# Patient Record
Sex: Male | Born: 1975 | Race: Black or African American | Hispanic: No | Marital: Married | Smoking: Never smoker
Health system: Southern US, Community
[De-identification: ages and names within clinical notes are randomized; demographics above are authoritative.]

## PROBLEM LIST (undated history)

## (undated) DIAGNOSIS — R569 Unspecified convulsions: Secondary | ICD-10-CM

## (undated) DIAGNOSIS — K219 Gastro-esophageal reflux disease without esophagitis: Secondary | ICD-10-CM

## (undated) DIAGNOSIS — C801 Malignant (primary) neoplasm, unspecified: Secondary | ICD-10-CM

## (undated) DIAGNOSIS — F1010 Alcohol abuse, uncomplicated: Secondary | ICD-10-CM

## (undated) HISTORY — PX: COLON SURGERY: SHX602

## (undated) HISTORY — PX: NO PAST SURGERIES: SHX2092

---

## 2002-02-23 ENCOUNTER — Emergency Department (HOSPITAL_COMMUNITY): Admission: EM | Admit: 2002-02-23 | Discharge: 2002-02-23 | Payer: Self-pay | Admitting: Emergency Medicine

## 2012-05-24 ENCOUNTER — Emergency Department (HOSPITAL_BASED_OUTPATIENT_CLINIC_OR_DEPARTMENT_OTHER)
Admission: EM | Admit: 2012-05-24 | Discharge: 2012-05-24 | Disposition: A | Discharge status: Home / Self Care | Payer: Self-pay | Attending: Emergency Medicine | Admitting: Emergency Medicine

## 2012-05-24 ENCOUNTER — Encounter (HOSPITAL_BASED_OUTPATIENT_CLINIC_OR_DEPARTMENT_OTHER): Payer: Self-pay | Admitting: *Deleted

## 2012-05-24 DIAGNOSIS — K648 Other hemorrhoids: Secondary | ICD-10-CM | POA: Insufficient documentation

## 2012-05-24 DIAGNOSIS — K644 Residual hemorrhoidal skin tags: Secondary | ICD-10-CM | POA: Insufficient documentation

## 2012-05-24 MED ORDER — HYDROCORTISONE 2.5 % RE CREA: TOPICAL_CREAM | RECTAL | Status: DC

## 2012-05-24 NOTE — ED Provider Notes (Signed)
History   This chart was scribed for Joseph David Smitty Cords, MD by Sofie Rower. The patient was seen in room MH01/MH01 and the patient's care was started at 7:11PM.    CSN: [id number]  Arrival date & time 05/24/12  1847   First MD Initiated Contact with Patient 05/24/12 1911      Chief Complaint  Patient presents with  . Rectal Pain    (Consider location/radiation/quality/duration/timing/severity/associated sxs/prior treatment) The history is provided by the patient. No language interpreter was used.    Joseph Haney is a 36 y.o. male , with a hx of Hemorids ,who presents to the Emergency Department complaining of sudden, progressively worsening, rectal pain, onset six days ago,  with associated symptoms of itching and burning at the rectum. The pt reports his stools have been soft, however, he can still feel them as he experiences a bowel movement. Modifying factors include application of vick's vapor rub which intensifies the rectal pain. The pt denies any other medical problems at present. No abdominal pain.  No CP, SOB n/v/d.  No rashes on the skin.  No bleeding.    The pt does not smoke, however, he does drink alcohol.      History reviewed. No pertinent past medical history.  History reviewed. No pertinent past surgical history.  No family history on file.  History  Substance Use Topics  . Smoking status: Never Smoker   . Smokeless tobacco: Not on file  . Alcohol Use: Yes      Review of Systems  Respiratory: Negative for shortness of breath.   Gastrointestinal: Positive for rectal pain. Negative for abdominal pain and diarrhea.  Skin: Negative for rash and wound.  All other systems reviewed and are negative.    Allergies  Review of patient's allergies indicates no known allergies.  Home Medications  No current outpatient prescriptions on file.  BP 147/106  Pulse 108  Temp 98.5 F (36.9 C) (Oral)  Resp 18  SpO2 100%  Physical Exam  Nursing note and  vitals reviewed. Constitutional: He is oriented to person, place, and time. He appears well-developed and well-nourished.  HENT:  Head: Atraumatic.  Right Ear: External ear normal.  Left Ear: External ear normal.  Nose: Nose normal.  Eyes: Conjunctivae normal are normal. Pupils are equal, round, and reactive to light.  Neck: Normal range of motion.  Cardiovascular: Normal rate, regular rhythm and normal heart sounds.   Pulmonary/Chest: Effort normal and breath sounds normal.  Abdominal: Soft. Bowel sounds are normal.  Genitourinary:       3 Hemihidrosis present. Non thrombosed. 1 Internal, 2 external.   Musculoskeletal: Normal range of motion.  Neurological: He is alert and oriented to person, place, and time. He has normal reflexes.  Skin: Skin is warm and dry.  Psychiatric: He has a normal mood and affect. His behavior is normal.    ED Course  Procedures (including critical care time)  DIAGNOSTIC STUDIES: Oxygen Saturation is 100% on room air, normal by my interpretation.    COORDINATION OF CARE:    7:34PM- Application of Hemorid cream discussed with patient. Pt agrees to treatment.   Labs Reviewed - No data to display No results found.   No diagnosis found.    MDM  Hemorrhoids non thrombosed will start treatment with anusol and colace.  Follow up with your PMD patient verbalizes understanding and agrees to follow up      I personally performed the services described in this documentation, which was scribed in  my presence. The recorded information has been reviewed and considered.    Jasmine Awe, MD 05/24/12 1956

## 2012-05-24 NOTE — ED Notes (Signed)
Noted small Hemorrhoids

## 2012-05-24 NOTE — ED Notes (Addendum)
Rectal burning. States he has hemorrhoids. He was using prep H with some relief and then his friend told him to use Vicks vapor rub on them. The rectal burning started after he used the Vicks.

## 2012-07-25 ENCOUNTER — Emergency Department (HOSPITAL_COMMUNITY): Payer: Self-pay

## 2012-07-25 ENCOUNTER — Observation Stay (HOSPITAL_COMMUNITY)
Admission: EM | Admit: 2012-07-25 | Discharge: 2012-07-27 | Disposition: A | Discharge status: Home / Self Care | Payer: Self-pay | Attending: Internal Medicine | Admitting: Internal Medicine

## 2012-07-25 ENCOUNTER — Encounter (HOSPITAL_COMMUNITY): Payer: Self-pay | Admitting: Physical Medicine and Rehabilitation

## 2012-07-25 DIAGNOSIS — S01501A Unspecified open wound of lip, initial encounter: Secondary | ICD-10-CM | POA: Insufficient documentation

## 2012-07-25 DIAGNOSIS — F1110 Opioid abuse, uncomplicated: Secondary | ICD-10-CM | POA: Insufficient documentation

## 2012-07-25 DIAGNOSIS — F1020 Alcohol dependence, uncomplicated: Secondary | ICD-10-CM | POA: Insufficient documentation

## 2012-07-25 DIAGNOSIS — G40909 Epilepsy, unspecified, not intractable, without status epilepticus: Secondary | ICD-10-CM | POA: Diagnosis present

## 2012-07-25 DIAGNOSIS — M545 Low back pain, unspecified: Secondary | ICD-10-CM | POA: Insufficient documentation

## 2012-07-25 DIAGNOSIS — F17200 Nicotine dependence, unspecified, uncomplicated: Secondary | ICD-10-CM | POA: Insufficient documentation

## 2012-07-25 DIAGNOSIS — F1010 Alcohol abuse, uncomplicated: Secondary | ICD-10-CM

## 2012-07-25 DIAGNOSIS — F10939 Alcohol use, unspecified with withdrawal, unspecified: Principal | ICD-10-CM | POA: Insufficient documentation

## 2012-07-25 DIAGNOSIS — Y92009 Unspecified place in unspecified non-institutional (private) residence as the place of occurrence of the external cause: Secondary | ICD-10-CM | POA: Insufficient documentation

## 2012-07-25 DIAGNOSIS — F10239 Alcohol dependence with withdrawal, unspecified: Secondary | ICD-10-CM | POA: Insufficient documentation

## 2012-07-25 DIAGNOSIS — E876 Hypokalemia: Secondary | ICD-10-CM | POA: Insufficient documentation

## 2012-07-25 DIAGNOSIS — H1131 Conjunctival hemorrhage, right eye: Secondary | ICD-10-CM

## 2012-07-25 DIAGNOSIS — F1210 Cannabis abuse, uncomplicated: Secondary | ICD-10-CM | POA: Insufficient documentation

## 2012-07-25 DIAGNOSIS — W19XXXA Unspecified fall, initial encounter: Secondary | ICD-10-CM | POA: Insufficient documentation

## 2012-07-25 DIAGNOSIS — S0300XA Dislocation of jaw, unspecified side, initial encounter: Secondary | ICD-10-CM | POA: Diagnosis present

## 2012-07-25 DIAGNOSIS — E872 Acidosis, unspecified: Secondary | ICD-10-CM | POA: Insufficient documentation

## 2012-07-25 DIAGNOSIS — H1130 Conjunctival hemorrhage, unspecified eye: Secondary | ICD-10-CM | POA: Insufficient documentation

## 2012-07-25 DIAGNOSIS — R569 Unspecified convulsions: Secondary | ICD-10-CM | POA: Insufficient documentation

## 2012-07-25 LAB — COMPREHENSIVE METABOLIC PANEL
ALT: 84 U/L — ABNORMAL HIGH (ref 0–53)
AST: 140 U/L — ABNORMAL HIGH (ref 0–37)
Alkaline Phosphatase: 73 U/L (ref 39–117)
CO2: 25 mEq/L (ref 19–32)
Calcium: 10 mg/dL (ref 8.4–10.5)
Chloride: 95 mEq/L — ABNORMAL LOW (ref 96–112)
GFR calc Af Amer: >90 mL/min (ref >90)
GFR calc non Af Amer: >90 mL/min (ref >90)
Glucose, Bld: 201 mg/dL — ABNORMAL HIGH (ref 70–99)
Potassium: 3.5 mEq/L (ref 3.5–5.1)
Sodium: 138 mEq/L (ref 135–145)

## 2012-07-25 LAB — POCT I-STAT, CHEM 8
BUN: 6 mg/dL (ref 6–23)
Calcium, Ion: 1.17 mmol/L (ref 1.12–1.23)
Chloride: 101 mEq/L (ref 96–112)
Creatinine, Ser: 0.9 mg/dL (ref 0.50–1.35)
Glucose, Bld: 195 mg/dL — ABNORMAL HIGH (ref 70–99)
TCO2: 25 mmol/L (ref 0–100)

## 2012-07-25 LAB — URINE MICROSCOPIC-ADD ON

## 2012-07-25 LAB — CREATININE, SERUM: Creatinine, Ser: 0.71 mg/dL (ref 0.50–1.35)

## 2012-07-25 LAB — RAPID URINE DRUG SCREEN, HOSP PERFORMED
Amphetamines: NOT DETECTED
Barbiturates: NOT DETECTED
Benzodiazepines: NOT DETECTED
Cocaine: NOT DETECTED
Opiates: POSITIVE — AB
Tetrahydrocannabinol: POSITIVE — AB

## 2012-07-25 LAB — CBC
Hemoglobin: 13.1 g/dL (ref 13.0–17.0)
Hemoglobin: 12.7 g/dL — ABNORMAL LOW (ref 13.0–17.0)
MCH: 32.8 pg (ref 26.0–34.0)
MCH: 32.6 pg (ref 26.0–34.0)
MCHC: 35.3 g/dL (ref 30.0–36.0)
MCHC: 35.8 g/dL (ref 30.0–36.0)
Platelets: 142 10*3/uL — ABNORMAL LOW (ref 150–400)
RDW: 15.7 % — ABNORMAL HIGH (ref 11.5–15.5)
RDW: 15.6 % — ABNORMAL HIGH (ref 11.5–15.5)

## 2012-07-25 LAB — URINALYSIS, ROUTINE W REFLEX MICROSCOPIC
Bilirubin Urine: NEGATIVE
Glucose, UA: 100 mg/dL — AB
Specific Gravity, Urine: 1.024 (ref 1.005–1.030)
Urobilinogen, UA: 1 mg/dL (ref 0.0–1.0)

## 2012-07-25 LAB — GLUCOSE, CAPILLARY

## 2012-07-25 MED ORDER — LORAZEPAM 1 MG PO TABS
0.0000 mg | ORAL_TABLET | Freq: Two times a day (BID) | ORAL | Status: DC
  Filled 2012-07-25: qty 1

## 2012-07-25 MED ORDER — ACETAMINOPHEN 325 MG PO TABS: 650.0000 mg | ORAL_TABLET | Freq: Four times a day (QID) | ORAL | Status: DC | PRN

## 2012-07-25 MED ORDER — SODIUM CHLORIDE 0.9 % IJ SOLN
3.0000 mL | Freq: Two times a day (BID) | INTRAMUSCULAR | Status: DC
  Administered 2012-07-26 – 2012-07-27 (×3): 3 mL via INTRAVENOUS

## 2012-07-25 MED ORDER — IBUPROFEN 400 MG PO TABS
600.0000 mg | ORAL_TABLET | Freq: Once | ORAL | Status: AC
  Administered 2012-07-25: 600 mg via ORAL
  Filled 2012-07-25: qty 2

## 2012-07-25 MED ORDER — LORAZEPAM 2 MG/ML IJ SOLN: 1.0000 mg | Freq: Four times a day (QID) | INTRAMUSCULAR | Status: DC | PRN

## 2012-07-25 MED ORDER — LORAZEPAM 1 MG PO TABS
0.0000 mg | ORAL_TABLET | Freq: Four times a day (QID) | ORAL | Status: AC
  Administered 2012-07-25 – 2012-07-27 (×7): 1 mg via ORAL
  Filled 2012-07-25 (×5): qty 1

## 2012-07-25 MED ORDER — LORAZEPAM 1 MG PO TABS
1.0000 mg | ORAL_TABLET | Freq: Four times a day (QID) | ORAL | Status: DC | PRN
  Filled 2012-07-25: qty 1

## 2012-07-25 MED ORDER — IBUPROFEN 800 MG PO TABS: 800.0000 mg | ORAL_TABLET | Freq: Once | ORAL | Status: DC

## 2012-07-25 MED ORDER — THIAMINE HCL 100 MG/ML IJ SOLN: 100.0000 mg | Freq: Every day | INTRAMUSCULAR | Status: DC

## 2012-07-25 MED ORDER — FOLIC ACID 1 MG PO TABS
1.0000 mg | ORAL_TABLET | Freq: Every day | ORAL | Status: DC
  Administered 2012-07-26 – 2012-07-27 (×2): 1 mg via ORAL
  Filled 2012-07-25 (×2): qty 1

## 2012-07-25 MED ORDER — DIAZEPAM 5 MG PO TABS
5.0000 mg | ORAL_TABLET | Freq: Once | ORAL | Status: AC
  Administered 2012-07-25: 5 mg via ORAL
  Filled 2012-07-25: qty 1

## 2012-07-25 MED ORDER — ADULT MULTIVITAMIN W/MINERALS CH
1.0000 | ORAL_TABLET | Freq: Every day | ORAL | Status: DC
  Administered 2012-07-26 – 2012-07-27 (×2): 1 via ORAL
  Filled 2012-07-25 (×2): qty 1

## 2012-07-25 MED ORDER — DOCUSATE SODIUM 100 MG PO CAPS
100.0000 mg | ORAL_CAPSULE | Freq: Two times a day (BID) | ORAL | Status: DC
  Administered 2012-07-26 (×2): 100 mg via ORAL
  Filled 2012-07-25 (×5): qty 1

## 2012-07-25 MED ORDER — VITAMIN B-1 100 MG PO TABS
100.0000 mg | ORAL_TABLET | Freq: Every day | ORAL | Status: DC
  Administered 2012-07-26 – 2012-07-27 (×2): 100 mg via ORAL
  Filled 2012-07-25 (×2): qty 1

## 2012-07-25 MED ORDER — ENOXAPARIN SODIUM 40 MG/0.4ML ~~LOC~~ SOLN
40.0000 mg | SUBCUTANEOUS | Status: DC
  Administered 2012-07-25 – 2012-07-26 (×2): 40 mg via SUBCUTANEOUS
  Filled 2012-07-25 (×3): qty 0.4

## 2012-07-25 MED ORDER — HYDROCODONE-ACETAMINOPHEN 5-325 MG PO TABS
1.0000 | ORAL_TABLET | ORAL | Status: DC | PRN
  Administered 2012-07-25 – 2012-07-27 (×5): 2 via ORAL
  Filled 2012-07-25 (×5): qty 2

## 2012-07-25 MED ORDER — ACETAMINOPHEN 650 MG RE SUPP: 650.0000 mg | Freq: Four times a day (QID) | RECTAL | Status: DC | PRN

## 2012-07-25 MED ORDER — THIAMINE HCL 100 MG/ML IJ SOLN
Freq: Once | INTRAVENOUS | Status: AC
  Administered 2012-07-25: 19:00:00 via INTRAVENOUS
  Filled 2012-07-25: qty 1000

## 2012-07-25 NOTE — ED Notes (Signed)
Pt in x-ray at the time. To be transported to unit when he returns.

## 2012-07-25 NOTE — ED Notes (Signed)
Pt presents to department via [facility] for evaluation of seizure. New onset seizure. Pt took OTC sleeping medication this morning, family heard patient yelling in bedroom, was found on floor having seizure. Bruising noted to L side of face. Postictal upon arrival to ED. BP 148/110. CBG 88. 20g LAC.

## 2012-07-25 NOTE — ED Notes (Signed)
Pt's CBG was 177 when I checked it.11:01am JG.

## 2012-07-25 NOTE — ED Notes (Signed)
Pt resting quietly at the time. No seizure activity noted. Vital signs stable. Family at bedside. No signs of distress noted at the time.

## 2012-07-25 NOTE — ED Notes (Signed)
Pt transported to CT ?

## 2012-07-25 NOTE — Consult Note (Signed)
NEURO HOSPITALIST CONSULT NOTE    Reason for Consult: Generalized seizure, most likely secondary to alcohol withdrawal.  HPI:                                                                                                                                          Joseph Haney is an 36 y.o. male with no known medical illness who was brought to the emergency room following a witnessed generalized seizure at home. 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 He stopped drinking 2 days prior to admission because he ran out of money. He suffered facial trauma but no fractures. CT scan of his head showed no intracranial abnormality. Isn't a recurrent seizure activity since admission. As no previous history of seizure activity as well. Family history is positive for febrile seizures but no other indication of seizure disorders. AST and ALT were elevated. Urine drug screen was positive for THC and opiates.  History reviewed. No pertinent past medical history.  History reviewed. No pertinent past surgical history.  Family History  Problem Relation Age of Onset  . Coronary artery disease Mother   . Coronary artery disease Father   . Heart attack Father 60     Social History:  reports that he has been smoking.  He does not have any smokeless tobacco history on file. He reports that he drinks alcohol. He reports that he uses illicit drugs (Marijuana) about once per week.  No Known Allergies  MEDICATIONS:                                                                                                                     Scheduled:   . [COMPLETED] diazepam  5 mg Oral Once  . docusate sodium  100 mg Oral BID  . enoxaparin (LOVENOX) injection  40 mg Subcutaneous Q24H  . folic acid  1 mg Oral Daily  . [COMPLETED] ibuprofen  600 mg Oral Once  . LORazepam  0-4 mg Oral Q6H   Followed by  . LORazepam  0-4 mg Oral Q12H  .  multivitamin with minerals  1 tablet Oral Daily  . [COMPLETED] general admission iv infusion   Intravenous Once  . sodium chloride  3 mL Intravenous Q12H  . thiamine  100 mg Oral Daily  . [DISCONTINUED] ibuprofen  800 mg Oral Once  . [DISCONTINUED] thiamine  100 mg Intravenous Daily   ZOX:[id number], acetaminophen, HYDROcodone-acetaminophen, LORazepam, LORazepam     Blood pressure 144/96, pulse 109, temperature 98.5 F (36.9 C), temperature source Oral, resp. rate 20, height 5\' 5"  (1.651 m), weight 62.279 kg (137 lb 4.8 oz), SpO2 100.00%.   Neurologic Examination:                                                                                                      Mental Status: Alert, oriented, thought content appropriate.  Speech fluent without evidence of aphasia. Able to follow commands without difficulty. Cranial Nerves: II-Visual fields were normal. III/IV/VI-Pupils were equal and reacted. Extraocular movements were full and conjugate.    V/VII-no facial numbness and no facial weakness. VIII-normal. X-normal speech and symmetrical palatal movement. XII-midline tongue extension Motor: 5/5 bilaterally with normal tone and bulk Sensory: Normal throughout. Deep Tendon Reflexes: 1+ and symmetric. Plantars: Flexor bilaterally Cerebellar: Normal finger-to-nose testing.  No results found for this basename: cbc, bmp, coags, chol, tri, ldl, hga1c    Results for orders placed during the hospital encounter of 07/25/12 (from the past 48 hour(s))  LACTIC ACID, PLASMA     Status: Abnormal   Collection Time   07/25/12 10:40 AM      Component Value Range Comment   Lactic Acid, Venous 6.5 (*) 0.5 - 2.2 mmol/L   URINALYSIS, ROUTINE W REFLEX MICROSCOPIC     Status: Abnormal   Collection Time   07/25/12 11:00 AM      Component Value Range Comment   Color, Urine AMBER (*) YELLOW BIOCHEMICALS MAY BE AFFECTED BY COLOR   APPearance CLOUDY (*) CLEAR    Specific Gravity, Urine 1.024   1.005 - 1.030    pH 6.5  5.0 - 8.0    Glucose, UA 100 (*) NEGATIVE mg/dL    Hgb urine dipstick SMALL (*) NEGATIVE    Bilirubin Urine NEGATIVE  NEGATIVE    Ketones, ur 15 (*) NEGATIVE mg/dL    Protein, ur >147 (*) NEGATIVE mg/dL    Urobilinogen, UA 1.0  0.0 - 1.0 mg/dL    Nitrite NEGATIVE  NEGATIVE    Leukocytes, UA NEGATIVE  NEGATIVE   URINE RAPID DRUG SCREEN (HOSP PERFORMED)     Status: Abnormal   Collection Time   07/25/12 11:00 AM      Component Value Range Comment   Opiates POSITIVE (*) NONE DETECTED    Cocaine NONE DETECTED  NONE DETECTED    Benzodiazepines NONE DETECTED  NONE DETECTED    Amphetamines NONE DETECTED  NONE DETECTED    Tetrahydrocannabinol POSITIVE (*) NONE DETECTED    Barbiturates NONE DETECTED  NONE DETECTED   URINE MICROSCOPIC-ADD ON     Status: Abnormal   Collection Time   07/25/12 11:00 AM      Component Value Range Comment   Squamous Epithelial / LPF RARE  RARE    RBC / HPF 3-6  <3 RBC/hpf  Casts HYALINE CASTS (*) NEGATIVE GRANULAR CAST   Urine-Other MUCOUS PRESENT     GLUCOSE, CAPILLARY     Status: Abnormal   Collection Time   07/25/12 11:02 AM      Component Value Range Comment   Glucose-Capillary 177 (*) 70 - 99 mg/dL    Comment 1 Notify RN     POCT I-STAT, CHEM 8     Status: Abnormal   Collection Time   07/25/12 11:07 AM      Component Value Range Comment   Sodium 138  135 - 145 mEq/L    Potassium 3.6  3.5 - 5.1 mEq/L    Chloride 101  96 - 112 mEq/L    BUN 6  6 - 23 mg/dL    Creatinine, Ser 1.47  0.50 - 1.35 mg/dL    Glucose, Bld 829 (*) 70 - 99 mg/dL    Calcium, Ion 5.62  1.30 - 1.23 mmol/L    TCO2 25  0 - 100 mmol/L    Hemoglobin 12.9 (*) 13.0 - 17.0 g/dL    HCT 86.5 (*) 78.4 - 52.0 %   COMPREHENSIVE METABOLIC PANEL     Status: Abnormal   Collection Time   07/25/12 12:58 PM      Component Value Range Comment   Sodium 138  135 - 145 mEq/L    Potassium 3.5  3.5 - 5.1 mEq/L    Chloride 95 (*) 96 - 112 mEq/L    CO2 25  19 - 32 mEq/L     Glucose, Bld 201 (*) 70 - 99 mg/dL    BUN 7  6 - 23 mg/dL    Creatinine, Ser 6.96  0.50 - 1.35 mg/dL    Calcium 29.5  8.4 - 10.5 mg/dL    Total Protein 8.4 (*) 6.0 - 8.3 g/dL    Albumin 4.4  3.5 - 5.2 g/dL    AST 284 (*) 0 - 37 U/L    ALT 84 (*) 0 - 53 U/L    Alkaline Phosphatase 73  39 - 117 U/L    Total Bilirubin 0.8  0.3 - 1.2 mg/dL    GFR calc non Af Amer >90  >90 mL/min    GFR calc Af Amer >90  >90 mL/min   CBC     Status: Abnormal   Collection Time   07/25/12 12:58 PM      Component Value Range Comment   WBC 5.6  4.0 - 10.5 K/uL    RBC 4.00 (*) 4.22 - 5.81 MIL/uL    Hemoglobin 13.1  13.0 - 17.0 g/dL    HCT 13.2 (*) 44.0 - 52.0 %    MCV 92.8  78.0 - 100.0 fL    MCH 32.8  26.0 - 34.0 pg    MCHC 35.3  30.0 - 36.0 g/dL    RDW 10.2 (*) 72.5 - 15.5 %    Platelets 151  150 - 400 K/uL   SALICYLATE LEVEL     Status: Abnormal   Collection Time   07/25/12  3:23 PM      Component Value Range Comment   Salicylate Lvl <2.0 (*) 2.8 - 20.0 mg/dL   CBC     Status: Abnormal   Collection Time   07/25/12  5:04 PM      Component Value Range Comment   WBC 7.5  4.0 - 10.5 K/uL    RBC 3.89 (*) 4.22 - 5.81 MIL/uL    Hemoglobin 12.7 (*) 13.0 - 17.0  g/dL    HCT 09.8 (*) 11.9 - 52.0 %    MCV 91.3  78.0 - 100.0 fL    MCH 32.6  26.0 - 34.0 pg    MCHC 35.8  30.0 - 36.0 g/dL    RDW 14.7 (*) 82.9 - 15.5 %    Platelets 142 (*) 150 - 400 K/uL   CREATININE, SERUM     Status: Normal   Collection Time   07/25/12  5:04 PM      Component Value Range Comment   Creatinine, Ser 0.71  0.50 - 1.35 mg/dL    GFR calc non Af Amer >90  >90 mL/min    GFR calc Af Amer >90  >90 mL/min     Dg Thoracic Spine 2 View  07/25/2012  *RADIOLOGY REPORT*  Clinical Data: Seizure with back pain.  THORACIC SPINE - 2 VIEW  Comparison:  None  Findings:  There is no evidence of thoracic spine fracture. Alignment is normal.  No other significant bone abnormalities are identified.  IMPRESSION: Negative.   Original Report  Authenticated By: Irish Lack, M.D.    Dg Lumbar Spine Complete  07/25/2012  *RADIOLOGY REPORT*  Clinical Data: Seizure, fall and low back pain.  LUMBAR SPINE - COMPLETE 4+ VIEW  Comparison: None.  Findings: No acute fracture or subluxation is identified.  No significant degenerative changes.  No bony lesions.  No soft tissue abnormalities.  IMPRESSION: Normal lumbar spine.   Original Report Authenticated By: Irish Lack, M.D.    Dg Pelvis 1-2 Views  07/25/2012  *RADIOLOGY REPORT*  Clinical Data: Fall with pain and low back/pelvis.  PELVIS - 1-2 VIEW  Comparison: None.  Findings: 2 AP views the pelvis. Femoral heads are located. Prominent acetabular osteophytes involve the weightbearing surface of the hips bilaterally. Sacroiliac joints are symmetric.  No acute fracture.  IMPRESSION: No acute osseous abnormality.   Original Report Authenticated By: Jeronimo Greaves, M.D.    Ct Head Wo Contrast  07/25/2012  *RADIOLOGY REPORT*  Clinical Data:  Seizure and left facial bruising.  CT HEAD WITHOUT CONTRAST CT MAXILLOFACIAL WITHOUT CONTRAST  Technique:  Multidetector CT imaging of the head and maxillofacial structures were performed using the standard protocol without intravenous contrast. Multiplanar CT image reconstructions of the maxillofacial structures were also generated.  Comparison:   None.  CT HEAD  Findings: The brain demonstrates no evidence of hemorrhage, infarction, edema, mass effect, extra-axial fluid collection, hydrocephalus or mass lesion.  The skull is unremarkable.  IMPRESSION: Normal head CT.  CT MAXILLOFACIAL  Findings:   No facial fractures are identified.  There is relative anterior positioning of the mandibular condyles at the level of the TMJ joints, especially on the left.  Correlation suggested with any pain or limitation of movement of the mandible.  No soft tissue abnormalities identified.  Paranasal sinuses and visualized mastoid air cells are normally aerated.  No evidence of  soft tissue hematoma or foreign body.  The visualized airway is normally patent.  IMPRESSION: No acute maxillofacial fractures are identified.  There is some suggestion of anterior subluxation of both mandibular condyles at the level of the temporomandibular joints.  This is more prominent on the left.  Recommend correlation with physical exam and range of motion.   Original Report Authenticated By: Irish Lack, M.D.    Ct Maxillofacial Wo Cm  07/25/2012  *RADIOLOGY REPORT*  Clinical Data:  Seizure and left facial bruising.  CT HEAD WITHOUT CONTRAST CT MAXILLOFACIAL WITHOUT CONTRAST  Technique:  Multidetector  CT imaging of the head and maxillofacial structures were performed using the standard protocol without intravenous contrast. Multiplanar CT image reconstructions of the maxillofacial structures were also generated.  Comparison:   None.  CT HEAD  Findings: The brain demonstrates no evidence of hemorrhage, infarction, edema, mass effect, extra-axial fluid collection, hydrocephalus or mass lesion.  The skull is unremarkable.  IMPRESSION: Normal head CT.  CT MAXILLOFACIAL  Findings:   No facial fractures are identified.  There is relative anterior positioning of the mandibular condyles at the level of the TMJ joints, especially on the left.  Correlation suggested with any pain or limitation of movement of the mandible.  No soft tissue abnormalities identified.  Paranasal sinuses and visualized mastoid air cells are normally aerated.  No evidence of soft tissue hematoma or foreign body.  The visualized airway is normally patent.  IMPRESSION: No acute maxillofacial fractures are identified.  There is some suggestion of anterior subluxation of both mandibular condyles at the level of the temporomandibular joints.  This is more prominent on the left.  Recommend correlation with physical exam and range of motion.   Original Report Authenticated By: Irish Lack, M.D.      Assessment/Plan: Generalized  seizure times one, most likely secondary to alcohol withdrawal. Patient's neurological evaluation at this point was unremarkable with no focal abnormalities and no mental status abnormalities. CT scan showed no signs of acute intracranial abnormality.  Recommendations: 1. Alcohol withdrawal precautions with use of benzodiazepines as necessary. 2. Routine EEG 3. No anticonvulsant medication unless patient has a recurrent witnessed seizure, or EEG shows indications of primary seizure disorder. 4. Alcohol rehabilitation program. 5. No further neurological intervention is indicated if patient does not have a recurrent seizure an EEG is unremarkable.  Venetia Maxon M.D. [facility] [phone]  07/25/2012, 8:52 PM

## 2012-07-25 NOTE — H&P (Signed)
Hospital Admission Note Date: 07/25/2012  Patient name: Joseph Haney Medical record number: [id number] Date of birth: [birth year] Age: 36 y.o. Gender: male PCP: No primary provider on file.  Medical Service: Internal Medicine Teaching Service  Attending physician:  Dr. Dalphine Handing    1st Contact:  Dr. Sherrine Maples   Pager: [phone] 2nd Contact:  Dr. Bosie Clos   Pager: [phone] After 5 pm or weekends: 1st Contact:      Pager: [phone] 2nd Contact:      Pager: [phone]  Chief Complaint: Seizures  History of Present Illness: Pt is a 36yo M w/ PMH EtOH abuse, drinking 1 pint of gin and a 40oz beer daily, who quit drinking 2 days ago because he ran out of money, presents to the ED with new onset seizures. He was postictal on arrival to the ED, which has improved since his arrival, and he was alert on exam. The pt reports that he took some OTC sleeping medication around 9am, and about 1 hour later he made a loud grunt, and his fiance found him face down on the floor seizing. EMS was called and transported the pt to the ED. The the last things he remembers before waking up with EMS over him at home was getting out of bed to walk into the living room. On arrival to the ED bruises were noted to the left side of his face as well as a right lateral subconjunctival hemorrhage, and small laceration above his mid-right lip.   Meds: Current Outpatient Rx  Name  Route  Sig  Dispense  Refill  . DOCUSATE SODIUM 100 MG PO CAPS   Oral   Take 100 mg by mouth daily.           Allergies: Allergies as of 07/25/2012  . (No Known Allergies)   No past medical history on file. No past surgical history on file. History reviewed. No pertinent family history. History   Social History  . Marital Status: Single    Spouse Name: N/A    Number of Children: N/A  . Years of Education: N/A   Occupational History  . Not on file.   Social History Main Topics  . Smoking status: Current Every Day Smoker    Types:  Cigarettes  . Smokeless tobacco: Not on file  . Alcohol Use: Yes  . Drug Use: No  . Sexually Active:    Other Topics Concern  . Not on file   Social History Narrative  . No narrative on file    Review of Systems: Constitutional: + diaphoresis. Denies fever, chills, appetite change or fatigue.  HEENT: Denies photophobia, hearing loss, ear pain, congestion, sore throat, rhinorrhea, sneezing, mouth sores.  Respiratory: Denies SOB, DOE, cough, chest tightness, or wheezing.  Cardiovascular: Denies chest pain, palpitations or leg swelling.  Gastrointestinal: Denies nausea, vomiting, abdominal pain, diarrhea, constipation. Genitourinary: Denies dysuria, urgency, frequency, flank pain or difficulty urinating.  Musculoskeletal: Denies myalgias, back pain, joint swelling, arthralgias or gait problem.  Skin: + bruise to left forehead.  Neurological: + seizure x1. Denies dizziness, light-headedness, numbness or headaches.  Psychiatric/Behavioral: Denies suicidal ideation, mood changes, confusion, nervousness, sleep disturbance or agitation   Physical Exam: Blood pressure 126/90, pulse 91, temperature 97.9 F (36.6 C), temperature source Oral, resp. rate 18, SpO2 97.00%. General: Well-developed, and cooperative on examination.  Head: Ecchymosis to left forehead, mild TTP at left temporomandibular joint. Normocephalic.  Eyes: Right lateral subconjunctival hemorrhage. EOMI. Mouth: Small laceration above upper lip at the mid-right side. Pharynx  pink and moist, no erythema or exudates.  Neck: Supple, full ROM Lungs: CTAB, normal respiratory effort, no accessory muscle use, no crackles, and no wheezes. Heart: Regular rate, regular rhythm, no murmur, no gallop, and no rub.  Abdomen: Soft, non-tender, non-distended Msk: No joint swelling, warmth, or erythema.  Extremities: 2+ radial and DP pulses bilaterally. No cyanosis, clubbing, edema Neurologic: Alert & oriented X3, cranial nerves II-XII  intact, strength normal in all extremities, sensation intact to light touch, and gait normal.  Skin: Turgor normal and no rashes.  Psych: Normally interactive, good eye contact, not anxious appearing, and not depressed appearing.   Lab results: Basic Metabolic Panel:  Basename 07/25/12 1107  NA 138  K 3.6  CL 101  CO2 --  GLUCOSE 195*  BUN 6  CREATININE 0.90  CALCIUM --  MG --  PHOS --   Liver Function Tests: No results found for this basename: AST:2,ALT:2,ALKPHOS:2,BILITOT:2,PROT:2,ALBUMIN:2 in the last 72 hours No results found for this basename: LIPASE:2,AMYLASE:2 in the last 72 hours No results found for this basename: AMMONIA:2 in the last 72 hours CBC:  Basename 07/25/12 1107  WBC --  NEUTROABS --  HGB 12.9*  HCT 38.0*  MCV --  PLT --   Cardiac Enzymes: No results found for this basename: CKTOTAL:3,CKMB:3,CKMBINDEX:3,TROPONINI:3 in the last 72 hours BNP: No results found for this basename: PROBNP:3 in the last 72 hours D-Dimer: No results found for this basename: DDIMER:2 in the last 72 hours CBG:  Basename 07/25/12 1102  GLUCAP 177*   Hemoglobin A1C: No results found for this basename: HGBA1C in the last 72 hours Fasting Lipid Panel: No results found for this basename: CHOL,HDL,LDLCALC,TRIG,CHOLHDL,LDLDIRECT in the last 72 hours Thyroid Function Tests: No results found for this basename: TSH,T4TOTAL,FREET4,T3FREE,THYROIDAB in the last 72 hours Anemia Panel: No results found for this basename: VITAMINB12,FOLATE,FERRITIN,TIBC,IRON,RETICCTPCT in the last 72 hours Coagulation: No results found for this basename: LABPROT:2,INR:2 in the last 72 hours Urine Drug Screen: Drugs of Abuse     Component Value Date/Time   LABOPIA POSITIVE* 07/25/2012 1100   COCAINSCRNUR NONE DETECTED 07/25/2012 1100   LABBENZ NONE DETECTED 07/25/2012 1100   AMPHETMU NONE DETECTED 07/25/2012 1100   THCU POSITIVE* 07/25/2012 1100   LABBARB NONE DETECTED 07/25/2012 1100      Alcohol Level: No results found for this basename: ETH:2 in the last 72 hours Urinalysis:  Basename 07/25/12 1100  COLORURINE AMBER*  LABSPEC 1.024  PHURINE 6.5  GLUCOSEU 100*  HGBUR SMALL*  BILIRUBINUR NEGATIVE  KETONESUR 15*  PROTEINUR >300*  UROBILINOGEN 1.0  NITRITE NEGATIVE  LEUKOCYTESUR NEGATIVE   Imaging results:  Dg Pelvis 1-2 Views  07/25/2012  *RADIOLOGY REPORT*  Clinical Data: Fall with pain and low back/pelvis.  PELVIS - 1-2 VIEW  Comparison: None.  Findings: 2 AP views the pelvis. Femoral heads are located. Prominent acetabular osteophytes involve the weightbearing surface of the hips bilaterally. Sacroiliac joints are symmetric.  No acute fracture.  IMPRESSION: No acute osseous abnormality.   Original Report Authenticated By: Jeronimo Greaves, M.D.    Ct Head Wo Contrast  07/25/2012  *RADIOLOGY REPORT*  Clinical Data:  Seizure and left facial bruising.  CT HEAD WITHOUT CONTRAST CT MAXILLOFACIAL WITHOUT CONTRAST  Technique:  Multidetector CT imaging of the head and maxillofacial structures were performed using the standard protocol without intravenous contrast. Multiplanar CT image reconstructions of the maxillofacial structures were also generated.  Comparison:   None.  CT HEAD  Findings: The brain demonstrates no evidence of hemorrhage, infarction,  edema, mass effect, extra-axial fluid collection, hydrocephalus or mass lesion.  The skull is unremarkable.  IMPRESSION: Normal head CT.  CT MAXILLOFACIAL  Findings:   No facial fractures are identified.  There is relative anterior positioning of the mandibular condyles at the level of the TMJ joints, especially on the left.  Correlation suggested with any pain or limitation of movement of the mandible.  No soft tissue abnormalities identified.  Paranasal sinuses and visualized mastoid air cells are normally aerated.  No evidence of soft tissue hematoma or foreign body.  The visualized airway is normally patent.  IMPRESSION: No acute  maxillofacial fractures are identified.  There is some suggestion of anterior subluxation of both mandibular condyles at the level of the temporomandibular joints.  This is more prominent on the left.  Recommend correlation with physical exam and range of motion.   Original Report Authenticated By: Irish Lack, M.D.    Ct Maxillofacial Wo Cm  07/25/2012  *RADIOLOGY REPORT*  Clinical Data:  Seizure and left facial bruising.  CT HEAD WITHOUT CONTRAST CT MAXILLOFACIAL WITHOUT CONTRAST  Technique:  Multidetector CT imaging of the head and maxillofacial structures were performed using the standard protocol without intravenous contrast. Multiplanar CT image reconstructions of the maxillofacial structures were also generated.  Comparison:   None.  CT HEAD  Findings: The brain demonstrates no evidence of hemorrhage, infarction, edema, mass effect, extra-axial fluid collection, hydrocephalus or mass lesion.  The skull is unremarkable.  IMPRESSION: Normal head CT.  CT MAXILLOFACIAL  Findings:   No facial fractures are identified.  There is relative anterior positioning of the mandibular condyles at the level of the TMJ joints, especially on the left.  Correlation suggested with any pain or limitation of movement of the mandible.  No soft tissue abnormalities identified.  Paranasal sinuses and visualized mastoid air cells are normally aerated.  No evidence of soft tissue hematoma or foreign body.  The visualized airway is normally patent.  IMPRESSION: No acute maxillofacial fractures are identified.  There is some suggestion of anterior subluxation of both mandibular condyles at the level of the temporomandibular joints.  This is more prominent on the left.  Recommend correlation with physical exam and range of motion.   Original Report Authenticated By: Irish Lack, M.D.     Other results: EKG: normal sinus rhythm   Assessment & Plan by Problem: 36 yo male with history significant for alcohol abuse and  without known seizure disorder admitted with new-onset seizure .  1. New-onset seizure: likely secondary to alcohol withdrawal given history of alcohol abuse and recent abrupt cessation, UDS positive for marijuana and opiates. No electrolyte abnormalities on iSTAT chem8; epilepsy (less likely given new onset), hypoglycemia  less likely given glucose level 177 on admission, CT of head without evidence of space-occupying lesion or stroke. -will check CBC for leukocytosis -check CMET for evidence of uremia -monitor on telemetry for possible cardiac arrhythmia -consult Neurology for further evaluation -consider EEG  Lab 07/25/12 1258  WBC 5.6    2. Metabolic acidosis with increased anion gap:  AG of 18 with Delta ration of 0.3 suggesting hyperchloremic normal anion gap acidosis, likely secondary to lactic acidemia in setting of acute seizure, alcoholic ketoacidosis likely concurrent component -will continue to monitor  CMP     Component Value Date/Time   NA 138 07/25/2012 1258   K 3.5 07/25/2012 1258   CL 95* 07/25/2012 1258   CO2 25 07/25/2012 1258   GLUCOSE 201* 07/25/2012 1258   BUN  7 07/25/2012 1258   CREATININE 0.80 07/25/2012 1258   CALCIUM 10.0 07/25/2012 1258   PROT 8.4* 07/25/2012 1258   ALBUMIN 4.4 07/25/2012 1258   AST 140* 07/25/2012 1258   ALT 84* 07/25/2012 1258   ALKPHOS 73 07/25/2012 1258   BILITOT 0.8 07/25/2012 1258   GFRNONAA >90 07/25/2012 1258   GFRAA >90 07/25/2012 1258      3. Right subconjunctiva hematoma: Secondary to trauma from seizure -no treatment needed  4. Facial contusion: CT of maxillary without acute facial fractures, possible anterior subluxation of both mandibular condyles at the level of the tempor-mandibular joints.  -prn Vicodin  5. Alcohol Abuse: received Valium 5 mg oral x1, pt requesting assistance with alcohol abuse, family supportive and at bedside -CIWA protocol with prn Ativan -consult Social Work  6. DVT ppx:  Lovenox    Dispo: Disposition is deferred at this time, awaiting improvement of current medical problems. Anticipated discharge in approximately 1-2 day(s).   The patient does not have a current PCP (No primary provider on file.), therefore will be followed by [facility].  The patient does not have transportation limitations that hinder transportation to clinic appointments.  Signed: Genelle Gather 07/25/2012, 12:56 PM

## 2012-07-26 ENCOUNTER — Observation Stay (HOSPITAL_COMMUNITY): Payer: Self-pay

## 2012-07-26 LAB — BASIC METABOLIC PANEL
BUN: 7 mg/dL (ref 6–23)
CO2: 24 mEq/L (ref 19–32)
Calcium: 9.2 mg/dL (ref 8.4–10.5)
Chloride: 99 mEq/L (ref 96–112)
Creatinine, Ser: 0.76 mg/dL (ref 0.50–1.35)
Creatinine, Ser: 0.71 mg/dL (ref 0.50–1.35)
GFR calc non Af Amer: >90 mL/min (ref >90)
Glucose, Bld: 94 mg/dL (ref 70–99)
Glucose, Bld: 91 mg/dL (ref 70–99)
Potassium: 3.1 mEq/L — ABNORMAL LOW (ref 3.5–5.1)

## 2012-07-26 LAB — HIV ANTIBODY (ROUTINE TESTING W REFLEX): HIV: NONREACTIVE

## 2012-07-26 LAB — CBC
Hemoglobin: 11.2 g/dL — ABNORMAL LOW (ref 13.0–17.0)
MCH: 32 pg (ref 26.0–34.0)
MCHC: 34.7 g/dL (ref 30.0–36.0)
RDW: 15.6 % — ABNORMAL HIGH (ref 11.5–15.5)

## 2012-07-26 MED ORDER — POTASSIUM CHLORIDE CRYS ER 20 MEQ PO TBCR
40.0000 meq | EXTENDED_RELEASE_TABLET | Freq: Once | ORAL | Status: AC
  Administered 2012-07-26: 40 meq via ORAL
  Filled 2012-07-26: qty 2

## 2012-07-26 MED ORDER — THIAMINE HCL 100 MG PO TABS: 100.0000 mg | ORAL_TABLET | Freq: Every day | ORAL | Status: DC

## 2012-07-26 MED ORDER — FOLIC ACID 1 MG PO TABS: 1.0000 mg | ORAL_TABLET | Freq: Every day | ORAL | Status: DC

## 2012-07-26 MED ORDER — ADULT MULTIVITAMIN W/MINERALS CH: 1.0000 | ORAL_TABLET | Freq: Every day | ORAL | Status: DC

## 2012-07-26 MED ORDER — MENTHOL 3 MG MT LOZG
1.0000 | LOZENGE | OROMUCOSAL | Status: DC | PRN
  Administered 2012-07-26 (×2): 3 mg via ORAL
  Filled 2012-07-26: qty 9

## 2012-07-26 NOTE — Progress Notes (Signed)
INITIAL ADULT NUTRITION ASSESSMENT Date: 07/26/2012   Time: 1:12 PM Reason for Assessment: MST (Malnutrition Screening Tool)  INTERVENTION: 1. Agree with regular diet. Expect improved intake with decreased Etoh  2. RD will continue to follow     DOCUMENTATION CODES Per approved criteria  -Not Applicable    ASSESSMENT: Male 36 y.o.  Dx: Alcohol withdrawal seizure  Hx: History reviewed. No pertinent past medical history.  History reviewed. No pertinent past surgical history.   Related Meds:     . docusate sodium  100 mg Oral BID  . enoxaparin (LOVENOX) injection  40 mg Subcutaneous Q24H  . folic acid  1 mg Oral Daily  . [COMPLETED] ibuprofen  600 mg Oral Once  . LORazepam  0-4 mg Oral Q6H   Followed by  . LORazepam  0-4 mg Oral Q12H  . multivitamin with minerals  1 tablet Oral Daily  . [COMPLETED] potassium chloride  40 mEq Oral Once  . [COMPLETED] general admission iv infusion   Intravenous Once  . sodium chloride  3 mL Intravenous Q12H  . thiamine  100 mg Oral Daily  . [DISCONTINUED] ibuprofen  800 mg Oral Once  . [DISCONTINUED] thiamine  100 mg Intravenous Daily     Ht: 5\' 5"  (165.1 cm)  Wt: 130 lb 8 oz (59.194 kg)  Ideal Wt: 62 kg  % Ideal Wt: 95%  Usual Wt: 160 lbs per pt report, in the past year % Usual Wt: 81%  Body mass index is 21.72 kg/(m^2). WNL   Food/Nutrition Related Hx: Pt reports decreased po intake and increased Etoh intake. Lost 30 lb in the past year.   Labs:  CMP     Component Value Date/Time   NA 137 07/26/2012 0650   K 3.1* 07/26/2012 0650   CL 99 07/26/2012 0650   CO2 26 07/26/2012 0650   GLUCOSE 94 07/26/2012 0650   BUN 7 07/26/2012 0650   CREATININE 0.76 07/26/2012 0650   CALCIUM 9.0 07/26/2012 0650   PROT 8.4* 07/25/2012 1258   ALBUMIN 4.4 07/25/2012 1258   AST 140* 07/25/2012 1258   ALT 84* 07/25/2012 1258   ALKPHOS 73 07/25/2012 1258   BILITOT 0.8 07/25/2012 1258   GFRNONAA >90 07/26/2012 0650   GFRAA >90  07/26/2012 0650   Sodium  Date/Time Value Range Status  07/26/2012  6:50 AM 137  135 - 145 mEq/L Final  07/25/2012 12:58 PM 138  135 - 145 mEq/L Final  07/25/2012 11:07 AM 138  135 - 145 mEq/L Final    Potassium  Date/Time Value Range Status  07/26/2012  6:50 AM 3.1* 3.5 - 5.1 mEq/L Final  07/25/2012 12:58 PM 3.5  3.5 - 5.1 mEq/L Final  07/25/2012 11:07 AM 3.6  3.5 - 5.1 mEq/L Final    No results found for this basename: phos    No results found for this basename: mg        Intake/Output Summary (Last 24 hours) at 07/26/12 1315 Last data filed at 07/26/12 0800  Gross per 24 hour  Intake 1168.75 ml  Output      0 ml  Net 1168.75 ml      Diet Order: General  Supplements/Tube Feeding: none   IVF:    Estimated Nutritional Needs:   Kcal: 1900-2100 Protein: 60-70 gm  Fluid: 1.9-2.1 L    Pt reports that he has been only eating a few bites of foods and drinking more then one pint of Etoh daily.  Pt states he has lost  30 lbs (19% body weight) in the past year. Not significant.  States that he has not been able to eat yet today r/t a sore throat.   Expect improved po intake with decrease Etoh intake. Current weight is appropriate for pt height. Recommend weight maintenance.   Pt with weight loss and increased Etoh, pt is at increased risk for refeeding syndrome. Recommend monitoring Mag and Phos x 3 days.   NUTRITION DIAGNOSIS: Inadequate oral intake related to alcohol abuse as evidenced by weight loss.    MONITORING/EVALUATION(Goals): Goal: PO intake to meet >/=90% estimated nutrition needs  Monitor: PO intake, weight, labs, I/O's  EDUCATION NEEDS: -No education needs identified at this time   Clarene Duke RD, LDN Pager [phone] After Hours pager [phone]  07/26/2012, 1:12 PM

## 2012-07-26 NOTE — Progress Notes (Signed)
Subjective: No further seizures noted.  Awake and alert.  Continues to report no prior history of seizures.  Objective: Current vital signs: BP 131/87  Pulse 99  Temp 97.7 F (36.5 C) (Oral)  Resp 18  Ht 5\' 5"  (1.651 m)  Wt 59.194 kg (130 lb 8 oz)  BMI 21.72 kg/m2  SpO2 100% Vital signs in last 24 hours: Temp:  [97.7 F (36.5 C)-98.8 F (37.1 C)] 97.7 F (36.5 C) (11/25 0500) Pulse Rate:  [63-111] 99  (11/25 0800) Resp:  [18-20] 18  (11/25 0500) BP: (126-144)/(85-97) 131/87 mmHg (11/25 0500) SpO2:  [96 %-100 %] 100 % (11/25 0500) Weight:  [59.194 kg (130 lb 8 oz)-62.279 kg (137 lb 4.8 oz)] 59.194 kg (130 lb 8 oz) (11/25 0500)  Intake/Output from previous day: 11/24 0701 - 11/25 0700 In: 1168.8 [I.V.:1168.8] Out: -  Intake/Output this shift:   Nutritional status: NPO  Neurologic Exam: Mental Status: Alert, oriented, thought content appropriate.  Speech fluent without evidence of aphasia.  Able to follow 3 step commands without difficulty. Cranial Nerves: II: Discs flat bilaterally; Visual fields grossly normal, pupils equal, round, reactive to light and accommodation III,IV, VI: ptosis not present, extra-ocular motions intact bilaterally V,VII: smile symmetric, facial light touch sensation normal bilaterally VIII: hearing normal bilaterally IX,X: gag reflex present XI: bilateral shoulder shrug XII: midline tongue extension.  Tongue bite on left side of tongue Motor: Right : Upper extremity   5/5    Left:     Upper extremity   5/5  Lower extremity   5/5     Lower extremity   5/5 Tone and bulk:normal tone throughout; no atrophy noted.  Tremulous. Sensory: Pinprick and light touch intact throughout, bilaterally Deep Tendon Reflexes: 1+ and symmetric  Plantars: Right: downgoing   Left: downgoing Cerebellar: normal finger-to-nose and normal heel-to-shin test Gait: normal gait and station CV: pulses palpable throughout     Lab Results: Basic Metabolic  Panel:  Lab 07/26/12 0650 07/25/12 1704 07/25/12 1258 07/25/12 1107  NA 137 -- 138 138  K 3.1* -- 3.5 3.6  CL 99 -- 95* 101  CO2 26 -- 25 --  GLUCOSE 94 -- 201* 195*  BUN 7 -- 7 6  CREATININE 0.76 0.71 0.80 0.90  CALCIUM 9.0 -- 10.0 --  MG -- -- -- --  PHOS -- -- -- --    Liver Function Tests:  Lab 07/25/12 1258  AST 140*  ALT 84*  ALKPHOS 73  BILITOT 0.8  PROT 8.4*  ALBUMIN 4.4   No results found for this basename: LIPASE:5,AMYLASE:5 in the last 168 hours No results found for this basename: AMMONIA:3 in the last 168 hours  CBC:  Lab 07/26/12 0650 07/25/12 1704 07/25/12 1258 07/25/12 1107  WBC 4.4 7.5 5.6 --  NEUTROABS -- -- -- --  HGB 11.2* 12.7* 13.1 12.9*  HCT 32.3* 35.5* 37.1* 38.0*  MCV 92.3 91.3 92.8 --  PLT 129* 142* 151 --    Cardiac Enzymes: No results found for this basename: CKTOTAL:5,CKMB:5,CKMBINDEX:5,TROPONINI:5 in the last 168 hours  Lipid Panel: No results found for this basename: CHOL:5,TRIG:5,HDL:5,CHOLHDL:5,VLDL:5,LDLCALC:5 in the last 168 hours  CBG:  Lab 07/25/12 1102  GLUCAP 177*    Microbiology: No results found for this or any previous visit.  Coagulation Studies: No results found for this basename: LABPROT:5,INR:5 in the last 72 hours  Imaging: Dg Thoracic Spine 2 View  07/25/2012  *RADIOLOGY REPORT*  Clinical Data: Seizure with back pain.  THORACIC SPINE - 2  VIEW  Comparison:  None  Findings:  There is no evidence of thoracic spine fracture. Alignment is normal.  No other significant bone abnormalities are identified.  IMPRESSION: Negative.   Original Report Authenticated By: Irish Lack, M.D.    Dg Lumbar Spine Complete  07/25/2012  *RADIOLOGY REPORT*  Clinical Data: Seizure, fall and low back pain.  LUMBAR SPINE - COMPLETE 4+ VIEW  Comparison: None.  Findings: No acute fracture or subluxation is identified.  No significant degenerative changes.  No bony lesions.  No soft tissue abnormalities.  IMPRESSION: Normal lumbar  spine.   Original Report Authenticated By: Irish Lack, M.D.    Dg Pelvis 1-2 Views  07/25/2012  *RADIOLOGY REPORT*  Clinical Data: Fall with pain and low back/pelvis.  PELVIS - 1-2 VIEW  Comparison: None.  Findings: 2 AP views the pelvis. Femoral heads are located. Prominent acetabular osteophytes involve the weightbearing surface of the hips bilaterally. Sacroiliac joints are symmetric.  No acute fracture.  IMPRESSION: No acute osseous abnormality.   Original Report Authenticated By: Jeronimo Greaves, M.D.    Ct Head Wo Contrast  07/25/2012  *RADIOLOGY REPORT*  Clinical Data:  Seizure and left facial bruising.  CT HEAD WITHOUT CONTRAST CT MAXILLOFACIAL WITHOUT CONTRAST  Technique:  Multidetector CT imaging of the head and maxillofacial structures were performed using the standard protocol without intravenous contrast. Multiplanar CT image reconstructions of the maxillofacial structures were also generated.  Comparison:   None.  CT HEAD  Findings: The brain demonstrates no evidence of hemorrhage, infarction, edema, mass effect, extra-axial fluid collection, hydrocephalus or mass lesion.  The skull is unremarkable.  IMPRESSION: Normal head CT.  CT MAXILLOFACIAL  Findings:   No facial fractures are identified.  There is relative anterior positioning of the mandibular condyles at the level of the TMJ joints, especially on the left.  Correlation suggested with any pain or limitation of movement of the mandible.  No soft tissue abnormalities identified.  Paranasal sinuses and visualized mastoid air cells are normally aerated.  No evidence of soft tissue hematoma or foreign body.  The visualized airway is normally patent.  IMPRESSION: No acute maxillofacial fractures are identified.  There is some suggestion of anterior subluxation of both mandibular condyles at the level of the temporomandibular joints.  This is more prominent on the left.  Recommend correlation with physical exam and range of motion.   Original  Report Authenticated By: Irish Lack, M.D.    Ct Maxillofacial Wo Cm  07/25/2012  *RADIOLOGY REPORT*  Clinical Data:  Seizure and left facial bruising.  CT HEAD WITHOUT CONTRAST CT MAXILLOFACIAL WITHOUT CONTRAST  Technique:  Multidetector CT imaging of the head and maxillofacial structures were performed using the standard protocol without intravenous contrast. Multiplanar CT image reconstructions of the maxillofacial structures were also generated.  Comparison:   None.  CT HEAD  Findings: The brain demonstrates no evidence of hemorrhage, infarction, edema, mass effect, extra-axial fluid collection, hydrocephalus or mass lesion.  The skull is unremarkable.  IMPRESSION: Normal head CT.  CT MAXILLOFACIAL  Findings:   No facial fractures are identified.  There is relative anterior positioning of the mandibular condyles at the level of the TMJ joints, especially on the left.  Correlation suggested with any pain or limitation of movement of the mandible.  No soft tissue abnormalities identified.  Paranasal sinuses and visualized mastoid air cells are normally aerated.  No evidence of soft tissue hematoma or foreign body.  The visualized airway is normally patent.  IMPRESSION: No  acute maxillofacial fractures are identified.  There is some suggestion of anterior subluxation of both mandibular condyles at the level of the temporomandibular joints.  This is more prominent on the left.  Recommend correlation with physical exam and range of motion.   Original Report Authenticated By: Irish Lack, M.D.     Medications:  I have reviewed the patient's current medications. Scheduled:   . [COMPLETED] diazepam  5 mg Oral Once  . docusate sodium  100 mg Oral BID  . enoxaparin (LOVENOX) injection  40 mg Subcutaneous Q24H  . folic acid  1 mg Oral Daily  . [COMPLETED] ibuprofen  600 mg Oral Once  . LORazepam  0-4 mg Oral Q6H   Followed by  . LORazepam  0-4 mg Oral Q12H  . multivitamin with minerals  1 tablet  Oral Daily  . [COMPLETED] general admission iv infusion   Intravenous Once  . sodium chloride  3 mL Intravenous Q12H  . thiamine  100 mg Oral Daily  . [DISCONTINUED] ibuprofen  800 mg Oral Once  . [DISCONTINUED] thiamine  100 mg Intravenous Daily    Assessment/Plan:  Patient Active Hospital Problem List: Alcohol withdrawal seizure (07/25/2012)   Assessment: No further seizures noted.  CT unremarkable.     Plan:  1.  EEG pending.  If unremarkable would not start AED's.      LOS: 1 day   Thana Farr, MD [facility] [phone] 07/26/2012  9:01 AM

## 2012-07-26 NOTE — Progress Notes (Addendum)
Subjective: Pt states that he is feeling better but does feel a bit "jittery"  Objective: Vital signs in last 24 hours: Filed Vitals:   07/25/12 2217 07/26/12 0436 07/26/12 0500 07/26/12 0800  BP: 130/86  131/87   Pulse: 101 87 106 99  Temp:   97.7 F (36.5 C)   TempSrc:   Oral   Resp:   18   Height:      Weight:   130 lb 8 oz (59.194 kg)   SpO2:   100%    Weight change:   Intake/Output Summary (Last 24 hours) at 07/26/12 1003 Last data filed at 07/26/12 0400  Gross per 24 hour  Intake 1168.75 ml  Output      0 ml  Net 1168.75 ml   Vitals reviewed. General: resting in bed, NAD HEENT: Ecchymosis to left forehead, mild TTP at left temporomandibular joint, Right lateral subconjunctival hemorrhage, EOMI,  Cardiac: RRR, no rubs, murmurs or gallops Pulm: clear to auscultation bilaterally, no wheezes, rales, or rhonchi Abd: soft, TTP in RLQ and lower mid abdomen, nondistended, no organomegaly or masses Ext: Warm and well perfused, no pedal edema Neuro: Alert and oriented X3, non focal  Lab Results: Basic Metabolic Panel:  Lab 07/26/12 1610 07/25/12 1704 07/25/12 1258  NA 137 -- 138  K 3.1* -- 3.5  CL 99 -- 95*  CO2 26 -- 25  GLUCOSE 94 -- 201*  BUN 7 -- 7  CREATININE 0.76 0.71 --  CALCIUM 9.0 -- 10.0  MG -- -- --  PHOS -- -- --   Liver Function Tests:  Lab 07/25/12 1258  AST 140*  ALT 84*  ALKPHOS 73  BILITOT 0.8  PROT 8.4*  ALBUMIN 4.4   CBC:  Lab 07/26/12 0650 07/25/12 1704  WBC 4.4 7.5  NEUTROABS -- --  HGB 11.2* 12.7*  HCT 32.3* 35.5*  MCV 92.3 91.3  PLT 129* 142*   CBG:  Lab 07/25/12 1102  GLUCAP 177*   Urine Drug Screen: Drugs of Abuse     Component Value Date/Time   LABOPIA POSITIVE* 07/25/2012 1100   COCAINSCRNUR NONE DETECTED 07/25/2012 1100   LABBENZ NONE DETECTED 07/25/2012 1100   AMPHETMU NONE DETECTED 07/25/2012 1100   THCU POSITIVE* 07/25/2012 1100   LABBARB NONE DETECTED 07/25/2012 1100    Urinalysis:  Lab 07/25/12 1100    COLORURINE AMBER*  LABSPEC 1.024  PHURINE 6.5  GLUCOSEU 100*  HGBUR SMALL*  BILIRUBINUR NEGATIVE  KETONESUR 15*  PROTEINUR >300*  UROBILINOGEN 1.0  NITRITE NEGATIVE  LEUKOCYTESUR NEGATIVE    Studies/Results: Dg Thoracic Spine 2 View  07/25/2012  *RADIOLOGY REPORT*  Clinical Data: Seizure with back pain.  THORACIC SPINE - 2 VIEW  Comparison:  None  Findings:  There is no evidence of thoracic spine fracture. Alignment is normal.  No other significant bone abnormalities are identified.  IMPRESSION: Negative.   Original Report Authenticated By: Irish Lack, M.D.    Dg Lumbar Spine Complete  07/25/2012  *RADIOLOGY REPORT*  Clinical Data: Seizure, fall and low back pain.  LUMBAR SPINE - COMPLETE 4+ VIEW  Comparison: None.  Findings: No acute fracture or subluxation is identified.  No significant degenerative changes.  No bony lesions.  No soft tissue abnormalities.  IMPRESSION: Normal lumbar spine.   Original Report Authenticated By: Irish Lack, M.D.    Dg Pelvis 1-2 Views  07/25/2012  *RADIOLOGY REPORT*  Clinical Data: Fall with pain and low back/pelvis.  PELVIS - 1-2 VIEW  Comparison: None.  Findings:  2 AP views the pelvis. Femoral heads are located. Prominent acetabular osteophytes involve the weightbearing surface of the hips bilaterally. Sacroiliac joints are symmetric.  No acute fracture.  IMPRESSION: No acute osseous abnormality.   Original Report Authenticated By: Jeronimo Greaves, M.D.    Ct Head Wo Contrast  07/25/2012  *RADIOLOGY REPORT*  Clinical Data:  Seizure and left facial bruising.  CT HEAD WITHOUT CONTRAST CT MAXILLOFACIAL WITHOUT CONTRAST  Technique:  Multidetector CT imaging of the head and maxillofacial structures were performed using the standard protocol without intravenous contrast. Multiplanar CT image reconstructions of the maxillofacial structures were also generated.  Comparison:   None.  CT HEAD  Findings: The brain demonstrates no evidence of hemorrhage,  infarction, edema, mass effect, extra-axial fluid collection, hydrocephalus or mass lesion.  The skull is unremarkable.  IMPRESSION: Normal head CT.  CT MAXILLOFACIAL  Findings:   No facial fractures are identified.  There is relative anterior positioning of the mandibular condyles at the level of the TMJ joints, especially on the left.  Correlation suggested with any pain or limitation of movement of the mandible.  No soft tissue abnormalities identified.  Paranasal sinuses and visualized mastoid air cells are normally aerated.  No evidence of soft tissue hematoma or foreign body.  The visualized airway is normally patent.  IMPRESSION: No acute maxillofacial fractures are identified.  There is some suggestion of anterior subluxation of both mandibular condyles at the level of the temporomandibular joints.  This is more prominent on the left.  Recommend correlation with physical exam and range of motion.   Original Report Authenticated By: Irish Lack, M.D.    Ct Maxillofacial Wo Cm  07/25/2012  *RADIOLOGY REPORT*  Clinical Data:  Seizure and left facial bruising.  CT HEAD WITHOUT CONTRAST CT MAXILLOFACIAL WITHOUT CONTRAST  Technique:  Multidetector CT imaging of the head and maxillofacial structures were performed using the standard protocol without intravenous contrast. Multiplanar CT image reconstructions of the maxillofacial structures were also generated.  Comparison:   None.  CT HEAD  Findings: The brain demonstrates no evidence of hemorrhage, infarction, edema, mass effect, extra-axial fluid collection, hydrocephalus or mass lesion.  The skull is unremarkable.  IMPRESSION: Normal head CT.  CT MAXILLOFACIAL  Findings:   No facial fractures are identified.  There is relative anterior positioning of the mandibular condyles at the level of the TMJ joints, especially on the left.  Correlation suggested with any pain or limitation of movement of the mandible.  No soft tissue abnormalities identified.   Paranasal sinuses and visualized mastoid air cells are normally aerated.  No evidence of soft tissue hematoma or foreign body.  The visualized airway is normally patent.  IMPRESSION: No acute maxillofacial fractures are identified.  There is some suggestion of anterior subluxation of both mandibular condyles at the level of the temporomandibular joints.  This is more prominent on the left.  Recommend correlation with physical exam and range of motion.   Original Report Authenticated By: Irish Lack, M.D.    Medications: I have reviewed the patient's current medications. Scheduled Meds:    . [COMPLETED] diazepam  5 mg Oral Once  . docusate sodium  100 mg Oral BID  . enoxaparin (LOVENOX) injection  40 mg Subcutaneous Q24H  . folic acid  1 mg Oral Daily  . [COMPLETED] ibuprofen  600 mg Oral Once  . LORazepam  0-4 mg Oral Q6H   Followed by  . LORazepam  0-4 mg Oral Q12H  . multivitamin with minerals  1 tablet Oral Daily  . potassium chloride  40 mEq Oral Once  . [COMPLETED] general admission iv infusion   Intravenous Once  . sodium chloride  3 mL Intravenous Q12H  . thiamine  100 mg Oral Daily  . [DISCONTINUED] ibuprofen  800 mg Oral Once  . [DISCONTINUED] thiamine  100 mg Intravenous Daily   Continuous Infusions:  PRN Meds:.acetaminophen, acetaminophen, HYDROcodone-acetaminophen, LORazepam, LORazepam  Assessment/Plan: 36 yo male with history significant for alcohol abuse and without known seizure disorder admitted with new-onset seizure.   1. New-onset seizure: Likely secondary to alcohol withdrawal given history of alcohol abuse and recent abrupt cessation, UDS positive for marijuana and opiates. Placed on tele at admission, and has remained in normal sinus rhythm. No leukocytosis, remains afebrile. Neurology was consulted and is performing an EEG today- if this is normal, will likely be able to d/c home.  - F/u with Neurology for further evaluation   2. Metabolic acidosis with  increased anion gap: On admission AG of 18 with Delta ration of 0.3 suggesting hyperchloremic normal anion gap acidosis, likely secondary to lactic acidemia in setting of acute seizure, alcoholic ketoacidosis also likely concurrent component. AG today improved to 14.  - Continue to monitor BMP  3. Right subconjunctiva hematoma: Secondary to trauma from seizure. No intervention needed   4. Facial contusion: Maxillofacial CT scan without acute facial fractures, possible anterior subluxation of both mandibular condyles at the level of the tempor-mandibular joints.  - Continue PRN Vicodin   5. Alcohol Abuse: Received Valium 5 mg oral x1, pt requesting assistance with alcohol abuse, family supportive and at bedside. Social Work consulted.  - CIWA protocol with prn Ativan  - F/u Social Work   6. Hypokalemia: Potassium levels normal on admission. On the day of discharge, his potassium was down to 3.1. Likely due to his initial NPO status and hydration with IVF. He was given oral potassium replacement.   7. DVT ppx: Lovenox   8. Dispo: Possible d/c today, pending EEG reading  Anticipated discharge in approximately 1 day(s).   The patient does not have a current PCP (No primary provider on file.), therefore will require follow-up at [facility] after discharge.   The patient does have transportation limitations that hinder transportation to clinic appointments.  .Services Needed at time of discharge: Y = Yes, Blank = No PT:   OT:   RN:   Equipment:   Other:     LOS: 1 day   Genelle Gather 07/26/2012, 10:03 AM

## 2012-07-26 NOTE — Clinical Social Work Psychosocial (Signed)
     Clinical Social Work Department BRIEF PSYCHOSOCIAL ASSESSMENT 07/26/2012  Patient:  Joseph Haney, Joseph Haney     Account Number:  [id number]     Admit date:  07/25/2012  Clinical Social Worker:  Margaree Mackintosh  Date/Time:  07/26/2012 12:00 M  Referred by:  Physician  Date Referred:  07/26/2012 Referred for  Substance Abuse   Other Referral:   Interview type:  Patient Other interview type:    PSYCHOSOCIAL DATA Living Status:  FAMILY Admitted from facility:   Level of care:   Primary support name:  Shirlean Mylar: [phone] Primary support relationship to patient:  FRIEND Degree of support available:   Unknown.    CURRENT CONCERNS Current Concerns  Substance Abuse   Other Concerns:    SOCIAL WORK ASSESSMENT / PLAN Clnical Social Worker recieved referral indicating pt requesting substance abuse referral information.  CSW met with pt.  CSW introduced self, explained role, and provided support.  CSW provided requested information and opportuntiy for pt to address questions/concerns.  No other questions/concerns addressed at this time.  CSW to sign off, please re consult if needed.   Assessment/plan status:  Information/Referral to [facility] Other assessment/ plan:   Information/referral to community resources:   Substance Abuse.    PATIENTS/FAMILYS RESPONSE TO PLAN OF CARE: Pt was polite and engaged.  Pt thanked CSW for intervention.

## 2012-07-26 NOTE — H&P (Signed)
Internal Medicine Teaching Service Attending Note Date: 07/26/2012  Patient name: Joseph Haney  Medical record number: [id number]  Date of birth: Jan 20, 1976   Chief Complaint: Seizure . History of Present Illness The patient, Joseph Haney, is a 36 y.o. year old male who comes in with the chief complaint of seizures, new onset. He drinks about a pint of gin and 2 40oz cans of beer every day for the past 13-14 years. He could not drink for 2 days because he did not have money and had a seizure, witnessed by his fiance. He fell down and lost consciousness. He denies chest pain, nausea, vomiting.  Last drink: (Now) 3 days back. He has never had such an episode before, has never de-toxed in the past.      Past Medical History   has no past medical history on file.  Medications  No meds.  Family History family history includes Coronary artery disease in his father and mother and Heart attack (age of onset:65) in his father.  Social History  reports that he has been smoking.  He does not have any smokeless tobacco history on file. He reports that he drinks alcohol. He reports that he uses illicit drugs (Marijuana) about once per week.  Review of Systems Positive for - seizures, tremors, headache, tongue pain, palpitations, dizziness. Negative for - chest pain,   Vital Signs: Filed Vitals:   07/26/12 1031  BP: 129/83  Pulse: 105  Temp:   Resp:    CIWA scale: 3--> 5-->6-->7 (increasing)  Physical Exam:  I met with patient around 10 am  Today. Fiance and Brother at bedside.   Vitals reviewed.   General: Resting in bed. HEENT: PERRL, EOMI, no scleral icterus, subconjunctival hemorrhage in right eye laterally, ecchymoses left forehead, tenderness at left TM joint, small wound tongue left side. Heart: RRR, no rubs, murmurs or gallops. Lungs: Clear to auscultation bilaterally, no wheezes, rales, or rhonchi. Abdomen: Soft, nontender, nondistended, BS present. Extremities:  Tremors of outstretched hands + Warm, no pedal edema. Neuro: Alert and oriented X3, cranial nerves II-XII grossly intact,  strength and sensation to light touch equal in bilateral upper and lower extremities  Lab results: CMP     Component Value Date/Time   NA 137 07/26/2012 0650   K 3.1* 07/26/2012 0650   CL 99 07/26/2012 0650   CO2 26 07/26/2012 0650   GLUCOSE 94 07/26/2012 0650   BUN 7 07/26/2012 0650   CREATININE 0.76 07/26/2012 0650   CALCIUM 9.0 07/26/2012 0650   PROT 8.4* 07/25/2012 1258   ALBUMIN 4.4 07/25/2012 1258   AST 140* 07/25/2012 1258   ALT 84* 07/25/2012 1258   ALKPHOS 73 07/25/2012 1258   BILITOT 0.8 07/25/2012 1258   GFRNONAA >90 07/26/2012 0650   GFRAA >90 07/26/2012 0650   CBC    Component Value Date/Time   WBC 4.4 07/26/2012 0650   RBC 3.50* 07/26/2012 0650   HGB 11.2* 07/26/2012 0650   HCT 32.3* 07/26/2012 0650   PLT 129* 07/26/2012 0650   MCV 92.3 07/26/2012 0650   MCH 32.0 07/26/2012 0650   MCHC 34.7 07/26/2012 0650   RDW 15.6* 07/26/2012 0650   Urinalysis    Component Value Date/Time   COLORURINE AMBER* 07/25/2012 1100   APPEARANCEUR CLOUDY* 07/25/2012 1100   LABSPEC 1.024 07/25/2012 1100   PHURINE 6.5 07/25/2012 1100   GLUCOSEU 100* 07/25/2012 1100   HGBUR SMALL* 07/25/2012 1100   BILIRUBINUR NEGATIVE 07/25/2012 1100   KETONESUR 15* 07/25/2012 1100  PROTEINUR >300* 07/25/2012 1100   UROBILINOGEN 1.0 07/25/2012 1100   NITRITE NEGATIVE 07/25/2012 1100   LEUKOCYTESUR NEGATIVE 07/25/2012 1100   Drugs of Abuse     Component Value Date/Time   LABOPIA POSITIVE* 07/25/2012 1100   COCAINSCRNUR NONE DETECTED 07/25/2012 1100   LABBENZ NONE DETECTED 07/25/2012 1100   AMPHETMU NONE DETECTED 07/25/2012 1100   THCU POSITIVE* 07/25/2012 1100   LABBARB NONE DETECTED 07/25/2012 1100       Imaging results:  Dg Thoracic Spine 2 View  07/25/2012  *RADIOLOGY REPORT*  Clinical Data: Seizure with back pain.  THORACIC SPINE - 2 VIEW   Comparison:  None  Findings:  There is no evidence of thoracic spine fracture. Alignment is normal.  No other significant bone abnormalities are identified.  IMPRESSION: Negative.   Original Report Authenticated By: Irish Lack, M.D.    Dg Lumbar Spine Complete  07/25/2012  *RADIOLOGY REPORT*  Clinical Data: Seizure, fall and low back pain.  LUMBAR SPINE - COMPLETE 4+ VIEW  Comparison: None.  Findings: No acute fracture or subluxation is identified.  No significant degenerative changes.  No bony lesions.  No soft tissue abnormalities.  IMPRESSION: Normal lumbar spine.   Original Report Authenticated By: Irish Lack, M.D.    Dg Pelvis 1-2 Views  07/25/2012  *RADIOLOGY REPORT*  Clinical Data: Fall with pain and low back/pelvis.  PELVIS - 1-2 VIEW  Comparison: None.  Findings: 2 AP views the pelvis. Femoral heads are located. Prominent acetabular osteophytes involve the weightbearing surface of the hips bilaterally. Sacroiliac joints are symmetric.  No acute fracture.  IMPRESSION: No acute osseous abnormality.   Original Report Authenticated By: Jeronimo Greaves, M.D.    Ct Head Wo Contrast  07/25/2012  *RADIOLOGY REPORT*  Clinical Data:  Seizure and left facial bruising.  CT HEAD WITHOUT CONTRAST CT MAXILLOFACIAL WITHOUT CONTRAST  Technique:  Multidetector CT imaging of the head and maxillofacial structures were performed using the standard protocol without intravenous contrast. Multiplanar CT image reconstructions of the maxillofacial structures were also generated.  Comparison:   None.  CT HEAD  Findings: The brain demonstrates no evidence of hemorrhage, infarction, edema, mass effect, extra-axial fluid collection, hydrocephalus or mass lesion.  The skull is unremarkable.  IMPRESSION: Normal head CT.  CT MAXILLOFACIAL  Findings:   No facial fractures are identified.  There is relative anterior positioning of the mandibular condyles at the level of the TMJ joints, especially on the left.  Correlation  suggested with any pain or limitation of movement of the mandible.  No soft tissue abnormalities identified.  Paranasal sinuses and visualized mastoid air cells are normally aerated.  No evidence of soft tissue hematoma or foreign body.  The visualized airway is normally patent.  IMPRESSION: No acute maxillofacial fractures are identified.  There is some suggestion of anterior subluxation of both mandibular condyles at the level of the temporomandibular joints.  This is more prominent on the left.  Recommend correlation with physical exam and range of motion.   Original Report Authenticated By: Irish Lack, M.D.    Ct Maxillofacial Wo Cm  07/25/2012  *RADIOLOGY REPORT*  Clinical Data:  Seizure and left facial bruising.  CT HEAD WITHOUT CONTRAST CT MAXILLOFACIAL WITHOUT CONTRAST  Technique:  Multidetector CT imaging of the head and maxillofacial structures were performed using the standard protocol without intravenous contrast. Multiplanar CT image reconstructions of the maxillofacial structures were also generated.  Comparison:   None.  CT HEAD  Findings: The brain demonstrates no evidence of  hemorrhage, infarction, edema, mass effect, extra-axial fluid collection, hydrocephalus or mass lesion.  The skull is unremarkable.  IMPRESSION: Normal head CT.  CT MAXILLOFACIAL  Findings:   No facial fractures are identified.  There is relative anterior positioning of the mandibular condyles at the level of the TMJ joints, especially on the left.  Correlation suggested with any pain or limitation of movement of the mandible.  No soft tissue abnormalities identified.  Paranasal sinuses and visualized mastoid air cells are normally aerated.  No evidence of soft tissue hematoma or foreign body.  The visualized airway is normally patent.  IMPRESSION: No acute maxillofacial fractures are identified.  There is some suggestion of anterior subluxation of both mandibular condyles at the level of the temporomandibular joints.   This is more prominent on the left.  Recommend correlation with physical exam and range of motion.   Original Report Authenticated By: Irish Lack, M.D.     Assessment and Plan:  I have discussed this case with my team and agree with their management.   1. Alcohol withdrawal with seizures: Unless proven otherwise, I would presume that this man suffered from withdrawal seizures. This is the fist time he has been without alcohol in 13-14 years, has never detoxed before. So far, his CT head does not show any significant finding related to seizures, and EEG is negative. He does not have a past medical history that I can attribute his seizures to.  We have kept him on CIWA scale and it is increasing. We will continue to monitor and give Ativan PRN. Monitor electrolytes. Check Magnesium in the setting of hypokalemia and alcoholism. Supplement with MagOx if needed. On Folic Acid and Multivitamin. On Seizure precautions. Wants to meet with social worker and motivated to work on leaving alcohol.   2. Other chronic issues per resident note.  Thanks, Aletta Edouard, MD 11/25/201311:56 AM

## 2012-07-26 NOTE — Discharge Summary (Signed)
Internal Medicine Teaching [facility] Discharge Note  Name: Joseph Haney MRN: [id number] DOB: Dec 14, 1975 36 y.o.  Date of Admission: 07/25/2012 10:30 AM Date of Discharge: 07/27/2012 Attending Physician:  Discharge Diagnosis: Principal Problem:  *Alcohol withdrawal seizure Active Problems:  Alcohol abuse  Temporomandibular subluxation  Conjunctival hemorrhage of right eye  Acute hypokalemia   Discharge Medications:   Medication List     As of 07/27/2012 11:29 AM    TAKE these medications         docusate sodium 100 MG capsule   Commonly known as: COLACE   Take 100 mg by mouth daily.      folic acid 1 MG tablet   Commonly known as: FOLVITE   Take 1 tablet (1 mg total) by mouth daily.      multivitamin with minerals Tabs   Take 1 tablet by mouth daily.      thiamine 100 MG tablet   Take 1 tablet (100 mg total) by mouth daily.        Disposition and follow-up:   Mr.Keyan Hoon was discharged from [facility] in Stable condition.  At the hospital follow up visit please address his alcohol cessation and any possible withdrawal symptoms. Also, please check a BMP to evaluate his potassium level and to make sure that his anion gap has closed. Please make sure that his subconjunctival hemorrhage is improving and that his vision remains unchanged. Also, prior to discharge from the hospital, Mr. Sacra complained of intermittent abdominal pain with vomiting that will occur; no occurrence this hospitalization. This should be addressed as an outpatient.  Follow-up Appointments: Follow-up Information    Follow up with [facility] (Please arrive to the Urgent Care Center on Wednesday, 07/28/12 for a hospital follow up.)    Contact information:   [address] [place] [address]         Discharge Orders    Future Orders Please Complete By Expires   Diet general      Activity as tolerated - No restrictions      Call MD  for:  persistant nausea and vomiting      Call MD for:  persistant dizziness or light-headedness      Call MD for:  temperature >100.4         Consultations:    Procedures Performed:  Dg Thoracic Spine 2 View  07/25/2012  *RADIOLOGY REPORT*  Clinical Data: Seizure with back pain.  THORACIC SPINE - 2 VIEW  Comparison:  None  Findings:  There is no evidence of thoracic spine fracture. Alignment is normal.  No other significant bone abnormalities are identified.  IMPRESSION: Negative.   Original Report Authenticated By: Irish Lack, M.D.    Dg Lumbar Spine Complete  07/25/2012  *RADIOLOGY REPORT*  Clinical Data: Seizure, fall and low back pain.  LUMBAR SPINE - COMPLETE 4+ VIEW  Comparison: None.  Findings: No acute fracture or subluxation is identified.  No significant degenerative changes.  No bony lesions.  No soft tissue abnormalities.  IMPRESSION: Normal lumbar spine.   Original Report Authenticated By: Irish Lack, M.D.    Dg Pelvis 1-2 Views  07/25/2012  *RADIOLOGY REPORT*  Clinical Data: Fall with pain and low back/pelvis.  PELVIS - 1-2 VIEW  Comparison: None.  Findings: 2 AP views the pelvis. Femoral heads are located. Prominent acetabular osteophytes involve the weightbearing surface of the hips bilaterally. Sacroiliac joints are symmetric.  No acute fracture.  IMPRESSION: No acute osseous  abnormality.   Original Report Authenticated By: Jeronimo Greaves, M.D.    Ct Head Wo Contrast  07/25/2012  *RADIOLOGY REPORT*  Clinical Data:  Seizure and left facial bruising.  CT HEAD WITHOUT CONTRAST CT MAXILLOFACIAL WITHOUT CONTRAST  Technique:  Multidetector CT imaging of the head and maxillofacial structures were performed using the standard protocol without intravenous contrast. Multiplanar CT image reconstructions of the maxillofacial structures were also generated.  Comparison:   None.  CT HEAD  Findings: The brain demonstrates no evidence of hemorrhage, infarction, edema, mass effect,  extra-axial fluid collection, hydrocephalus or mass lesion.  The skull is unremarkable.  IMPRESSION: Normal head CT.  CT MAXILLOFACIAL  Findings:   No facial fractures are identified.  There is relative anterior positioning of the mandibular condyles at the level of the TMJ joints, especially on the left.  Correlation suggested with any pain or limitation of movement of the mandible.  No soft tissue abnormalities identified.  Paranasal sinuses and visualized mastoid air cells are normally aerated.  No evidence of soft tissue hematoma or foreign body.  The visualized airway is normally patent.  IMPRESSION: No acute maxillofacial fractures are identified.  There is some suggestion of anterior subluxation of both mandibular condyles at the level of the temporomandibular joints.  This is more prominent on the left.  Recommend correlation with physical exam and range of motion.   Original Report Authenticated By: Irish Lack, M.D.    Ct Maxillofacial Wo Cm  07/25/2012  *RADIOLOGY REPORT*  Clinical Data:  Seizure and left facial bruising.  CT HEAD WITHOUT CONTRAST CT MAXILLOFACIAL WITHOUT CONTRAST  Technique:  Multidetector CT imaging of the head and maxillofacial structures were performed using the standard protocol without intravenous contrast. Multiplanar CT image reconstructions of the maxillofacial structures were also generated.  Comparison:   None.  CT HEAD  Findings: The brain demonstrates no evidence of hemorrhage, infarction, edema, mass effect, extra-axial fluid collection, hydrocephalus or mass lesion.  The skull is unremarkable.  IMPRESSION: Normal head CT.  CT MAXILLOFACIAL  Findings:   No facial fractures are identified.  There is relative anterior positioning of the mandibular condyles at the level of the TMJ joints, especially on the left.  Correlation suggested with any pain or limitation of movement of the mandible.  No soft tissue abnormalities identified.  Paranasal sinuses and visualized  mastoid air cells are normally aerated.  No evidence of soft tissue hematoma or foreign body.  The visualized airway is normally patent.  IMPRESSION: No acute maxillofacial fractures are identified.  There is some suggestion of anterior subluxation of both mandibular condyles at the level of the temporomandibular joints.  This is more prominent on the left.  Recommend correlation with physical exam and range of motion.   Original Report Authenticated By: Irish Lack, M.D.     Admission  History of Present Illness:  Pt is a 36yo M w/ PMH EtOH abuse, drinking 1 pint of gin and a 40oz beer daily, who quit drinking 2 days ago because he ran out of money, presents to the ED with new onset seizures. He was postictal on arrival to the ED, which has improved since his arrival, and he was alert on exam. The pt reports that he took some OTC sleeping medication around 9am, and about 1 hour later he made a loud grunt, and his fiance found him face down on the floor seizing. EMS was called and transported the pt to the ED. The the last things he remembers before  waking up with EMS over him at home was getting out of bed to walk into the living room. On arrival to the ED bruises were noted to the left side of his face as well as a right lateral subconjunctival hemorrhage, and small laceration above his mid-right lip.  Review of Systems:  Constitutional: + diaphoresis. Denies fever, chills, appetite change or fatigue.  HEENT: Denies photophobia, hearing loss, ear pain, congestion, sore throat, rhinorrhea, sneezing, mouth sores.  Respiratory: Denies SOB, DOE, cough, chest tightness, or wheezing.  Cardiovascular: Denies chest pain, palpitations or leg swelling.  Gastrointestinal: Denies nausea, vomiting, abdominal pain, diarrhea, constipation.  Genitourinary: Denies dysuria, urgency, frequency, flank pain or difficulty urinating.  Musculoskeletal: Denies myalgias, back pain, joint swelling, arthralgias or gait  problem.  Skin: + bruise to left forehead.  Neurological: + seizure x1. Denies dizziness, light-headedness, numbness or headaches.  Psychiatric/Behavioral: Denies suicidal ideation, mood changes, confusion, nervousness, sleep disturbance or agitation  Physical Exam:  Blood pressure 126/90, pulse 91, temperature 97.9 F (36.6 C), temperature source Oral, resp. rate 18, SpO2 97.00%.  General: Well-developed, and cooperative on examination.  Head: Ecchymosis to left forehead, mild TTP at left temporomandibular joint. Normocephalic.  Eyes: Right lateral subconjunctival hemorrhage. EOMI.  Mouth: Small laceration above upper lip at the mid-right side. Pharynx pink and moist, no erythema or exudates.  Neck: Supple, full ROM Lungs: CTAB, normal respiratory effort, no accessory muscle use, no crackles, and no wheezes. Heart: Regular rate, regular rhythm, no murmur, no gallop, and no rub.  Abdomen: Soft, non-tender, non-distended  Msk: No joint swelling, warmth, or erythema.  Extremities: 2+ radial and DP pulses bilaterally. No cyanosis, clubbing, edema Neurologic: Alert & oriented X3, cranial nerves II-XII intact, strength normal in all extremities, sensation intact to light touch, and gait normal.  Skin: Turgor normal and no rashes.  Psych: Normally interactive, good eye contact, not anxious appearing, and not depressed appearing  Hospital Course by problem list: 36 yo male with history significant for alcohol abuse and without known seizure disorder admitted with new-onset seizure.   1. Alcohol withdrawal seizure: Likely secondary to alcohol withdrawal given history of alcohol abuse and recent abrupt cessation, UDS was positive for marijuana and opiates. Epilepsy was considered less likely given new onset, hypoglycemia was less likely given elevated glucose to 177 on admission, and CT of head without evidence of space-occupying lesion or stroke. He was placed on telemetry on admission, and has  remained in normal sinus rhythm, except yesterday afternoon with 11 beats of Vtach, likely 2/2 withdrawal. Neurology performed an EEG which was normal, and he has had no more seizures. No leukocytosis, remains afebrile. Per Neurology, the pt is ready for discharge home.   2. Anion gap metabolic acidosis: On admission AG of 18 with Delta ratio of 0.3, likely secondary to lactic acidemia in setting of acute seizure also contributed by alcoholic ketoacidosis. AG at discharge was 14. This will need to be monitored as an outpatient.  3. Right subconjunctiva hematoma: Secondary to trauma from seizure. Lovenox was initiated on admission, but was stopped due to some expansion of the hematoma. No intervention needed at this time.   4. Facial contusion: Maxillofacial CT scan without acute facial fractures, possible anterior subluxation of both mandibular condyles at the level of the tempor-mandibular joints. Vicodin was started for pain control. His pain has improved since admission.  5. Alcohol Abuse: Received Valium 5 mg oral x1 in the ED. CIWA was initiated on admission, but has not required  any PRN doses of Ativan beyond his scheduled doses. On admission, the patient requested assistance with alcohol cessation; his family is supportive and his fiance has been present throughout this hospitalization. Social Work has discussed cessation with the patient and provided him with some community resources.   6. Hypokalemia: Potassium levels were normal on admission. On HD #2, his potassium dropped to 3.1, likely due to his initial NPO status and hydration with IVF. His Mg has been normal. He was given oral potassium replacement, and his potassium was up to 4.2 on the day of discharge. His potassium levels will need to be monitored as an outpatient.   Discharge Vitals:  BP 132/91  Pulse 94  Temp 98.8 F (37.1 C) (Oral)  Resp 16  Ht 5\' 5"  (1.651 m)  Wt 129 lb 11.2 oz (58.832 kg)  BMI 21.58 kg/m2  SpO2  100%  Discharge Labs:  Results for orders placed during the hospital encounter of 07/25/12 (from the past 24 hour(s))  MAGNESIUM     Status: Normal   Collection Time   07/26/12  3:11 PM      Component Value Range   Magnesium 1.8  1.5 - 2.5 mg/dL  BASIC METABOLIC PANEL     Status: Abnormal   Collection Time   07/26/12  3:11 PM      Component Value Range   Sodium 136  135 - 145 mEq/L   Potassium 3.4 (*) 3.5 - 5.1 mEq/L   Chloride 99  96 - 112 mEq/L   CO2 24  19 - 32 mEq/L   Glucose, Bld 91  70 - 99 mg/dL   BUN 5 (*) 6 - 23 mg/dL   Creatinine, Ser 3.24  0.50 - 1.35 mg/dL   Calcium 9.2  8.4 - 40.1 mg/dL   GFR calc non Af Amer >90  >90 mL/min   GFR calc Af Amer >90  >90 mL/min  BASIC METABOLIC PANEL     Status: Abnormal   Collection Time   07/27/12  6:17 AM      Component Value Range   Sodium 136  135 - 145 mEq/L   Potassium 4.2  3.5 - 5.1 mEq/L   Chloride 99  96 - 112 mEq/L   CO2 23  19 - 32 mEq/L   Glucose, Bld 82  70 - 99 mg/dL   BUN 5 (*) 6 - 23 mg/dL   Creatinine, Ser 0.27  0.50 - 1.35 mg/dL   Calcium 9.6  8.4 - 25.3 mg/dL   GFR calc non Af Amer >90  >90 mL/min   GFR calc Af Amer >90  >90 mL/min    Signed: Genelle Gather 07/27/2012, 11:29 AM   Time Spent on Discharge: 35 min  Services Ordered on Discharge: None Equipment Ordered on Discharge: None

## 2012-07-26 NOTE — Progress Notes (Signed)
EEG COMPLETED

## 2012-07-26 NOTE — Progress Notes (Signed)
Pt had 11 beat run of Vtach.  Pt asymptomatic, VSS. Dr. Sherrine Maples paged and made aware. Labs ordered, to follow with results. Will continue to monitor.

## 2012-07-26 NOTE — Procedures (Signed)
EEG NUMBER:  HISTORY:  A 36 year old male with a seizure activity and history of alcohol abuse.  MEDICATIONS:  Valium, Ativan, Advil, Norco, Folvite.  CONDITIONS OF RECORDING:  This is a 16-channel EEG carried out the patient in the awake and drowsy states.  DESCRIPTION:  The waking background activity consists of a low-voltage, symmetrical, fairly well-organized, 9-10 Hz alpha activity seen from the parieto-occipital and posterotemporal regions.  Low-voltage, fast activity, poorly organized was seen and during at times, superimposed on more posterior rhythms.  A mixture of theta and alpha rhythm was seen from the central and temporal regions.  The patient drowses with slowing to irregular, low-voltage theta and beta activity.  Stage II sleep is not obtained.  Hyperventilation was performed and elicited a mild-to- moderate buildup, but failed to elicit any abnormalities.  Intermittent photic stimulation was not performed.  IMPRESSION:  This is a normal EEG.          ______________________________ Thana Farr, MD    [id number] D:  07/26/2012 10:11:26  T:  07/26/2012 11:06:20  Job #:  [id number]

## 2012-07-27 DIAGNOSIS — M2669 Other specified disorders of temporomandibular joint: Secondary | ICD-10-CM

## 2012-07-27 DIAGNOSIS — E876 Hypokalemia: Secondary | ICD-10-CM

## 2012-07-27 LAB — BASIC METABOLIC PANEL
BUN: 5 mg/dL — ABNORMAL LOW (ref 6–23)
CO2: 23 mEq/L (ref 19–32)
Calcium: 9.6 mg/dL (ref 8.4–10.5)
GFR calc non Af Amer: >90 mL/min (ref >90)
Glucose, Bld: 82 mg/dL (ref 70–99)

## 2012-07-27 NOTE — Progress Notes (Signed)
Subjective: Pt states that he is feeling better but does still feel a bit "jittery"  Objective: Vital signs in last 24 hours: Filed Vitals:   07/26/12 1300 07/26/12 1539 07/26/12 2150 07/27/12 0538  BP: 141/98 139/91 134/90 132/91  Pulse: 97 99 100 94  Temp: 99.6 F (37.6 C)  100.1 F (37.8 C) 98.8 F (37.1 C)  TempSrc:    Oral  Resp: 17  20 16   Height:      Weight:    129 lb 11.2 oz (58.832 kg)  SpO2: 100%  96% 100%   Weight change: -7 lb 9.6 oz (-3.447 kg)  Intake/Output Summary (Last 24 hours) at 07/27/12 0850 Last data filed at 07/27/12 0844  Gross per 24 hour  Intake    240 ml  Output      0 ml  Net    240 ml   Vitals reviewed. General: resting in bed, NAD HEENT: Ecchymosis to left forehead, mild TTP at left temporomandibular joint, right lateral subconjunctival hemorrhage, EOMI, vision intact Cardiac: RRR, no rubs, murmurs or gallops Pulm: clear to auscultation bilaterally, no wheezes, rales, or rhonchi Abd: soft, TTP in RLQ and lower mid abdomen, nondistended, no organomegaly or masses Ext: Warm and well perfused, no pedal edema Neuro: Alert and oriented X3, non focal  Lab Results: Basic Metabolic Panel:  Lab 07/27/12 1610 07/26/12 1511  NA 136 136  K 4.2 3.4*  CL 99 99  CO2 23 24  GLUCOSE 82 91  BUN 5* 5*  CREATININE 0.76 0.71  CALCIUM 9.6 9.2  MG -- 1.8  PHOS -- --   Liver Function Tests:  Lab 07/25/12 1258  AST 140*  ALT 84*  ALKPHOS 73  BILITOT 0.8  PROT 8.4*  ALBUMIN 4.4   CBC:  Lab 07/26/12 0650 07/25/12 1704  WBC 4.4 7.5  NEUTROABS -- --  HGB 11.2* 12.7*  HCT 32.3* 35.5*  MCV 92.3 91.3  PLT 129* 142*   CBG:  Lab 07/25/12 1102  GLUCAP 177*   Urine Drug Screen: Drugs of Abuse     Component Value Date/Time   LABOPIA POSITIVE* 07/25/2012 1100   COCAINSCRNUR NONE DETECTED 07/25/2012 1100   LABBENZ NONE DETECTED 07/25/2012 1100   AMPHETMU NONE DETECTED 07/25/2012 1100   THCU POSITIVE* 07/25/2012 1100   LABBARB NONE DETECTED  07/25/2012 1100    Urinalysis:  Lab 07/25/12 1100  COLORURINE AMBER*  LABSPEC 1.024  PHURINE 6.5  GLUCOSEU 100*  HGBUR SMALL*  BILIRUBINUR NEGATIVE  KETONESUR 15*  PROTEINUR >300*  UROBILINOGEN 1.0  NITRITE NEGATIVE  LEUKOCYTESUR NEGATIVE    Studies/Results: Dg Thoracic Spine 2 View  07/25/2012  *RADIOLOGY REPORT*  Clinical Data: Seizure with back pain.  THORACIC SPINE - 2 VIEW  Comparison:  None  Findings:  There is no evidence of thoracic spine fracture. Alignment is normal.  No other significant bone abnormalities are identified.  IMPRESSION: Negative.   Original Report Authenticated By: Irish Lack, M.D.    Dg Lumbar Spine Complete  07/25/2012  *RADIOLOGY REPORT*  Clinical Data: Seizure, fall and low back pain.  LUMBAR SPINE - COMPLETE 4+ VIEW  Comparison: None.  Findings: No acute fracture or subluxation is identified.  No significant degenerative changes.  No bony lesions.  No soft tissue abnormalities.  IMPRESSION: Normal lumbar spine.   Original Report Authenticated By: Irish Lack, M.D.    Dg Pelvis 1-2 Views  07/25/2012  *RADIOLOGY REPORT*  Clinical Data: Fall with pain and low back/pelvis.  PELVIS - 1-2  VIEW  Comparison: None.  Findings: 2 AP views the pelvis. Femoral heads are located. Prominent acetabular osteophytes involve the weightbearing surface of the hips bilaterally. Sacroiliac joints are symmetric.  No acute fracture.  IMPRESSION: No acute osseous abnormality.   Original Report Authenticated By: Jeronimo Greaves, M.D.    Ct Head Wo Contrast  07/25/2012  *RADIOLOGY REPORT*  Clinical Data:  Seizure and left facial bruising.  CT HEAD WITHOUT CONTRAST CT MAXILLOFACIAL WITHOUT CONTRAST  Technique:  Multidetector CT imaging of the head and maxillofacial structures were performed using the standard protocol without intravenous contrast. Multiplanar CT image reconstructions of the maxillofacial structures were also generated.  Comparison:   None.  CT HEAD  Findings:  The brain demonstrates no evidence of hemorrhage, infarction, edema, mass effect, extra-axial fluid collection, hydrocephalus or mass lesion.  The skull is unremarkable.  IMPRESSION: Normal head CT.  CT MAXILLOFACIAL  Findings:   No facial fractures are identified.  There is relative anterior positioning of the mandibular condyles at the level of the TMJ joints, especially on the left.  Correlation suggested with any pain or limitation of movement of the mandible.  No soft tissue abnormalities identified.  Paranasal sinuses and visualized mastoid air cells are normally aerated.  No evidence of soft tissue hematoma or foreign body.  The visualized airway is normally patent.  IMPRESSION: No acute maxillofacial fractures are identified.  There is some suggestion of anterior subluxation of both mandibular condyles at the level of the temporomandibular joints.  This is more prominent on the left.  Recommend correlation with physical exam and range of motion.   Original Report Authenticated By: Irish Lack, M.D.    Ct Maxillofacial Wo Cm  07/25/2012  *RADIOLOGY REPORT*  Clinical Data:  Seizure and left facial bruising.  CT HEAD WITHOUT CONTRAST CT MAXILLOFACIAL WITHOUT CONTRAST  Technique:  Multidetector CT imaging of the head and maxillofacial structures were performed using the standard protocol without intravenous contrast. Multiplanar CT image reconstructions of the maxillofacial structures were also generated.  Comparison:   None.  CT HEAD  Findings: The brain demonstrates no evidence of hemorrhage, infarction, edema, mass effect, extra-axial fluid collection, hydrocephalus or mass lesion.  The skull is unremarkable.  IMPRESSION: Normal head CT.  CT MAXILLOFACIAL  Findings:   No facial fractures are identified.  There is relative anterior positioning of the mandibular condyles at the level of the TMJ joints, especially on the left.  Correlation suggested with any pain or limitation of movement of the mandible.   No soft tissue abnormalities identified.  Paranasal sinuses and visualized mastoid air cells are normally aerated.  No evidence of soft tissue hematoma or foreign body.  The visualized airway is normally patent.  IMPRESSION: No acute maxillofacial fractures are identified.  There is some suggestion of anterior subluxation of both mandibular condyles at the level of the temporomandibular joints.  This is more prominent on the left.  Recommend correlation with physical exam and range of motion.   Original Report Authenticated By: Irish Lack, M.D.    Medications: I have reviewed the patient's current medications. Scheduled Meds:    . docusate sodium  100 mg Oral BID  . folic acid  1 mg Oral Daily  . LORazepam  0-4 mg Oral Q6H   Followed by  . LORazepam  0-4 mg Oral Q12H  . multivitamin with minerals  1 tablet Oral Daily  . [COMPLETED] potassium chloride  40 mEq Oral Once  . [COMPLETED] potassium chloride  40 mEq  Oral Once  . sodium chloride  3 mL Intravenous Q12H  . thiamine  100 mg Oral Daily  . [DISCONTINUED] enoxaparin (LOVENOX) injection  40 mg Subcutaneous Q24H   Continuous Infusions:  PRN Meds:.acetaminophen, acetaminophen, HYDROcodone-acetaminophen, LORazepam, LORazepam, menthol-cetylpyridinium  Assessment/Plan: 36 yo male with history significant for alcohol abuse and without known seizure disorder admitted with new-onset seizure.   1. Alcohol withdrawal seizure: Likely secondary to alcohol withdrawal given history of alcohol abuse and recent abrupt cessation, UDS positive for marijuana and opiates. Placed on telemetry at admission, and has remained in normal sinus rhythm, except yesterday afternoon with 11 beats of Vtach, likely 2/2 withdrawal. Neurology performed an EEG which was normal, and he has had no more seizures. No leukocytosis, remains afebrile. Will likely be able to d/c home.   2. Metabolic acidosis with increased anion gap: On admission AG of 18 with Delta ratio of  0.3 suggesting normal anion gap acidosis, likely secondary to lactic acidemia in setting of acute seizure, alcoholic ketoacidosis also likely concurrent component. AG has improved to 14.  - Continue to monitor BMP  3. Right subconjunctiva hematoma: Secondary to trauma from seizure. Holding Lovenox. Vision remains intact. No intervention needed at this time.  4. Facial contusion: Maxillofacial CT scan without acute facial fractures, possible anterior subluxation of both mandibular condyles at the level of the tempor-mandibular joints.  - Continue PRN Vicodin   5. Alcohol Abuse: Received Valium 5 mg oral x1, pt requesting assistance with alcohol abuse;  His family is supportive and his fiance has been present throughout this hospitalization. He has been on CIWA protocol but has not required additional Ativan. Social Work has discussed cessation with the patient and provided him with some community resources.  - Continue CIWA protocol   6. Hypokalemia: Potassium levels were normal on admission. On HD #2, his potassium dropped to 3.1, likely due to his initial NPO status and hydration with IVF. His Mg has been normal. He was given oral potassium replacement, and his potassium is up to 4.2 today.  7. DVT ppx: SCDs  8. Dispo: D/c home today   Anticipated discharge in today.   The patient does not have a current PCP (No primary provider on file.), therefore will require follow-up at [facility] after discharge.   The patient does have transportation limitations that hinder transportation to clinic appointments.  .Services Needed at time of discharge: Y = Yes, Blank = No PT:   OT:   RN:   Equipment:   Other:     LOS: 2 days   Genelle Gather 07/27/2012, 8:50 AM

## 2012-07-27 NOTE — Care Management Note (Signed)
    Page 1 of 1   07/27/2012     12:13:49 PM   CARE MANAGEMENT NOTE 07/27/2012  Patient:  Joseph Haney, Joseph Haney   Account Number:  [id number]  Date Initiated:  07/27/2012  Documentation initiated by:  GRAVES-BIGELOW,Kysha Muralles  Subjective/Objective Assessment:   Pt admitted with new onset seizures with ETOH abuse. Plan for d/c today.     Action/Plan:   CM spoke to MD Sherrine Maples and she will make f/u appointment at [facility] for Friday. No needs from CM at this time. Pt will not be d/c on any medications at this time.   Anticipated DC Date:  07/27/2012   Anticipated DC Plan:  HOME/SELF CARE      DC Planning Services  CM consult      Choice offered to / List presented to:             Status of service:  Completed, signed off Medicare Important Message given?   (If response is "NO", the following Medicare IM given date fields will be blank) Date Medicare IM given:   Date Additional Medicare IM given:    Discharge Disposition:  HOME/SELF CARE  Per UR Regulation:  Reviewed for med. necessity/level of care/duration of stay  If discussed at Long Length of Stay Meetings, dates discussed:    Comments:

## 2012-07-28 NOTE — Discharge Summary (Signed)
Internal Medicine Teaching Service Attending Note Date: 07/28/2012  Patient name: Joseph Haney  Medical record number: [id number]  Date of birth: 07/05/1976    I evaluated the patient on the day of discharge and discussed the discharge plan with my resident team. I agree with the discharge documentation and disposition.  Thank you for working with me on this patient's care.   Thanks Aletta Edouard 07/28/2012, 2:37 PM

## 2012-08-02 NOTE — ED Provider Notes (Signed)
History     CSN: [id number]  Arrival date & time 07/25/12  1030   First MD Initiated Contact with Patient 07/25/12 1037      Chief Complaint  Patient presents with  . Seizures    HPI Pt presents to department via [facility] for evaluation of seizure. New onset seizure. Pt took OTC sleeping medication this morning, family heard patient yelling in bedroom, was found on floor having seizure. Bruising noted to L side of face. Postictal upon arrival to ED. BP 148/110. CBG 88. 20g LAC. Patient has been drinking heavily recently.   History reviewed. No pertinent past medical history.  History reviewed. No pertinent past surgical history.  Family History  Problem Relation Age of Onset  . Coronary artery disease Mother   . Coronary artery disease Father   . Heart attack Father 36    History  Substance Use Topics  . Smoking status: Current Every Day Smoker  . Smokeless tobacco: Not on file  . Alcohol Use: 0.0 oz/week     Comment: one fifth liquor per week; 80 oz beer/day      Review of Systems  All other systems reviewed and are negative.    Allergies  Review of patient's allergies indicates no known allergies.  Home Medications   Current Outpatient Rx  Name  Route  Sig  Dispense  Refill  . DOCUSATE SODIUM 100 MG PO CAPS   Oral   Take 100 mg by mouth daily.         [name] FOLIC ACID 1 MG PO TABS   Oral   Take 1 tablet (1 mg total) by mouth daily.   30 tablet   11   . ADULT MULTIVITAMIN W/MINERALS CH   Oral   Take 1 tablet by mouth daily.   30 tablet   11   . THIAMINE HCL 100 MG PO TABS   Oral   Take 1 tablet (100 mg total) by mouth daily.   30 tablet   11     BP 140/91  Pulse 69  Temp 98 F (36.7 C) (Oral)  Resp 18  Ht 5\' 5"  (1.651 m)  Wt 129 lb 11.2 oz (58.832 kg)  BMI 21.58 kg/m2  SpO2 100%  Physical Exam  Nursing note and vitals reviewed. Constitutional: He is oriented to person, place, and time. He appears well-developed and well-nourished. No  distress.  HENT:  Head: Normocephalic and atraumatic.  Mouth/Throat:         contusion  Eyes: EOM are normal. Pupils are equal, round, and reactive to light. Right conjunctiva has a hemorrhage. Left conjunctiva has no hemorrhage.  Neck: Normal range of motion.  Cardiovascular: Normal rate and intact distal pulses.   Pulmonary/Chest: No respiratory distress.  Abdominal: Normal appearance. He exhibits no distension.  Musculoskeletal: Normal range of motion.  Neurological: He is alert and oriented to person, place, and time. No cranial nerve deficit.  Skin: Skin is warm and dry. No rash noted.  Psychiatric: He has a normal mood and affect. His behavior is normal.    ED Course  Procedures (including critical care time)  Labs Reviewed  URINALYSIS, ROUTINE W REFLEX MICROSCOPIC - Abnormal; Notable for the following:    Color, Urine AMBER (*)  BIOCHEMICALS MAY BE AFFECTED BY COLOR   APPearance CLOUDY (*)     Glucose, UA 100 (*)     Hgb urine dipstick SMALL (*)     Ketones, ur 15 (*)     Protein, ur >  300 (*)     All other components within normal limits  URINE RAPID DRUG SCREEN (HOSP PERFORMED) - Abnormal; Notable for the following:    Opiates POSITIVE (*)     Tetrahydrocannabinol POSITIVE (*)     All other components within normal limits  LACTIC ACID, PLASMA - Abnormal; Notable for the following:    Lactic Acid, Venous 6.5 (*)     All other components within normal limits  GLUCOSE, CAPILLARY - Abnormal; Notable for the following:    Glucose-Capillary 177 (*)     All other components within normal limits  POCT I-STAT, CHEM 8 - Abnormal; Notable for the following:    Glucose, Bld 195 (*)     Hemoglobin 12.9 (*)     HCT 38.0 (*)     All other components within normal limits  URINE MICROSCOPIC-ADD ON - Abnormal; Notable for the following:    Casts HYALINE CASTS (*)  GRANULAR CAST   All other components within normal limits  COMPREHENSIVE METABOLIC PANEL - Abnormal; Notable for the  following:    Chloride 95 (*)     Glucose, Bld 201 (*)     Total Protein 8.4 (*)     AST 140 (*)     ALT 84 (*)     All other components within normal limits  CBC - Abnormal; Notable for the following:    RBC 4.00 (*)     HCT 37.1 (*)     RDW 15.7 (*)     All other components within normal limits  SALICYLATE LEVEL - Abnormal; Notable for the following:    Salicylate Lvl <2.0 (*)     All other components within normal limits  CBC - Abnormal; Notable for the following:    RBC 3.89 (*)     Hemoglobin 12.7 (*)     HCT 35.5 (*)     RDW 15.6 (*)     Platelets 142 (*)     All other components within normal limits  BASIC METABOLIC PANEL - Abnormal; Notable for the following:    Potassium 3.1 (*)     All other components within normal limits  CBC - Abnormal; Notable for the following:    RBC 3.50 (*)     Hemoglobin 11.2 (*)     HCT 32.3 (*)     RDW 15.6 (*)     Platelets 129 (*)     All other components within normal limits  BASIC METABOLIC PANEL - Abnormal; Notable for the following:    Potassium 3.4 (*)     BUN 5 (*)     All other components within normal limits  BASIC METABOLIC PANEL - Abnormal; Notable for the following:    BUN 5 (*)     All other components within normal limits  HIV ANTIBODY (ROUTINE TESTING)  CREATININE, SERUM  MAGNESIUM  LAB REPORT - SCANNED   No results found.   1. Alcohol withdrawal seizure   2. Alcohol abuse   3. Conjunctival hemorrhage of right eye   4. Temporomandibular subluxation   5. Acute hypokalemia       MDM          Nelia Shi, MD 08/02/12 1924

## 2012-08-05 ENCOUNTER — Encounter: Payer: Self-pay | Admitting: Internal Medicine

## 2012-08-05 ENCOUNTER — Ambulatory Visit (INDEPENDENT_AMBULATORY_CARE_PROVIDER_SITE_OTHER): Payer: Self-pay | Admitting: Internal Medicine

## 2012-08-05 ENCOUNTER — Encounter: Payer: Self-pay | Admitting: Licensed Clinical Social Worker

## 2012-08-05 VITALS — BP 120/78 | HR 95 | Temp 97.5°F | Ht 65.0 in | Wt 135.9 lb

## 2012-08-05 DIAGNOSIS — H1131 Conjunctival hemorrhage, right eye: Secondary | ICD-10-CM

## 2012-08-05 DIAGNOSIS — H1130 Conjunctival hemorrhage, unspecified eye: Secondary | ICD-10-CM

## 2012-08-05 DIAGNOSIS — R569 Unspecified convulsions: Secondary | ICD-10-CM

## 2012-08-05 DIAGNOSIS — E872 Acidosis, unspecified: Secondary | ICD-10-CM

## 2012-08-05 DIAGNOSIS — F10939 Alcohol use, unspecified with withdrawal, unspecified: Secondary | ICD-10-CM

## 2012-08-05 DIAGNOSIS — E8729 Other acidosis: Secondary | ICD-10-CM

## 2012-08-05 DIAGNOSIS — F10239 Alcohol dependence with withdrawal, unspecified: Secondary | ICD-10-CM

## 2012-08-05 LAB — BASIC METABOLIC PANEL WITH GFR
BUN: 8 mg/dL (ref 6–23)
Chloride: 105 mEq/L (ref 96–112)
Creat: 0.84 mg/dL (ref 0.50–1.35)
GFR, Est African American: >89 mL/min
GFR, Est Non African American: >89 mL/min
Glucose, Bld: 88 mg/dL (ref 70–99)
Potassium: 3.7 mEq/L (ref 3.5–5.3)

## 2012-08-05 MED ORDER — IBUPROFEN 800 MG PO TABS: 800.0000 mg | ORAL_TABLET | Freq: Four times a day (QID) | ORAL | Status: DC | PRN

## 2012-08-05 NOTE — Progress Notes (Signed)
  Subjective:    Patient ID: Joseph Haney, male    DOB: [birth year], 36 y.o.   MRN: [id number]  HPI  Presents to clinic for hosp f/u for alcohol withdrawal seizures.  Has been without alcohol since admission ~ 2 weeks.  Reports improvement inn conjunctival hemorrhage, decrease in tremors and anxiety but has achy back and sore jaw secondary to traumatic fall during seizure whereby he had subluxation of temporomadibular joint on left side of his face.  Otherwise without complaints.  Review of Systems As per HPI otherwise negative    Objective:   Physical Exam  Constitutional: He is oriented to person, place, and time. He appears well-developed and well-nourished. No distress.  HENT:  Head: Normocephalic.  Eyes: EOM are normal. Pupils are equal, round, and reactive to light. Right conjunctiva has a hemorrhage.    Cardiovascular: Normal rate and normal heart sounds.        Slightly tachycardic  Pulmonary/Chest: Effort normal and breath sounds normal.  Abdominal: Soft. Bowel sounds are normal.  Musculoskeletal: Normal range of motion. He exhibits tenderness. He exhibits no edema.       Back:  Neurological: He is alert and oriented to person, place, and time.  Skin: Skin is warm and dry. No rash noted. No erythema.  Psychiatric: He has a normal mood and affect.          Assessment & Plan:  1. Alcohol abuse s/p Alcohol seizure: currently maintaining sobriety with adequate family support of his fiance -consulted Social work today -consulted Artist -check BMET today for electrolyte stability  2. Muscle strain of back: secondary to fall -start ibuprofen 800 mg prn -warm compresses  3. Subluxation of TMJ: conservative therapy -ibuprofen -once [employer], refer to dentist/oral surgeon for further evaluation if no improvement  4. F/u in 1 year

## 2012-08-05 NOTE — Patient Instructions (Signed)
Take the ibuprofen every 6-8 hours for the next day or so to help with your back pain and jaw pain. Follow-up with me in 1 year or earlier if needed. Return your paper work for the [employer] as soon as you can.

## 2012-08-05 NOTE — Progress Notes (Signed)
Pt states "I would like to apply for Medicaid to help pay for my medical bill".  CSW met with Mr. Cislo and his fiancee.  Pt has had a recent hospitalization and voiced concern for the medical bill.  Mr. Chervenak is currently unemployed and uninsured.  Pt states he did not meet with a financial counselor during his hospitalization.  CSW encouraged Mr. Halm to completed the [facility] card application with Ms. Hill, as he may also qualify for a [place] discount to assist with bill from hospitalization.  CSW explained [employer] is not an Community education officer, but would assist with medications that would cost over $4 listing and would allow referrals to specialist in the community, as well as, assist with cost to see [facility] physician.  CSW did not want to discourage Medicaid application but explained to Mr. Witherington he may not fall into a category that is eligible in [place] for medicaid.  CSW provided pt with information on persons that are eligible for medicaid.  CSW provided Mr. Margraf with information on MAP, and informed pt MAP can assist with any medication that is not provided by county pharmacy/$4 listing and has a patient assistance program.  CSW also discussed Affordable Care Act and possible ability to obtain reduced insurance, provided referral information. Pt denies add'l needs at this time and is aware CSW is available to assist as needed.

## 2012-08-10 ENCOUNTER — Ambulatory Visit: Payer: Self-pay

## 2012-08-12 ENCOUNTER — Ambulatory Visit: Payer: Self-pay

## 2012-08-19 ENCOUNTER — Ambulatory Visit: Payer: Self-pay

## 2012-10-11 ENCOUNTER — Encounter (HOSPITAL_COMMUNITY): Payer: Self-pay | Admitting: Emergency Medicine

## 2012-10-11 ENCOUNTER — Emergency Department (HOSPITAL_COMMUNITY): Payer: Self-pay

## 2012-10-11 ENCOUNTER — Observation Stay (HOSPITAL_COMMUNITY)
Admission: EM | Admit: 2012-10-11 | Discharge: 2012-10-13 | Disposition: A | Discharge status: Home / Self Care | Payer: Self-pay | Attending: Infectious Disease | Admitting: Infectious Disease

## 2012-10-11 DIAGNOSIS — R569 Unspecified convulsions: Secondary | ICD-10-CM | POA: Insufficient documentation

## 2012-10-11 DIAGNOSIS — F1010 Alcohol abuse, uncomplicated: Secondary | ICD-10-CM

## 2012-10-11 DIAGNOSIS — S0300XA Dislocation of jaw, unspecified side, initial encounter: Secondary | ICD-10-CM

## 2012-10-11 DIAGNOSIS — F10239 Alcohol dependence with withdrawal, unspecified: Secondary | ICD-10-CM | POA: Insufficient documentation

## 2012-10-11 DIAGNOSIS — Z9181 History of falling: Secondary | ICD-10-CM | POA: Insufficient documentation

## 2012-10-11 DIAGNOSIS — F1020 Alcohol dependence, uncomplicated: Secondary | ICD-10-CM | POA: Insufficient documentation

## 2012-10-11 DIAGNOSIS — M2669 Other specified disorders of temporomandibular joint: Secondary | ICD-10-CM | POA: Insufficient documentation

## 2012-10-11 DIAGNOSIS — G40909 Epilepsy, unspecified, not intractable, without status epilepticus: Secondary | ICD-10-CM | POA: Diagnosis present

## 2012-10-11 DIAGNOSIS — F10939 Alcohol use, unspecified with withdrawal, unspecified: Principal | ICD-10-CM | POA: Insufficient documentation

## 2012-10-11 HISTORY — DX: Unspecified convulsions: R56.9

## 2012-10-11 LAB — CBC
HCT: 31.4 % — ABNORMAL LOW (ref 39.0–52.0)
Hemoglobin: 11.4 g/dL — ABNORMAL LOW (ref 13.0–17.0)
MCH: 32.9 pg (ref 26.0–34.0)
MCHC: 36.3 g/dL — ABNORMAL HIGH (ref 30.0–36.0)
RDW: 15.6 % — ABNORMAL HIGH (ref 11.5–15.5)

## 2012-10-11 LAB — BASIC METABOLIC PANEL
BUN: 9 mg/dL (ref 6–23)
Calcium: 9.5 mg/dL (ref 8.4–10.5)
GFR calc Af Amer: >90 mL/min (ref >90)
GFR calc non Af Amer: >90 mL/min (ref >90)
Glucose, Bld: 122 mg/dL — ABNORMAL HIGH (ref 70–99)
Potassium: 3.5 mEq/L (ref 3.5–5.1)

## 2012-10-11 MED ORDER — FOLIC ACID 1 MG PO TABS
1.0000 mg | ORAL_TABLET | Freq: Every day | ORAL | Status: DC
  Filled 2012-10-11: qty 1

## 2012-10-11 MED ORDER — THIAMINE HCL 100 MG/ML IJ SOLN
100.0000 mg | Freq: Every day | INTRAMUSCULAR | Status: DC
  Filled 2012-10-11: qty 1

## 2012-10-11 MED ORDER — VITAMIN B-1 100 MG PO TABS
100.0000 mg | ORAL_TABLET | Freq: Every day | ORAL | Status: DC
  Filled 2012-10-11: qty 1

## 2012-10-11 MED ORDER — ADULT MULTIVITAMIN W/MINERALS CH
1.0000 | ORAL_TABLET | Freq: Every day | ORAL | Status: DC
  Filled 2012-10-11: qty 1

## 2012-10-11 MED ORDER — LORAZEPAM 1 MG PO TABS
0.0000 mg | ORAL_TABLET | Freq: Four times a day (QID) | ORAL | Status: DC
  Filled 2012-10-11: qty 1

## 2012-10-11 MED ORDER — ACETAMINOPHEN 325 MG PO TABS
650.0000 mg | ORAL_TABLET | Freq: Once | ORAL | Status: AC
  Administered 2012-10-11: 650 mg via ORAL
  Filled 2012-10-11: qty 2

## 2012-10-11 MED ORDER — LORAZEPAM 1 MG PO TABS: 0.0000 mg | ORAL_TABLET | Freq: Two times a day (BID) | ORAL | Status: DC

## 2012-10-11 MED ORDER — LORAZEPAM 2 MG/ML IJ SOLN
1.0000 mg | Freq: Once | INTRAMUSCULAR | Status: AC
  Administered 2012-10-11: 1 mg via INTRAVENOUS
  Filled 2012-10-11: qty 1

## 2012-10-11 NOTE — ED Notes (Signed)
PA at bedside.

## 2012-10-11 NOTE — ED Provider Notes (Signed)
History     CSN: [id number]  Arrival date & time 10/11/12  2010   None     Chief Complaint  Patient presents with  . Seizures    (Consider location/radiation/quality/duration/timing/severity/associated sxs/prior treatment) HPI History provided by pt.   Pt is an alcoholic.  Drinks 1 pint of gin a day.  Quit on his own, cold [place], 3 days ago.  Today he had a witnessed, generalized tonic clonic seizure.  Larey Seat and his his head on tile flooring, seizure activity lasted for approximately 10 minutes, he bit his tongue but was not incontinent of urine.  He is currently at his baseline mentation.  No recent illnesses, w/ exception of tremors and nausea (his typical withdrawal sx), and denies recent head trauma.  Per prior chart, pt had a seizure in setting of alcohol withdrawal in 07/2012 and was admitted to hospital.  He reports no other history of seizures, though he may have had an unwitnessed one 3 weeks ago, the last time he quit drinking.  Compliant w/ thiamine and folate.  He would like to be properly detoxed.  Past Medical History  Diagnosis Date  . Seizure     History reviewed. No pertinent past surgical history.  Family History  Problem Relation Age of Onset  . Coronary artery disease Mother   . Coronary artery disease Father   . Heart attack Father 42    History  Substance Use Topics  . Smoking status: Never Smoker   . Smokeless tobacco: Not on file  . Alcohol Use: 0.0 oz/week     Comment: one fifth liquor per week; 80 oz beer/day      Review of Systems  All other systems reviewed and are negative.    Allergies  Review of patient's allergies indicates no known allergies.  Home Medications   Current Outpatient Rx  Name  Route  Sig  Dispense  Refill  . docusate sodium (COLACE) 100 MG capsule   Oral   Take 100 mg by mouth daily.         . folic acid (FOLVITE) 1 MG tablet   Oral   Take 1 tablet (1 mg total) by mouth daily.   30 tablet   11   . Multiple  Vitamin (MULTIVITAMIN WITH MINERALS) TABS   Oral   Take 1 tablet by mouth daily.   30 tablet   11   . naproxen sodium (ANAPROX) 220 MG tablet   Oral   Take 220 mg by mouth 2 (two) times daily with a meal.         . thiamine 100 MG tablet   Oral   Take 1 tablet (100 mg total) by mouth daily.   30 tablet   11     BP 152/100  Pulse 89  Temp(Src) 98.3 F (36.8 C) (Oral)  Resp 21  SpO2 100%  Physical Exam  Nursing note and vitals reviewed. Constitutional: He is oriented to person, place, and time. He appears well-developed and well-nourished. No distress.  HENT:  Head: Normocephalic and atraumatic.  Mouth/Throat: Oropharynx is clear and moist.  No scalp hematoma.  Mild edema and tenderness L maxilla and mandible.  ROM jaw slightly limited, though patient reports this is chronic since fall associated w/ last seizure in 07/2012.  Superficial, hemostatic lac on left lateral tongue.    Eyes:  Normal appearance  Neck: Normal range of motion.  Cardiovascular: Normal rate, regular rhythm and intact distal pulses.   Pulmonary/Chest: Effort normal and  breath sounds normal.  Musculoskeletal: Normal range of motion.  Cervical spine non-tender  Neurological: He is alert and oriented to person, place, and time. No sensory deficit. Coordination normal.  CN 3-12 intact.  No nystagmus. 5/5 and equal upper and lower extremity strength.  No past pointing.     Skin: Skin is warm and dry. No rash noted.  Psychiatric: He has a normal mood and affect. His behavior is normal.    ED Course  Procedures (including critical care time)  Labs Reviewed  CBC - Abnormal; Notable for the following:    RBC 3.46 (*)    Hemoglobin 11.4 (*)    HCT 31.4 (*)    MCHC 36.3 (*)    RDW 15.6 (*)    All other components within normal limits  BASIC METABOLIC PANEL - Abnormal; Notable for the following:    Glucose, Bld 122 (*)    All other components within normal limits  GLUCOSE, CAPILLARY - Abnormal;  Notable for the following:    Glucose-Capillary 131 (*)    All other components within normal limits   Ct Head Wo Contrast  10/11/2012  *RADIOLOGY REPORT*  Clinical Data: Witnessed seizure.  CT HEAD WITHOUT CONTRAST  Technique:  Contiguous axial images were obtained from the base of the skull through the vertex without contrast.  Comparison: 07/25/2012  Findings: The brain stem, cerebellum, cerebral peduncles, thalami, basal ganglia, basilar cisterns, and ventricular system appear unremarkable.  No intracranial hemorrhage, mass lesion, or acute infarction is identified.  IMPRESSION:  No significant abnormality identified.   Original Report Authenticated By: [name], M.D.      1. Seizure   2. Alcoholism       MDM  37yo M presents w/ witnessed seizure, in setting of alcohol withdrawal.  Drinks pint gin/day, most recent drink 3 days ago and has been experiencing nausea and tremors since then.  No recent illnesses or head trauma, but hit his head when fell during seizure today.  Has been admitted for alcohol withdrawal with seizure in past, but otherwise no h/o seizures.  No scalp hematoma, cervical spine non-tender, no focal neuro deficits and his fiance reports baseline mentation currently.  Labs unremarkable and CT head pending.  Pt has received 1mg  IV ativan and placed on CIWA protocol.  Tylenol ordered for headache.  11:21 PM    CT head negative.  Results discussed w/ pt.  [facility] consulted for admission.   12:12 AM       Otilio Miu, [name] 10/12/12 0020

## 2012-10-11 NOTE — ED Notes (Addendum)
Patient reports having seizure tonight; witnessed by fiance (fiance currently parking the car).  Patient was seen by EMS personnel; refused transport.  Patient reports that he has been newly diagnosed with seizures (two months ago).  Patient alert and oriented x4; PERRL present.  Follow commands and answers all questions appropriately.  Patient reports that he has been out of his seizure medications.  Patient has history of alcohol abuse; denies drinking alcohol today.  Patient does not know name of seizure medication he is on; unable to find name based on patient's past records.

## 2012-10-11 NOTE — ED Notes (Signed)
Patient transported to CT 

## 2012-10-11 NOTE — ED Notes (Addendum)
Pt states that he has not had any ETOH in 3 days, which he feels triggered the seizure.  Pt drinks 1 pint daily.  Pt also had a seizure in Nov after he had not had a drink in 3-5 days.  Seizure was witnessed by fiance.  She states that pt hit his head on hard surface near fire place.  Pt is complaining of pain on L side of head and L jaw. Pt complaining of pain R foot.  Pt states that he has been drinking a pint a day for about 6 months.

## 2012-10-12 ENCOUNTER — Emergency Department (HOSPITAL_COMMUNITY): Payer: Self-pay

## 2012-10-12 LAB — HIV ANTIBODY (ROUTINE TESTING W REFLEX): HIV: NONREACTIVE

## 2012-10-12 LAB — MAGNESIUM: Magnesium: 1.6 mg/dL (ref 1.5–2.5)

## 2012-10-12 LAB — HEPATIC FUNCTION PANEL
Bilirubin, Direct: 0.1 mg/dL (ref 0.0–0.3)
Indirect Bilirubin: 0.5 mg/dL (ref 0.3–0.9)
Total Protein: 8.2 g/dL (ref 6.0–8.3)

## 2012-10-12 LAB — URINALYSIS, ROUTINE W REFLEX MICROSCOPIC
Leukocytes, UA: NEGATIVE
Nitrite: NEGATIVE
Specific Gravity, Urine: 1.028 (ref 1.005–1.030)
Urobilinogen, UA: 1 mg/dL (ref 0.0–1.0)
pH: 8.5 — ABNORMAL HIGH (ref 5.0–8.0)

## 2012-10-12 LAB — URINE MICROSCOPIC-ADD ON

## 2012-10-12 LAB — RAPID URINE DRUG SCREEN, HOSP PERFORMED
Benzodiazepines: NOT DETECTED
Cocaine: NOT DETECTED

## 2012-10-12 MED ORDER — LORAZEPAM 1 MG PO TABS: 0.0000 mg | ORAL_TABLET | Freq: Two times a day (BID) | ORAL | Status: DC

## 2012-10-12 MED ORDER — MORPHINE SULFATE 4 MG/ML IJ SOLN
4.0000 mg | Freq: Once | INTRAMUSCULAR | Status: AC
  Administered 2012-10-12: 4 mg via INTRAVENOUS
  Filled 2012-10-12: qty 1

## 2012-10-12 MED ORDER — DIPHENHYDRAMINE HCL 50 MG PO CAPS: 50.0000 mg | ORAL_CAPSULE | Freq: Once | ORAL | Status: DC

## 2012-10-12 MED ORDER — DIPHENHYDRAMINE HCL 25 MG PO CAPS
25.0000 mg | ORAL_CAPSULE | Freq: Once | ORAL | Status: AC
  Administered 2012-10-12: 25 mg via ORAL
  Filled 2012-10-12: qty 1

## 2012-10-12 MED ORDER — ENOXAPARIN SODIUM 40 MG/0.4ML ~~LOC~~ SOLN
40.0000 mg | SUBCUTANEOUS | Status: DC
  Administered 2012-10-12 – 2012-10-13 (×2): 40 mg via SUBCUTANEOUS
  Filled 2012-10-12 (×2): qty 0.4

## 2012-10-12 MED ORDER — DIPHENHYDRAMINE HCL 50 MG/ML IJ SOLN
25.0000 mg | Freq: Once | INTRAMUSCULAR | Status: AC
  Administered 2012-10-12: 25 mg via INTRAVENOUS
  Filled 2012-10-12: qty 1

## 2012-10-12 MED ORDER — LORAZEPAM 2 MG/ML IJ SOLN: 1.0000 mg | Freq: Three times a day (TID) | INTRAMUSCULAR | Status: DC

## 2012-10-12 MED ORDER — LORAZEPAM 1 MG PO TABS
1.0000 mg | ORAL_TABLET | Freq: Three times a day (TID) | ORAL | Status: DC
  Administered 2012-10-12 – 2012-10-13 (×3): 1 mg via ORAL
  Filled 2012-10-12 (×4): qty 1

## 2012-10-12 MED ORDER — LORAZEPAM 2 MG/ML IJ SOLN: 0.0000 mg | Freq: Two times a day (BID) | INTRAMUSCULAR | Status: DC

## 2012-10-12 MED ORDER — HYDROCODONE-ACETAMINOPHEN 5-325 MG PO TABS
1.0000 | ORAL_TABLET | ORAL | Status: DC | PRN
  Administered 2012-10-12 – 2012-10-13 (×4): 2 via ORAL
  Filled 2012-10-12 (×4): qty 2

## 2012-10-12 MED ORDER — THIAMINE HCL 100 MG/ML IJ SOLN
Freq: Once | INTRAVENOUS | Status: AC
  Administered 2012-10-12: 06:00:00 via INTRAVENOUS
  Filled 2012-10-12: qty 1000

## 2012-10-12 MED ORDER — SODIUM CHLORIDE 0.9 % IJ SOLN
3.0000 mL | Freq: Two times a day (BID) | INTRAMUSCULAR | Status: DC
  Administered 2012-10-12 – 2012-10-13 (×2): 3 mL via INTRAVENOUS

## 2012-10-12 MED ORDER — CHLORHEXIDINE GLUCONATE 0.12 % MT SOLN
15.0000 mL | Freq: Two times a day (BID) | OROMUCOSAL | Status: DC
  Administered 2012-10-12 – 2012-10-13 (×2): 15 mL via OROMUCOSAL
  Filled 2012-10-12 (×3): qty 15

## 2012-10-12 MED ORDER — ACETAMINOPHEN 325 MG PO TABS: 650.0000 mg | ORAL_TABLET | Freq: Four times a day (QID) | ORAL | Status: DC | PRN

## 2012-10-12 MED ORDER — LORAZEPAM 2 MG/ML IJ SOLN: 1.0000 mg | Freq: Four times a day (QID) | INTRAMUSCULAR | Status: DC | PRN

## 2012-10-12 MED ORDER — LORAZEPAM 2 MG/ML IJ SOLN
0.0000 mg | Freq: Four times a day (QID) | INTRAMUSCULAR | Status: DC
  Administered 2012-10-12: 1 mg via INTRAVENOUS

## 2012-10-12 MED ORDER — LORAZEPAM 1 MG PO TABS
1.0000 mg | ORAL_TABLET | Freq: Four times a day (QID) | ORAL | Status: DC | PRN
  Administered 2012-10-13: 1 mg via ORAL
  Filled 2012-10-12: qty 1

## 2012-10-12 MED ORDER — FOLIC ACID 1 MG PO TABS
1.0000 mg | ORAL_TABLET | Freq: Every day | ORAL | Status: DC
  Administered 2012-10-12 – 2012-10-13 (×2): 1 mg via ORAL
  Filled 2012-10-12 (×2): qty 1

## 2012-10-12 MED ORDER — LORAZEPAM 1 MG PO TABS: 0.0000 mg | ORAL_TABLET | Freq: Four times a day (QID) | ORAL | Status: DC

## 2012-10-12 MED ORDER — PNEUMOCOCCAL VAC POLYVALENT 25 MCG/0.5ML IJ INJ
0.5000 mL | INJECTION | INTRAMUSCULAR | Status: DC
  Filled 2012-10-12: qty 0.5

## 2012-10-12 MED ORDER — ADULT MULTIVITAMIN W/MINERALS CH
1.0000 | ORAL_TABLET | Freq: Every day | ORAL | Status: DC
  Administered 2012-10-12 – 2012-10-13 (×2): 1 via ORAL
  Filled 2012-10-12 (×2): qty 1

## 2012-10-12 MED ORDER — THIAMINE HCL 100 MG/ML IJ SOLN
100.0000 mg | Freq: Every day | INTRAMUSCULAR | Status: DC
  Filled 2012-10-12: qty 1

## 2012-10-12 MED ORDER — ACETAMINOPHEN 650 MG RE SUPP: 650.0000 mg | Freq: Four times a day (QID) | RECTAL | Status: DC | PRN

## 2012-10-12 MED ORDER — LORAZEPAM 2 MG/ML IJ SOLN
1.0000 mg | Freq: Four times a day (QID) | INTRAMUSCULAR | Status: DC | PRN
  Filled 2012-10-12: qty 1

## 2012-10-12 MED ORDER — VITAMIN B-1 100 MG PO TABS
100.0000 mg | ORAL_TABLET | Freq: Every day | ORAL | Status: DC
  Administered 2012-10-12 – 2012-10-13 (×2): 100 mg via ORAL
  Filled 2012-10-12 (×2): qty 1

## 2012-10-12 MED ORDER — LORAZEPAM 1 MG PO TABS
1.0000 mg | ORAL_TABLET | Freq: Four times a day (QID) | ORAL | Status: DC | PRN
  Administered 2012-10-12: 1 mg via ORAL
  Filled 2012-10-12: qty 1

## 2012-10-12 NOTE — ED Provider Notes (Signed)
Medical screening examination/treatment/procedure(s) were conducted as a shared visit with non-physician practitioner(s) and myself.  I personally evaluated the patient during the encounter.  Pt with h/o partial seizures, with similar sz tonight, but not returning to baseline.  Dilantin slightly low.  As he is not back to his baseline, plan for admission.  Olivia Mackie, MD 10/12/12 [phone]

## 2012-10-12 NOTE — ED Notes (Signed)
Pt states that after receiving morphine he began experiencing itching. No hives noted. MD notified. Benadryl given.

## 2012-10-12 NOTE — H&P (Signed)
Hospital Admission Note Date: 10/12/2012  Patient name: Joseph Haney Medical record number: [id number] Date of birth: July 06, 1976 Age: 37 y.o. Gender: male PCP: Genelle Gather, MD  Medical Service: [facility]  Attending physician: Dr. Daiva Eves   1st Contact: Dr. Virgina Organ   Pager:[phone] 2nd Contact: Dr. Dierdre Searles    Pager:[phone] After 5 pm or weekends: 1st Contact:  Intern on call   Pager: [phone] 2nd Contact:  Resident on call  Pager: [phone]  Chief Complaint: Seizure  History of Present Illness: Patient is a 37 year old male with past medical history of alcohol abuse and withdrawal seizures presenting with complaints of seizure tonight. Patient drinks 1 pint of gin a day he reports that he stop drinking 3 days ago cold [place] because he wanted to quit. Tonight he reports he was folding clothes and then blacked out. The next thing he knew, EMS personnel were standing above him. He denies any prodromal symptoms prior to his loss of consciousness. Girlfriend at bedside reports that she was present during episode, the patient fell to the floor and started shaking. She noticed that he appeared to be choking and so she turned him on his side. No bowel or bladder incontinence. This episode lasted about 10 minutes. When EMS arrived, patient refused transport to hospital and his mother brought him to the emergency room. Currently he feels tired, nauseated, shaky and with headaches. He reports a cough since his episode earlier this evening that is nonproductive. He reports low back pain on his left side from where he struck the floor. He bit his tongue during this episode and continues to experience some pain from this. He also reports left-sided jaw pain, which he says has been chronic since his last seizure. Patient thinks that he may have had a seizure about 3 weeks ago when he tried to stop drinking, but he was home alone at the time and is unsure about what happened. He remembers waking up on the  floor and not remembering what happened to him. No other interval events.. Patient was admitted to [facility] in November for similar presentation. At that time diagnostic workup including CT of his head and EEG were negative for any underlying cause of seizure vulnerability. He was unable to be placed into alcohol detox inpatient due to his insurance status. He was discharged him and reports a couple of weeks of sobriety before he started drinking again. He reports he continues to take his multivitamin which was prescribed for him in hospital discharge. Denies chest pain, shortness of breath, abdominal pain, diarrhea, hematuria, hematemesis, lower extremity edema, numbness or weakness.  Meds: Current Outpatient Rx  Name  Route  Sig  Dispense  Refill  . docusate sodium (COLACE) 100 MG capsule   Oral   Take 100 mg by mouth daily.         . folic acid (FOLVITE) 1 MG tablet   Oral   Take 1 tablet (1 mg total) by mouth daily.   30 tablet   11   . Multiple Vitamin (MULTIVITAMIN WITH MINERALS) TABS   Oral   Take 1 tablet by mouth daily.   30 tablet   11   . naproxen sodium (ANAPROX) 220 MG tablet   Oral   Take 220 mg by mouth 2 (two) times daily with a meal.         . thiamine 100 MG tablet   Oral   Take 1 tablet (100 mg total) by mouth daily.   30  tablet   11     Allergies: Allergies as of 10/11/2012  . (No Known Allergies)   Past Medical History  Diagnosis Date  . Seizure    History reviewed. No pertinent past surgical history. Family History  Problem Relation Age of Onset  . Coronary artery disease Mother   . Coronary artery disease Father   . Heart attack Father 51   History   Social History  . Marital Status: Single    Spouse Name: N/A    Number of Children: N/A  . Years of Education: N/A   Occupational History  . Not on file.   Social History Main Topics  . Smoking status: Never Smoker   . Smokeless tobacco: Not on file  . Alcohol Use: 0.0  oz/week     Comment: one fifth liquor per week; 80 oz beer/day  . Drug Use: 1.00 per week    Special: Marijuana  . Sexually Active: Not on file   Other Topics Concern  . Not on file   Social History Narrative  . No narrative on file    Review of Systems: 10 pt ROS performed, pertinent positives and negatives noted in HPI  Physical Exam: Blood pressure 154/100, pulse 93, temperature 98.3 F (36.8 C), temperature source Oral, resp. rate 21, SpO2 100.00%. Vitals reviewed. General: resting in bed, tremulous, NAD HEENT: Small non-bleeding laceration on L lateral tongue from prior bite. Tenderness to palpation of TMJ joint, no clicks, full ROM of jaw. PERRL, EOMI, MMM of OP Cardiac: RRR w HR 90s, no rubs, murmurs or gallops Pulm: Slight decrease in breath sounds in R lower-mid lung field. No w/r/r Abd: soft, nontender, nondistended, BS present Ext: warm and well perfused, no pedal edema Neuro: Completely alert during her exam. Cranial nerves II through XII intact. 5 out of 5 motor strength of face, arms, and legs. Sensation intact bilateral upper and lower extremities. 1+ patellar and brachial reflexes bilaterally. Soft tremor noted with finger-nose-finger bilaterally.  Lab results: Basic Metabolic Panel:  Recent Labs  16/10/96 2022  NA 136  K 3.5  CL 97  CO2 24  GLUCOSE 122*  BUN 9  CREATININE 0.62  CALCIUM 9.5   CBC:  Recent Labs  10/11/12 2022  WBC 6.1  HGB 11.4*  HCT 31.4*  MCV 90.8  PLT 188  \CBG:  Recent Labs  10/11/12 2029  GLUCAP 131*   Imaging results:  Ct Head Wo Contrast  10/11/2012  *RADIOLOGY REPORT*  Clinical Data: Witnessed seizure.  CT HEAD WITHOUT CONTRAST  Technique:  Contiguous axial images were obtained from the base of the skull through the vertex without contrast.  Comparison: 07/25/2012  Findings: The brain stem, cerebellum, cerebral peduncles, thalami, basal ganglia, basilar cisterns, and ventricular system appear unremarkable.  No  intracranial hemorrhage, mass lesion, or acute infarction is identified.  IMPRESSION:  No significant abnormality identified.   Original Report Authenticated By: [name], M.D.    Assessment & Plan by Problem: 1) Alcohol withdrawal seizure Drinks a pint a day of gin (1 pint = 11 shots), last drink 3 days ago. Recent admission in November of 2013 for alcohol withdrawal seizures. Extensive workup at that time including CT of the head and EEG negative for other seizure focus. No metabolic abnormalities, including normal glucose, to suggest alternate precipitant of seizure. CT of head negative for any acute pathology. On our evaluation, patient is completely oriented, with some slowed speech which may reflect post ictal state. Physical exam notable for  soft tremors no focal neurological deficits.  His girlfriend noticed him shaking a little bit and he does report a cough since seizure. This may represent aspiration event. Hypertension likely related to alcohol withdrawal. He does express a willingness to undergo detox and has been taking multivitamins at home. During her admission, inpatient placement and detox is limited by patient's insurance status. - Scheduled Ativan every 6 hours based on CIWA protocol, thiamine, and folate -Fluid 150 cc/hour x10   - Chest x-ray to look for any aspiration -Tylenol for headache - Put in order for clinical social worker in the morning to assess for rehab. Recommend calling to follow up. If the patient eligible for inpatient detox, would investigate outpatient options  2. DVT prophy: lovenox   Discharge deferred at this time, awaiting improvement of current medical problems. Anticipated discharge in approximately 1-2 day(s).   The patient does have a current PCP Sherrine Maples, Aurelio Brash, MD), therefore will be requiring OPC follow-up after discharge.   The patient does not have transportation limitations that hinder transportation to clinic  appointments.  Signed: Bronson Curb 10/12/2012, 12:59 AM

## 2012-10-12 NOTE — H&P (Addendum)
Internal Medicine Teaching Service Attending Note Date: 10/12/2012  Patient name: Joseph Haney  Medical record number: [id number]  Date of birth: 09-12-1975    This patient has been seen and discussed with the house staff. Please see their note for complete details. I concur with their findings with the following additions/corrections:  Patient lucide this am. He and his fiance are concerned about area where he bit himself along his gum line  I think oral hibiclenz is fine for that  I would also give him a scheduled dose of ativan 1mg  tid alogn with CIWA    He needs detox as an inpatient in Psychiatry, we will ask CM to look intot this  Acey Lav 10/12/2012, 10:42 AM

## 2012-10-12 NOTE — ED Notes (Signed)
Report called to Chris, RN on 3W 

## 2012-10-12 NOTE — Progress Notes (Signed)
Utilization review completed.  

## 2012-10-12 NOTE — ED Notes (Signed)
Preparing pt for transport.

## 2012-10-13 DIAGNOSIS — F10239 Alcohol dependence with withdrawal, unspecified: Secondary | ICD-10-CM

## 2012-10-13 DIAGNOSIS — F1020 Alcohol dependence, uncomplicated: Secondary | ICD-10-CM

## 2012-10-13 DIAGNOSIS — R569 Unspecified convulsions: Secondary | ICD-10-CM

## 2012-10-13 DIAGNOSIS — F1010 Alcohol abuse, uncomplicated: Secondary | ICD-10-CM

## 2012-10-13 LAB — CBC
HCT: 29.1 % — ABNORMAL LOW (ref 39.0–52.0)
MCHC: 35.1 g/dL (ref 30.0–36.0)
RDW: 15.9 % — ABNORMAL HIGH (ref 11.5–15.5)
WBC: 5.6 10*3/uL (ref 4.0–10.5)

## 2012-10-13 LAB — BASIC METABOLIC PANEL
Chloride: 105 mEq/L (ref 96–112)
Creatinine, Ser: 0.73 mg/dL (ref 0.50–1.35)
GFR calc Af Amer: >90 mL/min (ref >90)
GFR calc non Af Amer: >90 mL/min (ref >90)
Potassium: 3.5 mEq/L (ref 3.5–5.1)

## 2012-10-13 MED ORDER — CHLORHEXIDINE GLUCONATE 0.12 % MT SOLN: 15.0000 mL | Freq: Two times a day (BID) | OROMUCOSAL | Status: DC

## 2012-10-13 MED ORDER — LORAZEPAM 0.5 MG PO TABS: 0.5000 mg | ORAL_TABLET | Freq: Two times a day (BID) | ORAL | Status: AC | PRN

## 2012-10-13 NOTE — Progress Notes (Signed)
Subjective: Mr. Lowdermilk was seen and examined at bedside.  He is serious about alcohol cessation and detox and would like information on facilities.  Today is about day 5 of not drinking.  His fiance and mother were also present in the room this afternoon when social work discussed options.  Referral will be made today for [facility] for substance abuse treatment.  He currently denies any N/V/D, fever, chills, headache, chest pain, shortness of breath, abdominal pain, or any urinary complaints at this time.     Objective: Vital signs in last 24 hours: Filed Vitals:   10/12/12 0430 10/12/12 0515 10/12/12 1400 10/13/12 0600  BP:  152/90 119/75 128/83  Pulse:  98 108 88  Temp: 98.6 F (37 C) 97.8 F (36.6 C) 98.1 F (36.7 C) 98.3 F (36.8 C)  TempSrc: Oral Oral    Resp:  20 20 18   Height:  5\' 6"  (1.676 m)    Weight:  138 lb 12.8 oz (62.959 kg)    SpO2:  100% 99% 99%   Weight change:  No intake or output data in the 24 hours ending 10/13/12 1318 Vitals reviewed. General: sitting up in bed, NAD HEENT: PERRLA, EOMI, no scleral icterus Cardiac: RRR, no rubs, murmurs or gallops Pulm: clear to auscultation bilaterally, no wheezes, rales, or rhonchi Abd: soft, nontender, nondistended, BS present Ext: warm and well perfused, no pedal edema, +2dp b/l Neuro: alert and oriented X3, cranial nerves II-XII grossly intact, strength and sensation to light touch equal in bilateral upper and lower extremities  Lab Results: Basic Metabolic Panel:  Recent Labs Lab 10/11/12 2022 10/12/12 0051 10/13/12 0427  NA 136  --  140  K 3.5  --  3.5  CL 97  --  105  CO2 24  --  27  GLUCOSE 122*  --  87  BUN 9  --  4*  CREATININE 0.62  --  0.73  CALCIUM 9.5  --  9.1  MG  --  1.6  --    Liver Function Tests:  Recent Labs Lab 10/12/12 0120  AST 43*  ALT 16  ALKPHOS 60  BILITOT 0.6  PROT 8.2  ALBUMIN 4.4   CBC:  Recent Labs Lab 10/11/12 2022 10/13/12 0427  WBC 6.1 5.6  HGB 11.4* 10.2*  HCT  31.4* 29.1*  MCV 90.8 92.1  PLT 188 169   CBG:  Recent Labs Lab 10/11/12 2029  GLUCAP 131*   Urine Drug Screen: Drugs of Abuse     Component Value Date/Time   LABOPIA NONE DETECTED 10/12/2012 0141   COCAINSCRNUR NONE DETECTED 10/12/2012 0141   LABBENZ NONE DETECTED 10/12/2012 0141   AMPHETMU NONE DETECTED 10/12/2012 0141   THCU NONE DETECTED 10/12/2012 0141   LABBARB NONE DETECTED 10/12/2012 0141    Urinalysis:  Recent Labs Lab 10/12/12 0141  COLORURINE AMBER*  LABSPEC 1.028  PHURINE 8.5*  GLUCOSEU NEGATIVE  HGBUR NEGATIVE  BILIRUBINUR SMALL*  KETONESUR >80*  PROTEINUR 100*  UROBILINOGEN 1.0  NITRITE NEGATIVE  LEUKOCYTESUR NEGATIVE   Studies/Results: Dg Chest 2 View  10/12/2012  *RADIOLOGY REPORT*  Clinical Data: Cough.  Query aspiration.  CHEST - 2 VIEW  Comparison: 07/25/2012  Findings: Airway thickening may reflect bronchitis or reactive airways disease.  No airspace opacity is identified to suggest bacterial pneumonia pattern.  Cardiac and mediastinal contours appear unremarkable.  No pleural effusion identified.  IMPRESSION:  1. Airway thickening may reflect bronchitis or reactive airways disease.  No airspace opacity is identified to suggest  bacterial pneumonia pattern.   Original Report Authenticated By: [name], M.D.    Ct Head Wo Contrast  10/11/2012  *RADIOLOGY REPORT*  Clinical Data: Witnessed seizure.  CT HEAD WITHOUT CONTRAST  Technique:  Contiguous axial images were obtained from the base of the skull through the vertex without contrast.  Comparison: 07/25/2012  Findings: The brain stem, cerebellum, cerebral peduncles, thalami, basal ganglia, basilar cisterns, and ventricular system appear unremarkable.  No intracranial hemorrhage, mass lesion, or acute infarction is identified.  IMPRESSION:  No significant abnormality identified.   Original Report Authenticated By: [name], M.D.    Medications: I have reviewed the patient's current  medications. Scheduled Meds: . chlorhexidine  15 mL Mouth/Throat BID  . enoxaparin (LOVENOX) injection  40 mg Subcutaneous Q24H  . folic acid  1 mg Oral Daily  . LORazepam  0-4 mg Oral Q6H   Followed by  . [START ON 10/14/2012] LORazepam  0-4 mg Oral Q12H  . LORazepam  1 mg Oral Q8H  . multivitamin with minerals  1 tablet Oral Daily  . pneumococcal 23 valent vaccine  0.5 mL Intramuscular Tomorrow-1000  . sodium chloride  3 mL Intravenous Q12H  . thiamine  100 mg Oral Daily   Or  . thiamine  100 mg Intravenous Daily   Continuous Infusions:  PRN Meds:.acetaminophen, acetaminophen, HYDROcodone-acetaminophen, LORazepam, LORazepam Assessment/Plan: 1) Alcohol withdrawal seizure--Drinks a pint a day of gin (1 pint = 11 shots), last drink ~5 days ago. Recent admission in November of 2013 for alcohol withdrawal seizures. Extensive workup at that time including CT of the head and EEG negative for other seizure focus. No metabolic abnormalities, including normal glucose, to suggest alternate precipitant of seizure. CT of head on admission negative for any acute pathology. No more seizures since admission.  CXR negative for any PNA.  He is willing to undergo detox and has been taking multivitamins at home.  - Continue Scheduled Ativan 1mg  PO TID - CIWA - Thiamine, and folate  - Tylenol for headache  - seen by social work--given list of resources and referral made to [facility] for substance abuse  DVT prophy: lovenox  Diet: Regular Dispo: D/C home today  The patient does have a current PCP Sherrine Maples, Aurelio Brash, MD), therefore will be requiring OPC follow-up after discharge.  The patient does not have transportation limitations that hinder transportation to clinic appointments.  Services Needed at time of discharge: Y = Yes, Blank = No PT:   OT:   RN:   Equipment:   Other: [facility] referral made for substance abuse counseling    LOS: 2 days   Darden Palmer 10/13/2012, 1:18 PM

## 2012-10-13 NOTE — Progress Notes (Signed)
D/c orders received, IV removed with gauze on, pt remains in stable condition, pt meds and instructions reviewed and given to pt; reminded pt of resources for alcohol abuse; pt d/c to home

## 2012-10-13 NOTE — Progress Notes (Signed)
Internal Medicine Teaching Service Attending Note Date: 10/13/2012  Patient name: Joseph Haney  Medical record number: [id number]  Date of birth: 04/16/1976    This patient has been seen and discussed with the house staff. Please see their note for complete details. I concur with their findings with the following additions/corrections:  We are trying to get this patient plugged in with a detox Center for his heavy alcohol abuse. If this is not possible I would favor giving him a long-acting benzodiazepine such as lorazepam or diazepam which he can take to help treat his alcohol withdrawal and help prevent remission potentially. We had along talk with him about his alcoholism and need for therapy. We also discussed the potential risks of time commitment benzodiazepines with alcohol. Again also emphasize risk of abrupt benzodiazepine withdrawal or abrupt alcohol withdrawal should he start back drinking alcohol again. Hopefully with the aid of the benzodiazepines counseling and a detox center he can remain sober. He need close followup in our clinic.  Acey Lav 10/13/2012, 11:59 AM

## 2012-10-13 NOTE — Progress Notes (Signed)
Covering Clinical Social Worker (CSW) has seen pt for substance abuse treatment options. A referral has been sent to [facility] whom pt has been directed to follow up with as pt is dc'ing today. CSW has provided pt with a significant amount of resources. Pt presented pleasant, motivated and agreeable to follow up with [facility], [facility], [facility] for possible inpatient treatment. Pt has also been directed to attend AA meetings and outpatient substance abuse counseling until he is admitted in inpatient treatment. Pt and families questions were all answered, no concerns addressed.   A full assessment to follow.  CSW is signing off.  Theresia Bough, MSW, Theresia Majors [phone]

## 2012-10-13 NOTE — Discharge Summary (Signed)
Internal Medicine Teaching [facility] Discharge Note  Name: Joseph Haney MRN: [id number] DOB: 1975-12-14 37 y.o.  Date of Admission: 10/11/2012 10:25 PM Date of Discharge: 10/13/2012 Attending Physician: Randall Hiss, MD  Discharge Diagnosis: Principal Problem:   Alcohol withdrawal seizure Active Problems:   Alcohol abuse   Temporomandibular subluxation  Discharge Medications:   Medication List    TAKE these medications       chlorhexidine 0.12 % solution  Commonly known as:  PERIDEX  Use as directed 15 mLs in the mouth or throat 2 (two) times daily.     docusate sodium 100 MG capsule  Commonly known as:  COLACE  Take 100 mg by mouth daily.     folic acid 1 MG tablet  Commonly known as:  FOLVITE  Take 1 tablet (1 mg total) by mouth daily.     LORazepam 0.5 MG tablet  Commonly known as:  ATIVAN  Take 1 tablet (0.5 mg total) by mouth 2 (two) times daily as needed for anxiety.     multivitamin with minerals Tabs  Take 1 tablet by mouth daily.     thiamine 100 MG tablet  Take 1 tablet (100 mg total) by mouth daily.      ASK your doctor about these medications       naproxen sodium 220 MG tablet  Commonly known as:  ANAPROX  Take 220 mg by mouth 2 (two) times daily with a meal.       Disposition and follow-up:   Mr.Masin Haney was discharged from [facility] in Stable condition.  At the hospital follow up visit please address: Alcohol cessation and withdrawal and seizures: recent admission for alcohol withdrawal seizures.  Given 2 week supply ativan and referred to [facility] for detox facility for uninsured.  Please follow up on referral or ability to be placed and status of alcohol cessation.   Follow-up Appointments:     Follow-up Information   Follow up with Genelle Gather, MD On 10/21/2012. Joseph Kitchen215)    Contact information:   [address] [place] [address] [phone]      Discharge Orders   Future  Appointments Provider Department Dept Phone   10/21/2012 2:15 PM Genelle Gather, MD [facility] [phone]   Future Orders Complete By Expires     Call MD for:  difficulty breathing, headache or visual disturbances  As directed     Call MD for:  extreme fatigue  As directed     Call MD for:  persistant dizziness or light-headedness  As directed     Call MD for:  severe uncontrolled pain  As directed     Call MD for:  As directed     Comments:      seizures    Diet - low sodium heart healthy  As directed     Increase activity slowly  As directed       Consultations: social work    Procedures Performed:  Dg Chest 2 View  10/12/2012  *RADIOLOGY REPORT*  Clinical Data: Cough.  Query aspiration.  CHEST - 2 VIEW  Comparison: 07/25/2012  Findings: Airway thickening may reflect bronchitis or reactive airways disease.  No airspace opacity is identified to suggest bacterial pneumonia pattern.  Cardiac and mediastinal contours appear unremarkable.  No pleural effusion identified.  IMPRESSION:  1. Airway thickening may reflect bronchitis or reactive airways disease.  No airspace opacity is identified to suggest bacterial  pneumonia pattern.   Original Report Authenticated By: Joseph Haney, M.D.    Ct Head Wo Contrast  10/11/2012  *RADIOLOGY REPORT*  Clinical Data: Witnessed seizure.  CT HEAD WITHOUT CONTRAST  Technique:  Contiguous axial images were obtained from the base of the skull through the vertex without contrast.  Comparison: 07/25/2012  Findings: The brain stem, cerebellum, cerebral peduncles, thalami, basal ganglia, basilar cisterns, and ventricular system appear unremarkable.  No intracranial hemorrhage, mass lesion, or acute infarction is identified.  IMPRESSION:  No significant abnormality identified.   Original Report Authenticated By: Joseph Haney, M.D.    Admission HPI: Patient is a 37 year old male with past medical history of alcohol abuse and  withdrawal seizures presenting with complaints of seizure tonight.  Patient drinks 1 pint of gin a day he reports that he stop drinking 3 days ago cold [place] because he wanted to quit. Tonight he reports he was folding clothes and then blacked out. The next thing he knew, EMS personnel were standing above him. He denies any prodromal symptoms prior to his loss of consciousness. Girlfriend at bedside reports that she was present during episode, the patient fell to the floor and started shaking. She noticed that he appeared to be choking and so she turned him on his side. No bowel or bladder incontinence. This episode lasted about 10 minutes. When EMS arrived, patient refused transport to hospital and his mother brought him to the emergency room.  Currently he feels tired, nauseated, shaky and with headaches. He reports a cough since his episode earlier this evening that is nonproductive. He reports low back pain on his left side from where he struck the floor. He bit his tongue during this episode and continues to experience some pain from this. He also reports left-sided jaw pain, which he says has been chronic since his last seizure.  Patient thinks that he may have had a seizure about 3 weeks ago when he tried to stop drinking, but he was home alone at the time and is unsure about what happened. He remembers waking up on the floor and not remembering what happened to him. No other interval events..  Patient was admitted to [facility] in November for similar presentation. At that time diagnostic workup including CT of his head and EEG were negative for any underlying cause of seizure vulnerability. He was unable to be placed into alcohol detox inpatient due to his insurance status. He was discharged him and reports a couple of weeks of sobriety before he started drinking again. He reports he continues to take his multivitamin which was prescribed for him in hospital discharge.  Denies chest pain,  shortness of breath, abdominal pain, diarrhea, hematuria, hematemesis, lower extremity edema, numbness or weakness.  Hospital Course by problem list:   Alcohol withdrawal seizure--Drinks a pint a day of gin (1 pint = 11 shots) for several years, last drink ~5 days ago. Recent admission in November of 2013 for alcohol withdrawal seizures. Extensive workup at that time including CT of the head and EEG negative for other seizure focus. No metabolic abnormalities, including normal glucose, to suggest alternate precipitant of seizure. CT of head on admission negative for any acute pathology. No more seizures since admission.  CXR negative for any PNA.  He is willing to undergo detox and has been taking multivitamins at home. Social work was consulted and gave multiple options for treatment as an outpatient for individuals without insurance and also made referral to [facility].  He is  to follow up with referral and also instructed to call the facility to inquire when they will be accepting.  He will also need to follow up in [facility] with his PCP and efforts will continue to assist with hopeful acceptance into detox program/facility.  He and his family were given extensive counseling on alcoholism and the importance of cessation.  He claims to be willing to stop and trying to stop.  His fiance and mother also plan to play a bigger role to assist him with his addiction.  He was also advised to try to follow up with AA.  No more seizures during hospital admission.  Maintained on CIWA protocol, fluids, vitamins, and standing dose of ativan to prevent seizures.  He was also discharged on limited dose of ativan until his follow up appointment with his PCP to prevent further seizures.  He was counseled again extensively on not drinking alcohol especially on his medications.  He agreed and his family also agreed.      Alcohol abuse--extensive history of chronic alcohol abuse.  Claims to drink at least a pint of gin per day for several  years.  Has a family history of alcoholism as well.  Has tried to stop in the past but continues to relapse, especially with social factors, stress, and headache.  He is willing to stop and is also looking for a detox facility.  Social work was consulted during admission and provided him resources for assistance and also referred him to [facility].  Follow up pcp and social work advised.      Temporomandibular subluxation?--tender to palpation initially on admission.  Associated with left lateral tongue laceration possibly secondary to seizure activity.  Full ROM of jaw, no clicks.  Using chlorhexidine solution during admission.  No difficulty eating and swallowing.  Will need to follow up with PCP.  Of note, fiance called on 2/14 claiming his jaw seems to be swollen now, painful, and difficulty chewing.  She was instructed to take him to the ED immediately for further evaluation.  May need antibiotics if signs of infection and pain control.     Discharge Vitals:  BP 128/83  Pulse 88  Temp(Src) 98.3 F (36.8 C) (Oral)  Resp 18  Ht 5\' 6"  (1.676 m)  Wt 138 lb 12.8 oz (62.959 kg)  BMI 22.41 kg/m2  SpO2 99%  Discharge Labs:  Results for orders placed during the hospital encounter of 10/11/12 (from the past 24 hour(s))  BASIC METABOLIC PANEL     Status: Abnormal   Collection Time    10/13/12  4:27 AM      Result Value Range   Sodium 140  135 - 145 mEq/L   Potassium 3.5  3.5 - 5.1 mEq/L   Chloride 105  96 - 112 mEq/L   CO2 27  19 - 32 mEq/L   Glucose, Bld 87  70 - 99 mg/dL   BUN 4 (*) 6 - 23 mg/dL   Creatinine, Ser 0.98  0.50 - 1.35 mg/dL   Calcium 9.1  8.4 - 11.9 mg/dL   GFR calc non Af Amer >90  >90 mL/min   GFR calc Af Amer >90  >90 mL/min  CBC     Status: Abnormal   Collection Time    10/13/12  4:27 AM      Result Value Range   WBC 5.6  4.0 - 10.5 K/uL   RBC 3.16 (*) 4.22 - 5.81 MIL/uL   Hemoglobin 10.2 (*) 13.0 - 17.0 g/dL  HCT 29.1 (*) 39.0 - 52.0 %   MCV 92.1  78.0 - 100.0 fL   MCH  32.3  26.0 - 34.0 pg   MCHC 35.1  30.0 - 36.0 g/dL   RDW 91.4 (*) 78.2 - 95.6 %   Platelets 169  150 - 400 K/uL   Signed: Darden Palmer 10/15/2012, 2:37 PM   Time Spent on Discharge: 35 minutes Services Ordered on Discharge: substance abuse treatment option referrals--[facility] and AA Equipment Ordered on Discharge: none

## 2012-10-14 NOTE — Progress Notes (Signed)
Clinical Social Work Department BRIEF PSYCHOSOCIAL ASSESSMENT 10/14/2012  Patient:  Joseph Haney, Joseph Haney     Account Number:  [id number]     Admit date:  10/11/2012  Clinical Social Worker:  Lourdes Sledge  Date/Time:  10/13/2012 01:00 PM  Referred by:  Physician  Date Referred:  10/13/2012 Referred for  Substance Abuse   Other Referral:   Interview type:  Patient Other interview type:   CSW also completed assessment with pt mother and fiance.    PSYCHOSOCIAL DATA Living Status:  FAMILY Admitted from facility:   Level of care:   Primary support name:  Neita Garnet [phone] Primary support relationship to patient:  FRIEND Degree of support available:   Pt has significant support from fiance Kamili and pt mother who are very involved in pt care.    CURRENT CONCERNS Current Concerns  Substance Abuse   Other Concerns:    SOCIAL WORK ASSESSMENT / PLAN Assisting CSW informed that pt ready for discharged and needing substance abuse resources.    CSW visited pt room, introduced herself and role. CSW explored pt hx of substance abuse and daily intake. Pt stated he started drinking when he was 37yo and has since been drinking, "1 pint of Gin everyday." CSW provided active listening and asked several open ended questions. Pt was very receptive and stated he drinks first thing in the morning until he falls asleep. Pt states he has tried several times to "go cold [place]" but has had a very difficult time quitting. CSW explored pt motivation and desire to stop drinking. Pt reports being concerned about his health and knows he needs to stop in order to live. Pt states he is very motivated to stop and is very willing to go into inpatient treatment.    CSW informed that pt is ready for discharge today. CSW contacted several treatment facilities and made necessary referrals. Unfortunately no beds were available however pt was provided with the contact numbers of the facilities and encouraged to  contact them daily to see if a bed is available. Pt was given a bed for [facility] on Oct 27, 2012. Pt will plan to follow up with all treatment facilities to see if any have a bed available sooner.    CSW strongly encouraged pt to attend AA meetings during the week as well as attend individual/group substance abuse counseling. CSW provided pt and family with education regarding addiction and the challenge it is doing it alone. Pt family eager to learn about how to best support patient.   Assessment/plan status:  Psychosocial Support/Ongoing Assessment of Needs Other assessment/ plan:   Information/referral to community resources:   CSW provided pt with information pertaining to substance. CSW given a list of AA meetings, counseling agencies, inpatient and intensive outpatient treatment faciltiies and information on Medicaid. Pt encouraged to follow up with CSW in the internal medicine clinic for additional support.    PATIENT'S/FAMILY'S RESPONSE TO PLAN OF CARE: Pt eager to begin inpatient treatment. Pt is agreaable to any and all support available. This CSW has signed off as pt is dc'ing today and aware of resources.        Theresia Bough, MSW, Theresia Majors [phone]

## 2012-10-15 ENCOUNTER — Encounter (HOSPITAL_COMMUNITY): Payer: Self-pay

## 2012-10-15 ENCOUNTER — Emergency Department (HOSPITAL_COMMUNITY)
Admission: EM | Admit: 2012-10-15 | Discharge: 2012-10-16 | Disposition: A | Discharge status: Home / Self Care | Payer: Self-pay | Attending: Emergency Medicine | Admitting: Emergency Medicine

## 2012-10-15 ENCOUNTER — Telehealth: Payer: Self-pay | Admitting: Internal Medicine

## 2012-10-15 ENCOUNTER — Emergency Department (HOSPITAL_COMMUNITY): Payer: Self-pay

## 2012-10-15 DIAGNOSIS — G40909 Epilepsy, unspecified, not intractable, without status epilepticus: Secondary | ICD-10-CM | POA: Insufficient documentation

## 2012-10-15 DIAGNOSIS — Z79899 Other long term (current) drug therapy: Secondary | ICD-10-CM | POA: Insufficient documentation

## 2012-10-15 DIAGNOSIS — L0201 Cutaneous abscess of face: Secondary | ICD-10-CM | POA: Insufficient documentation

## 2012-10-15 DIAGNOSIS — L03211 Cellulitis of face: Secondary | ICD-10-CM

## 2012-10-15 DIAGNOSIS — Z791 Long term (current) use of non-steroidal anti-inflammatories (NSAID): Secondary | ICD-10-CM | POA: Insufficient documentation

## 2012-10-15 DIAGNOSIS — Z792 Long term (current) use of antibiotics: Secondary | ICD-10-CM | POA: Insufficient documentation

## 2012-10-15 LAB — CBC WITH DIFFERENTIAL/PLATELET
Basophils Absolute: 0 10*3/uL (ref 0.0–0.1)
Basophils Relative: 0 % (ref 0–1)
Eosinophils Absolute: 0 10*3/uL (ref 0.0–0.7)
Eosinophils Relative: 0 % (ref 0–5)
HCT: 30.3 % — ABNORMAL LOW (ref 39.0–52.0)
Hemoglobin: 10.7 g/dL — ABNORMAL LOW (ref 13.0–17.0)
Lymphocytes Relative: 16 % (ref 12–46)
Lymphs Abs: 1.6 K/uL (ref 0.7–4.0)
MCH: 32.6 pg (ref 26.0–34.0)
MCHC: 35.3 g/dL (ref 30.0–36.0)
MCV: 92.4 fL (ref 78.0–100.0)
Monocytes Absolute: 0.7 K/uL (ref 0.1–1.0)
Monocytes Relative: 7 % (ref 3–12)
Neutro Abs: 7.4 K/uL (ref 1.7–7.7)
Neutrophils Relative %: 76 % (ref 43–77)
Platelets: 136 10*3/uL — ABNORMAL LOW (ref 150–400)
RBC: 3.28 MIL/uL — ABNORMAL LOW (ref 4.22–5.81)
RDW: 16.4 % — ABNORMAL HIGH (ref 11.5–15.5)
WBC: 9.7 10*3/uL (ref 4.0–10.5)

## 2012-10-15 LAB — POCT I-STAT, CHEM 8
BUN: 6 mg/dL (ref 6–23)
Calcium, Ion: 1.12 mmol/L (ref 1.12–1.23)
Chloride: 101 meq/L (ref 96–112)
Creatinine, Ser: 0.7 mg/dL (ref 0.50–1.35)
Glucose, Bld: 68 mg/dL — ABNORMAL LOW (ref 70–99)
HCT: 32 % — ABNORMAL LOW (ref 39.0–52.0)
Hemoglobin: 10.9 g/dL — ABNORMAL LOW (ref 13.0–17.0)
Potassium: 3 meq/L — ABNORMAL LOW (ref 3.5–5.1)
Sodium: 138 mEq/L (ref 135–145)
TCO2: 23 mmol/L (ref 0–100)

## 2012-10-15 MED ORDER — NAPROXEN SODIUM 220 MG PO TABS: 220.0000 mg | ORAL_TABLET | Freq: Three times a day (TID) | ORAL | Status: DC

## 2012-10-15 MED ORDER — IOHEXOL 300 MG/ML  SOLN
80.0000 mL | Freq: Once | INTRAMUSCULAR | Status: AC | PRN
  Administered 2012-10-15: 80 mL via INTRAVENOUS

## 2012-10-15 MED ORDER — SODIUM CHLORIDE 0.9 % IV SOLN
Freq: Once | INTRAVENOUS | Status: AC
  Administered 2012-10-15: 23:00:00 via INTRAVENOUS

## 2012-10-15 NOTE — Telephone Encounter (Signed)
Telephone call addendum:  Joseph Haney fiance called the inpatient social worker, Joseph Haney, saying he was not given a prescription for refills of his naproxen that he was taking at home recently upon discharge. Per discharge medications, he was resumed on home meds including naproxen, however, she states he does not currently have any at home and he is having pain in his mouth. Tylenol and Aleve have not helped thus far.  She also explains that his jaw is swollen from the site where he likely bit down during his last seizure and that it is hard for him to eat. She was wondering if she should come to the hospital sooner or wait for the follow up appointment in clinic.   I have put in for the naproxen refill but have also instructed the fiance to bring Joseph Haney to the ED immediately for further evaluation of his jaw and possibly need for stronger medication and/or antibiotics. She agrees and says she will try to convince him to go back to the hospital today after she gets done with HD at this time.

## 2012-10-15 NOTE — ED Notes (Signed)
Patient was seen here 2/10 after having a seizure at home.  His SO stated she rolled him over because he was choking and feel like he may have bit his left inner cheek.  Has noticed some swelling to the left lower jaw.  Spoke with his doctor and told them to come to be seen.

## 2012-10-15 NOTE — ED Notes (Signed)
Pt states he was admitted Monday for seizure and bit his left cheek and tongue. Is back for facial swelling and possible infection

## 2012-10-15 NOTE — ED Provider Notes (Signed)
History     CSN: [id number]  Arrival date & time 10/15/12  1709   First MD Initiated Contact with Patient 10/15/12 2047      Chief Complaint  Patient presents with  . Facial Swelling    (Consider location/radiation/quality/duration/timing/severity/associated sxs/prior treatment) HPI Comments: Patient was recently admitted to the hospital for 3 days secondary to seizures. Patient may have had some oral trauma from his seizures. He was released from the hospital 2 days ago. He reports that yesterday he woke up and had some swelling developing in his left face over his cheek area. He reports some discomfort with trying to open his mouth widely. He denies any recent dental trauma or pain. He denies any fever chills, shortness of breath. He reports no difficulty swallowing. He reports no cervical lymphadenopathy, ear pain, sinus pressure or nasal congestion or drainage. Patient denies smoking history.  The history is provided by the patient and a relative.    Past Medical History  Diagnosis Date  . Seizure     History reviewed. No pertinent past surgical history.  Family History  Problem Relation Age of Onset  . Coronary artery disease Mother   . Coronary artery disease Father   . Heart attack Father 49    History  Substance Use Topics  . Smoking status: Never Smoker   . Smokeless tobacco: Not on file  . Alcohol Use: 0.0 oz/week     Comment: one fifth liquor per week; 80 oz beer/day      Review of Systems  HENT: Negative for hearing loss, ear pain, congestion, sore throat, neck pain, neck stiffness, dental problem, postnasal drip and sinus pressure.   Respiratory: Negative for shortness of breath.   Neurological: Positive for seizures. Negative for headaches.  Hematological: Negative for adenopathy.  All other systems reviewed and are negative.    Allergies  Morphine and related  Home Medications   Current Outpatient Rx  Name  Route  Sig  Dispense  Refill  .  chlorhexidine (PERIDEX) 0.12 % solution   Mouth/Throat   Use as directed 15 mLs in the mouth or throat 2 (two) times daily.   120 mL   0   . docusate sodium (COLACE) 100 MG capsule   Oral   Take 100 mg by mouth daily as needed for constipation.          . folic acid (FOLVITE) 1 MG tablet   Oral   Take 1 tablet (1 mg total) by mouth daily.   30 tablet   11   . LORazepam (ATIVAN) 0.5 MG tablet   Oral   Take 1 tablet (0.5 mg total) by mouth 2 (two) times daily as needed for anxiety.   16 tablet   0   . Multiple Vitamin (MULTIVITAMIN WITH MINERALS) TABS   Oral   Take 1 tablet by mouth daily.   30 tablet   11   . naproxen sodium (ANAPROX) 220 MG tablet   Oral   Take 1 tablet (220 mg total) by mouth 3 (three) times daily with meals.   30 tablet   0   . thiamine 100 MG tablet   Oral   Take 1 tablet (100 mg total) by mouth daily.   30 tablet   11   . clindamycin (CLEOCIN) 150 MG capsule   Oral   Take 1 capsule (150 mg total) by mouth every 6 (six) hours.   28 capsule   0   . HYDROcodone-acetaminophen (NORCO/VICODIN) 5-325  MG per tablet      1-2 tablets po q 6 hours prn moderate to severe pain   20 tablet   0     BP 150/99  Pulse 90  Temp(Src) 99.2 F (37.3 C) (Oral)  Resp 16  SpO2 99%  Physical Exam  Nursing note and vitals reviewed. Constitutional: He is oriented to person, place, and time. He appears well-developed and well-nourished. No distress.  HENT:  Head: Normocephalic and atraumatic.    Mouth/Throat: Uvula is midline, oropharynx is clear and moist and mucous membranes are normal. Normal dentition. No dental caries. No oropharyngeal exudate.  Minimal trismus mainly do to discomfort from the left upper jaw area. No obvious dental abscess and no dental tenderness on palpation.  Eyes: EOM are normal. Pupils are equal, round, and reactive to light. No scleral icterus.  Neck: Normal range of motion and phonation normal. Neck supple.    Cardiovascular: Normal rate and regular rhythm.   Pulmonary/Chest: Effort normal and breath sounds normal. No stridor. No respiratory distress.  Abdominal: Soft. He exhibits no distension. There is no tenderness.  Neurological: He is alert and oriented to person, place, and time. No cranial nerve deficit. Coordination normal.  Skin: Skin is warm. He is not diaphoretic.  Psychiatric: He has a normal mood and affect.    ED Course  Procedures (including critical care time)  Labs Reviewed  CBC WITH DIFFERENTIAL - Abnormal; Notable for the following:    RBC 3.28 (*)    Hemoglobin 10.7 (*)    HCT 30.3 (*)    RDW 16.4 (*)    Platelets 136 (*)    All other components within normal limits  POCT I-STAT, CHEM 8 - Abnormal; Notable for the following:    Potassium 3.0 (*)    Glucose, Bld 68 (*)    Hemoglobin 10.9 (*)    HCT 32.0 (*)    All other components within normal limits   Ct Maxillofacial W/cm  10/15/2012  *RADIOLOGY REPORT*  Clinical Data: Left side of face swelling  CT MAXILLOFACIAL WITH CONTRAST  Technique:  Multidetector CT imaging of the maxillofacial structures was performed with intravenous contrast. Multiplanar CT image reconstructions were also generated.  Contrast: 80mL OMNIPAQUE IOHEXOL 300 MG/ML  SOLN  Comparison: 10/11/2012  Findings: The paranasal sinuses appear clear.  The mastoid air cells are clear.  The facial bones appear intact.  No mass or focal fluid collections identified.  There is significant adenopathy identified.  Mild skin thickening and subcutaneous fat stranding involves the left side of face.  IMPRESSION:  1.  Mild skin thickening and subcutaneous fat stranding involving the left side of face which may indicate cellulitis. 2.  No mass or fluid collections identified.   Original Report Authenticated By: Signa Kell, M.D.      1. Facial cellulitis     Room air saturation is 99% I interpret this to be normal.   12:12 AM I reviewed CT scan results. Will  treat for cellulitis of face.  Can follow up with PCP as outpt next week to assess for improvement.  No airway compromise.    MDM   Patient with mass, possibly parotid enlargement, tumor versus infection or abscess. Patient is afebrile. There is no dental pain to suggest an abscess. My initial impression is that this may be parotid enlargement. There is also the possibility of a traumatic injury from seizure and possibly falling although there is no evidence of significant injury. If there was a hematoma  there, I figure the patient would've had symptoms prior to yesterday. Therefore CT scan of his maxillofacial region with contrast will be obtained.        Gavin Pound. Oletta Lamas, MD 10/16/12 1610

## 2012-10-15 NOTE — Telephone Encounter (Signed)
Mr. Cogdell fiance called saying he was not given a prescription for refills of his naproxen that he was taking at home recently.   He does not currently have any at home and is having pain in his mouth.  She also explains that his jaw is swollen from the site where he likely bit down during his last seizure and that it is hard for him to eat.  She was wondering if she should come to the hospital sooner or wait for the follow up appointment in clinic.    I have put in for the naproxen refill but have also instructed the fiance to bring Joseph Haney to the ED immediately to the ED for further evaluation of his jaw and possibly need for stronger medication and/or antibiotics.  She agrees and says she will try to convince him to go back to the hospital today after she gets done with HD at this time.

## 2012-10-15 NOTE — ED Notes (Signed)
Pt walked to BR

## 2012-10-16 MED ORDER — CLINDAMYCIN PHOSPHATE 600 MG/50ML IV SOLN
600.0000 mg | Freq: Once | INTRAVENOUS | Status: AC
  Administered 2012-10-16: 600 mg via INTRAVENOUS
  Filled 2012-10-16: qty 50

## 2012-10-16 MED ORDER — FENTANYL CITRATE 0.05 MG/ML IJ SOLN
50.0000 ug | Freq: Once | INTRAMUSCULAR | Status: AC
  Administered 2012-10-16: 50 ug via INTRAVENOUS
  Filled 2012-10-16: qty 2

## 2012-10-16 MED ORDER — HYDROCODONE-ACETAMINOPHEN 5-325 MG PO TABS: ORAL_TABLET | ORAL | Status: DC

## 2012-10-16 MED ORDER — CLINDAMYCIN HCL 150 MG PO CAPS: 150.0000 mg | ORAL_CAPSULE | Freq: Four times a day (QID) | ORAL | Status: DC

## 2012-10-16 NOTE — Discharge Instructions (Signed)
 Cellulitis Cellulitis is an infection of the skin and the tissue beneath it. The infected area is usually red and tender. Cellulitis occurs most often in the arms and lower legs.  CAUSES  Cellulitis is caused by bacteria that enter the skin through cracks or cuts in the skin. The most common types of bacteria that cause cellulitis are Staphylococcus and Streptococcus. SYMPTOMS   Redness and warmth.  Swelling.  Tenderness or pain.  Fever. DIAGNOSIS  Your caregiver can usually determine what is wrong based on a physical exam. Blood tests may also be done. TREATMENT  Treatment usually involves taking an antibiotic medicine. HOME CARE INSTRUCTIONS   Take your antibiotics as directed. Finish them even if you start to feel better.  Keep the infected arm or leg elevated to reduce swelling.  Apply a warm cloth to the affected area up to 4 times per day to relieve pain.  Only take over-the-counter or prescription medicines for pain, discomfort, or fever as directed by your caregiver.  Keep all follow-up appointments as directed by your caregiver. SEEK MEDICAL CARE IF:   You notice red streaks coming from the infected area.  Your red area gets larger or turns dark in color.  Your bone or joint underneath the infected area becomes painful after the skin has healed.  Your infection returns in the same area or another area.  You notice a swollen bump in the infected area.  You develop new symptoms. SEEK IMMEDIATE MEDICAL CARE IF:   You have a fever.  You feel very sleepy.  You develop vomiting or diarrhea.  You have a general ill feeling (malaise) with muscle aches and pains. MAKE SURE YOU:   Understand these instructions.  Will watch your condition.  Will get help right away if you are not doing well or get worse. Document Released: 05/28/2005 Document Revised: 02/17/2012 Document Reviewed: 11/03/2011 [facility] Patient Information 2013 [place], [place].   Narcotic and  benzodiazepine use may cause drowsiness, slowed breathing or dependence.  Please use with caution and do not drive, operate machinery or watch young children alone while taking them.  Taking combinations of these medications or drinking alcohol will potentiate these effects.

## 2012-10-21 ENCOUNTER — Ambulatory Visit (INDEPENDENT_AMBULATORY_CARE_PROVIDER_SITE_OTHER): Payer: Self-pay | Admitting: Internal Medicine

## 2012-10-21 ENCOUNTER — Encounter: Payer: Self-pay | Admitting: Internal Medicine

## 2012-10-21 VITALS — BP 145/92 | HR 90 | Temp 98.3°F | Ht 66.0 in | Wt 145.2 lb

## 2012-10-21 DIAGNOSIS — F1010 Alcohol abuse, uncomplicated: Secondary | ICD-10-CM

## 2012-10-21 DIAGNOSIS — F10239 Alcohol dependence with withdrawal, unspecified: Secondary | ICD-10-CM

## 2012-10-21 DIAGNOSIS — F10939 Alcohol use, unspecified with withdrawal, unspecified: Secondary | ICD-10-CM

## 2012-10-21 DIAGNOSIS — M542 Cervicalgia: Secondary | ICD-10-CM

## 2012-10-21 DIAGNOSIS — M549 Dorsalgia, unspecified: Secondary | ICD-10-CM

## 2012-10-21 DIAGNOSIS — E876 Hypokalemia: Secondary | ICD-10-CM

## 2012-10-21 NOTE — Progress Notes (Signed)
Patient ID: Joseph Haney, male   DOB: June 06, 1976, 37 y.o.   MRN: [id number]  Subjective:   Patient ID: Joseph Haney male   DOB: Apr 14, 1976 37 y.o.   MRN: [id number]  HPI: [name] is a 37 y.o. male with PMH alcohol abuse and withdrawal seizures who presents today for a hospital follow up after being admitted for EtOH withdrawal seizures.   In the hospital, Social Work provided cessation counseling and information regarding AA to the patient. He was also referred to in-patient alcohol treatment, and recently a bed opened up for him at [facility] beginning Feb. 26th.   Unfortunately he began drinking after his discharge from the hospital. He states that he is drinking less than before, but is drinking 2 mini bottles of gin a day. He denies any further seizures. His girlfriend is with him today, and she states that she has hidden the Ativan given to him at his hospital discharge and is keeping it from him while he is still drinking.  He was also recently seen in the ED for left sided facial pain and swelling. CT showed possible cellulitis, no mass or abscess. Possibly result of biting down during his last seizure? He was given clindamycin in the ED and Vicodin. Per the patient and his girlfriend, after starting the clindamycin he felt fluid draining from the swollen area on the inside of his mouth. Since that time he has been doing well.   Past Medical History  Diagnosis Date  . Seizure    Current Outpatient Prescriptions  Medication Sig Dispense Refill  . chlorhexidine (PERIDEX) 0.12 % solution Use as directed 15 mLs in the mouth or throat 2 (two) times daily.  120 mL  0  . clindamycin (CLEOCIN) 150 MG capsule Take 1 capsule (150 mg total) by mouth every 6 (six) hours.  28 capsule  0  . docusate sodium (COLACE) 100 MG capsule Take 100 mg by mouth daily as needed for constipation.       . folic acid (FOLVITE) 1 MG tablet Take 1 tablet (1 mg total) by mouth daily.  30 tablet  11  .  HYDROcodone-acetaminophen (NORCO/VICODIN) 5-325 MG per tablet 1-2 tablets po q 6 hours prn moderate to severe pain  20 tablet  0  . LORazepam (ATIVAN) 0.5 MG tablet Take 1 tablet (0.5 mg total) by mouth 2 (two) times daily as needed for anxiety.  16 tablet  0  . Multiple Vitamin (MULTIVITAMIN WITH MINERALS) TABS Take 1 tablet by mouth daily.  30 tablet  11  . naproxen sodium (ANAPROX) 220 MG tablet Take 1 tablet (220 mg total) by mouth 3 (three) times daily with meals.  30 tablet  0  . thiamine 100 MG tablet Take 1 tablet (100 mg total) by mouth daily.  30 tablet  11   No current facility-administered medications for this visit.   Family History  Problem Relation Age of Onset  . Coronary artery disease Mother   . Coronary artery disease Father   . Heart attack Father 51   History   Social History  . Marital Status: Single    Spouse Name: N/A    Number of Children: N/A  . Years of Education: N/A   Social History Main Topics  . Smoking status: Never Smoker   . Smokeless tobacco: None  . Alcohol Use: 0.0 oz/week     Comment: one fifth liquor per week; 80 oz beer/day  . Drug Use: 1.00 per week    Special:  Marijuana  . Sexually Active: None   Other Topics Concern  . None   Social History Narrative  . None   Review of Systems: Constitutional: Denies fever, chills, diaphoresis, appetite change or fatigue.  HEENT: + Neck pain.  Respiratory: Denies SOB, DOE, cough, chest tightness,  or wheezing.   Cardiovascular: Denies chest pain, palpitations or leg swelling.  Gastrointestinal: Denies nausea, vomiting, abdominal pain, diarrhea, constipation, blood in stool or abdominal distention.  Genitourinary: Denies dysuria, urgency, frequency, hematuria, flank pain or difficulty urinating.  Musculoskeletal: + Back pain Skin: Denies pallor, rash and wound.  Neurological: Denies dizziness, seizures, syncope, weakness, light-headedness, numbness or headaches.  Psychiatric/Behavioral: Denies  mood changes  Objective:  Physical Exam: Filed Vitals:   10/21/12 1421  BP: 145/92  Pulse: 90  Temp: 98.3 F (36.8 C)  TempSrc: Oral  Height: 5\' 6"  (1.676 m)  Weight: 145 lb 3.2 oz (65.862 kg)  SpO2: 100%   Constitutional: Vital signs reviewed.  Patient is a well-developed and well-nourished male in no acute distress and cooperative with exam.  HEENT: Normocephalic and atraumatic, PERRL, EOMI. No cervical tenderness to palpation.  Cardiovascular: RRR, S1 normal, S2 normal, no MRG, pulses symmetric and intact bilaterally Pulmonary/Chest: CTAB, no wheezes, rales, or rhonchi Abdominal: Soft. Non-tender, non-distended Musculoskeletal: No joint deformities, erythema, or stiffness, ROM full and no nontender Hematology: no cervical adenopathy.  Neurological: A&O x3, non-focal Skin: Warm, dry and intact. No rash, cyanosis, or clubbing.  Psychiatric: Normal mood and affect. speech and behavior is normal.   Assessment & Plan:   Please refer to Problem List based Assessment and Plan

## 2012-10-21 NOTE — Patient Instructions (Addendum)
You will have a bed available at [facility] is an alcohol rehabilitation facility, on February 26th. Between now and then, you can go to AA (Alcoholics Anonymous) meetings.  If you stop drinking before going to [facility], you can take the Ativan as prescribed prior to your discharge from the hospital. Do not take it while you are still drinking.   Alcohol and Headaches Alcohol is a chemical known as ethanol. It is found in beverages such as beer, wine and liquor. Greater amounts are often found in liquor such as whiskey, vodka, scotch, mixed drinks and others. These drinks can also contain chemicals called congeners. Both ethanol and the congeners can effect how the person feels after drinking it. These "after effects" are often referred to as a hangover. Hangovers are rare with moderate alcohol drinking (1 to 3 average drinks). However, hangovers increase when the amount of alcohol consumed is more than moderate. SYMPTOMS  A hangover can be accompanied by:   Headache.  Upset stomach.  Nausea and vomiting.  Dehydration. A hangover is actually a withdrawal state from moderate to heavy alcohol consumption. The recovery process will take longer if you attempt to relieve this withdrawal with more alcohol. Drinking caffeine may relieve some of the fatigue associated with a hangover. However, this can cause more stomach irritation. Caffeine also makes a person urinate more (diuretic) and can worsen dehydration. TREATMENT  There are a few actions that can reduce a hangover's severity and length.  Drink 1 to 2 glasses of water (16 to 24 ounces) after you have quit drinking alcohol.  Use pain medicines carefully and as told by your caregiver. For a headache, avoid acetaminophen. This drug is hard on the liver. Take aspirin instead and drink more water. If aspirin causes more stomach upset, ibuprofen may be a second choice.  Get rest.  Avoid high-fat foods and consider eating bananas (restores  potassium and magnesium that will help both your stomach and headache). Oranges, apples and pears are good choices too.  Take vitamins. Alcohol depletes the body's stores of vitamins A, B (especially B6) and C, which can intensify hangover symptoms.  Drink 16 ounces of water each hour. This will rehydrate your body and make you feel better. Sports drinks containing electrolytes may help your body rehydrate quicker than drinking water alone. The faster you restore proper fluid balance, the sooner you will feel better. Hydration is vital when treating a hangover.  Exercise. As soon as you feel up to it, sweating helps remove toxins from the body faster. SEEK MEDICAL CARE IF:   Hangovers become more frequent.  Hangovers interfere with major life activities such as job, family, health and relationships.  You are unable to control your drinking, professional help may be needed. Contact your physician for a referral to specialized treatment. SEEK IMMEDIATE MEDICAL CARE IF:   You throw up blood.  You pass dark or tarry stools.  Your headache worsens over the next 24 hours instead getting better.  Your abdominal or stomach pain worsens instead of getting better over the next 24 hours. Document Released: 08/21/2003 Document Revised: 11/10/2011 Document Reviewed: 04/05/2008 [facility] Patient Information 2013 [name], [place].   Alcohol Withdrawal Anytime drug use is interfering with normal living activities it has become abuse. This includes problems with family and friends. Psychological dependence has developed when your mind tells you that the drug is needed. This is usually followed by physical dependence when a continuing increase of drugs are required to get the same feeling or "  high." This is known as addiction or chemical dependency. A person's risk is much higher if there is a history of chemical dependency in the family. Mild Withdrawal Following Stopping Alcohol, When Addiction or  Chemical Dependency Has Developed When a person has developed tolerance to alcohol, any sudden stopping of alcohol can cause uncomfortable physical symptoms. Most of the time these are mild and consist of tremors in the hands and increases in heart rate, breathing, and temperature. Sometimes these symptoms are associated with anxiety, panic attacks, and bad dreams. There may also be stomach upset. Normal sleep patterns are often interrupted with periods of inability to sleep (insomnia). This may last for 6 months. Because of this discomfort, many people choose to continue drinking to get rid of this discomfort and to try to feel normal. Severe Withdrawal with Decreased or No Alcohol Intake, When Addiction or Chemical Dependency Has Developed About five percent of alcoholics will develop signs of severe withdrawal when they stop using alcohol. One sign of this is development of generalized seizures (convulsions). Other signs of this are severe agitation and confusion. This may be associated with believing in things which are not real or seeing things which are not really there (delusions and hallucinations). Vitamin deficiencies are usually present if alcohol intake has been long-term. Treatment for this most often requires hospitalization and close observation. Addiction can only be helped by stopping use of all chemicals. This is hard but may save your life. With continual alcohol use, possible outcomes are usually loss of self respect and esteem, violence, and death. Addiction cannot be cured but it can be stopped. This often requires outside help and the care of professionals. Treatment centers are listed in the yellow pages under Cocaine, Narcotics, and Alcoholics Anonymous. Most hospitals and clinics can refer you to a specialized care center. It is not necessary for you to go through the uncomfortable symptoms of withdrawal. Your caregiver can provide you with medicines that will help you through this  difficult period. Try to avoid situations, friends, or drugs that made it possible for you to keep using alcohol in the past. Learn how to say no. It takes a long period of time to overcome addictions to all drugs, including alcohol. There may be many times when you feel as though you want a drink. After getting rid of the physical addiction and withdrawal, you will have a lessening of the craving which tells you that you need alcohol to feel normal. Call your caregiver if more support is needed. Learn who to talk to in your family and among your friends so that during these periods you can receive outside help. Alcoholics Anonymous (AA) has helped many people over the years. To get further help, contact AA or call your caregiver, counselor, or clergyperson. Al-Anon and Alateen are support groups for friends and family members of an alcoholic. The people who love and care for an alcoholic often need help, too. For information about these organizations, check your phone directory or call a local alcoholism treatment center.  SEEK IMMEDIATE MEDICAL CARE IF:   You have a seizure.  You have a fever.  You experience uncontrolled vomiting or you vomit up blood. This may be bright red or look like black coffee grounds.  You have blood in the stool. This may be bright red or appear as a black, tarry, bad-smelling stool.  You become lightheaded or faint. Do not drive if you feel this way. Have someone else drive you or call 161  for help.  You become more agitated or confused.  You develop uncontrolled anxiety.  You begin to see things that are not really there (hallucinate). Your caregiver has determined that you completely understand your medical condition, and that your mental state is back to normal. You understand that you have been treated for alcohol withdrawal, have agreed not to drink any alcohol for a minimum of 1 day, will not operate a car or other machinery for 24 hours, and have had an  opportunity to ask any questions about your condition. Document Released: 05/28/2005 Document Revised: 11/10/2011 Document Reviewed: 04/05/2008 [facility] Patient Information 2013 [place], [place].   Back Exercises Back exercises help treat and prevent back injuries. The goal of back exercises is to increase the strength of your abdominal and back muscles and the flexibility of your back. These exercises should be started when you no longer have back pain. Back exercises include:  Pelvic Tilt. Lie on your back with your knees bent. Tilt your pelvis until the lower part of your back is against the floor. Hold this position 5 to 10 sec and repeat 5 to 10 times.  Knee to Chest. Pull first 1 knee up against your chest and hold for 20 to 30 seconds, repeat this with the other knee, and then both knees. This may be done with the other leg straight or bent, whichever feels better.  Sit-Ups or Curl-Ups. Bend your knees 90 degrees. Start with tilting your pelvis, and do a partial, slow sit-up, lifting your trunk only 30 to 45 degrees off the floor. Take at least 2 to 3 seconds for each sit-up. Do not do sit-ups with your knees out straight. If partial sit-ups are difficult, simply do the above but with only tightening your abdominal muscles and holding it as directed.  Hip-Lift. Lie on your back with your knees flexed 90 degrees. Push down with your feet and shoulders as you raise your hips a couple inches off the floor; hold for 10 seconds, repeat 5 to 10 times.  Back arches. Lie on your stomach, propping yourself up on bent elbows. Slowly press on your hands, causing an arch in your low back. Repeat 3 to 5 times. Any initial stiffness and discomfort should lessen with repetition over time.  Shoulder-Lifts. Lie face down with arms beside your body. Keep hips and torso pressed to floor as you slowly lift your head and shoulders off the floor. Do not overdo your exercises, especially in the beginning.  Exercises may cause you some mild back discomfort which lasts for a few minutes; however, if the pain is more severe, or lasts for more than 15 minutes, do not continue exercises until you see your caregiver. Improvement with exercise therapy for back problems is slow.  See your caregivers for assistance with developing a proper back exercise program. Document Released: 09/25/2004 Document Revised: 11/10/2011 Document Reviewed: 06/19/2011 [facility] Patient Information 2013 [place], [place].

## 2012-10-21 NOTE — Assessment & Plan Note (Addendum)
His potassium was 3.0 on 10/15/2012. Rechecking a BMP today. Will replace potassium if needed

## 2012-10-21 NOTE — Assessment & Plan Note (Signed)
After his hospital discharge he began drinking again. He drinks 2 mini bottles of gin a day which he states is down from his previous alcohol consumption. I was notified today that that has opened up for him at [facility] for February 26. Gave information to him about the mark today including the contact number. He and his girlfriend state they have the inpatient social worker's card, Theresia Bough, who has arranged this for the patient. They will contact her as well regarding [facility]. I also discussed trying a meeting was between now and the 26th. He was also advised that if he decided to quit drinking alcohol between now and the 26th that he could take the Ativan prescribed to him prior to his hospital discharge. Hopefully his time at [facility] will be beneficial and the results lasting.

## 2012-10-21 NOTE — Assessment & Plan Note (Signed)
Patient complains of back pain stating it from his seizures which began occurring in November. All imaging is negative. Provided a handout regarding back pain and stretching techniques at this clinic visit.

## 2012-10-22 LAB — BASIC METABOLIC PANEL WITH GFR
Calcium: 9.6 mg/dL (ref 8.4–10.5)
GFR, Est African American: >89 mL/min
GFR, Est Non African American: >89 mL/min
Potassium: 3.6 mEq/L (ref 3.5–5.3)
Sodium: 141 mEq/L (ref 135–145)

## 2012-12-08 ENCOUNTER — Encounter: Payer: Self-pay | Admitting: Internal Medicine

## 2012-12-08 DIAGNOSIS — R269 Unspecified abnormalities of gait and mobility: Secondary | ICD-10-CM | POA: Insufficient documentation

## 2013-05-19 ENCOUNTER — Inpatient Hospital Stay (HOSPITAL_COMMUNITY)
Admission: EM | Admit: 2013-05-19 | Discharge: 2013-05-23 | DRG: 371 | Disposition: A | Discharge status: Home / Self Care | Payer: Self-pay | Attending: Internal Medicine | Admitting: Internal Medicine

## 2013-05-19 ENCOUNTER — Encounter (HOSPITAL_COMMUNITY): Payer: Self-pay | Admitting: Emergency Medicine

## 2013-05-19 DIAGNOSIS — F1010 Alcohol abuse, uncomplicated: Secondary | ICD-10-CM

## 2013-05-19 DIAGNOSIS — IMO0002 Reserved for concepts with insufficient information to code with codable children: Secondary | ICD-10-CM

## 2013-05-19 DIAGNOSIS — N179 Acute kidney failure, unspecified: Secondary | ICD-10-CM | POA: Diagnosis present

## 2013-05-19 DIAGNOSIS — E43 Unspecified severe protein-calorie malnutrition: Secondary | ICD-10-CM | POA: Diagnosis present

## 2013-05-19 DIAGNOSIS — A0472 Enterocolitis due to Clostridium difficile, not specified as recurrent: Principal | ICD-10-CM | POA: Diagnosis present

## 2013-05-19 DIAGNOSIS — M549 Dorsalgia, unspecified: Secondary | ICD-10-CM

## 2013-05-19 DIAGNOSIS — F1210 Cannabis abuse, uncomplicated: Secondary | ICD-10-CM | POA: Diagnosis present

## 2013-05-19 DIAGNOSIS — E876 Hypokalemia: Secondary | ICD-10-CM | POA: Diagnosis present

## 2013-05-19 DIAGNOSIS — F10939 Alcohol use, unspecified with withdrawal, unspecified: Secondary | ICD-10-CM

## 2013-05-19 DIAGNOSIS — F10239 Alcohol dependence with withdrawal, unspecified: Secondary | ICD-10-CM

## 2013-05-19 DIAGNOSIS — E872 Acidosis, unspecified: Secondary | ICD-10-CM

## 2013-05-19 DIAGNOSIS — R269 Unspecified abnormalities of gait and mobility: Secondary | ICD-10-CM

## 2013-05-19 DIAGNOSIS — R197 Diarrhea, unspecified: Secondary | ICD-10-CM | POA: Diagnosis present

## 2013-05-19 DIAGNOSIS — G40909 Epilepsy, unspecified, not intractable, without status epilepticus: Secondary | ICD-10-CM | POA: Diagnosis present

## 2013-05-19 DIAGNOSIS — M542 Cervicalgia: Secondary | ICD-10-CM

## 2013-05-19 DIAGNOSIS — Z79899 Other long term (current) drug therapy: Secondary | ICD-10-CM

## 2013-05-19 DIAGNOSIS — R569 Unspecified convulsions: Secondary | ICD-10-CM | POA: Diagnosis present

## 2013-05-19 DIAGNOSIS — F1020 Alcohol dependence, uncomplicated: Secondary | ICD-10-CM | POA: Diagnosis present

## 2013-05-19 LAB — CBC
Hemoglobin: 13.6 g/dL (ref 13.0–17.0)
MCHC: 36.2 g/dL — ABNORMAL HIGH (ref 30.0–36.0)
Platelets: 160 10*3/uL (ref 150–400)
RBC: 4.43 MIL/uL (ref 4.22–5.81)

## 2013-05-19 MED ORDER — SODIUM CHLORIDE 0.9 % IV BOLUS (SEPSIS)
1000.0000 mL | Freq: Once | INTRAVENOUS | Status: AC
  Administered 2013-05-19: 1000 mL via INTRAVENOUS

## 2013-05-19 MED ORDER — HYDROMORPHONE HCL PF 1 MG/ML IJ SOLN
1.0000 mg | Freq: Once | INTRAMUSCULAR | Status: AC
  Administered 2013-05-19: 1 mg via INTRAVENOUS
  Filled 2013-05-19: qty 1

## 2013-05-19 MED ORDER — ONDANSETRON HCL 4 MG/2ML IJ SOLN
4.0000 mg | Freq: Once | INTRAMUSCULAR | Status: AC
  Administered 2013-05-19: 4 mg via INTRAVENOUS
  Filled 2013-05-19: qty 2

## 2013-05-19 NOTE — ED Provider Notes (Signed)
CSN: [id number]     Arrival date & time 05/19/13  2206 History   First MD Initiated Contact with Patient 05/19/13 2222     Chief Complaint  Patient presents with  . Seizures  . Nausea  . Emesis   (Consider location/radiation/quality/duration/timing/severity/associated sxs/prior Treatment) HPI Comments: The patient is a 37 year old male with history of seizures and alcohol abuse who presents today with nausea, vomiting, diarrhea, abdominal pain since Tuesday. His symptoms have been gradually worsening since that time. Nothing makes the symptoms better. He has been unable to eat or drink over the past few days. His emesis is nonbloody. No blood in his stool. Diarrhea is watery. He denies any recent travel, camping trips, recent antibiotic use. He believes that he had a small seizure today. He reports he thinks that is because he has history of seizures and is felt the same. He reports he "took a nap and woke up and then felt weak". He used to take Ativan for his seizures, but has not taken the Ativan recently. He drinks approximately 1/5 of liquor daily. Last drink was 3 days ago. He denies fevers, chills, shortness of breath, chest pain.  The history is provided by the patient. No language interpreter was used.    Past Medical History  Diagnosis Date  . Seizure    History reviewed. No pertinent past surgical history. Family History  Problem Relation Age of Onset  . Coronary artery disease Mother   . Coronary artery disease Father   . Heart attack Father 40   History  Substance Use Topics  . Smoking status: Never Smoker   . Smokeless tobacco: Not on file  . Alcohol Use: 0.0 oz/week     Comment: one fifth liquor per week; 80 oz beer/day    Review of Systems  Constitutional: Negative for fever and chills.  Respiratory: Negative for shortness of breath.   Cardiovascular: Negative for chest pain.  Gastrointestinal: Positive for nausea, vomiting, abdominal pain and diarrhea.  All  other systems reviewed and are negative.    Allergies  Morphine and related  Home Medications   Current Outpatient Rx  Name  Route  Sig  Dispense  Refill  . docusate sodium (COLACE) 100 MG capsule   Oral   Take 100 mg by mouth daily as needed for constipation.          . folic acid (FOLVITE) 1 MG tablet   Oral   Take 1 tablet (1 mg total) by mouth daily.   30 tablet   11   . Multiple Vitamin (MULTIVITAMIN WITH MINERALS) TABS   Oral   Take 1 tablet by mouth daily.   30 tablet   11    BP 140/94  Pulse 132  Temp(Src) 98.9 F (37.2 C) (Oral)  Resp 20  SpO2 100% Physical Exam  Nursing note and vitals reviewed. Constitutional: He is oriented to person, place, and time. He appears well-developed and well-nourished. No distress.  HENT:  Head: Normocephalic and atraumatic.  Right Ear: External ear normal.  Left Ear: External ear normal.  Nose: Nose normal.  Mouth/Throat: Uvula is midline. Mucous membranes are dry.  Eyes: Conjunctivae are normal.  Neck: Normal range of motion. No tracheal deviation present.  Cardiovascular: Normal rate, regular rhythm and normal heart sounds.   Pulmonary/Chest: Effort normal and breath sounds normal. No stridor.  Abdominal: Soft. He exhibits no distension. Bowel sounds are increased. There is generalized tenderness. There is no rebound and no guarding.  Musculoskeletal: Normal  range of motion.  Neurological: He is alert and oriented to person, place, and time.  Skin: Skin is warm and dry. He is not diaphoretic.  Psychiatric: He has a normal mood and affect. His behavior is normal.    ED Course  Procedures (including critical care time) Labs Review Labs Reviewed  CBC - Abnormal; Notable for the following:    HCT 37.6 (*)    MCHC 36.2 (*)    All other components within normal limits  COMPREHENSIVE METABOLIC PANEL - Abnormal; Notable for the following:    Chloride 89 (*)    CO2 16 (*)    Glucose, Bld 135 (*)    Creatinine, Ser  1.51 (*)    Calcium 11.3 (*)    Total Protein 10.3 (*)    AST 60 (*)    GFR calc non Af Amer 57 (*)    GFR calc Af Amer 66 (*)    All other components within normal limits  URINALYSIS, ROUTINE W REFLEX MICROSCOPIC - Abnormal; Notable for the following:    Color, Urine ORANGE (*)    APPearance CLOUDY (*)    Specific Gravity, Urine 1.033 (*)    Hgb urine dipstick TRACE (*)    Bilirubin Urine LARGE (*)    Ketones, ur >80 (*)    Protein, ur >300 (*)    Leukocytes, UA TRACE (*)    All other components within normal limits  URINE MICROSCOPIC-ADD ON - Abnormal; Notable for the following:    Squamous Epithelial / LPF FEW (*)    Casts HYALINE CASTS (*)    All other components within normal limits  LIPASE, BLOOD  ETHANOL  LACTIC ACID, PLASMA   Imaging Review No results found.  MDM  No diagnosis found. Patient presents to ed with n/v/d x 3 days. Pt dry on exam. Anion gap of 32. Pt with creatinine of 1.51 without hx of kidney disease. Will call for admission for hypochloremic metabolic acidosis and renal failure. Vital signs stable. Pain is currently controlled.   Discussed case with Dr. Conley Rolls who agrees to admit.     Mora Bellman, [name] 05/20/13 [phone]

## 2013-05-19 NOTE — ED Notes (Signed)
Patient transported to X-ray 

## 2013-05-19 NOTE — ED Notes (Signed)
Per ems  Nausea, vomiting, diarrhea x3 days thinks he had 2 small SZ  21/2hrs ago

## 2013-05-19 NOTE — ED Notes (Signed)
Bed: WA04 Expected date: 05/19/13 Expected time: 9:48 PM Means of arrival: Ambulance Comments: N,v,diarrhea, seizures

## 2013-05-20 ENCOUNTER — Emergency Department (HOSPITAL_COMMUNITY): Payer: Self-pay

## 2013-05-20 ENCOUNTER — Encounter (HOSPITAL_COMMUNITY): Payer: Self-pay

## 2013-05-20 DIAGNOSIS — E43 Unspecified severe protein-calorie malnutrition: Secondary | ICD-10-CM | POA: Diagnosis present

## 2013-05-20 DIAGNOSIS — F10239 Alcohol dependence with withdrawal, unspecified: Secondary | ICD-10-CM

## 2013-05-20 DIAGNOSIS — N179 Acute kidney failure, unspecified: Secondary | ICD-10-CM

## 2013-05-20 DIAGNOSIS — E872 Acidosis: Secondary | ICD-10-CM | POA: Diagnosis present

## 2013-05-20 DIAGNOSIS — F1010 Alcohol abuse, uncomplicated: Secondary | ICD-10-CM

## 2013-05-20 DIAGNOSIS — R569 Unspecified convulsions: Secondary | ICD-10-CM

## 2013-05-20 DIAGNOSIS — R197 Diarrhea, unspecified: Secondary | ICD-10-CM

## 2013-05-20 LAB — URINALYSIS, ROUTINE W REFLEX MICROSCOPIC
Glucose, UA: NEGATIVE mg/dL
Specific Gravity, Urine: 1.033 — ABNORMAL HIGH (ref 1.005–1.030)
Urobilinogen, UA: 1 mg/dL (ref 0.0–1.0)

## 2013-05-20 LAB — COMPREHENSIVE METABOLIC PANEL
ALT: 36 U/L (ref 0–53)
AST: 60 U/L — ABNORMAL HIGH (ref 0–37)
Alkaline Phosphatase: 64 U/L (ref 39–117)
CO2: 16 mEq/L — ABNORMAL LOW (ref 19–32)
Calcium: 11.3 mg/dL — ABNORMAL HIGH (ref 8.4–10.5)
GFR calc Af Amer: 66 mL/min — ABNORMAL LOW (ref >90)
Glucose, Bld: 135 mg/dL — ABNORMAL HIGH (ref 70–99)
Potassium: 3.5 mEq/L (ref 3.5–5.1)
Sodium: 137 mEq/L (ref 135–145)
Total Protein: 10.3 g/dL — ABNORMAL HIGH (ref 6.0–8.3)

## 2013-05-20 LAB — URINE MICROSCOPIC-ADD ON

## 2013-05-20 LAB — BASIC METABOLIC PANEL
BUN: 11 mg/dL (ref 6–23)
Chloride: 99 mEq/L (ref 96–112)
Creatinine, Ser: 0.93 mg/dL (ref 0.50–1.35)
GFR calc Af Amer: >90 mL/min (ref >90)
Glucose, Bld: 111 mg/dL — ABNORMAL HIGH (ref 70–99)
Potassium: 3.6 mEq/L (ref 3.5–5.1)

## 2013-05-20 LAB — ETHANOL: Alcohol, Ethyl (B): <11 mg/dL (ref 0–11)

## 2013-05-20 LAB — VITAMIN B12: Vitamin B-12: 276 pg/mL (ref 211–911)

## 2013-05-20 LAB — LACTIC ACID, PLASMA: Lactic Acid, Venous: 3 mmol/L — ABNORMAL HIGH (ref 0.5–2.2)

## 2013-05-20 MED ORDER — FOLIC ACID 1 MG PO TABS: 1.0000 mg | ORAL_TABLET | Freq: Every day | ORAL | Status: DC

## 2013-05-20 MED ORDER — FOLIC ACID 1 MG PO TABS
1.0000 mg | ORAL_TABLET | Freq: Every day | ORAL | Status: DC
  Administered 2013-05-20 – 2013-05-23 (×4): 1 mg via ORAL
  Filled 2013-05-20 (×4): qty 1

## 2013-05-20 MED ORDER — IOHEXOL 300 MG/ML  SOLN
100.0000 mL | Freq: Once | INTRAMUSCULAR | Status: AC | PRN
  Administered 2013-05-20: 100 mL via INTRAVENOUS

## 2013-05-20 MED ORDER — METRONIDAZOLE 500 MG PO TABS
500.0000 mg | ORAL_TABLET | Freq: Three times a day (TID) | ORAL | Status: DC
  Administered 2013-05-20 – 2013-05-23 (×9): 500 mg via ORAL
  Filled 2013-05-20 (×11): qty 1

## 2013-05-20 MED ORDER — ONDANSETRON HCL 4 MG/2ML IJ SOLN
4.0000 mg | Freq: Four times a day (QID) | INTRAMUSCULAR | Status: DC | PRN
  Administered 2013-05-20 – 2013-05-22 (×7): 4 mg via INTRAVENOUS
  Filled 2013-05-20 (×8): qty 2

## 2013-05-20 MED ORDER — ENSURE COMPLETE PO LIQD
237.0000 mL | Freq: Every day | ORAL | Status: DC
  Administered 2013-05-20 – 2013-05-22 (×3): 237 mL via ORAL

## 2013-05-20 MED ORDER — ADULT MULTIVITAMIN W/MINERALS CH
1.0000 | ORAL_TABLET | Freq: Every day | ORAL | Status: DC
  Administered 2013-05-20 – 2013-05-23 (×4): 1 via ORAL
  Filled 2013-05-20 (×4): qty 1

## 2013-05-20 MED ORDER — SODIUM CHLORIDE 0.9 % IJ SOLN
3.0000 mL | Freq: Two times a day (BID) | INTRAMUSCULAR | Status: DC
  Administered 2013-05-20 – 2013-05-23 (×2): 3 mL via INTRAVENOUS

## 2013-05-20 MED ORDER — LORAZEPAM 1 MG PO TABS
1.0000 mg | ORAL_TABLET | Freq: Four times a day (QID) | ORAL | Status: AC | PRN
  Administered 2013-05-22 (×2): 1 mg via ORAL
  Filled 2013-05-20 (×2): qty 1

## 2013-05-20 MED ORDER — THIAMINE HCL 100 MG/ML IJ SOLN
100.0000 mg | Freq: Every day | INTRAMUSCULAR | Status: DC
  Filled 2013-05-20 (×4): qty 1

## 2013-05-20 MED ORDER — SODIUM BICARBONATE 8.4 % IV SOLN
INTRAVENOUS | Status: DC
  Administered 2013-05-20 – 2013-05-21 (×3): via INTRAVENOUS
  Filled 2013-05-20 (×9): qty 1000

## 2013-05-20 MED ORDER — HYDROMORPHONE HCL 2 MG PO TABS
2.0000 mg | ORAL_TABLET | ORAL | Status: DC | PRN
  Administered 2013-05-20 – 2013-05-23 (×10): 2 mg via ORAL
  Filled 2013-05-20 (×10): qty 1

## 2013-05-20 MED ORDER — METOCLOPRAMIDE HCL 5 MG/ML IJ SOLN
10.0000 mg | Freq: Once | INTRAMUSCULAR | Status: AC
  Administered 2013-05-20: 10 mg via INTRAVENOUS
  Filled 2013-05-20: qty 2

## 2013-05-20 MED ORDER — LORAZEPAM 2 MG/ML IJ SOLN
1.0000 mg | Freq: Four times a day (QID) | INTRAMUSCULAR | Status: AC | PRN
  Administered 2013-05-22: 02:00:00 via INTRAVENOUS
  Filled 2013-05-20: qty 1

## 2013-05-20 MED ORDER — LORAZEPAM 2 MG/ML IJ SOLN
0.0000 mg | Freq: Four times a day (QID) | INTRAMUSCULAR | Status: AC
  Administered 2013-05-20 – 2013-05-21 (×4): 1 mg via INTRAVENOUS
  Filled 2013-05-20 (×5): qty 1

## 2013-05-20 MED ORDER — HEPARIN SODIUM (PORCINE) 5000 UNIT/ML IJ SOLN
5000.0000 [IU] | Freq: Three times a day (TID) | INTRAMUSCULAR | Status: DC
  Administered 2013-05-20 – 2013-05-23 (×10): 5000 [IU] via SUBCUTANEOUS
  Filled 2013-05-20 (×14): qty 1

## 2013-05-20 MED ORDER — SODIUM CHLORIDE 0.9 % IV SOLN
INTRAVENOUS | Status: DC
  Filled 2013-05-20: qty 1000

## 2013-05-20 MED ORDER — KCL IN DEXTROSE-NACL 40-5-0.45 MEQ/L-%-% IV SOLN
INTRAVENOUS | Status: DC
  Filled 2013-05-20: qty 1000

## 2013-05-20 MED ORDER — MORPHINE SULFATE 2 MG/ML IJ SOLN: 2.0000 mg | INTRAMUSCULAR | Status: DC | PRN

## 2013-05-20 MED ORDER — SODIUM CHLORIDE 0.9 % IV BOLUS (SEPSIS)
1000.0000 mL | Freq: Once | INTRAVENOUS | Status: AC
  Administered 2013-05-20: 1000 mL via INTRAVENOUS

## 2013-05-20 MED ORDER — VITAMIN B-1 100 MG PO TABS
100.0000 mg | ORAL_TABLET | Freq: Every day | ORAL | Status: DC
  Administered 2013-05-20 – 2013-05-23 (×4): 100 mg via ORAL
  Filled 2013-05-20 (×4): qty 1

## 2013-05-20 MED ORDER — IOHEXOL 300 MG/ML  SOLN
50.0000 mL | Freq: Once | INTRAMUSCULAR | Status: AC | PRN
  Administered 2013-05-20: 50 mL via ORAL

## 2013-05-20 MED ORDER — ONDANSETRON HCL 4 MG PO TABS
4.0000 mg | ORAL_TABLET | Freq: Four times a day (QID) | ORAL | Status: DC | PRN
  Administered 2013-05-23: 4 mg via ORAL
  Filled 2013-05-20 (×2): qty 1

## 2013-05-20 MED ORDER — LORAZEPAM 2 MG/ML IJ SOLN
0.0000 mg | Freq: Two times a day (BID) | INTRAMUSCULAR | Status: DC
  Administered 2013-05-20: 1 mg via INTRAVENOUS

## 2013-05-20 NOTE — ED Notes (Signed)
Patient transported to CT 

## 2013-05-20 NOTE — Progress Notes (Signed)
INITIAL NUTRITION ASSESSMENT  Pt meets criteria for severe MALNUTRITION in the context of chronic illness as evidenced by <75% estimated energy intake with 12.6% weight loss in the past 2 months per pt report.  DOCUMENTATION CODES Per approved criteria  -Severe malnutrition in the context of chronic illness   INTERVENTION: - Recommend MD monitor pt's potassium, phosphorus, and magnesium as PO intake improves to monitor for refeeding syndrome r/t pt's report of not eating anything for 2 months (only drinking alcohol) - text paged MD with this request - Ensure Complete only once daily as do not want to give excess carbohydrate as pt at risk for refeeding syndrome - Will continue to monitor   NUTRITION DIAGNOSIS: Inadequate oral intake related to nausea as evidenced by <25% meal intake.    Goal: 1. Refeeding labs WNL as PO intake improves 2. Pt to consume >90% of meals/supplements 3. Resolution of nausea  Monitor:  Weights, labs, intake, nausea, refeeding labs  Reason for Assessment: Nutrition risk   37 y.o. male  Admitting Dx: Possible seizure at home  ASSESSMENT: Pt with history of alcohol abuse (drinks approximately 1/5 of liquor daily) and alcohol withdrawal seizures. Pt getting folic acid, thiamine, and multivitamin.   Pt with nausea, vomiting, and diarrhea x 3 days PTA. Denies any vomiting/diarrhea today but still has nausea, preventing him from eating much PO intake today. Met with pt who reports 20 pound unintended weight loss in the past 2 months r/t pt not eating anything, only drinking alcohol.   Height: Ht Readings from Last 1 Encounters:  05/20/13 5\' 5"  (1.651 m)    Weight: Wt Readings from Last 1 Encounters:  05/20/13 139 lb 14.4 oz (63.458 kg)    Ideal Body Weight: 136 lb  % Ideal Body Weight: 102%  Wt Readings from Last 10 Encounters:  05/20/13 139 lb 14.4 oz (63.458 kg)  10/21/12 145 lb 3.2 oz (65.862 kg)  10/12/12 138 lb 12.8 oz (62.959 kg)   08/05/12 135 lb 14.4 oz (61.644 kg)  07/27/12 129 lb 11.2 oz (58.832 kg)    Usual Body Weight: 139 lb per pt  % Usual Body Weight: 100%  BMI:  Body mass index is 23.28 kg/(m^2).  Estimated Nutritional Needs: Kcal: 1600-1800 Protein: 65-75g Fluid: 1.6-1.8L/day  Skin: Intact   Diet Order: General  EDUCATION NEEDS: -No education needs identified at this time   Intake/Output Summary (Last 24 hours) at 05/20/13 1422 Last data filed at 05/20/13 0700  Gross per 24 hour  Intake 3227.08 ml  Output    380 ml  Net 2847.08 ml    Last BM: 9/18, diarrhea  Labs:   Recent Labs Lab 05/19/13 2310 05/20/13 0925  NA 137 139  K 3.5 3.6  CL 89* 99  CO2 16* 24  BUN 16 11  CREATININE 1.51* 0.93  CALCIUM 11.3* 9.5  GLUCOSE 135* 111*    CBG (last 3)  No results found for this basename: GLUCAP,  in the last 72 hours  Scheduled Meds: . folic acid  1 mg Oral Daily  . heparin  5,000 Units Subcutaneous Q8H  . LORazepam  0-4 mg Intravenous Q6H   Followed by  . [START ON 05/22/2013] LORazepam  0-4 mg Intravenous Q12H  . multivitamin with minerals  1 tablet Oral Daily  . sodium chloride  3 mL Intravenous Q12H  . thiamine  100 mg Oral Daily   Or  . thiamine  100 mg Intravenous Daily    Continuous Infusions: . dextrose  5 % 1,000 mL with potassium chloride 40 mEq, sodium bicarbonate 50 mEq infusion 125 mL/hr at 05/20/13 0411    Past Medical History  Diagnosis Date  . Seizure     History reviewed. No pertinent past surgical history.  Levon Hedger MS, RD, LDN [phone] Pager [phone] After Hours Pager

## 2013-05-20 NOTE — Care Management Note (Unsigned)
    Page 1 of 1   05/20/2013     11:01:05 AM   CARE MANAGEMENT NOTE 05/20/2013  Patient:  Joseph Haney, Joseph Haney   Account Number:  [id number]  Date Initiated:  05/20/2013  Documentation initiated by:  Colleen Can  Subjective/Objective Assessment:   dx hypochorelmia, metabolic acidosis, acute renal injury, poss alcohol withdrawal seizure, volume depletion.     Action/Plan:   Pt is from home.   Anticipated DC Date:  05/22/2013   Anticipated DC Plan:  HOME/SELF CARE  In-house referral  Financial Counselor      DC Planning Services  CM consult      Choice offered to / List presented to:             Status of service:  In process, will continue to follow Medicare Important Message given?   (If response is "NO", the following Medicare IM given date fields will be blank) Date Medicare IM given:   Date Additional Medicare IM given:    Discharge Disposition:    Per UR Regulation:  Reviewed for med. necessity/level of care/duration of stay  If discussed at Long Length of Stay Meetings, dates discussed:    Comments:  05/20/2013 Colleen Can BSN RN CCM Pt currently in ciwa protocol.

## 2013-05-20 NOTE — ED Provider Notes (Signed)
Medical screening examination/treatment/procedure(s) were performed by non-physician practitioner and as supervising physician I was immediately available for consultation/collaboration.  Olivia Mackie, MD 05/20/13 [phone]

## 2013-05-20 NOTE — ED Provider Notes (Signed)
Medical screening examination/treatment/procedure(s) were performed by non-physician practitioner and as supervising physician I was immediately available for consultation/collaboration.  Cleburne Savini M Erico Stan, MD 05/20/13 0604 

## 2013-05-20 NOTE — Progress Notes (Signed)
[facility] PROGRESS NOTE  Joseph Haney ZOX:[id number] DOB: 1976-02-12 DOA: 05/19/2013 PCP: Genelle Gather, MD I have seen and examined pt who is a 37 year old with yo male with arm alcohol abuse and history of alcohol withdrawal seizures admitted this am by Dr Conley Rolls  following a seizure at home. he also reported diarrhea with nausea vomiting x3 days. Today he denies any further nausea vomiting or diarrhea, is alert and oriented x3 with a slight right hand tremor. He denies any complaints at this time, will continue current management plan as per Dr. Conley Rolls, follow studies and further treat as appropriate.   [name]  [facility] Pager [phone]. If 7PM-7AM, please contact night-coverage at [email or web address], password [facility] 05/20/2013, 12:58 PM  LOS: 1 day

## 2013-05-20 NOTE — H&P (Signed)
[facility] History and Physical  [name] BJY:[id number] DOB: May 24, 1976    PCP:   Genelle Gather, MD   Chief Complaint: Possible seizure at home.  HPI: Joseph Haney is an 37 y.o. male with hx of significant alcohol use, hx of alcohol withdrawal seizures, presents to the ER as he had tonic clonic activities at home.  His fiance said he was jerking, then subsequently went to sleep, but no urinary incontinence.  He is alert, orient and conversing in the ER.  He also had  3 days of diarrhea, nausea and vomiting.  He denied fever, chills, abdominal pain, or bloody stool.  He has no ill contact or distant travel.  He hadn't been on any antibiotic recently.  He stopped drinking because being ill, he couldn't.  Work up in the ER showed Cr of 1.5, bicarb of 16 with Chloride of 89, but normal K.  His Calcium was 11.3.  He has normal lipase and unremarkable LFTs.  Hospitalist was asked to admit him because of elevated AG metabolic acidosis (AG 32, Bicarb 16), with volume depletion, possible alcohol withdrawal seizure, and diarrhea.  Rewiew of Systems:  Constitutional: Negative for malaise, fever and chills. No significant weight loss or weight gain Eyes: Negative for eye pain, redness and discharge, diplopia, visual changes, or flashes of light. ENMT: Negative for ear pain, hoarseness, nasal congestion, sinus pressure and sore throat. No headaches; tinnitus, drooling, or problem swallowing. Cardiovascular: Negative for chest pain, palpitations, diaphoresis, dyspnea and peripheral edema. ; No orthopnea, PND Respiratory: Negative for cough, hemoptysis, wheezing and stridor. No pleuritic chestpain. Gastrointestinal: Negative for constipation, abdominal pain, melena, blood in stool, hematemesis, jaundice and rectal bleeding.    Genitourinary: Negative for frequency, dysuria, incontinence,flank pain and hematuria; Musculoskeletal: Negative for back pain and neck pain. Negative for swelling  and trauma.;  Skin: . Negative for pruritus, rash, abrasions, bruising and skin lesion.; ulcerations Neuro: Negative for headache, lightheadedness and neck stiffness. Negative for weakness, altered level of consciousness ,  extremity weakness, burning feet, involuntary movement, and syncope.  Psych: negative for anxiety, depression, insomnia, tearfulness, panic attacks, hallucinations, paranoia, suicidal or homicidal ideation    Past Medical History  Diagnosis Date  . Seizure     History reviewed. No pertinent past surgical history.  Medications:  HOME MEDS: Prior to Admission medications   Medication Sig Start Date End Date Taking? Authorizing Provider  docusate sodium (COLACE) 100 MG capsule Take 100 mg by mouth daily as needed for constipation.    Yes Historical Provider, MD  folic acid (FOLVITE) 1 MG tablet Take 1 tablet (1 mg total) by mouth daily. 07/26/12  Yes Genelle Gather, MD  Multiple Vitamin (MULTIVITAMIN WITH MINERALS) TABS Take 1 tablet by mouth daily. 07/26/12  Yes Genelle Gather, MD     Allergies:  Allergies  Allergen Reactions  . Morphine And Related Itching    Social History:   reports that he has never smoked. He does not have any smokeless tobacco history on file. He reports that  drinks alcohol. He reports that he uses illicit drugs (Marijuana) about once per week.  Family History: Family History  Problem Relation Age of Onset  . Coronary artery disease Mother   . Coronary artery disease Father   . Heart attack Father 7     Physical Exam: Filed Vitals:   05/19/13 2210 05/19/13 2213 05/20/13 0052  BP: 140/94  143/86  Pulse: 132  94  Temp: 98.9 F (37.2 C)  97.7 F (36.5 C)  TempSrc: Oral  Oral  Resp: 20  18  SpO2: 99% 100% 100%   Blood pressure 143/86, pulse 94, temperature 97.7 F (36.5 C), temperature source Oral, resp. rate 18, SpO2 100.00%.  GEN:  Pleasant  patient lying in the stretcher in no acute distress; cooperative with  exam. PSYCH:  alert and oriented x4; does not appear anxious or depressed; affect is appropriate. HEENT: Mucous membranes pink and anicteric; PERRLA; EOM intact; no cervical lymphadenopathy nor thyromegaly or carotid bruit; no JVD; There were no stridor. Neck is very supple. Breasts:: Not examined CHEST WALL: No tenderness CHEST: Normal respiration, clear to auscultation bilaterally.  HEART: Regular rate and rhythm.  There are no murmur, rub, or gallops.   BACK: No kyphosis or scoliosis; no CVA tenderness ABDOMEN: soft and non-tender; no masses, no organomegaly, normal abdominal bowel sounds; no pannus; no intertriginous candida. There is no rebound and no distention. Rectal Exam: Not done EXTREMITIES: No bone or joint deformity; age-appropriate arthropathy of the hands and knees; no edema; no ulcerations.  There is no calf tenderness. Genitalia: not examined PULSES: 2+ and symmetric SKIN: Normal hydration no rash or ulceration CNS: Cranial nerves 2-12 grossly intact no focal lateralizing neurologic deficit.  Speech is fluent; uvula elevated with phonation, facial symmetry and tongue midline. DTR are normal bilaterally, cerebella exam is intact, barbinski is negative and strengths are equaled bilaterally.  No sensory loss.   Labs on Admission:  Basic Metabolic Panel:  Recent Labs Lab 05/19/13 2310  NA 137  K 3.5  CL 89*  CO2 16*  GLUCOSE 135*  BUN 16  CREATININE 1.51*  CALCIUM 11.3*   Liver Function Tests:  Recent Labs Lab 05/19/13 2310  AST 60*  ALT 36  ALKPHOS 64  BILITOT 0.5  PROT 10.3*  ALBUMIN 5.2    Recent Labs Lab 05/19/13 2310  LIPASE 30   No results found for this basename: AMMONIA,  in the last 168 hours CBC:  Recent Labs Lab 05/19/13 2310  WBC 4.8  HGB 13.6  HCT 37.6*  MCV 84.9  PLT 160   Cardiac Enzymes: No results found for this basename: CKTOTAL, CKMB, CKMBINDEX, TROPONINI,  in the last 168 hours  CBG: No results found for this basename:  GLUCAP,  in the last 168 hours   Radiological Exams on Admission: Ct Abdomen Pelvis W Contrast  05/20/2013   CLINICAL DATA:  Abdominal pain nausea  EXAM: CT ABDOMEN AND PELVIS WITH CONTRAST  TECHNIQUE: Multidetector CT imaging of the abdomen and pelvis was performed using the standard protocol following bolus administration of intravenous contrast.  CONTRAST:  OMNIPAQUE IOHEXOL 300 MG/ML  SOLN  COMPARISON:  None.  FINDINGS: BODY WALL: Unremarkable.  LOWER CHEST:  Mediastinum: Unremarkable.  Lungs/pleura: No consolidation.  ABDOMEN/PELVIS:  Liver: Severe and diffuse fatty infiltration without focal abnormality.  Biliary: Distended gallbladder. No radiodense stone. No pericholecystic inflammation.  Pancreas: Unremarkable.  Spleen: Unremarkable.  Adrenals: Unremarkable.  Kidneys and ureters: No hydronephrosis or stone.  Bladder: Unremarkable.  Bowel: Diffuse thickening of the colonic wall, particularly proximally, with the submucosal low density suggesting fatty infiltration. No pericolonic stranding. These changes also affect the appendix. This finding is nonspecific, but can be seen with remote inflammation. The appendix is not dilated and there is no periappendiceal fat stranding to suggest acute appendicitis.  Retroperitoneum: No mass or adenopathy.  Peritoneum: No free fluid or gas.  Reproductive: Unremarkable.  Vascular: No acute abnormality.  OSSEOUS: SI joint arthropathy  with osteophytes suggesting osteoarthritis. No suspicious lytic or blastic lesions.  IMPRESSION: 1. Colonic wall thickening which can be accounted for by fatty infiltration and under distention. Cannot exclude a mild colitis. 2. Marked hepatic steatosis.   Electronically Signed   By: Tiburcio Pea   On: 05/20/2013 01:40    Assessment/Plan Present on Admission:  . Alcohol abuse . Alcohol withdrawal seizure . Diarrhea . AKI (acute kidney injury)  PLAN: Will admit him for possible alcohol withdrawal seizure, metabolic  acidosis from alcohol and diarrhea, along with AKI likely prerenal from volume depletion.  Will place him on CIWA protocol.  Will give IVF with Dextrose, KCL, and bicarb.  He is placed on seizure precaution.  BMET was ordered for recheck at [facility] 05/20/13.  I have sent stool for studies.  Note CT showed colonic wall thickening.  I've sent for C diff, but I don't think it will be positive.  He is stable, full code, and will be admitted to [facility] service.  I encourage him to stop alcohol use.  Thank you for allowing me to partake in the care of this nice patient.  Other plans as per orders.  Code Status:  FULL Unk Lightning, MD. [facility] Pager [phone] 7pm to 7am.  05/20/2013, 1:58 AM

## 2013-05-20 NOTE — ED Provider Notes (Signed)
Care assumed from [facility] [place], [place].    Joseph Haney is a 37 y.o. male presents with hx of EtOH withdrawal seizure.  He has had N/V/D x 3 days for which he has not been able to consume his EtOH.  Pt with epigastric abd pain and concern for pancreatitis vs dehydration vs alcohol withdrawal vs alcohol gastritis.  Normal neuro exam, abd diffusely tender without rebound or guarding.  Pt reports he drinks 1 fifth of liquor per day.  Pt reports that he fell asleep today and awoke feeling weak which he   Plan: pending labs and CT abd.    1:05 AM Pt with elevated bili in the urine, decreased bicarb and AG (32).  Will increase fluids, check lactic acid and EtOH level.  Metabolic acidosis likely from vomiting.  Will admit for further treatment.  Discussed with [facility] who agrees to admit.    Joseph Client Elishia Kaczorowski, [name] 05/20/13 0205

## 2013-05-21 DIAGNOSIS — E43 Unspecified severe protein-calorie malnutrition: Secondary | ICD-10-CM

## 2013-05-21 DIAGNOSIS — E876 Hypokalemia: Secondary | ICD-10-CM

## 2013-05-21 DIAGNOSIS — E872 Acidosis: Secondary | ICD-10-CM

## 2013-05-21 DIAGNOSIS — A0472 Enterocolitis due to Clostridium difficile, not specified as recurrent: Principal | ICD-10-CM

## 2013-05-21 LAB — BASIC METABOLIC PANEL
Calcium: 9.3 mg/dL (ref 8.4–10.5)
GFR calc Af Amer: >90 mL/min (ref >90)
GFR calc non Af Amer: >90 mL/min (ref >90)
Potassium: 2.9 mEq/L — ABNORMAL LOW (ref 3.5–5.1)
Sodium: 134 mEq/L — ABNORMAL LOW (ref 135–145)

## 2013-05-21 LAB — MAGNESIUM: Magnesium: 1.2 mg/dL — ABNORMAL LOW (ref 1.5–2.5)

## 2013-05-21 LAB — PHOSPHORUS: Phosphorus: 1.9 mg/dL — ABNORMAL LOW (ref 2.3–4.6)

## 2013-05-21 MED ORDER — POTASSIUM PHOSPHATE DIBASIC 3 MMOLE/ML IV SOLN
10.0000 mmol | Freq: Once | INTRAVENOUS | Status: AC
  Administered 2013-05-21: 18:00:00 10 mmol via INTRAVENOUS
  Filled 2013-05-21: qty 3.33

## 2013-05-21 MED ORDER — FAMOTIDINE 20 MG PO TABS
20.0000 mg | ORAL_TABLET | Freq: Two times a day (BID) | ORAL | Status: DC
  Administered 2013-05-21 – 2013-05-23 (×5): 20 mg via ORAL
  Filled 2013-05-21 (×6): qty 1

## 2013-05-21 MED ORDER — SODIUM CHLORIDE 0.9 % IV SOLN
INTRAVENOUS | Status: DC
  Administered 2013-05-21: 16:00:00 via INTRAVENOUS

## 2013-05-21 MED ORDER — MAGNESIUM SULFATE 40 MG/ML IJ SOLN
4.0000 g | Freq: Once | INTRAMUSCULAR | Status: AC
  Administered 2013-05-21: 18:00:00 4 g via INTRAVENOUS
  Filled 2013-05-21: qty 100

## 2013-05-21 MED ORDER — POTASSIUM CHLORIDE CRYS ER 20 MEQ PO TBCR
40.0000 meq | EXTENDED_RELEASE_TABLET | ORAL | Status: AC
  Administered 2013-05-21 (×3): 40 meq via ORAL
  Filled 2013-05-21 (×3): qty 2

## 2013-05-21 NOTE — Progress Notes (Signed)
[facility] PROGRESS NOTE  Joseph Haney ZOX:[id number] DOB: February 06, 1976 DOA: 05/19/2013 PCP: Genelle Gather, MD  Assessment/Plan: Active Problems:  Alcohol abuse/Alcohol withdrawal seizure -continue ativan detox protocol -counselled to quit alcohol -SW consult for outpt alcohol rehab C. difficile diarrhea -flagyl started on 9/19, continue and follow -clinically improving Hypomagnesemia -replace mg Hypophosphatemia -replace phos   Hypokalemia -replace k   Metabolic acidosis -resolved, will d/c bicarb   AKI (acute kidney injury) -resolved with hydration   Protein-calorie malnutrition, severe -appreciate dietary assistance  -continue ensure  Code Status: full Family Communication: significant other at bedside Disposition Plan: home when medically stable   Consultants:  none  Procedures:  none  Antibiotics:  Flagyl started 9/19  HPI/Subjective: States decreased diarrhea, but still with nausea and upper abd discomfort  Objective: Filed Vitals:   05/21/13 1053  BP: 138/86  Pulse: 99  Temp: 98.6 F (37 C)  Resp: 16    Intake/Output Summary (Last 24 hours) at 05/21/13 1122 Last data filed at 05/21/13 0815  Gross per 24 hour  Intake   3960 ml  Output      0 ml  Net   3960 ml   Filed Weights   05/20/13 0240  Weight: 63.458 kg (139 lb 14.4 oz)    Exam:  General: alert & oriented x 3 In NAD Cardiovascular: RRR, nl S1 s2 Respiratory: CTAB Abdomen: soft +BS upper abd tenderness/ND, no masses palpable Extremities: No cyanosis and no edema, slight tremor r. hand    Data Reviewed: Basic Metabolic Panel:  Recent Labs Lab 05/19/13 2310 05/20/13 0925 05/21/13 0555  NA 137 139 134*  K 3.5 3.6 2.9*  CL 89* 99 93*  CO2 16* 24 31  GLUCOSE 135* 111* 118*  BUN 16 11 4*  CREATININE 1.51* 0.93 0.72  CALCIUM 11.3* 9.5 9.3   Liver Function Tests:  Recent Labs Lab 05/19/13 2310  AST 60*  ALT 36  ALKPHOS 64  BILITOT 0.5  PROT 10.3*   ALBUMIN 5.2    Recent Labs Lab 05/19/13 2310  LIPASE 30   No results found for this basename: AMMONIA,  in the last 168 hours CBC:  Recent Labs Lab 05/19/13 2310  WBC 4.8  HGB 13.6  HCT 37.6*  MCV 84.9  PLT 160   Cardiac Enzymes: No results found for this basename: CKTOTAL, CKMB, CKMBINDEX, TROPONINI,  in the last 168 hours BNP (last 3 results) No results found for this basename: PROBNP,  in the last 8760 hours CBG: No results found for this basename: GLUCAP,  in the last 168 hours  Recent Results (from the past 240 hour(s))  CLOSTRIDIUM DIFFICILE BY PCR     Status: Abnormal   Collection Time    05/20/13  1:55 PM      Result Value Range Status   C difficile by pcr POSITIVE (*) NEGATIVE Final   Comment: CRITICAL RESULT CALLED TO, READ BACK BY AND VERIFIED WITH:     Olena Heckle RN 15:55 05/20/13 ([email or web address])     Performed at [facility]     Studies: Ct Abdomen Pelvis W Contrast  05/20/2013   CLINICAL DATA:  Abdominal pain nausea  EXAM: CT ABDOMEN AND PELVIS WITH CONTRAST  TECHNIQUE: Multidetector CT imaging of the abdomen and pelvis was performed using the standard protocol following bolus administration of intravenous contrast.  CONTRAST:  OMNIPAQUE IOHEXOL 300 MG/ML  SOLN  COMPARISON:  None.  FINDINGS: BODY WALL: Unremarkable.  LOWER CHEST:  Mediastinum: Unremarkable.  Lungs/pleura: No consolidation.  ABDOMEN/PELVIS:  Liver: Severe and diffuse fatty infiltration without focal abnormality.  Biliary: Distended gallbladder. No radiodense stone. No pericholecystic inflammation.  Pancreas: Unremarkable.  Spleen: Unremarkable.  Adrenals: Unremarkable.  Kidneys and ureters: No hydronephrosis or stone.  Bladder: Unremarkable.  Bowel: Diffuse thickening of the colonic wall, particularly proximally, with the submucosal low density suggesting fatty infiltration. No pericolonic stranding. These changes also affect the appendix. This finding is nonspecific, but can be seen with  remote inflammation. The appendix is not dilated and there is no periappendiceal fat stranding to suggest acute appendicitis.  Retroperitoneum: No mass or adenopathy.  Peritoneum: No free fluid or gas.  Reproductive: Unremarkable.  Vascular: No acute abnormality.  OSSEOUS: SI joint arthropathy with osteophytes suggesting osteoarthritis. No suspicious lytic or blastic lesions.  IMPRESSION: 1. Colonic wall thickening which can be accounted for by fatty infiltration and under distention. Cannot exclude a mild colitis. 2. Marked hepatic steatosis.   Electronically Signed   By: Tiburcio Pea   On: 05/20/2013 01:40    Scheduled Meds: . feeding supplement  237 mL Oral Q1500  . folic acid  1 mg Oral Daily  . heparin  5,000 Units Subcutaneous Q8H  . LORazepam  0-4 mg Intravenous Q6H   Followed by  . [START ON 05/22/2013] LORazepam  0-4 mg Intravenous Q12H  . metroNIDAZOLE  500 mg Oral Q8H  . multivitamin with minerals  1 tablet Oral Daily  . potassium chloride  40 mEq Oral Q4H  . sodium chloride  3 mL Intravenous Q12H  . thiamine  100 mg Oral Daily   Or  . thiamine  100 mg Intravenous Daily   Continuous Infusions:   Active Problems:   Alcohol abuse   Alcohol withdrawal seizure   Diarrhea   Metabolic acidosis   AKI (acute kidney injury)   Protein-calorie malnutrition, severe    Time spent:35    [name]  [facility] Pager [phone]. If 7PM-7AM, please contact night-coverage at [email or web address], password [facility] 05/21/2013, 11:22 AM  LOS: 2 days

## 2013-05-21 NOTE — Progress Notes (Signed)
Pharmacy Consult for Phosphorus Replacement  Labs: K 2.9, Na 134, Phos 1.9  A/P: per policy, will give KPhos 0.49mMol/kg (using 62kg IBW) x 1 dose - . Check phos in AM  Hessie Knows, PharmD, BCPS Pager [phone] 05/21/2013 5:07 PM

## 2013-05-22 LAB — PHOSPHORUS: Phosphorus: 1.7 mg/dL — ABNORMAL LOW (ref 2.3–4.6)

## 2013-05-22 LAB — BASIC METABOLIC PANEL
Calcium: 9.5 mg/dL (ref 8.4–10.5)
Creatinine, Ser: 0.73 mg/dL (ref 0.50–1.35)
GFR calc Af Amer: >90 mL/min (ref >90)
GFR calc non Af Amer: >90 mL/min (ref >90)
Sodium: 133 mEq/L — ABNORMAL LOW (ref 135–145)

## 2013-05-22 LAB — MAGNESIUM: Magnesium: 1.9 mg/dL (ref 1.5–2.5)

## 2013-05-22 MED ORDER — DIPHENHYDRAMINE HCL 25 MG PO CAPS
25.0000 mg | ORAL_CAPSULE | Freq: Once | ORAL | Status: AC
  Administered 2013-05-22: 09:00:00 25 mg via ORAL
  Filled 2013-05-22: qty 1

## 2013-05-22 MED ORDER — SACCHAROMYCES BOULARDII 250 MG PO CAPS
250.0000 mg | ORAL_CAPSULE | Freq: Two times a day (BID) | ORAL | Status: DC
  Administered 2013-05-22 – 2013-05-23 (×2): 250 mg via ORAL
  Filled 2013-05-22 (×3): qty 1

## 2013-05-22 MED ORDER — SODIUM PHOSPHATE 3 MMOLE/ML IV SOLN
20.0000 mmol | Freq: Once | INTRAVENOUS | Status: AC
  Administered 2013-05-22: 20 mmol via INTRAVENOUS
  Filled 2013-05-22: qty 6.67

## 2013-05-22 NOTE — Progress Notes (Signed)
Clinical Social Work Department BRIEF PSYCHOSOCIAL ASSESSMENT 05/22/2013  Patient:  Joseph Haney, Joseph Haney     Account Number:  [id number]     Admit date:  05/19/2013  Clinical Social Worker:  Doroteo Glassman  Date/Time:  05/22/2013 01:56 PM  Referred by:  Physician  Date Referred:  05/22/2013 Referred for  Substance Abuse   Other Referral:   Interview type:  Patient Other interview type:   Pt's mom present    PSYCHOSOCIAL DATA Living Status:  FAMILY Admitted from facility:   Level of care:   Primary support name:  Kamila Primary support relationship to patient:  FAMILY Degree of support available:   adquate    CURRENT CONCERNS Current Concerns  Substance Abuse   Other Concerns:    SOCIAL WORK ASSESSMENT / PLAN Met with Pt and mom at bedside to discuss current admission.    Pt reported that he has been drinking approx a fifth "for a long time."  He stated that he decided to quit on his own, thus the reason for admission.    CSW discussed with Pt the various levels of tx.  Pt is interested in IOP, as his fiancee' is on dialysis and he has to be home in the afternoon while she's gone to meet and be with her 39 year old daughter when she gets off the bus.  Pt stated that IOP would work well for his family's schedule.    CSW provided Pt with [facility]'s IOP information, as well as information on area IOP services.  CSW also discussed with Pt [facility] and [facility] and provided him with information on these resources.  Pt was also given info on [facility] and area substance abuse counselors.    CSW thanked Pt for his time.   Assessment/plan status:  No Further Intervention Required Other assessment/ plan:   Information/referral to community resources:   [facility] IOP  Community IOP info  [facility]  [facility]  [employer]  Community substance abuse counselors    PATIENT'S/FAMILY'S RESPONSE TO PLAN OF CARE: Pt is aware that he has a problem and is wanting tx.  Pt stated, "I'm going to do it."    Pt  thanked CSW for time and assistance.   Providence Crosby, LCSWA Clinical Social Work [phone]

## 2013-05-22 NOTE — Progress Notes (Addendum)
[facility] PROGRESS NOTE  Joseph Haney VHQ:[id number] DOB: Jan 24, 1976 DOA: 05/19/2013 PCP: Genelle Gather, MD  Assessment/Plan: Active Problems:  Alcohol abuse/Alcohol withdrawal seizure -continue ativan detox protocol -counselled to quit alcohol -appreciate SW assistance with outpt alcohol rehab>> patient to followup for outpatient rehabilitation C. difficile diarrhea -flagyl started on 9/19, continue and follow -clinically improving Hypomagnesemia -Resolved Hypophosphatemia -Improving, continue to replace phos   Hypokalemia -resolved   Metabolic acidosis -resolved,  bicarb DC'd on 9/20   AKI (acute kidney injury) -resolved with hydration   Protein-calorie malnutrition, severe -appreciate dietary assistance  -continue ensure  Code Status: full Family Communication: mother at bedside Disposition Plan: home when medically stable   Consultants:  none  Procedures:  none  Antibiotics:  Flagyl started 9/19  HPI/Subjective: -Hallucinations per nursing overnight but alert and appropriate this a.m., diarrhea x1 so for today. Tolerating by mouth better today  Objective: Filed Vitals:   05/22/13 1447  BP: 137/94  Pulse: 94  Temp: 97.5 F (36.4 C)  Resp: 20    Intake/Output Summary (Last 24 hours) at 05/22/13 1847 Last data filed at 05/22/13 1300  Gross per 24 hour  Intake   1760 ml  Output   2101 ml  Net   -341 ml   Filed Weights   05/20/13 0240  Weight: 63.458 kg (139 lb 14.4 oz)    Exam:  General: alert & oriented x 3 In NAD Cardiovascular: RRR, nl S1 s2 Respiratory: CTAB Abdomen: soft +BS upper abd tenderness/ND, no masses palpable Extremities: No cyanosis and no edema, slight tremor r. hand    Data Reviewed: Basic Metabolic Panel:  Recent Labs Lab 05/19/13 2310 05/20/13 0925 05/21/13 0555 05/22/13 0558  NA 137 139 134* 133*  K 3.5 3.6 2.9* 3.8  CL 89* 99 93* 97  CO2 16* 24 31 29   GLUCOSE 135* 111* 118* 97  BUN 16 11 4*  <3*  CREATININE 1.51* 0.93 0.72 0.73  CALCIUM 11.3* 9.5 9.3 9.5  MG  --   --  1.2* 1.9  PHOS  --   --  1.9* 1.7*   Liver Function Tests:  Recent Labs Lab 05/19/13 2310  AST 60*  ALT 36  ALKPHOS 64  BILITOT 0.5  PROT 10.3*  ALBUMIN 5.2    Recent Labs Lab 05/19/13 2310  LIPASE 30   No results found for this basename: AMMONIA,  in the last 168 hours CBC:  Recent Labs Lab 05/19/13 2310  WBC 4.8  HGB 13.6  HCT 37.6*  MCV 84.9  PLT 160   Cardiac Enzymes: No results found for this basename: CKTOTAL, CKMB, CKMBINDEX, TROPONINI,  in the last 168 hours BNP (last 3 results) No results found for this basename: PROBNP,  in the last 8760 hours CBG: No results found for this basename: GLUCAP,  in the last 168 hours  Recent Results (from the past 240 hour(s))  STOOL CULTURE     Status: None   Collection Time    05/20/13  1:55 PM      Result Value Range Status   Specimen Description STOOL   Final   Special Requests Immunocompromised   Final   Culture     Final   Value: NO SUSPICIOUS COLONIES, CONTINUING TO HOLD     Performed at [facility]   Report Status PENDING   Incomplete  CLOSTRIDIUM DIFFICILE BY PCR     Status: Abnormal   Collection Time    05/20/13  1:55 PM  Result Value Range Status   C difficile by pcr POSITIVE (*) NEGATIVE Final   Comment: CRITICAL RESULT CALLED TO, READ BACK BY AND VERIFIED WITH:     Olena Heckle RN 15:55 05/20/13 ([email or web address])     Performed at [facility]     Studies: No results found.  Scheduled Meds: . famotidine  20 mg Oral BID  . feeding supplement  237 mL Oral Q1500  . folic acid  1 mg Oral Daily  . heparin  5,000 Units Subcutaneous Q8H  . LORazepam  0-4 mg Intravenous Q12H  . metroNIDAZOLE  500 mg Oral Q8H  . multivitamin with minerals  1 tablet Oral Daily  . sodium chloride  3 mL Intravenous Q12H  . sodium phosphate  Dextrose 5% IVPB  20 mmol Intravenous Once  . thiamine  100 mg Oral Daily   Or  . thiamine   100 mg Intravenous Daily   Continuous Infusions: . sodium chloride 50 mL/hr at 05/21/13 1612    Active Problems:   Alcohol abuse   Alcohol withdrawal seizure   Hypokalemia   Diarrhea   Metabolic acidosis   AKI (acute kidney injury)   Protein-calorie malnutrition, severe   C. difficile diarrhea    Time spent:25    [name]  [facility] Pager [phone]. If 7PM-7AM, please contact night-coverage at [email or web address], password [facility] 05/22/2013, 6:47 PM  LOS: 3 days

## 2013-05-22 NOTE — Progress Notes (Signed)
INITIAL NUTRITION ASSESSMENT  Pt meets criteria for severe MALNUTRITION in the context of chronic illness as evidenced by <75% estimated energy intake with 12.6% weight loss in the past 2 months per pt report.  DOCUMENTATION CODES Per approved criteria  -Severe malnutrition in the context of chronic illness   INTERVENTION: - Recommend MD monitor pt's potassium, phosphorus, and magnesium as PO intake improves to monitor for refeeding syndrome r/t pt's report of not eating anything for 2 months (only drinking alcohol) - text paged MD with this request - Ensure Complete only once daily as do not want to give excess carbohydrate as pt at risk for refeeding syndrome - Will continue to monitor   NUTRITION DIAGNOSIS: Inadequate oral intake related to nausea as evidenced by <25% meal intake.    Goal: 1. Refeeding labs WNL as PO intake improves 2. Pt to consume >90% of meals/supplements 3. Resolution of nausea  Monitor:  Weights, labs, intake, nausea, refeeding labs  Reason for Assessment: Nutrition risk   37 y.o. male  Admitting Dx: Possible seizure at home  ASSESSMENT: Pt with history of alcohol abuse (drinks approximately 1/5 of liquor daily) and alcohol withdrawal seizures. Pt getting folic acid, thiamine, and multivitamin.   Pt with nausea, vomiting, and diarrhea x 3 days PTA. Denies any vomiting/diarrhea today but still has nausea, preventing him from eating much PO intake today. Met with pt who reports 20 pound unintended weight loss in the past 2 months r/t pt not eating anything, only drinking alcohol.   9/21 Patient's appetite and intake improved.  Drinking one Ensure daily and has started to eat with good intake.  Labs noted.  Potassium low and has been repeated.  Magnesium and phosphorus also low.  Needs continued monitor for refeeding.  Height: Ht Readings from Last 1 Encounters:  05/20/13 5\' 5"  (1.651 m)    Weight: Wt Readings from Last 1 Encounters:  05/20/13 139 lb  14.4 oz (63.458 kg)    Ideal Body Weight: 136 lb  % Ideal Body Weight: 102%  Wt Readings from Last 10 Encounters:  05/20/13 139 lb 14.4 oz (63.458 kg)  10/21/12 145 lb 3.2 oz (65.862 kg)  10/12/12 138 lb 12.8 oz (62.959 kg)  08/05/12 135 lb 14.4 oz (61.644 kg)  07/27/12 129 lb 11.2 oz (58.832 kg)    Usual Body Weight: 139 lb per pt  % Usual Body Weight: 100%  BMI:  Body mass index is 23.28 kg/(m^2).  Estimated Nutritional Needs: Kcal: 1600-1800 Protein: 65-75g Fluid: 1.6-1.8L/day  Skin: Intact   Diet Order: General  EDUCATION NEEDS: -No education needs identified at this time   Intake/Output Summary (Last 24 hours) at 05/22/13 1225 Last data filed at 05/22/13 0525  Gross per 24 hour  Intake   1760 ml  Output   2401 ml  Net   -641 ml    Last BM: 9/18, diarrhea  Labs:   Recent Labs Lab 05/20/13 0925 05/21/13 0555 05/22/13 0558  NA 139 134* 133*  K 3.6 2.9* 3.8  CL 99 93* 97  CO2 24 31 29   BUN 11 4* <3*  CREATININE 0.93 0.72 0.73  CALCIUM 9.5 9.3 9.5  MG  --  1.2* 1.9  PHOS  --  1.9* 1.7*  GLUCOSE 111* 118* 97    CBG (last 3)  No results found for this basename: GLUCAP,  in the last 72 hours  Scheduled Meds: . famotidine  20 mg Oral BID  . feeding supplement  237 mL Oral  Q1500  . folic acid  1 mg Oral Daily  . heparin  5,000 Units Subcutaneous Q8H  . LORazepam  0-4 mg Intravenous Q12H  . metroNIDAZOLE  500 mg Oral Q8H  . multivitamin with minerals  1 tablet Oral Daily  . sodium chloride  3 mL Intravenous Q12H  . thiamine  100 mg Oral Daily   Or  . thiamine  100 mg Intravenous Daily    Continuous Infusions: . sodium chloride 50 mL/hr at 05/21/13 1612    Past Medical History  Diagnosis Date  . Seizure     History reviewed. No pertinent past surgical history.  Oran Rein, RD, LDN Clinical Inpatient Dietitian Pager:  [phone] Weekend and after hours pager:  [phone]

## 2013-05-22 NOTE — Progress Notes (Signed)
Brief Pharmacy note: Phosphorus Replacement  K Phos 10 mMol bolus yesterday for Phos 1.9  Phos level today is 1.7, with K 3.8, Na 133  Will give 20 mMol of Na Phos over 6 hr today  Recheck Phos level in am.  Thank you, Otho Bellows PharmD Pager [phone] 05/22/2013, 2:23 PM

## 2013-05-23 LAB — PHOSPHORUS: Phosphorus: 4.1 mg/dL (ref 2.3–4.6)

## 2013-05-23 MED ORDER — ENSURE COMPLETE PO LIQD: 237.0000 mL | Freq: Every day | ORAL | Status: DC

## 2013-05-23 MED ORDER — FAMOTIDINE 20 MG PO TABS: 20.0000 mg | ORAL_TABLET | Freq: Two times a day (BID) | ORAL | Status: DC

## 2013-05-23 MED ORDER — SACCHAROMYCES BOULARDII 250 MG PO CAPS: 250.0000 mg | ORAL_CAPSULE | Freq: Two times a day (BID) | ORAL | Status: DC

## 2013-05-23 MED ORDER — METRONIDAZOLE 500 MG PO TABS: 500.0000 mg | ORAL_TABLET | Freq: Three times a day (TID) | ORAL | Status: DC

## 2013-05-23 MED ORDER — THIAMINE HCL 100 MG PO TABS: 100.0000 mg | ORAL_TABLET | Freq: Every day | ORAL | Status: DC

## 2013-05-23 MED ORDER — OXYCODONE-ACETAMINOPHEN 5-325 MG PO TABS: 1.0000 | ORAL_TABLET | Freq: Four times a day (QID) | ORAL | Status: DC | PRN

## 2013-05-23 NOTE — Progress Notes (Signed)
Assessment unchanged. Discussed discharge instructions and medications with pt. Pt verbalized understanding. Tele and IV removed prescriptions handed to pt. Pt left via wheelchair accompanied by RN.   Chesley Mires R

## 2013-05-23 NOTE — Progress Notes (Signed)
Brief Pharmacy note: Phosphorus Replacement  K Phos 10 mMol bolus 9/20 for Phos 1.9 Na Phos 20 mMol on 9/21 for Phos 1.7  Phos is 4.1 this am, therefore, no replacement needed today.  Loralee Pacas, PharmD, BCPS Pager: [phone]  05/23/2013, 9:31 AM

## 2013-05-23 NOTE — Progress Notes (Signed)
NUTRITION FOLLOW-UP  Pt meets criteria for severe MALNUTRITION in the context of chronic illness as evidenced by <75% estimated energy intake with 12.6% weight loss in the past 2 months per pt report.  DOCUMENTATION CODES Per approved criteria  -Severe malnutrition in the context of chronic illness   INTERVENTION: Continue Ensure Complete daily and MVI. RD to continue to follow nutrition care plan.  NUTRITION DIAGNOSIS: Inadequate oral intake related to nausea as evidenced by <25% meal intake.    Goal: Refeeding labs WNL as PO intake improves Pt to consume >90% of meals/supplements  Monitor:  Weights, labs, intake, nausea, refeeding labs  ASSESSMENT: Pt with history of alcohol abuse (drinks approximately 1/5 of liquor daily) and alcohol withdrawal seizures. Pt getting folic acid, thiamine, and multivitamin.   Current potassium, phosphorus and magnesium are all WNL at this time. Currently ordered for a Regular diet. Ordered for Ensure Complete po daily. Pt states that he is eating 100% of meals. Currently getting dressed to go home.  Sodium is trending down, at 133.  Height: Ht Readings from Last 1 Encounters:  05/20/13 5\' 5"  (1.651 m)    Weight: Wt Readings from Last 1 Encounters:  05/20/13 139 lb 14.4 oz (63.458 kg)  No new weight available  BMI:  Body mass index is 23.28 kg/(m^2). WNL  Estimated Nutritional Needs: Kcal: 1600-1800 Protein: 65-75g Fluid: 1.6-1.8L/day  Skin: Intact   Diet Order: General  EDUCATION NEEDS: -No education needs identified at this time   Intake/Output Summary (Last 24 hours) at 05/23/13 1115 Last data filed at 05/23/13 0800  Gross per 24 hour  Intake   2330 ml  Output   1700 ml  Net    630 ml    Last BM: 9/22  Labs:   Recent Labs Lab 05/20/13 0925 05/21/13 0555 05/22/13 0558 05/23/13 0540  NA 139 134* 133*  --   K 3.6 2.9* 3.8  --   CL 99 93* 97  --   CO2 24 31 29   --   BUN 11 4* <3*  --   CREATININE 0.93 0.72  0.73  --   CALCIUM 9.5 9.3 9.5  --   MG  --  1.2* 1.9  --   PHOS  --  1.9* 1.7* 4.1  GLUCOSE 111* 118* 97  --     CBG (last 3)  No results found for this basename: GLUCAP,  in the last 72 hours  Scheduled Meds: . famotidine  20 mg Oral BID  . feeding supplement  237 mL Oral Q1500  . folic acid  1 mg Oral Daily  . heparin  5,000 Units Subcutaneous Q8H  . LORazepam  0-4 mg Intravenous Q12H  . metroNIDAZOLE  500 mg Oral Q8H  . multivitamin with minerals  1 tablet Oral Daily  . saccharomyces boulardii  250 mg Oral BID  . sodium chloride  3 mL Intravenous Q12H  . thiamine  100 mg Oral Daily   Or  . thiamine  100 mg Intravenous Daily    Continuous Infusions: . sodium chloride 50 mL/hr at 05/21/13 1612    Jarold Motto MS, RD, LDN Pager: [phone] After-hours pager: [phone]

## 2013-05-23 NOTE — Discharge Summary (Signed)
Physician Discharge Summary  Joseph Haney AVW:[id number] DOB: 07-Mar-1976 DOA: 05/19/2013  PCP: Genelle Gather, MD  Admit date: 05/19/2013 Discharge date: 05/23/2013  Time spent: >30 minutes  Recommendations for Outpatient Follow-up:  Follow-up Information   Please follow up. ([place] outpt clinic[facility] 1-2weeks, call for appt upon discharge)       Please follow up. (Out pt Alcohol rehad as directed per Child psychotherapist)       Discharge Diagnoses:  Active Problems:   Alcohol abuse   Alcohol withdrawal seizure   Hypokalemia   Diarrhea   Metabolic acidosis   AKI (acute kidney injury)   Protein-calorie malnutrition, severe   C. difficile diarrhea   Discharge Condition: improved/stable  Diet recommendation: regular  Filed Weights   05/20/13 0240  Weight: 63.458 kg (139 lb 14.4 oz)    History of present illness:  Joseph Haney is an 37 y.o. male with hx of significant alcohol use, hx of alcohol withdrawal seizures, presents to the ER as he had tonic clonic activities at home. His fiance said he was jerking, then subsequently went to sleep, but no urinary incontinence. He is alert, orient and conversing in the ER. He also had 3 days of diarrhea, nausea and vomiting. He denied fever, chills, abdominal pain, or bloody stool. He has no ill contact or distant travel. He hadn't been on any antibiotic recently. He stopped drinking because being ill, he couldn't. Work up in the ER showed Cr of 1.5, bicarb of 16 with Chloride of 89, but normal K. His Calcium was 11.3. He has normal lipase and unremarkable LFTs. Hospitalist was asked to admit him because of elevated AG metabolic acidosis (AG 32, Bicarb 16), with volume depletion, possible alcohol withdrawal seizure, and diarrhea.   Hospital Course:  Alcohol abuse/Alcohol withdrawal seizure  -pt was placed on ativan detox protocol upon admission, he had mild tremor but  Remained alert and did not have any further seizures while in the  hospital. -he was counselled to quit alcohol  -SW assistance was consulted to provide pt with resources to quit alcohol and he was given info for outpt outpt alcohol rehab>> patient to followup for outpatient rehabilitation -He was alert and mentating well upon dc and was to follow up with his PCP  C. difficile diarrhea  -Pt had diaarhea on admission and C.Diff was done and came back + and he was treated withflagyl started on 9/19 per protocol. -clinically improved>> diarrhea resolved and he was to follow up oupt with PCP Hypomagnesemia  -Secondary to #1, was replaced. Resolved  Hypophosphatemia  -Secondary to #, was replaced. Hypokalemia  -resolved  Metabolic acidosis  -resolved, bicarb DC'd on 9/20  AKI (acute kidney injury)  -resolved with hydration  Protein-calorie malnutrition, severe  - dietary was consulted and recommended nutritional supplements -continue ensure      Procedures:  none  Consultations:  none  Discharge Exam: Filed Vitals:   05/23/13 0500  BP: 123/95  Pulse: 84  Temp: 97.4 F (36.3 C)  Resp: 16   Exam:  General: alert & oriented x 3 In NAD  Cardiovascular: RRR, nl S1 s2  Respiratory: CTAB  Abdomen: soft +BS upper abd tenderness/ND, no masses palpable  Extremities: No cyanosis and no edema, slight tremor r. hand    Discharge Instructions  Discharge Orders   Future Orders Complete By Expires   Diet general  As directed    Discharge instructions  As directed    Comments:     Quit Alcohol  Increase activity slowly  As directed        Medication List         docusate sodium 100 MG capsule  Commonly known as:  COLACE  Take 100 mg by mouth daily as needed for constipation.     famotidine 20 MG tablet  Commonly known as:  PEPCID  Take 1 tablet (20 mg total) by mouth 2 (two) times daily.     feeding supplement Liqd  Take 237 mLs by mouth daily at 3 pm.     folic acid 1 MG tablet  Commonly known as:  FOLVITE  Take 1 tablet (1  mg total) by mouth daily.     metroNIDAZOLE 500 MG tablet  Commonly known as:  FLAGYL  Take 1 tablet (500 mg total) by mouth every 8 (eight) hours.     multivitamin with minerals Tabs tablet  Take 1 tablet by mouth daily.     oxyCODONE-acetaminophen 5-325 MG per tablet  Commonly known as:  ROXICET  Take 1 tablet by mouth every 6 (six) hours as needed for pain.     saccharomyces boulardii 250 MG capsule  Commonly known as:  FLORASTOR  Take 1 capsule (250 mg total) by mouth 2 (two) times daily.     thiamine 100 MG tablet  Take 1 tablet (100 mg total) by mouth daily.       Allergies  Allergen Reactions  . Morphine And Related Itching      The results of significant diagnostics from this hospitalization (including imaging, microbiology, ancillary and laboratory) are listed below for reference.    Significant Diagnostic Studies: Ct Abdomen Pelvis W Contrast  05/20/2013   CLINICAL DATA:  Abdominal pain nausea  EXAM: CT ABDOMEN AND PELVIS WITH CONTRAST  TECHNIQUE: Multidetector CT imaging of the abdomen and pelvis was performed using the standard protocol following bolus administration of intravenous contrast.  CONTRAST:  OMNIPAQUE IOHEXOL 300 MG/ML  SOLN  COMPARISON:  None.  FINDINGS: BODY WALL: Unremarkable.  LOWER CHEST:  Mediastinum: Unremarkable.  Lungs/pleura: No consolidation.  ABDOMEN/PELVIS:  Liver: Severe and diffuse fatty infiltration without focal abnormality.  Biliary: Distended gallbladder. No radiodense stone. No pericholecystic inflammation.  Pancreas: Unremarkable.  Spleen: Unremarkable.  Adrenals: Unremarkable.  Kidneys and ureters: No hydronephrosis or stone.  Bladder: Unremarkable.  Bowel: Diffuse thickening of the colonic wall, particularly proximally, with the submucosal low density suggesting fatty infiltration. No pericolonic stranding. These changes also affect the appendix. This finding is nonspecific, but can be seen with remote inflammation. The appendix is  not dilated and there is no periappendiceal fat stranding to suggest acute appendicitis.  Retroperitoneum: No mass or adenopathy.  Peritoneum: No free fluid or gas.  Reproductive: Unremarkable.  Vascular: No acute abnormality.  OSSEOUS: SI joint arthropathy with osteophytes suggesting osteoarthritis. No suspicious lytic or blastic lesions.  IMPRESSION: 1. Colonic wall thickening which can be accounted for by fatty infiltration and under distention. Cannot exclude a mild colitis. 2. Marked hepatic steatosis.   Electronically Signed   By: Tiburcio Pea   On: 05/20/2013 01:40    Microbiology: Recent Results (from the past 240 hour(s))  STOOL CULTURE     Status: None   Collection Time    05/20/13  1:55 PM      Result Value Range Status   Specimen Description STOOL   Final   Special Requests Immunocompromised   Final   Culture     Final   Value: NO SUSPICIOUS COLONIES, CONTINUING TO HOLD  Performed at [facility]   Report Status PENDING   Incomplete  CLOSTRIDIUM DIFFICILE BY PCR     Status: Abnormal   Collection Time    05/20/13  1:55 PM      Result Value Range Status   C difficile by pcr POSITIVE (*) NEGATIVE Final   Comment: CRITICAL RESULT CALLED TO, READ BACK BY AND VERIFIED WITH:     Olena Heckle RN 15:55 05/20/13 ([email or web address])     Performed at [facility]     Labs: Basic Metabolic Panel:  Recent Labs Lab 05/19/13 2310 05/20/13 0925 05/21/13 0555 05/22/13 0558 05/23/13 0540  NA 137 139 134* 133*  --   K 3.5 3.6 2.9* 3.8  --   CL 89* 99 93* 97  --   CO2 16* 24 31 29   --   GLUCOSE 135* 111* 118* 97  --   BUN 16 11 4* <3*  --   CREATININE 1.51* 0.93 0.72 0.73  --   CALCIUM 11.3* 9.5 9.3 9.5  --   MG  --   --  1.2* 1.9  --   PHOS  --   --  1.9* 1.7* 4.1   Liver Function Tests:  Recent Labs Lab 05/19/13 2310  AST 60*  ALT 36  ALKPHOS 64  BILITOT 0.5  PROT 10.3*  ALBUMIN 5.2    Recent Labs Lab 05/19/13 2310  LIPASE 30   No results found for  this basename: AMMONIA,  in the last 168 hours CBC:  Recent Labs Lab 05/19/13 2310  WBC 4.8  HGB 13.6  HCT 37.6*  MCV 84.9  PLT 160   Cardiac Enzymes: No results found for this basename: CKTOTAL, CKMB, CKMBINDEX, TROPONINI,  in the last 168 hours BNP: BNP (last 3 results) No results found for this basename: PROBNP,  in the last 8760 hours CBG: No results found for this basename: GLUCAP,  in the last 168 hours     Signed:  Ashlley Booher C  [facility] 05/23/2013, 11:06 AM

## 2013-05-23 NOTE — Progress Notes (Signed)
Doctor please note continued elevated BP's - pt states he's never been diagnosed with HTN

## 2013-05-24 LAB — STOOL CULTURE

## 2013-06-09 ENCOUNTER — Encounter: Payer: Self-pay | Admitting: Internal Medicine

## 2013-06-16 ENCOUNTER — Encounter: Payer: Self-pay | Admitting: Internal Medicine

## 2013-06-16 ENCOUNTER — Ambulatory Visit (INDEPENDENT_AMBULATORY_CARE_PROVIDER_SITE_OTHER): Payer: Self-pay | Admitting: Internal Medicine

## 2013-06-16 VITALS — BP 124/86 | HR 89 | Temp 97.8°F | Ht 66.75 in | Wt 152.9 lb

## 2013-06-16 DIAGNOSIS — F1010 Alcohol abuse, uncomplicated: Secondary | ICD-10-CM

## 2013-06-16 MED ORDER — FOLIC ACID 1 MG PO TABS: 1.0000 mg | ORAL_TABLET | Freq: Every day | ORAL | Status: DC

## 2013-06-16 MED ORDER — ADULT MULTIVITAMIN W/MINERALS CH: 1.0000 | ORAL_TABLET | Freq: Every day | ORAL | Status: DC

## 2013-06-16 MED ORDER — THIAMINE HCL 100 MG PO TABS: 100.0000 mg | ORAL_TABLET | Freq: Every day | ORAL | Status: DC

## 2013-06-16 NOTE — Assessment & Plan Note (Addendum)
Patient has been off alcohol since the middle September. He continues to abstain from use. He states that he is going to use resources provided to him by the hospital social worker, and is going to contact his local Alcoholics Anonymous sometime soon. I encouraged him and his fiance to go ahead and contact them today, as sooner the better. His fiance ulcer requested the contact number for Lynnae January, R. outpatient clinic social worker, and I gave her Shana's card. Patient also is is to continue taking his multivitamin, thiamine, and folic acid; this prescriptions were refilled today.

## 2013-06-16 NOTE — Progress Notes (Signed)
Patient ID: Joseph Haney, male   DOB: 01/31/1976, 37 y.o.   MRN: [id number]  Subjective:   Patient ID: Joseph Haney male   DOB: Feb 10, 1976 37 y.o.   MRN: [id number]  HPI: [name] is a 37 y.o. M with PMH alcohol abuse and withdrawal seizures who presents today for a hospital follow up after being admitted for EtOH withdrawal seizures.   His last clinic visit in February, 2014 he was to begin inpatient alcohol treatment and a mark beginning February 26th. Unfortunately he decided not to pursue that avenue. He was able to abstain from alcohol for about to 3 months, but then states he "got in with the wrong crowd" and began drinking again. He says he was drinking about a fifth of liquor a day.  In September he traveled to [place] where he states he became ill with nausea vomiting. He returned home and continued with nausea vomiting and developed diarrhea with fevers that occur for about 3 days, and a possible seizure witnessed by his fiance; per pt, he states that he did fall 2/2 to a seizure and fell on his back and hit his head. He presented to the emergency department on 9/18. He was admitted with dehydration, and was found to be C. difficile positive. He was stated on abx, and he was discharged from hospital on 05/23/13. He completed his antibiotic course, and states he's feeling much better. He continues to abstain from alcohol, and has not had any EtOH since prior to his hospitalization in Sept.   He has not followed up with the outpatient substance abuse counseling resources provided by [facility] while in the hospital, but states that he is going to contact AA.    Past Medical History  Diagnosis Date  . Seizure    Current Outpatient Prescriptions  Medication Sig Dispense Refill  . docusate sodium (COLACE) 100 MG capsule Take 100 mg by mouth daily as needed for constipation.       . famotidine (PEPCID) 20 MG tablet Take 1 tablet (20 mg total) by mouth 2 (two) times daily.  60 tablet  0   . feeding supplement (ENSURE COMPLETE) LIQD Take 237 mLs by mouth daily at 3 pm.      . folic acid (FOLVITE) 1 MG tablet Take 1 tablet (1 mg total) by mouth daily.  30 tablet  11  . metroNIDAZOLE (FLAGYL) 500 MG tablet Take 1 tablet (500 mg total) by mouth every 8 (eight) hours.  36 tablet  0  . Multiple Vitamin (MULTIVITAMIN WITH MINERALS) TABS Take 1 tablet by mouth daily.  30 tablet  11  . oxyCODONE-acetaminophen (ROXICET) 5-325 MG per tablet Take 1 tablet by mouth every 6 (six) hours as needed for pain.  20 tablet  0  . saccharomyces boulardii (FLORASTOR) 250 MG capsule Take 1 capsule (250 mg total) by mouth 2 (two) times daily.  30 capsule  0  . thiamine 100 MG tablet Take 1 tablet (100 mg total) by mouth daily.  30 tablet  0   No current facility-administered medications for this visit.   Family History  Problem Relation Age of Onset  . Coronary artery disease Mother   . Coronary artery disease Father   . Heart attack Father 33   History   Social History  . Marital Status: Single    Spouse Name: N/A    Number of Children: N/A  . Years of Education: N/A   Social History Main Topics  . Smoking status: Never Smoker   .  Smokeless tobacco: None  . Alcohol Use: 0.0 oz/week     Comment: one fifth liquor per week; 80 oz beer/day  . Drug Use: 1.00 per week    Special: Marijuana  . Sexual Activity: None   Other Topics Concern  . None   Social History Narrative  . None   Review of Systems: Constitutional: Denies fever, chills, diaphoresis, appetite change and fatigue.  HEENT: Denies photophobia, eye pain, redness, hearing loss, ear pain, congestion, sore throat, rhinorrhea, sneezing, mouth sores, trouble swallowing, neck pain, neck stiffness and tinnitus.   Respiratory: Denies SOB, DOE, cough, chest tightness,  and wheezing.   Cardiovascular: Denies chest pain, palpitations and leg swelling.  Gastrointestinal: Denies nausea, vomiting, abdominal pain, diarrhea, constipation,  blood in stool and abdominal distention.  Genitourinary: Denies dysuria, urgency, frequency, hematuria, flank pain and difficulty urinating.  Endocrine: Denies: hot or cold intolerance, sweats, changes in hair or nails, polyuria, polydipsia. Musculoskeletal: Endorses back pain from recent fall, improving. Denies myalgias, back pain, joint swelling, arthralgias and gait problem.  Skin: Denies pallor, rash and wound.  Neurological: Denies dizziness, seizures, syncope, weakness, light-headedness, numbness and headaches.  Hematological: Denies adenopathy. Easy bruising, personal or family bleeding history  Psychiatric/Behavioral: Denies suicidal ideation, mood changes, confusion, nervousness, sleep disturbance and agitation  Objective:  Physical Exam: Filed Vitals:   06/16/13 0915  BP: 124/86  Pulse: 89  Temp: 97.8 F (36.6 C)  TempSrc: Oral  Height: 5' 6.75" (1.695 m)  Weight: 152 lb 14.4 oz (69.355 kg)  SpO2: 100%   Constitutional: Vital signs reviewed.  Patient is a well-developed and well-nourished male in no acute distress and cooperative with exam. Alert and oriented x3.  Head: Normocephalic and atraumatic. Nontender Eyes: PERRL, EOMI, conjunctivae normal, No scleral icterus.  Neck: Supple, Trachea midline normal ROM, No JVD, mass, thyromegaly, or carotid bruit present.  Cardiovascular: RRR, S1 normal, S2 normal, no MRG, pulses symmetric and intact bilaterally Pulmonary/Chest: normal respiratory effort, CTAB, no wheezes, rales, or rhonchi Abdominal: Soft. Non-tender, non-distended, bowel sounds are normal, no masses, organomegaly, or guarding present.  Musculoskeletal: No joint deformities, erythema, or stiffness, ROM full and no nontender. Spine and soft tissue surrounding are nontender to palpation Hematology: no cervical adenopathy.  Neurological: A&O x3, Strength is normal and symmetric bilaterally, cranial nerve II-XII are grossly intact, no focal motor deficit, sensory intact  to light touch bilaterally.  Skin: Warm, dry and intact. No rash, cyanosis, or clubbing.  Psychiatric: Normal mood and affect. speech and behavior is normal. Judgment and thought content normal. Cognition and memory are normal.   Assessment & Plan:   Please refer to Problem List based Assessment and Plan

## 2013-06-16 NOTE — Patient Instructions (Signed)
Back Exercises Back exercises help treat and prevent back injuries. The goal of back exercises is to increase the strength of your abdominal and back muscles and the flexibility of your back. These exercises should be started when you no longer have back pain. Back exercises include:  Pelvic Tilt. Lie on your back with your knees bent. Tilt your pelvis until the lower part of your back is against the floor. Hold this position 5 to 10 sec and repeat 5 to 10 times.  Knee to Chest. Pull first 1 knee up against your chest and hold for 20 to 30 seconds, repeat this with the other knee, and then both knees. This may be done with the other leg straight or bent, whichever feels better.  Sit-Ups or Curl-Ups. Bend your knees 90 degrees. Start with tilting your pelvis, and do a partial, slow sit-up, lifting your trunk only 30 to 45 degrees off the floor. Take at least 2 to 3 seconds for each sit-up. Do not do sit-ups with your knees out straight. If partial sit-ups are difficult, simply do the above but with only tightening your abdominal muscles and holding it as directed.  Hip-Lift. Lie on your back with your knees flexed 90 degrees. Push down with your feet and shoulders as you raise your hips a couple inches off the floor; hold for 10 seconds, repeat 5 to 10 times.  Back arches. Lie on your stomach, propping yourself up on bent elbows. Slowly press on your hands, causing an arch in your low back. Repeat 3 to 5 times. Any initial stiffness and discomfort should lessen with repetition over time.  Shoulder-Lifts. Lie face down with arms beside your body. Keep hips and torso pressed to floor as you slowly lift your head and shoulders off the floor. Do not overdo your exercises, especially in the beginning. Exercises may cause you some mild back discomfort which lasts for a few minutes; however, if the pain is more severe, or lasts for more than 15 minutes, do not continue exercises until you see your caregiver.  Improvement with exercise therapy for back problems is slow.  See your caregivers for assistance with developing a proper back exercise program. Document Released: 09/25/2004 Document Revised: 11/10/2011 Document Reviewed: 06/19/2011 [facility] Patient Information 2014 [place], [place]. Alcohol Problems Most adults who drink alcohol drink in moderation (not a lot) are at low risk for developing problems related to their drinking. However, all drinkers, including low-risk drinkers, should know about the health risks connected with drinking alcohol. RECOMMENDATIONS FOR LOW-RISK DRINKING  Drink in moderation. Moderate drinking is defined as follows:   Men - no more than 2 drinks per day.  Nonpregnant women - no more than 1 drink per day.  Over age 58 - no more than 1 drink per day. A standard drink is 12 grams of pure alcohol, which is equal to a 12 ounce bottle of beer or wine cooler, a 5 ounce glass of wine, or 1.5 ounces of distilled spirits (such as whiskey, brandy, vodka, or rum).  ABSTAIN FROM (DO NOT DRINK) ALCOHOL:  When pregnant or considering pregnancy.  When taking a medication that interacts with alcohol.  If you are alcohol dependent.  A medical condition that prohibits drinking alcohol (such as ulcer, liver disease, or heart disease). DISCUSS WITH YOUR CAREGIVER:  If you are at risk for coronary heart disease, discuss the potential benefits and risks of alcohol use: Light to moderate drinking is associated with lower rates of coronary heart disease  in certain populations (for example, men over age 67 and postmenopausal women). Infrequent or nondrinkers are advised not to begin light to moderate drinking to reduce the risk of coronary heart disease so as to avoid creating an alcohol-related problem. Similar protective effects can likely be gained through proper diet and exercise.  Women and the elderly have smaller amounts of body water than men. As a result women and the elderly  achieve a higher blood alcohol concentration after drinking the same amount of alcohol.  Exposing a fetus to alcohol can cause a broad range of birth defects referred to as Fetal Alcohol Syndrome (FAS) or Alcohol-Related Birth Defects (ARBD). Although FAS/ARBD is connected with excessive alcohol consumption during pregnancy, studies also have reported neurobehavioral problems in infants born to mothers reporting drinking an average of 1 drink per day during pregnancy.  Heavier drinking (the consumption of more than 4 drinks per occasion by men and more than 3 drinks per occasion by women) impairs learning (cognitive) and psychomotor functions and increases the risk of alcohol-related problems, including accidents and injuries. CAGE QUESTIONS:   Have you ever felt that you should Cut down on your drinking?  Have people Annoyed you by criticizing your drinking?  Have you ever felt bad or Guilty about your drinking?  Have you ever had a drink first thing in the morning to steady your nerves or get rid of a hangover (Eye opener)? If you answered positively to any of these questions: You may be at risk for alcohol-related problems if alcohol consumption is:   Men: Greater than 14 drinks per week or more than 4 drinks per occasion.  Women: Greater than 7 drinks per week or more than 3 drinks per occasion. Do you or your family have a medical history of alcohol-related problems, such as:  Blackouts.  Sexual dysfunction.  Depression.  Trauma.  Liver dysfunction.  Sleep disorders.  Hypertension.  Chronic abdominal pain.  Has your drinking ever caused you problems, such as problems with your family, problems with your work (or school) performance, or accidents/injuries?  Do you have a compulsion to drink or a preoccupation with drinking?  Do you have poor control or are you unable to stop drinking once you have started?  Do you have to drink to avoid withdrawal symptoms?  Do you  have problems with withdrawal such as tremors, nausea, sweats, or mood disturbances?  Does it take more alcohol than in the past to get you high?  Do you feel a strong urge to drink?  Do you change your plans so that you can have a drink?  Do you ever drink in the morning to relieve the shakes or a hangover? If you have answered a number of the previous questions positively, it may be time for you to talk to your caregivers, family, and friends and see if they think you have a problem. Alcoholism is a chemical dependency that keeps getting worse and will eventually destroy your health and relationships. Many alcoholics end up dead, impoverished, or in prison. This is often the end result of all chemical dependency.  Do not be discouraged if you are not ready to take action immediately.  Decisions to change behavior often involve up and down desires to change and feeling like you cannot decide.  Try to think more seriously about your drinking behavior.  Think of the reasons to quit. WHERE TO GO FOR ADDITIONAL INFORMATION   The National Institute on Alcohol Abuse and Alcoholism (NIAAA) BasicStudents.dk  [employer] on Alcoholism and Drug Dependence (NCADD) www.ncadd.org  American Society of Addiction Medicine (ASAM) RoyalDiary.gl  Document Released: 08/18/2005 Document Revised: 11/10/2011 Document Reviewed: 04/05/2008 [facility] Patient Information 2014 [place], [place].

## 2013-06-16 NOTE — Progress Notes (Signed)
Case discussed with Dr. Glenn soon after the resident saw the patient.  We reviewed the resident's history and exam and pertinent patient test results.  I agree with the assessment, diagnosis, and plan of care documented in the resident's note. 

## 2013-06-25 ENCOUNTER — Ambulatory Visit: Payer: Self-pay | Admitting: Emergency Medicine

## 2013-06-25 VITALS — BP 138/96 | HR 99 | Temp 98.6°F | Resp 16 | Ht 65.25 in | Wt 156.2 lb

## 2013-06-25 DIAGNOSIS — Z0289 Encounter for other administrative examinations: Secondary | ICD-10-CM

## 2013-06-25 DIAGNOSIS — Z0000 Encounter for general adult medical examination without abnormal findings: Secondary | ICD-10-CM

## 2013-06-25 NOTE — Progress Notes (Signed)
  Subjective:    Patient ID: Joseph Haney, male    DOB: 1976-02-15, 37 y.o.   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 He has a history of substance abuse and withdrawal seizures. He was admitted in February with a withdrawal seizure at that time he was referred for alcohol rehabilitation and to a substance abuse counselor. He is subsequently not done this    Review of Systems     Objective:   Physical Exam patient is alert and cooperative today he does not appear intoxicated. His HEENT exam is unremarkable his neck is supple. Chest is clear to auscultation and percussion heart regular rate no murmurs abdomen soft nontender liver and spleen not enlarged        Assessment & Plan:  Patient does not qualify for DOT. I told him he should see a substance abuse counselor and start going to meetings. At approximately one year of sobriety he could be evaluated by a substance abuse counselor and cleared by neurologist that he does not have a seizure disorder and that he could be evaluated to see if he qualifies for  A CDL

## 2013-07-05 ENCOUNTER — Ambulatory Visit: Payer: Self-pay

## 2013-12-15 ENCOUNTER — Encounter: Payer: Self-pay | Admitting: Internal Medicine

## 2015-05-19 ENCOUNTER — Encounter (HOSPITAL_COMMUNITY): Payer: Self-pay | Admitting: Emergency Medicine

## 2015-05-19 ENCOUNTER — Emergency Department (HOSPITAL_COMMUNITY)
Admission: EM | Admit: 2015-05-19 | Discharge: 2015-05-19 | Discharge status: Against Medical Advice | Payer: Medicaid Other | Attending: Emergency Medicine | Admitting: Emergency Medicine

## 2015-05-19 DIAGNOSIS — E860 Dehydration: Secondary | ICD-10-CM | POA: Diagnosis not present

## 2015-05-19 DIAGNOSIS — M549 Dorsalgia, unspecified: Secondary | ICD-10-CM | POA: Insufficient documentation

## 2015-05-19 NOTE — ED Notes (Signed)
Pt reports possibly having a seizure tonight. Does not take seizure or HTN medications. Reports possibly falling. C/o head pain and back pain. Denies suicidal/homicidal thoughts. Endorses drinking 1/2 of a beer but denies liquor intake. Pt also complaining of hiccups. Eyes are reddened and patient smells of ETOH. No other c/c.

## 2015-05-19 NOTE — ED Notes (Signed)
Per EMS-pt reports he "may have had two seizures." Unwitnessed. Alert and oriented for EMS. No post-ictal symptoms noted. Ambulatory. VS: 156/98 HR 120 CBG 99 mg/dl. Pt requesting pain medications. No other c/c.

## 2015-05-19 NOTE — ED Notes (Signed)
Called  No response from lobby 

## 2015-05-19 NOTE — ED Notes (Signed)
Called x 2  No response from lobby

## 2015-05-21 ENCOUNTER — Encounter (HOSPITAL_COMMUNITY): Payer: Self-pay | Admitting: Emergency Medicine

## 2015-05-21 ENCOUNTER — Inpatient Hospital Stay (HOSPITAL_COMMUNITY): Payer: Medicaid Other

## 2015-05-21 ENCOUNTER — Emergency Department (HOSPITAL_COMMUNITY): Payer: Medicaid Other

## 2015-05-21 ENCOUNTER — Inpatient Hospital Stay (HOSPITAL_COMMUNITY)
Admission: EM | Admit: 2015-05-21 | Discharge: 2015-05-22 | DRG: 101 | Disposition: A | Discharge status: Home / Self Care | Payer: Medicaid Other | Attending: Internal Medicine | Admitting: Internal Medicine

## 2015-05-21 DIAGNOSIS — S060X9A Concussion with loss of consciousness of unspecified duration, initial encounter: Secondary | ICD-10-CM | POA: Diagnosis present

## 2015-05-21 DIAGNOSIS — G40509 Epileptic seizures related to external causes, not intractable, without status epilepticus: Principal | ICD-10-CM | POA: Diagnosis present

## 2015-05-21 DIAGNOSIS — G40909 Epilepsy, unspecified, not intractable, without status epilepticus: Secondary | ICD-10-CM | POA: Diagnosis not present

## 2015-05-21 DIAGNOSIS — M542 Cervicalgia: Secondary | ICD-10-CM | POA: Diagnosis present

## 2015-05-21 DIAGNOSIS — W19XXXA Unspecified fall, initial encounter: Secondary | ICD-10-CM | POA: Diagnosis present

## 2015-05-21 DIAGNOSIS — F10229 Alcohol dependence with intoxication, unspecified: Secondary | ICD-10-CM | POA: Diagnosis present

## 2015-05-21 DIAGNOSIS — M549 Dorsalgia, unspecified: Secondary | ICD-10-CM | POA: Diagnosis present

## 2015-05-21 DIAGNOSIS — E871 Hypo-osmolality and hyponatremia: Secondary | ICD-10-CM | POA: Diagnosis present

## 2015-05-21 DIAGNOSIS — S060XAA Concussion with loss of consciousness status unknown, initial encounter: Secondary | ICD-10-CM | POA: Diagnosis present

## 2015-05-21 DIAGNOSIS — Z885 Allergy status to narcotic agent status: Secondary | ICD-10-CM | POA: Diagnosis not present

## 2015-05-21 DIAGNOSIS — I10 Essential (primary) hypertension: Secondary | ICD-10-CM | POA: Diagnosis present

## 2015-05-21 DIAGNOSIS — E872 Acidosis: Secondary | ICD-10-CM | POA: Diagnosis present

## 2015-05-21 DIAGNOSIS — Y908 Blood alcohol level of 240 mg/100 ml or more: Secondary | ICD-10-CM | POA: Diagnosis present

## 2015-05-21 DIAGNOSIS — R066 Hiccough: Secondary | ICD-10-CM | POA: Diagnosis present

## 2015-05-21 DIAGNOSIS — R000 Tachycardia, unspecified: Secondary | ICD-10-CM | POA: Diagnosis present

## 2015-05-21 DIAGNOSIS — E860 Dehydration: Secondary | ICD-10-CM | POA: Diagnosis present

## 2015-05-21 DIAGNOSIS — F1010 Alcohol abuse, uncomplicated: Secondary | ICD-10-CM | POA: Diagnosis not present

## 2015-05-21 DIAGNOSIS — E8729 Other acidosis: Secondary | ICD-10-CM | POA: Diagnosis present

## 2015-05-21 DIAGNOSIS — F10239 Alcohol dependence with withdrawal, unspecified: Secondary | ICD-10-CM

## 2015-05-21 DIAGNOSIS — F1290 Cannabis use, unspecified, uncomplicated: Secondary | ICD-10-CM | POA: Diagnosis present

## 2015-05-21 DIAGNOSIS — R109 Unspecified abdominal pain: Secondary | ICD-10-CM | POA: Diagnosis present

## 2015-05-21 DIAGNOSIS — F10230 Alcohol dependence with withdrawal, uncomplicated: Secondary | ICD-10-CM | POA: Diagnosis not present

## 2015-05-21 DIAGNOSIS — R569 Unspecified convulsions: Secondary | ICD-10-CM | POA: Insufficient documentation

## 2015-05-21 HISTORY — DX: Alcohol abuse, uncomplicated: F10.10

## 2015-05-21 HISTORY — DX: Gastro-esophageal reflux disease without esophagitis: K21.9

## 2015-05-21 LAB — RAPID URINE DRUG SCREEN, HOSP PERFORMED
Amphetamines: NOT DETECTED
BENZODIAZEPINES: NOT DETECTED
Barbiturates: NOT DETECTED
COCAINE: NOT DETECTED
Opiates: POSITIVE — AB
Tetrahydrocannabinol: NOT DETECTED

## 2015-05-21 LAB — I-STAT CHEM 8, ED
BUN: <3 mg/dL — ABNORMAL LOW (ref 6–20)
Calcium, Ion: 0.99 mmol/L — ABNORMAL LOW (ref 1.12–1.23)
Chloride: 93 mmol/L — ABNORMAL LOW (ref 101–111)
Creatinine, Ser: 0.8 mg/dL (ref 0.61–1.24)
Glucose, Bld: 72 mg/dL (ref 65–99)
HCT: 40 % (ref 39.0–52.0)
Hemoglobin: 13.6 g/dL (ref 13.0–17.0)
Potassium: 3.5 mmol/L (ref 3.5–5.1)
Sodium: 132 mmol/L — ABNORMAL LOW (ref 135–145)
TCO2: 20 mmol/L (ref 0–100)

## 2015-05-21 LAB — COMPREHENSIVE METABOLIC PANEL
ALT: 50 U/L (ref 17–63)
ANION GAP: 17 — AB (ref 5–15)
AST: 133 U/L — ABNORMAL HIGH (ref 15–41)
Albumin: 4.2 g/dL (ref 3.5–5.0)
Alkaline Phosphatase: 88 U/L (ref 38–126)
BUN: <5 mg/dL — ABNORMAL LOW (ref 6–20)
CHLORIDE: 88 mmol/L — AB (ref 101–111)
CO2: 21 mmol/L — AB (ref 22–32)
Calcium: 9 mg/dL (ref 8.9–10.3)
Creatinine, Ser: 0.76 mg/dL (ref 0.61–1.24)
GFR calc non Af Amer: >60 mL/min (ref >60)
Glucose, Bld: 105 mg/dL — ABNORMAL HIGH (ref 65–99)
Potassium: 3.8 mmol/L (ref 3.5–5.1)
SODIUM: 126 mmol/L — AB (ref 135–145)
Total Bilirubin: 1 mg/dL (ref 0.3–1.2)
Total Protein: 8.3 g/dL — ABNORMAL HIGH (ref 6.5–8.1)

## 2015-05-21 LAB — URINALYSIS, ROUTINE W REFLEX MICROSCOPIC
Bilirubin Urine: NEGATIVE
Glucose, UA: NEGATIVE mg/dL
Hgb urine dipstick: NEGATIVE
Ketones, ur: 15 mg/dL — AB
Leukocytes, UA: NEGATIVE
Nitrite: NEGATIVE
Protein, ur: NEGATIVE mg/dL
Specific Gravity, Urine: 1.004 — ABNORMAL LOW (ref 1.005–1.030)
Urobilinogen, UA: 1 mg/dL (ref 0.0–1.0)
pH: 6 (ref 5.0–8.0)

## 2015-05-21 LAB — CBC WITH DIFFERENTIAL/PLATELET
Basophils Absolute: 0 10*3/uL (ref 0.0–0.1)
Basophils Relative: 0 %
EOS PCT: 0 %
Eosinophils Absolute: 0 10*3/uL (ref 0.0–0.7)
HCT: 38.9 % — ABNORMAL LOW (ref 39.0–52.0)
Hemoglobin: 14.3 g/dL (ref 13.0–17.0)
LYMPHS ABS: 1.2 10*3/uL (ref 0.7–4.0)
Lymphocytes Relative: 19 %
MCH: 33.8 pg (ref 26.0–34.0)
MCHC: 36.8 g/dL — AB (ref 30.0–36.0)
MCV: 92 fL (ref 78.0–100.0)
MONOS PCT: 11 %
Monocytes Absolute: 0.7 10*3/uL (ref 0.1–1.0)
Neutro Abs: 4.4 10*3/uL (ref 1.7–7.7)
Neutrophils Relative %: 70 %
Platelets: 187 10*3/uL (ref 150–400)
RBC: 4.23 MIL/uL (ref 4.22–5.81)
RDW: 12.3 % (ref 11.5–15.5)
WBC: 6.3 10*3/uL (ref 4.0–10.5)

## 2015-05-21 LAB — TROPONIN I

## 2015-05-21 LAB — BASIC METABOLIC PANEL
ANION GAP: 17 — AB (ref 5–15)
CHLORIDE: 96 mmol/L — AB (ref 101–111)
CO2: 19 mmol/L — AB (ref 22–32)
Calcium: 8.4 mg/dL — ABNORMAL LOW (ref 8.9–10.3)
Creatinine, Ser: 0.69 mg/dL (ref 0.61–1.24)
GFR calc Af Amer: >60 mL/min (ref >60)
GLUCOSE: 86 mg/dL (ref 65–99)
POTASSIUM: 3.6 mmol/L (ref 3.5–5.1)
Sodium: 132 mmol/L — ABNORMAL LOW (ref 135–145)

## 2015-05-21 LAB — ETHANOL
Alcohol, Ethyl (B): 292 mg/dL — ABNORMAL HIGH (ref <5)
Alcohol, Ethyl (B): <5 mg/dL (ref <5)

## 2015-05-21 LAB — CBG MONITORING, ED: GLUCOSE-CAPILLARY: 90 mg/dL (ref 65–99)

## 2015-05-21 MED ORDER — ALUM & MAG HYDROXIDE-SIMETH 200-200-20 MG/5ML PO SUSP
30.0000 mL | Freq: Four times a day (QID) | ORAL | Status: DC | PRN
  Administered 2015-05-21: 30 mL via ORAL
  Filled 2015-05-21: qty 30

## 2015-05-21 MED ORDER — DEXTROSE-NACL 5-0.45 % IV SOLN
INTRAVENOUS | Status: DC
  Administered 2015-05-21: 06:00:00 via INTRAVENOUS

## 2015-05-21 MED ORDER — LORAZEPAM 2 MG/ML IJ SOLN
1.0000 mg | Freq: Once | INTRAMUSCULAR | Status: AC
  Administered 2015-05-21: 1 mg via INTRAVENOUS
  Filled 2015-05-21: qty 1

## 2015-05-21 MED ORDER — SODIUM CHLORIDE 0.9 % IV BOLUS (SEPSIS): 1000.0000 mL | Freq: Once | INTRAVENOUS | Status: DC

## 2015-05-21 MED ORDER — ENOXAPARIN SODIUM 40 MG/0.4ML ~~LOC~~ SOLN
40.0000 mg | SUBCUTANEOUS | Status: DC
Start: 2015-05-21 — End: 2015-05-22
  Administered 2015-05-21 – 2015-05-22 (×2): 40 mg via SUBCUTANEOUS
  Filled 2015-05-21 (×2): qty 0.4

## 2015-05-21 MED ORDER — LORAZEPAM 1 MG PO TABS: 0.0000 mg | ORAL_TABLET | Freq: Four times a day (QID) | ORAL | Status: DC

## 2015-05-21 MED ORDER — SODIUM CHLORIDE 0.9 % IV BOLUS (SEPSIS)
2000.0000 mL | Freq: Once | INTRAVENOUS | Status: AC
  Administered 2015-05-21: 2000 mL via INTRAVENOUS

## 2015-05-21 MED ORDER — ONDANSETRON HCL 4 MG/2ML IJ SOLN
4.0000 mg | Freq: Four times a day (QID) | INTRAMUSCULAR | Status: DC | PRN
  Administered 2015-05-21: 4 mg via INTRAVENOUS
  Filled 2015-05-21: qty 2

## 2015-05-21 MED ORDER — SODIUM CHLORIDE 0.9 % IJ SOLN: 3.0000 mL | Freq: Two times a day (BID) | INTRAMUSCULAR | Status: DC

## 2015-05-21 MED ORDER — LORAZEPAM 1 MG PO TABS: 0.0000 mg | ORAL_TABLET | Freq: Two times a day (BID) | ORAL | Status: DC

## 2015-05-21 MED ORDER — VITAMIN B-1 100 MG PO TABS
100.0000 mg | ORAL_TABLET | Freq: Once | ORAL | Status: AC
  Administered 2015-05-21: 100 mg via ORAL
  Filled 2015-05-21: qty 1

## 2015-05-21 MED ORDER — DIVALPROEX SODIUM 125 MG PO CSDR
750.0000 mg | DELAYED_RELEASE_CAPSULE | Freq: Two times a day (BID) | ORAL | Status: DC
  Administered 2015-05-21 – 2015-05-22 (×3): 750 mg via ORAL
  Filled 2015-05-21 (×3): qty 6

## 2015-05-21 MED ORDER — THIAMINE HCL 100 MG/ML IJ SOLN: 100.0000 mg | Freq: Every day | INTRAMUSCULAR | Status: DC

## 2015-05-21 MED ORDER — DEXTROSE-NACL 5-0.9 % IV SOLN
INTRAVENOUS | Status: DC
  Administered 2015-05-21: 06:00:00 via INTRAVENOUS
  Administered 2015-05-21: 1000 mL via INTRAVENOUS
  Administered 2015-05-22: 01:00:00 via INTRAVENOUS
  Administered 2015-05-22: 100 mL via INTRAVENOUS

## 2015-05-21 MED ORDER — LORAZEPAM 2 MG/ML IJ SOLN
0.0000 mg | Freq: Two times a day (BID) | INTRAMUSCULAR | Status: DC
Start: 2015-05-21 — End: 2015-05-22
  Administered 2015-05-21 – 2015-05-22 (×2): 4 mg via INTRAVENOUS
  Filled 2015-05-21: qty 2

## 2015-05-21 MED ORDER — ACETAMINOPHEN 650 MG RE SUPP: 650.0000 mg | Freq: Four times a day (QID) | RECTAL | Status: DC | PRN

## 2015-05-21 MED ORDER — LORAZEPAM 2 MG/ML IJ SOLN
0.0000 mg | Freq: Four times a day (QID) | INTRAMUSCULAR | Status: DC
  Filled 2015-05-21: qty 2

## 2015-05-21 MED ORDER — OXYCODONE HCL 5 MG PO TABS
5.0000 mg | ORAL_TABLET | ORAL | Status: DC | PRN
  Administered 2015-05-21: 5 mg via ORAL
  Filled 2015-05-21: qty 1

## 2015-05-21 MED ORDER — VITAMIN B-1 100 MG PO TABS
100.0000 mg | ORAL_TABLET | Freq: Every day | ORAL | Status: DC
  Administered 2015-05-21 – 2015-05-22 (×2): 100 mg via ORAL
  Filled 2015-05-21 (×2): qty 1

## 2015-05-21 MED ORDER — SODIUM BICARBONATE 650 MG PO TABS
650.0000 mg | ORAL_TABLET | Freq: Two times a day (BID) | ORAL | Status: DC
  Administered 2015-05-21 – 2015-05-22 (×3): 650 mg via ORAL
  Filled 2015-05-21 (×3): qty 1

## 2015-05-21 MED ORDER — CARVEDILOL 12.5 MG PO TABS
12.5000 mg | ORAL_TABLET | Freq: Two times a day (BID) | ORAL | Status: DC
  Administered 2015-05-21 – 2015-05-22 (×2): 12.5 mg via ORAL
  Filled 2015-05-21 (×2): qty 1

## 2015-05-21 MED ORDER — METOPROLOL TARTRATE 1 MG/ML IV SOLN
5.0000 mg | INTRAVENOUS | Status: AC
  Administered 2015-05-21: 5 mg via INTRAVENOUS

## 2015-05-21 MED ORDER — OXYCODONE HCL 5 MG PO TABS
5.0000 mg | ORAL_TABLET | Freq: Once | ORAL | Status: AC
  Administered 2015-05-21: 5 mg via ORAL
  Filled 2015-05-21: qty 1

## 2015-05-21 MED ORDER — ACETAMINOPHEN 325 MG PO TABS
650.0000 mg | ORAL_TABLET | Freq: Four times a day (QID) | ORAL | Status: DC | PRN
  Administered 2015-05-21 – 2015-05-22 (×3): 650 mg via ORAL
  Filled 2015-05-21 (×3): qty 2

## 2015-05-21 MED ORDER — SODIUM CHLORIDE 0.9 % IV SOLN: INTRAVENOUS | Status: DC

## 2015-05-21 MED ORDER — ENOXAPARIN SODIUM 150 MG/ML ~~LOC~~ SOLN: 1.0000 mg/kg | Freq: Two times a day (BID) | SUBCUTANEOUS | Status: DC

## 2015-05-21 MED ORDER — ONDANSETRON HCL 4 MG PO TABS: 4.0000 mg | ORAL_TABLET | Freq: Four times a day (QID) | ORAL | Status: DC | PRN

## 2015-05-21 MED ORDER — METOPROLOL TARTRATE 1 MG/ML IV SOLN
INTRAVENOUS | Status: AC
  Filled 2015-05-21: qty 5

## 2015-05-21 NOTE — ED Notes (Signed)
Patient transported to CT 

## 2015-05-21 NOTE — Procedures (Signed)
ELECTROENCEPHALOGRAM REPORT   Patient: Joseph Haney       Room #: BW38 Age: 39 y.o.        Sex: male Referring Physician: Dr Arnoldo Morale [facility] Report Date:  05/21/2015        Interpreting Physician: Hulen Luster  History: [name] is an 39 y.o. male hx of EtOH abuse admitted with suspected alcohol withdrawal seizures  Medications:  ativan  Conditions of Recording:  This is a 19 channel EEG carried out with the patient in the drowsy state.  Description:  The waking background activity consists of a low voltage, symmetrical, fairly well organized, mix of beta and alpha activity, seen from the parieto-occipital and posterior temporal regions. No focal slowing or epileptiform activity noted. The patient drowses with slowing to irregular, low voltage theta and beta activity.    Hyperventilation produced a mild to moderate buildup but failed to elicit any abnormalities. Intermittent photic stimulation was performed but failed to illicit any change in the tracing.   IMPRESSION: Normal electroencephalogram, awake and with activation procedures. There are no focal lateralizing or epileptiform features. Presence of diffuse beta activity likely related to benzodiazepine usage.    Jim Like, DO [facility] [phone]  If 7pm- 7am, please page neurology on call as listed in [place]. 05/21/2015, 10:02 AM

## 2015-05-21 NOTE — Progress Notes (Signed)
EEG Completed; Results Pending  

## 2015-05-21 NOTE — ED Notes (Signed)
Pt has managed to keep fluids down, passed fluid challenge.

## 2015-05-21 NOTE — ED Provider Notes (Signed)
CSN: [id number]     Arrival date & time 05/21/15  0009 History  This chart was scribed for Merryl Hacker, MD by Ludger Nutting, ED Scribe. This patient was seen in room A07C/A07C and the patient's care was started 12:23 AM.    Chief Complaint  Patient presents with  . Seizures    The history is provided by the patient. No language interpreter was used.    HPI Comments: Joseph Haney is a 39 y.o. male with past medical history of seizures who presents to the Emergency Department complaining of 2 unwitnessed seizures that occurred earlier today. Patient states he recalls shaking and then falling, striking his head on the dresser. He reports LOC and woke up not recalling what happened. He reports not taking his unspecified anti-seizure medication for the past 2 months since he has run out. He reports associated head pain, neck pain, abdominal pain, and back pain that began after the fall. He also complains of hiccups currently. He reports drinking 1 beer in the past 2 days. He denies chest pain, SOB.   Patient with a history of alcohol withdrawal seizures. No noted seizure medications on file.  Past Medical History  Diagnosis Date  . Seizure    History reviewed. No pertinent past surgical history. Family History  Problem Relation Age of Onset  . Coronary artery disease Mother   . Coronary artery disease Father   . Heart attack Father 44   Social History  Substance Use Topics  . Smoking status: Never Smoker   . Smokeless tobacco: None  . Alcohol Use: 0.0 oz/week     Comment: one fifth liquor per week; 80 oz beer/day    Review of Systems  Constitutional: Negative.  Negative for fever.  Respiratory: Negative.  Negative for chest tightness and shortness of breath.   Cardiovascular: Negative.  Negative for chest pain.  Gastrointestinal: Positive for abdominal pain. Negative for nausea, vomiting and diarrhea.  Genitourinary: Negative.  Negative for dysuria.  Musculoskeletal:  Positive for back pain and neck pain.  Neurological: Positive for seizures. Negative for weakness, numbness and headaches.  Psychiatric/Behavioral: Negative for confusion.  All other systems reviewed and are negative.     Allergies  Morphine and related  Home Medications   Prior to Admission medications   Medication Sig Start Date End Date Taking? Authorizing Provider  docusate sodium (COLACE) 100 MG capsule Take 100 mg by mouth daily.     Historical Provider, MD  folic acid (FOLVITE) 1 MG tablet Take 1 mg by mouth daily.    Historical Provider, MD   BP 115/73 mmHg  Pulse 103  Temp(Src) 98 F (36.7 C) (Oral)  Resp 20  SpO2 98% Physical Exam  Constitutional: He is oriented to person, place, and time. No distress.  HENT:  Head: Normocephalic and atraumatic.  Eyes: Pupils are equal, round, and reactive to light.  Injected conjunctiva  Neck: Normal range of motion. Neck supple.  Tenderness palpation over the lower spine C-spine, no step off or deformity  Cardiovascular: Regular rhythm and normal heart sounds.   No murmur heard. Tachycardia  Pulmonary/Chest: Effort normal and breath sounds normal. No respiratory distress. He has no wheezes.  Abdominal: Soft. Bowel sounds are normal. There is no tenderness. There is no rebound.  Musculoskeletal: He exhibits no edema.  Neurological: He is alert and oriented to person, place, and time.  Cranial nerves II through XII intact, 5 out of 5 strength in all 4 extremities, no dysmetria to finger-nose-finger  Skin: Skin is warm and dry.  Psychiatric: He has a normal mood and affect.  Nursing note and vitals reviewed.   ED Course  Procedures (including critical care time)  DIAGNOSTIC STUDIES: Oxygen Saturation is 97% on RA, adequate by my interpretation.    COORDINATION OF CARE: 12:32 AM Discussed treatment plan with pt at bedside and pt agreed to plan.   Labs Review Labs Reviewed  CBC WITH DIFFERENTIAL/PLATELET - Abnormal;  Notable for the following:    HCT 38.9 (*)    MCHC 36.8 (*)    All other components within normal limits  COMPREHENSIVE METABOLIC PANEL - Abnormal; Notable for the following:    Sodium 126 (*)    Chloride 88 (*)    CO2 21 (*)    Glucose, Bld 105 (*)    BUN <5 (*)    Total Protein 8.3 (*)    AST 133 (*)    Anion gap 17 (*)    All other components within normal limits  ETHANOL - Abnormal; Notable for the following:    Alcohol, Ethyl (B) 292 (*)    All other components within normal limits  URINE RAPID DRUG SCREEN, HOSP PERFORMED - Abnormal; Notable for the following:    Opiates POSITIVE (*)    All other components within normal limits  I-STAT CHEM 8, ED - Abnormal; Notable for the following:    Sodium 132 (*)    Chloride 93 (*)    BUN <3 (*)    Calcium, Ion 0.99 (*)    All other components within normal limits  TROPONIN I  URINALYSIS, ROUTINE W REFLEX MICROSCOPIC (NOT AT [facility])  CBG MONITORING, ED    Imaging Review Ct Head Wo Contrast  05/21/2015   CLINICAL DATA:  Two unwitnessed seizures today with complete loss of consciousness. Golden Circle and Investment banker, operational on last seizure. History of seizures, out of medication for 2 months. History of alcohol withdrawal seizures.  EXAM: CT HEAD WITHOUT CONTRAST  TECHNIQUE: Contiguous axial images were obtained from the base of the skull through the vertex without intravenous contrast.  COMPARISON:  CT head October 11, 2012  FINDINGS: The ventricles and sulci are mildly prominent for age. Disproportionate mild cerebellar volume loss for age. No intraparenchymal hemorrhage, mass effect nor midline shift. No acute large vascular territory infarcts.  No abnormal extra-axial fluid collections. Basal cisterns are patent.  No skull fracture. The included ocular globes and orbital contents are non-suspicious. The mastoid aircells and included paranasal sinuses are well-aerated.  IMPRESSION: No acute intracranial process.  Mild brain atrophy for age.    Electronically Signed   By: Elon Alas M.D.   On: 05/21/2015 02:03   I have personally reviewed and evaluated these images and lab results as part of my medical decision-making.   EKG Interpretation   Date/Time:  Monday May 21 2015 00:39:26 EDT Ventricular Rate:  112 PR Interval:  170 QRS Duration: 86 QT Interval:  314 QTC Calculation: 429 R Axis:   53 Text Interpretation:  Sinus tachycardia Probable left atrial enlargement  Baseline wander in lead(s) V3 Confirmed by HORTON  MD, Kempner ([id number]) on  05/21/2015 1:36:46 AM      MDM   Final diagnoses:  Alcoholic ketoacidosis  Seizure  Hyponatremia    Patient presents with reported seizure activity at home. Reports that he was previously on medication. Per chart review, has a history of alcohol withdrawal seizures. Denies any significant alcohol use today; however, suspect this may be understated. Patient initially  noted to be tachycardic. Otherwise nontoxic and nonfocal. Initial lab work notable for a blood alcohol level of 292. Patient is hyponatremic and with evidence of an anion gap on his CMP.  Suspect this is alcohol related. Patient was given 2 L of fluid.  He was also given thiamine and 1 mg of Ativan to prevent seizures.  5:28 AM Repeat CMP with improved sodium; however, patient has persistent gap. He also remains mildly tachycardic. Patient was started on a D5 half-normal saline infusion. Will discuss with the hospitalist for admission for hydration and optimization. Patient placed on CIWA protocol.  I personally performed the services described in this documentation, which was scribed in my presence. The recorded information has been reviewed and is accurate.   Merryl Hacker, MD 05/21/15 [id number]

## 2015-05-21 NOTE — Progress Notes (Signed)
Patient trasfered from ED to 5W21 via bed; alert and oriented x 4; IV in RAC running fluids at 100cc/hr; skin intact. Orient patient to room and unit; watch safety video; gave patient care guide; instructed how to use the call bell and  fall risk precautions. Will continue to monitor the patient.

## 2015-05-21 NOTE — Progress Notes (Addendum)
   Follow Up Note  Pt admitted earlier this morning.  Seen after arrived to floor.  39 year old male with past history of alcohol abuse and seizure disorder, in part from alcohol withdrawal admitted after having several seizures. EEG unrevealing. Patient initially with high alcohol level. Seen after arrival to floor. Minimizes how much she drank the day prior. Admits to not being compliant with his Depakote  Exam: CV: Regular rate and rhythm, S1-S2 Lungs: Clear to auscultation bilaterally Abd: Soft, nontender, nondistended, positive bowel sounds Ext: No clubbing or cyanosis or edema Neuro: Nonfocal  Present on Admission:  . Alcoholic ketoacidosis: Hydrate plus by mouth bicarbonate   seizure: Doubt that this is from acute alcohol withdrawal as patient was drinking yesterday prior. He does have a history of that though. This may be more from alcohol causing decreased seizure threshold. He is also supposed to be on Depakote, but has not been. He says he has not taken his medication in months. Called his pharmacy do not have any record of this medication. I have started Depakote at 750 by mouth by mouth twice a day with plans to increase by 250 mg by mouth twice a day weekly to target dose of 3000 mg twice a day  . Alcohol abuse with acute intoxication: Resolved. On Ativan scheduled plus tapering dose, plus replacement vitamin supplementation  . Hyponatremia . Concussion Given resolved alcohol level, still tachycardic and mild alcoholic ketoacidosis, anticipate discharge tomorrow

## 2015-05-21 NOTE — ED Notes (Signed)
[facility] responded to the pts home after reports of him having 2 seizures.  He reports that he has not had his medication for 2 months and was seen yesterday at [facility] for the same complaint.  Pt reports that he fell during the seizure, hit his head on the dresser and lost consciousness.  This was not a witnessed event.  He now reports pain in his head/neck, back and stomach.  He reports 1/2 beer earlier today.

## 2015-05-21 NOTE — H&P (Signed)
[facility] Admission History and Physical       Joseph Haney PPI:[id number] DOB: 09-13-1975 DOA: 05/21/2015  Referring physician: EDP PCP: No primary care provider on file.  Specialists:   Chief Complaint: Seizures  HPI: Joseph Haney is a 39 y.o. male with a history of Alcohol Related Seizures in the past who was brought to the ED after 2 witnesses episodes of seizure like activity.  During the second seizure he collapsed and hit his head on the dresser.   He had LOC.   He was evaluated in the ED and was found to have an alcohol Level of 292, and had Sinus tachycardia.   A CT scan of the Head was performred and was negative for acute findings.  He was administered 2 liters of IVFs for fluid resuscitation and was referred for admission.      Review of Systems:  Constitutional: No Weight Loss, No Weight Gain, Night Sweats, Fevers, Chills, Dizziness, Light Headedness, Fatigue, or Generalized Weakness HEENT: No Headaches, Difficulty Swallowing,Tooth/Dental Problems,Sore Throat,  No Sneezing, Rhinitis, Ear Ache, Nasal Congestion, or Post Nasal Drip,  Cardio-vascular:  No Chest pain, Orthopnea, PND, Edema in Lower Extremities, Anasarca, Dizziness, Palpitations  Resp: No Dyspnea, No DOE, No Productive Cough, No Non-Productive Cough, No Hemoptysis, No Wheezing.    GI: No Heartburn, Indigestion, Abdominal Pain, Nausea, Vomiting, Diarrhea, Constipation, Hematemesis, Hematochezia, Melena, Change in Bowel Habits,  Loss of Appetite  GU: No Dysuria, No Change in Color of Urine, No Urgency or Urinary Frequency, No Flank pain.  Musculoskeletal: No Joint Pain or Swelling, No Decreased Range of Motion, No Back Pain.  Neurologic: No Syncope, +Seizures, Muscle Weakness, Paresthesia, Vision Disturbance or Loss, No Diplopia, No Vertigo, No Difficulty Walking,  Skin: No Rash or Lesions. Psych: No Change in Mood or Affect, No Depression or Anxiety, No Memory loss, No Confusion, or  Hallucinations  Past Medical History  Diagnosis Date  . Seizure      History reviewed. No pertinent past surgical history.    Prior to Admission medications   Not on File     Allergies  Allergen Reactions  . Morphine And Related Itching    Social History:  reports that he has never smoked. He does not have any smokeless tobacco history on file. He reports that he drinks alcohol. He reports that he uses illicit drugs (Marijuana) about once per week.    Family History  Problem Relation Age of Onset  . Coronary artery disease Mother   . Coronary artery disease Father   . Heart attack Father 73       Physical Exam:  GEN:  Pleasant Well Nourished and Well Developed  39 y.o. African American male examined and in no acute distress; cooperative with exam Filed Vitals:   05/21/15 0530 05/21/15 0545 05/21/15 0549 05/21/15 0610  BP: 118/73 135/84 135/84 114/81  Pulse: 103 108 107 118  Temp:      TempSrc:      Resp: 22 21  17   SpO2: 94% 97%  98%   Blood pressure 114/81, pulse 118, temperature 98 F (36.7 C), temperature source Oral, resp. rate 17, SpO2 98 %. PSYCH: He is alert and oriented x4; does not appear anxious does not appear depressed; affect is normal HEENT: Normocephalic and Atraumatic, Mucous membranes pink; PERRLA; EOM intact; Fundi:  Benign;  No scleral icterus, Nares: Patent, Oropharynx: Clear, Fair Dentition,    Neck:  FROM, No Cervical Lymphadenopathy nor Thyromegaly or Carotid Bruit; No JVD; Breasts:: Not  examined CHEST WALL: No tenderness CHEST: Normal respiration, clear to auscultation bilaterally HEART: Tachycardic Regular rate; no murmurs rubs or gallops BACK: No kyphosis or scoliosis; No CVA tenderness ABDOMEN: Positive Bowel Sounds, Soft Non-Tender, No Rebound or Guarding; No Masses, No Organomegaly.    Rectal Exam: Not done EXTREMITIES: No Cyanosis, Clubbing, or Edema; No Ulcerations. Genitalia: not examined PULSES: 2+ and symmetric SKIN: Normal  hydration no rash or ulceration CNS:  Alert and Oriented x 4, No Focal Deficits Vascular: pulses palpable throughout    Labs on Admission:  Basic Metabolic Panel:  Recent Labs Lab 05/21/15 0025 05/21/15 0509  NA 126* 132*  K 3.8 3.5  CL 88* 93*  CO2 21*  --   GLUCOSE 105* 72  BUN <5* <3*  CREATININE 0.76 0.80  CALCIUM 9.0  --    Liver Function Tests:  Recent Labs Lab 05/21/15 0025  AST 133*  ALT 50  ALKPHOS 88  BILITOT 1.0  PROT 8.3*  ALBUMIN 4.2   No results for input(s): LIPASE, AMYLASE in the last 168 hours. No results for input(s): AMMONIA in the last 168 hours. CBC:  Recent Labs Lab 05/21/15 0025 05/21/15 0509  WBC 6.3  --   NEUTROABS 4.4  --   HGB 14.3 13.6  HCT 38.9* 40.0  MCV 92.0  --   PLT 187  --    Cardiac Enzymes:  Recent Labs Lab 05/21/15 0042  TROPONINI <0.03    BNP (last 3 results) No results for input(s): BNP in the last 8760 hours.  ProBNP (last 3 results) No results for input(s): PROBNP in the last 8760 hours.  CBG:  Recent Labs Lab 05/21/15 0045  GLUCAP 90    Radiological Exams on Admission: Ct Head Wo Contrast  05/21/2015   CLINICAL DATA:  Two unwitnessed seizures today with complete loss of consciousness. Golden Circle and Investment banker, operational on last seizure. History of seizures, out of medication for 2 months. History of alcohol withdrawal seizures.  EXAM: CT HEAD WITHOUT CONTRAST  TECHNIQUE: Contiguous axial images were obtained from the base of the skull through the vertex without intravenous contrast.  COMPARISON:  CT head October 11, 2012  FINDINGS: The ventricles and sulci are mildly prominent for age. Disproportionate mild cerebellar volume loss for age. No intraparenchymal hemorrhage, mass effect nor midline shift. No acute large vascular territory infarcts.  No abnormal extra-axial fluid collections. Basal cisterns are patent.  No skull fracture. The included ocular globes and orbital contents are non-suspicious. The mastoid  aircells and included paranasal sinuses are well-aerated.  IMPRESSION: No acute intracranial process.  Mild brain atrophy for age.   Electronically Signed   By: Elon Alas M.D.   On: 05/21/2015 02:03     EKG: Independently reviewed. Sinus Tachycardia rate =112         Assessment/Plan:   39 y.o. male with  Active Problems:   1.   Alcohol withdrawal seizure   PRN IV Ativan   CIWA Protocol    2.    Alcoholic ketoacidosis   IVFs       3.    Alcohol Abuse   CIWA Protocol   Consult Social work     4.    Hyponatremia   IVFs with NSS   Monitor Na+levels     5.    Concussion   Neuro Checks      6.     DVT Prophylaxis   Lovenox    Code Status:     FULL CODE  Family Communication:    No Family Present    Disposition Plan:    Inpatient  Status        Time spent:  [facility] Pager [phone]   If [facility] Please Contact the Day Rounding Team MD for [facility]  If 7PM-7AM, Please Contact Night-Floor Coverage  [email or web address] Password [facility] 05/21/2015, 6:37 AM     ADDENDUM:   Patient was seen and examined on 05/21/2015

## 2015-05-22 DIAGNOSIS — G40909 Epilepsy, unspecified, not intractable, without status epilepticus: Secondary | ICD-10-CM

## 2015-05-22 LAB — BASIC METABOLIC PANEL
Anion gap: 9 (ref 5–15)
CHLORIDE: 100 mmol/L — AB (ref 101–111)
CO2: 25 mmol/L (ref 22–32)
CREATININE: 0.77 mg/dL (ref 0.61–1.24)
Calcium: 8.6 mg/dL — ABNORMAL LOW (ref 8.9–10.3)
GFR calc Af Amer: >60 mL/min (ref >60)
GFR calc non Af Amer: >60 mL/min (ref >60)
Glucose, Bld: 97 mg/dL (ref 65–99)
Potassium: 3.4 mmol/L — ABNORMAL LOW (ref 3.5–5.1)
Sodium: 134 mmol/L — ABNORMAL LOW (ref 135–145)

## 2015-05-22 LAB — CBC
HEMATOCRIT: 34.2 % — AB (ref 39.0–52.0)
Hemoglobin: 12.4 g/dL — ABNORMAL LOW (ref 13.0–17.0)
MCH: 34 pg (ref 26.0–34.0)
MCHC: 36.3 g/dL — ABNORMAL HIGH (ref 30.0–36.0)
MCV: 93.7 fL (ref 78.0–100.0)
PLATELETS: 160 10*3/uL (ref 150–400)
RBC: 3.65 MIL/uL — AB (ref 4.22–5.81)
RDW: 12.3 % (ref 11.5–15.5)
WBC: 4.6 10*3/uL (ref 4.0–10.5)

## 2015-05-22 MED ORDER — OXYCODONE HCL 5 MG PO TABS
5.0000 mg | ORAL_TABLET | Freq: Once | ORAL | Status: AC
  Administered 2015-05-22: 5 mg via ORAL
  Filled 2015-05-22: qty 1

## 2015-05-22 MED ORDER — PHENYTOIN SODIUM EXTENDED 100 MG PO CAPS: 100.0000 mg | ORAL_CAPSULE | Freq: Three times a day (TID) | ORAL | Status: DC

## 2015-05-22 MED ORDER — CARVEDILOL 6.25 MG PO TABS: 12.5000 mg | ORAL_TABLET | Freq: Two times a day (BID) | ORAL | Status: DC

## 2015-05-22 NOTE — Care Management Note (Addendum)
Case Management Note  Patient Details  Name: Finnley Larusso MRN: [id number] Date of Birth: [birth year]  Subjective/Objective:                  Date:9-20 tuesdaqy Spoke with patient at the bedside. Introduced self as Tourist information centre manager and explained role in discharge planning and how to be reached. Verified patient lives in [place] in a house,  with fiance and two kids, has DME. Expressed potential need for no other DME. Verified patient anticipates to go home with family,  at time of discharge and will havepart-time supervision by family s at this time to best of their knowledge. Patient  denied needing help with their medication. Patient  is driven by fiance to MD appointments. Verified patient has no PCP, agress to follow up [facility]. Patient received pamphlet from [facility]. CM explained to patient that they may use the on site pharmacy to fill prescriptions given to them at discharge. Patient aware that the [facility] pharmacy will not fill narcotics or pain medications prior to the patient being seen by one of their physicians.  Patient aware that they must be seen as a patient prior to the pharmacy filling the prescriptions a second time.   Plan: No further needs.   Carles Collet RN BSN CM [phone]   Action/Plan:   Expected Discharge Date:                  Expected Discharge Plan:  Home/Self Care  In-House Referral:     Discharge planning Services  CM Consult, [facility]  Post Acute Care Choice:    Choice offered to:     DME Arranged:    DME Agency:     HH Arranged:    [place] Agency:     Status of Service:  Completed, signed off  Medicare Important Message Given:    Date Medicare IM Given:    Medicare IM give by:    Date Additional Medicare IM Given:    Additional Medicare Important Message give by:     If discussed at [place] of Stay Meetings, dates discussed:    Additional Comments:  Carles Collet,  RN 05/22/2015, 11:03 AM

## 2015-05-22 NOTE — Discharge Summary (Signed)
Discharge Summary  Savan Ruta CNO:[id number] DOB: 09/23/1975  PCP: No primary care provider on file.  Admit date: 05/21/2015 Discharge date: 05/22/2015  Time spent: 25 minutes  Recommendations for Outpatient Follow-up:  1. New medication: Dilantin 100 mg by mouth 3 times a day 2. New medication: Coreg 6.25 mg by mouth twice a day 3.  Patient being given [facility]  Discharge Diagnoses:  Active Hospital Problems   Diagnosis Date Noted  . Alcoholic ketoacidosis 29/47/6546  . Hyponatremia 05/21/2015  . Concussion 05/21/2015  . Alcohol withdrawal seizure 07/25/2012  . Alcohol abuse 07/25/2012    Resolved Hospital Problems   Diagnosis Date Noted Date Resolved  No resolved problems to display.    Discharge Condition: Improved, being discharged home  Diet recommendation: Low-sodium  Filed Weights   05/21/15 1041  Weight: 64.411 kg (142 lb)    History of present illness:  39 year old male with past history of alcohol abuse and seizure disorder, in part from alcohol withdrawal admitted after having several seizures. EEG unrevealing. Patient initially with high alcohol level.   Hospital Course:  Active Problems:   Alcohol abuse with acute intoxication: Patient treated with IV fluids, as well as CIWA protocol plus tapering dose plus vitamin supplementation. I discussed with him the need to cut back and he is quite evasive.  I think by giving him a prescription for benzodiazepine, this will not dissuade him from drinking. He shows no admittance that he has an alcohol problem   Seizure disorder: Patient is very vague on his past medical history. He says at one point he was on Depakote. He says he has not been on medications for some time. Given that he came in with elevated alcohol level and still had a seizure, do not think that this is an alcohol withdrawal seizure. Have started him on Dilantin and given him a prescription. Close follow-up  will be needed. Patient getting a referral to the [facility] as he does not have a primary care physician.    Alcoholic ketoacidosis: An on gap elevated on admission. With IV fluids and bicarbonate, normalized by next day   Hyponatremia: Secondary dehydration, resolved with IV fluids Hypertension: Patient markedly tachycardic, likely in the setting of alcohol abuse as well as likely has underlying elevated high blood pressure. Have started him on low-dose Coreg   Procedures:  EEG done 9/19: Unrevealing  Consultations:  Neurology for EEG interpretation  Discharge Exam: BP 109/75 mmHg  Pulse 93  Temp(Src) 97.8 F (36.6 C) (Oral)  Resp 16  Ht 5\' 6"  (1.676 m)  Wt 64.411 kg (142 lb)  BMI 22.93 kg/m2  SpO2 100%  General: Alert and oriented 3, no acute distress Cardiovascular: Regular rate and rhythm, S1-S2 Respiratory: Clear to auscultation bilaterally  Discharge Instructions You were cared for by a hospitalist during your hospital stay. If you have any questions about your discharge medications or the care you received while you were in the hospital after you are discharged, you can call the unit and asked to speak with the hospitalist on call if the hospitalist that took care of you is not available. Once you are discharged, your primary care physician will handle any further medical issues. Please note that NO REFILLS for any discharge medications will be authorized once you are discharged, as it is imperative that you return to your primary care physician (or establish a relationship with a primary care physician if you do not have one) for your aftercare needs  so that they can reassess your need for medications and monitor your lab values.  Discharge Instructions    Diet - low sodium heart healthy    Complete by:  As directed      Increase activity slowly    Complete by:  As directed             Medication List    TAKE these medications        carvedilol  6.25 MG tablet  Commonly known as:  COREG  Take 2 tablets (12.5 mg total) by mouth 2 (two) times daily with a meal.     phenytoin 100 MG ER capsule  Commonly known as:  DILANTIN  Take 1 capsule (100 mg total) by mouth 3 (three) times daily.       Allergies  Allergen Reactions  . Morphine And Related Itching      The results of significant diagnostics from this hospitalization (including imaging, microbiology, ancillary and laboratory) are listed below for reference.    Significant Diagnostic Studies: Ct Head Wo Contrast  05/21/2015   CLINICAL DATA:  Two unwitnessed seizures today with complete loss of consciousness. [employer] and [employer], operational on last seizure. History of seizures, out of medication for 2 months. History of alcohol withdrawal seizures.  EXAM: CT HEAD WITHOUT CONTRAST  TECHNIQUE: Contiguous axial images were obtained from the base of the skull through the vertex without intravenous contrast.  COMPARISON:  CT head October 11, 2012  FINDINGS: The ventricles and sulci are mildly prominent for age. Disproportionate mild cerebellar volume loss for age. No intraparenchymal hemorrhage, mass effect nor midline shift. No acute large vascular territory infarcts.  No abnormal extra-axial fluid collections. Basal cisterns are patent.  No skull fracture. The included ocular globes and orbital contents are non-suspicious. The mastoid aircells and included paranasal sinuses are well-aerated.  IMPRESSION: No acute intracranial process.  Mild brain atrophy for age.   Electronically Signed   By: Elon Alas M.D.   On: 05/21/2015 02:03    Microbiology: No results found for this or any previous visit (from the past 240 hour(s)).   Labs: Basic Metabolic Panel:  Recent Labs Lab 05/21/15 0025 05/21/15 0509 05/21/15 1231 05/22/15 0600  NA 126* 132* 132* 134*  K 3.8 3.5 3.6 3.4*  CL 88* 93* 96* 100*  CO2 21*  --  19* 25  GLUCOSE 105* 72 86 97  BUN <5* <3* <5* <5*  CREATININE 0.76 0.80  0.69 0.77  CALCIUM 9.0  --  8.4* 8.6*   Liver Function Tests:  Recent Labs Lab 05/21/15 0025  AST 133*  ALT 50  ALKPHOS 88  BILITOT 1.0  PROT 8.3*  ALBUMIN 4.2   No results for input(s): LIPASE, AMYLASE in the last 168 hours. No results for input(s): AMMONIA in the last 168 hours. CBC:  Recent Labs Lab 05/21/15 0025 05/21/15 0509 05/22/15 0600  WBC 6.3  --  4.6  NEUTROABS 4.4  --   --   HGB 14.3 13.6 12.4*  HCT 38.9* 40.0 34.2*  MCV 92.0  --  93.7  PLT 187  --  160   Cardiac Enzymes:  Recent Labs Lab 05/21/15 0042  TROPONINI <0.03   BNP: BNP (last 3 results) No results for input(s): BNP in the last 8760 hours.  ProBNP (last 3 results) No results for input(s): PROBNP in the last 8760 hours.  CBG:  Recent Labs Lab 05/21/15 0045  GLUCAP 90       Signed:  KRISHNAN,SENDIL K  [facility] 05/22/2015, 10:41 AM

## 2015-05-22 NOTE — Progress Notes (Signed)
Patient was discharged home by MD order; discharged instructions  review and give to patient with care notes; IV DIC; skin intact; patient will be escorted to the car by nurse tech via wheelchair.  

## 2015-05-31 ENCOUNTER — Inpatient Hospital Stay: Payer: Medicaid Other | Admitting: Family Medicine

## 2015-07-03 ENCOUNTER — Encounter (HOSPITAL_COMMUNITY): Payer: Self-pay | Admitting: *Deleted

## 2015-07-03 ENCOUNTER — Emergency Department (HOSPITAL_COMMUNITY)
Admission: EM | Admit: 2015-07-03 | Discharge: 2015-07-03 | Disposition: A | Discharge status: Home / Self Care | Payer: Medicaid Other | Attending: Emergency Medicine | Admitting: Emergency Medicine

## 2015-07-03 DIAGNOSIS — Y9389 Activity, other specified: Secondary | ICD-10-CM | POA: Diagnosis not present

## 2015-07-03 DIAGNOSIS — G40909 Epilepsy, unspecified, not intractable, without status epilepticus: Secondary | ICD-10-CM | POA: Insufficient documentation

## 2015-07-03 DIAGNOSIS — Z79899 Other long term (current) drug therapy: Secondary | ICD-10-CM | POA: Insufficient documentation

## 2015-07-03 DIAGNOSIS — S01512A Laceration without foreign body of oral cavity, initial encounter: Secondary | ICD-10-CM | POA: Diagnosis not present

## 2015-07-03 DIAGNOSIS — X58XXXA Exposure to other specified factors, initial encounter: Secondary | ICD-10-CM | POA: Insufficient documentation

## 2015-07-03 DIAGNOSIS — Y9289 Other specified places as the place of occurrence of the external cause: Secondary | ICD-10-CM | POA: Diagnosis not present

## 2015-07-03 DIAGNOSIS — Y998 Other external cause status: Secondary | ICD-10-CM | POA: Insufficient documentation

## 2015-07-03 DIAGNOSIS — Z8719 Personal history of other diseases of the digestive system: Secondary | ICD-10-CM | POA: Diagnosis not present

## 2015-07-03 DIAGNOSIS — R569 Unspecified convulsions: Secondary | ICD-10-CM

## 2015-07-03 LAB — BASIC METABOLIC PANEL
Anion gap: 33 — ABNORMAL HIGH (ref 5–15)
BUN: <5 mg/dL — ABNORMAL LOW (ref 6–20)
CHLORIDE: 97 mmol/L — AB (ref 101–111)
CO2: 7 mmol/L — AB (ref 22–32)
CREATININE: 0.79 mg/dL (ref 0.61–1.24)
Calcium: 9.5 mg/dL (ref 8.9–10.3)
GFR calc non Af Amer: >60 mL/min (ref >60)
GLUCOSE: 109 mg/dL — AB (ref 65–99)
Potassium: 3.4 mmol/L — ABNORMAL LOW (ref 3.5–5.1)
Sodium: 137 mmol/L (ref 135–145)

## 2015-07-03 LAB — CBC WITH DIFFERENTIAL/PLATELET
BASOS PCT: 1 %
Basophils Absolute: 0.1 10*3/uL (ref 0.0–0.1)
Eosinophils Absolute: 0 10*3/uL (ref 0.0–0.7)
Eosinophils Relative: 0 %
HEMATOCRIT: 37.5 % — AB (ref 39.0–52.0)
HEMOGLOBIN: 13.1 g/dL (ref 13.0–17.0)
LYMPHS ABS: 1.4 10*3/uL (ref 0.7–4.0)
LYMPHS PCT: 20 %
MCH: 34.4 pg — ABNORMAL HIGH (ref 26.0–34.0)
MCHC: 34.9 g/dL (ref 30.0–36.0)
MCV: 98.4 fL (ref 78.0–100.0)
MONOS PCT: 11 %
Monocytes Absolute: 0.7 10*3/uL (ref 0.1–1.0)
NEUTROS ABS: 4.7 10*3/uL (ref 1.7–7.7)
NEUTROS PCT: 68 %
Platelets: 171 10*3/uL (ref 150–400)
RBC: 3.81 MIL/uL — ABNORMAL LOW (ref 4.22–5.81)
RDW: 13 % (ref 11.5–15.5)
WBC: 6.9 10*3/uL (ref 4.0–10.5)

## 2015-07-03 LAB — ETHANOL: Alcohol, Ethyl (B): 71 mg/dL — ABNORMAL HIGH (ref <5)

## 2015-07-03 MED ORDER — SODIUM CHLORIDE 0.9 % IV BOLUS (SEPSIS)
1000.0000 mL | Freq: Once | INTRAVENOUS | Status: AC
  Administered 2015-07-03: 1000 mL via INTRAVENOUS

## 2015-07-03 MED ORDER — PHENYTOIN SODIUM 50 MG/ML IJ SOLN
1000.0000 mg | Freq: Once | INTRAMUSCULAR | Status: AC
  Administered 2015-07-03: 1000 mg via INTRAVENOUS
  Filled 2015-07-03: qty 20

## 2015-07-03 MED ORDER — CHLORDIAZEPOXIDE HCL 25 MG PO CAPS: ORAL_CAPSULE | ORAL | Status: DC

## 2015-07-03 MED ORDER — PHENYTOIN SODIUM EXTENDED 100 MG PO CAPS: 100.0000 mg | ORAL_CAPSULE | Freq: Three times a day (TID) | ORAL | Status: DC

## 2015-07-03 MED ORDER — LORAZEPAM 2 MG/ML IJ SOLN
2.0000 mg | Freq: Once | INTRAMUSCULAR | Status: AC
  Administered 2015-07-03: 2 mg via INTRAMUSCULAR
  Filled 2015-07-03: qty 1

## 2015-07-03 MED ORDER — LORAZEPAM 2 MG/ML IJ SOLN
1.0000 mg | Freq: Once | INTRAMUSCULAR | Status: AC
  Administered 2015-07-03: 1 mg via INTRAVENOUS
  Filled 2015-07-03: qty 1

## 2015-07-03 NOTE — Discharge Instructions (Signed)
Please be sure to take all medication as directed, and minimize the use of illicit substances.  Return here for concerning changes in your condition.

## 2015-07-03 NOTE — ED Provider Notes (Signed)
CSN: [id number]     Arrival date & time 07/03/15  1512 History   First MD Initiated Contact with Patient 07/03/15 1533     Chief Complaint  Patient presents with  . Seizures    HPI  Patient presents with concern of discomfort. Prior to my evaluation the patient had a witnessed seizure in the triage area. Per report the patient stated that he drank alcohol, used Molly, and smoked marijuana overnight. He also reported not sleeping overnight. He requested medication, soon thereafter had seizure. Per report the patient has a history of seizure acknowledged noncompliance with Dilantin dosing. Level V caveat secondary to seizure/postictal phase.  Past Medical History  Diagnosis Date  . Seizure ([place])   . Alcohol abuse   . GERD (gastroesophageal reflux disease)    Past Surgical History  Procedure Laterality Date  . No past surgeries     Family History  Problem Relation Age of Onset  . Coronary artery disease Mother   . Coronary artery disease Father   . Heart attack Father 77   Social History  Substance Use Topics  . Smoking status: Never Smoker   . Smokeless tobacco: Never Used  . Alcohol Use: 0.0 oz/week     Comment: one fifth liquor per week; 80 oz beer/day  05/21/2015 " I drink a beer like once a week"    Review of Systems  Unable to perform ROS: Other   seizing   Allergies  Morphine and related  Home Medications   Prior to Admission medications   Medication Sig Start Date End Date Taking? Authorizing Provider  carvedilol (COREG) 6.25 MG tablet Take 2 tablets (12.5 mg total) by mouth 2 (two) times daily with a meal. 05/22/15   Annita Brod, MD  phenytoin (DILANTIN) 100 MG ER capsule Take 1 capsule (100 mg total) by mouth 3 (three) times daily. 05/22/15   Annita Brod, MD   BP 170/104 mmHg  Pulse 124  Temp(Src) 97.5 F (36.4 C) (Oral)  Resp 32  SpO2 99% Physical Exam  Constitutional: He appears well-developed. No distress.  HENT:  Head:  Normocephalic and atraumatic.    Eyes: Conjunctivae and EOM are normal.  Cardiovascular: Normal rate and regular rhythm.   Pulmonary/Chest: Effort normal. No stridor. No respiratory distress.  Abdominal: He exhibits no distension.  Musculoskeletal: He exhibits no edema.  Neurological: He displays seizure activity.  Moving all extremities spontaneously, minimally, not following commands.  Skin: Skin is warm and dry.  Psychiatric: Cognition and memory are impaired.  Nursing note and vitals reviewed.   ED Course  Procedures (including critical care time) Labs Review Labs Reviewed  ETHANOL - Abnormal; Notable for the following:    Alcohol, Ethyl (B) 71 (*)    All other components within normal limits  CBC WITH DIFFERENTIAL/PLATELET - Abnormal; Notable for the following:    RBC 3.81 (*)    HCT 37.5 (*)    MCH 34.4 (*)    All other components within normal limits  BASIC METABOLIC PANEL - Abnormal; Notable for the following:    Potassium 3.4 (*)    Chloride 97 (*)    CO2 7 (*)    Glucose, Bld 109 (*)    BUN <5 (*)    Anion gap 33 (*)    All other components within normal limits    I have personally reviewed and evaluated these lab results as part of my medical decision-making.   EKG Interpretation   Date/Time:  Tuesday July 03 2015 15:26:34 EDT Ventricular Rate:  121 PR Interval:  152 QRS Duration: 80 QT Interval:  347 QTC Calculation: 492 R Axis:   66 Text Interpretation:  Sinus tachycardia Borderline prolonged QT interval  Baseline wander in lead(s) II aVF Sinus tachycardia Artifact Abnormal ekg  Confirmed by Carmin Muskrat  MD [phone]) on 07/03/2015 3:32:57 PM     Chart review notable for history of seizures, Dilantin use, alcohol abuse.   4:44 PM Patient awakening.  He acknowledges using all of the aforementioned substances within the past 24 hours.  O2- 99%ra, nml  IVF, Dilantin, ativan pending.  7:07 PM Patient not completely awake, oriented  properly. He remains tachycardic, but with no ongoing complaints.  He has received 2 L fluid resuscitation.  We discussed all findings, and the need for avoiding illicit substances, taking all medication as directed.   MDM  Patient presents initially with concern of being uncomfortable, but soon after arrival has witnessed seizure. Here, the patient is eventually awake, and though he is mildly tachycardic, he is in no distress, with recovery from his seizure episode. Tachycardia secondary to use of illicit substances overnight, including alcohol, Molly, marijuana.  Acute withdrawal considered, but with etoh >70, and no Hx of withdrawal seizures, this seems less likely. With his improvement, he was d/c w meds, neuro f/u.    Carmin Muskrat, MD 07/03/15 [phone]

## 2015-07-03 NOTE — ED Notes (Signed)
Hx of seizures and daily ETOH. Pt reported last night around 1900 he drank ETOH, took [place], and smoked marijuana. Pt stayed up all night.  Pt came in with lip shaking. Kept saying he needed meds. rn asked about seizure meds and pt said he had not taken meds, but unsure last time pt took meds.  rn got an EKG due to tachycardia. rn went back into triage room to ask pt when the last time he took his seizure meds. Pt made loud grunting nose, throwing his hands straight forward in front of him, froze in that position for a few seconds, and then started have all over body convulsions, pt started drooling from mouth, rn turned pt to his side, pt started drooling some blood out of his mouth. Pt after a few more seconds became limp and started sliding down triage chair. rn lowered pt to the ground. PA ordered ativan IM, PA stayed with pt while rn went and got medications. PA stated pt had a few more convulsions while rn was out of room.

## 2015-10-09 ENCOUNTER — Emergency Department (HOSPITAL_COMMUNITY): Payer: Medicaid Other

## 2015-10-09 ENCOUNTER — Emergency Department (HOSPITAL_COMMUNITY)
Admission: EM | Admit: 2015-10-09 | Discharge: 2015-10-09 | Disposition: A | Discharge status: Home / Self Care | Payer: Medicaid Other | Attending: Emergency Medicine | Admitting: Emergency Medicine

## 2015-10-09 ENCOUNTER — Encounter (HOSPITAL_COMMUNITY): Payer: Self-pay

## 2015-10-09 DIAGNOSIS — Y998 Other external cause status: Secondary | ICD-10-CM | POA: Insufficient documentation

## 2015-10-09 DIAGNOSIS — R51 Headache: Secondary | ICD-10-CM

## 2015-10-09 DIAGNOSIS — M62830 Muscle spasm of back: Secondary | ICD-10-CM | POA: Insufficient documentation

## 2015-10-09 DIAGNOSIS — Y9289 Other specified places as the place of occurrence of the external cause: Secondary | ICD-10-CM | POA: Diagnosis not present

## 2015-10-09 DIAGNOSIS — S4992XA Unspecified injury of left shoulder and upper arm, initial encounter: Secondary | ICD-10-CM | POA: Insufficient documentation

## 2015-10-09 DIAGNOSIS — Z79899 Other long term (current) drug therapy: Secondary | ICD-10-CM | POA: Diagnosis not present

## 2015-10-09 DIAGNOSIS — S199XXA Unspecified injury of neck, initial encounter: Secondary | ICD-10-CM | POA: Insufficient documentation

## 2015-10-09 DIAGNOSIS — S3992XA Unspecified injury of lower back, initial encounter: Secondary | ICD-10-CM | POA: Insufficient documentation

## 2015-10-09 DIAGNOSIS — S0990XA Unspecified injury of head, initial encounter: Secondary | ICD-10-CM | POA: Diagnosis present

## 2015-10-09 DIAGNOSIS — S3991XA Unspecified injury of abdomen, initial encounter: Secondary | ICD-10-CM | POA: Diagnosis not present

## 2015-10-09 DIAGNOSIS — Y9389 Activity, other specified: Secondary | ICD-10-CM | POA: Insufficient documentation

## 2015-10-09 DIAGNOSIS — Z8719 Personal history of other diseases of the digestive system: Secondary | ICD-10-CM | POA: Insufficient documentation

## 2015-10-09 DIAGNOSIS — S59902A Unspecified injury of left elbow, initial encounter: Secondary | ICD-10-CM | POA: Insufficient documentation

## 2015-10-09 DIAGNOSIS — S0011XA Contusion of right eyelid and periocular area, initial encounter: Secondary | ICD-10-CM | POA: Diagnosis not present

## 2015-10-09 DIAGNOSIS — F329 Major depressive disorder, single episode, unspecified: Secondary | ICD-10-CM | POA: Diagnosis not present

## 2015-10-09 DIAGNOSIS — R55 Syncope and collapse: Secondary | ICD-10-CM | POA: Insufficient documentation

## 2015-10-09 DIAGNOSIS — R519 Headache, unspecified: Secondary | ICD-10-CM

## 2015-10-09 MED ORDER — IBUPROFEN 600 MG PO TABS: 600.0000 mg | ORAL_TABLET | Freq: Four times a day (QID) | ORAL | Status: DC | PRN

## 2015-10-09 MED ORDER — CYCLOBENZAPRINE HCL 10 MG PO TABS: 5.0000 mg | ORAL_TABLET | Freq: Two times a day (BID) | ORAL | Status: DC | PRN

## 2015-10-09 MED ORDER — IBUPROFEN 800 MG PO TABS
800.0000 mg | ORAL_TABLET | Freq: Once | ORAL | Status: AC
  Administered 2015-10-09: 800 mg via ORAL
  Filled 2015-10-09: qty 1

## 2015-10-09 MED ORDER — CYCLOBENZAPRINE HCL 10 MG PO TABS
5.0000 mg | ORAL_TABLET | Freq: Once | ORAL | Status: AC
  Administered 2015-10-09: 5 mg via ORAL
  Filled 2015-10-09: qty 1
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
## 2015-10-09 NOTE — Discharge Instructions (Signed)
RESOURCE GUIDE ° °Chronic Pain Problems: °Contact [facility]  [phone] °Patients need to be referred by their primary care doctor. ° °Insufficient Money for Medicine: °Contact [employer]:  call "211" or [facility] [phone]. ° °No Primary Care Doctor: °Call [facility]  [phone] - can help you locate a primary care doctor that  accepts your insurance, provides certain services, etc. °Physician Referral Service- [phone] ° °Agencies that provide inexpensive medical care: °[facility]  [phone] °[facility]  [phone] °[facility]  [phone] °Women's Clinic  [phone] °Planned Parenthood  [phone] °[facility]  [phone] ° °Medicaid-accepting [place] Providers: °[facility]- [address] ° [phone], Mon-Fri 9am-7pm, Sat 9am-1pm °[facility]- [address], [address] ° [phone] °[facility]- [address], [address] ° [phone] °[facility]- [address] [place] [address] ° [phone] °[facility]- [address], [phone] ° Only accepts [place] Access Medicaid patients after they have their name  applied to their card ° °Self Pay (no insurance) in [place]: °Sickle Cell Patients: Dr Eric Dean, [facility] ° [address], [phone] °[facility]- [address] ° [phone] °      -     [facility]- [address] °      -     [facility]- see information above (Speak to Pam H if you do not have insurance) °      -  [facility]- [address], [phone] °      -  [facility] [place]- [address],  [phone] °      -  [facility]- [address] [place] [address], [phone] °      -  Dr Osei-Bonsu-  [address], [place], [phone] °      -  [facility]- [address], [phone] °      -   [facility]- [address] [place] [address], [phone], also [address], [phone] °      -    [facility]- [address], [phone], 1st & 3rd Saturday   every month, 10am-1pm ° °1) Find a Doctor and Pay Out of Pocket °Although you won't have to find out who is covered by your insurance plan, it is a good idea to ask around and get recommendations. You will then need to call the office and see if the doctor you have chosen will accept you as a new patient and what types of options they offer for patients who are self-pay. Some doctors offer discounts or will set up payment plans for their patients who do not have insurance, but you will need to ask so you aren't surprised when you get to your appointment. ° °2) Contact Your Local Health Department °Not all health departments have doctors that can see patients for sick visits, but many do, so it is worth a call to see if yours does. If you don't know where your local health department is, you can check in your phone book. The CDC also has a tool to help you locate your state's health department, and many state websites also have listings of all of their local health departments. ° °3) Find a Walk-in Clinic °If   your illness is not likely to be very severe or complicated, you may want to try a walk in clinic. These are popping up all over the country in pharmacies, drugstores, and shopping centers. They're usually staffed by nurse practitioners or physician assistants that have been trained to treat common illnesses and complaints. They're usually fairly quick and inexpensive. However, if you have serious medical issues or chronic medical problems, these are probably not your best option  [facility] [place], [facility], [address], [place], phone [phone] or [phone].  Monday - Friday, call for an appointment. [place], [facility], [address], [place], phone [phone] or [phone].  Monday - Friday, call for an appointment.  Abuse/Neglect: [place] [phone] [place] [phone] (After Hours)  Emergency Shelter:  [facility] [phone]  Maternity Homes: [facility] at [facility] [place] [phone] [place] [phone]  MRSA Hotline #:   847-732-0121  [facility]. [address]         [place] Phone:  [phone]                                  Phone:  [phone]                   Phone:  [phone]  [facility], [place]- [phone]       -     [facility] in [place], [address]                                  [place] [phone] or [phone] (After Hours)   [place]  Substance Abuse Resources: [facility] and Drug Services  [phone] [place] [phone] [facility] [place] Chinita Pester [phone] Residential & Outpatient Substance Abuse Program  [phone]  Psychological Services: [place]  [phone] [place]  [place], [place]. [address], [place], [place]: [phone] or [phone], [email or web address]  Dental Assistance  If unable to pay or uninsured, contact:  [facility] or [facility]. to become qualified for the adult dental clinic.  Patients with Medicaid: [facility] [phone] [address], [place] [address], [phone]  If unable to pay, or uninsured, contact [facility] [phone]) or [place] [phone] in [place], [place] in [facility]) to become qualified for the adult dental clinic  Other Low-Cost  [employer] Services: [facility]- [address] [place], [place], [address], [place], [address], 2nd and 4th Thursday of the month at 6:30am.  10 clients each day by appointment, can sometimes see walk-in patients if someone does not show for an appointment. [facility]- [address] [place]ard Danker [place], [place], [address], [place], [place], [place], [address], [place] [facility] [phone]  Please make every effort to establish with a primary care physician for routine medical care  [place]  The [place] provides a wide range of adult health services. Some of these services are designed to address the healthcare needs of all [facility] residents and all services are designed to meet the needs of uninsured/underinsured low income residents. Some services are available to any resident of [place], call [phone] for details. ] [facility], a new medical clinic for adults, is now open. For more information about the Center and its services please call [phone]. For information on our [place] services, click here.  For more information on any of the following Department of Public Health programs, including hours of service, click on the highlighted link.  SERVICES FOR WOMEN (Adults and Teens) [employer] provide a full range of birth control options plus education and counseling. New patient visit and annual return visits include a complete  examination, pap test as indicated, and other laboratory as indicated. Included is our [employer] for men.  Maternity Care is provided through pregnancy, including a six week post partum exam. Women who meet eligibility criteria for the Medicaid for Pregnant Women program, receive care free. Other women are charged on a sliding scale according to income. Note: [facility] provides services to pregnant women who have a Medicaid card. Call [phone] for an appointment in [place] or [phone] for an appointment in [facility].  Primary Care for Medicaid [place] Access Women is available through the [place]. As primary care provider for the [place] program, women may designate the [facility] clinic as their primary care provider.  PLEASE CALL [phone] FOR AN APPOINTMENT FOR THE ABOVE SERVICES IN EITHER [place] OR [place]. Information available in [place] and [place].   Childbirth Education Classes are open to the public and offered to help families prepare for the best possible childbirth experience as well as to promote lifelong health and wellness. Classes are offered throughout the year and meet on the same night once a week for five weeks. Medicaid covers the cost of the classes for the mother-to-be and her partner. For participants without Medicaid, the cost of the class series is $45.00 for the mother-to-be and her partner. Class size is limited and registration is required. For more information or to register call [phone]. Baby items donated by [employer] and the [employer] [place] are given away during each class series.  SERVICES FOR WOMEN AND MEN Sexually Transmitted Infection appointments, including HIV testing, are available daily (weekdays, except holidays). Call early as same-day appointments are limited. For an appointment in either [facility] or [place],  call [phone]. Services are confidential and free of charge.  Skin Testing for Tuberculosis Please call [phone]. Adult Immunizations are available, usually for a fee. Please call [phone] for details.  PLEASE CALL [phone] FOR AN APPOINTMENT FOR THE ABOVE  SERVICES IN EITHER [place] OR [place].   [facility] provides up to the minute recommended vaccines for your travel destination. We also provide essential health and political information to help insure a safe and pleasurable travel experience. This program is self-sustaining, however, fees are very competitive. We are a CERTIFIED YELLOW FEVER IMMUNIZATION approved clinic site. PLEASE CALL [phone] FOR AN APPOINTMENT IN EITHER [place] OR [place].   If you have questions about the services listed above, we want to answer them! Email [place] at: [email or web address] Home Visiting Services for elderly and the disabled are available to residents of [facility] who are in need of care that compares to the care offered by a nursing home, have needs that can be met by the program, and have [place] Medicaid. Other short term services are available to residents 18 years and older who are unable to meet requirements for eligibility to receive services from a certified home health agency, spend the majority of time at home, and need care for six months or less.  PLEASE CALL [id number] OR [phone] FOR MORE INFORMATION. Medication Assistance Program serves as a link between pharmaceutical companies and patients to provide low cost or free prescription medications. This servce is available for residents who meet certain income restrictions and have no insurance coverage.  PLEASE CALL [phone] ([place]) OR [phone] ([place]) FOR MORE INFORMATION.  Updated Feb. 21, 2013  Adjustment Disorder Adjustment disorder is an unusually severe reaction to a stressful life event, such as the loss of a job or physical illness. The event  may be any stressful event other than the loss of a loved one. Adjustment disorder may affect your feelings, your thinking, how you act, or a combination of these. It may interfere with personal relationships or with the way you are at work, school, or home. People with this disorder are at risk for suicide and substance abuse. They may develop a more serious mental disorder, such as major depressive disorder or post-traumatic stress disorder. SIGNS AND SYMPTOMS  Symptoms may include:  Sadness, depressed mood, or crying spells.  Loss of enjoyment.  Change in appetite or weight.  Sense of loss or hopelessness.  Thoughts of suicide.  Anxiety, worry, or nervousness.  Trouble sleeping.  Avoiding family and friends.  Poor school performance.  Fighting or vandalism.  Reckless driving.  Skipping school.  Poor work Systems analyst.  Ignoring bills. Symptoms of adjustment disorder start within 3 months of the stressful life event. They do not last more than 6 months after the event has ended. DIAGNOSIS  To make a diagnosis, your health care provider will ask about what has happened in your life and how it has affected you. He or she may also ask about your medical history and use of medicines, alcohol, and other substances. Your health care provider may do a physical exam and order lab tests or other studies. You may be referred to a mental health specialist for evaluation. TREATMENT  Treatment options include:  Counseling or talk therapy. Talk therapy is usually provided by mental health specialists.  Medicine. Certain medicines may help with depression, anxiety, and sleep.  Support groups. Support groups offer emotional support, advice, and guidance. They are made up of people who have had similar experiences. HOME CARE INSTRUCTIONS  Keep all follow-up visits as directed by your health care provider. This is important.  Take medicines only as directed by your health care  provider. SEEK MEDICAL CARE IF:  Your symptoms get worse.  SEEK IMMEDIATE MEDICAL CARE IF: You have serious thoughts about hurting yourself or someone else. MAKE SURE YOU:  Understand these instructions.  Will watch your condition.  Will get help right away if you are not doing well or get worse.   This information is not intended to replace advice given to you by your health care provider. Make sure you discuss any questions you have with your health care provider.   Document Released: 04/22/2006 Document Revised: 09/08/2014 Document Reviewed: 01/10/2014 Elsevier Interactive Patient Education [employer].
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
## 2015-10-10 ENCOUNTER — Encounter (HOSPITAL_COMMUNITY): Payer: Self-pay

## 2015-10-10 ENCOUNTER — Observation Stay (HOSPITAL_COMMUNITY)
Admission: EM | Admit: 2015-10-10 | Discharge: 2015-10-12 | Disposition: A | Discharge status: Home / Self Care | Payer: Medicaid Other | Attending: Internal Medicine | Admitting: Internal Medicine

## 2015-10-10 DIAGNOSIS — E876 Hypokalemia: Secondary | ICD-10-CM | POA: Diagnosis not present

## 2015-10-10 DIAGNOSIS — R109 Unspecified abdominal pain: Secondary | ICD-10-CM

## 2015-10-10 DIAGNOSIS — R569 Unspecified convulsions: Secondary | ICD-10-CM | POA: Diagnosis present

## 2015-10-10 DIAGNOSIS — D638 Anemia in other chronic diseases classified elsewhere: Secondary | ICD-10-CM | POA: Insufficient documentation

## 2015-10-10 DIAGNOSIS — D649 Anemia, unspecified: Secondary | ICD-10-CM | POA: Diagnosis not present

## 2015-10-10 DIAGNOSIS — F1010 Alcohol abuse, uncomplicated: Secondary | ICD-10-CM | POA: Diagnosis not present

## 2015-10-10 DIAGNOSIS — R000 Tachycardia, unspecified: Secondary | ICD-10-CM

## 2015-10-10 DIAGNOSIS — M25552 Pain in left hip: Secondary | ICD-10-CM | POA: Diagnosis not present

## 2015-10-10 DIAGNOSIS — G40909 Epilepsy, unspecified, not intractable, without status epilepticus: Secondary | ICD-10-CM | POA: Diagnosis not present

## 2015-10-10 DIAGNOSIS — Z79899 Other long term (current) drug therapy: Secondary | ICD-10-CM | POA: Diagnosis not present

## 2015-10-10 DIAGNOSIS — K219 Gastro-esophageal reflux disease without esophagitis: Secondary | ICD-10-CM | POA: Insufficient documentation

## 2015-10-10 DIAGNOSIS — F10939 Alcohol use, unspecified with withdrawal, unspecified: Secondary | ICD-10-CM

## 2015-10-10 DIAGNOSIS — F10239 Alcohol dependence with withdrawal, unspecified: Secondary | ICD-10-CM

## 2015-10-10 DIAGNOSIS — I10 Essential (primary) hypertension: Secondary | ICD-10-CM

## 2015-10-10 DIAGNOSIS — G319 Degenerative disease of nervous system, unspecified: Secondary | ICD-10-CM | POA: Diagnosis not present

## 2015-10-10 DIAGNOSIS — R55 Syncope and collapse: Secondary | ICD-10-CM

## 2015-10-10 LAB — URINE MICROSCOPIC-ADD ON

## 2015-10-10 LAB — BASIC METABOLIC PANEL
Anion gap: 18 — ABNORMAL HIGH (ref 5–15)
BUN: 7 mg/dL (ref 6–20)
CO2: 22 mmol/L (ref 22–32)
Calcium: 8.8 mg/dL — ABNORMAL LOW (ref 8.9–10.3)
Chloride: 101 mmol/L (ref 101–111)
Creatinine, Ser: 0.69 mg/dL (ref 0.61–1.24)
GFR calc Af Amer: >60 mL/min (ref >60)
GFR calc non Af Amer: >60 mL/min (ref >60)
Glucose, Bld: 148 mg/dL — ABNORMAL HIGH (ref 65–99)
Potassium: 3 mmol/L — ABNORMAL LOW (ref 3.5–5.1)
Sodium: 141 mmol/L (ref 135–145)

## 2015-10-10 LAB — URINALYSIS, ROUTINE W REFLEX MICROSCOPIC
Glucose, UA: NEGATIVE mg/dL
Ketones, ur: 40 mg/dL — AB
Leukocytes, UA: NEGATIVE
Nitrite: NEGATIVE
Protein, ur: 30 mg/dL — AB
Specific Gravity, Urine: 1.031 — ABNORMAL HIGH (ref 1.005–1.030)
pH: 5.5 (ref 5.0–8.0)

## 2015-10-10 LAB — CBC
HCT: 37 % — ABNORMAL LOW (ref 39.0–52.0)
Hemoglobin: 12.8 g/dL — ABNORMAL LOW (ref 13.0–17.0)
MCH: 34 pg (ref 26.0–34.0)
MCHC: 34.6 g/dL (ref 30.0–36.0)
MCV: 98.1 fL (ref 78.0–100.0)
Platelets: 213 10*3/uL (ref 150–400)
RBC: 3.77 MIL/uL — ABNORMAL LOW (ref 4.22–5.81)
RDW: 12.5 % (ref 11.5–15.5)
WBC: 3.7 10*3/uL — ABNORMAL LOW (ref 4.0–10.5)

## 2015-10-10 LAB — CBG MONITORING, ED: Glucose-Capillary: 153 mg/dL — ABNORMAL HIGH (ref 65–99)

## 2015-10-10 LAB — PHENYTOIN LEVEL, TOTAL: Phenytoin Lvl: <2.5 ug/mL — ABNORMAL LOW (ref 10.0–20.0)

## 2015-10-10 MED ORDER — LORAZEPAM 2 MG/ML IJ SOLN: 1.0000 mg | INTRAMUSCULAR | Status: DC | PRN

## 2015-10-10 MED ORDER — DIAZEPAM 5 MG PO TABS: 5.0000 mg | ORAL_TABLET | Freq: Once | ORAL | Status: DC

## 2015-10-10 MED ORDER — VITAMIN B-1 100 MG PO TABS
100.0000 mg | ORAL_TABLET | Freq: Every day | ORAL | Status: DC
  Administered 2015-10-10 – 2015-10-12 (×3): 100 mg via ORAL
  Filled 2015-10-10 (×3): qty 1

## 2015-10-10 MED ORDER — SODIUM CHLORIDE 0.9 % IV BOLUS (SEPSIS)
1000.0000 mL | Freq: Once | INTRAVENOUS | Status: AC
  Administered 2015-10-10: 1000 mL via INTRAVENOUS

## 2015-10-10 MED ORDER — PHENYTOIN SODIUM EXTENDED 100 MG PO CAPS
100.0000 mg | ORAL_CAPSULE | Freq: Three times a day (TID) | ORAL | Status: DC
  Administered 2015-10-10: 100 mg via ORAL
  Filled 2015-10-10 (×3): qty 1

## 2015-10-10 MED ORDER — HYDROCODONE-ACETAMINOPHEN 5-325 MG PO TABS
1.0000 | ORAL_TABLET | Freq: Four times a day (QID) | ORAL | Status: DC | PRN
  Administered 2015-10-11 (×2): 2 via ORAL
  Administered 2015-10-11 – 2015-10-12 (×2): 1 via ORAL
  Filled 2015-10-10 (×2): qty 1
  Filled 2015-10-10 (×2): qty 2

## 2015-10-10 MED ORDER — ENSURE ENLIVE PO LIQD
237.0000 mL | Freq: Two times a day (BID) | ORAL | Status: DC
  Administered 2015-10-11 – 2015-10-12 (×3): 237 mL via ORAL

## 2015-10-10 MED ORDER — DIAZEPAM 5 MG/ML IJ SOLN
5.0000 mg | Freq: Once | INTRAMUSCULAR | Status: AC
  Administered 2015-10-10: 5 mg via INTRAVENOUS
  Filled 2015-10-10: qty 2

## 2015-10-10 MED ORDER — ONDANSETRON HCL 4 MG/2ML IJ SOLN: 4.0000 mg | Freq: Four times a day (QID) | INTRAMUSCULAR | Status: DC | PRN

## 2015-10-10 MED ORDER — FOLIC ACID 1 MG PO TABS
1.0000 mg | ORAL_TABLET | Freq: Once | ORAL | Status: AC
  Administered 2015-10-10: 1 mg via ORAL
  Filled 2015-10-10: qty 1

## 2015-10-10 MED ORDER — CARVEDILOL 6.25 MG PO TABS
6.2500 mg | ORAL_TABLET | Freq: Once | ORAL | Status: AC
  Administered 2015-10-10: 6.25 mg via ORAL
  Filled 2015-10-10: qty 1

## 2015-10-10 MED ORDER — PHENYTOIN SODIUM EXTENDED 100 MG PO CAPS
200.0000 mg | ORAL_CAPSULE | Freq: Once | ORAL | Status: AC
  Administered 2015-10-10: 200 mg via ORAL
  Filled 2015-10-10 (×2): qty 2

## 2015-10-10 MED ORDER — THIAMINE HCL 100 MG/ML IJ SOLN
100.0000 mg | Freq: Once | INTRAMUSCULAR | Status: AC
  Administered 2015-10-10: 100 mg via INTRAVENOUS
  Filled 2015-10-10: qty 2

## 2015-10-10 MED ORDER — FOLIC ACID 1 MG PO TABS
1.0000 mg | ORAL_TABLET | Freq: Every day | ORAL | Status: DC
  Administered 2015-10-10 – 2015-10-12 (×3): 1 mg via ORAL
  Filled 2015-10-10 (×3): qty 1

## 2015-10-10 MED ORDER — IBUPROFEN 200 MG PO TABS
400.0000 mg | ORAL_TABLET | ORAL | Status: DC | PRN
  Administered 2015-10-10 – 2015-10-11 (×4): 400 mg via ORAL
  Filled 2015-10-10 (×4): qty 2

## 2015-10-10 MED ORDER — ACETAMINOPHEN 325 MG PO TABS: 650.0000 mg | ORAL_TABLET | Freq: Four times a day (QID) | ORAL | Status: DC | PRN

## 2015-10-10 MED ORDER — HYDROCODONE-ACETAMINOPHEN 5-325 MG PO TABS
2.0000 | ORAL_TABLET | Freq: Once | ORAL | Status: AC
Start: 2015-10-10 — End: 2015-10-10
  Administered 2015-10-10: 2 via ORAL
  Filled 2015-10-10: qty 2

## 2015-10-10 MED ORDER — SODIUM CHLORIDE 0.9 % IV SOLN
INTRAVENOUS | Status: DC
  Administered 2015-10-10 – 2015-10-11 (×2): via INTRAVENOUS

## 2015-10-10 MED ORDER — ONDANSETRON HCL 4 MG PO TABS: 4.0000 mg | ORAL_TABLET | Freq: Four times a day (QID) | ORAL | Status: DC | PRN

## 2015-10-10 MED ORDER — THIAMINE HCL 100 MG/ML IJ SOLN
100.0000 mg | Freq: Every day | INTRAMUSCULAR | Status: DC
  Filled 2015-10-10 (×2): qty 1

## 2015-10-10 MED ORDER — ENOXAPARIN SODIUM 40 MG/0.4ML ~~LOC~~ SOLN
40.0000 mg | SUBCUTANEOUS | Status: DC
  Administered 2015-10-10 – 2015-10-11 (×2): 40 mg via SUBCUTANEOUS
  Filled 2015-10-10 (×2): qty 0.4

## 2015-10-10 MED ORDER — ADULT MULTIVITAMIN W/MINERALS CH
1.0000 | ORAL_TABLET | Freq: Every day | ORAL | Status: DC
  Administered 2015-10-10 – 2015-10-12 (×3): 1 via ORAL
  Filled 2015-10-10 (×3): qty 1

## 2015-10-10 MED ORDER — POTASSIUM CHLORIDE CRYS ER 20 MEQ PO TBCR
60.0000 meq | EXTENDED_RELEASE_TABLET | Freq: Once | ORAL | Status: AC
  Administered 2015-10-10: 60 meq via ORAL
  Filled 2015-10-10: qty 3

## 2015-10-10 MED ORDER — LORAZEPAM 1 MG PO TABS
1.0000 mg | ORAL_TABLET | Freq: Four times a day (QID) | ORAL | Status: DC | PRN
  Administered 2015-10-11 (×2): 1 mg via ORAL
  Filled 2015-10-10 (×2): qty 1

## 2015-10-10 MED ORDER — PHENYTOIN SODIUM EXTENDED 100 MG PO CAPS
400.0000 mg | ORAL_CAPSULE | Freq: Once | ORAL | Status: AC
  Administered 2015-10-10: 400 mg via ORAL
  Filled 2015-10-10: qty 4

## 2015-10-10 MED ORDER — LORAZEPAM 2 MG/ML IJ SOLN
1.0000 mg | Freq: Four times a day (QID) | INTRAMUSCULAR | Status: DC | PRN
  Administered 2015-10-10: 1 mg via INTRAVENOUS
  Filled 2015-10-10: qty 1

## 2015-10-10 MED ORDER — ACETAMINOPHEN 650 MG RE SUPP: 650.0000 mg | Freq: Four times a day (QID) | RECTAL | Status: DC | PRN
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
## 2015-10-10 NOTE — Progress Notes (Signed)
EDCm consulted for medication assistance.  This patient has Medicaid [place] [employer].  Medications with this insurance are three dollars or less.  Patient does not qualify for medication assistance through [facility].  Patient's pcp is located at [place] as listed on his Medicaid card confirmed in EPIC.  No further EDCM needs at this time.

## 2015-10-11 ENCOUNTER — Observation Stay (HOSPITAL_COMMUNITY): Payer: Medicaid Other

## 2015-10-11 DIAGNOSIS — E876 Hypokalemia: Secondary | ICD-10-CM | POA: Diagnosis not present

## 2015-10-11 DIAGNOSIS — G40909 Epilepsy, unspecified, not intractable, without status epilepticus: Secondary | ICD-10-CM | POA: Diagnosis not present

## 2015-10-11 LAB — TSH: TSH: 2.779 u[IU]/mL (ref 0.350–4.500)

## 2015-10-11 LAB — BASIC METABOLIC PANEL
ANION GAP: 10 (ref 5–15)
BUN: 6 mg/dL (ref 6–20)
CALCIUM: 8.3 mg/dL — AB (ref 8.9–10.3)
CHLORIDE: 101 mmol/L (ref 101–111)
CO2: 26 mmol/L (ref 22–32)
CREATININE: 0.64 mg/dL (ref 0.61–1.24)
GLUCOSE: 106 mg/dL — AB (ref 65–99)
POTASSIUM: 3.6 mmol/L (ref 3.5–5.1)
SODIUM: 137 mmol/L (ref 135–145)

## 2015-10-11 LAB — RETICULOCYTES
RBC.: 3.46 MIL/uL — AB (ref 4.22–5.81)
RETIC COUNT ABSOLUTE: 51.9 10*3/uL (ref 19.0–186.0)
RETIC CT PCT: 1.5 % (ref 0.4–3.1)

## 2015-10-11 LAB — IRON AND TIBC
Iron: 185 ug/dL — ABNORMAL HIGH (ref 45–182)
Saturation Ratios: 89 % — ABNORMAL HIGH (ref 17.9–39.5)
TIBC: 207 ug/dL — ABNORMAL LOW (ref 250–450)
UIBC: 22 ug/dL

## 2015-10-11 LAB — VITAMIN B12: VITAMIN B 12: 508 pg/mL (ref 180–914)

## 2015-10-11 LAB — CBC
HCT: 34.4 % — ABNORMAL LOW (ref 39.0–52.0)
HEMOGLOBIN: 11.8 g/dL — AB (ref 13.0–17.0)
MCH: 34.1 pg — AB (ref 26.0–34.0)
MCHC: 34.3 g/dL (ref 30.0–36.0)
MCV: 99.4 fL (ref 78.0–100.0)
PLATELETS: 167 10*3/uL (ref 150–400)
RBC: 3.46 MIL/uL — ABNORMAL LOW (ref 4.22–5.81)
RDW: 12.5 % (ref 11.5–15.5)
WBC: 4.1 10*3/uL (ref 4.0–10.5)

## 2015-10-11 LAB — T4, FREE: FREE T4: 0.9 ng/dL (ref 0.61–1.12)

## 2015-10-11 LAB — FERRITIN: FERRITIN: 213 ng/mL (ref 24–336)

## 2015-10-11 LAB — FOLATE: Folate: 77.7 ng/mL (ref >5.9)

## 2015-10-11 MED ORDER — LEVETIRACETAM 500 MG PO TABS
500.0000 mg | ORAL_TABLET | Freq: Two times a day (BID) | ORAL | Status: DC
  Administered 2015-10-11 – 2015-10-12 (×3): 500 mg via ORAL
  Filled 2015-10-11 (×3): qty 1

## 2015-10-11 MED ORDER — DIPHENHYDRAMINE HCL 25 MG PO CAPS
25.0000 mg | ORAL_CAPSULE | ORAL | Status: DC | PRN
  Administered 2015-10-11 – 2015-10-12 (×3): 25 mg via ORAL
  Filled 2015-10-11 (×3): qty 1

## 2015-10-11 MED ORDER — DIPHENHYDRAMINE HCL 50 MG PO CAPS
50.0000 mg | ORAL_CAPSULE | Freq: Once | ORAL | Status: AC
  Administered 2015-10-11: 50 mg via ORAL
  Filled 2015-10-11: qty 1

## 2015-10-11 NOTE — Evaluation (Signed)
Physical Therapy Evaluation Patient Details Name: Joseph Haney MRN: [id number] DOB: [birth year] Today's Date: 10/11/2015   History of Present Illness  40 yo male admitted with possible seizure vs syncopal episode. hx of seizures, ETOH abuse.  Clinical Impression  On eval, pt required Min guard-Min assist for mobility-walked ~75 feet. Pt c/o dizziness while ambulating. LOB x1 especially with head turn. BP 144/98 after ambulation. Recommend daily ambulation with nursing supervision to encourage OOB/mobility. Due to fall risk, recommend 24 hour supervision/assist initially for safety and supervision for OOB activity.    Follow Up Recommendations Supervision for mobility/OOB; home safety evaluation    Equipment Recommendations  None recommended by PT (pt declines assistive device)    Recommendations for Other Services       Precautions / Restrictions Precautions Precautions: Fall Precaution Comments: pt c/o dizziness Restrictions Weight Bearing Restrictions: No      Mobility  Bed Mobility Overal bed mobility: Modified Independent                Transfers Overall transfer level: Needs assistance   Transfers: Sit to/from Stand Sit to Stand: Supervision         General transfer comment: for safety  Ambulation/Gait Ambulation/Gait assistance: Min guard;Min assist Ambulation Distance (Feet): 75 Feet Assistive device: None Gait Pattern/deviations: Step-through pattern;Decreased stride length;Drifts right/left     General Gait Details: LOB with head turn requiring small amount of assist to stabilize. Pt c/o dizziness during ambulation. Pt declined to ambulate further. Pt declined use of walker  Stairs            Wheelchair Mobility    Modified Rankin (Stroke Patients Only)       Balance Overall balance assessment: Needs assistance;History of Falls         Standing balance support: During functional activity Standing balance-Leahy Scale: Fair                                Pertinent Vitals/Pain Pain Assessment: Faces Faces Pain Scale: Hurts a little bit Pain Location: back Pain Descriptors / Indicators: Aching;Sore Pain Intervention(s): Monitored during session    Home Living Family/patient expects to be discharged to:: Private residence Living Arrangements: Spouse/significant other   Type of Home: House Home Access: Stairs to enter Entrance Stairs-Rails: Right Entrance Stairs-Number of Steps: 3 Home Layout: One level Home Equipment: None      Prior Function Level of Independence: Independent               Hand Dominance        Extremity/Trunk Assessment   Upper Extremity Assessment: Overall WFL for tasks assessed           Lower Extremity Assessment: Overall WFL for tasks assessed      Cervical / Trunk Assessment: Normal  Communication   Communication: No difficulties  Cognition Arousal/Alertness: Awake/alert Behavior During Therapy: WFL for tasks assessed/performed Overall Cognitive Status: Within Functional Limits for tasks assessed                      General Comments      Exercises        Assessment/Plan    PT Assessment Patient needs continued PT services  PT Diagnosis Difficulty walking   PT Problem List Decreased activity tolerance;Decreased balance;Decreased mobility  PT Treatment Interventions Gait training;Functional mobility training;Therapeutic activities;Patient/family education;Balance training;Therapeutic exercise   PT Goals (Current goals can be found in the  Care Plan section) Acute Rehab PT Goals Patient Stated Goal: to go home PT Goal Formulation: With patient Time For Goal Achievement: 10/25/15 Potential to Achieve Goals: Good    Frequency Min 3X/week   Barriers to discharge        Co-evaluation               End of Session Equipment Utilized During Treatment: Gait belt Activity Tolerance: Other (comment) (limited by  dizziness) Patient left: in bed;with call bell/phone within reach;with bed alarm set;Other (comment);with family/visitor present (seizure precaution pads in place on rails)      Functional Assessment Tool Used: clinical judgement Functional Limitation: Mobility: Walking and moving around Mobility: Walking and Moving Around Current Status [phone]): At least 1 percent but less than 20 percent impaired, limited or restricted Mobility: Walking and Moving Around Goal Status [phone]): At least 1 percent but less than 20 percent impaired, limited or restricted    Time: [id number] PT Time Calculation (min) (ACUTE ONLY): 9 min   Charges:   PT Evaluation $PT Eval Moderate Complexity: 1 Procedure     PT G Codes:   PT G-Codes **NOT FOR INPATIENT CLASS** Functional Assessment Tool Used: clinical judgement Functional Limitation: Mobility: Walking and moving around Mobility: Walking and Moving Around Current Status [id number]): At least 1 percent but less than 20 percent impaired, limited or restricted Mobility: Walking and Moving Around Goal Status [phone]): At least 1 percent but less than 20 percent impaired, limited or restricted    Weston Anna, MPT Pager: [phone]
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
## 2015-10-11 NOTE — Care Management Note (Signed)
Case Management Note  Patient Details  Name: Joseph Haney MRN: [id number] Date of Birth: Jan 16, 1976  Subjective/Objective: 40 y/o m admitted w/Seizure. From home. Referral for affording meds. Insurance is medicaid-$3 co pay or less. For verification of Keppra will need MD to fax script to patient's pharmacy so they can verify if covered by medicaid.Nsg notified.                   Action/Plan:d/c plan home.   Expected Discharge Date:   (unknown)               Expected Discharge Plan:  Home/Self Care  In-House Referral:     Discharge planning Services  CM Consult, Medication Assistance  Post Acute Care Choice:    Choice offered to:     DME Arranged:    DME Agency:     HH Arranged:    HH Agency:     Status of Service:  In process, will continue to follow  Medicare Important Message Given:    Date Medicare IM Given:    Medicare IM give by:    Date Additional Medicare IM Given:    Additional Medicare Important Message give by:     If discussed at [place] of Stay Meetings, dates discussed:    Additional Comments:  Dessa Phi, RN 10/11/2015, 12:37 PM
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
## 2015-10-11 NOTE — Progress Notes (Signed)
Echocardiogram 2D Echocardiogram has been performed.  Joseph Haney 10/11/2015, 3:57 PM

## 2015-10-12 DIAGNOSIS — E876 Hypokalemia: Secondary | ICD-10-CM | POA: Diagnosis not present

## 2015-10-12 DIAGNOSIS — G40909 Epilepsy, unspecified, not intractable, without status epilepticus: Secondary | ICD-10-CM | POA: Diagnosis not present

## 2015-10-12 MED ORDER — LEVETIRACETAM 500 MG PO TABS: 500.0000 mg | ORAL_TABLET | Freq: Two times a day (BID) | ORAL | Status: DC

## 2015-10-12 MED ORDER — CARVEDILOL 6.25 MG PO TABS: 12.5000 mg | ORAL_TABLET | Freq: Two times a day (BID) | ORAL | Status: DC
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
## 2015-10-12 NOTE — Care Management Obs Status (Deleted)
[place] NOTIFICATION   Patient Details  Name: Joseph Haney MRN: [id number] Date of Birth: 1976-06-09   Medicare Observation Status Notification Given:  Yes    MahabirJuliann Pulse, RN 10/12/2015, 10:43 AM
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
## 2015-10-12 NOTE — Care Management Note (Signed)
Case Management Note  Patient Details  Name: Joseph Haney MRN: [id number] Date of Birth: 1976/01/31  Subjective/Objective:                    Action/Plan:d/c home no further d/c needs or orders.   Expected Discharge Date:   (unknown)               Expected Discharge Plan:  Home/Self Care  In-House Referral:     Discharge planning Services  CM Consult, Medication Assistance  Post Acute Care Choice:    Choice offered to:     DME Arranged:    DME Agency:     HH Arranged:    [place] Agency:     Status of Service:  Completed, signed off  Medicare Important Message Given:    Date Medicare IM Given:    Medicare IM give by:    Date Additional Medicare IM Given:    Additional Medicare Important Message give by:     If discussed at [place] of Stay Meetings, dates discussed:    Additional Comments:  Dessa Phi, RN 10/12/2015, 11:21 AM

## 2015-10-12 NOTE — Progress Notes (Signed)
Went over all discharge information with patient and family. All questions answered.  Discharge summary given to pt fiance, and made aware prescriptions have been sent.  Pt wheeled out by NT.
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
## 2015-10-13 ENCOUNTER — Emergency Department (HOSPITAL_COMMUNITY)
Admission: EM | Admit: 2015-10-13 | Discharge: 2015-10-14 | Disposition: A | Discharge status: Home / Self Care | Payer: Medicaid Other | Attending: Emergency Medicine | Admitting: Emergency Medicine

## 2015-10-13 ENCOUNTER — Emergency Department (HOSPITAL_COMMUNITY): Payer: Medicaid Other

## 2015-10-13 ENCOUNTER — Encounter (HOSPITAL_COMMUNITY): Payer: Self-pay | Admitting: Emergency Medicine

## 2015-10-13 DIAGNOSIS — Z8719 Personal history of other diseases of the digestive system: Secondary | ICD-10-CM | POA: Insufficient documentation

## 2015-10-13 DIAGNOSIS — S0001XA Abrasion of scalp, initial encounter: Secondary | ICD-10-CM | POA: Diagnosis not present

## 2015-10-13 DIAGNOSIS — G40909 Epilepsy, unspecified, not intractable, without status epilepticus: Secondary | ICD-10-CM | POA: Diagnosis not present

## 2015-10-13 DIAGNOSIS — Y9301 Activity, walking, marching and hiking: Secondary | ICD-10-CM | POA: Insufficient documentation

## 2015-10-13 DIAGNOSIS — S5011XA Contusion of right forearm, initial encounter: Secondary | ICD-10-CM | POA: Insufficient documentation

## 2015-10-13 DIAGNOSIS — S29001A Unspecified injury of muscle and tendon of front wall of thorax, initial encounter: Secondary | ICD-10-CM | POA: Diagnosis not present

## 2015-10-13 DIAGNOSIS — S5012XA Contusion of left forearm, initial encounter: Secondary | ICD-10-CM | POA: Insufficient documentation

## 2015-10-13 DIAGNOSIS — R569 Unspecified convulsions: Secondary | ICD-10-CM | POA: Diagnosis present

## 2015-10-13 DIAGNOSIS — Y998 Other external cause status: Secondary | ICD-10-CM | POA: Insufficient documentation

## 2015-10-13 DIAGNOSIS — Y92524 Gas station as the place of occurrence of the external cause: Secondary | ICD-10-CM | POA: Diagnosis not present

## 2015-10-13 DIAGNOSIS — Z79899 Other long term (current) drug therapy: Secondary | ICD-10-CM | POA: Insufficient documentation

## 2015-10-13 LAB — CBG MONITORING, ED: Glucose-Capillary: 90 mg/dL (ref 65–99)

## 2015-10-13 LAB — I-STAT CHEM 8, ED
CHLORIDE: 100 mmol/L — AB (ref 101–111)
CREATININE: 0.5 mg/dL — AB (ref 0.61–1.24)
Calcium, Ion: 1.08 mmol/L — ABNORMAL LOW (ref 1.12–1.23)
Glucose, Bld: 93 mg/dL (ref 65–99)
HEMATOCRIT: 36 % — AB (ref 39.0–52.0)
Hemoglobin: 12.2 g/dL — ABNORMAL LOW (ref 13.0–17.0)
POTASSIUM: 3.6 mmol/L (ref 3.5–5.1)
SODIUM: 139 mmol/L (ref 135–145)
TCO2: 27 mmol/L (ref 0–100)

## 2015-10-13 LAB — PHENYTOIN LEVEL, TOTAL

## 2015-10-13 LAB — ETHANOL: Alcohol, Ethyl (B): <5 mg/dL (ref <5)

## 2015-10-13 MED ORDER — SODIUM CHLORIDE 0.9 % IV BOLUS (SEPSIS)
1000.0000 mL | Freq: Once | INTRAVENOUS | Status: AC
  Administered 2015-10-13: 1000 mL via INTRAVENOUS

## 2015-10-13 MED ORDER — SODIUM CHLORIDE 0.9 % IV BOLUS (SEPSIS)
500.0000 mL | Freq: Once | INTRAVENOUS | Status: AC
  Administered 2015-10-14: 500 mL via INTRAVENOUS

## 2015-10-13 MED ORDER — TETANUS-DIPHTH-ACELL PERTUSSIS 5-2.5-18.5 LF-MCG/0.5 IM SUSP
0.5000 mL | Freq: Once | INTRAMUSCULAR | Status: DC
  Filled 2015-10-13: qty 0.5

## 2015-10-13 MED ORDER — SODIUM CHLORIDE 0.9 % IV SOLN
1000.0000 mg | Freq: Once | INTRAVENOUS | Status: AC
  Administered 2015-10-13: 1000 mg via INTRAVENOUS
  Filled 2015-10-13: qty 10

## 2015-10-13 MED ORDER — KETOROLAC TROMETHAMINE 30 MG/ML IJ SOLN
30.0000 mg | Freq: Once | INTRAMUSCULAR | Status: AC
  Administered 2015-10-13: 30 mg via INTRAVENOUS
  Filled 2015-10-13: qty 1
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
## 2015-10-13 NOTE — ED Notes (Signed)
Bed: WA09 Expected date:  Expected time:  Means of arrival:  Comments: 39
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
## 2015-10-14 MED ORDER — METHOCARBAMOL 500 MG PO TABS: 500.0000 mg | ORAL_TABLET | Freq: Two times a day (BID) | ORAL | Status: DC

## 2015-10-14 MED ORDER — IBUPROFEN 800 MG PO TABS: 800.0000 mg | ORAL_TABLET | Freq: Three times a day (TID) | ORAL | Status: DC
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
## 2015-11-19 ENCOUNTER — Emergency Department (HOSPITAL_COMMUNITY)
Admission: EM | Admit: 2015-11-19 | Discharge: 2015-11-20 | Disposition: A | Discharge status: Home / Self Care | Payer: Medicaid Other | Attending: Emergency Medicine | Admitting: Emergency Medicine

## 2015-11-19 ENCOUNTER — Encounter (HOSPITAL_COMMUNITY): Payer: Self-pay | Admitting: Emergency Medicine

## 2015-11-19 DIAGNOSIS — Z79899 Other long term (current) drug therapy: Secondary | ICD-10-CM | POA: Diagnosis not present

## 2015-11-19 DIAGNOSIS — K922 Gastrointestinal hemorrhage, unspecified: Secondary | ICD-10-CM | POA: Diagnosis not present

## 2015-11-19 DIAGNOSIS — K625 Hemorrhage of anus and rectum: Secondary | ICD-10-CM | POA: Diagnosis present

## 2015-11-19 LAB — CBC
HCT: 33.9 % — ABNORMAL LOW (ref 39.0–52.0)
Hemoglobin: 12 g/dL — ABNORMAL LOW (ref 13.0–17.0)
MCH: 33.5 pg (ref 26.0–34.0)
MCHC: 35.4 g/dL (ref 30.0–36.0)
MCV: 94.7 fL (ref 78.0–100.0)
PLATELETS: 248 10*3/uL (ref 150–400)
RBC: 3.58 MIL/uL — ABNORMAL LOW (ref 4.22–5.81)
RDW: 13.8 % (ref 11.5–15.5)
WBC: 5 10*3/uL (ref 4.0–10.5)

## 2015-11-19 LAB — COMPREHENSIVE METABOLIC PANEL
ALK PHOS: 118 U/L (ref 38–126)
ALT: 28 U/L (ref 17–63)
AST: 137 U/L — AB (ref 15–41)
Albumin: 4.1 g/dL (ref 3.5–5.0)
Anion gap: 17 — ABNORMAL HIGH (ref 5–15)
BUN: 7 mg/dL (ref 6–20)
CALCIUM: 8.6 mg/dL — AB (ref 8.9–10.3)
CHLORIDE: 105 mmol/L (ref 101–111)
CO2: 23 mmol/L (ref 22–32)
CREATININE: 0.61 mg/dL (ref 0.61–1.24)
Glucose, Bld: 118 mg/dL — ABNORMAL HIGH (ref 65–99)
Potassium: 2.5 mmol/L — CL (ref 3.5–5.1)
Sodium: 145 mmol/L (ref 135–145)
Total Bilirubin: 1 mg/dL (ref 0.3–1.2)
Total Protein: 8.1 g/dL (ref 6.5–8.1)

## 2015-11-19 LAB — TYPE AND SCREEN
ABO/RH(D): O POS
Antibody Screen: NEGATIVE

## 2015-11-19 NOTE — ED Notes (Signed)
Pt states he has had rectal bleeding x 3 days. Unsure of the cause. Bright red. Alert and oriented.

## 2015-11-20 ENCOUNTER — Encounter: Payer: Self-pay | Admitting: Gastroenterology

## 2015-11-20 LAB — CBC
HCT: 33 % — ABNORMAL LOW (ref 39.0–52.0)
Hemoglobin: 11.8 g/dL — ABNORMAL LOW (ref 13.0–17.0)
MCH: 33.7 pg (ref 26.0–34.0)
MCHC: 35.8 g/dL (ref 30.0–36.0)
MCV: 94.3 fL (ref 78.0–100.0)
PLATELETS: 250 10*3/uL (ref 150–400)
RBC: 3.5 MIL/uL — AB (ref 4.22–5.81)
RDW: 14 % (ref 11.5–15.5)
WBC: 5.3 10*3/uL (ref 4.0–10.5)

## 2015-11-20 LAB — ABO/RH: ABO/RH(D): O POS

## 2015-11-20 LAB — PROTIME-INR
INR: 1.14 (ref 0.00–1.49)
PROTHROMBIN TIME: 14.8 s (ref 11.6–15.2)

## 2015-11-20 LAB — POC OCCULT BLOOD, ED: FECAL OCCULT BLD: POSITIVE — AB

## 2015-11-20 MED ORDER — POTASSIUM CHLORIDE CRYS ER 20 MEQ PO TBCR
40.0000 meq | EXTENDED_RELEASE_TABLET | Freq: Once | ORAL | Status: AC
  Administered 2015-11-20: 40 meq via ORAL
  Filled 2015-11-20: qty 2

## 2015-11-20 MED ORDER — OMEPRAZOLE 20 MG PO CPDR: 20.0000 mg | DELAYED_RELEASE_CAPSULE | Freq: Every day | ORAL | Status: DC

## 2015-11-20 MED ORDER — SUCRALFATE 1 G PO TABS
1.0000 g | ORAL_TABLET | Freq: Once | ORAL | Status: AC
  Administered 2015-11-20: 1 g via ORAL
  Filled 2015-11-20: qty 1

## 2015-11-20 MED ORDER — FAMOTIDINE IN NACL 20-0.9 MG/50ML-% IV SOLN
20.0000 mg | Freq: Once | INTRAVENOUS | Status: AC
  Administered 2015-11-20: 20 mg via INTRAVENOUS
  Filled 2015-11-20: qty 50

## 2015-11-20 MED ORDER — OXYCODONE HCL 5 MG PO TABS
5.0000 mg | ORAL_TABLET | Freq: Once | ORAL | Status: AC
  Administered 2015-11-20: 5 mg via ORAL
  Filled 2015-11-20: qty 1

## 2015-11-20 MED ORDER — MAGNESIUM SULFATE 2 GM/50ML IV SOLN
2.0000 g | Freq: Once | INTRAVENOUS | Status: AC
  Administered 2015-11-20: 2 g via INTRAVENOUS
  Filled 2015-11-20: qty 50

## 2015-11-20 NOTE — ED Provider Notes (Signed)
CSN: [id number]     Arrival date & time 11/19/15  2106 History  By signing my name below, I, Eustaquio Maize, attest that this documentation has been prepared under the direction and in the presence of Varney Biles, MD. Electronically Signed: Eustaquio Maize, ED Scribe. 11/20/2015. 12:32 AM.   Chief Complaint  Patient presents with  . Rectal Bleeding   The history is provided by the patient. No language interpreter was used.     HPI Comments: Joseph Haney is a 40 y.o. male who presents to the Emergency Department complaining of constant, rectal bleeding x 3 days. Pt describes the bowel movements as bright red with blood clots. He estimates 10-12 bowel movements per day. Pt have never had hx of rectal bleeding in the past. Pt also complains of hematuria, abdominal pain, and a headache. No hx liver disease. Pt admits to alcohol abuse. He reports hx of hemorrhoids which were excised. Denies nausea, vomiting, melena, or any other associated symptoms. No fevers, no hx of similar symptoms in the past.   Past Medical History  Diagnosis Date  . Seizure ([place])   . Alcohol abuse   . GERD (gastroesophageal reflux disease)    Past Surgical History  Procedure Laterality Date  . No past surgeries     Family History  Problem Relation Age of Onset  . Coronary artery disease Mother   . Coronary artery disease Father   . Heart attack Father 11   Social History  Substance Use Topics  . Smoking status: Never Smoker   . Smokeless tobacco: Never Used  . Alcohol Use: 0.0 oz/week     Comment: one 40 ounce a week    Review of Systems  A complete 10 system review of systems was obtained and all systems are negative except as noted in the HPI and PMH.   Allergies  Morphine and related and Other  Home Medications   Prior to Admission medications   Medication Sig Start Date End Date Taking? Authorizing Provider  carvedilol (COREG) 6.25 MG tablet Take 2 tablets (12.5 mg total) by mouth 2 (two)  times daily with a meal. 10/12/15  Yes Debbe Odea, MD  docusate sodium (COLACE) 100 MG capsule Take 100 mg by mouth daily.   Yes Historical Provider, MD  ibuprofen (ADVIL,MOTRIN) 800 MG tablet Take 1 tablet (800 mg total) by mouth 3 (three) times daily. Patient taking differently: Take 800 mg by mouth every 8 (eight) hours as needed for headache, mild pain or moderate pain.  10/14/15  Yes Domenic Moras, [name]  levETIRAcetam (KEPPRA) 500 MG tablet Take 1 tablet (500 mg total) by mouth 2 (two) times daily. 10/12/15  Yes Debbe Odea, MD  methocarbamol (ROBAXIN) 500 MG tablet Take 1 tablet (500 mg total) by mouth 2 (two) times daily. Patient taking differently: Take 500 mg by mouth 2 (two) times daily as needed for muscle spasms.  10/14/15  Yes Domenic Moras, [name]  omeprazole (PRILOSEC) 20 MG capsule Take 1 capsule (20 mg total) by mouth daily. 11/20/15   Elliona Doddridge, MD   BP 132/97 mmHg  Pulse 97  Temp(Src) 99.5 F (37.5 C) (Oral)  Resp 16  Ht 5\' 9"  (1.753 m)  Wt 138 lb (62.596 kg)  BMI 20.37 kg/m2  SpO2 100%   Physical Exam  Constitutional: He is oriented to person, place, and time. He appears well-developed and well-nourished. No distress.  HENT:  Head: Normocephalic and atraumatic.  Eyes: Conjunctivae and EOM are normal.  Neck: Neck supple.  No tracheal deviation present.  Cardiovascular: Normal rate.   Pulmonary/Chest: Effort normal. No respiratory distress.  Abdominal: Soft. There is tenderness.  Tenderness at level of umbilicus without any rebound or guarding.   Genitourinary: Rectal exam shows no external hemorrhoid and no fissure.  Musculoskeletal: Normal range of motion.  Neurological: He is alert and oriented to person, place, and time.  Skin: Skin is warm and dry.  Psychiatric: He has a normal mood and affect. His behavior is normal.  Nursing note and vitals reviewed.   ED Course  Procedures (including critical care time)  DIAGNOSTIC STUDIES: Oxygen Saturation is 100% on RA,  normal by my interpretation.    COORDINATION OF CARE: 12:27 AM-Discussed treatment plan which includes rectal exam with pt at bedside and pt agreed to plan.   Labs Review Labs Reviewed  COMPREHENSIVE METABOLIC PANEL - Abnormal; Notable for the following:    Potassium 2.5 (*)    Glucose, Bld 118 (*)    Calcium 8.6 (*)    AST 137 (*)    Anion gap 17 (*)    All other components within normal limits  CBC - Abnormal; Notable for the following:    RBC 3.58 (*)    Hemoglobin 12.0 (*)    HCT 33.9 (*)    All other components within normal limits  CBC - Abnormal; Notable for the following:    RBC 3.50 (*)    Hemoglobin 11.8 (*)    HCT 33.0 (*)    All other components within normal limits  POC OCCULT BLOOD, ED - Abnormal; Notable for the following:    Fecal Occult Bld POSITIVE (*)    All other components within normal limits  PROTIME-INR  TYPE AND SCREEN  ABO/RH    Imaging Review No results found. I have personally reviewed and evaluated these lab results as part of my medical decision-making.   EKG Interpretation None      MDM   Final diagnoses:  Lower GI bleed    I personally performed the services described in this documentation, which was scribed in my presence. The recorded information has been reviewed and is accurate.  Pt comes in with BRBPR. Pt has hx of alcoholism. Abd exam is benign. BUN is normal - so unlikely upper gi in source. PT has mild tenderness in the periumbilical spot. No hemorrhoids. Will d/c.     Varney Biles, MD 11/20/15 [phone]

## 2015-11-20 NOTE — ED Notes (Signed)
Patient instructed to call [facility] today to schedule appointment for further evaluation and return to ED if symptoms become severe prior to appointment with GI.

## 2015-11-20 NOTE — Discharge Instructions (Signed)
We saw you in the ER for the bloody stools. All the results in the ER are stable and within normal limits. The stool did show small amount of blood in it. We are not sure what is causing the bleed at this time - you will need to see a GI doctor for further evaluation.  Return to the ER if the symptoms get severe, you have associated dizziness, fainting.   Gastrointestinal Bleeding Gastrointestinal (GI) bleeding means there is bleeding somewhere along the digestive tract, between the mouth and anus. CAUSES  There are many different problems that can cause GI bleeding. Possible causes include:  Esophagitis. This is inflammation, irritation, or swelling of the esophagus.  Hemorrhoids.These are veins that are full of blood (engorged) in the rectum. They cause pain, inflammation, and may bleed.  Anal fissures.These are areas of painful tearing which may bleed. They are often caused by passing hard stool.  Diverticulosis.These are pouches that form on the colon over time, with age, and may bleed significantly.  Diverticulitis.This is inflammation in areas with diverticulosis. It can cause pain, fever, and bloody stools, although bleeding is rare.  Polyps and cancer. Colon cancer often starts out as precancerous polyps.  Gastritis and ulcers.Bleeding from the upper gastrointestinal tract (near the stomach) may travel through the intestines and produce black, sometimes tarry, often bad smelling stools. In certain cases, if the bleeding is fast enough, the stools may not be black, but red. This condition may be life-threatening. SYMPTOMS   Vomiting bright red blood or material that looks like coffee grounds.  Bloody, black, or tarry stools. DIAGNOSIS  Your caregiver may diagnose your condition by taking your history and performing a physical exam. More tests may be needed, including:  X-rays and other imaging tests.  Esophagogastroduodenoscopy (EGD). This test uses a flexible, lighted  tube to look at your esophagus, stomach, and small intestine.  Colonoscopy. This test uses a flexible, lighted tube to look at your colon. TREATMENT  Treatment depends on the cause of your bleeding.   For bleeding from the esophagus, stomach, small intestine, or colon, the caregiver doing your EGD or colonoscopy may be able to stop the bleeding as part of the procedure.  Inflammation or infection of the colon can be treated with medicines.  Many rectal problems can be treated with creams, suppositories, or warm baths.  Surgery is sometimes needed.  Blood transfusions are sometimes needed if you have lost a lot of blood. If bleeding is slow, you may be allowed to go home. If there is a lot of bleeding, you will need to stay in the hospital for observation. HOME CARE INSTRUCTIONS   Take any medicines exactly as prescribed.  Keep your stools soft by eating foods that are high in fiber. These foods include whole grains, legumes, fruits, and vegetables. Prunes (1 to 3 a day) work well for many people.  Drink enough fluids to keep your urine clear or pale yellow. SEEK IMMEDIATE MEDICAL CARE IF:   Your bleeding increases.  You feel lightheaded, weak, or you faint.  You have severe cramps in your back or abdomen.  You pass large blood clots in your stool.  Your problems are getting worse. MAKE SURE YOU:   Understand these instructions.  Will watch your condition.  Will get help right away if you are not doing well or get worse.   This information is not intended to replace advice given to you by your health care provider. Make sure you discuss any  questions you have with your health care provider.   Document Released: 08/15/2000 Document Revised: 08/04/2012 Document Reviewed: 02/05/2015 Elsevier Interactive Patient Education [employer].

## 2015-11-25 ENCOUNTER — Inpatient Hospital Stay (HOSPITAL_COMMUNITY)
Admission: EM | Admit: 2015-11-25 | Discharge: 2015-11-30 | DRG: 391 | Disposition: A | Discharge status: Home / Self Care | Payer: Medicaid Other | Attending: Internal Medicine | Admitting: Internal Medicine

## 2015-11-25 ENCOUNTER — Emergency Department (HOSPITAL_COMMUNITY): Payer: Medicaid Other

## 2015-11-25 ENCOUNTER — Encounter (HOSPITAL_COMMUNITY): Payer: Self-pay | Admitting: Emergency Medicine

## 2015-11-25 DIAGNOSIS — F1010 Alcohol abuse, uncomplicated: Secondary | ICD-10-CM | POA: Diagnosis present

## 2015-11-25 DIAGNOSIS — Z885 Allergy status to narcotic agent status: Secondary | ICD-10-CM

## 2015-11-25 DIAGNOSIS — D649 Anemia, unspecified: Secondary | ICD-10-CM | POA: Diagnosis present

## 2015-11-25 DIAGNOSIS — I10 Essential (primary) hypertension: Secondary | ICD-10-CM | POA: Diagnosis present

## 2015-11-25 DIAGNOSIS — E876 Hypokalemia: Secondary | ICD-10-CM | POA: Diagnosis present

## 2015-11-25 DIAGNOSIS — R319 Hematuria, unspecified: Secondary | ICD-10-CM | POA: Diagnosis present

## 2015-11-25 DIAGNOSIS — Z6820 Body mass index (BMI) 20.0-20.9, adult: Secondary | ICD-10-CM

## 2015-11-25 DIAGNOSIS — Z8249 Family history of ischemic heart disease and other diseases of the circulatory system: Secondary | ICD-10-CM

## 2015-11-25 DIAGNOSIS — K529 Noninfective gastroenteritis and colitis, unspecified: Secondary | ICD-10-CM | POA: Diagnosis present

## 2015-11-25 DIAGNOSIS — G40909 Epilepsy, unspecified, not intractable, without status epilepticus: Secondary | ICD-10-CM | POA: Diagnosis present

## 2015-11-25 DIAGNOSIS — Z9119 Patient's noncompliance with other medical treatment and regimen: Secondary | ICD-10-CM

## 2015-11-25 DIAGNOSIS — E43 Unspecified severe protein-calorie malnutrition: Secondary | ICD-10-CM | POA: Diagnosis present

## 2015-11-25 DIAGNOSIS — Z79899 Other long term (current) drug therapy: Secondary | ICD-10-CM

## 2015-11-25 DIAGNOSIS — N390 Urinary tract infection, site not specified: Secondary | ICD-10-CM | POA: Insufficient documentation

## 2015-11-25 DIAGNOSIS — K625 Hemorrhage of anus and rectum: Secondary | ICD-10-CM | POA: Diagnosis present

## 2015-11-25 DIAGNOSIS — Z91199 Patient's noncompliance with other medical treatment and regimen due to unspecified reason: Secondary | ICD-10-CM

## 2015-11-25 DIAGNOSIS — R300 Dysuria: Secondary | ICD-10-CM | POA: Diagnosis present

## 2015-11-25 DIAGNOSIS — R197 Diarrhea, unspecified: Secondary | ICD-10-CM

## 2015-11-25 DIAGNOSIS — R740 Nonspecific elevation of levels of transaminase and lactic acid dehydrogenase [LDH]: Secondary | ICD-10-CM | POA: Diagnosis present

## 2015-11-25 DIAGNOSIS — K219 Gastro-esophageal reflux disease without esophagitis: Secondary | ICD-10-CM | POA: Diagnosis present

## 2015-11-25 DIAGNOSIS — R569 Unspecified convulsions: Secondary | ICD-10-CM

## 2015-11-25 DIAGNOSIS — A084 Viral intestinal infection, unspecified: Principal | ICD-10-CM | POA: Diagnosis present

## 2015-11-25 LAB — COMPREHENSIVE METABOLIC PANEL
ALBUMIN: 4.6 g/dL (ref 3.5–5.0)
ALT: 28 U/L (ref 17–63)
ANION GAP: 16 — AB (ref 5–15)
AST: 143 U/L — AB (ref 15–41)
Alkaline Phosphatase: 111 U/L (ref 38–126)
CALCIUM: 9 mg/dL (ref 8.9–10.3)
CO2: 24 mmol/L (ref 22–32)
Chloride: 99 mmol/L — ABNORMAL LOW (ref 101–111)
Creatinine, Ser: 0.59 mg/dL — ABNORMAL LOW (ref 0.61–1.24)
GFR calc Af Amer: >60 mL/min (ref >60)
GFR calc non Af Amer: >60 mL/min (ref >60)
GLUCOSE: 118 mg/dL — AB (ref 65–99)
Potassium: 2.5 mmol/L — CL (ref 3.5–5.1)
Sodium: 139 mmol/L (ref 135–145)
TOTAL PROTEIN: 9.2 g/dL — AB (ref 6.5–8.1)
Total Bilirubin: 2 mg/dL — ABNORMAL HIGH (ref 0.3–1.2)

## 2015-11-25 LAB — URINE MICROSCOPIC-ADD ON

## 2015-11-25 LAB — PROTIME-INR
INR: 1.14 (ref 0.00–1.49)
Prothrombin Time: 14.4 seconds (ref 11.6–15.2)

## 2015-11-25 LAB — CBC WITH DIFFERENTIAL/PLATELET
BASOS ABS: 0 10*3/uL (ref 0.0–0.1)
BASOS PCT: 0 %
EOS PCT: 0 %
Eosinophils Absolute: 0 10*3/uL (ref 0.0–0.7)
HEMATOCRIT: 34.7 % — AB (ref 39.0–52.0)
Hemoglobin: 12.6 g/dL — ABNORMAL LOW (ref 13.0–17.0)
Lymphocytes Relative: 16 %
Lymphs Abs: 1 10*3/uL (ref 0.7–4.0)
MCH: 34.2 pg — ABNORMAL HIGH (ref 26.0–34.0)
MCHC: 36.3 g/dL — ABNORMAL HIGH (ref 30.0–36.0)
MCV: 94.3 fL (ref 78.0–100.0)
Monocytes Absolute: 0.5 10*3/uL (ref 0.1–1.0)
Monocytes Relative: 7 %
NEUTROS ABS: 5.1 10*3/uL (ref 1.7–7.7)
Neutrophils Relative %: 77 %
PLATELETS: 232 10*3/uL (ref 150–400)
RBC: 3.68 MIL/uL — ABNORMAL LOW (ref 4.22–5.81)
RDW: 14.2 % (ref 11.5–15.5)
WBC: 6.6 10*3/uL (ref 4.0–10.5)

## 2015-11-25 LAB — POC OCCULT BLOOD, ED: Fecal Occult Bld: POSITIVE — AB

## 2015-11-25 LAB — URINALYSIS, ROUTINE W REFLEX MICROSCOPIC
Glucose, UA: NEGATIVE mg/dL
Hgb urine dipstick: NEGATIVE
Ketones, ur: 40 mg/dL — AB
NITRITE: POSITIVE — AB
PH: 5.5 (ref 5.0–8.0)
Protein, ur: 100 mg/dL — AB
SPECIFIC GRAVITY, URINE: 1.04 — AB (ref 1.005–1.030)

## 2015-11-25 LAB — MAGNESIUM: MAGNESIUM: 1.2 mg/dL — AB (ref 1.7–2.4)

## 2015-11-25 LAB — CBG MONITORING, ED: GLUCOSE-CAPILLARY: 122 mg/dL — AB (ref 65–99)

## 2015-11-25 LAB — ETHANOL

## 2015-11-25 MED ORDER — MAGNESIUM SULFATE 2 GM/50ML IV SOLN
2.0000 g | Freq: Once | INTRAVENOUS | Status: AC
  Administered 2015-11-25: 2 g via INTRAVENOUS
  Filled 2015-11-25: qty 50

## 2015-11-25 MED ORDER — MAGNESIUM OXIDE 400 (241.3 MG) MG PO TABS
400.0000 mg | ORAL_TABLET | Freq: Once | ORAL | Status: AC
  Administered 2015-11-25: 400 mg via ORAL
  Filled 2015-11-25: qty 1

## 2015-11-25 MED ORDER — SODIUM CHLORIDE 0.9 % IV BOLUS (SEPSIS)
1000.0000 mL | Freq: Once | INTRAVENOUS | Status: AC
  Administered 2015-11-25: 1000 mL via INTRAVENOUS

## 2015-11-25 MED ORDER — POTASSIUM CHLORIDE CRYS ER 20 MEQ PO TBCR
40.0000 meq | EXTENDED_RELEASE_TABLET | Freq: Once | ORAL | Status: AC
  Administered 2015-11-25: 40 meq via ORAL
  Filled 2015-11-25: qty 2

## 2015-11-25 MED ORDER — HYDROMORPHONE HCL 1 MG/ML IJ SOLN
1.0000 mg | INTRAMUSCULAR | Status: DC | PRN
  Administered 2015-11-26 – 2015-11-28 (×9): 1 mg via INTRAVENOUS
  Filled 2015-11-25 (×9): qty 1

## 2015-11-25 MED ORDER — FAMOTIDINE IN NACL 20-0.9 MG/50ML-% IV SOLN
20.0000 mg | Freq: Once | INTRAVENOUS | Status: AC
  Administered 2015-11-25: 20 mg via INTRAVENOUS
  Filled 2015-11-25: qty 50

## 2015-11-25 MED ORDER — DIPHENHYDRAMINE HCL 50 MG/ML IJ SOLN
25.0000 mg | Freq: Four times a day (QID) | INTRAMUSCULAR | Status: DC | PRN
  Administered 2015-11-26 – 2015-11-28 (×9): 25 mg via INTRAVENOUS
  Filled 2015-11-25 (×9): qty 1

## 2015-11-25 MED ORDER — OXYCODONE-ACETAMINOPHEN 5-325 MG PO TABS
1.0000 | ORAL_TABLET | Freq: Once | ORAL | Status: AC
  Administered 2015-11-25: 1 via ORAL
  Filled 2015-11-25: qty 1

## 2015-11-25 MED ORDER — MAGNESIUM SULFATE 4 GM/100ML IV SOLN
4.0000 g | Freq: Once | INTRAVENOUS | Status: AC
  Administered 2015-11-26: 4 g via INTRAVENOUS
  Filled 2015-11-25: qty 100

## 2015-11-25 MED ORDER — SODIUM CHLORIDE 0.9 % IV SOLN
1000.0000 mg | Freq: Once | INTRAVENOUS | Status: AC
  Administered 2015-11-25: 1000 mg via INTRAVENOUS
  Filled 2015-11-25: qty 5

## 2015-11-25 NOTE — ED Notes (Signed)
Patient transported to CT 

## 2015-11-25 NOTE — ED Notes (Signed)
Pt ambulated to restroom. Pt had a steady gait and tolerated it well.

## 2015-11-25 NOTE — ED Notes (Signed)
Pt states that he had a seizure today and has been continuing to have blood in his stools. States that he has not taken his seizure meds in several days because he had no money for it. Alert and oriented .

## 2015-11-25 NOTE — ED Notes (Signed)
Pt has urinal and aware urine sample is needed

## 2015-11-25 NOTE — ED Notes (Signed)
Provider notified of pt requesting form more pain medications. No additional orders given at this time.

## 2015-11-25 NOTE — ED Notes (Signed)
Assisted patient to restroom and back to stretcher. Tolerated it well. Provided patient juice.

## 2015-11-25 NOTE — ED Provider Notes (Signed)
CSN: [id number]     Arrival date & time 11/25/15  1658 History   First MD Initiated Contact with Patient 11/25/15 1741     Chief Complaint  Patient presents with  . Seizures     (Consider location/radiation/quality/duration/timing/severity/associated sxs/prior Treatment) HPI   Blood pressure 146/100, pulse 84, temperature 97.8 F (36.6 C), temperature source Oral, resp. rate 20, SpO2 100 %.  Joseph Haney is a 40 y.o. male with past medical history significant for alcohol abuse complaining of seizure onset this afternoon. Patient states he been out of his [place] for 3 days because he doesn't have the money. He continues to have 10 bloody, loose stools per day, states this has been going on for proximately one week. A states that he stood up from going to the bathroom, he turned around to look and see if there was blood in his stools and that was lasting he remembered. Thinks he may have had a seizure, there was no incontinence. Patient woke up on the floor, he states he hit the back of his head on the tile floor. He has a headache with low back pain and left hip pain which she rates as 7 out of 10. He denies chest pain, shortness of breath, dyspnea on exertion. Patient was seen for lower GI bleed and has an appointment with [facility] gastro[facility]ology at the end of May. Patient declines any blood products. Patient states he's had multiple episodes of diarrhea over the course of 2 days.    Past Medical History  Diagnosis Date  . Seizure ([place])   . Alcohol abuse   . GERD (gastroesophageal reflux disease)    Past Surgical History  Procedure Laterality Date  . No past surgeries     Family History  Problem Relation Age of Onset  . Coronary artery disease Mother   . Coronary artery disease Father   . Heart attack Father 12   Social History  Substance Use Topics  . Smoking status: Never Smoker   . Smokeless tobacco: Never Used  . Alcohol Use: 0.0 oz/week     Comment: one 40 ounce a  week    Review of Systems  10 systems reviewed and found to be negative, except as noted in the HPI.   Allergies  Morphine and related and Other  Home Medications   Prior to Admission medications   Medication Sig Start Date End Date Taking? Authorizing Provider  carvedilol (COREG) 6.25 MG tablet Take 2 tablets (12.5 mg total) by mouth 2 (two) times daily with a meal. 10/12/15  Yes Debbe Odea, MD  docusate sodium (COLACE) 100 MG capsule Take 100 mg by mouth daily.   Yes Historical Provider, MD  levETIRAcetam (KEPPRA) 500 MG tablet Take 1 tablet (500 mg total) by mouth 2 (two) times daily. 10/12/15  Yes Debbe Odea, MD  methocarbamol (ROBAXIN) 500 MG tablet Take 1 tablet (500 mg total) by mouth 2 (two) times daily. Patient taking differently: Take 500 mg by mouth 2 (two) times daily as needed for muscle spasms.  10/14/15  Yes Domenic Moras, [name]  omeprazole (PRILOSEC) 20 MG capsule Take 1 capsule (20 mg total) by mouth daily. 11/20/15  Yes Varney Biles, MD  ibuprofen (ADVIL,MOTRIN) 800 MG tablet Take 1 tablet (800 mg total) by mouth 3 (three) times daily. Patient taking differently: Take 800 mg by mouth every 8 (eight) hours as needed for headache, mild pain or moderate pain.  10/14/15   Domenic Moras, [name]   BP 146/100 mmHg  Pulse 84  Temp(Src) 97.8 F (36.6 C) (Oral)  Resp 20  SpO2 100% Physical Exam  Constitutional: He is oriented to person, place, and time. He appears well-developed and well-nourished. No distress.  HENT:  Head: Normocephalic and atraumatic.  Mouth/Throat: Oropharynx is clear and moist.  Eyes: Conjunctivae and EOM are normal. Pupils are equal, round, and reactive to light.  Neck: Normal range of motion.  Cardiovascular: Normal rate, regular rhythm and intact distal pulses.   Pulmonary/Chest: Effort normal and breath sounds normal. No stridor.  Abdominal: Soft. There is no tenderness.  Genitourinary:  Rectal exam a chaperoned by technician: No rashes or lesions,  normal rectal tone, normal stool color.  Musculoskeletal: Normal range of motion.  Neurological: He is alert and oriented to person, place, and time.  Skin: He is not diaphoretic.  Psychiatric: He has a normal mood and affect.  Nursing note and vitals reviewed.   ED Course  Procedures (including critical care time) Labs Review Labs Reviewed  CBC WITH DIFFERENTIAL/PLATELET - Abnormal; Notable for the following:    RBC 3.68 (*)    Hemoglobin 12.6 (*)    HCT 34.7 (*)    MCH 34.2 (*)    MCHC 36.3 (*)    All other components within normal limits  COMPREHENSIVE METABOLIC PANEL - Abnormal; Notable for the following:    Potassium 2.5 (*)    Chloride 99 (*)    Glucose, Bld 118 (*)    BUN <5 (*)    Creatinine, Ser 0.59 (*)    Total Protein 9.2 (*)    AST 143 (*)    Total Bilirubin 2.0 (*)    Anion gap 16 (*)    All other components within normal limits  MAGNESIUM - Abnormal; Notable for the following:    Magnesium 1.2 (*)    All other components within normal limits  CBG MONITORING, ED - Abnormal; Notable for the following:    Glucose-Capillary 122 (*)    All other components within normal limits  POC OCCULT BLOOD, ED - Abnormal; Notable for the following:    Fecal Occult Bld POSITIVE (*)    All other components within normal limits  GASTROINTESTINAL PANEL BY PCR, STOOL (REPLACES STOOL CULTURE)  ETHANOL  PROTIME-INR  URINALYSIS, ROUTINE W REFLEX MICROSCOPIC (NOT AT [facility])  LIPASE, BLOOD    Imaging Review Ct Head Wo Contrast  11/25/2015  CLINICAL DATA:  Pt states that he had a seizure today and has been continuing to have blood in his stools. States that he has not taken his seizure meds in several days because he had no money for it. Alert and oriented . EXAM: CT HEAD WITHOUT CONTRAST CT CERVICAL SPINE WITHOUT CONTRAST TECHNIQUE: Multidetector CT imaging of the head and cervical spine was performed following the standard protocol without intravenous contrast. Multiplanar CT image  reconstructions of the cervical spine were also generated. COMPARISON:  10/13/2015 FINDINGS: CT HEAD FINDINGS The ventricles are normal in configuration. There is mild ventricular and sulcal enlargement reflecting volume loss greater than expected for a patient of this age. There are no parenchymal masses or mass effect. There is no evidence of infarct. No extra-axial masses or abnormal fluid collections. There is no intracranial hemorrhage. The visualized sinuses and mastoid air cells are clear. No skull lesion. CT CERVICAL SPINE FINDINGS No fracture. No spondylolisthesis. Minor disc degenerative changes at C4-C5 and C5-C6. No significant stenosis. Soft tissues are unremarkable. Lung apices are clear. IMPRESSION: HEAD CT: No acute intracranial abnormalities. Mild  atrophy advanced for age. CERVICAL CT:  No fracture or acute finding. Electronically Signed   By: Lajean Manes M.D.   On: 11/25/2015 20:08   Ct Cervical Spine Wo Contrast  11/25/2015  CLINICAL DATA:  Pt states that he had a seizure today and has been continuing to have blood in his stools. States that he has not taken his seizure meds in several days because he had no money for it. Alert and oriented . EXAM: CT HEAD WITHOUT CONTRAST CT CERVICAL SPINE WITHOUT CONTRAST TECHNIQUE: Multidetector CT imaging of the head and cervical spine was performed following the standard protocol without intravenous contrast. Multiplanar CT image reconstructions of the cervical spine were also generated. COMPARISON:  10/13/2015 FINDINGS: CT HEAD FINDINGS The ventricles are normal in configuration. There is mild ventricular and sulcal enlargement reflecting volume loss greater than expected for a patient of this age. There are no parenchymal masses or mass effect. There is no evidence of infarct. No extra-axial masses or abnormal fluid collections. There is no intracranial hemorrhage. The visualized sinuses and mastoid air cells are clear. No skull lesion. CT CERVICAL  SPINE FINDINGS No fracture. No spondylolisthesis. Minor disc degenerative changes at C4-C5 and C5-C6. No significant stenosis. Soft tissues are unremarkable. Lung apices are clear. IMPRESSION: HEAD CT: No acute intracranial abnormalities. Mild atrophy advanced for age. CERVICAL CT:  No fracture or acute finding. Electronically Signed   By: Lajean Manes M.D.   On: 11/25/2015 20:08   Dg Hip Unilat With Pelvis 2-3 Views Left  11/25/2015  CLINICAL DATA:  Fall today with left hip pain, initial encounter EXAM: DG HIP (WITH OR WITHOUT PELVIS) 2-3V LEFT COMPARISON:  10/11/2015 FINDINGS: Bilateral degenerative changes of the hip joints are noted. The pelvic ring is intact. No acute fracture or dislocation is noted. No gross soft tissue abnormality is noted. IMPRESSION: Degenerative change without acute abnormality. Electronically Signed   By: Inez Catalina M.D.   On: 11/25/2015 18:13   I have personally reviewed and evaluated these images and lab results as part of my medical decision-making.   EKG Interpretation   Date/Time:  Sunday November 25 2015 17:39:06 EDT Ventricular Rate:  83 PR Interval:  154 QRS Duration: 86 QT Interval:  398 QTC Calculation: 468 R Axis:   62 Text Interpretation:  Sinus rhythm No significant change since last  tracing Confirmed by BEATON  MD, ROBERT ([id number]) on 11/25/2015 9:10:02 PM      MDM   Final diagnoses:  Hypokalemia  Hypomagnesemia  Diarrhea, unspecified type  Seizure ([place])  Medically noncompliant    Filed Vitals:   11/25/15 1834 11/25/15 1843 11/25/15 1917 11/25/15 1935  BP: 143/97  127/86 146/98  Pulse: 92 93 110 106  Temp:      TempSrc:      Resp: 9 21 18 23   SpO2: 100% 100% 100% 100%    Medications  levETIRAcetam (KEPPRA) 1,000 mg in sodium chloride 0.9 % 100 mL IVPB (0 mg Intravenous Stopped 11/25/15 1922)  oxyCODONE-acetaminophen (PERCOCET/ROXICET) 5-325 MG per tablet 1 tablet (1 tablet Oral Given 11/25/15 1917)  famotidine (PEPCID) IVPB 20 mg  premix (0 mg Intravenous Stopped 11/25/15 2007)  magnesium oxide (MAG-OX) tablet 400 mg (400 mg Oral Given 11/25/15 2007)  potassium chloride SA (K-DUR,KLOR-CON) CR tablet 40 mEq (40 mEq Oral Given 11/25/15 2005)    Corvin Bogus is 40 y.o. male presenting with Lower GI bleed and seizure versus syncope. With history of alcohol abuse, he also has seizure disorder with  Keppra, states he has not had his antiseizure medication for 3 days. Endorses head trauma with headache. This patient is alcohol will CT head and neck.  Potassium 2.5. EKG without acute changes. Patient placed on a monitor, will check magnesium and supplement mag and potassium orally. Patient is loaded with Keppra IV.  Guaiac positive, patient found to be hypokalemic at 2.5, no EKG changes. Hypomagnesemic at 1.2. Patient will be repleted, CT head, cervical spine and x-ray of hip are negative. Case discussed with Dr. Olevia Bowens who accepts admission    [facility], [name] 11/25/15 2320  Leonard Schwartz, MD 11/25/15 2324

## 2015-11-25 NOTE — ED Notes (Signed)
K 2.5, report given to [place], [place] and [place]

## 2015-11-26 ENCOUNTER — Encounter (HOSPITAL_COMMUNITY): Payer: Self-pay

## 2015-11-26 DIAGNOSIS — E876 Hypokalemia: Secondary | ICD-10-CM | POA: Diagnosis not present

## 2015-11-26 DIAGNOSIS — Z885 Allergy status to narcotic agent status: Secondary | ICD-10-CM | POA: Diagnosis not present

## 2015-11-26 DIAGNOSIS — Z6820 Body mass index (BMI) 20.0-20.9, adult: Secondary | ICD-10-CM | POA: Diagnosis not present

## 2015-11-26 DIAGNOSIS — I10 Essential (primary) hypertension: Secondary | ICD-10-CM | POA: Diagnosis present

## 2015-11-26 DIAGNOSIS — G40909 Epilepsy, unspecified, not intractable, without status epilepticus: Secondary | ICD-10-CM | POA: Diagnosis present

## 2015-11-26 DIAGNOSIS — K529 Noninfective gastroenteritis and colitis, unspecified: Secondary | ICD-10-CM | POA: Diagnosis present

## 2015-11-26 DIAGNOSIS — R319 Hematuria, unspecified: Secondary | ICD-10-CM | POA: Diagnosis present

## 2015-11-26 DIAGNOSIS — K219 Gastro-esophageal reflux disease without esophagitis: Secondary | ICD-10-CM | POA: Diagnosis present

## 2015-11-26 DIAGNOSIS — Z79899 Other long term (current) drug therapy: Secondary | ICD-10-CM | POA: Diagnosis not present

## 2015-11-26 DIAGNOSIS — D649 Anemia, unspecified: Secondary | ICD-10-CM | POA: Diagnosis present

## 2015-11-26 DIAGNOSIS — R740 Nonspecific elevation of levels of transaminase and lactic acid dehydrogenase [LDH]: Secondary | ICD-10-CM | POA: Diagnosis present

## 2015-11-26 DIAGNOSIS — E43 Unspecified severe protein-calorie malnutrition: Secondary | ICD-10-CM | POA: Diagnosis present

## 2015-11-26 DIAGNOSIS — Z9119 Patient's noncompliance with other medical treatment and regimen: Secondary | ICD-10-CM | POA: Diagnosis not present

## 2015-11-26 DIAGNOSIS — F1010 Alcohol abuse, uncomplicated: Secondary | ICD-10-CM | POA: Diagnosis not present

## 2015-11-26 DIAGNOSIS — N390 Urinary tract infection, site not specified: Secondary | ICD-10-CM | POA: Insufficient documentation

## 2015-11-26 DIAGNOSIS — Z8249 Family history of ischemic heart disease and other diseases of the circulatory system: Secondary | ICD-10-CM | POA: Diagnosis not present

## 2015-11-26 DIAGNOSIS — R197 Diarrhea, unspecified: Secondary | ICD-10-CM

## 2015-11-26 DIAGNOSIS — A084 Viral intestinal infection, unspecified: Secondary | ICD-10-CM | POA: Diagnosis present

## 2015-11-26 DIAGNOSIS — R300 Dysuria: Secondary | ICD-10-CM | POA: Diagnosis present

## 2015-11-26 DIAGNOSIS — K625 Hemorrhage of anus and rectum: Secondary | ICD-10-CM | POA: Diagnosis present

## 2015-11-26 LAB — GASTROINTESTINAL PANEL BY PCR, STOOL (REPLACES STOOL CULTURE)
ASTROVIRUS: NOT DETECTED
Adenovirus F40/41: NOT DETECTED
Campylobacter species: NOT DETECTED
Cryptosporidium: NOT DETECTED
Cyclospora cayetanensis: NOT DETECTED
E. COLI O157: NOT DETECTED
ENTEROPATHOGENIC E COLI (EPEC): NOT DETECTED
ENTEROTOXIGENIC E COLI (ETEC): NOT DETECTED
Entamoeba histolytica: NOT DETECTED
Enteroaggregative E coli (EAEC): NOT DETECTED
Giardia lamblia: NOT DETECTED
NOROVIRUS GI/GII: NOT DETECTED
Plesimonas shigelloides: NOT DETECTED
ROTAVIRUS A: NOT DETECTED
SAPOVIRUS (I, II, IV, AND V): NOT DETECTED
SHIGA LIKE TOXIN PRODUCING E COLI (STEC): NOT DETECTED
Salmonella species: NOT DETECTED
Shigella/Enteroinvasive E coli (EIEC): NOT DETECTED
VIBRIO CHOLERAE: NOT DETECTED
Vibrio species: NOT DETECTED
Yersinia enterocolitica: NOT DETECTED

## 2015-11-26 LAB — CBC WITH DIFFERENTIAL/PLATELET
Basophils Absolute: 0 10*3/uL (ref 0.0–0.1)
Basophils Relative: 1 %
EOS ABS: 0 10*3/uL (ref 0.0–0.7)
EOS PCT: 1 %
HCT: 30.2 % — ABNORMAL LOW (ref 39.0–52.0)
HEMOGLOBIN: 10.8 g/dL — AB (ref 13.0–17.0)
LYMPHS ABS: 1.4 10*3/uL (ref 0.7–4.0)
LYMPHS PCT: 29 %
MCH: 34.6 pg — AB (ref 26.0–34.0)
MCHC: 35.8 g/dL (ref 30.0–36.0)
MCV: 96.8 fL (ref 78.0–100.0)
MONOS PCT: 9 %
Monocytes Absolute: 0.4 10*3/uL (ref 0.1–1.0)
NEUTROS PCT: 60 %
Neutro Abs: 3 10*3/uL (ref 1.7–7.7)
Platelets: 181 10*3/uL (ref 150–400)
RBC: 3.12 MIL/uL — ABNORMAL LOW (ref 4.22–5.81)
RDW: 14.3 % (ref 11.5–15.5)
WBC: 5 10*3/uL (ref 4.0–10.5)

## 2015-11-26 LAB — COMPREHENSIVE METABOLIC PANEL
ALK PHOS: 89 U/L (ref 38–126)
ALT: 20 U/L (ref 17–63)
AST: 84 U/L — AB (ref 15–41)
Albumin: 3.3 g/dL — ABNORMAL LOW (ref 3.5–5.0)
Anion gap: 11 (ref 5–15)
CALCIUM: 7.7 mg/dL — AB (ref 8.9–10.3)
CHLORIDE: 104 mmol/L (ref 101–111)
CO2: 24 mmol/L (ref 22–32)
CREATININE: 0.55 mg/dL — AB (ref 0.61–1.24)
GFR calc Af Amer: >60 mL/min (ref >60)
GFR calc non Af Amer: >60 mL/min (ref >60)
GLUCOSE: 97 mg/dL (ref 65–99)
Potassium: 2.6 mmol/L — CL (ref 3.5–5.1)
SODIUM: 139 mmol/L (ref 135–145)
Total Bilirubin: 1.5 mg/dL — ABNORMAL HIGH (ref 0.3–1.2)
Total Protein: 7 g/dL (ref 6.5–8.1)

## 2015-11-26 LAB — PHOSPHORUS
PHOSPHORUS: 2.4 mg/dL — AB (ref 2.5–4.6)
Phosphorus: 2.4 mg/dL — ABNORMAL LOW (ref 2.5–4.6)

## 2015-11-26 LAB — LIPASE, BLOOD: LIPASE: 35 U/L (ref 11–51)

## 2015-11-26 MED ORDER — SODIUM CHLORIDE 0.9% FLUSH
3.0000 mL | Freq: Two times a day (BID) | INTRAVENOUS | Status: DC
  Administered 2015-11-26 – 2015-11-29 (×4): 3 mL via INTRAVENOUS

## 2015-11-26 MED ORDER — ADULT MULTIVITAMIN W/MINERALS CH
1.0000 | ORAL_TABLET | Freq: Every day | ORAL | Status: DC
  Administered 2015-11-26 – 2015-11-30 (×5): 1 via ORAL
  Filled 2015-11-26 (×5): qty 1

## 2015-11-26 MED ORDER — LEVETIRACETAM 500 MG PO TABS
500.0000 mg | ORAL_TABLET | Freq: Two times a day (BID) | ORAL | Status: DC
  Administered 2015-11-26 – 2015-11-30 (×10): 500 mg via ORAL
  Filled 2015-11-26 (×11): qty 1

## 2015-11-26 MED ORDER — ENSURE ENLIVE PO LIQD
237.0000 mL | Freq: Two times a day (BID) | ORAL | Status: DC
  Administered 2015-11-26: 237 mL via ORAL

## 2015-11-26 MED ORDER — CARVEDILOL 12.5 MG PO TABS
12.5000 mg | ORAL_TABLET | Freq: Two times a day (BID) | ORAL | Status: DC
  Administered 2015-11-26 – 2015-11-30 (×11): 12.5 mg via ORAL
  Filled 2015-11-26 (×11): qty 1

## 2015-11-26 MED ORDER — VITAMIN B-1 100 MG PO TABS
100.0000 mg | ORAL_TABLET | Freq: Every day | ORAL | Status: DC
  Administered 2015-11-26 – 2015-11-30 (×5): 100 mg via ORAL
  Filled 2015-11-26 (×5): qty 1

## 2015-11-26 MED ORDER — POTASSIUM PHOSPHATES 15 MMOLE/5ML IV SOLN
20.0000 meq | Freq: Once | INTRAVENOUS | Status: AC
  Administered 2015-11-26: 20 meq via INTRAVENOUS
  Filled 2015-11-26: qty 4.55

## 2015-11-26 MED ORDER — METRONIDAZOLE IN NACL 5-0.79 MG/ML-% IV SOLN
500.0000 mg | Freq: Three times a day (TID) | INTRAVENOUS | Status: DC
  Administered 2015-11-26 – 2015-11-27 (×5): 500 mg via INTRAVENOUS
  Filled 2015-11-26 (×6): qty 100

## 2015-11-26 MED ORDER — POTASSIUM CHLORIDE 20 MEQ/15ML (10%) PO SOLN
40.0000 meq | Freq: Once | ORAL | Status: AC
  Administered 2015-11-26: 40 meq via ORAL
  Filled 2015-11-26: qty 30

## 2015-11-26 MED ORDER — LORAZEPAM 2 MG/ML IJ SOLN
1.0000 mg | Freq: Four times a day (QID) | INTRAMUSCULAR | Status: AC | PRN
  Administered 2015-11-26 (×2): 1 mg via INTRAVENOUS
  Filled 2015-11-26 (×2): qty 1

## 2015-11-26 MED ORDER — POTASSIUM CHLORIDE IN NACL 40-0.9 MEQ/L-% IV SOLN
INTRAVENOUS | Status: DC
  Administered 2015-11-26 – 2015-11-28 (×6): 125 mL/h via INTRAVENOUS
  Filled 2015-11-26 (×8): qty 1000

## 2015-11-26 MED ORDER — LORAZEPAM 1 MG PO TABS
1.0000 mg | ORAL_TABLET | Freq: Four times a day (QID) | ORAL | Status: AC | PRN
  Administered 2015-11-28 (×2): 1 mg via ORAL
  Filled 2015-11-26 (×2): qty 1

## 2015-11-26 MED ORDER — HYDROXYZINE HCL 25 MG PO TABS
25.0000 mg | ORAL_TABLET | Freq: Once | ORAL | Status: AC
  Administered 2015-11-26: 25 mg via ORAL
  Filled 2015-11-26: qty 1

## 2015-11-26 MED ORDER — DEXTROSE 5 % IV SOLN
1.0000 g | INTRAVENOUS | Status: DC
  Administered 2015-11-26 – 2015-11-27 (×2): 1 g via INTRAVENOUS
  Filled 2015-11-26 (×3): qty 10

## 2015-11-26 MED ORDER — POTASSIUM CHLORIDE CRYS ER 20 MEQ PO TBCR
40.0000 meq | EXTENDED_RELEASE_TABLET | ORAL | Status: AC
  Administered 2015-11-26 (×3): 40 meq via ORAL
  Filled 2015-11-26 (×3): qty 2

## 2015-11-26 MED ORDER — K PHOS MONO-SOD PHOS DI & MONO 155-852-130 MG PO TABS
250.0000 mg | ORAL_TABLET | Freq: Once | ORAL | Status: AC
  Administered 2015-11-26: 250 mg via ORAL
  Filled 2015-11-26: qty 1

## 2015-11-26 MED ORDER — FOLIC ACID 1 MG PO TABS
1.0000 mg | ORAL_TABLET | Freq: Every day | ORAL | Status: DC
  Administered 2015-11-26 – 2015-11-30 (×5): 1 mg via ORAL
  Filled 2015-11-26 (×5): qty 1

## 2015-11-26 MED ORDER — THIAMINE HCL 100 MG/ML IJ SOLN
100.0000 mg | Freq: Every day | INTRAMUSCULAR | Status: DC
  Filled 2015-11-26 (×5): qty 1

## 2015-11-26 MED ORDER — POTASSIUM CHLORIDE 20 MEQ/15ML (10%) PO SOLN
40.0000 meq | Freq: Once | ORAL | Status: AC
  Administered 2015-11-26: 40 meq via ORAL

## 2015-11-26 MED ORDER — POTASSIUM CHLORIDE CRYS ER 20 MEQ PO TBCR: 40.0000 meq | EXTENDED_RELEASE_TABLET | Freq: Two times a day (BID) | ORAL | Status: DC

## 2015-11-26 NOTE — Plan of Care (Signed)
Problem: Safety: Goal: Ability to remain free from injury will improve Outcome: Completed/Met Date Met:  11/26/15 Education provided regarding safety precautions and risk for falling d/t medications and previous seizure activity

## 2015-11-26 NOTE — H&P (Signed)
[facility] History and Physical  Joseph Haney [id number] DOB: [birth year] DOA: 11/25/2015  Referring physician: Monico Blitz, [name] PCP: Shona Needles [facility]   Chief Complaint: Seizure.  HPI: Joseph Haney is a 40 y.o. male with a past medical history of seizure disorder, alcohol abuse, GERD who is coming to the emergency department due to having a seizure, persistent diarrhea and occasional hematochezia.  Per patient, he was in the bathroom, when he passed out. He doesn't know exactly how long he was out, but he felt like he had a seizure. He has also been having persistent diarrhea for the past 2 weeks. He denies fever, but complains of mild diffuse abdominal pain, nausea, but no emesis, hematochezia but no melena.  When seen in the emergency department, the patient was in no acute distress. Workup shows mild anemia, hypokalemia and hypomagnesemia.   Review of Systems:  Constitutional: Positive fatigue. No weight loss, night sweats, Fevers, chills  HEENT:  No headaches, Difficulty swallowing,Tooth/dental problems,Sore throat,  No sneezing, itching, ear ache, nasal congestion, post nasal drip,  Cardio-vascular:  No chest pain, Orthopnea, PND, swelling in lower extremities, anasarca, dizziness, palpitations  GI:  Positive hematochezia, diarrhea, abdominal pain, nausea, loss of appetite  No heartburn, indigestion, vomiting, change in bowel habits.  Resp:  No shortness of breath with exertion or at rest. No excess mucus, no productive cough, No non-productive cough, No coughing up of blood.No change in color of mucus.No wheezing.No chest wall deformity  Skin:  no rash or lesions.  GU:  no dysuria, change in color of urine, no urgency or frequency. No flank pain.  Musculoskeletal:  No joint pain or swelling. No decreased range of motion. No back pain.  Psych:  No change in mood or affect. No depression or anxiety. No memory loss.   Past Medical History   Diagnosis Date  . Seizure ([place])   . Alcohol abuse   . GERD (gastroesophageal reflux disease)    Past Surgical History  Procedure Laterality Date  . No past surgeries     Social History:  reports that he has never smoked. He has never used smokeless tobacco. He reports that he drinks alcohol. He reports that he does not use illicit drugs.  Allergies  Allergen Reactions  . Morphine And Related Itching  . Other     Pt refuses blood products. 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 Coronary artery disease Mother   . Coronary artery disease Father   . Heart attack Father 41      Prior to Admission medications   Medication Sig Start Date End Date Taking? Authorizing Provider  carvedilol (COREG) 6.25 MG tablet Take 2 tablets (12.5 mg total) by mouth 2 (two) times daily with a meal. 10/12/15  Yes Debbe Odea, MD  docusate sodium (COLACE) 100 MG capsule Take 100 mg by mouth daily.   Yes Historical Provider, MD  levETIRAcetam (KEPPRA) 500 MG tablet Take 1 tablet (500 mg total) by mouth 2 (two) times daily. 10/12/15  Yes Debbe Odea, MD  methocarbamol (ROBAXIN) 500 MG tablet Take 1 tablet (500 mg total) by mouth 2 (two) times daily. Patient taking differently: Take 500 mg by mouth 2 (two) times daily as needed for muscle spasms.  10/14/15  Yes Domenic Moras, [name]  omeprazole (PRILOSEC) 20 MG capsule Take 1 capsule (20 mg total) by mouth daily. 11/20/15  Yes Varney Biles, MD  ibuprofen (ADVIL,MOTRIN) 800 MG  tablet Take 1 tablet (800 mg total) by mouth 3 (three) times daily. Patient taking differently: Take 800 mg by mouth every 8 (eight) hours as needed for headache, mild pain or moderate pain.  10/14/15   Domenic Moras, [name]   Physical Exam: Filed Vitals:   11/25/15 2345 11/26/15 0000 11/26/15 0025 11/26/15 0029  BP: 136/98 133/91  147/89  Pulse: 94 101  102  Temp:    98.2 F (36.8 C)  TempSrc:    Oral  Resp: 13 20  20   Height:   5\' 6"  (1.676 m)    Weight:   58.6 kg (129 lb 3 oz)   SpO2: 100% 100%  100%    Wt Readings from Last 3 Encounters:  11/26/15 58.6 kg (129 lb 3 oz)  11/19/15 62.596 kg (138 lb)  10/13/15 62.596 kg (138 lb)    General:  Appears calm and comfortable Eyes: PERRL, normal lids, irises & conjunctiva ENT: grossly normal hearing, lips & tongue Neck: no LAD, masses or thyromegaly Cardiovascular: RRR, no m/r/g. No LE edema. Telemetry: SR, no arrhythmias  Respiratory: CTA bilaterally, no w/r/r. Normal respiratory effort. Abdomen: Bowel sounds hyperactive, soft, mild diffuse tenderness, no guarding, no rebound.  Skin: no rash or induration seen on limited exam Musculoskeletal: grossly normal tone BUE/BLE Psychiatric: grossly normal mood and affect, speech fluent and appropriate Neurologic: grossly non-focal.          Labs on Admission:  Basic Metabolic Panel:  Recent Labs Lab 11/19/15 2141 11/25/15 1857  NA 145 139  K 2.5* 2.5*  CL 105 99*  CO2 23 24  GLUCOSE 118* 118*  BUN 7 <5*  CREATININE 0.61 0.59*  CALCIUM 8.6* 9.0  MG  --  1.2*  PHOS  --  2.4*   Liver Function Tests:  Recent Labs Lab 11/19/15 2141 11/25/15 1857  AST 137* 143*  ALT 28 28  ALKPHOS 118 111  BILITOT 1.0 2.0*  PROT 8.1 9.2*  ALBUMIN 4.1 4.6    Recent Labs Lab 11/25/15 1857  LIPASE 35   CBC:  Recent Labs Lab 11/19/15 2141 11/20/15 0103 11/25/15 1857  WBC 5.0 5.3 6.6  NEUTROABS  --   --  5.1  HGB 12.0* 11.8* 12.6*  HCT 33.9* 33.0* 34.7*  MCV 94.7 94.3 94.3  PLT 248 250 232    CBG:  Recent Labs Lab 11/25/15 1711  GLUCAP 122*    Radiological Exams on Admission: Ct Head Wo Contrast  11/25/2015  CLINICAL DATA:  Pt states that he had a seizure today and has been continuing to have blood in his stools. States that he has not taken his seizure meds in several days because he had no money for it. Alert and oriented . EXAM: CT HEAD WITHOUT CONTRAST CT CERVICAL SPINE WITHOUT CONTRAST TECHNIQUE:  Multidetector CT imaging of the head and cervical spine was performed following the standard protocol without intravenous contrast. Multiplanar CT image reconstructions of the cervical spine were also generated. COMPARISON:  10/13/2015 FINDINGS: CT HEAD FINDINGS The ventricles are normal in configuration. There is mild ventricular and sulcal enlargement reflecting volume loss greater than expected for a patient of this age. There are no parenchymal masses or mass effect. There is no evidence of infarct. No extra-axial masses or abnormal fluid collections. There is no intracranial hemorrhage. The visualized sinuses and mastoid air cells are clear. No skull lesion. CT CERVICAL SPINE FINDINGS No fracture. No spondylolisthesis. Minor disc degenerative changes at C4-C5 and C5-C6. No significant stenosis. Soft  tissues are unremarkable. Lung apices are clear. IMPRESSION: HEAD CT: No acute intracranial abnormalities. Mild atrophy advanced for age. CERVICAL CT:  No fracture or acute finding. Electronically Signed   By: Lajean Manes M.D.   On: 11/25/2015 20:08   Ct Cervical Spine Wo Contrast  11/25/2015  CLINICAL DATA:  Pt states that he had a seizure today and has been continuing to have blood in his stools. States that he has not taken his seizure meds in several days because he had no money for it. Alert and oriented . EXAM: CT HEAD WITHOUT CONTRAST CT CERVICAL SPINE WITHOUT CONTRAST TECHNIQUE: Multidetector CT imaging of the head and cervical spine was performed following the standard protocol without intravenous contrast. Multiplanar CT image reconstructions of the cervical spine were also generated. COMPARISON:  10/13/2015 FINDINGS: CT HEAD FINDINGS The ventricles are normal in configuration. There is mild ventricular and sulcal enlargement reflecting volume loss greater than expected for a patient of this age. There are no parenchymal masses or mass effect. There is no evidence of infarct. No extra-axial masses or  abnormal fluid collections. There is no intracranial hemorrhage. The visualized sinuses and mastoid air cells are clear. No skull lesion. CT CERVICAL SPINE FINDINGS No fracture. No spondylolisthesis. Minor disc degenerative changes at C4-C5 and C5-C6. No significant stenosis. Soft tissues are unremarkable. Lung apices are clear. IMPRESSION: HEAD CT: No acute intracranial abnormalities. Mild atrophy advanced for age. CERVICAL CT:  No fracture or acute finding. Electronically Signed   By: Lajean Manes M.D.   On: 11/25/2015 20:08   Dg Hip Unilat With Pelvis 2-3 Views Left  11/25/2015  CLINICAL DATA:  Fall today with left hip pain, initial encounter EXAM: DG HIP (WITH OR WITHOUT PELVIS) 2-3V LEFT COMPARISON:  10/11/2015 FINDINGS: Bilateral degenerative changes of the hip joints are noted. The pelvic ring is intact. No acute fracture or dislocation is noted. No gross soft tissue abnormality is noted. IMPRESSION: Degenerative change without acute abnormality. Electronically Signed   By: Inez Catalina M.D.   On: 11/25/2015 18:13    EKG: Independently reviewed. Vent. rate 83 BPM PR interval 154 ms QRS duration 86 ms QT/QTc 398/468 ms P-R-T axes 67 62 67 Sinus rhythm No significant change since last tracing  Assessment/Plan Principal Problem:   Enteritis Admit to telemetry/observation. Gastrointestinal pathogens by PCR is pending. Replace electrolytes. Continue IV hydration.  Active Problems:   Alcohol abuse Per patient, he has not had anything to drink in the last 4 days. He is states that he has been decreasing the amount he drinks. However, his mother stated that she believes that he is drinking more than what he is stating. There are no signs of withdrawals at this time, but we will order CiWA    Seizure disorder ([place]) Loaded with IV Keppra. Continue Keppra 500 mg by mouth twice a day.    Hypokalemia Continue potassium replacement. Follow-up level in the morning.     Hypomagnesemia Continue replacement.    Rectal bleeding This is a chronic problem, the patient has an appointment with GI in May.    Code Status: Full code. DVT Prophylaxis: SCDs. Family Communication: Smoker was present in the room. Disposition Plan: Admit to telemetry for electrolyte replacement and alcohol detox.  Time spent: Over 70 minutes were used in the process of this admission.  Reubin Milan, M.D. [facility] Pager [phone].

## 2015-11-26 NOTE — Care Management Note (Signed)
Case Management Note  Patient Details  Name: Calvert Polson MRN: [id number] Date of Birth: 1975/12/31  Subjective/Objective: 40 y/o m admitted w/enteritis. From home. Has pcp, pharmacy-[facility]-co pay $3-6(informed patient of his responsibility-he had no concerns-there is no med assist available-this is a Hospital doctor), has own transp home.No further d/c needs.                    Action/Plan:d/c home.   Expected Discharge Date:                  Expected Discharge Plan:  Home/Self Care  In-House Referral:     Discharge planning Services  CM Consult  Post Acute Care Choice:    Choice offered to:     DME Arranged:    DME Agency:     HH Arranged:    HH Agency:     Status of Service:  In process, will continue to follow  Medicare Important Message Given:    Date Medicare IM Given:    Medicare IM give by:    Date Additional Medicare IM Given:    Additional Medicare Important Message give by:     If discussed at [place] of Stay Meetings, dates discussed:    Additional Comments:  Dessa Phi, RN 11/26/2015, 10:04 AM

## 2015-11-26 NOTE — Progress Notes (Signed)
Patient seen and examined. Admitted after midnight secondary to presumed seizure after been off of AED for 3 days; ongoing diarrhea for almost 2 weeks intermittently and abd pain/nausea. Work up suggesting enteritis and found to have also UTI and electrolytes abnormalities. During my exam he endorses some dysuria and hematuria at home. No fever, normal WBC's. Patient expressed running out of meds while waiting on his check to buy prescriptions. Per records, concerns of heavy alcohol consumption. Please referred to H&P written by Dr. Olevia Bowens for further info/details on admission.  Plan: -continue IV fluids -resume AED -replete electrolytes -continue empiric abx's with flagyl and rocephin -follow stool cx's reports  -PRN antiemetics and supportive care  Joseph Haney, Joseph Haney [id number]

## 2015-11-26 NOTE — Progress Notes (Signed)
CRITICAL VALUE ALERT  Critical value received:  K 2.6  Date of notification:  11/26/15  Time of notification:  0648  Critical value read back:Yes.    Nurse who received alert:  Virgina Norfolk, RN  MD notified (1st page): [place]  Time of first page:  [phone]  MD notified (2nd page):   Time of second page:  Responding MD:  [place]orr  Time MD responded:  [phone]

## 2015-11-27 LAB — COMPREHENSIVE METABOLIC PANEL
ALBUMIN: 3.2 g/dL — AB (ref 3.5–5.0)
ALK PHOS: 73 U/L (ref 38–126)
ALT: 19 U/L (ref 17–63)
ANION GAP: 6 (ref 5–15)
AST: 81 U/L — ABNORMAL HIGH (ref 15–41)
BUN: <5 mg/dL — ABNORMAL LOW (ref 6–20)
CALCIUM: 8.1 mg/dL — AB (ref 8.9–10.3)
CO2: 22 mmol/L (ref 22–32)
Chloride: 114 mmol/L — ABNORMAL HIGH (ref 101–111)
Creatinine, Ser: 0.57 mg/dL — ABNORMAL LOW (ref 0.61–1.24)
GFR calc non Af Amer: >60 mL/min (ref >60)
GLUCOSE: 90 mg/dL (ref 65–99)
POTASSIUM: 4.4 mmol/L (ref 3.5–5.1)
SODIUM: 142 mmol/L (ref 135–145)
Total Bilirubin: 0.8 mg/dL (ref 0.3–1.2)
Total Protein: 6.5 g/dL (ref 6.5–8.1)

## 2015-11-27 LAB — C DIFFICILE QUICK SCREEN W PCR REFLEX
C DIFFICILE (CDIFF) INTERP: NEGATIVE
C DIFFICILE (CDIFF) TOXIN: NEGATIVE
C Diff antigen: NEGATIVE

## 2015-11-27 LAB — CBC WITH DIFFERENTIAL/PLATELET
Basophils Absolute: 0 10*3/uL (ref 0.0–0.1)
Basophils Relative: 0 %
EOS ABS: 0.1 10*3/uL (ref 0.0–0.7)
Eosinophils Relative: 2 %
HCT: 28.7 % — ABNORMAL LOW (ref 39.0–52.0)
HEMOGLOBIN: 9.9 g/dL — AB (ref 13.0–17.0)
LYMPHS ABS: 1 10*3/uL (ref 0.7–4.0)
LYMPHS PCT: 24 %
MCH: 34.7 pg — AB (ref 26.0–34.0)
MCHC: 34.5 g/dL (ref 30.0–36.0)
MCV: 100.7 fL — AB (ref 78.0–100.0)
MONOS PCT: 10 %
Monocytes Absolute: 0.4 10*3/uL (ref 0.1–1.0)
NEUTROS PCT: 64 %
Neutro Abs: 2.6 10*3/uL (ref 1.7–7.7)
Platelets: 183 10*3/uL (ref 150–400)
RBC: 2.85 MIL/uL — ABNORMAL LOW (ref 4.22–5.81)
RDW: 14.9 % (ref 11.5–15.5)
WBC: 4.1 10*3/uL (ref 4.0–10.5)

## 2015-11-27 MED ORDER — BOOST / RESOURCE BREEZE PO LIQD
1.0000 | Freq: Three times a day (TID) | ORAL | Status: DC
Start: 2015-11-27 — End: 2015-11-30
  Administered 2015-11-27 – 2015-11-30 (×9): 1 via ORAL

## 2015-11-27 MED ORDER — SACCHAROMYCES BOULARDII 250 MG PO CAPS
250.0000 mg | ORAL_CAPSULE | Freq: Two times a day (BID) | ORAL | Status: DC
  Administered 2015-11-27 – 2015-11-30 (×6): 250 mg via ORAL
  Filled 2015-11-27 (×7): qty 1

## 2015-11-27 MED ORDER — LOPERAMIDE HCL 2 MG PO CAPS
2.0000 mg | ORAL_CAPSULE | ORAL | Status: DC | PRN
  Administered 2015-11-27 – 2015-11-29 (×12): 2 mg via ORAL
  Filled 2015-11-27 (×12): qty 1

## 2015-11-27 NOTE — Progress Notes (Addendum)
[facility] PROGRESS NOTE  Joseph Haney [id number] DOB: 02-21-1976 DOA: 11/25/2015 PCP: Shona Needles [facility]  Assessment/Plan: 1-nausea/vomiting and diarrhea: appears to be secondary to viral gastroenteritis -C. Diff neg and neg GI pathogen panel -will start loperamide PRN -discontinue flagyl -advance diet and continue PRN antiemetics -plan is for discharge in 24-48 hours base on symptoms improvement   2-HTN:  -well controlled/stable -continue current antihypertensive regimen  3-UTI: -empirically on rocephin -no cx's seen in epic -patient with dysuria, hematuria and frequency prior to admission  -sx's improving -if tolerated PO's well, transition to ceftin BID and treat for 7 days  4-severe protein calorie malnutrition -will follow feeding supplements as recommended by nutritional service   5-hypokalemia/hypomagnesemia  -due to GI loses -will replete as needed   6-alcohol abuse/transaminitis -LFT's trending with hydration and no alcohol -cessation counseling provided -patient will continue to be on CIWA  7-seizure:  -with non compliance prior to admission due to lack of financial power -continue keppra -no seizure appreciated   Code Status: Full Family Communication: no family at bedside Disposition Plan: remains inpatient, continue current antibiotics for another 24 hours; continue IVF's and electrolytes supplementation. Hopefully home in 1-2 days.   Consultants:  None   Procedures:  See below for x-ray reports   Antibiotics:  Rocephin and flagyl: 3/27  HPI/Subjective: Afebrile, no CP, no SOB. Still with ongoing diarrhea and decrease appetite. deneis dysuria, but endorses increase frequency.  Objective: Filed Vitals:   11/27/15 0459 11/27/15 1502  BP: 112/78 131/83  Pulse: 80 95  Temp: 98 F (36.7 C) 98.3 F (36.8 C)  Resp: 16 18    Intake/Output Summary (Last 24 hours) at 11/27/15 1717 Last data filed at 11/27/15 1500  Gross  per 24 hour  Intake 3222.91 ml  Output    606 ml  Net 2616.91 ml   Filed Weights   11/26/15 0025  Weight: 58.6 kg (129 lb 3 oz)    Exam:   General:  Afebrile, feeling slightly better, no CP and no SOB. Patient reports > 5 episodes of diarrhea and some decrease appetite. No abd pain, no nausea or vomiting today.  Cardiovascular:  S1 and S2, no rubs or gallops  Respiratory: CTA bilaterally, good air movement  Abdomen: soft, NT, ND, positive BS  Musculoskeletal: no edema or cyanosis   Data Reviewed: Basic Metabolic Panel:  Recent Labs Lab 11/25/15 1857 11/26/15 0541 11/27/15 0517  NA 139 139 142  K 2.5* 2.6* 4.4  CL 99* 104 114*  CO2 24 24 22   GLUCOSE 118* 97 90  BUN <5* <5* <5*  CREATININE 0.59* 0.55* 0.57*  CALCIUM 9.0 7.7* 8.1*  MG 1.2*  --   --   PHOS 2.4* 2.4*  --    Liver Function Tests:  Recent Labs Lab 11/25/15 1857 11/26/15 0541 11/27/15 0517  AST 143* 84* 81*  ALT 28 20 19   ALKPHOS 111 89 73  BILITOT 2.0* 1.5* 0.8  PROT 9.2* 7.0 6.5  ALBUMIN 4.6 3.3* 3.2*    Recent Labs Lab 11/25/15 1857  LIPASE 35   CBC:  Recent Labs Lab 11/25/15 1857 11/26/15 0541 11/27/15 0517  WBC 6.6 5.0 4.1  NEUTROABS 5.1 3.0 2.6  HGB 12.6* 10.8* 9.9*  HCT 34.7* 30.2* 28.7*  MCV 94.3 96.8 100.7*  PLT 232 181 183   CBG:  Recent Labs Lab 11/25/15 1711  GLUCAP 122*    Recent Results (from the past 240 hour(s))  Gastrointestinal Panel by PCR , Stool  Status: None   Collection Time: 11/25/15 10:41 PM  Result Value Ref Range Status   Campylobacter species NOT DETECTED NOT DETECTED Final   Plesimonas shigelloides NOT DETECTED NOT DETECTED Final   Salmonella species NOT DETECTED NOT DETECTED Final   Yersinia enterocolitica NOT DETECTED NOT DETECTED Final   Vibrio species NOT DETECTED NOT DETECTED Final   Vibrio cholerae NOT DETECTED NOT DETECTED Final   Enteroaggregative E coli (EAEC) NOT DETECTED NOT DETECTED Final   Enteropathogenic E coli (EPEC)  NOT DETECTED NOT DETECTED Final   Enterotoxigenic E coli (ETEC) NOT DETECTED NOT DETECTED Final   Shiga like toxin producing E coli (STEC) NOT DETECTED NOT DETECTED Final   E. coli O157 NOT DETECTED NOT DETECTED Final   Shigella/Enteroinvasive E coli (EIEC) NOT DETECTED NOT DETECTED Final   Cryptosporidium NOT DETECTED NOT DETECTED Final   Cyclospora cayetanensis NOT DETECTED NOT DETECTED Final   Entamoeba histolytica NOT DETECTED NOT DETECTED Final   Giardia lamblia NOT DETECTED NOT DETECTED Final   Adenovirus F40/41 NOT DETECTED NOT DETECTED Final   Astrovirus NOT DETECTED NOT DETECTED Final   Norovirus GI/GII NOT DETECTED NOT DETECTED Final   Rotavirus A NOT DETECTED NOT DETECTED Final   Sapovirus (I, II, IV, and V) NOT DETECTED NOT DETECTED Final  C difficile quick scan w PCR reflex     Status: None   Collection Time: 11/27/15 12:50 PM  Result Value Ref Range Status   C Diff antigen NEGATIVE NEGATIVE Final   C Diff toxin NEGATIVE NEGATIVE Final   C Diff interpretation Negative for toxigenic C. difficile  Final     Studies: Ct Head Wo Contrast  11/25/2015  CLINICAL DATA:  Pt states that he had a seizure today and has been continuing to have blood in his stools. States that he has not taken his seizure meds in several days because he had no money for it. Alert and oriented . EXAM: CT HEAD WITHOUT CONTRAST CT CERVICAL SPINE WITHOUT CONTRAST TECHNIQUE: Multidetector CT imaging of the head and cervical spine was performed following the standard protocol without intravenous contrast. Multiplanar CT image reconstructions of the cervical spine were also generated. COMPARISON:  10/13/2015 FINDINGS: CT HEAD FINDINGS The ventricles are normal in configuration. There is mild ventricular and sulcal enlargement reflecting volume loss greater than expected for a patient of this age. There are no parenchymal masses or mass effect. There is no evidence of infarct. No extra-axial masses or abnormal fluid  collections. There is no intracranial hemorrhage. The visualized sinuses and mastoid air cells are clear. No skull lesion. CT CERVICAL SPINE FINDINGS No fracture. No spondylolisthesis. Minor disc degenerative changes at C4-C5 and C5-C6. No significant stenosis. Soft tissues are unremarkable. Lung apices are clear. IMPRESSION: HEAD CT: No acute intracranial abnormalities. Mild atrophy advanced for age. CERVICAL CT:  No fracture or acute finding. Electronically Signed   By: Lajean Manes M.D.   On: 11/25/2015 20:08   Ct Cervical Spine Wo Contrast  11/25/2015  CLINICAL DATA:  Pt states that he had a seizure today and has been continuing to have blood in his stools. States that he has not taken his seizure meds in several days because he had no money for it. Alert and oriented . EXAM: CT HEAD WITHOUT CONTRAST CT CERVICAL SPINE WITHOUT CONTRAST TECHNIQUE: Multidetector CT imaging of the head and cervical spine was performed following the standard protocol without intravenous contrast. Multiplanar CT image reconstructions of the cervical spine were also generated. COMPARISON:  10/13/2015 FINDINGS: CT HEAD FINDINGS The ventricles are normal in configuration. There is mild ventricular and sulcal enlargement reflecting volume loss greater than expected for a patient of this age. There are no parenchymal masses or mass effect. There is no evidence of infarct. No extra-axial masses or abnormal fluid collections. There is no intracranial hemorrhage. The visualized sinuses and mastoid air cells are clear. No skull lesion. CT CERVICAL SPINE FINDINGS No fracture. No spondylolisthesis. Minor disc degenerative changes at C4-C5 and C5-C6. No significant stenosis. Soft tissues are unremarkable. Lung apices are clear. IMPRESSION: HEAD CT: No acute intracranial abnormalities. Mild atrophy advanced for age. CERVICAL CT:  No fracture or acute finding. Electronically Signed   By: Lajean Manes M.D.   On: 11/25/2015 20:08   Dg Hip  Unilat With Pelvis 2-3 Views Left  11/25/2015  CLINICAL DATA:  Fall today with left hip pain, initial encounter EXAM: DG HIP (WITH OR WITHOUT PELVIS) 2-3V LEFT COMPARISON:  10/11/2015 FINDINGS: Bilateral degenerative changes of the hip joints are noted. The pelvic ring is intact. No acute fracture or dislocation is noted. No gross soft tissue abnormality is noted. IMPRESSION: Degenerative change without acute abnormality. Electronically Signed   By: Inez Catalina M.D.   On: 11/25/2015 18:13    Scheduled Meds: . carvedilol  12.5 mg Oral BID WC  . cefTRIAXone (ROCEPHIN)  IV  1 g Intravenous Q24H  . feeding supplement  1 Container Oral TID BM  . folic acid  1 mg Oral Daily  . levETIRAcetam  500 mg Oral BID  . metronidazole  500 mg Intravenous Q8H  . multivitamin with minerals  1 tablet Oral Daily  . saccharomyces boulardii  250 mg Oral BID  . sodium chloride flush  3 mL Intravenous Q12H  . thiamine  100 mg Oral Daily   Or  . thiamine  100 mg Intravenous Daily   Continuous Infusions: . 0.9 % NaCl with KCl 40 mEq / L 125 mL/hr (11/27/15 1457)    Principal Problem:   Enteritis Active Problems:   Alcohol abuse   Seizure disorder ([place])   Hypokalemia   Hypomagnesemia   Rectal bleeding   Urinary tract infectious disease    Time spent: 35 minutes    Barton Dubois  [facility] Pager [phone]. If 7PM-7AM, please contact night-coverage at [email or web address], password [email or web address] 11/27/2015, 5:17 PM  LOS: 1 day

## 2015-11-27 NOTE — Progress Notes (Signed)
Initial Nutrition Assessment  DOCUMENTATION CODES:   Severe malnutrition in context of acute illness/injury  INTERVENTION:  - Will d/c Ensure Enlive - Will order Boost Breeze po TID, each supplement provides 250 kcal and 9 grams of protein - RD will continue to monitor for nutrition-related needs   NUTRITION DIAGNOSIS:   Unintentional weight loss related to acute illness as evidenced by per patient/family report, percent weight loss.  GOAL:   Patient will meet greater than or equal to 90% of their needs  MONITOR:   PO intake, Supplement acceptance, Weight trends, Labs, I & O's  REASON FOR ASSESSMENT:   Malnutrition Screening Tool  ASSESSMENT:   40 y.o. male with a past medical history of seizure disorder, alcohol abuse, GERD who is coming to the emergency department due to having a seizure, persistent diarrhea and occasional hematochezia.  Pt seen for MST. BMI intakes normal weight. No intakes documented since admission. Pt reports that he drank cranberry juice and water and ate [place] toast for breakfast this AM. Pt reports diarrhea and nausea following intake of these items so he did not eat anything more from breakfast tray; noted bowl of fruit and container of cereal on bedside stand. Pt states that appetite has been decreased recently with ongoing nausea and diarrhea x2 weeks. Notes indicate bloody stools with as many as 10 episodes/day PTA. Pt denies any abdominal pain or vomiting occuring during the past 2 weeks but states L-sided pain related to fall PTA (head, back, L flank).   Pt is unsure of UBW and feels that he has lost weight recently due to change in the way clothes fit but is unsure of amount of weight lost. He states that when he was weighed on admission he was informed that he lost 30 lbs in the past 2 months. Chart review, indicates 9 lb weight loss (6.5% body weight) in the past 1 week which is significant for time frame. In addition, he lost 13 lbs (10% body  weight) in the past 6 months which is also significant for time frame. Physical assessment done to upper body old and shows mild muscle and moderate fat wasting.  Not meeting needs. Will change supplement order as outlined above given nausea and diarrhea and possible better tolerance. Will continue to monitor and adjust as warranted. IVF: NS-40 mEq KCl @ 125 mL/hr. Medications reviewed; 1 mg folic acid/day, daily multivitamin with minerals, 40 mEq oral KCl every 4 hours, 100 mg thiamine/day. Labs reviewed; Cl: 114 mmol/L, BUN <5 mg/dL, creatinine low, Ca: 8.1 mg/dL, AST elevated but trending down.    Diet Order:  Diet Heart Room service appropriate?: Yes; Fluid consistency:: Thin  Skin:  Reviewed, no issues  Last BM:  3/27  Height:   Ht Readings from Last 1 Encounters:  11/26/15 5\' 6"  (1.676 m)    Weight:   Wt Readings from Last 1 Encounters:  11/26/15 129 lb 3 oz (58.6 kg)    Ideal Body Weight:  64.54 kg (kg)  BMI:  Body mass index is 20.86 kg/(m^2).  Estimated Nutritional Needs:   Kcal:  1750-1950 (30-33 kcal/kg)  Protein:  70-80 grams  Fluid:  >/= 2.2 L/day  EDUCATION NEEDS:   No education needs identified at this time     Jarome Matin, RD, LDN Inpatient Clinical Dietitian Pager # [phone] After hours/weekend pager # [phone]

## 2015-11-28 DIAGNOSIS — F1010 Alcohol abuse, uncomplicated: Secondary | ICD-10-CM

## 2015-11-28 DIAGNOSIS — G40909 Epilepsy, unspecified, not intractable, without status epilepticus: Secondary | ICD-10-CM

## 2015-11-28 DIAGNOSIS — N390 Urinary tract infection, site not specified: Secondary | ICD-10-CM

## 2015-11-28 DIAGNOSIS — E876 Hypokalemia: Secondary | ICD-10-CM

## 2015-11-28 DIAGNOSIS — K529 Noninfective gastroenteritis and colitis, unspecified: Secondary | ICD-10-CM

## 2015-11-28 DIAGNOSIS — R319 Hematuria, unspecified: Secondary | ICD-10-CM

## 2015-11-28 LAB — PHOSPHORUS: PHOSPHORUS: 2.5 mg/dL (ref 2.5–4.6)

## 2015-11-28 LAB — BASIC METABOLIC PANEL
ANION GAP: 4 — AB (ref 5–15)
BUN: <5 mg/dL — ABNORMAL LOW (ref 6–20)
CALCIUM: 8.2 mg/dL — AB (ref 8.9–10.3)
CO2: 24 mmol/L (ref 22–32)
Chloride: 114 mmol/L — ABNORMAL HIGH (ref 101–111)
Creatinine, Ser: 0.49 mg/dL — ABNORMAL LOW (ref 0.61–1.24)
Glucose, Bld: 65 mg/dL (ref 65–99)
Potassium: 3.9 mmol/L (ref 3.5–5.1)
Sodium: 142 mmol/L (ref 135–145)

## 2015-11-28 LAB — CBC
HCT: 29.5 % — ABNORMAL LOW (ref 39.0–52.0)
HEMOGLOBIN: 10.2 g/dL — AB (ref 13.0–17.0)
MCH: 34.1 pg — ABNORMAL HIGH (ref 26.0–34.0)
MCHC: 34.6 g/dL (ref 30.0–36.0)
MCV: 98.7 fL (ref 78.0–100.0)
Platelets: 202 10*3/uL (ref 150–400)
RBC: 2.99 MIL/uL — AB (ref 4.22–5.81)
RDW: 14.6 % (ref 11.5–15.5)
WBC: 4.8 10*3/uL (ref 4.0–10.5)

## 2015-11-28 LAB — MAGNESIUM: MAGNESIUM: 1.5 mg/dL — AB (ref 1.7–2.4)

## 2015-11-28 MED ORDER — IBUPROFEN 800 MG PO TABS
800.0000 mg | ORAL_TABLET | Freq: Three times a day (TID) | ORAL | Status: DC | PRN
  Administered 2015-11-29 (×2): 800 mg via ORAL
  Filled 2015-11-28 (×2): qty 1

## 2015-11-28 MED ORDER — SODIUM CHLORIDE 0.45 % IV SOLN
INTRAVENOUS | Status: DC
  Filled 2015-11-28: qty 1000

## 2015-11-28 MED ORDER — SODIUM CHLORIDE 0.45 % IV SOLN
INTRAVENOUS | Status: AC
  Administered 2015-11-28: 15:00:00 via INTRAVENOUS
  Filled 2015-11-28: qty 1000

## 2015-11-28 MED ORDER — CEFUROXIME AXETIL 250 MG PO TABS
250.0000 mg | ORAL_TABLET | Freq: Two times a day (BID) | ORAL | Status: DC
  Administered 2015-11-28 – 2015-11-29 (×2): 250 mg via ORAL
  Filled 2015-11-28 (×3): qty 1

## 2015-11-28 MED ORDER — METHOCARBAMOL 500 MG PO TABS
500.0000 mg | ORAL_TABLET | Freq: Two times a day (BID) | ORAL | Status: DC
  Administered 2015-11-28 – 2015-11-30 (×5): 500 mg via ORAL
  Filled 2015-11-28 (×7): qty 1

## 2015-11-28 NOTE — Progress Notes (Signed)
[facility] PROGRESS NOTE    Progress Note   Joseph Haney [id number] DOB: [birth year] DOA: 11/25/2015 PCP: Shona Needles [facility]   Brief Narrative:   Joseph Haney is an 40 y.o. male that comes in with nausea vomiting and diarrhea  Assessment/Plan:   Nausea vomiting and diarrhea likely due to Viral gastro-  Enteritis C. difficile was negative GI pathogen panel was negative. He was started empirically on oral loperamide, and Flagyl was discontinued. Tolerating diet. Still having diarrhea continue loperamide and Florastor. Mildly nauseated but able to tolerate diet.  Essential hypertension: Well controlled continue current regimen.  Urinary tract infection: Was started empirically on IV Rocephin. Urine culture was not sent we'll DC Rocephin and start him on Ceftin. Continue Ceftin for 3 additional days.  Severe protein caloric malnutrition: Tolerating diet.  Hypokalemia/hypomagnesemia: Due to GI losses not repeated.  Alcohol abuse/transaminitis: Likely due to alcohol use cessation was provided. No signs of withdrawals.  Seizure disorder: With history of noncompliance due to financial troubles. Continue Keppra. No events in house.  DVT Prophylaxis - Lovenox ordered.  Family Communication: none Disposition Plan: Home in am Code Status:     Code Status Orders        Start     Ordered   11/26/15 0033  Full code   Continuous     11/26/15 0032    Code Status History    Date Active Date Inactive Code Status Order ID Comments User Context   10/10/2015  5:34 PM 10/12/2015  3:05 PM Full Code [id number]  Debbe Odea, MD ED   05/21/2015 10:40 AM 05/22/2015  7:33 PM Full Code [id number]  Theressa Millard, MD Inpatient   05/20/2013  2:42 AM 05/23/2013  4:01 PM Full Code [id number]  Orvan Falconer, MD Inpatient   10/12/2012  5:37 AM 10/13/2012  5:06 PM Full Code [id number]  Jeralene Huff Inpatient   07/25/2012  4:45 PM 07/27/2012  5:26 PM Full Code [id number]   Neysa Bonito, RN Inpatient        IV Access:    Peripheral IV   Procedures and diagnostic studies:   No results found.   Medical Consultants:    None.  Anti-Infectives:   Anti-infectives    Start     Dose/Rate Route Frequency Ordered Stop   11/26/15 1300  cefTRIAXone (ROCEPHIN) 1 g in dextrose 5 % 50 mL IVPB     1 g 100 mL/hr over 30 Minutes Intravenous Every 24 hours 11/26/15 1140     11/26/15 0500  metroNIDAZOLE (FLAGYL) IVPB 500 mg  Status:  Discontinued     500 mg 100 mL/hr over 60 Minutes Intravenous Every 8 hours 11/26/15 0406 11/27/15 1722      Subjective:    Joseph Haney he relates he continues to have diarrhea no blood.  Objective:    Filed Vitals:   11/27/15 0459 11/27/15 1502 11/27/15 2351 11/28/15 0049  BP: 112/78 131/83 142/102 138/93  Pulse: 80 95 91   Temp: 98 F (36.7 C) 98.3 F (36.8 C) 98 F (36.7 C)   TempSrc: Oral Oral Oral   Resp: 16 18 18    Height:      Weight:      SpO2: 100% 100% 100%     Intake/Output Summary (Last 24 hours) at 11/28/15 1254 Last data filed at 11/28/15 1158  Gross per 24 hour  Intake 3187.5 ml  Output   2300 ml  Net  887.5 ml   [employer]  11/26/15 0025  Weight: 58.6 kg (129 lb 3 oz)    Exam: Gen:  NAD Cardiovascular:  RRR. Chest and lungs:   CTAB Abdomen:  Abdomen soft, NT/ND, + BS Extremities:  No edema   Data Reviewed:    Labs: Basic Metabolic Panel:  Recent Labs Lab 11/25/15 1857 11/26/15 0541 11/27/15 0517 11/28/15 0501  NA 139 139 142 142  K 2.5* 2.6* 4.4 3.9  CL 99* 104 114* 114*  CO2 24 24 22 24   GLUCOSE 118* 97 90 65  BUN <5* <5* <5* <5*  CREATININE 0.59* 0.55* 0.57* 0.49*  CALCIUM 9.0 7.7* 8.1* 8.2*  MG 1.2*  --   --  1.5*  PHOS 2.4* 2.4*  --  2.5   GFR Estimated Creatinine Clearance: 100.7 mL/min (by C-G formula based on Cr of 0.49). Liver Function Tests:  Recent Labs Lab 11/25/15 1857 11/26/15 0541 11/27/15 0517  AST 143* 84* 81*  ALT 28  20 19   ALKPHOS 111 89 73  BILITOT 2.0* 1.5* 0.8  PROT 9.2* 7.0 6.5  ALBUMIN 4.6 3.3* 3.2*    Recent Labs Lab 11/25/15 1857  LIPASE 35   No results for input(s): AMMONIA in the last 168 hours. Coagulation profile  Recent Labs Lab 11/25/15 1857  INR 1.14    CBC:  Recent Labs Lab 11/25/15 1857 11/26/15 0541 11/27/15 0517 11/28/15 0501  WBC 6.6 5.0 4.1 4.8  NEUTROABS 5.1 3.0 2.6  --   HGB 12.6* 10.8* 9.9* 10.2*  HCT 34.7* 30.2* 28.7* 29.5*  MCV 94.3 96.8 100.7* 98.7  PLT 232 181 183 202   Cardiac Enzymes: No results for input(s): CKTOTAL, CKMB, CKMBINDEX, TROPONINI in the last 168 hours. BNP (last 3 results) No results for input(s): PROBNP in the last 8760 hours. CBG:  Recent Labs Lab 11/25/15 1711  GLUCAP 122*   D-Dimer: No results for input(s): DDIMER in the last 72 hours. Hgb A1c: No results for input(s): HGBA1C in the last 72 hours. Lipid Profile: No results for input(s): CHOL, HDL, LDLCALC, TRIG, CHOLHDL, LDLDIRECT in the last 72 hours. Thyroid function studies: No results for input(s): TSH, T4TOTAL, T3FREE, THYROIDAB in the last 72 hours.  Invalid input(s): FREET3 Anemia work up: No results for input(s): VITAMINB12, FOLATE, FERRITIN, TIBC, IRON, RETICCTPCT in the last 72 hours. Sepsis Labs:  Recent Labs Lab 11/25/15 1857 11/26/15 0541 11/27/15 0517 11/28/15 0501  WBC 6.6 5.0 4.1 4.8   Microbiology Recent Results (from the past 240 hour(s))  Gastrointestinal Panel by PCR , Stool     Status: None   Collection Time: 11/25/15 10:41 PM  Result Value Ref Range Status   Campylobacter species NOT DETECTED NOT DETECTED Final   Plesimonas shigelloides NOT DETECTED NOT DETECTED Final   Salmonella species NOT DETECTED NOT DETECTED Final   Yersinia enterocolitica NOT DETECTED NOT DETECTED Final   Vibrio species NOT DETECTED NOT DETECTED Final   Vibrio cholerae NOT DETECTED NOT DETECTED Final   Enteroaggregative E coli (EAEC) NOT DETECTED NOT  DETECTED Final   Enteropathogenic E coli (EPEC) NOT DETECTED NOT DETECTED Final   Enterotoxigenic E coli (ETEC) NOT DETECTED NOT DETECTED Final   Shiga like toxin producing E coli (STEC) NOT DETECTED NOT DETECTED Final   E. coli O157 NOT DETECTED NOT DETECTED Final   Shigella/Enteroinvasive E coli (EIEC) NOT DETECTED NOT DETECTED Final   Cryptosporidium NOT DETECTED NOT DETECTED Final   Cyclospora cayetanensis NOT DETECTED NOT DETECTED Final   Entamoeba histolytica NOT DETECTED NOT DETECTED Final  Giardia lamblia NOT DETECTED NOT DETECTED Final   Adenovirus F40/41 NOT DETECTED NOT DETECTED Final   Astrovirus NOT DETECTED NOT DETECTED Final   Norovirus GI/GII NOT DETECTED NOT DETECTED Final   Rotavirus A NOT DETECTED NOT DETECTED Final   Sapovirus (I, II, IV, and V) NOT DETECTED NOT DETECTED Final  C difficile quick scan w PCR reflex     Status: None   Collection Time: 11/27/15 12:50 PM  Result Value Ref Range Status   C Diff antigen NEGATIVE NEGATIVE Final   C Diff toxin NEGATIVE NEGATIVE Final   C Diff interpretation Negative for toxigenic C. difficile  Final     Medications:   . carvedilol  12.5 mg Oral BID WC  . cefTRIAXone (ROCEPHIN)  IV  1 g Intravenous Q24H  . feeding supplement  1 Container Oral TID BM  . folic acid  1 mg Oral Daily  . levETIRAcetam  500 mg Oral BID  . methocarbamol  500 mg Oral BID  . multivitamin with minerals  1 tablet Oral Daily  . saccharomyces boulardii  250 mg Oral BID  . sodium chloride flush  3 mL Intravenous Q12H  . thiamine  100 mg Oral Daily   Or  . thiamine  100 mg Intravenous Daily   Continuous Infusions: . 0.9 % NaCl with KCl 40 mEq / L 125 mL/hr (11/28/15 [id number])    Time spent: 25 min   LOS: 2 days   Charlynne Cousins  [facility] Pager [phone]  *Please refer to [email or web address], password [email or web address] to get updated schedule on who will round on this patient, as hospitalists switch teams weekly. If 7PM-7AM, please contact  night-coverage at [email or web address], password [email or web address] for any overnight needs.  11/28/2015, 12:54 PM

## 2015-11-29 MED ORDER — LORAZEPAM 1 MG PO TABS
1.0000 mg | ORAL_TABLET | Freq: Four times a day (QID) | ORAL | Status: DC | PRN
  Administered 2015-11-29: 1 mg via ORAL
  Filled 2015-11-29: qty 1

## 2015-11-29 NOTE — Progress Notes (Signed)
Hospitalist provider request that pt be given loperamide with each BM(diarrhea) not to exceed 10 doses within a day.

## 2015-11-29 NOTE — Progress Notes (Signed)
[facility] PROGRESS NOTE    Progress Note   Emin Dolloff [id number] DOB: 03/22/1976 DOA: 11/25/2015 PCP: [facility]   Brief Narrative:   Joseph Haney is an 40 y.o. male that comes in with nausea vomiting and diarrhea  Assessment/Plan:   Nausea vomiting and diarrhea likely due to Viral gastro-  Enteritis C. difficile was negative GI pathogen panel was negative. He was started empirically on oral loperamide, and Flagyl was discontinued. Still having diarrhea continue loperamide and Florastor. Nausea now resolved.  Essential hypertension: Well controlled continue current regimen.  Urinary tract infection: Was started empirically on IV Rocephin. Urine culture was not sent we'll DC ceftin.  Severe protein caloric malnutrition: Tolerating diet.  Hypokalemia/hypomagnesemia: Due to GI losses not repeated.  Alcohol abuse/transaminitis: Likely due to alcohol use cessation was provided. No signs of withdrawals.  Seizure disorder: With history of noncompliance due to financial troubles. Continue Keppra. No events in house.  DVT Prophylaxis - Lovenox ordered.  Family Communication: none Disposition Plan: Home on 4.1.2017 Code Status:     Code Status Orders        Start     Ordered   11/26/15 0033  Full code   Continuous     11/26/15 0032    Code Status History    Date Active Date Inactive Code Status Order ID Comments User Context   10/10/2015  5:34 PM 10/12/2015  3:05 PM Full Code [id number]  Debbe Odea, MD ED   05/21/2015 10:40 AM 05/22/2015  7:33 PM Full Code [id number]  Theressa Millard, MD Inpatient   05/20/2013  2:42 AM 05/23/2013  4:01 PM Full Code [id number]  Orvan Falconer, MD Inpatient   10/12/2012  5:37 AM 10/13/2012  5:06 PM Full Code [id number]  Jeralene Huff Inpatient   07/25/2012  4:45 PM 07/27/2012  5:26 PM Full Code [id number]  Neysa Bonito, RN Inpatient        IV Access:    Peripheral IV   Procedures and  diagnostic studies:   No results found.   Medical Consultants:    None.  Anti-Infectives:   Anti-infectives    Start     Dose/Rate Route Frequency Ordered Stop   11/28/15 1700  cefUROXime (CEFTIN) tablet 250 mg     250 mg Oral 2 times daily with meals 11/28/15 1300     11/26/15 1300  cefTRIAXone (ROCEPHIN) 1 g in dextrose 5 % 50 mL IVPB  Status:  Discontinued     1 g 100 mL/hr over 30 Minutes Intravenous Every 24 hours 11/26/15 1140 11/28/15 1300   11/26/15 0500  metroNIDAZOLE (FLAGYL) IVPB 500 mg  Status:  Discontinued     500 mg 100 mL/hr over 60 Minutes Intravenous Every 8 hours 11/26/15 0406 11/27/15 1722      Subjective:    [name] he relates he continues to have diarrhea no blood.  Objective:    Filed Vitals:   11/28/15 1808 11/28/15 2050 11/29/15 0628 11/29/15 0831  BP: 145/96 142/96 148/98 142/95  Pulse: 83 76 81 88  Temp:  98.3 F (36.8 C) 98 F (36.7 C)   TempSrc:  Oral Oral   Resp: 20 20 19    Height:      Weight:      SpO2: 100% 100% 100% 100%    Intake/Output Summary (Last 24 hours) at 11/29/15 0934 Last data filed at 11/29/15 0057  Gross per 24 hour  Intake    545 ml  Output  1500 ml  Net   -955 ml   Filed Weights   11/26/15 0025  Weight: 58.6 kg (129 lb 3 oz)    Exam: Gen:  NAD Cardiovascular:  RRR. Chest and lungs:   CTAB Abdomen:  Abdomen soft, NT/ND, + BS Extremities:  No edema   Data Reviewed:    Labs: Basic Metabolic Panel:  Recent Labs Lab 11/25/15 1857 11/26/15 0541 11/27/15 0517 11/28/15 0501  NA 139 139 142 142  K 2.5* 2.6* 4.4 3.9  CL 99* 104 114* 114*  CO2 24 24 22 24   GLUCOSE 118* 97 90 65  BUN <5* <5* <5* <5*  CREATININE 0.59* 0.55* 0.57* 0.49*  CALCIUM 9.0 7.7* 8.1* 8.2*  MG 1.2*  --   --  1.5*  PHOS 2.4* 2.4*  --  2.5   GFR Estimated Creatinine Clearance: 100.7 mL/min (by C-G formula based on Cr of 0.49). Liver Function Tests:  Recent Labs Lab 11/25/15 1857 11/26/15 0541  11/27/15 0517  AST 143* 84* 81*  ALT 28 20 19   ALKPHOS 111 89 73  BILITOT 2.0* 1.5* 0.8  PROT 9.2* 7.0 6.5  ALBUMIN 4.6 3.3* 3.2*    Recent Labs Lab 11/25/15 1857  LIPASE 35   No results for input(s): AMMONIA in the last 168 hours. Coagulation profile  Recent Labs Lab 11/25/15 1857  INR 1.14    CBC:  Recent Labs Lab 11/25/15 1857 11/26/15 0541 11/27/15 0517 11/28/15 0501  WBC 6.6 5.0 4.1 4.8  NEUTROABS 5.1 3.0 2.6  --   HGB 12.6* 10.8* 9.9* 10.2*  HCT 34.7* 30.2* 28.7* 29.5*  MCV 94.3 96.8 100.7* 98.7  PLT 232 181 183 202   Cardiac Enzymes: No results for input(s): CKTOTAL, CKMB, CKMBINDEX, TROPONINI in the last 168 hours. BNP (last 3 results) No results for input(s): PROBNP in the last 8760 hours. CBG:  Recent Labs Lab 11/25/15 1711  GLUCAP 122*   D-Dimer: No results for input(s): DDIMER in the last 72 hours. Hgb A1c: No results for input(s): HGBA1C in the last 72 hours. Lipid Profile: No results for input(s): CHOL, HDL, LDLCALC, TRIG, CHOLHDL, LDLDIRECT in the last 72 hours. Thyroid function studies: No results for input(s): TSH, T4TOTAL, T3FREE, THYROIDAB in the last 72 hours.  Invalid input(s): FREET3 Anemia work up: No results for input(s): VITAMINB12, FOLATE, FERRITIN, TIBC, IRON, RETICCTPCT in the last 72 hours. Sepsis Labs:  Recent Labs Lab 11/25/15 1857 11/26/15 0541 11/27/15 0517 11/28/15 0501  WBC 6.6 5.0 4.1 4.8   Microbiology Recent Results (from the past 240 hour(s))  Gastrointestinal Panel by PCR , Stool     Status: None   Collection Time: 11/25/15 10:41 PM  Result Value Ref Range Status   Campylobacter species NOT DETECTED NOT DETECTED Final   Plesimonas shigelloides NOT DETECTED NOT DETECTED Final   Salmonella species NOT DETECTED NOT DETECTED Final   Yersinia enterocolitica NOT DETECTED NOT DETECTED Final   Vibrio species NOT DETECTED NOT DETECTED Final   Vibrio cholerae NOT DETECTED NOT DETECTED Final    Enteroaggregative E coli (EAEC) NOT DETECTED NOT DETECTED Final   Enteropathogenic E coli (EPEC) NOT DETECTED NOT DETECTED Final   Enterotoxigenic E coli (ETEC) NOT DETECTED NOT DETECTED Final   Shiga like toxin producing E coli (STEC) NOT DETECTED NOT DETECTED Final   E. coli O157 NOT DETECTED NOT DETECTED Final   Shigella/Enteroinvasive E coli (EIEC) NOT DETECTED NOT DETECTED Final   Cryptosporidium NOT DETECTED NOT DETECTED Final   Cyclospora cayetanensis  NOT DETECTED NOT DETECTED Final   Entamoeba histolytica NOT DETECTED NOT DETECTED Final   Giardia lamblia NOT DETECTED NOT DETECTED Final   Adenovirus F40/41 NOT DETECTED NOT DETECTED Final   Astrovirus NOT DETECTED NOT DETECTED Final   Norovirus GI/GII NOT DETECTED NOT DETECTED Final   Rotavirus A NOT DETECTED NOT DETECTED Final   Sapovirus (I, II, IV, and V) NOT DETECTED NOT DETECTED Final  C difficile quick scan w PCR reflex     Status: None   Collection Time: 11/27/15 12:50 PM  Result Value Ref Range Status   C Diff antigen NEGATIVE NEGATIVE Final   C Diff toxin NEGATIVE NEGATIVE Final   C Diff interpretation Negative for toxigenic C. difficile  Final     Medications:   . carvedilol  12.5 mg Oral BID WC  . cefUROXime  250 mg Oral BID WC  . feeding supplement  1 Container Oral TID BM  . folic acid  1 mg Oral Daily  . levETIRAcetam  500 mg Oral BID  . methocarbamol  500 mg Oral BID  . multivitamin with minerals  1 tablet Oral Daily  . saccharomyces boulardii  250 mg Oral BID  . sodium chloride flush  3 mL Intravenous Q12H  . thiamine  100 mg Oral Daily   Or  . thiamine  100 mg Intravenous Daily   Continuous Infusions:    Time spent: 25 min   LOS: 3 days   Charlynne Cousins  [facility] Pager [phone]  *Please refer to [email or web address], password [email or web address] to get updated schedule on who will round on this patient, as hospitalists switch teams weekly. If 7PM-7AM, please contact night-coverage at [email or web address],  password [email or web address] for any overnight needs.  11/29/2015, 9:34 AM

## 2015-11-29 NOTE — Progress Notes (Signed)
CMT contacted RN to report 11 beats VTACH. Pt assessed, asymptomatic. Pt specifically denied CP,SOB, N/V. VS 142/95, 88, 18, 100%ra

## 2015-11-30 MED ORDER — LOPERAMIDE HCL 2 MG PO CAPS: 2.0000 mg | ORAL_CAPSULE | ORAL | Status: DC | PRN

## 2015-11-30 NOTE — Discharge Summary (Signed)
Physician Discharge Summary  Joseph Haney [id number] DOB: May 28, 1976 DOA: 11/25/2015  PCP: [facility] date: 11/25/2015 Discharge date: 11/30/2015  Time spent: 35 minutes  Recommendations for Outpatient Follow-up:  1. Follow-up with primary care doctor in 2-4 weeks.   Discharge Diagnoses:  Principal Problem:   Enteritis Active Problems:   Alcohol abuse   Seizure disorder (HCC)   Hypokalemia   Hypomagnesemia   Rectal bleeding   Urinary tract infectious disease   Discharge Condition: stable  Diet recommendation: regular  Filed Weights   11/26/15 0025  Weight: 58.6 kg (129 lb 3 oz)    History of present illness:  40 year old male with past medical history of seizure disorder and alcohol abuse comes to the emergency room for seizures and persistent occasional diarrhea.  Hospital Course:  Nausea and vomiting and diarrhea likely due to viral gastroenteritis: GI pathogen was negative, C. difficile PCR was negative. He was initially started on oral Flagyl and this was discontinued once  he does came back negative. He was started on Lomotil and his diarrhea improved. He will be discharged on Lomotil Florastor.  Essential hypertension: No change or pneumatosis medication.  Urinary tract infection: He was started on IV Rocephin on admission remain asymptomatic he received 5 day course of IV antibiotics.  Alcohol abuse/transaminitis: Likely due to alcohol no signs of withdrawal.  Seizure disorder: With history of noncompliance of his medications due to financial troubles. He was continue Keppra. No events in house.   Procedures:  CT C-spine  CT head  Bilateral hip x-ray  Consultations:  none  Discharge Exam: Filed Vitals:   11/29/15 2039 11/30/15 0626  BP: 132/84 143/98  Pulse: 82 77  Temp: 97.9 F (36.6 C) 98.1 F (36.7 C)  Resp: 18 16    General: A&O x3 Cardiovascular: RRR Respiratory: good air movement CTA B/L  Discharge  Instructions   Discharge Instructions    Diet - low sodium heart healthy    Complete by:  As directed      Increase activity slowly    Complete by:  As directed           Current Discharge Medication List    START taking these medications   Details  loperamide (IMODIUM) 2 MG capsule Take 1 capsule (2 mg total) by mouth as needed for diarrhea or loose stools. Qty: 30 capsule, Refills: 0      CONTINUE these medications which have NOT CHANGED   Details  carvedilol (COREG) 6.25 MG tablet Take 2 tablets (12.5 mg total) by mouth 2 (two) times daily with a meal. Qty: 60 tablet, Refills: 3    docusate sodium (COLACE) 100 MG capsule Take 100 mg by mouth daily.    levETIRAcetam (KEPPRA) 500 MG tablet Take 1 tablet (500 mg total) by mouth 2 (two) times daily. Qty: 60 tablet, Refills: 0    methocarbamol (ROBAXIN) 500 MG tablet Take 1 tablet (500 mg total) by mouth 2 (two) times daily. Qty: 20 tablet, Refills: 0    omeprazole (PRILOSEC) 20 MG capsule Take 1 capsule (20 mg total) by mouth daily. Qty: 30 capsule, Refills: 0      STOP taking these medications     ibuprofen (ADVIL,MOTRIN) 800 MG tablet        Allergies  Allergen Reactions  . Morphine And Related Itching  . Other     Pt refuses blood products. He is a [employer] witness   Follow-up Information    Follow up with  [facility] In 2 weeks.   Specialty:  Family Medicine   Why:  Hospital follow-up   Contact information:   [address] [place] [address] [phone]        The results of significant diagnostics from this hospitalization (including imaging, microbiology, ancillary and laboratory) are listed below for reference.    Significant Diagnostic Studies: Ct Head Wo Contrast  11/25/2015  CLINICAL DATA:  Pt states that he had a seizure today and has been continuing to have blood in his stools. States that he has not taken his seizure meds in several days because he had no  money for it. Alert and oriented . EXAM: CT HEAD WITHOUT CONTRAST CT CERVICAL SPINE WITHOUT CONTRAST TECHNIQUE: Multidetector CT imaging of the head and cervical spine was performed following the standard protocol without intravenous contrast. Multiplanar CT image reconstructions of the cervical spine were also generated. COMPARISON:  10/13/2015 FINDINGS: CT HEAD FINDINGS The ventricles are normal in configuration. There is mild ventricular and sulcal enlargement reflecting volume loss greater than expected for a patient of this age. There are no parenchymal masses or mass effect. There is no evidence of infarct. No extra-axial masses or abnormal fluid collections. There is no intracranial hemorrhage. The visualized sinuses and mastoid air cells are clear. No skull lesion. CT CERVICAL SPINE FINDINGS No fracture. No spondylolisthesis. Minor disc degenerative changes at C4-C5 and C5-C6. No significant stenosis. Soft tissues are unremarkable. Lung apices are clear. IMPRESSION: HEAD CT: No acute intracranial abnormalities. Mild atrophy advanced for age. CERVICAL CT:  No fracture or acute finding. Electronically Signed   By: Lajean Manes M.D.   On: 11/25/2015 20:08   Ct Cervical Spine Wo Contrast  11/25/2015  CLINICAL DATA:  Pt states that he had a seizure today and has been continuing to have blood in his stools. States that he has not taken his seizure meds in several days because he had no money for it. Alert and oriented . EXAM: CT HEAD WITHOUT CONTRAST CT CERVICAL SPINE WITHOUT CONTRAST TECHNIQUE: Multidetector CT imaging of the head and cervical spine was performed following the standard protocol without intravenous contrast. Multiplanar CT image reconstructions of the cervical spine were also generated. COMPARISON:  10/13/2015 FINDINGS: CT HEAD FINDINGS The ventricles are normal in configuration. There is mild ventricular and sulcal enlargement reflecting volume loss greater than expected for a patient of this  age. There are no parenchymal masses or mass effect. There is no evidence of infarct. No extra-axial masses or abnormal fluid collections. There is no intracranial hemorrhage. The visualized sinuses and mastoid air cells are clear. No skull lesion. CT CERVICAL SPINE FINDINGS No fracture. No spondylolisthesis. Minor disc degenerative changes at C4-C5 and C5-C6. No significant stenosis. Soft tissues are unremarkable. Lung apices are clear. IMPRESSION: HEAD CT: No acute intracranial abnormalities. Mild atrophy advanced for age. CERVICAL CT:  No fracture or acute finding. Electronically Signed   By: Lajean Manes M.D.   On: 11/25/2015 20:08   Dg Hip Unilat With Pelvis 2-3 Views Left  11/25/2015  CLINICAL DATA:  Fall today with left hip pain, initial encounter EXAM: DG HIP (WITH OR WITHOUT PELVIS) 2-3V LEFT COMPARISON:  10/11/2015 FINDINGS: Bilateral degenerative changes of the hip joints are noted. The pelvic ring is intact. No acute fracture or dislocation is noted. No gross soft tissue abnormality is noted. IMPRESSION: Degenerative change without acute abnormality. Electronically Signed   By: Linus Mako.D.  On: 11/25/2015 18:13    Microbiology: Recent Results (from the past 240 hour(s))  Gastrointestinal Panel by PCR , Stool     Status: None   Collection Time: 11/25/15 10:41 PM  Result Value Ref Range Status   Campylobacter species NOT DETECTED NOT DETECTED Final   Plesimonas shigelloides NOT DETECTED NOT DETECTED Final   Salmonella species NOT DETECTED NOT DETECTED Final   Yersinia enterocolitica NOT DETECTED NOT DETECTED Final   Vibrio species NOT DETECTED NOT DETECTED Final   Vibrio cholerae NOT DETECTED NOT DETECTED Final   Enteroaggregative E coli (EAEC) NOT DETECTED NOT DETECTED Final   Enteropathogenic E coli (EPEC) NOT DETECTED NOT DETECTED Final   Enterotoxigenic E coli (ETEC) NOT DETECTED NOT DETECTED Final   Shiga like toxin producing E coli (STEC) NOT DETECTED NOT DETECTED Final    E. coli O157 NOT DETECTED NOT DETECTED Final   Shigella/Enteroinvasive E coli (EIEC) NOT DETECTED NOT DETECTED Final   Cryptosporidium NOT DETECTED NOT DETECTED Final   Cyclospora cayetanensis NOT DETECTED NOT DETECTED Final   Entamoeba histolytica NOT DETECTED NOT DETECTED Final   Giardia lamblia NOT DETECTED NOT DETECTED Final   Adenovirus F40/41 NOT DETECTED NOT DETECTED Final   Astrovirus NOT DETECTED NOT DETECTED Final   Norovirus GI/GII NOT DETECTED NOT DETECTED Final   Rotavirus A NOT DETECTED NOT DETECTED Final   Sapovirus (I, II, IV, and V) NOT DETECTED NOT DETECTED Final  C difficile quick scan w PCR reflex     Status: None   Collection Time: 11/27/15 12:50 PM  Result Value Ref Range Status   C Diff antigen NEGATIVE NEGATIVE Final   C Diff toxin NEGATIVE NEGATIVE Final   C Diff interpretation Negative for toxigenic C. difficile  Final     Labs: Basic Metabolic Panel:  Recent Labs Lab 11/25/15 1857 11/26/15 0541 11/27/15 0517 11/28/15 0501  NA 139 139 142 142  K 2.5* 2.6* 4.4 3.9  CL 99* 104 114* 114*  CO2 24 24 22 24   GLUCOSE 118* 97 90 65  BUN <5* <5* <5* <5*  CREATININE 0.59* 0.55* 0.57* 0.49*  CALCIUM 9.0 7.7* 8.1* 8.2*  MG 1.2*  --   --  1.5*  PHOS 2.4* 2.4*  --  2.5   Liver Function Tests:  Recent Labs Lab 11/25/15 1857 11/26/15 0541 11/27/15 0517  AST 143* 84* 81*  ALT 28 20 19   ALKPHOS 111 89 73  BILITOT 2.0* 1.5* 0.8  PROT 9.2* 7.0 6.5  ALBUMIN 4.6 3.3* 3.2*    Recent Labs Lab 11/25/15 1857  LIPASE 35   No results for input(s): AMMONIA in the last 168 hours. CBC:  Recent Labs Lab 11/25/15 1857 11/26/15 0541 11/27/15 0517 11/28/15 0501  WBC 6.6 5.0 4.1 4.8  NEUTROABS 5.1 3.0 2.6  --   HGB 12.6* 10.8* 9.9* 10.2*  HCT 34.7* 30.2* 28.7* 29.5*  MCV 94.3 96.8 100.7* 98.7  PLT 232 181 183 202   Cardiac Enzymes: No results for input(s): CKTOTAL, CKMB, CKMBINDEX, TROPONINI in the last 168 hours. BNP: BNP (last 3 results) No  results for input(s): BNP in the last 8760 hours.  ProBNP (last 3 results) No results for input(s): PROBNP in the last 8760 hours.  CBG:  Recent Labs Lab 11/25/15 1711  GLUCAP 122*     Signed:  Charlynne Cousins MD.  [facility] 11/30/2015, 9:55 AM

## 2015-11-30 NOTE — Care Management Note (Signed)
Case Management Note  Patient Details  Name: Joseph Haney MRN: [id number] Date of Birth: 1976-01-06  Subjective/Objective:                    Action/Plan:d/c home.   Expected Discharge Date:                  Expected Discharge Plan:  Home/Self Care  In-House Referral:     Discharge planning Services  CM Consult  Post Acute Care Choice:    Choice offered to:     DME Arranged:    DME Agency:     HH Arranged:    [place] Agency:     Status of Service:  Completed, signed off  Medicare Important Message Given:    Date Medicare IM Given:    Medicare IM give by:    Date Additional Medicare IM Given:    Additional Medicare Important Message give by:     If discussed at [place] of Stay Meetings, dates discussed:    Additional Comments:  Dessa Phi, RN 11/30/2015, 10:06 AM

## 2015-11-30 NOTE — Progress Notes (Signed)
Patient is A&O x4 and ambulatory. Discharge instructions reviewed. Pt denied questions, concerns.

## 2015-11-30 NOTE — Progress Notes (Signed)
Nutrition Follow-up  DOCUMENTATION CODES:   Severe malnutrition in context of acute illness/injury  INTERVENTION:  - Continue Boost Breeze TID - RD will monitor for additional needs if pt unable to d/c today  NUTRITION DIAGNOSIS:   Unintentional weight loss related to acute illness as evidenced by per patient/family report, percent weight loss. -ongoing  GOAL:   Patient will meet greater than or equal to 90% of their needs - minimally met on average  MONITOR:   PO intake, Supplement acceptance, Weight trends, Labs, I & O's  ASSESSMENT:   40 y.o. male with a past medical history of seizure disorder, alcohol abuse, GERD who is coming to the emergency department due to having a seizure, persistent diarrhea and occasional hematochezia.  3/31 Per chart review, pt ate 50% of breakfast and dinner 3/29 and 50% of lunch 3/30. Pt reports that for breakfast this AM he ate >50% of biscuit with egg and sausage, drank 100% of Boost Breeze, and plans to eat a banana. Pt states that appetite has been improving and that diarrhea has been resolving/resolved with no discomfort with PO intakes.  Pt asks about supplements in outpatient setting. Talked with him about options and also provided him with coupons. Pt likely meeting minimal needs at this time. Medications reviewed. Labs reviewed; Cl: 114 mmol/L, BUN <5 mg/dL, creatinine low, Ca: 8.2 mg/dL, Mg: 1.5 mg/dL.   3/28 - Pt reports that he drank cranberry juice and water and ate [place] toast for breakfast this AM.  - Pt reports diarrhea and nausea following intake of these items so he did not eat anything more from breakfast tray; noted bowl of fruit and container of cereal on bedside stand. - Pt states that appetite has been decreased recently with ongoing nausea and diarrhea x2 weeks.  - Notes indicate bloody stools with as many as 10 episodes/day PTA.  - Pt denies any abdominal pain or vomiting occuring during the past 2 weeks but states L-sided  pain related to fall PTA (head, back, L flank).  - Pt is unsure of UBW and feels that he has lost weight recently due to change in the way clothes fit but is unsure of amount of weight lost.  - He states that when he was weighed on admission he was informed that he lost 30 lbs in the past 2 months.  - Chart review, indicates 9 lb weight loss (6.5% body weight) in the past 1 week which is significant for time frame.  - In addition, he lost 13 lbs (10% body weight) in the past 6 months which is also significant for time frame.  - Physical assessment done to upper body old and shows mild muscle and moderate fat wasting.  Diet Order:  Diet Heart Room service appropriate?: Yes; Fluid consistency:: Thin Diet - low sodium heart healthy  Skin:  Reviewed, no issues  Last BM:  3/30  Height:   Ht Readings from Last 1 Encounters:  11/26/15 '5\' 6"'$  (1.676 m)    Weight:   Wt Readings from Last 1 Encounters:  11/26/15 129 lb 3 oz (58.6 kg)    Ideal Body Weight:  64.54 kg (kg)  BMI:  Body mass index is 20.86 kg/(m^2).  Estimated Nutritional Needs:   Kcal:  1750-1950 (30-33 kcal/kg)  Protein:  70-80 grams  Fluid:  >/= 2.2 L/day  EDUCATION NEEDS:   No education needs identified at this time     Jarome Matin, RD, LDN Inpatient Clinical Dietitian Pager # [phone] After hours/weekend  pager # [phone]

## 2015-11-30 NOTE — Progress Notes (Signed)
Patient was in Sinus Bradycardia. There was a pause of 3.07 secs. PCP on call was notifed.

## 2016-01-11 ENCOUNTER — Ambulatory Visit: Payer: Medicaid Other | Admitting: Gastroenterology

## 2016-01-11 ENCOUNTER — Telehealth: Payer: Self-pay | Admitting: Gastroenterology

## 2016-03-18 ENCOUNTER — Other Ambulatory Visit: Payer: Self-pay

## 2016-03-18 ENCOUNTER — Ambulatory Visit: Payer: Medicaid Other | Admitting: Gastroenterology

## 2016-04-04 IMAGING — CT CT HEAD W/O CM
3 of 6 series · 14 of 47 positions shown, 16 images · non-contrast
Comparison: 10/13/2015

CLINICAL DATA: Pt states that he had a seizure today and has been
continuing to have blood in his stools. States that he has not taken
his seizure meds in several days because he had no money for it.
Alert and oriented .

EXAM:
CT HEAD WITHOUT CONTRAST
CT CERVICAL SPINE WITHOUT CONTRAST
TECHNIQUE: Multidetector CT imaging of the head and cervical spine was
performed following the standard protocol without intravenous
contrast. Multiplanar CT image reconstructions of the cervical spine
were also generated.

[Series 8: axial recon · axial · 0.23mm/px · z∈[-347,-193]mm · 8 of 101 slices shown, 10 images]
[im 11/101  brain]
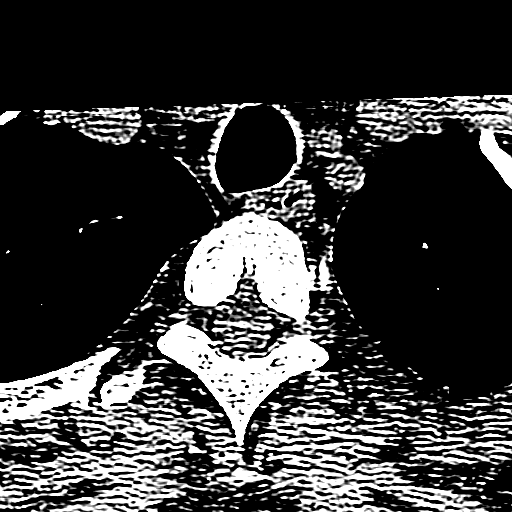
[im 11/101  bone]
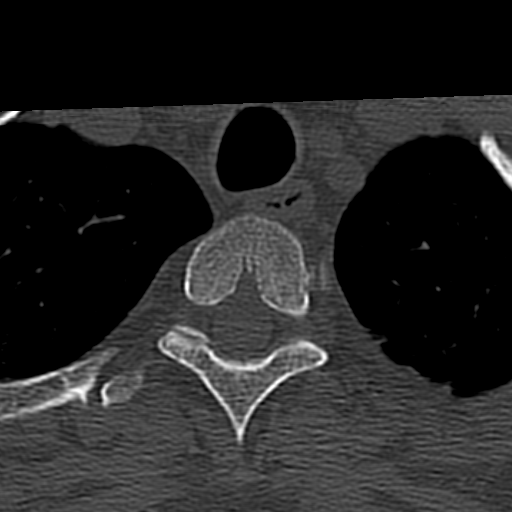
[im 21/101  brain]
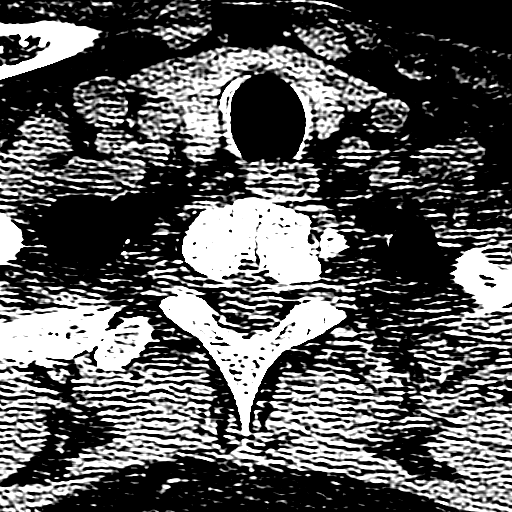
[im 31/101  brain]
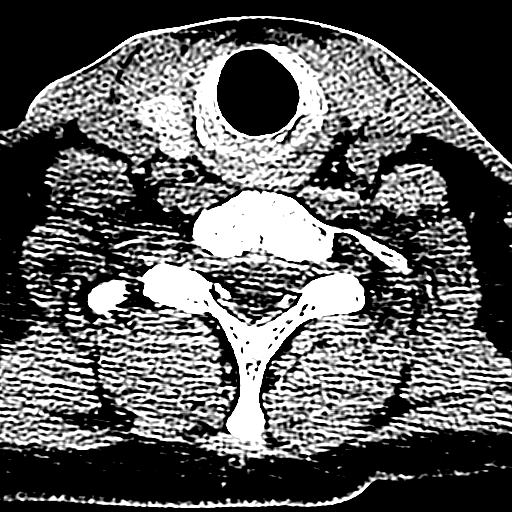
[im 41/101  brain]
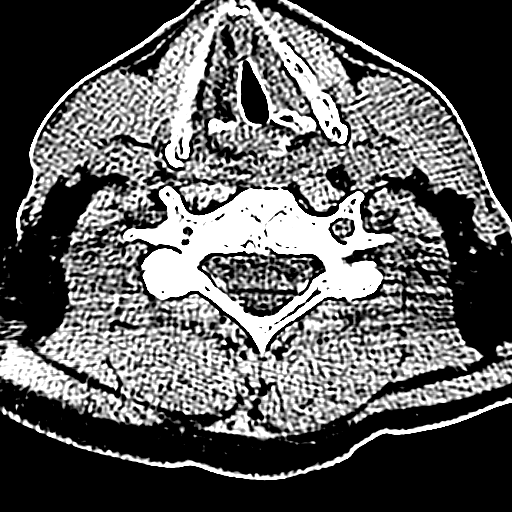
[im 61/101  brain]
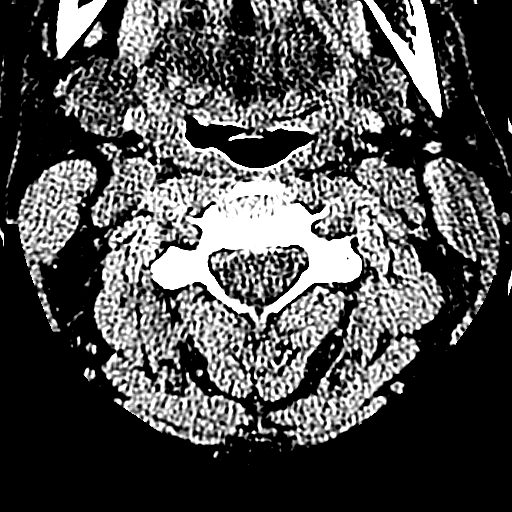
[im 61/101  bone]
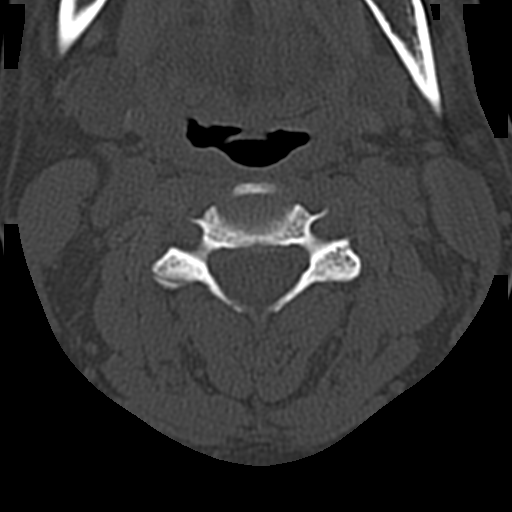
[im 71/101  brain]
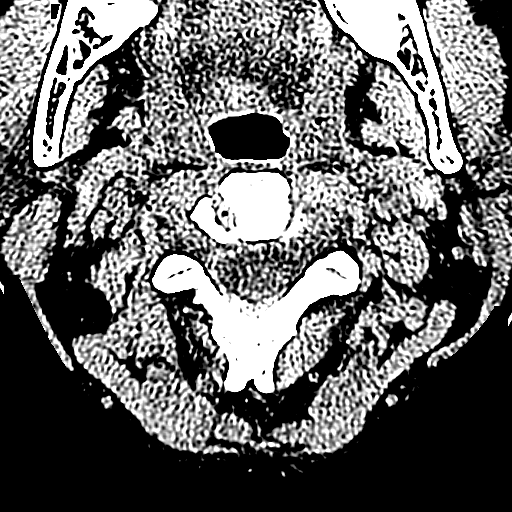
[im 81/101  brain]
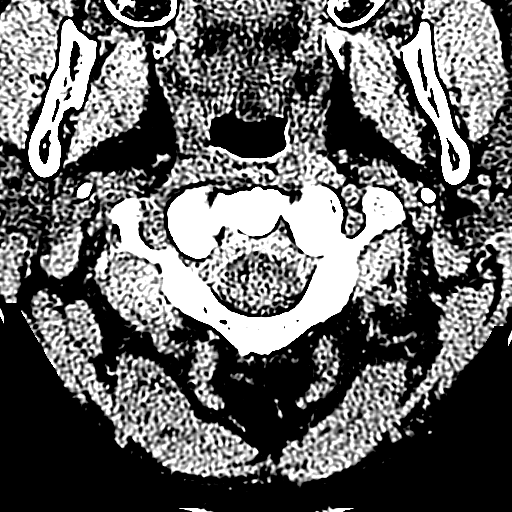
[im 91/101  brain]
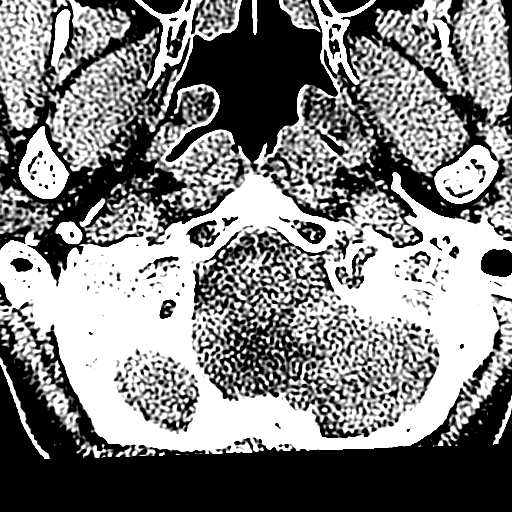

[Series 9: coronal · coronal · 0.26mm/px · 3 of 61 slices shown]
[im 21/61  brain]
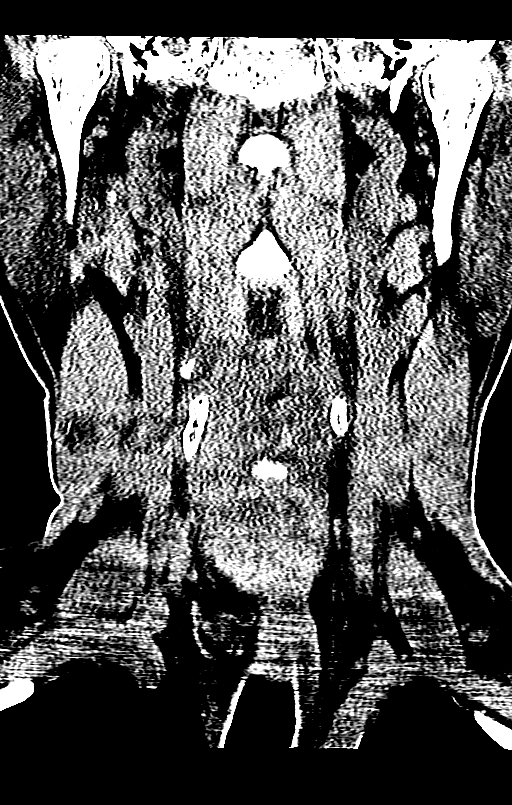
[im 27/61  brain]
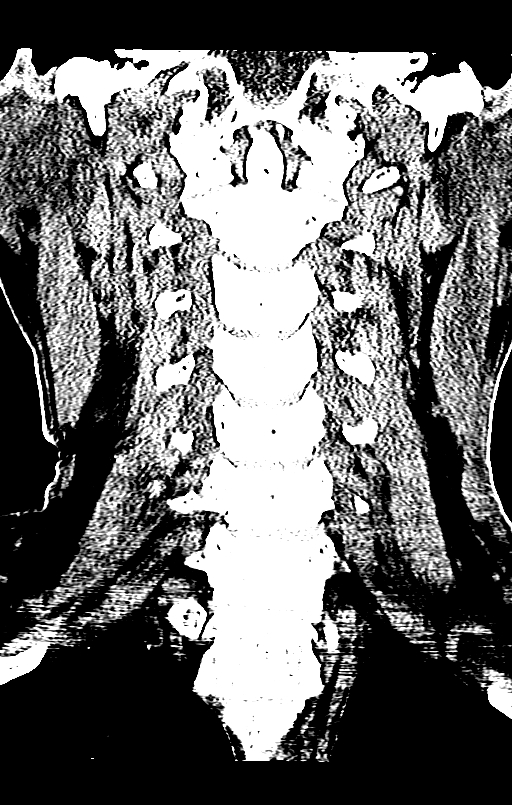
[im 34/61  brain]
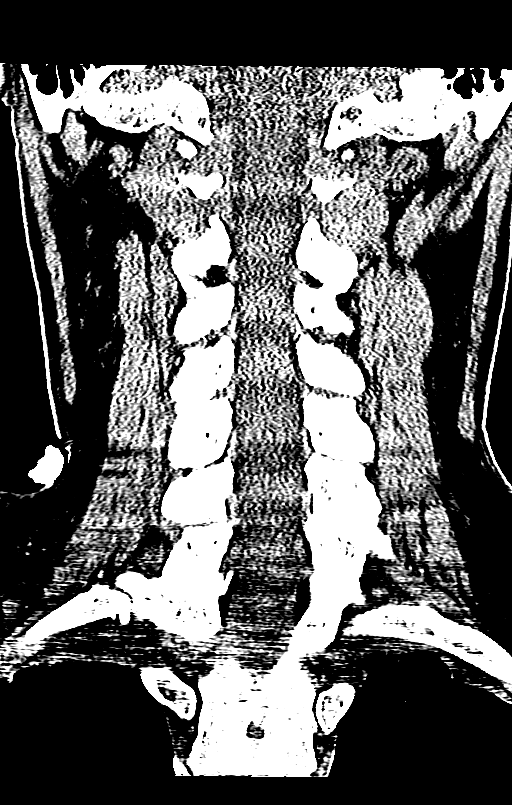

[Series 10: sagittal · sagittal · 0.26mm/px · 3 of 70 slices shown]
[im 24/70  brain]
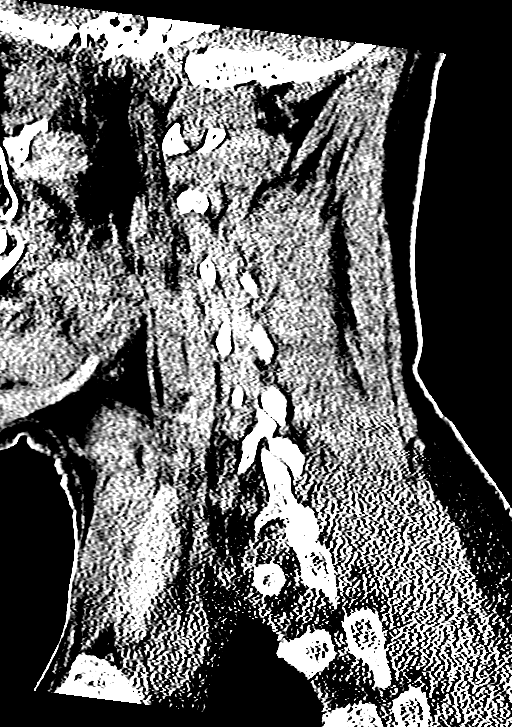
[im 35/70  brain]
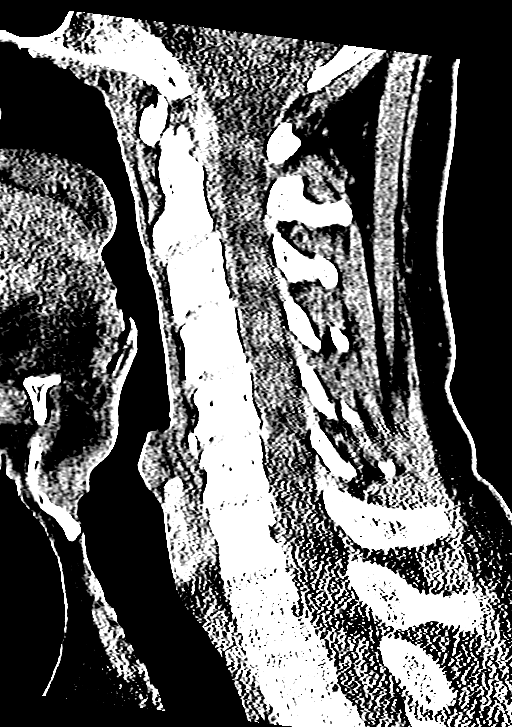
[im 47/70  brain]
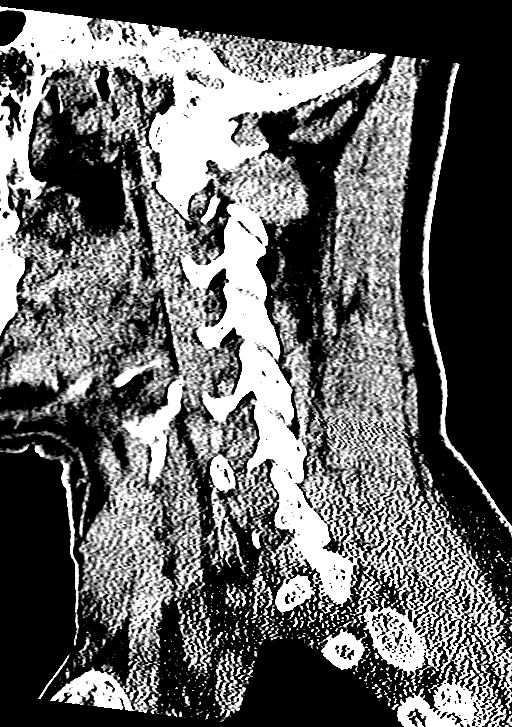

[14 of 47 positions shown; findings below may reference images not displayed]

FINDINGS: CT HEAD FINDINGS

The ventricles are normal in configuration. There is mild
ventricular and sulcal enlargement reflecting volume loss greater
than expected for a patient of this age.

There are no parenchymal masses or mass effect. There is no evidence
of infarct. No extra-axial masses or abnormal fluid collections.

There is no intracranial hemorrhage.

The visualized sinuses and mastoid air cells are clear. No skull
lesion.

CT CERVICAL SPINE FINDINGS

No fracture. No spondylolisthesis. Minor disc degenerative changes
at C4-C5 and C5-C6. No significant stenosis. Soft tissues are
unremarkable. Lung apices are clear.
IMPRESSION: HEAD CT: No acute intracranial abnormalities. Mild atrophy advanced
for age.

CERVICAL CT:  No fracture or acute finding.

## 2016-04-04 IMAGING — CR DG HIP (WITH OR WITHOUT PELVIS) 2-3V*L*
3 series · 3 of 3 positions shown · non-contrast
Comparison: 10/11/2015

CLINICAL DATA: Fall today with left hip pain, initial encounter

EXAM:
DG HIP (WITH OR WITHOUT PELVIS) 2-3V LEFT

[t pelvis ap]
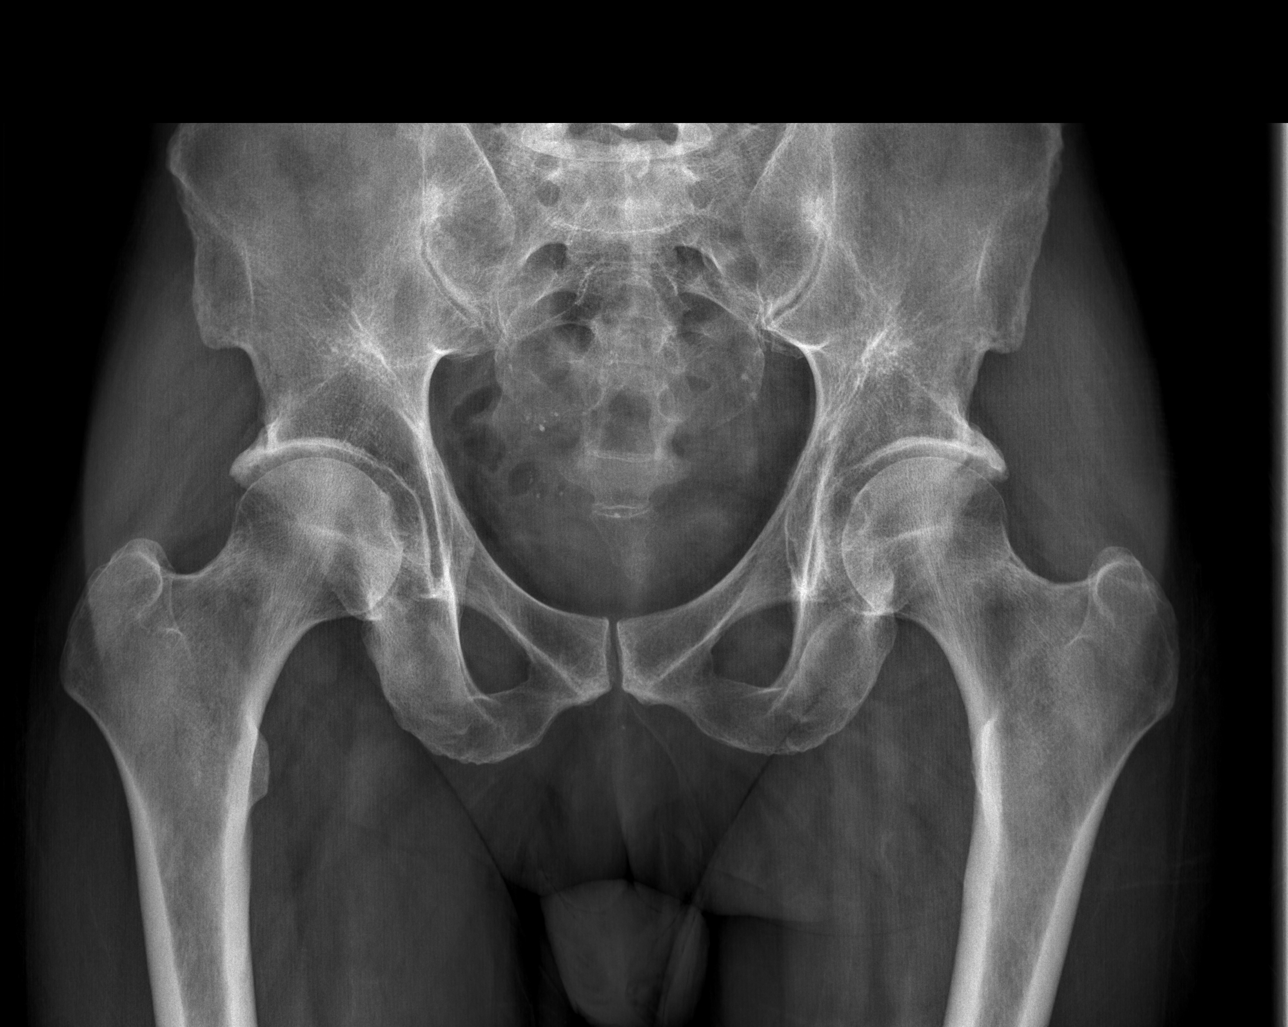

[t hip ap left]
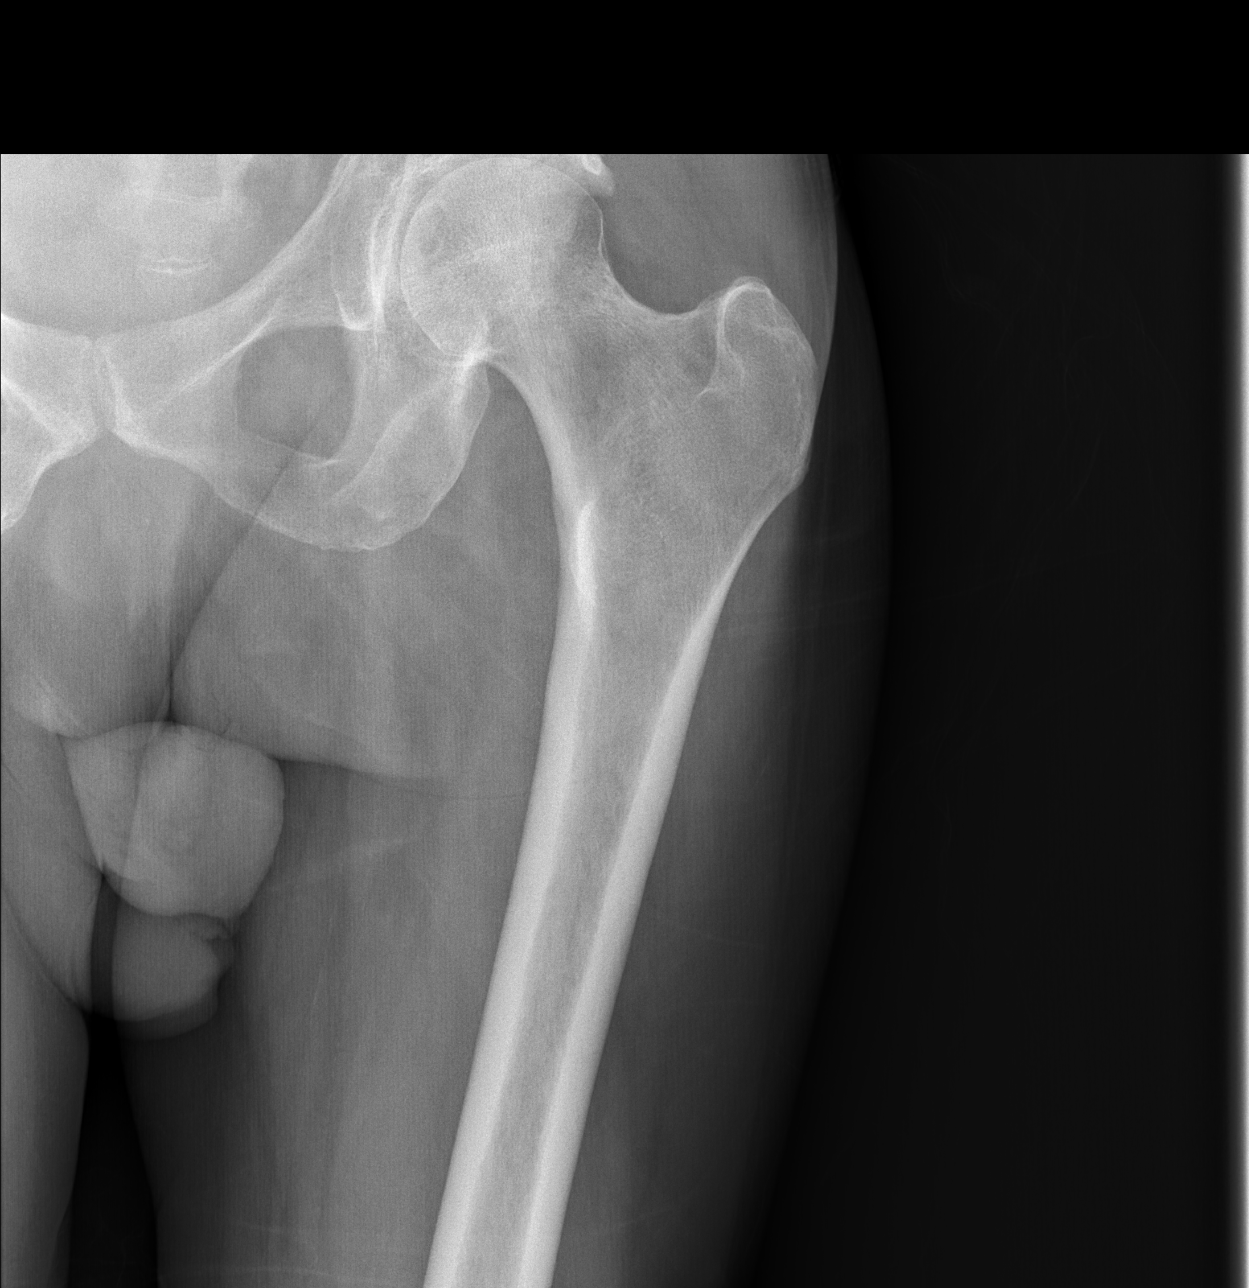

[t hip frog leg left]
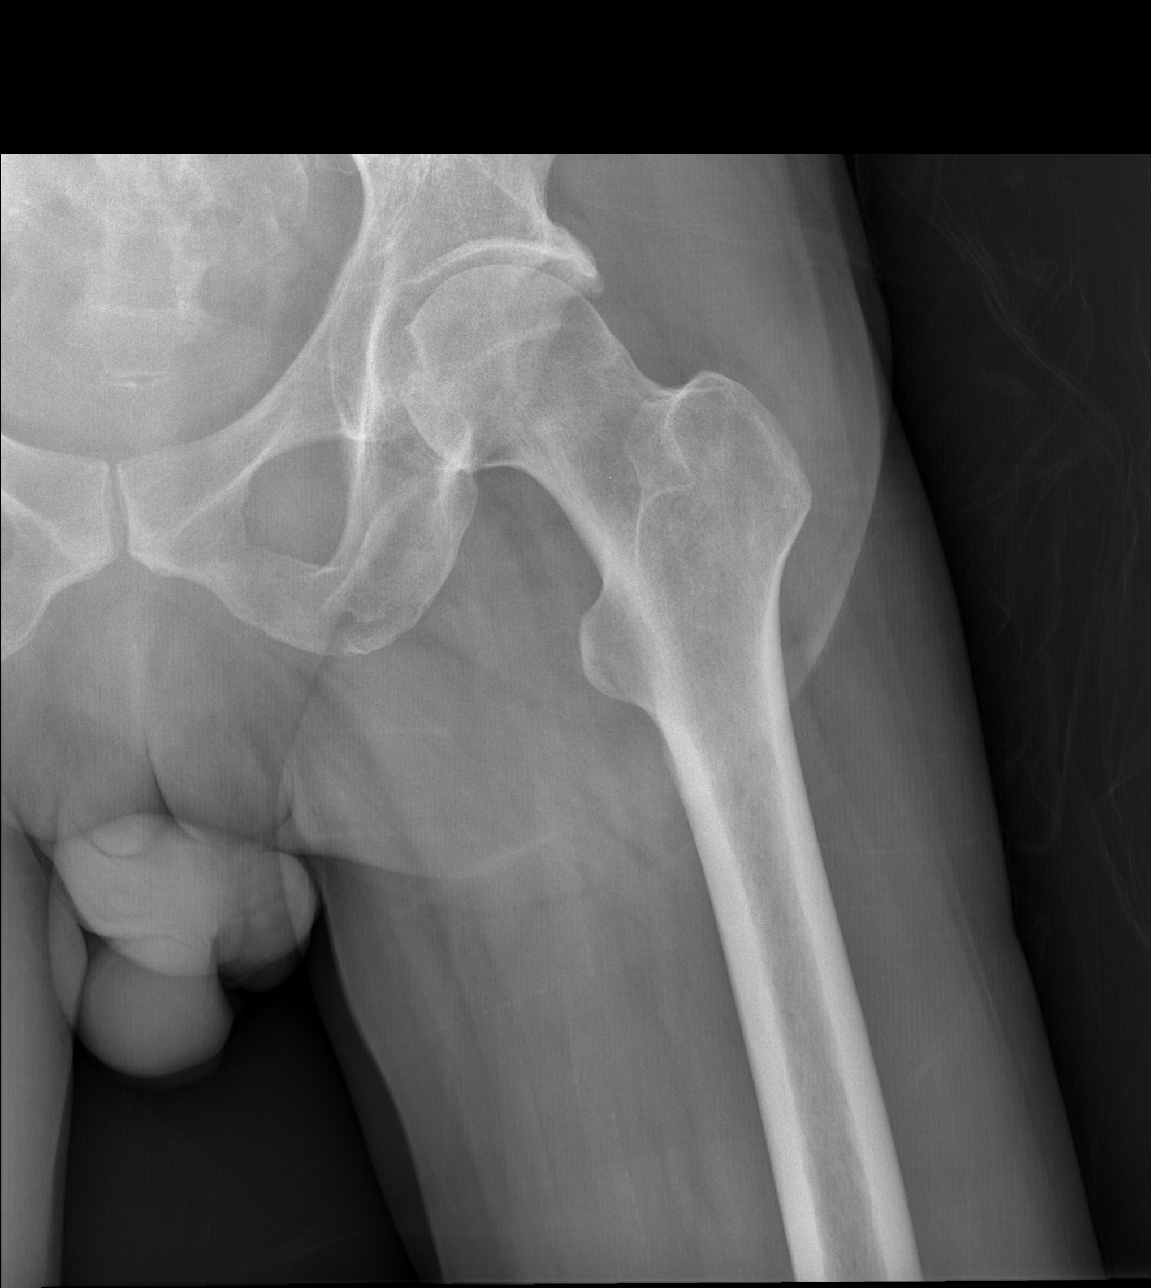

[3 of 3 positions shown; findings below may reference images not displayed]

FINDINGS: Bilateral degenerative changes of the hip joints are noted. The
pelvic ring is intact. No acute fracture or dislocation is noted. No
gross soft tissue abnormality is noted.
IMPRESSION: Degenerative change without acute abnormality.

## 2016-07-05 ENCOUNTER — Encounter (HOSPITAL_COMMUNITY): Payer: Self-pay

## 2016-07-05 ENCOUNTER — Emergency Department (HOSPITAL_COMMUNITY)
Admission: EM | Admit: 2016-07-05 | Discharge: 2016-07-05 | Disposition: A | Discharge status: Home / Self Care | Payer: Medicaid Other | Attending: Emergency Medicine | Admitting: Emergency Medicine

## 2016-07-05 DIAGNOSIS — Y990 Civilian activity done for income or pay: Secondary | ICD-10-CM | POA: Insufficient documentation

## 2016-07-05 DIAGNOSIS — Y929 Unspecified place or not applicable: Secondary | ICD-10-CM | POA: Diagnosis not present

## 2016-07-05 DIAGNOSIS — W260XXA Contact with knife, initial encounter: Secondary | ICD-10-CM | POA: Insufficient documentation

## 2016-07-05 DIAGNOSIS — Y939 Activity, unspecified: Secondary | ICD-10-CM | POA: Insufficient documentation

## 2016-07-05 DIAGNOSIS — S51812A Laceration without foreign body of left forearm, initial encounter: Secondary | ICD-10-CM | POA: Diagnosis present

## 2016-07-05 DIAGNOSIS — S41112A Laceration without foreign body of left upper arm, initial encounter: Secondary | ICD-10-CM

## 2016-07-05 MED ORDER — IBUPROFEN 800 MG PO TABS
800.0000 mg | ORAL_TABLET | Freq: Once | ORAL | Status: AC
  Administered 2016-07-05: 800 mg via ORAL
  Filled 2016-07-05: qty 1

## 2016-07-05 MED ORDER — ACETAMINOPHEN 500 MG PO TABS
1000.0000 mg | ORAL_TABLET | Freq: Once | ORAL | Status: AC
  Administered 2016-07-05: 1000 mg via ORAL
  Filled 2016-07-05: qty 2

## 2016-07-05 MED ORDER — LIDOCAINE-EPINEPHRINE (PF) 1 %-1:200000 IJ SOLN
INTRAMUSCULAR | Status: AC
  Administered 2016-07-05: 5 mL
  Filled 2016-07-05: qty 30

## 2016-07-05 MED ORDER — LIDOCAINE-EPINEPHRINE-TETRACAINE (LET) SOLUTION
3.0000 mL | Freq: Once | NASAL | Status: AC
  Administered 2016-07-05: 3 mL via TOPICAL
  Filled 2016-07-05: qty 3

## 2016-07-05 MED ORDER — LIDOCAINE-EPINEPHRINE-TETRACAINE (LET) SOLUTION: 3.0000 mL | Freq: Once | NASAL | Status: DC

## 2016-07-05 NOTE — ED Triage Notes (Addendum)
Pt presents from home with a approx 4 in laceration on his L forearm. Laceration done with a kitchen knife.  Fatty tissue is visible. A&Ox4. Ambulatory. Wound wrapped in triage.

## 2016-07-05 NOTE — ED Notes (Signed)
Pt ambulatory and independent at discharge.  Verbalized understanding of discharge instructions 

## 2016-07-05 NOTE — ED Provider Notes (Signed)
[facility] DEPT Provider Note   CSN: [id number] Arrival date & time: 07/05/16  0019  By signing my name below, I, Royce Macadamia, attest that this documentation has been prepared under the direction and in the presence of Aarica Wax, MD . Electronically Signed: Royce Macadamia, [place]. 07/05/2016. 1:38 AM.  History   Chief Complaint Chief Complaint  Patient presents with  . Laceration   The history is provided by the patient and medical records. No language interpreter was used.  Laceration   The incident occurred 1 to 2 hours ago. The laceration is located on the left arm. The laceration is 6 cm in size. The laceration mechanism was a a metal edge. The pain is mild. The pain has been constant since onset. He reports no foreign bodies present. His tetanus status is UTD.     HPI Comments:  Joseph Haney is a 40 y.o. male who presents to the Emergency Department complaining of laceration to his left forearm an hour and a half ago.  Pt was peeling potatoes and tripped and fell.  Bleeding controlled PTA with dressing.  No alleviating factor noted.  No additional injury or complaint.  Tetanus UTD.    Past Medical History:  Diagnosis Date  . Alcohol abuse   . GERD (gastroesophageal reflux disease)   . Seizure [facility])     Patient Active Problem List   Diagnosis Date Noted  . Enteritis 11/26/2015  . Hypomagnesemia 11/26/2015  . Rectal bleeding 11/26/2015  . Urinary tract infectious disease   . Hypokalemia 10/10/2015  . Alcoholic ketoacidosis [id number]  . Hyponatremia 05/21/2015  . Concussion 05/21/2015  . C. difficile diarrhea 05/21/2013  . Diarrhea 05/20/2013  . Metabolic acidosis 99991111  . Protein-calorie malnutrition, severe ([place]) 05/20/2013  . Abnormality of gait 12/08/2012  . Neck pain 10/21/2012  . Back pain 10/21/2012  . Alcohol abuse 07/25/2012  . Seizure disorder ([place]) 07/25/2012  . 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 NO PAST SURGERIES         Home Medications    Prior to Admission medications   Medication Sig Start Date End Date Taking? Authorizing Provider  carvedilol (COREG) 6.25 MG tablet Take 2 tablets (12.5 mg total) by mouth 2 (two) times daily with a meal. 10/12/15   Debbe Odea, MD  docusate sodium (COLACE) 100 MG capsule Take 100 mg by mouth daily.    Historical Provider, MD  levETIRAcetam (KEPPRA) 500 MG tablet Take 1 tablet (500 mg total) by mouth 2 (two) times daily. 10/12/15   Debbe Odea, MD  loperamide (IMODIUM) 2 MG capsule Take 1 capsule (2 mg total) by mouth as needed for diarrhea or loose stools. 11/30/15   Charlynne Cousins, MD  methocarbamol (ROBAXIN) 500 MG tablet Take 1 tablet (500 mg total) by mouth 2 (two) times daily. Patient taking differently: Take 500 mg by mouth 2 (two) times daily as needed for muscle spasms.  10/14/15   Domenic Moras, [name]  omeprazole (PRILOSEC) 20 MG capsule Take 1 capsule (20 mg total) by mouth daily. 11/20/15   Varney Biles, MD    Family History Family History  Problem Relation Age of Onset  . Coronary artery disease Mother   . Coronary artery disease Father   . Heart attack Father 27    Social History Social History  Substance Use Topics  . Smoking status: Never Smoker  . Smokeless tobacco: Never Used  . Alcohol use 0.0 oz/week  Comment: one 40 ounce a week     Allergies   Morphine and related and Other   Review of Systems Review of Systems  Constitutional: Negative for fever.  Skin: Positive for wound.  All other systems reviewed and are negative.    Physical Exam Updated Vital Signs BP 133/73 (BP Location: Right Arm)   Pulse 105   Resp 16   Ht 5\' 6"  (1.676 m)   Wt 160 lb (72.6 kg)   SpO2 100%   BMI 25.82 kg/m   Physical Exam  Constitutional: He is oriented to person, place, and time. He appears well-developed and well-nourished. No distress.  HENT:  Head: Normocephalic  and atraumatic.  Mouth/Throat: Oropharynx is clear and moist. No oropharyngeal exudate.  Moist mucous membranes   Eyes: Conjunctivae are normal. Pupils are equal, round, and reactive to light.  Neck: Normal range of motion. Neck supple. No JVD present.  Trachea midline No bruit  Cardiovascular: Normal rate, regular rhythm and normal heart sounds.   Pulmonary/Chest: Effort normal and breath sounds normal. No stridor. No respiratory distress.  Abdominal: Soft. Bowel sounds are normal. He exhibits no distension. There is no rebound and no guarding.  Musculoskeletal: Normal range of motion. He exhibits no edema or deformity.       Arms: All comparments are soft intact.  Distal pulses. 2+.  Neurological: He is alert and oriented to person, place, and time. He has normal reflexes.  Left upper extremity sensation and neurovasculature intact.    Skin: Skin is warm and dry. Capillary refill takes less than 2 seconds.  2 3/4 inch horizontal insision to the volar aspect pf the left forearm 2 inches distal of the cubital fossa.  Gaping.  2+ radial pulse. ist cupb foss gaping  Capilary reill to left digits < 2 seconds.  Superficial abrasion just distal to the left patellar.    Psychiatric: He has a normal mood and affect. His behavior is normal.  Nursing note and vitals reviewed.  ED Treatments / Results   DIAGNOSTIC STUDIES:  Oxygen Saturation is 97% on RA, NML by my interpretation.    COORDINATION OF CARE:  1:10 AM Discussed treatment plan with pt at bedside and pt agreed to plan.  Labs (all labs ordered are listed, but only abnormal results are displayed) Labs Reviewed - No data to display  Results for orders placed or performed during the hospital encounter of 11/25/15  Gastrointestinal Panel by PCR , Stool  Result Value Ref Range   Campylobacter species NOT DETECTED NOT DETECTED   Plesimonas shigelloides NOT DETECTED NOT DETECTED   Salmonella species NOT DETECTED NOT DETECTED    Yersinia enterocolitica NOT DETECTED NOT DETECTED   Vibrio species NOT DETECTED NOT DETECTED   Vibrio cholerae NOT DETECTED NOT DETECTED   Enteroaggregative E coli (EAEC) NOT DETECTED NOT DETECTED   Enteropathogenic E coli (EPEC) NOT DETECTED NOT DETECTED   Enterotoxigenic E coli (ETEC) NOT DETECTED NOT DETECTED   Shiga like toxin producing E coli (STEC) NOT DETECTED NOT DETECTED   E. coli O157 NOT DETECTED NOT DETECTED   Shigella/Enteroinvasive E coli (EIEC) NOT DETECTED NOT DETECTED   Cryptosporidium NOT DETECTED NOT DETECTED   Cyclospora cayetanensis NOT DETECTED NOT DETECTED   Entamoeba histolytica NOT DETECTED NOT DETECTED   Giardia lamblia NOT DETECTED NOT DETECTED   Adenovirus F40/41 NOT DETECTED NOT DETECTED   Astrovirus NOT DETECTED NOT DETECTED   Norovirus GI/GII NOT DETECTED NOT DETECTED   Rotavirus A NOT DETECTED NOT  DETECTED   Sapovirus (I, II, IV, and V) NOT DETECTED NOT DETECTED  C difficile quick scan w PCR reflex  Result Value Ref Range   C Diff antigen NEGATIVE NEGATIVE   C Diff toxin NEGATIVE NEGATIVE   C Diff interpretation Negative for toxigenic C. difficile   Ethanol  Result Value Ref Range   Alcohol, Ethyl (B) <5 <5 mg/dL  CBC with Differential  Result Value Ref Range   WBC 6.6 4.0 - 10.5 K/uL   RBC 3.68 (L) 4.22 - 5.81 MIL/uL   Hemoglobin 12.6 (L) 13.0 - 17.0 g/dL   HCT 34.7 (L) 39.0 - 52.0 %   MCV 94.3 78.0 - 100.0 fL   MCH 34.2 (H) 26.0 - 34.0 pg   MCHC 36.3 (H) 30.0 - 36.0 g/dL   RDW 14.2 11.5 - 15.5 %   Platelets 232 150 - 400 K/uL   Neutrophils Relative % 77 %   Neutro Abs 5.1 1.7 - 7.7 K/uL   Lymphocytes Relative 16 %   Lymphs Abs 1.0 0.7 - 4.0 K/uL   Monocytes Relative 7 %   Monocytes Absolute 0.5 0.1 - 1.0 K/uL   Eosinophils Relative 0 %   Eosinophils Absolute 0.0 0.0 - 0.7 K/uL   Basophils Relative 0 %   Basophils Absolute 0.0 0.0 - 0.1 K/uL  Comprehensive metabolic panel  Result Value Ref Range   Sodium 139 135 - 145 mmol/L    Potassium 2.5 (LL) 3.5 - 5.1 mmol/L   Chloride 99 (L) 101 - 111 mmol/L   CO2 24 22 - 32 mmol/L   Glucose, Bld 118 (H) 65 - 99 mg/dL   BUN <5 (L) 6 - 20 mg/dL   Creatinine, Ser 0.59 (L) 0.61 - 1.24 mg/dL   Calcium 9.0 8.9 - 10.3 mg/dL   Total Protein 9.2 (H) 6.5 - 8.1 g/dL   Albumin 4.6 3.5 - 5.0 g/dL   AST 143 (H) 15 - 41 U/L   ALT 28 17 - 63 U/L   Alkaline Phosphatase 111 38 - 126 U/L   Total Bilirubin 2.0 (H) 0.3 - 1.2 mg/dL   GFR calc non Af Amer >60 >60 mL/min   GFR calc Af Amer >60 >60 mL/min   Anion gap 16 (H) 5 - 15  Protime-INR  Result Value Ref Range   Prothrombin Time 14.4 11.6 - 15.2 seconds   INR 1.14 0.00 - 1.49  Urinalysis, Routine w reflex microscopic (not at [facility])  Result Value Ref Range   Color, Urine ORANGE (A) YELLOW   APPearance CLOUDY (A) CLEAR   Specific Gravity, Urine 1.040 (H) 1.005 - 1.030   pH 5.5 5.0 - 8.0   Glucose, UA NEGATIVE NEGATIVE mg/dL   Hgb urine dipstick NEGATIVE NEGATIVE   Bilirubin Urine MODERATE (A) NEGATIVE   Ketones, ur 40 (A) NEGATIVE mg/dL   Protein, ur 100 (A) NEGATIVE mg/dL   Nitrite POSITIVE (A) NEGATIVE   Leukocytes, UA SMALL (A) NEGATIVE  Magnesium  Result Value Ref Range   Magnesium 1.2 (L) 1.7 - 2.4 mg/dL  Lipase, blood  Result Value Ref Range   Lipase 35 11 - 51 U/L  Phosphorus  Result Value Ref Range   Phosphorus 2.4 (L) 2.5 - 4.6 mg/dL  Urine microscopic-add on  Result Value Ref Range   Squamous Epithelial / LPF 0-5 (A) NONE SEEN   WBC, UA 6-30 0 - 5 WBC/hpf   RBC / HPF 0-5 0 - 5 RBC/hpf   Bacteria, UA RARE (A)  NONE SEEN   Urine-Other MUCOUS PRESENT   Phosphorus  Result Value Ref Range   Phosphorus 2.4 (L) 2.5 - 4.6 mg/dL  CBC WITH DIFFERENTIAL  Result Value Ref Range   WBC 5.0 4.0 - 10.5 K/uL   RBC 3.12 (L) 4.22 - 5.81 MIL/uL   Hemoglobin 10.8 (L) 13.0 - 17.0 g/dL   HCT 30.2 (L) 39.0 - 52.0 %   MCV 96.8 78.0 - 100.0 fL   MCH 34.6 (H) 26.0 - 34.0 pg   MCHC 35.8 30.0 - 36.0 g/dL   RDW 14.3 11.5 - 15.5 %     Platelets 181 150 - 400 K/uL   Neutrophils Relative % 60 %   Neutro Abs 3.0 1.7 - 7.7 K/uL   Lymphocytes Relative 29 %   Lymphs Abs 1.4 0.7 - 4.0 K/uL   Monocytes Relative 9 %   Monocytes Absolute 0.4 0.1 - 1.0 K/uL   Eosinophils Relative 1 %   Eosinophils Absolute 0.0 0.0 - 0.7 K/uL   Basophils Relative 1 %   Basophils Absolute 0.0 0.0 - 0.1 K/uL  Comprehensive metabolic panel  Result Value Ref Range   Sodium 139 135 - 145 mmol/L   Potassium 2.6 (LL) 3.5 - 5.1 mmol/L   Chloride 104 101 - 111 mmol/L   CO2 24 22 - 32 mmol/L   Glucose, Bld 97 65 - 99 mg/dL   BUN <5 (L) 6 - 20 mg/dL   Creatinine, Ser 0.55 (L) 0.61 - 1.24 mg/dL   Calcium 7.7 (L) 8.9 - 10.3 mg/dL   Total Protein 7.0 6.5 - 8.1 g/dL   Albumin 3.3 (L) 3.5 - 5.0 g/dL   AST 84 (H) 15 - 41 U/L   ALT 20 17 - 63 U/L   Alkaline Phosphatase 89 38 - 126 U/L   Total Bilirubin 1.5 (H) 0.3 - 1.2 mg/dL   GFR calc non Af Amer >60 >60 mL/min   GFR calc Af Amer >60 >60 mL/min   Anion gap 11 5 - 15  CBC WITH DIFFERENTIAL  Result Value Ref Range   WBC 4.1 4.0 - 10.5 K/uL   RBC 2.85 (L) 4.22 - 5.81 MIL/uL   Hemoglobin 9.9 (L) 13.0 - 17.0 g/dL   HCT 28.7 (L) 39.0 - 52.0 %   MCV 100.7 (H) 78.0 - 100.0 fL   MCH 34.7 (H) 26.0 - 34.0 pg   MCHC 34.5 30.0 - 36.0 g/dL   RDW 14.9 11.5 - 15.5 %   Platelets 183 150 - 400 K/uL   Neutrophils Relative % 64 %   Neutro Abs 2.6 1.7 - 7.7 K/uL   Lymphocytes Relative 24 %   Lymphs Abs 1.0 0.7 - 4.0 K/uL   Monocytes Relative 10 %   Monocytes Absolute 0.4 0.1 - 1.0 K/uL   Eosinophils Relative 2 %   Eosinophils Absolute 0.1 0.0 - 0.7 K/uL   Basophils Relative 0 %   Basophils Absolute 0.0 0.0 - 0.1 K/uL  Comprehensive metabolic panel  Result Value Ref Range   Sodium 142 135 - 145 mmol/L   Potassium 4.4 3.5 - 5.1 mmol/L   Chloride 114 (H) 101 - 111 mmol/L   CO2 22 22 - 32 mmol/L   Glucose, Bld 90 65 - 99 mg/dL   BUN <5 (L) 6 - 20 mg/dL   Creatinine, Ser 0.57 (L) 0.61 - 1.24 mg/dL    Calcium 8.1 (L) 8.9 - 10.3 mg/dL   Total Protein 6.5 6.5 - 8.1 g/dL  Albumin 3.2 (L) 3.5 - 5.0 g/dL   AST 81 (H) 15 - 41 U/L   ALT 19 17 - 63 U/L   Alkaline Phosphatase 73 38 - 126 U/L   Total Bilirubin 0.8 0.3 - 1.2 mg/dL   GFR calc non Af Amer >60 >60 mL/min   GFR calc Af Amer >60 >60 mL/min   Anion gap 6 5 - 15  Basic metabolic panel  Result Value Ref Range   Sodium 142 135 - 145 mmol/L   Potassium 3.9 3.5 - 5.1 mmol/L   Chloride 114 (H) 101 - 111 mmol/L   CO2 24 22 - 32 mmol/L   Glucose, Bld 65 65 - 99 mg/dL   BUN <5 (L) 6 - 20 mg/dL   Creatinine, Ser 0.49 (L) 0.61 - 1.24 mg/dL   Calcium 8.2 (L) 8.9 - 10.3 mg/dL   GFR calc non Af Amer >60 >60 mL/min   GFR calc Af Amer >60 >60 mL/min   Anion gap 4 (L) 5 - 15  CBC  Result Value Ref Range   WBC 4.8 4.0 - 10.5 K/uL   RBC 2.99 (L) 4.22 - 5.81 MIL/uL   Hemoglobin 10.2 (L) 13.0 - 17.0 g/dL   HCT 29.5 (L) 39.0 - 52.0 %   MCV 98.7 78.0 - 100.0 fL   MCH 34.1 (H) 26.0 - 34.0 pg   MCHC 34.6 30.0 - 36.0 g/dL   RDW 14.6 11.5 - 15.5 %   Platelets 202 150 - 400 K/uL  Magnesium  Result Value Ref Range   Magnesium 1.5 (L) 1.7 - 2.4 mg/dL  Phosphorus  Result Value Ref Range   Phosphorus 2.5 2.5 - 4.6 mg/dL  CBG monitoring, ED  Result Value Ref Range   Glucose-Capillary 122 (H) 65 - 99 mg/dL  POC occult blood, ED Provider will collect  Result Value Ref Range   Fecal Occult Bld POSITIVE (A) NEGATIVE   No results found.  EKG  EKG Interpretation None       Radiology No results found.  Procedures .Marland KitchenLaceration Repair Date/Time: 07/05/2016 2:36 AM Performed by: Veatrice Kells Authorized by: Veatrice Kells   Consent:    Consent obtained:  Verbal   Consent given by:  Patient   Risks discussed:  Infection   Alternatives discussed:  No treatment Anesthesia (see MAR for exact dosages):    Anesthesia method:  Topical application and local infiltration   Topical anesthetic:  LET   Local anesthetic:  Lidocaine 2% WITH  epi Laceration details:    Location: volar forearm.   Length (cm):  6   Depth (mm):  2 Repair type:    Repair type:  Intermediate Pre-procedure details:    Preparation:  Patient was prepped and draped in usual sterile fashion Exploration:    Hemostasis achieved with:  Direct pressure, LET and epinephrine   Wound exploration: wound explored through full range of motion and entire depth of wound probed and visualized     Wound extent: no foreign bodies/material noted, no nerve damage noted, no tendon damage noted, no underlying fracture noted and no vascular damage noted     Contaminated: no   Treatment:    Area cleansed with:  Saline and Betadine   Amount of cleaning:  Standard   Irrigation solution:  Sterile saline   Irrigation method:  Tap   Visualized foreign bodies/material removed: no   Skin repair:    Repair method:  Staples   Number of staples:  12 Approximation:  Approximation:  Close   Vermilion border: well-aligned   Post-procedure details:    Dressing:  Sterile dressing   Patient tolerance of procedure:  Tolerated well, no immediate complications Comments:     Staple removal in 12 days at urgent care   (including critical care time)  Medications Ordered in ED Medications  lidocaine-EPINEPHrine-tetracaine (LET) solution (3 mLs Topical Given 07/05/16 0116)  acetaminophen (TYLENOL) tablet 1,000 mg (1,000 mg Oral Given 07/05/16 0117)  ibuprofen (ADVIL,MOTRIN) tablet 800 mg (800 mg Oral Given 07/05/16 0117)     Final Clinical Impressions(s) / ED Diagnoses  Laceration: All questions answered to patient's satisfaction. Based on history and exam patient has been appropriately medically screened and emergency conditions excluded. Patient is stable for discharge at this time. Follow up with your PMD for recheck in 2 days and strict return precautions given. Staple removal in 12 days at urgent care   New Prescriptions New Prescriptions   No medications on file   I  personally performed the services described in this documentation, which was scribed in my presence. The recorded information has been reviewed and is accurate.     Veatrice Kells, MD 07/05/16 [phone]

## 2016-07-19 ENCOUNTER — Ambulatory Visit: Payer: Self-pay

## 2016-07-22 ENCOUNTER — Ambulatory Visit (INDEPENDENT_AMBULATORY_CARE_PROVIDER_SITE_OTHER): Payer: Self-pay | Admitting: Physician Assistant

## 2016-07-22 ENCOUNTER — Encounter: Payer: Self-pay | Admitting: Physician Assistant

## 2016-07-22 ENCOUNTER — Encounter (HOSPITAL_COMMUNITY): Payer: Self-pay | Admitting: Emergency Medicine

## 2016-07-22 ENCOUNTER — Emergency Department (HOSPITAL_COMMUNITY)
Admission: EM | Admit: 2016-07-22 | Discharge: 2016-07-22 | Disposition: A | Discharge status: Home / Self Care | Payer: Medicaid Other | Attending: Emergency Medicine | Admitting: Emergency Medicine

## 2016-07-22 VITALS — BP 124/74 | HR 103 | Temp 98.3°F | Resp 17 | Ht 68.5 in | Wt 170.0 lb

## 2016-07-22 DIAGNOSIS — Z4802 Encounter for removal of sutures: Secondary | ICD-10-CM | POA: Insufficient documentation

## 2016-07-22 DIAGNOSIS — Z5321 Procedure and treatment not carried out due to patient leaving prior to being seen by health care provider: Secondary | ICD-10-CM

## 2016-07-22 MED ORDER — BACITRACIN ZINC 500 UNIT/GM EX OINT
TOPICAL_OINTMENT | Freq: Two times a day (BID) | CUTANEOUS | Status: DC
  Administered 2016-07-22: 1 via TOPICAL

## 2016-07-22 NOTE — Discharge Instructions (Signed)
Keep wound clean with mild soap and water. Keep area covered with a topical antibiotic ointment and bandage until the scabs fully heal. Protect the scar from the sun for the next 1 year, using sunscreen whenever you're out in the sun. Follow up with your primary care doctor for recheck of wound in the next 1 week. Monitor area for signs of infection to include, but not limited to: increasing pain, spreading redness, drainage/pus, worsening swelling, or fevers. Return to emergency department for emergent changing or worsening symptoms.

## 2016-07-22 NOTE — ED Triage Notes (Signed)
Pt presents with stapled wound left forearm, placed 2 weeks ago. Intact wound edges. No bleeding nor drainage .

## 2016-07-22 NOTE — Progress Notes (Signed)
I did not personally examine this patient.  

## 2016-07-22 NOTE — Patient Instructions (Signed)
     IF you received an x-ray today, you will receive an invoice from [facility]. Please contact [facility] at [phone] with questions or concerns regarding your invoice.   IF you received labwork today, you will receive an invoice from [facility]. Please contact [facility] at [phone] with questions or concerns regarding your invoice.   Our billing staff will not be able to assist you with questions regarding bills from these companies.  You will be contacted with the lab results as soon as they are available. The fastest way to get your results is to activate your My Chart account. Instructions are located on the last page of this paperwork. If you have not heard from us regarding the results in 2 weeks, please contact this office.      

## 2016-07-22 NOTE — ED Provider Notes (Signed)
[facility] DEPT Provider Note   CSN: [id number] Arrival date & time: 07/22/16  1023     History   Chief Complaint Chief Complaint  Patient presents with  . Suture / Staple Removal    HPI Joseph Haney is a 40 y.o. male with a PMHx of alcohol abuse, GERD, and seizures, who presents to the ED for staple removal. Pt was seen 07/05/16 in the ER for L forearm lac, had 12 staples placed. Patient states the wound is healing well, pain is improving, he denies any drainage, erythema, warmth, or any other issues. States that the staples are intact and the wound edges have healed well. He denies any fevers, chills, CP, SOB, abd pain, N/V/D/C, hematuria, dysuria, myalgias, arthralgias, numbness, tingling, weakness, or any other symptoms.    The history is provided by the patient and medical records. No language interpreter was used.  Suture / Staple Removal  This is a new problem. The current episode started more than 1 week ago. The problem occurs rarely. The problem has been resolved. Pertinent negatives include no chest pain, no abdominal pain and no shortness of breath. Nothing aggravates the symptoms. Nothing relieves the symptoms. He has tried nothing for the symptoms. The treatment provided no relief.    Past Medical History:  Diagnosis Date  . Alcohol abuse   . GERD (gastroesophageal reflux disease)   . Seizure [facility])     Patient Active Problem List   Diagnosis Date Noted  . Enteritis 11/26/2015  . Hypomagnesemia 11/26/2015  . Rectal bleeding 11/26/2015  . Urinary tract infectious disease   . Hypokalemia 10/10/2015  . Alcoholic ketoacidosis [id number]  . Hyponatremia 05/21/2015  . Concussion 05/21/2015  . C. difficile diarrhea 05/21/2013  . Diarrhea 05/20/2013  . Metabolic acidosis 99991111  . Protein-calorie malnutrition, severe ([place]) 05/20/2013  . Abnormality of gait 12/08/2012  . Neck pain 10/21/2012  . Back pain 10/21/2012  . Alcohol abuse 07/25/2012  . Seizure  disorder ([place]) 07/25/2012  . 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 NO PAST SURGERIES         Home Medications    Prior to Admission medications   Medication Sig Start Date End Date Taking? Authorizing Provider  carvedilol (COREG) 6.25 MG tablet Take 2 tablets (12.5 mg total) by mouth 2 (two) times daily with a meal. 10/12/15   Debbe Odea, MD  docusate sodium (COLACE) 100 MG capsule Take 100 mg by mouth daily.    Historical Provider, MD  levETIRAcetam (KEPPRA) 500 MG tablet Take 1 tablet (500 mg total) by mouth 2 (two) times daily. 10/12/15   Debbe Odea, MD  loperamide (IMODIUM) 2 MG capsule Take 1 capsule (2 mg total) by mouth as needed for diarrhea or loose stools. 11/30/15   Charlynne Cousins, MD  methocarbamol (ROBAXIN) 500 MG tablet Take 1 tablet (500 mg total) by mouth 2 (two) times daily. Patient taking differently: Take 500 mg by mouth 2 (two) times daily as needed for muscle spasms.  10/14/15   Domenic Moras, [name]  omeprazole (PRILOSEC) 20 MG capsule Take 1 capsule (20 mg total) by mouth daily. 11/20/15   Varney Biles, MD    Family History Family History  Problem Relation Age of Onset  . Coronary artery disease Mother   . Coronary artery disease Father   . Heart attack Father 33    Social History Social History  Substance Use Topics  . Smoking status: Never Smoker  .  Smokeless tobacco: Never Used  . Alcohol use 0.0 oz/week     Comment: one 40 ounce a week     Allergies   Morphine and related and Other   Review of Systems Review of Systems  Constitutional: Negative for chills and fever.  Respiratory: Negative for shortness of breath.   Cardiovascular: Negative for chest pain.  Gastrointestinal: Negative for abdominal pain, constipation, diarrhea, nausea and vomiting.  Genitourinary: Negative for dysuria and hematuria.  Musculoskeletal: Negative for arthralgias and myalgias.  Skin: Positive for  wound (well-healed L forearm wound). Negative for color change.  Allergic/Immunologic: Negative for immunocompromised state.  Neurological: Negative for weakness and numbness.  Psychiatric/Behavioral: Negative for confusion.   10 Systems reviewed and are negative for acute change except as noted in the HPI.   Physical Exam Updated Vital Signs BP (!) 146/102   Pulse 94   Temp 97.4 F (36.3 C) (Oral)   Resp 18   SpO2 100%   Physical Exam  Constitutional: He is oriented to person, place, and time. Vital signs are normal. He appears well-developed and well-nourished.  Non-toxic appearance. No distress.  Afebrile, nontoxic, NAD  HENT:  Head: Normocephalic and atraumatic.  Mouth/Throat: Mucous membranes are normal.  Eyes: Conjunctivae and EOM are normal. Right eye exhibits no discharge. Left eye exhibits no discharge.  Neck: Normal range of motion. Neck supple.  Cardiovascular: Normal rate and intact distal pulses.   Pulmonary/Chest: Effort normal. No respiratory distress.  Abdominal: Normal appearance. He exhibits no distension.  Musculoskeletal: Normal range of motion.  MAE x4 Strength and sensation grossly intact in all extremities Well healed L forearm wound with 12 staples intact, mildly dry flaked skin under staples but wound edges well healed; no drainage, warmth, erythema, or surrounding cellulitis. No signs of infection.  Soft compartments Distal pulses intact  Neurological: He is alert and oriented to person, place, and time. He has normal strength. No sensory deficit.  Skin: Skin is warm, dry and intact. No rash noted.  L forearm stapled well healed wound as mentioned above  Psychiatric: He has a normal mood and affect.  Nursing note and vitals reviewed.    ED Treatments / Results  Labs (all labs ordered are listed, but only abnormal results are displayed) Labs Reviewed - No data to display  EKG  EKG Interpretation None       Radiology No results  found.  Procedures .Suture Removal Date/Time: 07/22/2016 11:56 AM Performed by: Shann Medal Zariana Strub Authorized by: Zacarias Pontes   Consent:    Consent obtained:  Verbal   Consent given by:  Patient   Risks discussed:  Bleeding and pain   Alternatives discussed:  Alternative treatment and referral Location:    Location:  Upper extremity   Upper extremity location:  Arm   Arm location:  L lower arm Procedure details:    Wound appearance:  Good wound healing   Number of staples removed:  12 Post-procedure details:    Post-removal:  Antibiotic ointment applied and Band-Aid applied   Patient tolerance of procedure:  Tolerated well, no immediate complications    (including critical care time)  Medications Ordered in ED Medications - No data to display   Initial Impression / Assessment and Plan / ED Course  I have reviewed the triage vital signs and the nursing notes.  Pertinent labs & imaging results that were available during my care of the patient were reviewed by me and considered in my medical decision making (see chart for details).  Clinical Course     40 y.o. male here for staple removal from L forearm wound. Wound appears well healed, some dried flaking skin under staples but wound edges well healed. Staples removed without incident. Discussed wound care and sunscreen x71yr, and f/up with PCP in 1wk for recheck. I explained the diagnosis and have given explicit precautions to return to the ER including for any other new or worsening symptoms. The patient understands and accepts the medical plan as it's been dictated and I have answered their questions. Discharge instructions concerning home care and prescriptions have been given. The patient is STABLE and is discharged to home in good condition.   Final Clinical Impressions(s) / ED Diagnoses   Final diagnoses:  Encounter for staple removal    New Prescriptions New Prescriptions   No medications on file      Zacarias Pontes, [name] 07/22/16 1203    Lacretia Leigh, MD 07/23/16 1025

## 2021-07-19 ENCOUNTER — Emergency Department (HOSPITAL_BASED_OUTPATIENT_CLINIC_OR_DEPARTMENT_OTHER): Payer: Medicaid Other

## 2021-07-19 ENCOUNTER — Other Ambulatory Visit: Payer: Self-pay

## 2021-07-19 ENCOUNTER — Inpatient Hospital Stay (HOSPITAL_BASED_OUTPATIENT_CLINIC_OR_DEPARTMENT_OTHER)
Admission: EM | Admit: 2021-07-19 | Discharge: 2021-08-01 | DRG: 330 | Disposition: A | Discharge status: Home / Self Care | Payer: Medicaid Other | Attending: Internal Medicine | Admitting: Internal Medicine

## 2021-07-19 ENCOUNTER — Encounter (HOSPITAL_BASED_OUTPATIENT_CLINIC_OR_DEPARTMENT_OTHER): Payer: Self-pay | Admitting: *Deleted

## 2021-07-19 DIAGNOSIS — D72829 Elevated white blood cell count, unspecified: Secondary | ICD-10-CM | POA: Diagnosis present

## 2021-07-19 DIAGNOSIS — C772 Secondary and unspecified malignant neoplasm of intra-abdominal lymph nodes: Secondary | ICD-10-CM | POA: Diagnosis present

## 2021-07-19 DIAGNOSIS — C182 Malignant neoplasm of ascending colon: Secondary | ICD-10-CM | POA: Diagnosis present

## 2021-07-19 DIAGNOSIS — K6389 Other specified diseases of intestine: Secondary | ICD-10-CM | POA: Diagnosis present

## 2021-07-19 DIAGNOSIS — C19 Malignant neoplasm of rectosigmoid junction: Principal | ICD-10-CM | POA: Diagnosis present

## 2021-07-19 DIAGNOSIS — K9189 Other postprocedural complications and disorders of digestive system: Secondary | ICD-10-CM | POA: Diagnosis not present

## 2021-07-19 DIAGNOSIS — D649 Anemia, unspecified: Secondary | ICD-10-CM

## 2021-07-19 DIAGNOSIS — G40909 Epilepsy, unspecified, not intractable, without status epilepticus: Secondary | ICD-10-CM | POA: Diagnosis present

## 2021-07-19 DIAGNOSIS — C189 Malignant neoplasm of colon, unspecified: Secondary | ICD-10-CM | POA: Diagnosis present

## 2021-07-19 DIAGNOSIS — K219 Gastro-esophageal reflux disease without esophagitis: Secondary | ICD-10-CM | POA: Diagnosis present

## 2021-07-19 DIAGNOSIS — Z885 Allergy status to narcotic agent status: Secondary | ICD-10-CM

## 2021-07-19 DIAGNOSIS — K632 Fistula of intestine: Secondary | ICD-10-CM | POA: Diagnosis present

## 2021-07-19 DIAGNOSIS — F1010 Alcohol abuse, uncomplicated: Secondary | ICD-10-CM | POA: Diagnosis present

## 2021-07-19 DIAGNOSIS — K648 Other hemorrhoids: Secondary | ICD-10-CM | POA: Diagnosis present

## 2021-07-19 DIAGNOSIS — D500 Iron deficiency anemia secondary to blood loss (chronic): Secondary | ICD-10-CM | POA: Diagnosis present

## 2021-07-19 DIAGNOSIS — D630 Anemia in neoplastic disease: Secondary | ICD-10-CM | POA: Diagnosis present

## 2021-07-19 DIAGNOSIS — Z79899 Other long term (current) drug therapy: Secondary | ICD-10-CM

## 2021-07-19 DIAGNOSIS — Z20822 Contact with and (suspected) exposure to covid-19: Secondary | ICD-10-CM | POA: Diagnosis present

## 2021-07-19 DIAGNOSIS — I10 Essential (primary) hypertension: Secondary | ICD-10-CM | POA: Diagnosis present

## 2021-07-19 DIAGNOSIS — C7989 Secondary malignant neoplasm of other specified sites: Secondary | ICD-10-CM | POA: Diagnosis present

## 2021-07-19 DIAGNOSIS — K567 Ileus, unspecified: Secondary | ICD-10-CM | POA: Diagnosis not present

## 2021-07-19 DIAGNOSIS — Z8249 Family history of ischemic heart disease and other diseases of the circulatory system: Secondary | ICD-10-CM

## 2021-07-19 DIAGNOSIS — K56699 Other intestinal obstruction unspecified as to partial versus complete obstruction: Secondary | ICD-10-CM

## 2021-07-19 LAB — COMPREHENSIVE METABOLIC PANEL
ALT: 9 U/L (ref 0–44)
AST: 14 U/L — ABNORMAL LOW (ref 15–41)
Albumin: 4.3 g/dL (ref 3.5–5.0)
Alkaline Phosphatase: 69 U/L (ref 38–126)
Anion gap: 10 (ref 5–15)
BUN: 15 mg/dL (ref 6–20)
CO2: 25 mmol/L (ref 22–32)
Calcium: 9.2 mg/dL (ref 8.9–10.3)
Chloride: 101 mmol/L (ref 98–111)
Creatinine, Ser: 0.99 mg/dL (ref 0.61–1.24)
GFR, Estimated: >60 mL/min (ref >60)
Glucose, Bld: 105 mg/dL — ABNORMAL HIGH (ref 70–99)
Potassium: 3.9 mmol/L (ref 3.5–5.1)
Sodium: 136 mmol/L (ref 135–145)
Total Bilirubin: 0.4 mg/dL (ref 0.3–1.2)
Total Protein: 9.1 g/dL — ABNORMAL HIGH (ref 6.5–8.1)

## 2021-07-19 LAB — LIPASE, BLOOD: Lipase: 55 U/L — ABNORMAL HIGH (ref 11–51)

## 2021-07-19 LAB — CBC
HCT: 26.1 % — ABNORMAL LOW (ref 39.0–52.0)
Hemoglobin: 7 g/dL — ABNORMAL LOW (ref 13.0–17.0)
MCH: 17.2 pg — ABNORMAL LOW (ref 26.0–34.0)
MCHC: 26.8 g/dL — ABNORMAL LOW (ref 30.0–36.0)
MCV: 64 fL — ABNORMAL LOW (ref 80.0–100.0)
Platelets: 621 10*3/uL — ABNORMAL HIGH (ref 150–400)
RBC: 4.08 MIL/uL — ABNORMAL LOW (ref 4.22–5.81)
RDW: 18.2 % — ABNORMAL HIGH (ref 11.5–15.5)
WBC: 13.2 10*3/uL — ABNORMAL HIGH (ref 4.0–10.5)
nRBC: 0 % (ref 0.0–0.2)

## 2021-07-19 LAB — URINALYSIS, ROUTINE W REFLEX MICROSCOPIC
Bilirubin Urine: NEGATIVE
Glucose, UA: NEGATIVE mg/dL
Hgb urine dipstick: NEGATIVE
Ketones, ur: NEGATIVE mg/dL
Leukocytes,Ua: NEGATIVE
Nitrite: NEGATIVE
Protein, ur: NEGATIVE mg/dL
Specific Gravity, Urine: 1.005 (ref 1.005–1.030)
pH: 6.5 (ref 5.0–8.0)

## 2021-07-19 LAB — RESP PANEL BY RT-PCR (FLU A&B, COVID) ARPGX2
Influenza A by PCR: NEGATIVE
Influenza B by PCR: NEGATIVE
SARS Coronavirus 2 by RT PCR: NEGATIVE

## 2021-07-19 LAB — OCCULT BLOOD X 1 CARD TO LAB, STOOL: Fecal Occult Bld: NEGATIVE

## 2021-07-19 MED ORDER — HYDROMORPHONE HCL 1 MG/ML IJ SOLN
1.0000 mg | Freq: Once | INTRAMUSCULAR | Status: AC
  Administered 2021-07-19: 1 mg via INTRAVENOUS
  Filled 2021-07-19: qty 1

## 2021-07-19 MED ORDER — ONDANSETRON HCL 4 MG/2ML IJ SOLN
4.0000 mg | Freq: Once | INTRAMUSCULAR | Status: AC
  Administered 2021-07-19: 4 mg via INTRAVENOUS
  Filled 2021-07-19: qty 2

## 2021-07-19 MED ORDER — IOHEXOL 300 MG/ML  SOLN
100.0000 mL | Freq: Once | INTRAMUSCULAR | Status: AC | PRN
  Administered 2021-07-19: 100 mL via INTRAVENOUS

## 2021-07-19 MED ORDER — SODIUM CHLORIDE 0.9 % IV SOLN
Freq: Once | INTRAVENOUS | Status: AC
Start: 2021-07-19 — End: 2021-07-20

## 2021-07-19 MED ORDER — SODIUM CHLORIDE 0.9 % IV BOLUS
1000.0000 mL | Freq: Once | INTRAVENOUS | Status: AC
  Administered 2021-07-19: 1000 mL via INTRAVENOUS

## 2021-07-19 MED ORDER — HYDROMORPHONE HCL 1 MG/ML IJ SOLN
1.0000 mg | INTRAMUSCULAR | Status: DC | PRN
  Administered 2021-07-19 – 2021-07-21 (×11): 1 mg via INTRAVENOUS
  Filled 2021-07-19 (×11): qty 1

## 2021-07-19 NOTE — ED Triage Notes (Signed)
C/o diffuse abd pain with n/v x 1 month

## 2021-07-19 NOTE — ED Notes (Signed)
Patient transported to CT 

## 2021-07-19 NOTE — ED Notes (Signed)
ED Provider at bedside. 

## 2021-07-19 NOTE — ED Provider Notes (Signed)
[facility] EMERGENCY DEPARTMENT Provider Note   CSN: [id number] Arrival date & time: 07/19/21  1926     History Chief Complaint  Patient presents with   Abdominal Pain    Joseph Haney is a 45 y.o. male hx of GERD, seizures, here presenting with abdominal pain.  Patient states that about a month ago, he has been having left-sided abdominal pain.  He states that it was intermittent and then became more constant over the last week or so.  He had several episodes of vomiting.  He states that he still passing gas.  Patient never had a colonoscopy before.  The history is provided by the patient.      Past Medical History:  Diagnosis Date   Alcohol abuse    GERD (gastroesophageal reflux disease)    Seizure [facility])     Patient Active Problem List   Diagnosis Date Noted   Enteritis 11/26/2015   Hypomagnesemia 11/26/2015   Rectal bleeding 11/26/2015   Urinary tract infectious disease    Hypokalemia 40/04/6760   Alcoholic ketoacidosis 95/05/3266   Hyponatremia 05/21/2015   Concussion 05/21/2015   C. 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  Authorizing Provider  carvedilol (COREG) 6.25 MG tablet Take 2 tablets (12.5 mg total) by mouth 2 (two) times daily with a meal. 10/12/15   Debbe Odea, MD  docusate sodium (COLACE) 100 MG capsule Take 100 mg by mouth daily.    [provider]  levETIRAcetam (KEPPRA) 500 MG tablet Take 1 tablet (500 mg total) by mouth 2 (two) times daily. 10/12/15   Debbe Odea, MD  loperamide (IMODIUM) 2 MG capsule Take 1 capsule (2 mg total) by mouth as needed for diarrhea or loose stools. 11/30/15   Charlynne Cousins, MD  methocarbamol (ROBAXIN) 500 MG tablet Take 1 tablet (500 mg total) by mouth 2 (two) times daily. Patient taking differently: Take 500 mg by mouth 2 (two) times daily as needed for muscle spasms.  10/14/15   Domenic Moras, [name]  omeprazole (PRILOSEC) 20 MG capsule Take 1 capsule (20 mg total) by mouth daily. 11/20/15   Varney Biles, MD    Allergies    Morphine and related and Other  Review of Systems   Review of Systems  Gastrointestinal:  Positive for abdominal pain.  All other systems reviewed and are negative.  Physical Exam Updated Vital Signs BP (!) 151/94   Pulse (!) 105   Temp 98.9 F (37.2 C) (Oral)   Resp 18   Ht 5\' 9"  (1.753 m)  Wt 77.1 kg   SpO2 100%   BMI 25.10 kg/m   Physical Exam Vitals and nursing note reviewed.  Constitutional:      Comments: Uncomfortable  HENT:     Head: Normocephalic.     Mouth/Throat:     Mouth: Mucous membranes are moist.  Eyes:     Extraocular Movements: Extraocular movements intact.     Pupils: Pupils are equal, round, and reactive to light.  Cardiovascular:     Rate and Rhythm: Normal rate and regular rhythm.  Abdominal:     General: Abdomen is flat.     Comments: Mild left upper quadrant tenderness  Genitourinary:    Comments: No obvious melena or blood in stool on rectal exam  Skin:    General: Skin is warm.     Capillary Refill: Capillary refill takes less than 2 seconds.  Neurological:     General: No  focal deficit present.     Mental Status: He is oriented to person, place, and time.  Psychiatric:        Mood and Affect: Mood normal.        Behavior: Behavior normal.    ED Results / Procedures / Treatments   Labs (all labs ordered are listed, but only abnormal results are displayed) Labs Reviewed  LIPASE, BLOOD - Abnormal; Notable for the following components:      Result Value   Lipase 55 (*)    All other components within normal limits  COMPREHENSIVE METABOLIC PANEL - Abnormal; Notable for the following components:   Glucose, Bld 105 (*)    Total Protein 9.1 (*)    AST 14 (*)    All other components within normal limits  CBC - Abnormal; Notable for the following components:   WBC 13.2 (*)    RBC 4.08 (*)    Hemoglobin 7.0 (*)    HCT 26.1 (*)    MCV 64.0 (*)    MCH 17.2 (*)    MCHC 26.8 (*)    RDW 18.2 (*)    Platelets 621 (*)    All other components within normal limits  RESP PANEL BY RT-PCR (FLU A&B, COVID) ARPGX2  URINALYSIS, ROUTINE W REFLEX MICROSCOPIC  OCCULT BLOOD X 1 CARD TO LAB, STOOL    EKG None  Radiology CT ABDOMEN PELVIS W CONTRAST  Result Date: 07/19/2021 CLINICAL DATA:  Abdominal distension. EXAM: CT ABDOMEN AND PELVIS WITH CONTRAST TECHNIQUE: Multidetector CT imaging of the abdomen and pelvis was performed using the standard protocol following bolus administration of intravenous contrast. CONTRAST:  162mL OMNIPAQUE IOHEXOL 300 MG/ML  SOLN COMPARISON:  CT of the abdomen pelvis dated 05/20/2013. FINDINGS: Lower chest: Linear atelectasis/scarring in the right middle lobe and lingula. The visualized lung bases are otherwise clear. No intra-abdominal free air or free fluid. Hepatobiliary: Subcentimeter hypodense lesion in the dome of the liver is not characterized. The liver is otherwise unremarkable. No intrahepatic biliary ductal dilatation. Probable small amount of sludge or small stones within gallbladder. No pericholecystic fluid or evidence of acute  cholecystitis by CT. Pancreas: Unremarkable. No pancreatic ductal dilatation or surrounding inflammatory changes. Spleen: Normal in size without focal abnormality. Adrenals/Urinary Tract: The adrenal glands, kidneys, visualized ureters, and urinary bladder appear unremarkable. Stomach/Bowel: There is focal area of high-grade narrowing of the proximal ascending colon which may be related to stricture secondary to chronic inflammation or a mass. There is dilatation of the cecum and distal and terminal ileum. The terminal ileum measures 3.6 cm in diameter.  There is thickened and dilated appearance of the appendix measuring 15 mm in thickness. This likely represents distension of the appendix secondary to obstruction of the proximal ascending colon. Acute appendicitis is less likely. Vascular/Lymphatic: The abdominal aorta and IVC are unremarkable. No portal venous gas. There is no adenopathy. Reproductive: The prostate and seminal vesicles are grossly unremarkable. No pelvic mass. Other: Small fat containing umbilical hernia. Musculoskeletal: No acute or significant osseous findings. IMPRESSION: Focal area of high-grade narrowing of the proximal ascending colon may be sequela of chronic inflammation and stricture versus a mass. There is mild distension of the cecum and terminal ileum. Clinical correlation and further evaluation with lower GI study or colonoscopy recommended. Electronically Signed   By: Anner Crete M.D.   On: 07/19/2021 21:30    Procedures Procedures   Medications Ordered in ED Medications  HYDROmorphone (DILAUDID) injection 1 mg (1 mg Intravenous Given 07/19/21 2256)  sodium chloride 0.9 % bolus 1,000 mL ( Intravenous Stopped 07/19/21 2226)  ondansetron (ZOFRAN) injection 4 mg (4 mg Intravenous Given 07/19/21 2101)  HYDROmorphone (DILAUDID) injection 1 mg (1 mg Intravenous Given 07/19/21 2123)  iohexol (OMNIPAQUE) 300 MG/ML solution 100 mL (100 mLs Intravenous Contrast Given 07/19/21  2122)  0.9 %  sodium chloride infusion ( Intravenous New Bag/Given 07/19/21 2256)    ED Course  I have reviewed the triage vital signs and the nursing notes.  Pertinent labs & imaging results that were available during my care of the patient were reviewed by me and considered in my medical decision making (see chart for details).    MDM Rules/Calculators/A&P                           Joseph Haney is a 45 y.o. male presenting with left-sided abdominal pain.  Patient has been having left-sided abdominal pain for the last month or so.  Plan to get CBC and CMP and CT abdomen pelvis  11:11 PM CT showed high-grade narrowing of the proximal ascending colon which could be from a stricture versus a mass.  Patient has no history of IBD.  He has never seen a GI doctor before.  Discussed case with Dr. Therisa Doyne from GI regarding getting colonoscopy.  She states that he will take weeks to get a colonoscopy outpatient.  Since patient is already anemic with a hemoglobin of 7, she recommend that patient be brought into the hospital to get colonoscopy inpatient.  Patient is guaiac negative and has brown stool  Final Clinical Impression(s) / ED Diagnoses Final diagnoses:  None    Rx / DC Orders ED Discharge Orders     None        Drenda Freeze, MD 07/19/21 2313

## 2021-07-20 DIAGNOSIS — E43 Unspecified severe protein-calorie malnutrition: Secondary | ICD-10-CM | POA: Diagnosis not present

## 2021-07-20 DIAGNOSIS — F1010 Alcohol abuse, uncomplicated: Secondary | ICD-10-CM | POA: Diagnosis not present

## 2021-07-20 DIAGNOSIS — D72829 Elevated white blood cell count, unspecified: Secondary | ICD-10-CM | POA: Diagnosis present

## 2021-07-20 DIAGNOSIS — D500 Iron deficiency anemia secondary to blood loss (chronic): Secondary | ICD-10-CM | POA: Diagnosis present

## 2021-07-20 DIAGNOSIS — R531 Weakness: Secondary | ICD-10-CM | POA: Diagnosis not present

## 2021-07-20 DIAGNOSIS — C772 Secondary and unspecified malignant neoplasm of intra-abdominal lymph nodes: Secondary | ICD-10-CM | POA: Diagnosis present

## 2021-07-20 DIAGNOSIS — K219 Gastro-esophageal reflux disease without esophagitis: Secondary | ICD-10-CM | POA: Diagnosis present

## 2021-07-20 DIAGNOSIS — Z885 Allergy status to narcotic agent status: Secondary | ICD-10-CM | POA: Diagnosis not present

## 2021-07-20 DIAGNOSIS — K6389 Other specified diseases of intestine: Secondary | ICD-10-CM | POA: Diagnosis not present

## 2021-07-20 DIAGNOSIS — C19 Malignant neoplasm of rectosigmoid junction: Secondary | ICD-10-CM | POA: Diagnosis present

## 2021-07-20 DIAGNOSIS — C189 Malignant neoplasm of colon, unspecified: Secondary | ICD-10-CM | POA: Diagnosis not present

## 2021-07-20 DIAGNOSIS — G40909 Epilepsy, unspecified, not intractable, without status epilepticus: Secondary | ICD-10-CM | POA: Diagnosis present

## 2021-07-20 DIAGNOSIS — Z8249 Family history of ischemic heart disease and other diseases of the circulatory system: Secondary | ICD-10-CM | POA: Diagnosis not present

## 2021-07-20 DIAGNOSIS — K567 Ileus, unspecified: Secondary | ICD-10-CM | POA: Diagnosis not present

## 2021-07-20 DIAGNOSIS — C7989 Secondary malignant neoplasm of other specified sites: Secondary | ICD-10-CM | POA: Diagnosis present

## 2021-07-20 DIAGNOSIS — D630 Anemia in neoplastic disease: Secondary | ICD-10-CM | POA: Diagnosis present

## 2021-07-20 DIAGNOSIS — K56699 Other intestinal obstruction unspecified as to partial versus complete obstruction: Secondary | ICD-10-CM | POA: Diagnosis present

## 2021-07-20 DIAGNOSIS — K632 Fistula of intestine: Secondary | ICD-10-CM | POA: Diagnosis not present

## 2021-07-20 DIAGNOSIS — K56609 Unspecified intestinal obstruction, unspecified as to partial versus complete obstruction: Secondary | ICD-10-CM | POA: Diagnosis not present

## 2021-07-20 DIAGNOSIS — Z79899 Other long term (current) drug therapy: Secondary | ICD-10-CM | POA: Diagnosis not present

## 2021-07-20 DIAGNOSIS — K9189 Other postprocedural complications and disorders of digestive system: Secondary | ICD-10-CM | POA: Diagnosis not present

## 2021-07-20 DIAGNOSIS — I10 Essential (primary) hypertension: Secondary | ICD-10-CM | POA: Diagnosis present

## 2021-07-20 DIAGNOSIS — Z20822 Contact with and (suspected) exposure to covid-19: Secondary | ICD-10-CM | POA: Diagnosis present

## 2021-07-20 DIAGNOSIS — K648 Other hemorrhoids: Secondary | ICD-10-CM | POA: Diagnosis not present

## 2021-07-20 LAB — CBC WITH DIFFERENTIAL/PLATELET
Abs Immature Granulocytes: 0.04 10*3/uL (ref 0.00–0.07)
Basophils Absolute: 0.1 10*3/uL (ref 0.0–0.1)
Basophils Relative: 1 %
Eosinophils Absolute: 0.1 10*3/uL (ref 0.0–0.5)
Eosinophils Relative: 2 %
HCT: 22.2 % — ABNORMAL LOW (ref 39.0–52.0)
Hemoglobin: 6 g/dL — CL (ref 13.0–17.0)
Immature Granulocytes: 1 %
Lymphocytes Relative: 25 %
Lymphs Abs: 2 10*3/uL (ref 0.7–4.0)
MCH: 17.1 pg — ABNORMAL LOW (ref 26.0–34.0)
MCHC: 27 g/dL — ABNORMAL LOW (ref 30.0–36.0)
MCV: 63.4 fL — ABNORMAL LOW (ref 80.0–100.0)
Monocytes Absolute: 0.4 10*3/uL (ref 0.1–1.0)
Monocytes Relative: 5 %
Neutro Abs: 5.5 10*3/uL (ref 1.7–7.7)
Neutrophils Relative %: 66 %
Platelets: 504 10*3/uL — ABNORMAL HIGH (ref 150–400)
RBC: 3.5 MIL/uL — ABNORMAL LOW (ref 4.22–5.81)
RDW: 18.1 % — ABNORMAL HIGH (ref 11.5–15.5)
WBC: 8.1 10*3/uL (ref 4.0–10.5)
nRBC: 0 % (ref 0.0–0.2)

## 2021-07-20 LAB — COMPREHENSIVE METABOLIC PANEL
ALT: 8 U/L (ref 0–44)
AST: 18 U/L (ref 15–41)
Albumin: 3.5 g/dL (ref 3.5–5.0)
Alkaline Phosphatase: 63 U/L (ref 38–126)
Anion gap: 7 (ref 5–15)
BUN: 9 mg/dL (ref 6–20)
CO2: 25 mmol/L (ref 22–32)
Calcium: 8.6 mg/dL — ABNORMAL LOW (ref 8.9–10.3)
Chloride: 105 mmol/L (ref 98–111)
Creatinine, Ser: 0.82 mg/dL (ref 0.61–1.24)
GFR, Estimated: >60 mL/min (ref >60)
Glucose, Bld: 110 mg/dL — ABNORMAL HIGH (ref 70–99)
Potassium: 4.2 mmol/L (ref 3.5–5.1)
Sodium: 137 mmol/L (ref 135–145)
Total Bilirubin: 0.3 mg/dL (ref 0.3–1.2)
Total Protein: 7.8 g/dL (ref 6.5–8.1)

## 2021-07-20 LAB — IRON AND TIBC
Iron: 11 ug/dL — ABNORMAL LOW (ref 45–182)
Saturation Ratios: 2 % — ABNORMAL LOW (ref 17.9–39.5)
TIBC: 470 ug/dL — ABNORMAL HIGH (ref 250–450)
UIBC: 459 ug/dL

## 2021-07-20 LAB — FERRITIN: Ferritin: 2 ng/mL — ABNORMAL LOW (ref 24–336)

## 2021-07-20 MED ORDER — LEVETIRACETAM 500 MG PO TABS: 500.0000 mg | ORAL_TABLET | Freq: Two times a day (BID) | ORAL | Status: DC

## 2021-07-20 MED ORDER — PEG 3350-KCL-NA BICARB-NACL 420 G PO SOLR
4000.0000 mL | Freq: Once | ORAL | Status: AC
  Administered 2021-07-20: 4000 mL via ORAL

## 2021-07-20 MED ORDER — CARVEDILOL 12.5 MG PO TABS: 12.5000 mg | ORAL_TABLET | Freq: Two times a day (BID) | ORAL | Status: DC

## 2021-07-20 MED ORDER — ACETAMINOPHEN 325 MG PO TABS
650.0000 mg | ORAL_TABLET | Freq: Once | ORAL | Status: AC
  Administered 2021-07-20: 650 mg via ORAL
  Filled 2021-07-20: qty 2

## 2021-07-20 NOTE — H&P (Addendum)
History and Physical    Joseph Haney OXB:[id number] DOB: 1976/01/16 DOA: 07/19/2021  PCP: Pcp, No   Chief Complaint: Intractable abdominal pain, nausea, and vomiting.  HPI: Joseph Haney is a 45 y.o. male with medical history significant of hypertension, gastroesophageal reflux disease and remote history of alcohol abuse, and unspecified seizure disorder who presents with a 1 month history of abdominal pain, nausea and vomiting that have greatly accelerated over the past week. CT abd/pelvis show focal area of high grade narrowing at proximal ascending colon. GI consulted and hospitalist called for admission.Of note patient has not had follow up in > 2 years since PCP office closed and has had no follow up or medication refills in that time frame. Of note patient has been attempting to 'diet' which consisted of eating only mandarins for the past 2 months.  ED Course: Labs concerning for profound anemia without clear source, no active signs of bleeding per the patient.  Review of Systems: As per HPI, intractable nausea, vomiting and abdominal pain; denies chest pain, shortness of breath, headache, fevers, chills.   Assessment/Plan   Intractable nausea, vomiting, and abdominal pain Unspecified ascending proximal colon mass -GI following - appreciate insight/recs - possible colonoscopy to evaluate in the next 24-48h -NPO except for meds and sips -Supportive care with pain/nausea/vomiting medication as necessary  Acute symptomatic anemia, POA Rule out acute bleed vs iron deficiency -Fe 11, TIB 470, ferritin 2 - likely complicated by his poor diet over the past 2 months -FOBT pending -Mother is a Sales promotion account executive Witness, patient not currently willing to consent to blood transfusion but is agreeable for emergent blood if he becomes unstable. Willing to discuss with his family transfusion options once they arrive tonight. -hold further lab draws unless absolutely necessary  Hypertension,  essential Home med list currently contains carvedilol, needs to be verified  Alcohol use/abuse No recent alcohol use (months) no indication for CIWA  GERD PPI per GI as indicated  Seizure disorder Continue Keppra 500 BID (can be IV if necessary around endoscopy) **Patient indicates he is no longer taking keppra - this appears to be due to no follow up and no refills; discussed restarting this medication but he currently refused.  DVT prophylaxis: SCDs only given ? Active bleed Code Status: Full  Family Communication: Mother at bedside   Status is: Inpt  Dispo: The patient is from: Home              Anticipated d/c is to: Home              Anticipated d/c date is: 24-48h pending above              Patient currently NOT medically stable for discharge given need for close monitoring as above  Consultants:  GI - Dr Therisa Doyne  Procedures:  Colonoscopy pending GI schedule   Past Medical History:  Diagnosis Date   Alcohol abuse    GERD (gastroesophageal reflux disease)    Seizure ([place])     Past Surgical History:  Procedure Laterality Date   NO PAST SURGERIES       reports that he has never smoked. He has never used smokeless tobacco. He reports current alcohol use. He reports that he does not use drugs.  Allergies  Allergen Reactions   Morphine And Related Itching   Other     Pt refuses blood products. He is a Air cabin crew witness    Family History  Problem Relation Age of Onset   Coronary artery  disease Mother    Coronary artery disease Father    Heart attack Father 57    Prior to Admission medications   Medication Sig Start Date End Date Taking? Authorizing Provider  carvedilol (COREG) 6.25 MG tablet Take 2 tablets (12.5 mg total) by mouth 2 (two) times daily with a meal. 10/12/15   Debbe Odea, MD  docusate sodium (COLACE) 100 MG capsule Take 100 mg by mouth daily.    [provider]  levETIRAcetam (KEPPRA) 500 MG tablet Take 1 tablet (500 mg total) by  mouth 2 (two) times daily. 10/12/15   Debbe Odea, MD  loperamide (IMODIUM) 2 MG capsule Take 1 capsule (2 mg total) by mouth as needed for diarrhea or loose stools. 11/30/15   Charlynne Cousins, MD  methocarbamol (ROBAXIN) 500 MG tablet Take 1 tablet (500 mg total) by mouth 2 (two) times daily. Patient taking differently: Take 500 mg by mouth 2 (two) times daily as needed for muscle spasms.  10/14/15   Domenic Moras, [name]  omeprazole (PRILOSEC) 20 MG capsule Take 1 capsule (20 mg total) by mouth daily. 11/20/15   Varney Biles, MD    Physical Exam: Vitals:   07/20/21 0800 07/20/21 1000 07/20/21 1247 07/20/21 1439  BP: (!) 149/96 137/89 (!) 145/96 131/81  Pulse: (!) 102 (!) 103 95 99  Resp: 15 16 16 18   Temp:   99.3 F (37.4 C) 98.8 F (37.1 C)  TempSrc:   Oral Oral  SpO2: 99% 98% 97% 100%  Weight:      Height:        Constitutional: NAD, calm, comfortable Vitals:   07/20/21 0800 07/20/21 1000 07/20/21 1247 07/20/21 1439  BP: (!) 149/96 137/89 (!) 145/96 131/81  Pulse: (!) 102 (!) 103 95 99  Resp: 15 16 16 18   Temp:   99.3 F (37.4 C) 98.8 F (37.1 C)  TempSrc:   Oral Oral  SpO2: 99% 98% 97% 100%  Weight:      Height:       General:  Pleasantly resting in bed, No acute distress. HEENT:  Normocephalic atraumatic.  Sclerae nonicteric, noninjected.  Extraocular movements intact bilaterally. Neck:  Without mass or deformity.  Trachea is midline. Lungs:  Clear to auscultate bilaterally without rhonchi, wheeze, or rales. Heart:  Regular rate and rhythm.  Without murmurs, rubs, or gallops. Abdomen:  Soft, nontender, nondistended.  Without guarding or rebound. Extremities: Without cyanosis, clubbing, edema, or obvious deformity. Vascular:  Dorsalis pedis and posterior tibial pulses palpable bilaterally. Skin:  Warm and dry, no erythema, no ulcerations.  Labs on Admission: I have personally reviewed following labs and imaging studies  CBC: Recent Labs  Lab 07/19/21 1952  07/20/21 0740  WBC 13.2* 8.1  NEUTROABS  --  5.5  HGB 7.0* 6.0*  HCT 26.1* 22.2*  MCV 64.0* 63.4*  PLT 621* 003*   Basic Metabolic Panel: Recent Labs  Lab 07/19/21 1952 07/20/21 0740  NA 136 137  K 3.9 4.2  CL 101 105  CO2 25 25  GLUCOSE 105* 110*  BUN 15 9  CREATININE 0.99 0.82  CALCIUM 9.2 8.6*   GFR: Estimated Creatinine Clearance: 111.4 mL/min (by C-G formula based on SCr of 0.82 mg/dL). Liver Function Tests: Recent Labs  Lab 07/19/21 1952 07/20/21 0740  AST 14* 18  ALT 9 8  ALKPHOS 69 63  BILITOT 0.4 0.3  PROT 9.1* 7.8  ALBUMIN 4.3 3.5   Recent Labs  Lab 07/19/21 1952  LIPASE 55*  No results for input(s): AMMONIA in the last 168 hours. Coagulation Profile: No results for input(s): INR, PROTIME in the last 168 hours. Cardiac Enzymes: No results for input(s): CKTOTAL, CKMB, CKMBINDEX, TROPONINI in the last 168 hours. BNP (last 3 results) No results for input(s): PROBNP in the last 8760 hours. HbA1C: No results for input(s): HGBA1C in the last 72 hours. CBG: No results for input(s): GLUCAP in the last 168 hours. Lipid Profile: No results for input(s): CHOL, HDL, LDLCALC, TRIG, CHOLHDL, LDLDIRECT in the last 72 hours. Thyroid Function Tests: No results for input(s): TSH, T4TOTAL, FREET4, T3FREE, THYROIDAB in the last 72 hours. Anemia Panel: No results for input(s): VITAMINB12, FOLATE, FERRITIN, TIBC, IRON, RETICCTPCT in the last 72 hours. Urine analysis:    Component Value Date/Time   COLORURINE YELLOW 07/19/2021 2154   APPEARANCEUR CLEAR 07/19/2021 2154   LABSPEC 1.005 07/19/2021 2154   PHURINE 6.5 07/19/2021 2154   GLUCOSEU NEGATIVE 07/19/2021 2154   HGBUR NEGATIVE 07/19/2021 2154   [place] NEGATIVE 07/19/2021 2154   [place] NEGATIVE 07/19/2021 2154   PROTEINUR NEGATIVE 07/19/2021 2154   UROBILINOGEN 1.0 05/21/2015 0606   NITRITE NEGATIVE 07/19/2021 2154   LEUKOCYTESUR NEGATIVE 07/19/2021 2154    Radiological Exams on  Admission: CT ABDOMEN PELVIS W CONTRAST  Result Date: 07/19/2021 CLINICAL DATA:  Abdominal distension. EXAM: CT ABDOMEN AND PELVIS WITH CONTRAST TECHNIQUE: Multidetector CT imaging of the abdomen and pelvis was performed using the standard protocol following bolus administration of intravenous contrast. CONTRAST:  136mL OMNIPAQUE IOHEXOL 300 MG/ML  SOLN COMPARISON:  CT of the abdomen pelvis dated 05/20/2013. FINDINGS: Lower chest: Linear atelectasis/scarring in the right middle lobe and lingula. The visualized lung bases are otherwise clear. No intra-abdominal free air or free fluid. Hepatobiliary: Subcentimeter hypodense lesion in the dome of the liver is not characterized. The liver is otherwise unremarkable. No intrahepatic biliary ductal dilatation. Probable small amount of sludge or small stones within gallbladder. No pericholecystic fluid or evidence of acute cholecystitis by CT. Pancreas: Unremarkable. No pancreatic ductal dilatation or surrounding inflammatory changes. Spleen: Normal in size without focal abnormality. Adrenals/Urinary Tract: The adrenal glands, kidneys, visualized ureters, and urinary bladder appear unremarkable. Stomach/Bowel: There is focal area of high-grade narrowing of the proximal ascending colon which may be related to stricture secondary to chronic inflammation or a mass. There is dilatation of the cecum and distal and terminal ileum. The terminal ileum measures 3.6 cm in diameter. There is thickened and dilated appearance of the appendix measuring 15 mm in thickness. This likely represents distension of the appendix secondary to obstruction of the proximal ascending colon. Acute appendicitis is less likely. Vascular/Lymphatic: The abdominal aorta and IVC are unremarkable. No portal venous gas. There is no adenopathy. Reproductive: The prostate and seminal vesicles are grossly unremarkable. No pelvic mass. Other: Small fat containing umbilical hernia. Musculoskeletal: No acute or  significant osseous findings. IMPRESSION: Focal area of high-grade narrowing of the proximal ascending colon may be sequela of chronic inflammation and stricture versus a mass. There is mild distension of the cecum and terminal ileum. Clinical correlation and further evaluation with lower GI study or colonoscopy recommended. Electronically Signed   By: Anner Crete M.D.   On: 07/19/2021 21:30     [facility]  For contact please use secure messenger on Epic  If 7PM-7AM, please contact night-coverage located on [email or web address]   07/20/2021, 5:14 PM

## 2021-07-20 NOTE — ED Notes (Addendum)
Report given to [facility] by phone ETA 15-20 minutes

## 2021-07-20 NOTE — ED Notes (Signed)
Oxygen applied for patient comfort, states he feels mildly short of breath at rest in bed.

## 2021-07-20 NOTE — Consult Note (Addendum)
[facility] Gastroenterology Consult  Referring Provider: ER Primary Care Physician:  Pcp, No Primary Gastroenterologist: Althia Forts  Reason for Consultation: Abnormal CAT scan  HPI: Zakhi Dupre is a 45 y.o. male went to [place] with complains of abdominal pain. Patient states that for the last 3 weeks he has had abdominal pain in different areas of the abdomen, sometimes generalized. 2 to 3 weeks ago he had about 10 tangerines and lots of plums and believes his abdominal pain started since. Normally has 1 bowel movement a day and denies noticing blood in stool or black stools. However for the last 1 week he reports constipation and difficulty with a bowel movement. His last bowel movement was yesterday.  Patient denies nausea, vomiting. He has been experiencing acid reflux and heartburn, and has been taking his fiance's Protonix with relief of majority of the symptoms. He denies difficulty swallowing or pain on swallowing. He denies unintentional weight loss or loss of appetite. No prior colonoscopy. He is 1 of 9 siblings. His fiance and mother were present in the room to provide additional history. One of his brothers has ulcerative colitis and is on medication.  Patient states he used to drink heavily up until 6 to 7 years ago and now does not drink alcohol. He is on narcotics from a pain management center in [place].  Patient reports being on oxycodone for back pain and states that he does not work and is on disability. He has history of hypertension but is not on any medications and states that he has an upcoming appointment with his primary care physician scheduled for December.   Past Medical History:  Diagnosis Date   Alcohol abuse    GERD (gastroesophageal reflux disease)    Seizure ([place])     Past Surgical History:  Procedure Laterality Date   NO PAST SURGERIES      Prior to Admission medications   Medication Sig Start Date End Date Taking?  Authorizing Provider  carvedilol (COREG) 6.25 MG tablet Take 2 tablets (12.5 mg total) by mouth 2 (two) times daily with a meal. 10/12/15   Debbe Odea, MD  docusate sodium (COLACE) 100 MG capsule Take 100 mg by mouth daily.    [provider]  levETIRAcetam (KEPPRA) 500 MG tablet Take 1 tablet (500 mg total) by mouth 2 (two) times daily. 10/12/15   Debbe Odea, MD  loperamide (IMODIUM) 2 MG capsule Take 1 capsule (2 mg total) by mouth as needed for diarrhea or loose stools. 11/30/15   Charlynne Cousins, MD  methocarbamol (ROBAXIN) 500 MG tablet Take 1 tablet (500 mg total) by mouth 2 (two) times daily. Patient taking differently: Take 500 mg by mouth 2 (two) times daily as needed for muscle spasms.  10/14/15   Domenic Moras, [name]  omeprazole (PRILOSEC) 20 MG capsule Take 1 capsule (20 mg total) by mouth daily. 11/20/15   Varney Biles, MD    Current Facility-Administered Medications  Medication Dose Route Frequency Provider Last Rate Last Admin   HYDROmorphone (DILAUDID) injection 1 mg  1 mg Intravenous Q3H PRN Drenda Freeze, MD   1 mg at 07/20/21 1141    Allergies as of 07/19/2021 - Review Complete 07/19/2021  Allergen Reaction Noted   Morphine and related Itching 10/15/2012   Other  10/10/2015    Family History  Problem Relation Age of Onset   Coronary artery disease Mother    Coronary artery disease Father    Heart attack Father 22  Social History   Socioeconomic History   Marital status: Single    Spouse name: Not on file   Number of children: Not on file   Years of education: Not on file   Highest education level: Not on file  Occupational History   Not on file  Tobacco Use   Smoking status: Never   Smokeless tobacco: Never  Substance and Sexual Activity   Alcohol use: Yes    Comment: one 40 ounce a week   Drug use: No    Frequency: 1.0 times per week   Sexual activity: Not on file  Other Topics Concern   Not on file  Social History Narrative    Not on file   Social Determinants of Health   Financial Resource Strain: Not on file  Food Insecurity: Not on file  Transportation Needs: Not on file  Physical Activity: Not on file  Stress: Not on file  Social Connections: Not on file  Intimate Partner Violence: Not on file    Review of Systems: Positive for: GI: Described in detail in HPI.    Gen: fatigue, weakness,  denies any fever, chills, rigors, night sweats, anorexia, malaise, involuntary weight loss, and sleep disorder CV: Denies chest pain, angina, palpitations, syncope, orthopnea, PND, peripheral edema, and claudication. Resp: dyspnea, denies  cough, sputum, wheezing, coughing up blood. GU : Denies urinary burning, blood in urine, urinary frequency, urinary hesitancy, nocturnal urination, and urinary incontinence. MS: Back pain  Derm: Denies rash, itching, oral ulcerations, hives, unhealing ulcers.  Psych: Denies depression, anxiety, memory loss, suicidal ideation, hallucinations,  and confusion. Heme: Denies bruising, bleeding, and enlarged lymph nodes. Neuro:  headaches, dizziness,  denies any paresthesias. Endo:  Denies any problems with DM, thyroid, adrenal function.  Physical Exam: Vital signs in last 24 hours: Temp:  [98.5 F (36.9 C)-99.3 F (37.4 C)] 99.3 F (37.4 C) (11/19 1247) Pulse Rate:  [95-115] 95 (11/19 1247) Resp:  [10-18] 16 (11/19 1247) BP: (137-161)/(89-101) 145/96 (11/19 1247) SpO2:  [97 %-100 %] 97 % (11/19 1247) Weight:  [77.1 kg] 77.1 kg (11/18 1942)    General:   Alert,  Well-developed, well-nourished, pleasant and cooperative in NAD Head:  Normocephalic and atraumatic. Eyes:  Sclera clear, no icterus.   Conjunctiva pink. Ears:  Normal auditory acuity. Nose:  No deformity, discharge,  or lesions. Mouth:  No deformity or lesions.  Oropharynx pink & moist. Neck:  Supple; no masses or thyromegaly. Lungs:  Clear throughout to auscultation.   No wheezes, crackles, or rhonchi. No acute  distress. Heart:  Regular rate and rhythm; no murmurs, clicks, rubs,  or gallops. Extremities:  Without clubbing or edema. Neurologic:  Alert and  oriented x4;  grossly normal neurologically. Skin:  Intact without significant lesions or rashes. Psych:  Alert and cooperative. Normal mood and affect. Abdomen:  Soft, nontender and slightly distended. No masses, hepatosplenomegaly or hernias noted. Normal bowel sounds, without guarding, and without rebound.         Lab Results: Recent Labs    07/19/21 1952 07/20/21 0740  WBC 13.2* 8.1  HGB 7.0* 6.0*  HCT 26.1* 22.2*  PLT 621* 504*   BMET Recent Labs    07/19/21 1952 07/20/21 0740  NA 136 137  K 3.9 4.2  CL 101 105  CO2 25 25  GLUCOSE 105* 110*  BUN 15 9  CREATININE 0.99 0.82  CALCIUM 9.2 8.6*   LFT Recent Labs    07/20/21 0740  PROT 7.8  ALBUMIN 3.5  AST 18  ALT 8  ALKPHOS 63  BILITOT 0.3   PT/INR No results for input(s): LABPROT, INR in the last 72 hours.  Studies/Results: CT ABDOMEN PELVIS W CONTRAST  Result Date: 07/19/2021 CLINICAL DATA:  Abdominal distension. EXAM: CT ABDOMEN AND PELVIS WITH CONTRAST TECHNIQUE: Multidetector CT imaging of the abdomen and pelvis was performed using the standard protocol following bolus administration of intravenous contrast. CONTRAST:  138mL OMNIPAQUE IOHEXOL 300 MG/ML  SOLN COMPARISON:  CT of the abdomen pelvis dated 05/20/2013. FINDINGS: Lower chest: Linear atelectasis/scarring in the right middle lobe and lingula. The visualized lung bases are otherwise clear. No intra-abdominal free air or free fluid. Hepatobiliary: Subcentimeter hypodense lesion in the dome of the liver is not characterized. The liver is otherwise unremarkable. No intrahepatic biliary ductal dilatation. Probable small amount of sludge or small stones within gallbladder. No pericholecystic fluid or evidence of acute cholecystitis by CT. Pancreas: Unremarkable. No pancreatic ductal dilatation or surrounding  inflammatory changes. Spleen: Normal in size without focal abnormality. Adrenals/Urinary Tract: The adrenal glands, kidneys, visualized ureters, and urinary bladder appear unremarkable. Stomach/Bowel: There is focal area of high-grade narrowing of the proximal ascending colon which may be related to stricture secondary to chronic inflammation or a mass. There is dilatation of the cecum and distal and terminal ileum. The terminal ileum measures 3.6 cm in diameter. There is thickened and dilated appearance of the appendix measuring 15 mm in thickness. This likely represents distension of the appendix secondary to obstruction of the proximal ascending colon. Acute appendicitis is less likely. Vascular/Lymphatic: The abdominal aorta and IVC are unremarkable. No portal venous gas. There is no adenopathy. Reproductive: The prostate and seminal vesicles are grossly unremarkable. No pelvic mass. Other: Small fat containing umbilical hernia. Musculoskeletal: No acute or significant osseous findings. IMPRESSION: Focal area of high-grade narrowing of the proximal ascending colon may be sequela of chronic inflammation and stricture versus a mass. There is mild distension of the cecum and terminal ileum. Clinical correlation and further evaluation with lower GI study or colonoscopy recommended. Electronically Signed   By: Anner Crete M.D.   On: 07/19/2021 21:30    Impression: Abdominal pain with abnormal CAT scan-focal area of high-grade narrowing of proximal ascending colon-chronic inflammation/stricture versus mass Mild distention of cecum and terminal ileum  Severe microcytic anemia, hemoglobin 6, MCV 63.4, elevated platelet 504 likely reactive thrombocytosis, surprisingly FOBT negative  On opioids from pain management center due to back pain  Plan: Will need diagnostic colonoscopy, likely 2-day prep as patient has high-grade narrowing of proximal ascending. Will start clear liquid diet and colonic prep  today, with the intention to perform diagnostic colonoscopy on Monday Meanwhile recommend PRBC transfusion to keep hemoglobin above 7 Patient's mom is a Jehovah's Witness, patient reports not being able to decide whether or not he is agreeable to blood transfusion. I discussed about the risks of worsening dyspnea, possible hemodynamic instability, without blood transfusion. Patient wants to discuss this further with his fiance and mother before deciding whether he will accept blood transfusion. The risks and the benefits of the procedure were discussed with the patient and his family members at bedside in details. The understanding  and verbalize consent.   LOS: 0 days   Ronnette Juniper, MD  07/20/2021, 1:25 PM

## 2021-07-21 DIAGNOSIS — K6389 Other specified diseases of intestine: Secondary | ICD-10-CM | POA: Diagnosis not present

## 2021-07-21 LAB — PREPARE RBC (CROSSMATCH)

## 2021-07-21 MED ORDER — SODIUM CHLORIDE 0.9% IV SOLUTION: Freq: Once | INTRAVENOUS | Status: AC

## 2021-07-21 MED ORDER — PEG 3350-KCL-NA BICARB-NACL 420 G PO SOLR
4000.0000 mL | Freq: Once | ORAL | Status: AC
  Administered 2021-07-21: 4000 mL via ORAL

## 2021-07-21 MED ORDER — HYDROCODONE-ACETAMINOPHEN 5-325 MG PO TABS
1.0000 | ORAL_TABLET | ORAL | Status: DC | PRN
  Administered 2021-07-21 – 2021-07-23 (×6): 1 via ORAL
  Filled 2021-07-21 (×6): qty 1

## 2021-07-21 NOTE — Progress Notes (Signed)
PROGRESS NOTE    Joseph Haney  KKX:[id number] DOB: 23-Nov-1975 DOA: 07/19/2021 PCP: Joseph Haney, No   Brief Narrative:  Joseph Haney is a 45 y.o. male with medical history significant of hypertension, gastroesophageal reflux disease and remote history of alcohol abuse, and unspecified seizure disorder who presents with a 1 month history of abdominal pain, nausea and vomiting that have greatly accelerated over the past week. CT abd/pelvis show focal area of high grade narrowing at proximal ascending colon. GI consulted and hospitalist called for admission.  Of note patient has not had follow up in > 2 years since PCP office closed and has had no follow up or medication refills in that time frame. Of note patient has been attempting to 'diet' which consisted of eating only mandarins for the past 2 months. Labs concerning for profound anemia without clear source, no active signs of bleeding per the patient.    Assessment/Plan   Intractable nausea, vomiting, and abdominal pain Unspecified ascending proximal colon mass -GI following - appreciate insight/recs  -Unfortunately unable to perform endoscopy with hemoglobin at 6, lengthy discussion today about need for transfusion as below -Possible colonoscopy to evaluate in the next 24-48h -NPO except for meds and sips -Supportive care with pain/nausea/vomiting medication as necessary   Acute symptomatic anemia, POA Rule out acute bleed vs iron deficiency -Fe 11, TIB 470, ferritin 2 - likely complicated by his poor diet over the past 2 months -FOBT pending -Patient agreeable for transfusion today, 2 unit PRBC ordered, hopefully will correct his anemia and symptoms and he will be stable for endoscopy as above. -hold further lab draws unless absolutely necessary   Hypertension, essential Home med list currently contains carvedilol, needs to be verified   Alcohol use/abuse No recent alcohol use (months) no indication for CIWA   GERD PPI per GI  as indicated   Seizure disorder Continue Keppra 500 BID (can be IV if necessary around endoscopy) **Patient indicates he is no longer taking keppra - this appears to be due to no follow up and no refills; discussed restarting this medication but he currently refused.  DVT prophylaxis: None given questionable bleeding as above Code Status: Full Family Communication: Mother at bedside  Status is: Inpatient  Dispo: The patient is from: Home              Anticipated d/c is to: Home              Anticipated d/c date is: 24 to 48 hours              Patient currently not medically stable for discharge  Consultants:  GI  Procedures:  Endoscopy as above  Antimicrobials:  None indicated  Subjective: No acute issues or events overnight denies nausea vomiting diarrhea constipation headache fevers chills or chest pain, fatigue and weakness ongoing  Objective: Vitals:   07/20/21 1247 07/20/21 1439 07/20/21 2151 07/21/21 0605  BP: (!) 145/96 131/81 139/82 136/87  Pulse: 95 99 (!) 107 (!) 104  Resp: 16 18 18 16   Temp: 99.3 F (37.4 C) 98.8 F (37.1 C) 99.1 F (37.3 C) 98.3 F (36.8 C)  TempSrc: Oral Oral Oral   SpO2: 97% 100% 100% 100%  Weight:      Height:        Intake/Output Summary (Last 24 hours) at 07/21/2021 0737 Last data filed at 07/21/2021 0600 Gross per 24 hour  Intake 390 ml  Output --  Net 390 ml   Filed Weights   07/19/21  1942  Weight: 77.1 kg    Examination:  General exam: Appears calm and comfortable  Respiratory system: Clear to auscultation. Respiratory effort normal. Cardiovascular system: S1 & S2 heard, RRR. No JVD, murmurs, rubs, gallops or clicks. No pedal edema. Gastrointestinal system: Abdomen is nondistended, soft and nontender. No organomegaly or masses felt. Normal bowel sounds heard. Central nervous system: Alert and oriented. No focal neurological deficits. Extremities: Symmetric 5 x 5 power. Skin: No rashes, lesions or  ulcers Psychiatry: Judgement and insight appear normal. Mood & affect appropriate.     Data Reviewed: I have personally reviewed following labs and imaging studies  CBC: Recent Labs  Lab 07/19/21 1952 07/20/21 0740  WBC 13.2* 8.1  NEUTROABS  --  5.5  HGB 7.0* 6.0*  HCT 26.1* 22.2*  MCV 64.0* 63.4*  PLT 621* 063*   Basic Metabolic Panel: Recent Labs  Lab 07/19/21 1952 07/20/21 0740  NA 136 137  K 3.9 4.2  CL 101 105  CO2 25 25  GLUCOSE 105* 110*  BUN 15 9  CREATININE 0.99 0.82  CALCIUM 9.2 8.6*   GFR: Estimated Creatinine Clearance: 111.4 mL/min (by C-G formula based on SCr of 0.82 mg/dL). Liver Function Tests: Recent Labs  Lab 07/19/21 1952 07/20/21 0740  AST 14* 18  ALT 9 8  ALKPHOS 69 63  BILITOT 0.4 0.3  PROT 9.1* 7.8  ALBUMIN 4.3 3.5   Recent Labs  Lab 07/19/21 1952  LIPASE 55*   No results for input(s): AMMONIA in the last 168 hours. Coagulation Profile: No results for input(s): INR, PROTIME in the last 168 hours. Cardiac Enzymes: No results for input(s): CKTOTAL, CKMB, CKMBINDEX, TROPONINI in the last 168 hours. BNP (last 3 results) No results for input(s): PROBNP in the last 8760 hours. HbA1C: No results for input(s): HGBA1C in the last 72 hours. CBG: No results for input(s): GLUCAP in the last 168 hours. Lipid Profile: No results for input(s): CHOL, HDL, LDLCALC, TRIG, CHOLHDL, LDLDIRECT in the last 72 hours. Thyroid Function Tests: No results for input(s): TSH, T4TOTAL, FREET4, T3FREE, THYROIDAB in the last 72 hours. Anemia Panel: Recent Labs    07/20/21 1612  FERRITIN 2*  TIBC 470*  IRON 11*   Sepsis Labs: No results for input(s): PROCALCITON, LATICACIDVEN in the last 168 hours.  Recent Results (from the past 240 hour(s))  Resp Panel by RT-PCR (Flu A&B, Covid) Nasopharyngeal Swab     Status: None   Collection Time: 07/19/21 10:59 PM   Specimen: Nasopharyngeal Swab; Nasopharyngeal(NP) swabs in vial transport medium  Result  Value Ref Range Status   SARS Coronavirus 2 by RT PCR NEGATIVE NEGATIVE Final    Comment: (NOTE) SARS-CoV-2 target nucleic acids are NOT DETECTED.  The SARS-CoV-2 RNA is generally detectable in upper respiratory specimens during the acute phase of infection. The lowest concentration of SARS-CoV-2 viral copies this assay can detect is 138 copies/mL. A negative result does not preclude SARS-Cov-2 infection and should not be used as the sole basis for treatment or other patient management decisions. A negative result may occur with  improper specimen collection/handling, submission of specimen other than nasopharyngeal swab, presence of viral mutation(s) within the areas targeted by this assay, and inadequate number of viral copies(<138 copies/mL). A negative result must be combined with clinical observations, patient history, and epidemiological information. The expected result is Negative.  Fact Sheet for Patients:  EntrepreneurPulse.com.au  Fact Sheet for Healthcare Providers:  IncredibleEmployment.be  This test is no t yet approved or cleared  by the [place] and  has been authorized for detection and/or diagnosis of SARS-CoV-2 by FDA under an Emergency Use Authorization (EUA). This EUA will remain  in effect (meaning this test can be used) for the duration of the COVID-19 declaration under Section 564(b)(1) of the Act, 21 U.S.C.section 360bbb-3(b)(1), unless the authorization is terminated  or revoked sooner.       Influenza A by PCR NEGATIVE NEGATIVE Final   Influenza B by PCR NEGATIVE NEGATIVE Final    Comment: (NOTE) The Xpert Xpress SARS-CoV-2/FLU/RSV plus assay is intended as an aid in the diagnosis of influenza from Nasopharyngeal swab specimens and should not be used as a sole basis for treatment. Nasal washings and aspirates are unacceptable for Xpert Xpress SARS-CoV-2/FLU/RSV testing.  Fact Sheet for  Patients: EntrepreneurPulse.com.au  Fact Sheet for Healthcare Providers: IncredibleEmployment.be  This test is not yet approved or cleared by the [place] FDA and has been authorized for detection and/or diagnosis of SARS-CoV-2 by FDA under an Emergency Use Authorization (EUA). This EUA will remain in effect (meaning this test can be used) for the duration of the COVID-19 declaration under Section 564(b)(1) of the Act, 21 U.S.C. section 360bbb-3(b)(1), unless the authorization is terminated or revoked.  Performed at [facility], [address], [place], [place] [address]          Radiology Studies: CT ABDOMEN PELVIS W CONTRAST  Result Date: 07/19/2021 CLINICAL DATA:  Abdominal distension. EXAM: CT ABDOMEN AND PELVIS WITH CONTRAST TECHNIQUE: Multidetector CT imaging of the abdomen and pelvis was performed using the standard protocol following bolus administration of intravenous contrast. CONTRAST:  16mL OMNIPAQUE IOHEXOL 300 MG/ML  SOLN COMPARISON:  CT of the abdomen pelvis dated 05/20/2013. FINDINGS: Lower chest: Linear atelectasis/scarring in the right middle lobe and lingula. The visualized lung bases are otherwise clear. No intra-abdominal free air or free fluid. Hepatobiliary: Subcentimeter hypodense lesion in the dome of the liver is not characterized. The liver is otherwise unremarkable. No intrahepatic biliary ductal dilatation. Probable small amount of sludge or small stones within gallbladder. No pericholecystic fluid or evidence of acute cholecystitis by CT. Pancreas: Unremarkable. No pancreatic ductal dilatation or surrounding inflammatory changes. Spleen: Normal in size without focal abnormality. Adrenals/Urinary Tract: The adrenal glands, kidneys, visualized ureters, and urinary bladder appear unremarkable. Stomach/Bowel: There is focal area of high-grade narrowing of the proximal ascending colon which may be related to  stricture secondary to chronic inflammation or a mass. There is dilatation of the cecum and distal and terminal ileum. The terminal ileum measures 3.6 cm in diameter. There is thickened and dilated appearance of the appendix measuring 15 mm in thickness. This likely represents distension of the appendix secondary to obstruction of the proximal ascending colon. Acute appendicitis is less likely. Vascular/Lymphatic: The abdominal aorta and IVC are unremarkable. No portal venous gas. There is no adenopathy. Reproductive: The prostate and seminal vesicles are grossly unremarkable. No pelvic mass. Other: Small fat containing umbilical hernia. Musculoskeletal: No acute or significant osseous findings. IMPRESSION: Focal area of high-grade narrowing of the proximal ascending colon may be sequela of chronic inflammation and stricture versus a mass. There is mild distension of the cecum and terminal ileum. Clinical correlation and further evaluation with lower GI study or colonoscopy recommended. Electronically Signed   By: Anner Crete M.D.   On: 07/19/2021 21:30    Scheduled Meds: Continuous Infusions:   LOS: 1 day   Time spent: 57min  Samika Vetsch C Spirit Wernli, DO [facility]  If 7PM-7AM, please contact night-coverage [email or web address]  07/21/2021, 7:37 AM

## 2021-07-21 NOTE — H&P (View-Only) (Signed)
Subjective: Patient reports having 1 bowel movement yesterday and 1 liquid bowel movement today, he has finished one third of his prep.  Objective: Vital signs in last 24 hours: Temp:  [98.3 F (36.8 C)-99.3 F (37.4 C)] 98.3 F (36.8 C) (11/20 0605) Pulse Rate:  [95-107] 104 (11/20 0605) Resp:  [16-18] 16 (11/20 0605) BP: (131-145)/(81-96) 136/87 (11/20 0605) SpO2:  [97 %-100 %] 100 % (11/20 0605) Weight change:  Last BM Date: 07/20/21  PE: Prominent pallor, not in distress GENERAL: Able to speak in full sentences  ABDOMEN: Nondistended, nontender EXTREMITIES: No deformity  Lab Results: Results for orders placed or performed during the hospital encounter of 07/19/21 (from the past 48 hour(s))  Lipase, blood     Status: Abnormal   Collection Time: 07/19/21  7:52 PM  Result Value Ref Range   Lipase 55 (H) 11 - 51 U/L    Comment: Performed at [facility], [address] [place], [place] [address]  Comprehensive metabolic panel     Status: Abnormal   Collection Time: 07/19/21  7:52 PM  Result Value Ref Range   Sodium 136 135 - 145 mmol/L   Potassium 3.9 3.5 - 5.1 mmol/L   Chloride 101 98 - 111 mmol/L   CO2 25 22 - 32 mmol/L   Glucose, Bld 105 (H) 70 - 99 mg/dL    Comment: Glucose reference range applies only to samples taken after fasting for at least 8 hours.   BUN 15 6 - 20 mg/dL   Creatinine, Ser 0.99 0.61 - 1.24 mg/dL   Calcium 9.2 8.9 - 10.3 mg/dL   Total Protein 9.1 (H) 6.5 - 8.1 g/dL   Albumin 4.3 3.5 - 5.0 g/dL   AST 14 (L) 15 - 41 U/L   ALT 9 0 - 44 U/L   Alkaline Phosphatase 69 38 - 126 U/L   Total Bilirubin 0.4 0.3 - 1.2 mg/dL   GFR, Estimated >60 >60 mL/min    Comment: (NOTE) Calculated using the CKD-EPI Creatinine Equation (2021)    Anion gap 10 5 - 15    Comment: Performed at [facility], [address] [place], [place] [address]  CBC     Status: Abnormal   Collection Time: 07/19/21  7:52 PM  Result Value Ref Range   WBC 13.2  (H) 4.0 - 10.5 K/uL   RBC 4.08 (L) 4.22 - 5.81 MIL/uL   Hemoglobin 7.0 (L) 13.0 - 17.0 g/dL    Comment: REPEATED TO VERIFY   HCT 26.1 (L) 39.0 - 52.0 %   MCV 64.0 (L) 80.0 - 100.0 fL   MCH 17.2 (L) 26.0 - 34.0 pg   MCHC 26.8 (L) 30.0 - 36.0 g/dL   RDW 18.2 (H) 11.5 - 15.5 %   Platelets 621 (H) 150 - 400 K/uL   nRBC 0.0 0.0 - 0.2 %    Comment: Performed at [facility], [address] [place], [place] [address]  Occult blood card to lab, stool     Status: None   Collection Time: 07/19/21  9:50 PM  Result Value Ref Range   Fecal Occult Bld NEGATIVE NEGATIVE    Comment: Performed at [facility], [address] [place], [place] [address]  Urinalysis, Routine w reflex microscopic Urine, Clean Catch     Status: None   Collection Time: 07/19/21  9:54 PM  Result Value Ref Range   Color, Urine YELLOW YELLOW  APPearance CLEAR CLEAR   Specific Gravity, Urine 1.005 1.005 - 1.030   pH 6.5 5.0 - 8.0   Glucose, UA NEGATIVE NEGATIVE mg/dL   Hgb urine dipstick NEGATIVE NEGATIVE   Bilirubin Urine NEGATIVE NEGATIVE   Ketones, ur NEGATIVE NEGATIVE mg/dL   Protein, ur NEGATIVE NEGATIVE mg/dL   Nitrite NEGATIVE NEGATIVE   Leukocytes,Ua NEGATIVE NEGATIVE    Comment: Microscopic not done on urines with negative protein, blood, leukocytes, nitrite, or glucose < 500 mg/dL. Performed at [facility], [address] [place], [place] [address]   Resp Panel by RT-PCR (Flu A&B, Covid) Nasopharyngeal Swab     Status: None   Collection Time: 07/19/21 10:59 PM   Specimen: Nasopharyngeal Swab; Nasopharyngeal(NP) swabs in vial transport medium  Result Value Ref Range   SARS Coronavirus 2 by RT PCR NEGATIVE NEGATIVE    Comment: (NOTE) SARS-CoV-2 target nucleic acids are NOT DETECTED.  The SARS-CoV-2 RNA is generally detectable in upper respiratory specimens during the acute phase of infection. The lowest concentration of SARS-CoV-2 viral copies this assay can detect  is 138 copies/mL. A negative result does not preclude SARS-Cov-2 infection and should not be used as the sole basis for treatment or other patient management decisions. A negative result may occur with  improper specimen collection/handling, submission of specimen other than nasopharyngeal swab, presence of viral mutation(s) within the areas targeted by this assay, and inadequate number of viral copies(<138 copies/mL). A negative result must be combined with clinical observations, patient history, and epidemiological information. The expected result is Negative.  Fact Sheet for Patients:  EntrepreneurPulse.com.au  Fact Sheet for Healthcare Providers:  IncredibleEmployment.be  This test is no t yet approved or cleared by the [place] FDA and  has been authorized for detection and/or diagnosis of SARS-CoV-2 by FDA under an Emergency Use Authorization (EUA). This EUA will remain  in effect (meaning this test can be used) for the duration of the COVID-19 declaration under Section 564(b)(1) of the Act, 21 U.S.C.section 360bbb-3(b)(1), unless the authorization is terminated  or revoked sooner.       Influenza A by PCR NEGATIVE NEGATIVE   Influenza B by PCR NEGATIVE NEGATIVE    Comment: (NOTE) The Xpert Xpress SARS-CoV-2/FLU/RSV plus assay is intended as an aid in the diagnosis of influenza from Nasopharyngeal swab specimens and should not be used as a sole basis for treatment. Nasal washings and aspirates are unacceptable for Xpert Xpress SARS-CoV-2/FLU/RSV testing.  Fact Sheet for Patients: EntrepreneurPulse.com.au  Fact Sheet for Healthcare Providers: IncredibleEmployment.be  This test is not yet approved or cleared by the [place] FDA and has been authorized for detection and/or diagnosis of SARS-CoV-2 by FDA under an Emergency Use Authorization (EUA). This EUA will remain in effect (meaning this  test can be used) for the duration of the COVID-19 declaration under Section 564(b)(1) of the Act, 21 U.S.C. section 360bbb-3(b)(1), unless the authorization is terminated or revoked.  Performed at [facility], [address] [place], [place] [address]   CBC with Differential     Status: Abnormal   Collection Time: 07/20/21  7:40 AM  Result Value Ref Range   WBC 8.1 4.0 - 10.5 K/uL   RBC 3.50 (L) 4.22 - 5.81 MIL/uL   Hemoglobin 6.0 (LL) 13.0 - 17.0 g/dL    Comment: REPEATED TO VERIFY Reticulocyte Hemoglobin testing may be clinically indicated, consider ordering this additional test [id number] THIS CRITICAL RESULT HAS VERIFIED AND BEEN CALLED TO PEGRAM,  J BY JOSH BOWLBY ON 11 19 2022 AT 0751, AND HAS BEEN READ BACK.     HCT 22.2 (L) 39.0 - 52.0 %   MCV 63.4 (L) 80.0 - 100.0 fL   MCH 17.1 (L) 26.0 - 34.0 pg   MCHC 27.0 (L) 30.0 - 36.0 g/dL   RDW 18.1 (H) 11.5 - 15.5 %   Platelets 504 (H) 150 - 400 K/uL    Comment: SPECIMEN CHECKED FOR CLOTS REPEATED TO VERIFY    nRBC 0.0 0.0 - 0.2 %   Neutrophils Relative % 66 %   Neutro Abs 5.5 1.7 - 7.7 K/uL   Lymphocytes Relative 25 %   Lymphs Abs 2.0 0.7 - 4.0 K/uL   Monocytes Relative 5 %   Monocytes Absolute 0.4 0.1 - 1.0 K/uL   Eosinophils Relative 2 %   Eosinophils Absolute 0.1 0.0 - 0.5 K/uL   Basophils Relative 1 %   Basophils Absolute 0.1 0.0 - 0.1 K/uL   Immature Granulocytes 1 %   Abs Immature Granulocytes 0.04 0.00 - 0.07 K/uL    Comment: Performed at [facility], [address] [place], [place] [address]  Comprehensive metabolic panel     Status: Abnormal   Collection Time: 07/20/21  7:40 AM  Result Value Ref Range   Sodium 137 135 - 145 mmol/L   Potassium 4.2 3.5 - 5.1 mmol/L   Chloride 105 98 - 111 mmol/L   CO2 25 22 - 32 mmol/L   Glucose, Bld 110 (H) 70 - 99 mg/dL    Comment: Glucose reference range applies only to samples taken after fasting for at least 8 hours.   BUN 9 6 - 20 mg/dL    Creatinine, Ser 0.82 0.61 - 1.24 mg/dL   Calcium 8.6 (L) 8.9 - 10.3 mg/dL   Total Protein 7.8 6.5 - 8.1 g/dL   Albumin 3.5 3.5 - 5.0 g/dL   AST 18 15 - 41 U/L   ALT 8 0 - 44 U/L   Alkaline Phosphatase 63 38 - 126 U/L   Total Bilirubin 0.3 0.3 - 1.2 mg/dL   GFR, Estimated >60 >60 mL/min    Comment: (NOTE) Calculated using the CKD-EPI Creatinine Equation (2021)    Anion gap 7 5 - 15    Comment: Performed at [facility], [address] [place], [place] [address]  Iron and TIBC     Status: Abnormal   Collection Time: 07/20/21  4:12 PM  Result Value Ref Range   Iron 11 (L) 45 - 182 ug/dL   TIBC 470 (H) 250 - 450 ug/dL   Saturation Ratios 2 (L) 17.9 - 39.5 %   UIBC 459 ug/dL    Comment: Performed at [facility], [place] [address], [place], [place] [address]  Ferritin     Status: Abnormal   Collection Time: 07/20/21  4:12 PM  Result Value Ref Range   Ferritin 2 (L) 24 - 336 ng/mL    Comment: Performed at [facility] [place] [name] [place], [place] [address]    Studies/Results: CT ABDOMEN PELVIS W CONTRAST  Result Date: 07/19/2021 CLINICAL DATA:  Abdominal distension. EXAM: CT ABDOMEN AND PELVIS WITH CONTRAST TECHNIQUE: Multidetector CT imaging of the abdomen and pelvis was performed using the standard protocol following bolus administration of intravenous contrast. CONTRAST:  141mL OMNIPAQUE IOHEXOL 300 MG/ML  SOLN COMPARISON:  CT of the abdomen pelvis dated 05/20/2013. FINDINGS: Lower chest: Linear atelectasis/scarring in the  right middle lobe and lingula. The visualized lung bases are otherwise clear. No intra-abdominal free air or free fluid. Hepatobiliary: Subcentimeter hypodense lesion in the dome of the liver is not characterized. The liver is otherwise unremarkable. No intrahepatic biliary ductal dilatation. Probable small amount of sludge or small stones within gallbladder. No pericholecystic fluid or evidence of acute  cholecystitis by CT. Pancreas: Unremarkable. No pancreatic ductal dilatation or surrounding inflammatory changes. Spleen: Normal in size without focal abnormality. Adrenals/Urinary Tract: The adrenal glands, kidneys, visualized ureters, and urinary bladder appear unremarkable. Stomach/Bowel: There is focal area of high-grade narrowing of the proximal ascending colon which may be related to stricture secondary to chronic inflammation or a mass. There is dilatation of the cecum and distal and terminal ileum. The terminal ileum measures 3.6 cm in diameter. There is thickened and dilated appearance of the appendix measuring 15 mm in thickness. This likely represents distension of the appendix secondary to obstruction of the proximal ascending colon. Acute appendicitis is less likely. Vascular/Lymphatic: The abdominal aorta and IVC are unremarkable. No portal venous gas. There is no adenopathy. Reproductive: The prostate and seminal vesicles are grossly unremarkable. No pelvic mass. Other: Small fat containing umbilical hernia. Musculoskeletal: No acute or significant osseous findings. IMPRESSION: Focal area of high-grade narrowing of the proximal ascending colon may be sequela of chronic inflammation and stricture versus a mass. There is mild distension of the cecum and terminal ileum. Clinical correlation and further evaluation with lower GI study or colonoscopy recommended. Electronically Signed   By: Anner Crete M.D.   On: 07/19/2021 21:30    Medications: I have reviewed the patient's current medications.  Assessment: Abnormal CT: Focal high-grade narrowing in proximal ascending?  Chronic inflammation?  Stricture?  Mass Severe microcytic iron deficiency anemia  Plan: Patient was not sure about blood transfusion yesterday, I was advised by his hospitalist that he is agreeable for 2 unit transfusion. He has finished one third of his prep from yesterday, advised to finish all of that today by noon,  additional prep to be started at 2 PM, for colonoscopy in a.m. to be performed with Dr. Alessandra Bevels.  Ronnette Juniper, MD 07/21/2021, 9:32 AM

## 2021-07-21 NOTE — Plan of Care (Signed)

## 2021-07-21 NOTE — Progress Notes (Signed)
Subjective: Patient reports having 1 bowel movement yesterday and 1 liquid bowel movement today, he has finished one third of his prep.  Objective: Vital signs in last 24 hours: Temp:  [98.3 F (36.8 C)-99.3 F (37.4 C)] 98.3 F (36.8 C) (11/20 0605) Pulse Rate:  [95-107] 104 (11/20 0605) Resp:  [16-18] 16 (11/20 0605) BP: (131-145)/(81-96) 136/87 (11/20 0605) SpO2:  [97 %-100 %] 100 % (11/20 0605) Weight change:  Last BM Date: 07/20/21  PE: Prominent pallor, not in distress GENERAL: Able to speak in full sentences  ABDOMEN: Nondistended, nontender EXTREMITIES: No deformity  Lab Results: Results for orders placed or performed during the hospital encounter of 07/19/21 (from the past 48 hour(s))  Lipase, blood     Status: Abnormal   Collection Time: 07/19/21  7:52 PM  Result Value Ref Range   Lipase 55 (H) 11 - 51 U/L    Comment: Performed at [facility], [address] [place], [place] [address]  Comprehensive metabolic panel     Status: Abnormal   Collection Time: 07/19/21  7:52 PM  Result Value Ref Range   Sodium 136 135 - 145 mmol/L   Potassium 3.9 3.5 - 5.1 mmol/L   Chloride 101 98 - 111 mmol/L   CO2 25 22 - 32 mmol/L   Glucose, Bld 105 (H) 70 - 99 mg/dL    Comment: Glucose reference range applies only to samples taken after fasting for at least 8 hours.   BUN 15 6 - 20 mg/dL   Creatinine, Ser 0.99 0.61 - 1.24 mg/dL   Calcium 9.2 8.9 - 10.3 mg/dL   Total Protein 9.1 (H) 6.5 - 8.1 g/dL   Albumin 4.3 3.5 - 5.0 g/dL   AST 14 (L) 15 - 41 U/L   ALT 9 0 - 44 U/L   Alkaline Phosphatase 69 38 - 126 U/L   Total Bilirubin 0.4 0.3 - 1.2 mg/dL   GFR, Estimated >60 >60 mL/min    Comment: (NOTE) Calculated using the CKD-EPI Creatinine Equation (2021)    Anion gap 10 5 - 15    Comment: Performed at [facility], [address] [place], [place] [address]  CBC     Status: Abnormal   Collection Time: 07/19/21  7:52 PM  Result Value Ref Range   WBC 13.2  (H) 4.0 - 10.5 K/uL   RBC 4.08 (L) 4.22 - 5.81 MIL/uL   Hemoglobin 7.0 (L) 13.0 - 17.0 g/dL    Comment: REPEATED TO VERIFY   HCT 26.1 (L) 39.0 - 52.0 %   MCV 64.0 (L) 80.0 - 100.0 fL   MCH 17.2 (L) 26.0 - 34.0 pg   MCHC 26.8 (L) 30.0 - 36.0 g/dL   RDW 18.2 (H) 11.5 - 15.5 %   Platelets 621 (H) 150 - 400 K/uL   nRBC 0.0 0.0 - 0.2 %    Comment: Performed at [facility], [address] [place], [place] [address]  Occult blood card to lab, stool     Status: None   Collection Time: 07/19/21  9:50 PM  Result Value Ref Range   Fecal Occult Bld NEGATIVE NEGATIVE    Comment: Performed at [facility], [address] [place], [place] [address]  Urinalysis, Routine w reflex microscopic Urine, Clean Catch     Status: None   Collection Time: 07/19/21  9:54 PM  Result Value Ref Range   Color, Urine YELLOW YELLOW  APPearance CLEAR CLEAR   Specific Gravity, Urine 1.005 1.005 - 1.030   pH 6.5 5.0 - 8.0   Glucose, UA NEGATIVE NEGATIVE mg/dL   Hgb urine dipstick NEGATIVE NEGATIVE   Bilirubin Urine NEGATIVE NEGATIVE   Ketones, ur NEGATIVE NEGATIVE mg/dL   Protein, ur NEGATIVE NEGATIVE mg/dL   Nitrite NEGATIVE NEGATIVE   Leukocytes,Ua NEGATIVE NEGATIVE    Comment: Microscopic not done on urines with negative protein, blood, leukocytes, nitrite, or glucose < 500 mg/dL. Performed at [facility], [address] [place], [place] [address]   Resp Panel by RT-PCR (Flu A&B, Covid) Nasopharyngeal Swab     Status: None   Collection Time: 07/19/21 10:59 PM   Specimen: Nasopharyngeal Swab; Nasopharyngeal(NP) swabs in vial transport medium  Result Value Ref Range   SARS Coronavirus 2 by RT PCR NEGATIVE NEGATIVE    Comment: (NOTE) SARS-CoV-2 target nucleic acids are NOT DETECTED.  The SARS-CoV-2 RNA is generally detectable in upper respiratory specimens during the acute phase of infection. The lowest concentration of SARS-CoV-2 viral copies this assay can detect  is 138 copies/mL. A negative result does not preclude SARS-Cov-2 infection and should not be used as the sole basis for treatment or other patient management decisions. A negative result may occur with  improper specimen collection/handling, submission of specimen other than nasopharyngeal swab, presence of viral mutation(s) within the areas targeted by this assay, and inadequate number of viral copies(<138 copies/mL). A negative result must be combined with clinical observations, patient history, and epidemiological information. The expected result is Negative.  Fact Sheet for Patients:  EntrepreneurPulse.com.au  Fact Sheet for Healthcare Providers:  IncredibleEmployment.be  This test is no t yet approved or cleared by the [place] FDA and  has been authorized for detection and/or diagnosis of SARS-CoV-2 by FDA under an Emergency Use Authorization (EUA). This EUA will remain  in effect (meaning this test can be used) for the duration of the COVID-19 declaration under Section 564(b)(1) of the Act, 21 U.S.C.section 360bbb-3(b)(1), unless the authorization is terminated  or revoked sooner.       Influenza A by PCR NEGATIVE NEGATIVE   Influenza B by PCR NEGATIVE NEGATIVE    Comment: (NOTE) The Xpert Xpress SARS-CoV-2/FLU/RSV plus assay is intended as an aid in the diagnosis of influenza from Nasopharyngeal swab specimens and should not be used as a sole basis for treatment. Nasal washings and aspirates are unacceptable for Xpert Xpress SARS-CoV-2/FLU/RSV testing.  Fact Sheet for Patients: EntrepreneurPulse.com.au  Fact Sheet for Healthcare Providers: IncredibleEmployment.be  This test is not yet approved or cleared by the [place] FDA and has been authorized for detection and/or diagnosis of SARS-CoV-2 by FDA under an Emergency Use Authorization (EUA). This EUA will remain in effect (meaning this  test can be used) for the duration of the COVID-19 declaration under Section 564(b)(1) of the Act, 21 U.S.C. section 360bbb-3(b)(1), unless the authorization is terminated or revoked.  Performed at [facility], [address] [place], [place] [address]   CBC with Differential     Status: Abnormal   Collection Time: 07/20/21  7:40 AM  Result Value Ref Range   WBC 8.1 4.0 - 10.5 K/uL   RBC 3.50 (L) 4.22 - 5.81 MIL/uL   Hemoglobin 6.0 (LL) 13.0 - 17.0 g/dL    Comment: REPEATED TO VERIFY Reticulocyte Hemoglobin testing may be clinically indicated, consider ordering this additional test [id number] THIS CRITICAL RESULT HAS VERIFIED AND BEEN CALLED TO PEGRAM,  J BY JOSH BOWLBY ON 11 19 2022 AT 0751, AND HAS BEEN READ BACK.     HCT 22.2 (L) 39.0 - 52.0 %   MCV 63.4 (L) 80.0 - 100.0 fL   MCH 17.1 (L) 26.0 - 34.0 pg   MCHC 27.0 (L) 30.0 - 36.0 g/dL   RDW 18.1 (H) 11.5 - 15.5 %   Platelets 504 (H) 150 - 400 K/uL    Comment: SPECIMEN CHECKED FOR CLOTS REPEATED TO VERIFY    nRBC 0.0 0.0 - 0.2 %   Neutrophils Relative % 66 %   Neutro Abs 5.5 1.7 - 7.7 K/uL   Lymphocytes Relative 25 %   Lymphs Abs 2.0 0.7 - 4.0 K/uL   Monocytes Relative 5 %   Monocytes Absolute 0.4 0.1 - 1.0 K/uL   Eosinophils Relative 2 %   Eosinophils Absolute 0.1 0.0 - 0.5 K/uL   Basophils Relative 1 %   Basophils Absolute 0.1 0.0 - 0.1 K/uL   Immature Granulocytes 1 %   Abs Immature Granulocytes 0.04 0.00 - 0.07 K/uL    Comment: Performed at [facility], [address] [place], [place] [address]  Comprehensive metabolic panel     Status: Abnormal   Collection Time: 07/20/21  7:40 AM  Result Value Ref Range   Sodium 137 135 - 145 mmol/L   Potassium 4.2 3.5 - 5.1 mmol/L   Chloride 105 98 - 111 mmol/L   CO2 25 22 - 32 mmol/L   Glucose, Bld 110 (H) 70 - 99 mg/dL    Comment: Glucose reference range applies only to samples taken after fasting for at least 8 hours.   BUN 9 6 - 20 mg/dL    Creatinine, Ser 0.82 0.61 - 1.24 mg/dL   Calcium 8.6 (L) 8.9 - 10.3 mg/dL   Total Protein 7.8 6.5 - 8.1 g/dL   Albumin 3.5 3.5 - 5.0 g/dL   AST 18 15 - 41 U/L   ALT 8 0 - 44 U/L   Alkaline Phosphatase 63 38 - 126 U/L   Total Bilirubin 0.3 0.3 - 1.2 mg/dL   GFR, Estimated >60 >60 mL/min    Comment: (NOTE) Calculated using the CKD-EPI Creatinine Equation (2021)    Anion gap 7 5 - 15    Comment: Performed at [facility], [address] [place], [place] [address]  Iron and TIBC     Status: Abnormal   Collection Time: 07/20/21  4:12 PM  Result Value Ref Range   Iron 11 (L) 45 - 182 ug/dL   TIBC 470 (H) 250 - 450 ug/dL   Saturation Ratios 2 (L) 17.9 - 39.5 %   UIBC 459 ug/dL    Comment: Performed at [facility], [place] [address] [place], [place] [address]  Ferritin     Status: Abnormal   Collection Time: 07/20/21  4:12 PM  Result Value Ref Range   Ferritin 2 (L) 24 - 336 ng/mL    Comment: Performed at [facility], [place] [address] [place], [place] [address]    Studies/Results: CT ABDOMEN PELVIS W CONTRAST  Result Date: 07/19/2021 CLINICAL DATA:  Abdominal distension. EXAM: CT ABDOMEN AND PELVIS WITH CONTRAST TECHNIQUE: Multidetector CT imaging of the abdomen and pelvis was performed using the standard protocol following bolus administration of intravenous contrast. CONTRAST:  178mL OMNIPAQUE IOHEXOL 300 MG/ML  SOLN COMPARISON:  CT of the abdomen pelvis dated 05/20/2013. FINDINGS: Lower chest: Linear atelectasis/scarring in the  right middle lobe and lingula. The visualized lung bases are otherwise clear. No intra-abdominal free air or free fluid. Hepatobiliary: Subcentimeter hypodense lesion in the dome of the liver is not characterized. The liver is otherwise unremarkable. No intrahepatic biliary ductal dilatation. Probable small amount of sludge or small stones within gallbladder. No pericholecystic fluid or evidence of acute  cholecystitis by CT. Pancreas: Unremarkable. No pancreatic ductal dilatation or surrounding inflammatory changes. Spleen: Normal in size without focal abnormality. Adrenals/Urinary Tract: The adrenal glands, kidneys, visualized ureters, and urinary bladder appear unremarkable. Stomach/Bowel: There is focal area of high-grade narrowing of the proximal ascending colon which may be related to stricture secondary to chronic inflammation or a mass. There is dilatation of the cecum and distal and terminal ileum. The terminal ileum measures 3.6 cm in diameter. There is thickened and dilated appearance of the appendix measuring 15 mm in thickness. This likely represents distension of the appendix secondary to obstruction of the proximal ascending colon. Acute appendicitis is less likely. Vascular/Lymphatic: The abdominal aorta and IVC are unremarkable. No portal venous gas. There is no adenopathy. Reproductive: The prostate and seminal vesicles are grossly unremarkable. No pelvic mass. Other: Small fat containing umbilical hernia. Musculoskeletal: No acute or significant osseous findings. IMPRESSION: Focal area of high-grade narrowing of the proximal ascending colon may be sequela of chronic inflammation and stricture versus a mass. There is mild distension of the cecum and terminal ileum. Clinical correlation and further evaluation with lower GI study or colonoscopy recommended. Electronically Signed   By: Anner Crete M.D.   On: 07/19/2021 21:30    Medications: I have reviewed the patient's current medications.  Assessment: Abnormal CT: Focal high-grade narrowing in proximal ascending?  Chronic inflammation?  Stricture?  Mass Severe microcytic iron deficiency anemia  Plan: Patient was not sure about blood transfusion yesterday, I was advised by his hospitalist that he is agreeable for 2 unit transfusion. He has finished one third of his prep from yesterday, advised to finish all of that today by noon,  additional prep to be started at 2 PM, for colonoscopy in a.m. to be performed with Dr. Alessandra Bevels.  Ronnette Juniper, MD 07/21/2021, 9:32 AM

## 2021-07-22 ENCOUNTER — Encounter (HOSPITAL_COMMUNITY): Payer: Self-pay | Admitting: Internal Medicine

## 2021-07-22 ENCOUNTER — Inpatient Hospital Stay (HOSPITAL_COMMUNITY): Payer: Medicaid Other | Admitting: Certified Registered"

## 2021-07-22 ENCOUNTER — Encounter (HOSPITAL_COMMUNITY)
Admission: EM | Disposition: A | Discharge status: Home / Self Care | Payer: Self-pay | Source: Home / Self Care | Attending: Internal Medicine

## 2021-07-22 DIAGNOSIS — K6389 Other specified diseases of intestine: Secondary | ICD-10-CM | POA: Diagnosis not present

## 2021-07-22 HISTORY — PX: SUBMUCOSAL TATTOO INJECTION: SHX6856

## 2021-07-22 HISTORY — PX: BIOPSY: SHX5522

## 2021-07-22 HISTORY — PX: COLONOSCOPY WITH PROPOFOL: SHX5780

## 2021-07-22 LAB — TYPE AND SCREEN
ABO/RH(D): O POS
Antibody Screen: NEGATIVE
Unit division: 0
Unit division: 0

## 2021-07-22 LAB — BPAM RBC
Blood Product Expiration Date: 202212212359
Blood Product Expiration Date: 202212212359
ISSUE DATE / TIME: 202211201616
ISSUE DATE / TIME: 202211202020
Unit Type and Rh: 5100
Unit Type and Rh: 5100

## 2021-07-22 LAB — HEMOGLOBIN AND HEMATOCRIT, BLOOD
HCT: 30.1 % — ABNORMAL LOW (ref 39.0–52.0)
Hemoglobin: 8.6 g/dL — ABNORMAL LOW (ref 13.0–17.0)

## 2021-07-22 SURGERY — COLONOSCOPY WITH PROPOFOL
Anesthesia: Monitor Anesthesia Care

## 2021-07-22 MED ORDER — LACTATED RINGERS IV SOLN: INTRAVENOUS | Status: DC

## 2021-07-22 MED ORDER — SODIUM CHLORIDE 0.9 % IV SOLN
2.0000 g | INTRAVENOUS | Status: AC
  Administered 2021-07-23: 2 g via INTRAVENOUS
  Filled 2021-07-22: qty 2

## 2021-07-22 MED ORDER — SODIUM CHLORIDE 0.9 % IV SOLN: INTRAVENOUS | Status: DC

## 2021-07-22 MED ORDER — SPOT INK MARKER SYRINGE KIT
PACK | SUBMUCOSAL | Status: AC
  Filled 2021-07-22: qty 5

## 2021-07-22 MED ORDER — PROPOFOL 500 MG/50ML IV EMUL
INTRAVENOUS | Status: DC | PRN
Start: 2021-07-22 — End: 2021-07-22
  Administered 2021-07-22: 150 ug/kg/min via INTRAVENOUS

## 2021-07-22 MED ORDER — CHLORHEXIDINE GLUCONATE CLOTH 2 % EX PADS
6.0000 | MEDICATED_PAD | Freq: Once | CUTANEOUS | Status: AC
  Administered 2021-07-22: 6 via TOPICAL

## 2021-07-22 MED ORDER — CHLORHEXIDINE GLUCONATE CLOTH 2 % EX PADS
6.0000 | MEDICATED_PAD | Freq: Once | CUTANEOUS | Status: AC
  Administered 2021-07-23: 6 via TOPICAL

## 2021-07-22 MED ORDER — ALVIMOPAN 12 MG PO CAPS
12.0000 mg | ORAL_CAPSULE | ORAL | Status: AC
  Administered 2021-07-23: 12 mg via ORAL
  Filled 2021-07-22: qty 1

## 2021-07-22 MED ORDER — LIDOCAINE 2% (20 MG/ML) 5 ML SYRINGE
INTRAMUSCULAR | Status: DC | PRN
  Administered 2021-07-22: 100 mg via INTRAVENOUS

## 2021-07-22 MED ORDER — SPOT INK MARKER SYRINGE KIT
PACK | SUBMUCOSAL | Status: DC | PRN
  Administered 2021-07-22: 1 mL via SUBMUCOSAL

## 2021-07-22 SURGICAL SUPPLY — 22 items

## 2021-07-22 NOTE — Transfer of Care (Signed)
Immediate Anesthesia Transfer of Care Note  Patient: Joseph Haney  Procedure(s) Performed: COLONOSCOPY WITH PROPOFOL BIOPSY SUBMUCOSAL TATTOO INJECTION  Patient Location: Endoscopy Unit  Anesthesia Type:MAC  Level of Consciousness: sedated, patient cooperative and responds to stimulation  Airway & Oxygen Therapy: Patient Spontanous Breathing and Patient connected to face mask oxygen  Post-op Assessment: Report given to RN and Post -op Vital signs reviewed and stable  Post vital signs: Reviewed and stable  Last Vitals:  Vitals Value Taken Time  BP    Temp    Pulse    Resp    SpO2      Last Pain:  Vitals:   07/22/21 1209  TempSrc: Oral  PainSc: 8       Patients Stated Pain Goal: 3 (71/25/27 1292)  Complications: No notable events documented.

## 2021-07-22 NOTE — Anesthesia Preprocedure Evaluation (Addendum)
Anesthesia Evaluation  Patient identified by MRN, date of birth, ID band Patient awake    Reviewed: Allergy & Precautions, NPO status , Patient's Chart, lab work & pertinent test results  History of Anesthesia Complications Negative for: history of anesthetic complications  Airway Mallampati: II  TM Distance: >3 FB Neck ROM: Full    Dental no notable dental hx.    Pulmonary neg pulmonary ROS,    Pulmonary exam normal        Cardiovascular negative cardio ROS Normal cardiovascular exam     Neuro/Psych Seizures - (last seizure several years ago), Well Controlled,  negative psych ROS   GI/Hepatic GERD  Medicated and Controlled,(+)     substance abuse (remote history)  alcohol use, GIB   Endo/Other  negative endocrine ROS  Renal/GU negative Renal ROS  negative genitourinary   Musculoskeletal negative musculoskeletal ROS (+)   Abdominal   Peds  Hematology  (+) JEHOVAH'S WITNESSHgb 8.0   Anesthesia Other Findings Day of surgery medications reviewed with patient.  Reproductive/Obstetrics negative OB ROS                           Anesthesia Physical Anesthesia Plan  ASA: 3  Anesthesia Plan: MAC   Post-op Pain Management: Minimal or no pain anticipated   Induction:   PONV Risk Score and Plan: Treatment may vary due to age or medical condition and Propofol infusion  Airway Management Planned: Natural Airway and Nasal Cannula  Additional Equipment: None  Intra-op Plan:   Post-operative Plan:   Informed Consent: I have reviewed the patients History and Physical, chart, labs and discussed the procedure including the risks, benefits and alternatives for the proposed anesthesia with the patient or authorized representative who has indicated his/her understanding and acceptance.       Plan Discussed with: CRNA  Anesthesia Plan Comments: (Blood product refusal listed on patient's  chart, however patient has received pRBCs this morning. Discussed with patient, he accepts blood products during this admission. Daiva Huge, MD)       Anesthesia Quick Evaluation

## 2021-07-22 NOTE — Interval H&P Note (Signed)
History and Physical Interval Note:  07/22/2021 12:10 PM  Joseph Haney  has presented today for surgery, with the diagnosis of Abnormal CT, anemia.  The various methods of treatment have been discussed with the patient and family. After consideration of risks, benefits and other options for treatment, the patient has consented to  Procedure(s): COLONOSCOPY WITH PROPOFOL (N/A) as a surgical intervention.  The patient's history has been reviewed, patient examined, no change in status, stable for surgery.  I have reviewed the patient's chart and labs.  Questions were answered to the patient's satisfaction.     Terrisa Curfman

## 2021-07-22 NOTE — Progress Notes (Signed)
PROGRESS NOTE    Joseph Haney  QAS:[id number] DOB: [birth year] DOA: 07/19/2021 PCP: Merryl Hacker, No   Brief Narrative:  Joseph Haney is a 45 y.o. male with medical history significant of hypertension, gastroesophageal reflux disease and remote history of alcohol abuse, and unspecified seizure disorder who presents with a 1 month history of abdominal pain, nausea and vomiting that have greatly accelerated over the past week. CT abd/pelvis show focal area of high grade narrowing at proximal ascending colon. GI consulted and hospitalist called for admission.  Of note patient has not had follow up in > 2 years since PCP office closed and has had no follow up or medication refills in that time frame. Of note patient has been attempting to 'diet' which consisted of eating only mandarins for the past 2 months. Labs concerning for profound anemia without clear source, no active signs of bleeding per the patient.  Assessment/Plan   Intractable nausea, vomiting, and abdominal pain Unspecified ascending proximal colon mass -GI following - appreciate insight/recs  -Status post transfusion hemoglobin currently 8.6, endoscopy pending per GI schedule -NPO except for meds and sips -Supportive care with pain/nausea/vomiting medication as necessary   Acute symptomatic anemia, POA Rule out acute bleed vs iron deficiency -Fe 11, TIB 470, ferritin 2 - likely complicated by his poor diet over the past 2 months -FOBT pending -Patient agreeable for transfusion 2 unit PRBC transfused 07/21/21 - repeat Hgb 8.6 -Hold further lab draws unless absolutely necessary   Hypertension, essential Home med list currently contains carvedilol, needs to be verified   Alcohol use/abuse No recent alcohol use (months) no indication for CIWA   GERD PPI per GI as indicated   Seizure disorder Continue Keppra 500 BID (can be IV if necessary around endoscopy) **Patient indicates he is no longer taking keppra - this appears to  be due to no follow up and no refills; discussed restarting this medication but he currently refused.  DVT prophylaxis: None given questionable bleeding as above Code Status: Full Family Communication: Mother at bedside  Status is: Inpatient  Dispo: The patient is from: Home              Anticipated d/c is to: Home              Anticipated d/c date is: 24 to 48 hours              Patient currently not medically stable for discharge  Consultants:  GI  Procedures:  Endoscopy as above  Antimicrobials:  None indicated  Subjective: No acute issues or events overnight denies nausea vomiting diarrhea constipation headache fevers chills or chest pain, fatigue and weakness ongoing  Objective: Vitals:   07/21/21 2039 07/21/21 2039 07/21/21 2335 07/22/21 0608  BP: (!) 139/93 (!) 139/93 (!) 146/98 (!) 148/98  Pulse: (!) 103 (!) 103 99 86  Resp: 18 18 16 16   Temp: 98.9 F (37.2 C) 98.9 F (37.2 C) 97.9 F (36.6 C) 98.5 F (36.9 C)  TempSrc: Oral Oral Oral Oral  SpO2: 100% 100% 100% 100%  Weight:      Height:        Intake/Output Summary (Last 24 hours) at 07/22/2021 0730 Last data filed at 07/22/2021 7989 Gross per 24 hour  Intake 1020 ml  Output --  Net 1020 ml    Filed Weights   07/19/21 1942  Weight: 77.1 kg    Examination:  General exam: Appears calm and comfortable  Respiratory system: Clear to auscultation. Respiratory effort normal.  Cardiovascular system: S1 & S2 heard, RRR. No JVD, murmurs, rubs, gallops or clicks. No pedal edema. Gastrointestinal system: Abdomen is nondistended, soft and nontender. No organomegaly or masses felt. Normal bowel sounds heard. Central nervous system: Alert and oriented. No focal neurological deficits. Extremities: Symmetric 5 x 5 power. Skin: No rashes, lesions or ulcers Psychiatry: Judgement and insight appear normal. Mood & affect appropriate.   Data Reviewed: I have personally reviewed following labs and imaging  studies  CBC: Recent Labs  Lab 07/19/21 1952 07/20/21 0740 07/22/21 0351  WBC 13.2* 8.1  --   NEUTROABS  --  5.5  --   HGB 7.0* 6.0* 8.6*  HCT 26.1* 22.2* 30.1*  MCV 64.0* 63.4*  --   PLT 621* 504*  --     Basic Metabolic Panel: Recent Labs  Lab 07/19/21 1952 07/20/21 0740  NA 136 137  K 3.9 4.2  CL 101 105  CO2 25 25  GLUCOSE 105* 110*  BUN 15 9  CREATININE 0.99 0.82  CALCIUM 9.2 8.6*    GFR: Estimated Creatinine Clearance: 111.4 mL/min (by C-G formula based on SCr of 0.82 mg/dL). Liver Function Tests: Recent Labs  Lab 07/19/21 1952 07/20/21 0740  AST 14* 18  ALT 9 8  ALKPHOS 69 63  BILITOT 0.4 0.3  PROT 9.1* 7.8  ALBUMIN 4.3 3.5    Recent Labs  Lab 07/19/21 1952  LIPASE 55*    No results for input(s): AMMONIA in the last 168 hours. Coagulation Profile: No results for input(s): INR, PROTIME in the last 168 hours. Cardiac Enzymes: No results for input(s): CKTOTAL, CKMB, CKMBINDEX, TROPONINI in the last 168 hours. BNP (last 3 results) No results for input(s): PROBNP in the last 8760 hours. HbA1C: No results for input(s): HGBA1C in the last 72 hours. CBG: No results for input(s): GLUCAP in the last 168 hours. Lipid Profile: No results for input(s): CHOL, HDL, LDLCALC, TRIG, CHOLHDL, LDLDIRECT in the last 72 hours. Thyroid Function Tests: No results for input(s): TSH, T4TOTAL, FREET4, T3FREE, THYROIDAB in the last 72 hours. Anemia Panel: Recent Labs    07/20/21 1612  FERRITIN 2*  TIBC 470*  IRON 11*    Sepsis Labs: No results for input(s): PROCALCITON, LATICACIDVEN in the last 168 hours.  Recent Results (from the past 240 hour(s))  Resp Panel by RT-PCR (Flu A&B, Covid) Nasopharyngeal Swab     Status: None   Collection Time: 07/19/21 10:59 PM   Specimen: Nasopharyngeal Swab; Nasopharyngeal(NP) swabs in vial transport medium  Result Value Ref Range Status   SARS Coronavirus 2 by RT PCR NEGATIVE NEGATIVE Final    Comment:  (NOTE) SARS-CoV-2 target nucleic acids are NOT DETECTED.  The SARS-CoV-2 RNA is generally detectable in upper respiratory specimens during the acute phase of infection. The lowest concentration of SARS-CoV-2 viral copies this assay can detect is 138 copies/mL. A negative result does not preclude SARS-Cov-2 infection and should not be used as the sole basis for treatment or other patient management decisions. A negative result may occur with  improper specimen collection/handling, submission of specimen other than nasopharyngeal swab, presence of viral mutation(s) within the areas targeted by this assay, and inadequate number of viral copies(<138 copies/mL). A negative result must be combined with clinical observations, patient history, and epidemiological information. The expected result is Negative.  Fact Sheet for Patients:  EntrepreneurPulse.com.au  Fact Sheet for Healthcare Providers:  IncredibleEmployment.be  This test is no t yet approved or cleared by the [place] and  has been authorized for detection and/or diagnosis of SARS-CoV-2 by FDA under an Emergency Use Authorization (EUA). This EUA will remain  in effect (meaning this test can be used) for the duration of the COVID-19 declaration under Section 564(b)(1) of the Act, 21 U.S.C.section 360bbb-3(b)(1), unless the authorization is terminated  or revoked sooner.       Influenza A by PCR NEGATIVE NEGATIVE Final   Influenza B by PCR NEGATIVE NEGATIVE Final    Comment: (NOTE) The Xpert Xpress SARS-CoV-2/FLU/RSV plus assay is intended as an aid in the diagnosis of influenza from Nasopharyngeal swab specimens and should not be used as a sole basis for treatment. Nasal washings and aspirates are unacceptable for Xpert Xpress SARS-CoV-2/FLU/RSV testing.  Fact Sheet for Patients: EntrepreneurPulse.com.au  Fact Sheet for Healthcare  Providers: IncredibleEmployment.be  This test is not yet approved or cleared by the [place] FDA and has been authorized for detection and/or diagnosis of SARS-CoV-2 by FDA under an Emergency Use Authorization (EUA). This EUA will remain in effect (meaning this test can be used) for the duration of the COVID-19 declaration under Section 564(b)(1) of the Act, 21 U.S.C. section 360bbb-3(b)(1), unless the authorization is terminated or revoked.  Performed at [facility], [address] [place], [place] [address]           Radiology Studies: No results found.  Scheduled Meds: Continuous Infusions:   LOS: 2 days   Time spent: 88min  Kensley Lares C Jeet Shough, DO [facility]  If 7PM-7AM, please contact night-coverage [email or web address]  07/22/2021, 7:30 AM

## 2021-07-22 NOTE — Consult Note (Signed)
[facility] Surgery Consult Note  Joseph Haney 1976/02/28  [id number].    Requesting MD: [name] Chief Complaint/Reason for Consult: colon stricture  HPI:  Joseph Haney is a 45yo male PMH HTN, GERD, and seizure d/o who was admitted to [facility] 11/18 with colon stricture. Patient reports 1 month history of abdominal pain. Pain is diffuse. States that he will hear gurgling in the left then have pain in the right of his abdomen. Over the last week it has become progressively worse. Pain is worse during the day and improves at night. He has been eating a lot of oranges at home and thought this was the culprit so he switched to plumbs with no benefit. Denies any nausea or vomiting. Last BM was this morning. Denies fever, chills, weight loss, or blood in stool. No family h/o colon cancer.  CT abd/pelvis shows focal area of high grade narrowing at proximal ascending colon. Patient underwent colonoscopy by GI today which showed a severe stenosis in the distal ascending colon and was non-traversed, it was ulcerated and inflamed; tattooed, biopsy pending. CEA pending.  General surgery asked to see.  Abdominal surgical history: none Anticoagulants: none Nonsmoker Remote h/o heavy alcohol use - 2017 Employment: not currently employed Lives at home with his fiance  Review of Systems  Gastrointestinal:  Positive for abdominal pain and constipation. Negative for blood in stool, melena, nausea and vomiting.   All systems reviewed and otherwise negative except for as above  Family History  Problem Relation Age of Onset   Coronary artery disease Mother    Coronary artery disease Father    Heart attack Father 56    Past Medical History:  Diagnosis Date   Alcohol abuse    GERD (gastroesophageal reflux disease)    Seizure ([place])     Past Surgical History:  Procedure Laterality Date   NO PAST SURGERIES      Social History:  reports that he has never smoked. He has never used  smokeless tobacco. He reports current alcohol use. He reports that he does not use drugs.  Allergies:  Allergies  Allergen Reactions   Morphine And Related Itching   Other Other (See Comments)    Pt refuses blood products. He is a Sales promotion account executive Witness, but I sopen to talk about receiving blood products.    Medications Prior to Admission  Medication Sig Dispense Refill   cyclobenzaprine (FLEXERIL) 10 MG tablet Take 10-20 mg by mouth 2 (two) times daily as needed for muscle spasms.     gabapentin (NEURONTIN) 300 MG capsule Take 300 mg by mouth See admin instructions. Take 300 mg by mouth three to four times a day     NUCYNTA 75 MG tablet Take 75 mg by mouth See admin instructions. Take 75 mg by mouth four to five times a day as needed for pain     carvedilol (COREG) 6.25 MG tablet Take 2 tablets (12.5 mg total) by mouth 2 (two) times daily with a meal. (Patient not taking: Reported on 07/20/2021) 60 tablet 3   docusate sodium (COLACE) 100 MG capsule Take 100 mg by mouth daily. (Patient not taking: Reported on 07/20/2021)     levETIRAcetam (KEPPRA) 500 MG tablet Take 1 tablet (500 mg total) by mouth 2 (two) times daily. (Patient not taking: Reported on 07/20/2021) 60 tablet 0   loperamide (IMODIUM) 2 MG capsule Take 1 capsule (2 mg total) by mouth as needed for diarrhea or loose stools. (Patient not taking: Reported on 07/20/2021) 30  capsule 0   methocarbamol (ROBAXIN) 500 MG tablet Take 1 tablet (500 mg total) by mouth 2 (two) times daily. (Patient not taking: Reported on 07/20/2021) 20 tablet 0   omeprazole (PRILOSEC) 20 MG capsule Take 1 capsule (20 mg total) by mouth daily. (Patient not taking: Reported on 07/20/2021) 30 capsule 0    Prior to Admission medications   Medication Sig Start Date End Date Taking? Authorizing Provider  cyclobenzaprine (FLEXERIL) 10 MG tablet Take 10-20 mg by mouth 2 (two) times daily as needed for muscle spasms. 06/25/21  Yes [provider]  gabapentin  (NEURONTIN) 300 MG capsule Take 300 mg by mouth See admin instructions. Take 300 mg by mouth three to four times a day 06/25/21  Yes [provider]  NUCYNTA 75 MG tablet Take 75 mg by mouth See admin instructions. Take 75 mg by mouth four to five times a day as needed for pain 06/28/21  Yes [provider]  carvedilol (COREG) 6.25 MG tablet Take 2 tablets (12.5 mg total) by mouth 2 (two) times daily with a meal. Patient not taking: Reported on 07/20/2021 10/12/15   Debbe Odea, MD  docusate sodium (COLACE) 100 MG capsule Take 100 mg by mouth daily. Patient not taking: Reported on 07/20/2021    [provider]  levETIRAcetam (KEPPRA) 500 MG tablet Take 1 tablet (500 mg total) by mouth 2 (two) times daily. Patient not taking: Reported on 07/20/2021 10/12/15   Debbe Odea, MD  loperamide (IMODIUM) 2 MG capsule Take 1 capsule (2 mg total) by mouth as needed for diarrhea or loose stools. Patient not taking: Reported on 07/20/2021 11/30/15   Charlynne Cousins, MD  methocarbamol (ROBAXIN) 500 MG tablet Take 1 tablet (500 mg total) by mouth 2 (two) times daily. Patient not taking: Reported on 07/20/2021 10/14/15   Domenic Moras, [name]  omeprazole (PRILOSEC) 20 MG capsule Take 1 capsule (20 mg total) by mouth daily. Patient not taking: Reported on 07/20/2021 11/20/15   Varney Biles, MD    Blood pressure 109/85, pulse (!) 104, temperature 98.6 F (37 C), temperature source Oral, resp. rate 16, height 5\' 9"  (1.753 m), weight 77.1 kg, SpO2 98 %. Physical Exam: General: pleasant, WD/WN male who is laying in bed in NAD HEENT: head is normocephalic, atraumatic.  Sclera are noninjected.  Pupils equal and round.  Ears and nose without any masses or lesions.  Mouth is pink and moist. Dentition fair Heart: tachycardic.  No obvious murmurs, gallops, or rubs noted.  Palpable pedal pulses bilaterally  Lungs: CTAB, no wheezes, rhonchi, or rales noted.  Respiratory effort nonlabored Abd:  soft, NT/ND, +BS, no masses, hernias, or organomegaly MS: no BUE/BLE edema, calves soft and nontender Skin: warm and dry with no masses, lesions, or rashes Psych: A&Ox4 with an appropriate affect Neuro: cranial nerves grossly intact, equal strength in BUE/BLE bilaterally, normal speech, thought process intact  Results for orders placed or performed during the hospital encounter of 07/19/21 (from the past 48 hour(s))  Iron and TIBC     Status: Abnormal   Collection Time: 07/20/21  4:12 PM  Result Value Ref Range   Iron 11 (L) 45 - 182 ug/dL   TIBC 470 (H) 250 - 450 ug/dL   Saturation Ratios 2 (L) 17.9 - 39.5 %   UIBC 459 ug/dL    Comment: Performed at [facility], [place] [address] [place], [place] [address]  Ferritin     Status: Abnormal   Collection Time: 07/20/21  4:12 PM  Result Value Ref Range   Ferritin 2 (L) 24 - 336 ng/mL    Comment: Performed at [facility], [place] [address], [place], [place] [address]  Prepare RBC (crossmatch)     Status: None   Collection Time: 07/21/21  9:23 AM  Result Value Ref Range   Order Confirmation      ORDER PROCESSED BY BLOOD BANK Performed at [facility], [place] [address], [place], [place] [address]   Type and screen [place]     Status: None   Collection Time: 07/21/21 10:37 AM  Result Value Ref Range   ABO/RH(D) O POS    Antibody Screen NEG    Sample Expiration 07/24/2021,2359    Unit Number [id number]    Blood Component Type RED CELLS,LR    Unit division 00    Status of Unit ISSUED,FINAL    Transfusion Status OK TO TRANSFUSE    Crossmatch Result Compatible    Unit Number [id number]    Blood Component Type RED CELLS,LR    Unit division 00    Status of Unit ISSUED,FINAL    Transfusion Status OK TO TRANSFUSE    Crossmatch Result      Compatible Performed at [facility], [place] [address] [place], [place] [address]   Hemoglobin and  hematocrit, blood     Status: Abnormal   Collection Time: 07/22/21  3:51 AM  Result Value Ref Range   Hemoglobin 8.6 (L) 13.0 - 17.0 g/dL   HCT 30.1 (L) 39.0 - 52.0 %    Comment: Performed at [facility], [place] [address], [place], [place] [address]   No results found.  Anti-infectives (From admission, onward)    None        Assessment/Plan Large bowel obstruction Ascending colon stricture (inflammatory vs malignant)  - s/p colonoscopy 11/21: A severe stenosis was found in the distal ascending colon and was non-traversed, it was ulcerated and inflamed; tattooed, biopsy pending - CEA pending - Patient is nearly obstructed and will need resection with possible colostomy. He already completed a colon prep for colonoscopy so we will keep him on clear liquids today, NPO after midnight. Plan for laparoscopic right colectomy possible colostomy tomorrow.  ID - none VTE - SCDs, ok for chemical dvt prophylaxis from surgical standpoint FEN - CLD, NPO after midnight Foley - none  HTN GERD Seizure d/o - last seizure 2017, not on medication Remote h/o alcohol abuse - 2017 Microcytic anemia  Margie Billet, [facility] Surgery 07/22/2021, 1:49 PM Please see Amion for pager number during day hours 7:00am-4:30pm

## 2021-07-22 NOTE — Brief Op Note (Signed)
07/19/2021 - 07/22/2021  1:41 PM  PATIENT:  Joseph Haney  45 y.o. male  PRE-OPERATIVE DIAGNOSIS:  Abnormal CT, anemia  POST-OPERATIVE DIAGNOSIS:  ascending colon mass sample sent for biopsy r/o malignancy, ascending colon tattoo marking   PROCEDURE:  Procedure(s): COLONOSCOPY WITH PROPOFOL (N/A) BIOPSY SUBMUCOSAL TATTOO INJECTION  SURGEON:  Surgeon(s) and Role:    * Celica Kotowski, MD - Primary  Findings ---------- -  colonoscopy showed ulcerated and inflamed area in the distal ascending colon causing stricture and partial obstruction.  No scope beyond this area.   biopsied.  Tattooed. - There was also an area in the rectum which look like a fistula.   Recommendations --------------------------- -   Check CEA -  Discussed with surgical PA. He will need surgery because of obstructing lesion. -   Okay to have clear liquid diet today. -  GI will follow   [name] MD, [place] 07/22/2021, 1:43 PM  Contact #  [phone]

## 2021-07-22 NOTE — Op Note (Addendum)
[facility] Patient Name: Joseph Haney Procedure Date: 07/22/2021 MRN: [id number] Attending MD: [name] , MD Date of Birth: 01/09/1976 CSN: [id number] Age: 45 Admit Type: Inpatient Procedure:                Colonoscopy Indications:              Abnormal CT of the GI tract Providers:                [name], MD, Jaci Carrel, RN, Frazier Richards, Technician Referring MD:              Medicines:                Sedation Administered by an Anesthesia Professional Complications:            No immediate complications. Estimated Blood Loss:     Estimated blood loss was minimal. Procedure:                Pre-Anesthesia Assessment:                           - Prior to the procedure, a History and Physical                            was performed, and patient medications and                            allergies were reviewed. The patient's tolerance of                            previous anesthesia was also reviewed. The risks                            and benefits of the procedure and the sedation                            options and risks were discussed with the patient.                            All questions were answered, and informed consent                            was obtained. Prior Anticoagulants: The patient has                            taken no previous anticoagulant or antiplatelet                            agents. ASA Grade Assessment: III - A patient with                            severe systemic disease. After reviewing the risks  and benefits, the patient was deemed in                            satisfactory condition to undergo the procedure.                           After obtaining informed consent, the colonoscope                            was passed under direct vision. Throughout the                            procedure, the patient's blood pressure, pulse, and                             oxygen saturations were monitored continuously. The                            PCF-HQ190L ([id number]) Olympus colonoscope was                            introduced through the anus with the intention of                            advancing to the cecum. The scope was advanced to                            the ascending colon before the procedure was                            aborted. Medications were given. The colonoscopy                            was performed with difficulty due to a partially                            obstructing mass. The patient tolerated the                            procedure well. The quality of the bowel                            preparation was fair. Scope In: Scope Out: Findings:      Hemorrhoids were found on perianal exam.      A severe stenosis was found in the distal ascending colon and was       non-traversed. It was ulcerated and inflamed. This could be inflammatory       versus malignant stricture. Biopsies were taken with a cold forceps for       histology. Distal area was tattooed with an injection of Spot (carbon       black).      A ? small fistula was found in the distal rectum.      Internal hemorrhoids were found during retroflexion. The hemorrhoids  were medium-sized. Impression:               - Preparation of the colon was fair.                           - Hemorrhoids found on perianal exam.                           - Stricture in the distal ascending colon.                            Biopsied. Tattooed.                           - Colonic fistula.                           - Internal hemorrhoids. Moderate Sedation:      Moderate (conscious) sedation was personally administered by an       anesthesia professional. The following parameters were monitored: oxygen       saturation, heart rate, blood pressure, and response to care. Recommendation:           - Return patient to hospital ward for ongoing care.                            - Continue present medications.                           - Await pathology results.                           - Clear liquid diet [Duration]. Procedure Code(s):        --- Professional ---                           [phone], 52, Colonoscopy, flexible; with directed                            submucosal injection(s), any substance                           13086, 64, Colonoscopy, flexible; with biopsy,                            single or multiple Diagnosis Code(s):        --- Professional ---                           K64.8, Other hemorrhoids                           D49.0, Neoplasm of unspecified behavior of                            digestive system                           K56.690, Other partial  intestinal obstruction                           K63.2, Fistula of intestine                           R93.3, Abnormal findings on diagnostic imaging of                            other parts of digestive tract CPT copyright 2019 American Medical Association. All rights reserved. The codes documented in this report are preliminary and upon coder review may  be revised to meet current compliance requirements. [name], MD [name], MD 07/22/2021 1:40:43 PM Number of Addenda: 0

## 2021-07-22 NOTE — Anesthesia Postprocedure Evaluation (Signed)
Anesthesia Post Note  Patient: Joseph Haney  Procedure(s) Performed: COLONOSCOPY WITH PROPOFOL BIOPSY SUBMUCOSAL TATTOO INJECTION     Patient location during evaluation: PACU Anesthesia Type: MAC Level of consciousness: awake and alert and oriented Pain management: pain level controlled Vital Signs Assessment: post-procedure vital signs reviewed and stable Respiratory status: spontaneous breathing, nonlabored ventilation and respiratory function stable Cardiovascular status: blood pressure returned to baseline Postop Assessment: no apparent nausea or vomiting Anesthetic complications: no   No notable events documented.  Last Vitals:  Vitals:   07/22/21 1327 07/22/21 1337  BP: 116/73 109/85  Pulse: 99 (!) 104  Resp: 16 16  Temp:    SpO2: 97% 98%    Last Pain:  Vitals:   07/22/21 1337  TempSrc:   PainSc: 0-No pain                 Marthenia Rolling

## 2021-07-23 ENCOUNTER — Encounter (HOSPITAL_COMMUNITY): Payer: Self-pay | Admitting: Internal Medicine

## 2021-07-23 ENCOUNTER — Inpatient Hospital Stay (HOSPITAL_COMMUNITY): Payer: Medicaid Other | Admitting: Certified Registered Nurse Anesthetist

## 2021-07-23 ENCOUNTER — Other Ambulatory Visit: Payer: Self-pay

## 2021-07-23 ENCOUNTER — Encounter (HOSPITAL_COMMUNITY)
Admission: EM | Disposition: A | Discharge status: Home / Self Care | Payer: Self-pay | Source: Home / Self Care | Attending: Internal Medicine

## 2021-07-23 DIAGNOSIS — K6389 Other specified diseases of intestine: Secondary | ICD-10-CM | POA: Diagnosis not present

## 2021-07-23 LAB — SURGICAL PCR SCREEN
MRSA, PCR: NEGATIVE
Staphylococcus aureus: NEGATIVE

## 2021-07-23 LAB — BASIC METABOLIC PANEL
Anion gap: 8 (ref 5–15)
BUN: 9 mg/dL (ref 6–20)
CO2: 26 mmol/L (ref 22–32)
Calcium: 9.4 mg/dL (ref 8.9–10.3)
Chloride: 102 mmol/L (ref 98–111)
Creatinine, Ser: 0.89 mg/dL (ref 0.61–1.24)
GFR, Estimated: >60 mL/min (ref >60)
Glucose, Bld: 98 mg/dL (ref 70–99)
Potassium: 3.5 mmol/L (ref 3.5–5.1)
Sodium: 136 mmol/L (ref 135–145)

## 2021-07-23 LAB — CBC
HCT: 34.1 % — ABNORMAL LOW (ref 39.0–52.0)
Hemoglobin: 9.8 g/dL — ABNORMAL LOW (ref 13.0–17.0)
MCH: 19.9 pg — ABNORMAL LOW (ref 26.0–34.0)
MCHC: 28.7 g/dL — ABNORMAL LOW (ref 30.0–36.0)
MCV: 69.2 fL — ABNORMAL LOW (ref 80.0–100.0)
Platelets: 592 10*3/uL — ABNORMAL HIGH (ref 150–400)
RBC: 4.93 MIL/uL (ref 4.22–5.81)
RDW: 24.8 % — ABNORMAL HIGH (ref 11.5–15.5)
WBC: 9.4 10*3/uL (ref 4.0–10.5)
nRBC: 0 % (ref 0.0–0.2)

## 2021-07-23 LAB — SURGICAL PATHOLOGY

## 2021-07-23 SURGERY — COLECTOMY, PARTIAL, ROBOT-ASSISTED, LAPAROSCOPIC
Anesthesia: General | Site: Abdomen

## 2021-07-23 MED ORDER — ONDANSETRON HCL 4 MG/2ML IJ SOLN
INTRAMUSCULAR | Status: AC
  Filled 2021-07-23: qty 2

## 2021-07-23 MED ORDER — FENTANYL CITRATE (PF) 100 MCG/2ML IJ SOLN
INTRAMUSCULAR | Status: AC
  Filled 2021-07-23: qty 2

## 2021-07-23 MED ORDER — LACTATED RINGERS IV SOLN: INTRAVENOUS | Status: DC

## 2021-07-23 MED ORDER — LACTATED RINGERS IR SOLN
Status: DC | PRN
  Administered 2021-07-23: 1000 mL

## 2021-07-23 MED ORDER — PROPOFOL 500 MG/50ML IV EMUL
INTRAVENOUS | Status: AC
  Filled 2021-07-23: qty 50

## 2021-07-23 MED ORDER — ACETAMINOPHEN 325 MG PO TABS
650.0000 mg | ORAL_TABLET | Freq: Four times a day (QID) | ORAL | Status: DC | PRN
  Administered 2021-07-24: 650 mg via ORAL
  Filled 2021-07-23: qty 2

## 2021-07-23 MED ORDER — PHENYLEPHRINE 40 MCG/ML (10ML) SYRINGE FOR IV PUSH (FOR BLOOD PRESSURE SUPPORT)
PREFILLED_SYRINGE | INTRAVENOUS | Status: AC
  Filled 2021-07-23: qty 10

## 2021-07-23 MED ORDER — KETAMINE HCL 10 MG/ML IJ SOLN
INTRAMUSCULAR | Status: DC | PRN
  Administered 2021-07-23: 30 mg via INTRAVENOUS

## 2021-07-23 MED ORDER — ROCURONIUM BROMIDE 10 MG/ML (PF) SYRINGE
PREFILLED_SYRINGE | INTRAVENOUS | Status: DC | PRN
  Administered 2021-07-23 (×2): 50 mg via INTRAVENOUS
  Administered 2021-07-23: 15 mg via INTRAVENOUS
  Administered 2021-07-23: 20 mg via INTRAVENOUS

## 2021-07-23 MED ORDER — FENTANYL CITRATE (PF) 100 MCG/2ML IJ SOLN
INTRAMUSCULAR | Status: DC | PRN
  Administered 2021-07-23: 50 ug via INTRAVENOUS
  Administered 2021-07-23: 25 ug via INTRAVENOUS
  Administered 2021-07-23: 100 ug via INTRAVENOUS
  Administered 2021-07-23 (×5): 50 ug via INTRAVENOUS
  Administered 2021-07-23: 25 ug via INTRAVENOUS

## 2021-07-23 MED ORDER — FENTANYL CITRATE (PF) 250 MCG/5ML IJ SOLN
INTRAMUSCULAR | Status: AC
  Filled 2021-07-23: qty 5

## 2021-07-23 MED ORDER — LIDOCAINE HCL 2 % IJ SOLN
INTRAMUSCULAR | Status: AC
  Filled 2021-07-23: qty 20

## 2021-07-23 MED ORDER — METHOCARBAMOL 500 MG IVPB - SIMPLE MED
500.0000 mg | Freq: Four times a day (QID) | INTRAVENOUS | Status: DC | PRN
  Administered 2021-07-23: 500 mg via INTRAVENOUS
  Filled 2021-07-23: qty 50

## 2021-07-23 MED ORDER — ONDANSETRON HCL 4 MG/2ML IJ SOLN
4.0000 mg | INTRAMUSCULAR | Status: DC | PRN
  Administered 2021-07-23 – 2021-07-30 (×8): 4 mg via INTRAVENOUS
  Filled 2021-07-23 (×8): qty 2

## 2021-07-23 MED ORDER — ROCURONIUM BROMIDE 10 MG/ML (PF) SYRINGE
PREFILLED_SYRINGE | INTRAVENOUS | Status: AC
  Filled 2021-07-23: qty 10

## 2021-07-23 MED ORDER — HYDROMORPHONE HCL 1 MG/ML IJ SOLN
0.2500 mg | INTRAMUSCULAR | Status: DC | PRN
  Administered 2021-07-23 (×4): 0.5 mg via INTRAVENOUS

## 2021-07-23 MED ORDER — HYDROMORPHONE HCL 1 MG/ML IJ SOLN
INTRAMUSCULAR | Status: AC
  Filled 2021-07-23: qty 1

## 2021-07-23 MED ORDER — DIPHENHYDRAMINE HCL 50 MG/ML IJ SOLN: 12.5000 mg | Freq: Four times a day (QID) | INTRAMUSCULAR | Status: DC | PRN

## 2021-07-23 MED ORDER — 0.9 % SODIUM CHLORIDE (POUR BTL) OPTIME
TOPICAL | Status: DC | PRN
  Administered 2021-07-23: 2000 mL

## 2021-07-23 MED ORDER — CHLORHEXIDINE GLUCONATE CLOTH 2 % EX PADS: 6.0000 | MEDICATED_PAD | Freq: Every day | CUTANEOUS | Status: DC

## 2021-07-23 MED ORDER — DEXAMETHASONE SODIUM PHOSPHATE 10 MG/ML IJ SOLN
INTRAMUSCULAR | Status: AC
  Filled 2021-07-23: qty 1

## 2021-07-23 MED ORDER — DEXAMETHASONE SODIUM PHOSPHATE 4 MG/ML IJ SOLN
INTRAMUSCULAR | Status: DC | PRN
  Administered 2021-07-23: 10 mg via INTRAVENOUS

## 2021-07-23 MED ORDER — MIDAZOLAM HCL 2 MG/2ML IJ SOLN
INTRAMUSCULAR | Status: AC
  Filled 2021-07-23: qty 2

## 2021-07-23 MED ORDER — BUPIVACAINE-EPINEPHRINE (PF) 0.25% -1:200000 IJ SOLN
INTRAMUSCULAR | Status: DC | PRN
  Administered 2021-07-23: 30 mL via PERINEURAL

## 2021-07-23 MED ORDER — ONDANSETRON HCL 4 MG/2ML IJ SOLN
INTRAMUSCULAR | Status: DC | PRN
  Administered 2021-07-23: 4 mg via INTRAVENOUS

## 2021-07-23 MED ORDER — PROPOFOL 500 MG/50ML IV EMUL
INTRAVENOUS | Status: DC | PRN
  Administered 2021-07-23: 25 ug/kg/min via INTRAVENOUS

## 2021-07-23 MED ORDER — ACETAMINOPHEN 500 MG PO TABS
1000.0000 mg | ORAL_TABLET | Freq: Once | ORAL | Status: AC
  Administered 2021-07-23: 1000 mg via ORAL
  Filled 2021-07-23: qty 2

## 2021-07-23 MED ORDER — MIDAZOLAM HCL 5 MG/5ML IJ SOLN
INTRAMUSCULAR | Status: DC | PRN
  Administered 2021-07-23: 2 mg via INTRAVENOUS

## 2021-07-23 MED ORDER — BUPIVACAINE LIPOSOME 1.3 % IJ SUSP
INTRAMUSCULAR | Status: DC | PRN
  Administered 2021-07-23: 20 mL

## 2021-07-23 MED ORDER — FENTANYL CITRATE PF 50 MCG/ML IJ SOSY
25.0000 ug | PREFILLED_SYRINGE | INTRAMUSCULAR | Status: DC | PRN
  Administered 2021-07-23 (×3): 50 ug via INTRAVENOUS

## 2021-07-23 MED ORDER — METHOCARBAMOL 500 MG IVPB - SIMPLE MED
INTRAVENOUS | Status: AC
  Filled 2021-07-23: qty 50

## 2021-07-23 MED ORDER — OXYCODONE HCL 5 MG PO TABS
5.0000 mg | ORAL_TABLET | ORAL | Status: DC | PRN
Start: 2021-07-23 — End: 2021-07-26
  Administered 2021-07-23 – 2021-07-26 (×7): 10 mg via ORAL
  Filled 2021-07-23 (×7): qty 2
  Filled 2021-07-23: qty 1

## 2021-07-23 MED ORDER — SUGAMMADEX SODIUM 200 MG/2ML IV SOLN
INTRAVENOUS | Status: DC | PRN
Start: 2021-07-23 — End: 2021-07-23
  Administered 2021-07-23: 200 mg via INTRAVENOUS

## 2021-07-23 MED ORDER — MORPHINE SULFATE (PF) 2 MG/ML IV SOLN
2.0000 mg | INTRAVENOUS | Status: DC | PRN
Start: 2021-07-23 — End: 2021-07-23
  Administered 2021-07-23: 4 mg via INTRAVENOUS
  Filled 2021-07-23: qty 2

## 2021-07-23 MED ORDER — PHENYLEPHRINE 40 MCG/ML (10ML) SYRINGE FOR IV PUSH (FOR BLOOD PRESSURE SUPPORT)
PREFILLED_SYRINGE | INTRAVENOUS | Status: DC | PRN
  Administered 2021-07-23: 120 ug via INTRAVENOUS

## 2021-07-23 MED ORDER — FENTANYL CITRATE PF 50 MCG/ML IJ SOSY
PREFILLED_SYRINGE | INTRAMUSCULAR | Status: AC
  Filled 2021-07-23: qty 3

## 2021-07-23 MED ORDER — LIDOCAINE HCL (PF) 2 % IJ SOLN
INTRAMUSCULAR | Status: DC | PRN
  Administered 2021-07-23: 1.5 mg/kg/h via INTRADERMAL

## 2021-07-23 MED ORDER — PROPOFOL 10 MG/ML IV BOLUS
INTRAVENOUS | Status: DC | PRN
  Administered 2021-07-23: 150 mg via INTRAVENOUS
  Administered 2021-07-23: 50 mg via INTRAVENOUS

## 2021-07-23 MED ORDER — BUPIVACAINE-EPINEPHRINE (PF) 0.25% -1:200000 IJ SOLN
INTRAMUSCULAR | Status: AC
  Filled 2021-07-23: qty 30

## 2021-07-23 MED ORDER — HYDROMORPHONE HCL 1 MG/ML IJ SOLN
1.0000 mg | INTRAMUSCULAR | Status: DC | PRN
Start: 2021-07-23 — End: 2021-07-24
  Administered 2021-07-23 – 2021-07-24 (×4): 2 mg via INTRAVENOUS
  Filled 2021-07-23: qty 2
  Filled 2021-07-23: qty 1
  Filled 2021-07-23 (×2): qty 2
  Filled 2021-07-23: qty 1

## 2021-07-23 MED ORDER — BUPIVACAINE LIPOSOME 1.3 % IJ SUSP
INTRAMUSCULAR | Status: AC
  Filled 2021-07-23: qty 20

## 2021-07-23 SURGICAL SUPPLY — 70 items
BAG COUNTER SPONGE SURGICOUNT (BAG) IMPLANT
BAG SPNG CNTER NS LX DISP (BAG)
CANNULA REDUC XI 12-8 STAPL (CANNULA) ×3
CANNULA REDUCER 12-8 DVNC XI (CANNULA) ×2 IMPLANT
CELLS DAT CNTRL 66122 CELL SVR (MISCELLANEOUS) IMPLANT
COVER TIP SHEARS 8 DVNC (MISCELLANEOUS) ×2 IMPLANT
COVER TIP SHEARS 8MM DA VINCI (MISCELLANEOUS) ×3
DEFOGGER SCOPE WARMER CLEARIFY (MISCELLANEOUS) ×3 IMPLANT
DERMABOND ADVANCED (GAUZE/BANDAGES/DRESSINGS) ×1
DERMABOND ADVANCED .7 DNX12 (GAUZE/BANDAGES/DRESSINGS) ×2 IMPLANT
DRAIN CHANNEL 19F RND (DRAIN) IMPLANT
DRAPE ARM DVNC X/XI (DISPOSABLE) ×8 IMPLANT
DRAPE COLUMN DVNC XI (DISPOSABLE) ×2 IMPLANT
DRAPE DA VINCI XI ARM (DISPOSABLE) ×12
DRAPE DA VINCI XI COLUMN (DISPOSABLE) ×3
DRAPE SURG IRRIG POUCH 19X23 (DRAPES) IMPLANT
DRSG OPSITE POSTOP 4X10 (GAUZE/BANDAGES/DRESSINGS) IMPLANT
DRSG OPSITE POSTOP 4X6 (GAUZE/BANDAGES/DRESSINGS) IMPLANT
DRSG OPSITE POSTOP 4X8 (GAUZE/BANDAGES/DRESSINGS) IMPLANT
ELECT REM PT RETURN 15FT ADLT (MISCELLANEOUS) ×3 IMPLANT
EVACUATOR SILICONE 100CC (DRAIN) IMPLANT
GLOVE SURG ENC MOIS LTX SZ7.5 (GLOVE) ×9 IMPLANT
GLOVE SURG NEOPR MICRO LF SZ8 (GLOVE) ×6 IMPLANT
GLOVE SURG UNDER LTX SZ8 (GLOVE) ×15 IMPLANT
GOWN STRL REUS W/TWL XL LVL3 (GOWN DISPOSABLE) ×15 IMPLANT
HOLDER FOLEY CATH W/STRAP (MISCELLANEOUS) ×3 IMPLANT
IRRIG SUCT STRYKERFLOW 2 WTIP (MISCELLANEOUS) ×3
IRRIGATION SUCT STRKRFLW 2 WTP (MISCELLANEOUS) ×2 IMPLANT
KIT TURNOVER KIT A (KITS) ×3 IMPLANT
NEEDLE INSUFFLATION 14GA 120MM (NEEDLE) ×3 IMPLANT
NS IRRIG 1000ML POUR BTL (IV SOLUTION) ×3 IMPLANT
PACK CARDIOVASCULAR III (CUSTOM PROCEDURE TRAY) ×3 IMPLANT
PACK COLON (CUSTOM PROCEDURE TRAY) ×3 IMPLANT
PAD POSITIONING PINK XL (MISCELLANEOUS) ×3 IMPLANT
PROTECTOR NERVE ULNAR (MISCELLANEOUS) ×3 IMPLANT
RELOAD STAPLER 3.5X60 BLU DVNC (STAPLE) ×10 IMPLANT
RTRCTR WOUND ALEXIS 18CM MED (MISCELLANEOUS)
RTRCTR WOUND ALEXIS 18CM SML (INSTRUMENTS) ×3
SAVER CELL AAL HAEMONETICS (INSTRUMENTS) ×2 IMPLANT
SCISSORS LAP 5X35 DISP (ENDOMECHANICALS) ×6 IMPLANT
SEAL CANN UNIV 5-8 DVNC XI (MISCELLANEOUS) ×6 IMPLANT
SEAL XI 5MM-8MM UNIVERSAL (MISCELLANEOUS) ×9
SEALER VESSEL DA VINCI XI (MISCELLANEOUS) ×3
SEALER VESSEL EXT DVNC XI (MISCELLANEOUS) ×2 IMPLANT
SOL ANTI FOG 6CC (MISCELLANEOUS) ×2 IMPLANT
SOLUTION ANTI FOG 6CC (MISCELLANEOUS) ×1
SOLUTION ELECTROLUBE (MISCELLANEOUS) ×3 IMPLANT
STAPLER 60 DA VINCI SURE FORM (STAPLE) ×3
STAPLER 60 SUREFORM DVNC (STAPLE) ×2 IMPLANT
STAPLER CANNULA SEAL DVNC XI (STAPLE) ×2 IMPLANT
STAPLER CANNULA SEAL XI (STAPLE) ×3
STAPLER RELOAD 3.5X60 BLU DVNC (STAPLE) ×10
STAPLER RELOAD 3.5X60 BLUE (STAPLE) ×15
SUT MNCRL AB 4-0 PS2 18 (SUTURE) ×3 IMPLANT
SUT PDS AB 1 TP1 96 (SUTURE) IMPLANT
SUT SILK 2 0 (SUTURE) ×6
SUT SILK 2 0 SH CR/8 (SUTURE) ×3 IMPLANT
SUT SILK 2-0 18XBRD TIE 12 (SUTURE) ×4 IMPLANT
SUT SILK 3 0 (SUTURE) ×6
SUT SILK 3 0 SH 30 (SUTURE) ×3 IMPLANT
SUT SILK 3 0 SH CR/8 (SUTURE) ×6 IMPLANT
SUT SILK 3-0 18XBRD TIE 12 (SUTURE) ×4 IMPLANT
SUT VLOC 180 2-0 6IN GS21 (SUTURE) ×3 IMPLANT
SYS LAPSCP GELPORT 120MM (MISCELLANEOUS)
SYS WOUND ALEXIS 18CM MED (MISCELLANEOUS)
SYSTEM LAPSCP GELPORT 120MM (MISCELLANEOUS) IMPLANT
SYSTEM WOUND ALEXIS 18CM MED (MISCELLANEOUS) IMPLANT
TAPE CLOTH 4X10 WHT NS (GAUZE/BANDAGES/DRESSINGS) IMPLANT
TOWEL OR NON WOVEN STRL DISP B (DISPOSABLE) ×3 IMPLANT
TRAY FOLEY MTR SLVR 16FR STAT (SET/KITS/TRAYS/PACK) ×6 IMPLANT

## 2021-07-23 NOTE — Transfer of Care (Signed)
Immediate Anesthesia Transfer of Care Note  Patient: Joseph Haney  Procedure(s) Performed: XI ROBOT ASSISTED LAPAROSCOPIC RIGHT COLECTOMY (Abdomen)  Patient Location: PACU  Anesthesia Type:General  Level of Consciousness: awake, alert , oriented and patient cooperative  Airway & Oxygen Therapy: Patient Spontanous Breathing and Patient connected to face mask  Post-op Assessment: Report given to RN and Post -op Vital signs reviewed and stable  Post vital signs: Reviewed and stable  Last Vitals:  Vitals Value Taken Time  BP 155/92 07/23/21 1320  Temp    Pulse 100 07/23/21 1323  Resp 16 07/23/21 1323  SpO2 100 % 07/23/21 1323  Vitals shown include unvalidated device data.  Last Pain:  Vitals:   07/23/21 0929  TempSrc: Oral  PainSc:       Patients Stated Pain Goal: 3 (47/12/52 7129)  Complications: No notable events documented.

## 2021-07-23 NOTE — Op Note (Signed)
07/23/2021  1:09 PM  PATIENT:  Joseph Haney  45 y.o. male  PRE-OPERATIVE DIAGNOSIS:  right colon mass  POST-OPERATIVE DIAGNOSIS:  right colon mass  PROCEDURE:  Procedure(s): XI ROBOT ASSISTED LAPAROSCOPIC RIGHT COLECTOMY (N/A)  SURGEON:  Surgeon(s) and Role:    Ralene Ok, MD - Primary ASSISTANTS: none   ANESTHESIA:   local and general  EBL:  20 mL   BLOOD ADMINISTERED:none  DRAINS: none   LOCAL MEDICATIONS USED:  BUPIVICAINE   SPECIMEN:  Source of Specimen:  r colon with tattoo  DISPOSITION OF SPECIMEN:  PATHOLOGY  COUNTS:  YES  TOURNIQUET:  * No tourniquets in log *  DICTATION: .Dragon Dictation Indication procedure: Patient is a 45 year old male who comes in secondary to a right colon mass, microcytic anemia.  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  Patient was taken back to the operating room urgently for right colectomy.  Findings: Patient had no masses seen in the dome of the liver.  There is no apparent peritoneal implants.  The tattoo at colonoscopy was visualized in the right distal colon.  Patient underwent ileocolonic anastomosis.  Details of procedure: After the patient was consented he was taken back to the OR and placed in supine position bilateral SCDs in place.  He underwent general endotracheal intubation.  He was then prepped and draped in standard fashion.  A timeout was called and all facts were verified.  A Veress needle technique was used to insufflate the abdomen to 15 m mercury in the left subcostal margin.  Subsequent to an 8 mm trocar and camera placed intra-abdominally.  There is no injury to intra-abdominal organs.  At this time a working 12 mm trochars placed diagonally to the left lower quadrant 10 cm away.  Subsequent to this an 8 mm trocar was placed approximate centimeter from this again down to the right lower quadrant area as well as a fourth 8 mm trocar.  At this time  patient was positioned.  The small bowel was moved out of the way of the right retroperitoneum.  At this time the robot was docked.  At this time I was able to visualize the mesentery over the duodenum.  This was incised.  At this time I proceeded to dissect the mesentery off the retroperitoneum from a medial to lateral direction.  This was done towards the cecum.  At this time the ileocolic vessels were visualized.  These were doubly ligated using the vessel sealer.  This was done down at the base and origin of the ileocolic artery.  At this time I continue my dissection distally down the colon.  An area just to the right of the right portion of the middle colic artery was chosen for transection.  Dissection was taken down up to the transverse colon.  At this time the cecal attachments were dissected away from the retroperitoneum.  An area was chosen just proximal to the colonic valve.  The mesentery was dissected away from the ileum.  At this time a 60 blue load robotic stapler was used to transect the terminal ileum.  2 firings were required.  At this time the area of the transverse colon was selected and 2 firings were used to transect the transverse colon.  It should be noted that the omentum was divided at this area as well with the vessel loop.  At this time an enterotomy was made on both the terminal ileum and the transverse colon and  intact coli.  This was the area chosen for anastomosis.  At this time a 60 blue load stapler was then used to create the anastomosis.  The common channel was then sewn closed with a 2 oh V-Loc stitch in a running fashion.  An apex stitch was placed using a 2-0 Vicryl x1.  Omentum was brought over the area of the anastomosis.  At this time the robot was undocked.  The 12 Miller trocar site was then extended to help with placement of the [employer] wound tractor.  Once this was placed the specimen was removed from this trocar site area.  At this time Betadine was used to  saturate the wound.  The peritoneum was then reapproximated using a running 0 PDS x1.  The anterior rectus fascia was reapproximated using 0 PDS x1.  At this time the right upper quadrant was irrigated out the effluent was clear.  At this time the skin was reapproximated all port sites using 4 Monocryl subcuticular fashion.  The skin was dressed with Dermabond.  The patient taught the procedure well was taken to the recovery in stable condition.   PLAN OF CARE: Admit to inpatient   PATIENT DISPOSITION:  PACU - hemodynamically stable.   Delay start of Pharmacological VTE agent (>24hrs) due to surgical blood loss or risk of bleeding: not applicable

## 2021-07-23 NOTE — Anesthesia Procedure Notes (Signed)
Procedure Name: Intubation Date/Time: 07/23/2021 11:48 AM Performed by: Claudia Desanctis, CRNA Pre-anesthesia Checklist: Patient identified, Emergency Drugs available, Suction available and Patient being monitored Patient Re-evaluated:Patient Re-evaluated prior to induction Oxygen Delivery Method: Circle system utilized Preoxygenation: Pre-oxygenation with 100% oxygen Induction Type: IV induction Ventilation: Mask ventilation without difficulty Laryngoscope Size: 2 and Miller Grade View: Grade I Tube type: Oral Tube size: 7.5 mm Number of attempts: 1 Airway Equipment and Method: Stylet Placement Confirmation: ETT inserted through vocal cords under direct vision, positive ETCO2 and breath sounds checked- equal and bilateral Secured at: 21 cm Tube secured with: Tape Dental Injury: Teeth and Oropharynx as per pre-operative assessment

## 2021-07-23 NOTE — Progress Notes (Signed)
PROGRESS NOTE    Joseph Haney  LKG:[id number] DOB: 1975-11-02 DOA: 07/19/2021 PCP: Merryl Hacker, No   Brief Narrative:  Joseph Haney is a 45 y.o. male with medical history significant of hypertension, gastroesophageal reflux disease and remote history of alcohol abuse, and unspecified seizure disorder who presents with a 1 month history of abdominal pain, nausea and vomiting that have greatly accelerated over the past week. CT abd/pelvis show focal area of high grade narrowing at proximal ascending colon. GI consulted and hospitalist called for admission.  Of note patient has not had follow up in > 2 years since PCP office closed and has had no follow up or medication refills in that time frame. Of note patient has been attempting to 'diet' which consisted of eating only mandarins for the past 2 months. Labs concerning for profound anemia without clear source, no active signs of bleeding per the patient. Patient later admits to staff that he had been having ' obstructive issues' for weeks if not months where he would eat and later have worsening abdominal pain but would not tell anyone in the family and would sneak off to go vomit to relieve his abdominal pain and pressure.  Assessment/Plan   Intractable nausea, vomiting, and abdominal pain Unspecified ascending proximal colon mass with near obstruction -GI following - appreciate insight/recs - colonoscopy shows profound narrowing mass - biopsied and tattoo was placed -Sx called to evaluate given near obstructing mass -plan for laparoscopic evaluation and right hemi-colectomy -Status post transfusion hemoglobin currently 8.6, endoscopy pending per GI schedule -NPO except for meds and sips -Supportive care with pain/nausea/vomiting medication as necessary   Acute symptomatic anemia, POA Rule out acute bleed vs iron deficiency -Fe 11, TIB 470, ferritin 2 - likely complicated by his poor diet over the past 2 months -FOBT pending -Patient  agreeable for transfusion 2 unit PRBC transfused 07/21/21 - repeat Hgb 8.6 -Hold further lab draws unless absolutely necessary to avoid phlebotomizing the patient and worsening his anemia   Hypertension, essential Home med list currently contains carvedilol, needs to be verified   Alcohol use/abuse No recent alcohol use (months) no indication for CIWA   GERD PPI per GI as indicated   Seizure disorder Continue Keppra 500 BID (can be IV if necessary around endoscopy) **Patient indicates he is no longer taking keppra - this appears to be due to no follow up and no refills; discussed restarting this medication but he currently refused.  DVT prophylaxis: None given profound anemia, likely bleeding and need for surgical procedures as outlined above Code Status: Full Family Communication: Mother and fianc at bedside  Status is: Inpatient  Dispo: The patient is from: Home              Anticipated d/c is to: Home              Anticipated d/c date is: 24 to 48 hours              Patient currently not medically stable for discharge  Consultants:  GI  Procedures:  Endoscopy as above  Antimicrobials:  None indicated  Subjective: No acute issues or events overnight tolerated colonoscopy quite well denies headache fever chills chest pain shortness of breath nausea vomiting diarrhea constipation  Objective: Vitals:   07/22/21 1337 07/22/21 1413 07/22/21 2220 07/23/21 0616  BP: 109/85 (!) 151/87 (!) 144/88 134/71  Pulse: (!) 104 90 98 89  Resp: 16 18 17 16   Temp:  98.3 F (36.8 C) 98.8 F (37.1  C) 98.2 F (36.8 C)  TempSrc:  Oral Oral Oral  SpO2: 98% 100% 99% 100%  Weight:      Height:        Intake/Output Summary (Last 24 hours) at 07/23/2021 0749 Last data filed at 07/22/2021 1306 Gross per 24 hour  Intake 400 ml  Output --  Net 400 ml    Filed Weights   07/19/21 1942 07/22/21 1209  Weight: 77.1 kg 77.1 kg    Examination:  General exam: Appears calm and  comfortable  Respiratory system: Clear to auscultation. Respiratory effort normal. Cardiovascular system: S1 & S2 heard, RRR. No JVD, murmurs, rubs, gallops or clicks. No pedal edema. Gastrointestinal system: Abdomen is nondistended, soft and nontender. No organomegaly or masses felt. Normal bowel sounds heard. Central nervous system: Alert and oriented. No focal neurological deficits. Extremities: Symmetric 5 x 5 power. Skin: No rashes, lesions or ulcers Psychiatry: Judgement and insight appear normal. Mood & affect appropriate.   Data Reviewed: I have personally reviewed following labs and imaging studies  CBC: Recent Labs  Lab 07/19/21 1952 07/20/21 0740 07/22/21 0351 07/23/21 0326  WBC 13.2* 8.1  --  9.4  NEUTROABS  --  5.5  --   --   HGB 7.0* 6.0* 8.6* 9.8*  HCT 26.1* 22.2* 30.1* 34.1*  MCV 64.0* 63.4*  --  69.2*  PLT 621* 504*  --  592*    Basic Metabolic Panel: Recent Labs  Lab 07/19/21 1952 07/20/21 0740 07/23/21 0326  NA 136 137 136  K 3.9 4.2 3.5  CL 101 105 102  CO2 25 25 26   GLUCOSE 105* 110* 98  BUN 15 9 9   CREATININE 0.99 0.82 0.89  CALCIUM 9.2 8.6* 9.4    GFR: Estimated Creatinine Clearance: 102.6 mL/min (by C-G formula based on SCr of 0.89 mg/dL). Liver Function Tests: Recent Labs  Lab 07/19/21 1952 07/20/21 0740  AST 14* 18  ALT 9 8  ALKPHOS 69 63  BILITOT 0.4 0.3  PROT 9.1* 7.8  ALBUMIN 4.3 3.5    Recent Labs  Lab 07/19/21 1952  LIPASE 55*    No results for input(s): AMMONIA in the last 168 hours. Coagulation Profile: No results for input(s): INR, PROTIME in the last 168 hours. Cardiac Enzymes: No results for input(s): CKTOTAL, CKMB, CKMBINDEX, TROPONINI in the last 168 hours. BNP (last 3 results) No results for input(s): PROBNP in the last 8760 hours. HbA1C: No results for input(s): HGBA1C in the last 72 hours. CBG: No results for input(s): GLUCAP in the last 168 hours. Lipid Profile: No results for input(s): CHOL, HDL,  LDLCALC, TRIG, CHOLHDL, LDLDIRECT in the last 72 hours. Thyroid Function Tests: No results for input(s): TSH, T4TOTAL, FREET4, T3FREE, THYROIDAB in the last 72 hours. Anemia Panel: Recent Labs    07/20/21 1612  FERRITIN 2*  TIBC 470*  IRON 11*    Sepsis Labs: No results for input(s): PROCALCITON, LATICACIDVEN in the last 168 hours.  Recent Results (from the past 240 hour(s))  Resp Panel by RT-PCR (Flu A&B, Covid) Nasopharyngeal Swab     Status: None   Collection Time: 07/19/21 10:59 PM   Specimen: Nasopharyngeal Swab; Nasopharyngeal(NP) swabs in vial transport medium  Result Value Ref Range Status   SARS Coronavirus 2 by RT PCR NEGATIVE NEGATIVE Final    Comment: (NOTE) SARS-CoV-2 target nucleic acids are NOT DETECTED.  The SARS-CoV-2 RNA is generally detectable in upper respiratory specimens during the acute phase of infection. The lowest concentration of SARS-CoV-2  viral copies this assay can detect is 138 copies/mL. A negative result does not preclude SARS-Cov-2 infection and should not be used as the sole basis for treatment or other patient management decisions. A negative result may occur with  improper specimen collection/handling, submission of specimen other than nasopharyngeal swab, presence of viral mutation(s) within the areas targeted by this assay, and inadequate number of viral copies(<138 copies/mL). A negative result must be combined with clinical observations, patient history, and epidemiological information. The expected result is Negative.  Fact Sheet for Patients:  EntrepreneurPulse.com.au  Fact Sheet for Healthcare Providers:  IncredibleEmployment.be  This test is no t yet approved or cleared by the [place] FDA and  has been authorized for detection and/or diagnosis of SARS-CoV-2 by FDA under an Emergency Use Authorization (EUA). This EUA will remain  in effect (meaning this test can be used) for the duration  of the COVID-19 declaration under Section 564(b)(1) of the Act, 21 U.S.C.section 360bbb-3(b)(1), unless the authorization is terminated  or revoked sooner.       Influenza A by PCR NEGATIVE NEGATIVE Final   Influenza B by PCR NEGATIVE NEGATIVE Final    Comment: (NOTE) The Xpert Xpress SARS-CoV-2/FLU/RSV plus assay is intended as an aid in the diagnosis of influenza from Nasopharyngeal swab specimens and should not be used as a sole basis for treatment. Nasal washings and aspirates are unacceptable for Xpert Xpress SARS-CoV-2/FLU/RSV testing.  Fact Sheet for Patients: EntrepreneurPulse.com.au  Fact Sheet for Healthcare Providers: IncredibleEmployment.be  This test is not yet approved or cleared by the [place] FDA and has been authorized for detection and/or diagnosis of SARS-CoV-2 by FDA under an Emergency Use Authorization (EUA). This EUA will remain in effect (meaning this test can be used) for the duration of the COVID-19 declaration under Section 564(b)(1) of the Act, 21 U.S.C. section 360bbb-3(b)(1), unless the authorization is terminated or revoked.  Performed at [facility], [address] [place], [place] [address]   Surgical pcr screen     Status: None   Collection Time: 07/23/21  6:07 AM   Specimen: Nasal Mucosa; Nasal Swab  Result Value Ref Range Status   MRSA, PCR NEGATIVE NEGATIVE Final   Staphylococcus aureus NEGATIVE NEGATIVE Final    Comment: (NOTE) The Xpert SA Assay (FDA approved for NASAL specimens in patients 82 years of age and older), is one component of a comprehensive surveillance program. It is not intended to diagnose infection nor to guide or monitor treatment. Performed at [facility], [place] [address] [place], [place] [address]           Radiology Studies: No results found.  Scheduled Meds:  alvimopan  12 mg Oral On Call to OR   Chlorhexidine Gluconate Cloth   6 each Topical Once   Continuous Infusions:  cefoTEtan (CEFOTAN) IV       LOS: 3 days   Time spent: 23min  Joseph [place] C Harmony Sandell, DO [facility]  If 7PM-7AM, please contact night-coverage [email or web address]  07/23/2021, 7:49 AM

## 2021-07-23 NOTE — Plan of Care (Signed)
Plan of care reviewed and discussed with the patient. 

## 2021-07-23 NOTE — Anesthesia Preprocedure Evaluation (Addendum)
Anesthesia Evaluation  Patient identified by MRN, date of birth, ID band Patient awake    Reviewed: Allergy & Precautions, NPO status , Patient's Chart, lab work & pertinent test results, reviewed documented beta blocker date and time   Airway Mallampati: I  TM Distance: >3 FB Neck ROM: Full    Dental no notable dental hx. (+) Teeth Intact, Dental Advisory Given   Pulmonary neg pulmonary ROS,    Pulmonary exam normal breath sounds clear to auscultation       Cardiovascular negative cardio ROS Normal cardiovascular exam Rhythm:Regular Rate:Normal  TTE 2017 - Left ventricle: The cavity size was normal. Systolic function was  normal. The estimated ejection fraction was in the range of 55%  to 60%. Wall motion was normal; there were no regional wall  motion abnormalities.  - Aortic valve: There was trivial regurgitation.   Neuro/Psych Seizures - (previously on keppra, has not taken for a year, last seizure 2016), Well Controlled,  negative psych ROS   GI/Hepatic GERD  Controlled and Medicated,(+)     substance abuse (remote history)  alcohol use,   Endo/Other  negative endocrine ROS  Renal/GU negative Renal ROS  negative genitourinary   Musculoskeletal negative musculoskeletal ROS (+)   Abdominal   Peds  Hematology  (+) Blood dyscrasia (Hgb 9.8), anemia ,   Anesthesia Other Findings Blood product refusal listed on patient's chart, however patient has received pRBCs this admission. Discussed with patient, he accepts blood products during this admission.  Reproductive/Obstetrics                            Anesthesia Physical Anesthesia Plan  ASA: 3  Anesthesia Plan: General   Post-op Pain Management: Ketamine IV, Lidocaine infusion and Tylenol PO (pre-op)   Induction: Intravenous  PONV Risk Score and Plan: 2 and Midazolam, Dexamethasone and Ondansetron  Airway Management Planned:  Oral ETT  Additional Equipment:   Intra-op Plan:   Post-operative Plan: Extubation in OR  Informed Consent: I have reviewed the patients History and Physical, chart, labs and discussed the procedure including the risks, benefits and alternatives for the proposed anesthesia with the patient or authorized representative who has indicated his/her understanding and acceptance.     Dental advisory given  Plan Discussed with: CRNA  Anesthesia Plan Comments:         Anesthesia Quick Evaluation

## 2021-07-23 NOTE — Progress Notes (Signed)
1 Day Post-Op   Subjective/Chief Complaint: Pt doing well this AM No changes   Objective: Vital signs in last 24 hours: Temp:  [98.2 F (36.8 C)-99.2 F (37.3 C)] 98.2 F (36.8 C) (11/22 0616) Pulse Rate:  [89-104] 89 (11/22 0616) Resp:  [16-18] 16 (11/22 0616) BP: (97-171)/(63-100) 134/71 (11/22 0616) SpO2:  [97 %-100 %] 100 % (11/22 0616) Weight:  [77.1 kg] 77.1 kg (11/21 1209) Last BM Date: 07/22/21  Intake/Output from previous day: 11/21 0701 - 11/22 0700 In: 400 [I.V.:400] Out: -  Intake/Output this shift: No intake/output data recorded.  PE:  Constitutional: No acute distress, conversant, appears states age. Eyes: Anicteric sclerae, moist conjunctiva, no lid lag Lungs: Clear to auscultation bilaterally, normal respiratory effort CV: regular rate and rhythm, no murmurs, no peripheral edema, pedal pulses 2+ GI: Soft, no masses or hepatosplenomegaly, non-tender to palpation Skin: No rashes, palpation reveals normal turgor Psychiatric: appropriate judgment and insight, oriented to person, place, and time   Lab Results:  Recent Labs    07/22/21 0351 07/23/21 0326  WBC  --  9.4  HGB 8.6* 9.8*  HCT 30.1* 34.1*  PLT  --  592*   BMET Recent Labs    07/23/21 0326  NA 136  K 3.5  CL 102  CO2 26  GLUCOSE 98  BUN 9  CREATININE 0.89  CALCIUM 9.4   PT/INR No results for input(s): LABPROT, INR in the last 72 hours. ABG No results for input(s): PHART, HCO3 in the last 72 hours.  Invalid input(s): PCO2, PO2  Studies/Results: No results found.  Anti-infectives: Anti-infectives (From admission, onward)    Start     Dose/Rate Route Frequency Ordered Stop   07/23/21 1030  cefoTEtan (CEFOTAN) 2 g in sodium chloride 0.9 % 100 mL IVPB        2 g 200 mL/hr over 30 Minutes Intravenous On call to O.R. 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

## 2021-07-24 ENCOUNTER — Encounter (HOSPITAL_COMMUNITY): Payer: Self-pay | Admitting: Gastroenterology

## 2021-07-24 DIAGNOSIS — C189 Malignant neoplasm of colon, unspecified: Secondary | ICD-10-CM | POA: Diagnosis present

## 2021-07-24 DIAGNOSIS — C182 Malignant neoplasm of ascending colon: Secondary | ICD-10-CM | POA: Diagnosis present

## 2021-07-24 DIAGNOSIS — D500 Iron deficiency anemia secondary to blood loss (chronic): Secondary | ICD-10-CM | POA: Diagnosis present

## 2021-07-24 LAB — BASIC METABOLIC PANEL
Anion gap: 15 (ref 5–15)
BUN: 11 mg/dL (ref 6–20)
CO2: 22 mmol/L (ref 22–32)
Calcium: 9 mg/dL (ref 8.9–10.3)
Chloride: 99 mmol/L (ref 98–111)
Creatinine, Ser: 0.76 mg/dL (ref 0.61–1.24)
GFR, Estimated: >60 mL/min (ref >60)
Glucose, Bld: 100 mg/dL — ABNORMAL HIGH (ref 70–99)
Potassium: 3.8 mmol/L (ref 3.5–5.1)
Sodium: 136 mmol/L (ref 135–145)

## 2021-07-24 LAB — CBC
HCT: 33 % — ABNORMAL LOW (ref 39.0–52.0)
Hemoglobin: 9.8 g/dL — ABNORMAL LOW (ref 13.0–17.0)
MCH: 20.2 pg — ABNORMAL LOW (ref 26.0–34.0)
MCHC: 29.7 g/dL — ABNORMAL LOW (ref 30.0–36.0)
MCV: 68.2 fL — ABNORMAL LOW (ref 80.0–100.0)
Platelets: 579 10*3/uL — ABNORMAL HIGH (ref 150–400)
RBC: 4.84 MIL/uL (ref 4.22–5.81)
RDW: 25.1 % — ABNORMAL HIGH (ref 11.5–15.5)
WBC: 18.6 10*3/uL — ABNORMAL HIGH (ref 4.0–10.5)
nRBC: 0 % (ref 0.0–0.2)

## 2021-07-24 LAB — MAGNESIUM: Magnesium: 2.2 mg/dL (ref 1.7–2.4)

## 2021-07-24 MED ORDER — DOCUSATE SODIUM 100 MG PO CAPS
100.0000 mg | ORAL_CAPSULE | Freq: Two times a day (BID) | ORAL | Status: DC
  Administered 2021-07-24 – 2021-08-01 (×17): 100 mg via ORAL
  Filled 2021-07-24 (×17): qty 1

## 2021-07-24 MED ORDER — POLYETHYLENE GLYCOL 3350 17 G PO PACK
17.0000 g | PACK | Freq: Every day | ORAL | Status: DC | PRN
  Administered 2021-07-27: 17 g via ORAL
  Filled 2021-07-24 (×2): qty 1

## 2021-07-24 MED ORDER — ACETAMINOPHEN 500 MG PO TABS
1000.0000 mg | ORAL_TABLET | Freq: Three times a day (TID) | ORAL | Status: DC
  Administered 2021-07-24 – 2021-08-01 (×25): 1000 mg via ORAL
  Filled 2021-07-24 (×25): qty 2

## 2021-07-24 MED ORDER — CYCLOBENZAPRINE HCL 10 MG PO TABS
10.0000 mg | ORAL_TABLET | Freq: Two times a day (BID) | ORAL | Status: DC | PRN
Start: 2021-07-24 — End: 2021-07-24

## 2021-07-24 MED ORDER — ENSURE ENLIVE PO LIQD
237.0000 mL | Freq: Two times a day (BID) | ORAL | Status: DC
  Administered 2021-07-24 – 2021-08-01 (×7): 237 mL via ORAL

## 2021-07-24 MED ORDER — CYCLOBENZAPRINE HCL 10 MG PO TABS
10.0000 mg | ORAL_TABLET | Freq: Two times a day (BID) | ORAL | Status: DC
  Administered 2021-07-24 – 2021-07-29 (×11): 10 mg via ORAL
  Filled 2021-07-24 (×15): qty 1

## 2021-07-24 MED ORDER — TAPENTADOL HCL 50 MG PO TABS
75.0000 mg | ORAL_TABLET | Freq: Four times a day (QID) | ORAL | Status: DC
  Administered 2021-07-24 – 2021-08-01 (×30): 75 mg via ORAL
  Filled 2021-07-24 (×30): qty 2

## 2021-07-24 MED ORDER — GABAPENTIN 300 MG PO CAPS
300.0000 mg | ORAL_CAPSULE | Freq: Three times a day (TID) | ORAL | Status: DC
  Administered 2021-07-24 – 2021-08-01 (×25): 300 mg via ORAL
  Filled 2021-07-24 (×25): qty 1

## 2021-07-24 MED ORDER — CARVEDILOL 6.25 MG PO TABS
6.2500 mg | ORAL_TABLET | Freq: Two times a day (BID) | ORAL | Status: DC
  Administered 2021-07-25: 6.25 mg via ORAL
  Filled 2021-07-24: qty 1

## 2021-07-24 MED ORDER — HYDROMORPHONE HCL 1 MG/ML IJ SOLN
1.0000 mg | INTRAMUSCULAR | Status: DC | PRN
  Administered 2021-07-24: 1 mg via INTRAVENOUS
  Administered 2021-07-25: 2 mg via INTRAVENOUS
  Administered 2021-07-25: 1 mg via INTRAVENOUS
  Administered 2021-07-25 – 2021-07-26 (×3): 2 mg via INTRAVENOUS
  Filled 2021-07-24: qty 1
  Filled 2021-07-24: qty 2
  Filled 2021-07-24: qty 1
  Filled 2021-07-24 (×2): qty 2
  Filled 2021-07-24: qty 1
  Filled 2021-07-24: qty 2

## 2021-07-24 MED ORDER — METOPROLOL TARTRATE 5 MG/5ML IV SOLN
5.0000 mg | Freq: Four times a day (QID) | INTRAVENOUS | Status: DC | PRN
  Administered 2021-07-25 – 2021-07-26 (×4): 5 mg via INTRAVENOUS
  Filled 2021-07-24 (×4): qty 5

## 2021-07-24 NOTE — Progress Notes (Signed)
Pt states 1 family member fell on top of him while trying to give him a hug. He said his pain was 10/10 at the time and gradually came down to 8-7/10. Surgical sites clean/dry/intact/, no drainage noted. Advised Pt and family to be cautious next time. Brooke-PA and Dr. [place] made aware. RN will continue to monitor Pt.

## 2021-07-24 NOTE — Plan of Care (Signed)

## 2021-07-24 NOTE — Progress Notes (Signed)
PROGRESS NOTE    Joseph Haney  PFX:[id number] DOB: 27-Jul-1976 DOA: 07/19/2021 PCP: Merryl Hacker, No    Brief Narrative:  Joseph Haney is a 45 year old male with past medical history significant for essential hypertension, GERD, seizure disorder, EtOH abuse who presented to [facility] ED on 11/18 with 1 month history of progressive abdominal pain, nausea/vomiting.  Patient has not followed up with her PCP in greater than 2 years since office closed and has not been taking medications since that timeframe.  He has been attempting to diet which consists of eating only mandrin's for the past 2 months.  Patient also has apparently been vomiting without letting any of his family know.  CT abdomen/pelvis notable for focal area of high-grade narrowing proximal ascending colon.  Gastroenterology consulted.  Hospitalist consulted for further evaluation and management of abdominal pain with intractable nausea and vomiting with high-grade narrowing proximal ascending colon.   Assessment & Plan:   Principal Problem:   Colon adenocarcinoma ([place]) Active Problems:   Alcohol abuse   Seizure disorder ([place])   Colonic mass   Iron deficiency anemia due to chronic blood loss   Invasive colorectal adenocarcinoma Patient presenting to ED with progressive abdominal pain associate with nausea/vomiting.  CT abdomen/pelvis notable for focal area of high-grade narrowing proximal ascending colon.  Gastroenterology was consulted and patient underwent colonoscopy by Dr. Alessandra Bevels on 11/21 strictured distal ascending colon which was biopsied.  Pathology from colonoscopy consistent with invasive colorectal adenocarcinoma, moderately differentiated.  General surgery was consulted and patient underwent robotic assisted laparoscopic right colectomy by Dr. Rosendo Gros on 07/23/2021 --Pathology from colectomy: Pending --CEA: Pending --Advancing to full liquid diet today --Nucynta 75 mg QID --Oxycodone 5-10 mg q4h moderate pain --Dilaudid  1-2 mg IV q3h PRN severe --Oncology arranging outpatient appointment  Essential hypertension BP 159/93 with heart rate 110 this morning. --Start carvedilol 6.25 mg p.o. twice daily --Continue monitor blood pressure and adjust accordingly  Seizure disorder Last seizure reported 2017, currently not on medication outpatient. --Keppra 500 mg twice daily  History of alcohol abuse Patient denies any recent alcohol use.  Microcytic anemia Etiology likely secondary to colon cancer as above.  Iron 11, TIBC 470, ferritin 2.  Transfused 2 unit PRBC 11/20 with repeat hemoglobin 8.6>9.8. --Repeat CBC in a.m.  Leukocytosis WBC 9.4>18.6; likely reactive postoperatively. --Monitor fever curve --Repeat CBC in the a.m.   DVT prophylaxis: SCDs Start: 07/20/21 1716   Code Status: Full Code Family Communication: Updated multiple family members present at bedside this morning  Disposition Plan:  Level of care: Med-Surg Status is: Inpatient  Remains inpatient appropriate because: Needs better pain control, advancement of diet and mobilization     Consultants:  [facility], Dr. Mariana Arn - signed off 11/23 General surgery  Procedures:  Colonoscopy, Dr. Alessandra Bevels 11/21 Right robotic colectomy, Dr. Rosendo Gros 11/22  Antimicrobials:  None   Subjective: Patient seen examined at bedside, resting comfortably.  Family present.  Continues with mild abdominal discomfort.  Passing flatus.  General surgery advancing diet to full liquids today.  No other questions or concerns at this time.  Denies headache, no dizziness, no chest pain, no shortness of breath, no fever/chills/night sweats, no nausea/vomiting/diarrhea, no weakness, no fatigue, no paresthesias.  No acute events overnight per nurse staff.  Objective: Vitals:   07/24/21 0623 07/24/21 0900 07/24/21 1207 07/24/21 1534  BP: (!) 159/93 (!) 157/102 (!) 151/97 (!) 146/89  Pulse: (!) 110 (!) 108 (!) 105 (!) 110  Resp: 20 18 15 17   Temp: 98.6  F (37 C) 99.1 F (37.3 C)  97.9 F (36.6 C)  TempSrc: Oral Oral  Oral  SpO2: 95% 96% 95% 99%  Weight:      Height:        Intake/Output Summary (Last 24 hours) at 07/24/2021 1901 Last data filed at 07/24/2021 1630 Gross per 24 hour  Intake 900 ml  Output 1300 ml  Net -400 ml   Filed Weights   07/19/21 1942 07/22/21 1209  Weight: 77.1 kg 77.1 kg    Examination:  General exam: Appears calm and comfortable  Respiratory system: Clear to auscultation. Respiratory effort normal. Cardiovascular system: S1 & S2 heard, RRR. No JVD, murmurs, rubs, gallops or clicks. No pedal edema. Gastrointestinal system: Abdomen soft, mild distention, mild tenderness to palpation, no rebound/guarding. No organomegaly or masses felt. Normal bowel sounds heard.  Surgical incision noted, clean/dry/intact Central nervous system: Alert and oriented. No focal neurological deficits. Extremities: Symmetric 5 x 5 power. Skin: No rashes, lesions or ulcers Psychiatry: Judgement and insight appear normal. Mood & affect appropriate.     Data Reviewed: I have personally reviewed following labs and imaging studies  CBC: Recent Labs  Lab 07/19/21 1952 07/20/21 0740 07/22/21 0351 07/23/21 0326 07/24/21 0757  WBC 13.2* 8.1  --  9.4 18.6*  NEUTROABS  --  5.5  --   --   --   HGB 7.0* 6.0* 8.6* 9.8* 9.8*  HCT 26.1* 22.2* 30.1* 34.1* 33.0*  MCV 64.0* 63.4*  --  69.2* 68.2*  PLT 621* 504*  --  592* 564*   Basic Metabolic Panel: Recent Labs  Lab 07/19/21 1952 07/20/21 0740 07/23/21 0326 07/24/21 0757  NA 136 137 136 136  K 3.9 4.2 3.5 3.8  CL 101 105 102 99  CO2 25 25 26 22   GLUCOSE 105* 110* 98 100*  BUN 15 9 9 11   CREATININE 0.99 0.82 0.89 0.76  CALCIUM 9.2 8.6* 9.4 9.0  MG  --   --   --  2.2   GFR: Estimated Creatinine Clearance: 114.2 mL/min (by C-G formula based on SCr of 0.76 mg/dL). Liver Function Tests: Recent Labs  Lab 07/19/21 1952 07/20/21 0740  AST 14* 18  ALT 9 8  ALKPHOS  69 63  BILITOT 0.4 0.3  PROT 9.1* 7.8  ALBUMIN 4.3 3.5   Recent Labs  Lab 07/19/21 1952  LIPASE 55*   No results for input(s): AMMONIA in the last 168 hours. Coagulation Profile: No results for input(s): INR, PROTIME in the last 168 hours. Cardiac Enzymes: No results for input(s): CKTOTAL, CKMB, CKMBINDEX, TROPONINI in the last 168 hours. BNP (last 3 results) No results for input(s): PROBNP in the last 8760 hours. HbA1C: No results for input(s): HGBA1C in the last 72 hours. CBG: No results for input(s): GLUCAP in the last 168 hours. Lipid Profile: No results for input(s): CHOL, HDL, LDLCALC, TRIG, CHOLHDL, LDLDIRECT in the last 72 hours. Thyroid Function Tests: No results for input(s): TSH, T4TOTAL, FREET4, T3FREE, THYROIDAB in the last 72 hours. Anemia Panel: No results for input(s): VITAMINB12, FOLATE, FERRITIN, TIBC, IRON, RETICCTPCT in the last 72 hours. Sepsis Labs: No results for input(s): PROCALCITON, LATICACIDVEN in the last 168 hours.  Recent Results (from the past 240 hour(s))  Resp Panel by RT-PCR (Flu A&B, Covid) Nasopharyngeal Swab     Status: None   Collection Time: 07/19/21 10:59 PM   Specimen: Nasopharyngeal Swab; Nasopharyngeal(NP) swabs in vial transport medium  Result Value Ref Range Status   SARS Coronavirus  2 by RT PCR NEGATIVE NEGATIVE Final    Comment: (NOTE) SARS-CoV-2 target nucleic acids are NOT DETECTED.  The SARS-CoV-2 RNA is generally detectable in upper respiratory specimens during the acute phase of infection. The lowest concentration of SARS-CoV-2 viral copies this assay can detect is 138 copies/mL. A negative result does not preclude SARS-Cov-2 infection and should not be used as the sole basis for treatment or other patient management decisions. A negative result may occur with  improper specimen collection/handling, submission of specimen other than nasopharyngeal swab, presence of viral mutation(s) within the areas targeted by this  assay, and inadequate number of viral copies(<138 copies/mL). A negative result must be combined with clinical observations, patient history, and epidemiological information. The expected result is Negative.  Fact Sheet for Patients:  EntrepreneurPulse.com.au  Fact Sheet for Healthcare Providers:  IncredibleEmployment.be  This test is no t yet approved or cleared by the [place] FDA and  has been authorized for detection and/or diagnosis of SARS-CoV-2 by FDA under an Emergency Use Authorization (EUA). This EUA will remain  in effect (meaning this test can be used) for the duration of the COVID-19 declaration under Section 564(b)(1) of the Act, 21 U.S.C.section 360bbb-3(b)(1), unless the authorization is terminated  or revoked sooner.       Influenza A by PCR NEGATIVE NEGATIVE Final   Influenza B by PCR NEGATIVE NEGATIVE Final    Comment: (NOTE) The Xpert Xpress SARS-CoV-2/FLU/RSV plus assay is intended as an aid in the diagnosis of influenza from Nasopharyngeal swab specimens and should not be used as a sole basis for treatment. Nasal washings and aspirates are unacceptable for Xpert Xpress SARS-CoV-2/FLU/RSV testing.  Fact Sheet for Patients: EntrepreneurPulse.com.au  Fact Sheet for Healthcare Providers: IncredibleEmployment.be  This test is not yet approved or cleared by the [place] FDA and has been authorized for detection and/or diagnosis of SARS-CoV-2 by FDA under an Emergency Use Authorization (EUA). This EUA will remain in effect (meaning this test can be used) for the duration of the COVID-19 declaration under Section 564(b)(1) of the Act, 21 U.S.C. section 360bbb-3(b)(1), unless the authorization is terminated or revoked.  Performed at [facility], [address] [place], [place] [address]   Surgical pcr screen     Status: None   Collection Time: 07/23/21  6:07 AM    Specimen: Nasal Mucosa; Nasal Swab  Result Value Ref Range Status   MRSA, PCR NEGATIVE NEGATIVE Final   Staphylococcus aureus NEGATIVE NEGATIVE Final    Comment: (NOTE) The Xpert SA Assay (FDA approved for NASAL specimens in patients 31 years of age and older), is one component of a comprehensive surveillance program. It is not intended to diagnose infection nor to guide or monitor treatment. Performed at [facility], [place] [address], [place], [place] [address]          Radiology Studies: No results found.      Scheduled Meds:  acetaminophen  1,000 mg Oral TID   [START ON 07/25/2021] carvedilol  6.25 mg Oral BID WC   cyclobenzaprine  10 mg Oral BID   docusate sodium  100 mg Oral BID   feeding supplement  237 mL Oral BID BM   gabapentin  300 mg Oral TID   tapentadol HCl  75 mg Oral QID   Continuous Infusions:   LOS: 4 days    Time spent: 41 minutes spent on chart review, discussion with nursing staff, consultants, updating family and interview/physical exam; more than  50% of that time was spent in counseling and/or coordination of care.    Camdynn Maranto J [place] DO [facility] Available via Epic secure chat 7am-7pm After these hours, please refer to coverage provider listed on [email or web address] 07/24/2021, 7:01 PM

## 2021-07-24 NOTE — Plan of Care (Signed)
Plan of care reviewed and discussed with the patient.  Discussed the importance of ambulation and OOB.

## 2021-07-24 NOTE — Progress Notes (Signed)
Patient ID: Joseph Haney, male   DOB: 04-03-1976, 45 y.o.   MRN: [id number] [facility] Surgery Progress Note  1 Day Post-Op  Subjective: CC-  Having a lot of abdominal pain. Requiring dilaudid and oxy, which he states helps. States that he takes nucynta and oxy at home. Tolerating clear liquids. Denies n/v. Passing flatus, no BM.  Objective: Vital signs in last 24 hours: Temp:  [97.9 F (36.6 C)-99.1 F (37.3 C)] 99.1 F (37.3 C) (11/23 0900) Pulse Rate:  [92-110] 108 (11/23 0900) Resp:  [11-20] 18 (11/23 0900) BP: (148-176)/(90-105) 157/102 (11/23 0900) SpO2:  [95 %-100 %] 96 % (11/23 0900) Last BM Date: 07/22/21  Intake/Output from previous day: 11/22 0701 - 11/23 0700 In: 9924 [P.O.:300; I.V.:1000; IV Piggyback:150] Out: 770 [Urine:750; Blood:20] Intake/Output this shift: No intake/output data recorded.  PE: Gen:  Alert, NAD, pleasant Card:  tachycardic 110s Pulm:  rate and effort normal Abd: Soft, mild distension, mild diffuse TTP without rebound or guarding, +BS, incisions cdi  Lab Results:  Recent Labs    07/23/21 0326 07/24/21 0757  WBC 9.4 18.6*  HGB 9.8* 9.8*  HCT 34.1* 33.0*  PLT 592* 579*   BMET Recent Labs    07/23/21 0326 07/24/21 0757  NA 136 136  K 3.5 3.8  CL 102 99  CO2 26 22  GLUCOSE 98 100*  BUN 9 11  CREATININE 0.89 0.76  CALCIUM 9.4 9.0   PT/INR No results for input(s): LABPROT, INR in the last 72 hours. CMP     Component Value Date/Time   NA 136 07/24/2021 0757   K 3.8 07/24/2021 0757   CL 99 07/24/2021 0757   CO2 22 07/24/2021 0757   GLUCOSE 100 (H) 07/24/2021 0757   BUN 11 07/24/2021 0757   CREATININE 0.76 07/24/2021 0757   CREATININE 0.62 10/21/2012 1530   CALCIUM 9.0 07/24/2021 0757   PROT 7.8 07/20/2021 0740   ALBUMIN 3.5 07/20/2021 0740   AST 18 07/20/2021 0740   ALT 8 07/20/2021 0740   ALKPHOS 63 07/20/2021 0740   BILITOT 0.3 07/20/2021 0740   GFRNONAA >60 07/24/2021 0757   GFRNONAA >89 10/21/2012 1530    GFRAA >60 11/28/2015 0501   GFRAA >89 10/21/2012 1530   Lipase     Component Value Date/Time   LIPASE 55 (H) 07/19/2021 1952       Studies/Results: No results found.  Anti-infectives: Anti-infectives (From admission, onward)    Start     Dose/Rate Route Frequency Ordered Stop   07/23/21 1030  cefoTEtan (CEFOTAN) 2 g in sodium chloride 0.9 % 100 mL IVPB        2 g 200 mL/hr over 30 Minutes Intravenous On call to O.R. 07/22/21 1628 07/23/21 1121        Assessment/Plan Right colon mass POD#1 S/p XI ROBOT ASSISTED LAPAROSCOPIC RIGHT COLECTOMY  11/22 Dr. Rosendo Gros - surgical path pending, bx from flex sig Invasive colorectal adenocarcinoma. Primary team contacting oncology for outpatient appointment - CEA pending - Reorder home pain medications (Nucynta, flexeril, gabapentin). Schedule tylenol - Mobilize, PT consult pending - Advance to full liquids  ID - cefotean periop FEN - FLD VTE - SCDs, ok for chemical dvt prophylaxis from surgical standpoint Foley - none  HTN GERD Seizure d/o - last seizure 2017, not on medication Remote h/o alcohol abuse - 2017 Microcytic anemia Chronic pain    LOS: 4 days    Wellington Hampshire, [facility] Surgery 07/24/2021, 9:19 AM Please see Amion for  pager number during day hours 7:00am-4:30pm

## 2021-07-24 NOTE — Progress Notes (Addendum)
[facility] Gastroenterology Progress Note  Joseph Haney 44 y.o. [birth year]  CC: Colon cancer   Subjective: Patient seen and examined at bedside.  Underwent right hemicolectomy yesterday.  Complaining of abdominal pain.  ROS : Afebrile.  Negative for chest pain.  Positive for weakness   Objective: Vital signs in last 24 hours: Vitals:   07/24/21 0323 07/24/21 0623  BP: (!) 152/99 (!) 159/93  Pulse: (!) 110 (!) 110  Resp: 18 20  Temp: 98.3 F (36.8 C) 98.6 F (37 C)  SpO2: 95% 95%    Physical Exam:  General:  Alert, cooperative, no distress, appears stated age  Head:  Normocephalic, without obvious abnormality, atraumatic  Eyes:  , EOM's intact,   Lungs:   Clear to auscultation bilaterally, respirations unlabored  Heart:  Regular rate and rhythm, S1, S2 normal  Abdomen:   Soft, generalized abdominal discomfort on palpation, bowel sounds hypoactive, no peritoneal signs  Extremities: Extremities normal, atraumatic, no  edema  Pulses: 2+ and symmetric    Lab Results: Recent Labs    07/23/21 0326 07/24/21 0757  NA 136 136  K 3.5 3.8  CL 102 99  CO2 26 22  GLUCOSE 98 100*  BUN 9 11  CREATININE 0.89 0.76  CALCIUM 9.4 9.0  MG  --  2.2   No results for input(s): AST, ALT, ALKPHOS, BILITOT, PROT, ALBUMIN in the last 72 hours. Recent Labs    07/23/21 0326 07/24/21 0757  WBC 9.4 18.6*  HGB 9.8* 9.8*  HCT 34.1* 33.0*  MCV 69.2* 68.2*  PLT 592* 579*   No results for input(s): LABPROT, INR in the last 72 hours.    Assessment/Plan: -Adenocarcinoma of the colon involving distal ascending colon.  Status post right hemicolectomy 11/22.  Normal LFTs. CEA pending  -Anemia.  Secondary from above.  Recommendation ---------------------- -Follow surgical pathology.  Biopsies from colonoscopy positive for adenocarcinoma -Need for early colonoscopy in the first-degree relatives discussed with the patient.  They need to start colonoscopy at or  before age 49 -Need for  repeat surveillance colonoscopy in 6 to 12 months and high risk of recurrence during the first 3 years also discussed. - D/W Dr. Alvy Bimler from oncology.  They will assign patient to oncology navigator. -Further management per surgical team.  GI will sign off.  Call us back if needed   [name] MD, [place] 07/24/2021, 9:00 AM  Contact #  [phone]

## 2021-07-24 NOTE — Evaluation (Signed)
Physical Therapy Evaluation Patient Details Name: Joseph Haney MRN: [id number] DOB: Mar 07, 1976 Today's Date: 07/24/2021  History of Present Illness  45 yo male admitted with colonic mass s/p R colectomy 11/22. Hx of Sz, distance ETOH use/abuse, anemia  Clinical Impression  On eval, pt required Min A for mobility. He walked ~120 feet while pushing IV pole. Moderate pain at rest and with activity. Assisted pt back into bed, at his request. Pt reported an incident in which he stated one of his visitors went to give him a hug and she lost her balance and fell onto him in the bed-made RN aware. Will plan to follow and progress activity as tolerated.  Encouraged pt to spend some time sitting up in the recliner and to mobilize more.      Recommendations for follow up therapy are one component of a multi-disciplinary discharge planning process, led by the attending physician.  Recommendations may be updated based on patient status, additional functional criteria and insurance authorization.  Follow Up Recommendations No PT follow up    Assistance Recommended at Discharge Intermittent Supervision/Assistance  Functional Status Assessment Patient has had a recent decline in their functional status and demonstrates the ability to make significant improvements in function in a reasonable and predictable amount of time.  Equipment Recommendations   (may need a walker-continuing to assess)    Recommendations for Other Services       Precautions / Restrictions Precautions Precautions: Fall Precaution Comments: abd sg Restrictions Weight Bearing Restrictions: No      Mobility  Bed Mobility Overal bed mobility: Needs Assistance             General bed mobility comments: HOB elevated. Semi roll onto R side. Supervision assist    Transfers Overall transfer level: Needs assistance   Transfers: Sit to/from Stand Sit to Stand: Min assist;From elevated surface           General  transfer comment: Small amount of assist to rise, steady.    Ambulation/Gait Ambulation/Gait assistance: Min assist Gait Distance (Feet): 120 Feet Assistive device: IV Pole;Rolling walker (2 wheels) Gait Pattern/deviations: Step-through pattern;Decreased stride length       General Gait Details: Min A to help manage IV pole intermittently. Slow but fairly steady gait. Somewhat flexed posture 2* abd pain. Pt tolerated distance fairly well.  Stairs            Wheelchair Mobility    Modified Rankin (Stroke Patients Only)       Balance Overall balance assessment: Needs assistance         Standing balance support: Single extremity supported Standing balance-Leahy Scale: Fair                               Pertinent Vitals/Pain Pain Assessment: 0-10 Pain Score: 8  Pain Location: abdomen Pain Descriptors / Indicators: Discomfort;Sore Pain Intervention(s): Limited activity within patient's tolerance;Monitored during session;Repositioned    Home Living Family/patient expects to be discharged to:: Private residence Living Arrangements: Parent Available Help at Discharge: Family Type of Home: House         Home Layout: One level Home Equipment: None      Prior Function Prior Level of Function : Independent/Modified Independent                     Hand Dominance        Extremity/Trunk Assessment   Upper Extremity Assessment Upper Extremity  Assessment: Overall WFL for tasks assessed    Lower Extremity Assessment Lower Extremity Assessment: Generalized weakness    Cervical / Trunk Assessment Cervical / Trunk Assessment: Normal  Communication   Communication: No difficulties  Cognition Arousal/Alertness: Awake/alert Behavior During Therapy: WFL for tasks assessed/performed Overall Cognitive Status: Within Functional Limits for tasks assessed                                          General Comments       Exercises     Assessment/Plan    PT Assessment Patient needs continued PT services  PT Problem List Decreased strength;Decreased mobility;Decreased activity tolerance;Decreased balance;Decreased knowledge of use of DME;Pain       PT Treatment Interventions DME instruction;Therapeutic exercise;Therapeutic activities;Gait training;Functional mobility training;Balance training    PT Goals (Current goals can be found in the Care Plan section)  Acute Rehab PT Goals Patient Stated Goal: less pain PT Goal Formulation: With patient Time For Goal Achievement: 08/07/21 Potential to Achieve Goals: Good    Frequency Min 3X/week   Barriers to discharge        Co-evaluation               AM-PAC PT "6 Clicks" Mobility  Outcome Measure Help needed turning from your back to your side while in a flat bed without using bedrails?: A Little Help needed moving from lying on your back to sitting on the side of a flat bed without using bedrails?: A Little Help needed moving to and from a bed to a chair (including a wheelchair)?: A Little Help needed standing up from a chair using your arms (e.g., wheelchair or bedside chair)?: A Little Help needed to walk in hospital room?: A Little Help needed climbing 3-5 steps with a railing? : A Little 6 Click Score: 18    End of Session   Activity Tolerance: Patient limited by pain Patient left: in bed;with call bell/phone within reach;with family/visitor present   PT Visit Diagnosis: Pain;Difficulty in walking, not elsewhere classified (R26.2)    Time: 9622-2979 PT Time Calculation (min) (ACUTE ONLY): 18 min   Charges:   PT Evaluation $PT Eval Moderate Complexity: 1 Mod            Doreatha Massed, PT Acute Rehabilitation  Office: [phone] Pager: [phone]

## 2021-07-25 DIAGNOSIS — C189 Malignant neoplasm of colon, unspecified: Secondary | ICD-10-CM | POA: Diagnosis not present

## 2021-07-25 LAB — CBC
HCT: 31.3 % — ABNORMAL LOW (ref 39.0–52.0)
Hemoglobin: 9.1 g/dL — ABNORMAL LOW (ref 13.0–17.0)
MCH: 20.3 pg — ABNORMAL LOW (ref 26.0–34.0)
MCHC: 29.1 g/dL — ABNORMAL LOW (ref 30.0–36.0)
MCV: 69.9 fL — ABNORMAL LOW (ref 80.0–100.0)
Platelets: 622 10*3/uL — ABNORMAL HIGH (ref 150–400)
RBC: 4.48 MIL/uL (ref 4.22–5.81)
RDW: 25.4 % — ABNORMAL HIGH (ref 11.5–15.5)
WBC: 18.4 10*3/uL — ABNORMAL HIGH (ref 4.0–10.5)
nRBC: 0 % (ref 0.0–0.2)

## 2021-07-25 MED ORDER — ALUM & MAG HYDROXIDE-SIMETH 200-200-20 MG/5ML PO SUSP
30.0000 mL | ORAL | Status: DC | PRN
  Administered 2021-07-25 – 2021-07-28 (×2): 30 mL via ORAL
  Filled 2021-07-25 (×3): qty 30

## 2021-07-25 MED ORDER — CARVEDILOL 12.5 MG PO TABS
12.5000 mg | ORAL_TABLET | Freq: Two times a day (BID) | ORAL | Status: DC
  Administered 2021-07-25: 12.5 mg via ORAL
  Filled 2021-07-25: qty 1

## 2021-07-25 NOTE — Plan of Care (Signed)
  Problem: Activity: Goal: Risk for activity intolerance will decrease Outcome: Progressing   Problem: Pain Managment: Goal: General experience of comfort will improve Outcome: Progressing   Problem: Safety: Goal: Ability to remain free from injury will improve Outcome: Progressing   

## 2021-07-25 NOTE — Progress Notes (Signed)
2 Days Post-Op   Subjective/Chief Complaint: Passing some flatus Still requiring IV Dilaudid on top of the PO oxycodone, but there was an IV issues yesterday. Tolerating liquids without nausea   Objective: Vital signs in last 24 hours: Temp:  [97.9 F (36.6 C)-99.4 F (37.4 C)] 98.7 F (37.1 C) (11/24 0601) Pulse Rate:  [105-110] 110 (11/24 0601) Resp:  [15-18] 16 (11/24 0601) BP: (142-157)/(89-103) 142/100 (11/24 0601) SpO2:  [95 %-99 %] 97 % (11/24 0601) Last BM Date: 07/22/21  Intake/Output from previous day: 11/23 0701 - 11/24 0700 In: 840 [P.O.:840] Out: 1250 [Urine:1250] Intake/Output this shift: No intake/output data recorded.  Gen:  Alert, NAD, pleasant Card:  tachycardic 110s Pulm:  rate and effort normal Abd: Soft, mild distension, mild diffuse TTP without rebound or guarding, +BS, incisions cdi    Lab Results:  Recent Labs    07/24/21 0757 07/25/21 0337  WBC 18.6* 18.4*  HGB 9.8* 9.1*  HCT 33.0* 31.3*  PLT 579* 622*   BMET Recent Labs    07/23/21 0326 07/24/21 0757  NA 136 136  K 3.5 3.8  CL 102 99  CO2 26 22  GLUCOSE 98 100*  BUN 9 11  CREATININE 0.89 0.76  CALCIUM 9.4 9.0   PT/INR No results for input(s): LABPROT, INR in the last 72 hours. ABG No results for input(s): PHART, HCO3 in the last 72 hours.  Invalid input(s): PCO2, PO2  Studies/Results: No results found.  Anti-infectives: Anti-infectives (From admission, onward)    Start     Dose/Rate Route Frequency Ordered Stop   07/23/21 1030  cefoTEtan (CEFOTAN) 2 g in sodium chloride 0.9 % 100 mL IVPB        2 g 200 mL/hr over 30 Minutes Intravenous On call to O.R. 07/22/21 1628 07/23/21 1121       Assessment/Plan: Right colon mass POD#2 S/p XI ROBOT ASSISTED LAPAROSCOPIC RIGHT COLECTOMY  11/22 Dr. Rosendo Gros - surgical path pending, bx from flex sig Invasive colorectal adenocarcinoma. Primary team contacting oncology for outpatient appointment - CEA pending - Reorder home  pain medications (Nucynta, flexeril, gabapentin). Schedule tylenol - Mobilize, encourage ambulation; PT consult pending - Advance to full liquids   ID - cefotean periop FEN - FLD VTE - SCDs, ok for chemical dvt prophylaxis from surgical standpoint Foley - none   HTN GERD Seizure d/o - last seizure 2017, not on medication Remote h/o alcohol abuse - 2017 Microcytic anemia Chronic pain     LOS: 5 days    Joseph Haney 07/25/2021

## 2021-07-25 NOTE — Progress Notes (Signed)
Notified Dr Georgette Dover that patients says his pain is not controlled on current pain medications.  Dr Georgette Dover said to continue to offer po oxy and then dilaudid for breakthrough.  No new orders.

## 2021-07-25 NOTE — Progress Notes (Signed)
PROGRESS NOTE    Geroge Haney  WGN:[id number] DOB: 07/30/1976 DOA: 07/19/2021 PCP: Merryl Hacker, No    Brief Narrative:  Joseph Haney is a 45 year old male with past medical history significant for essential hypertension, GERD, seizure disorder, EtOH abuse who presented to [facility] ED on 11/18 with 1 month history of progressive abdominal pain, nausea/vomiting.  Patient has not followed up with her PCP in greater than 2 years since office closed and has not been taking medications since that timeframe.  He has been attempting to diet which consists of eating only mandrin's for the past 2 months.  Patient also has apparently been vomiting without letting any of his family know.  CT abdomen/pelvis notable for focal area of high-grade narrowing proximal ascending colon.  Gastroenterology consulted.  Hospitalist consulted for further evaluation and management of abdominal pain with intractable nausea and vomiting with high-grade narrowing proximal ascending colon.   Assessment & Plan:   Principal Problem:   Colon adenocarcinoma ([place]) Active Problems:   Alcohol abuse   Seizure disorder ([place])   Colonic mass   Iron deficiency anemia due to chronic blood loss   Invasive colorectal adenocarcinoma Patient presenting to ED with progressive abdominal pain associate with nausea/vomiting.  CT abdomen/pelvis notable for focal area of high-grade narrowing proximal ascending colon.  Gastroenterology was consulted and patient underwent colonoscopy by Dr. Alessandra Bevels on 11/21 strictured distal ascending colon which was biopsied.  Pathology from colonoscopy consistent with invasive colorectal adenocarcinoma, moderately differentiated.  General surgery was consulted and patient underwent robotic assisted laparoscopic right colectomy by Dr. Rosendo Gros on 07/23/2021 --Pathology from colectomy: Pending --CEA: Pending --Full liquid diet --Nucynta 75 mg QID --Oxycodone 5-10 mg q4h moderate pain --Dilaudid 1-2 mg IV q3h PRN  severe --Deferring pain control to general surgery as they are making adjustments --Oncology arranging outpatient appointment  Essential hypertension --Increase carvedilol 6.25 mg p.o. twice daily --Metoprolol 5mg  IV q6h PRN SBP >180 or DBP >90 and/or sustained HR >120 --Continue monitor blood pressure and adjust accordingly  Seizure disorder Last seizure reported 2017, currently not on medication outpatient. --Keppra 500 mg twice daily  History of alcohol abuse Patient denies any recent alcohol use.  Microcytic anemia Etiology likely secondary to colon cancer as above.  Iron 11, TIBC 470, ferritin 2.  Transfused 2 unit PRBC 11/20 with repeat hemoglobin 8.6>9.8>9.1, stable.  Leukocytosis WBC 9.4>18.6>18.4; likely reactive postoperatively. --Monitor fever curve   DVT prophylaxis: SCDs Start: 07/20/21 1716   Code Status: Full Code Family Communication: Updated multiple family member present at bedside this morning  Disposition Plan:  Level of care: Med-Surg Status is: Inpatient  Remains inpatient appropriate because: Needs better pain control, advancement of diet and mobilization     Consultants:  [facility], Dr. Mariana Arn - signed off 11/23 General surgery  Procedures:  Colonoscopy, Dr. Alessandra Bevels 11/21 Right robotic colectomy, Dr. Rosendo Gros 11/22  Antimicrobials:  None   Subjective: Patient seen examined at bedside, resting comfortably.  Family present.  Continues with mild abdominal discomfort.  Passing flatus.  Continues to not to mobilize due to pain.  No other questions or concerns at this time.  Denies headache, no dizziness, no chest pain, no shortness of breath, no fever/chills/night sweats, no nausea/vomiting/diarrhea, no weakness, no fatigue, no paresthesias.  Nursing reports that family member fell on patient while trying to give a "hug" yesterday with resultant abdominal pain, otherwise no acute events overnight per nurse staff.  Objective: Vitals:    07/25/21 0246 07/25/21 0601 07/25/21 1120 07/25/21 1204  BP: [name])  142/100 (!) 142/100 (!) 144/101 (!) 154/106  Pulse: (!) 109 (!) 110 (!) 123 (!) 114  Resp: 16 16 18 18   Temp:  98.7 F (37.1 C) 99.6 F (37.6 C) 98.9 F (37.2 C)  TempSrc:  Oral Oral Oral  SpO2: 97% 97% 97% 97%  Weight:      Height:        Intake/Output Summary (Last 24 hours) at 07/25/2021 1209 Last data filed at 07/25/2021 0600 Gross per 24 hour  Intake 600 ml  Output 1000 ml  Net -400 ml   Filed Weights   07/19/21 1942 07/22/21 1209  Weight: 77.1 kg 77.1 kg    Examination:  General exam: Appears calm and comfortable  Respiratory system: Clear to auscultation. Respiratory effort normal. Cardiovascular system: S1 & S2 heard, RRR. No JVD, murmurs, rubs, gallops or clicks. No pedal edema. Gastrointestinal system: Abdomen soft, mild distention, mild tenderness to palpation, no rebound/guarding. No organomegaly or masses felt. Normal bowel sounds heard.  Surgical incision noted, clean/dry/intact Central nervous system: Alert and oriented. No focal neurological deficits. Extremities: Symmetric 5 x 5 power. Skin: No rashes, lesions or ulcers Psychiatry: Judgement and insight appear normal. Mood & affect appropriate.     Data Reviewed: I have personally reviewed following labs and imaging studies  CBC: Recent Labs  Lab 07/19/21 1952 07/20/21 0740 07/22/21 0351 07/23/21 0326 07/24/21 0757 07/25/21 0337  WBC 13.2* 8.1  --  9.4 18.6* 18.4*  NEUTROABS  --  5.5  --   --   --   --   HGB 7.0* 6.0* 8.6* 9.8* 9.8* 9.1*  HCT 26.1* 22.2* 30.1* 34.1* 33.0* 31.3*  MCV 64.0* 63.4*  --  69.2* 68.2* 69.9*  PLT 621* 504*  --  592* 579* 361*   Basic Metabolic Panel: Recent Labs  Lab 07/19/21 1952 07/20/21 0740 07/23/21 0326 07/24/21 0757  NA 136 137 136 136  K 3.9 4.2 3.5 3.8  CL 101 105 102 99  CO2 25 25 26 22   GLUCOSE 105* 110* 98 100*  BUN 15 9 9 11   CREATININE 0.99 0.82 0.89 0.76  CALCIUM 9.2 8.6* 9.4  9.0  MG  --   --   --  2.2   GFR: Estimated Creatinine Clearance: 114.2 mL/min (by C-G formula based on SCr of 0.76 mg/dL). Liver Function Tests: Recent Labs  Lab 07/19/21 1952 07/20/21 0740  AST 14* 18  ALT 9 8  ALKPHOS 69 63  BILITOT 0.4 0.3  PROT 9.1* 7.8  ALBUMIN 4.3 3.5   Recent Labs  Lab 07/19/21 1952  LIPASE 55*   No results for input(s): AMMONIA in the last 168 hours. Coagulation Profile: No results for input(s): INR, PROTIME in the last 168 hours. Cardiac Enzymes: No results for input(s): CKTOTAL, CKMB, CKMBINDEX, TROPONINI in the last 168 hours. BNP (last 3 results) No results for input(s): PROBNP in the last 8760 hours. HbA1C: No results for input(s): HGBA1C in the last 72 hours. CBG: No results for input(s): GLUCAP in the last 168 hours. Lipid Profile: No results for input(s): CHOL, HDL, LDLCALC, TRIG, CHOLHDL, LDLDIRECT in the last 72 hours. Thyroid Function Tests: No results for input(s): TSH, T4TOTAL, FREET4, T3FREE, THYROIDAB in the last 72 hours. Anemia Panel: No results for input(s): VITAMINB12, FOLATE, FERRITIN, TIBC, IRON, RETICCTPCT in the last 72 hours. Sepsis Labs: No results for input(s): PROCALCITON, LATICACIDVEN in the last 168 hours.  Recent Results (from the past 240 hour(s))  Resp Panel by RT-PCR (Flu A&B, Covid)  Nasopharyngeal Swab     Status: None   Collection Time: 07/19/21 10:59 PM   Specimen: Nasopharyngeal Swab; Nasopharyngeal(NP) swabs in vial transport medium  Result Value Ref Range Status   SARS Coronavirus 2 by RT PCR NEGATIVE NEGATIVE Final    Comment: (NOTE) SARS-CoV-2 target nucleic acids are NOT DETECTED.  The SARS-CoV-2 RNA is generally detectable in upper respiratory specimens during the acute phase of infection. The lowest concentration of SARS-CoV-2 viral copies this assay can detect is 138 copies/mL. A negative result does not preclude SARS-Cov-2 infection and should not be used as the sole basis for treatment  or other patient management decisions. A negative result may occur with  improper specimen collection/handling, submission of specimen other than nasopharyngeal swab, presence of viral mutation(s) within the areas targeted by this assay, and inadequate number of viral copies(<138 copies/mL). A negative result must be combined with clinical observations, patient history, and epidemiological information. The expected result is Negative.  Fact Sheet for Patients:  EntrepreneurPulse.com.au  Fact Sheet for Healthcare Providers:  IncredibleEmployment.be  This test is no t yet approved or cleared by the [place] FDA and  has been authorized for detection and/or diagnosis of SARS-CoV-2 by FDA under an Emergency Use Authorization (EUA). This EUA will remain  in effect (meaning this test can be used) for the duration of the COVID-19 declaration under Section 564(b)(1) of the Act, 21 U.S.C.section 360bbb-3(b)(1), unless the authorization is terminated  or revoked sooner.       Influenza A by PCR NEGATIVE NEGATIVE Final   Influenza B by PCR NEGATIVE NEGATIVE Final    Comment: (NOTE) The Xpert Xpress SARS-CoV-2/FLU/RSV plus assay is intended as an aid in the diagnosis of influenza from Nasopharyngeal swab specimens and should not be used as a sole basis for treatment. Nasal washings and aspirates are unacceptable for Xpert Xpress SARS-CoV-2/FLU/RSV testing.  Fact Sheet for Patients: EntrepreneurPulse.com.au  Fact Sheet for Healthcare Providers: IncredibleEmployment.be  This test is not yet approved or cleared by the [place] FDA and has been authorized for detection and/or diagnosis of SARS-CoV-2 by FDA under an Emergency Use Authorization (EUA). This EUA will remain in effect (meaning this test can be used) for the duration of the COVID-19 declaration under Section 564(b)(1) of the Act, 21 U.S.C. section  360bbb-3(b)(1), unless the authorization is terminated or revoked.  Performed at [facility], [address], [place], [place] [address]   Surgical pcr screen     Status: None   Collection Time: 07/23/21  6:07 AM   Specimen: Nasal Mucosa; Nasal Swab  Result Value Ref Range Status   MRSA, PCR NEGATIVE NEGATIVE Final   Staphylococcus aureus NEGATIVE NEGATIVE Final    Comment: (NOTE) The Xpert SA Assay (FDA approved for NASAL specimens in patients 66 years of age and older), is one component of a comprehensive surveillance program. It is not intended to diagnose infection nor to guide or monitor treatment. Performed at [facility], [place] [address] [place], [place] [address]          Radiology Studies: No results found.      Scheduled Meds:  acetaminophen  1,000 mg Oral TID   carvedilol  6.25 mg Oral BID WC   cyclobenzaprine  10 mg Oral BID   docusate sodium  100 mg Oral BID   feeding supplement  237 mL Oral BID BM   gabapentin  300 mg Oral TID   tapentadol HCl  75 mg Oral  QID   Continuous Infusions:   LOS: 5 days    Time spent: 41 minutes spent on chart review, discussion with nursing staff, consultants, updating family and interview/physical exam; more than 50% of that time was spent in counseling and/or coordination of care.    Cambell Rickenbach J [place] DO [facility] Available via Epic secure chat 7am-7pm After these hours, please refer to coverage provider listed on [email or web address] 07/25/2021, 12:09 PM

## 2021-07-26 DIAGNOSIS — C189 Malignant neoplasm of colon, unspecified: Secondary | ICD-10-CM | POA: Diagnosis not present

## 2021-07-26 LAB — CEA: CEA: 2.9 ng/mL (ref 0.0–4.7)

## 2021-07-26 MED ORDER — HYDROMORPHONE HCL 1 MG/ML IJ SOLN
1.0000 mg | INTRAMUSCULAR | Status: DC | PRN
  Administered 2021-07-26 – 2021-07-28 (×6): 1 mg via INTRAVENOUS
  Filled 2021-07-26 (×6): qty 1

## 2021-07-26 MED ORDER — CARVEDILOL 25 MG PO TABS
25.0000 mg | ORAL_TABLET | Freq: Two times a day (BID) | ORAL | Status: DC
  Administered 2021-07-26 – 2021-08-01 (×13): 25 mg via ORAL
  Filled 2021-07-26 (×13): qty 1

## 2021-07-26 MED ORDER — OXYCODONE HCL 5 MG PO TABS
15.0000 mg | ORAL_TABLET | ORAL | Status: DC | PRN
  Administered 2021-07-26 – 2021-08-01 (×29): 15 mg via ORAL
  Filled 2021-07-26 (×29): qty 3

## 2021-07-26 NOTE — Progress Notes (Signed)
Physical Therapy Discharge Patient Details Name: Joseph Haney MRN: [id number] DOB: 1976/08/28 Today's Date: 07/26/2021 Time:  -     Patient discharged from PT services secondary to goals met and no further PT needs identified.  Please see latest therapy progress note for current level of functioning and progress toward goals.    Progress and discharge plan discussed with patient and/or caregiver: Patient/Caregiver agrees with plan  Pt and family report pt has been mobilizing.  Ambulated around unit four times yesterday.  Pt and family decline further PT needs at this time and agreeable for PT to sign off.     [name] 07/26/2021, 10:23 AM  Arlyce Dice, DPT Acute Rehabilitation Services Pager: [phone] Office: [phone]

## 2021-07-26 NOTE — Progress Notes (Signed)
PROGRESS NOTE    Joseph Haney  LYY:[id number] DOB: 1976-01-12 DOA: 07/19/2021 PCP: Merryl Hacker, No    Brief Narrative:  Joseph Haney is a 45 year old male with past medical history significant for essential hypertension, GERD, seizure disorder, EtOH abuse who presented to [facility] ED on 11/18 with 1 month history of progressive abdominal pain, nausea/vomiting.  Patient has not followed up with her PCP in greater than 2 years since office closed and has not been taking medications since that timeframe.  He has been attempting to diet which consists of eating only mandrin's for the past 2 months.  Patient also has apparently been vomiting without letting any of his family know.  CT abdomen/pelvis notable for focal area of high-grade narrowing proximal ascending colon.  Gastroenterology consulted.  Hospitalist consulted for further evaluation and management of abdominal pain with intractable nausea and vomiting with high-grade narrowing proximal ascending colon.   Assessment & Plan:   Principal Problem:   Colon adenocarcinoma ([place]) Active Problems:   Alcohol abuse   Seizure disorder ([place])   Colonic mass   Iron deficiency anemia due to chronic blood loss   Invasive colorectal adenocarcinoma Patient presenting to ED with progressive abdominal pain associate with nausea/vomiting.  CT abdomen/pelvis notable for focal area of high-grade narrowing proximal ascending colon.  Gastroenterology was consulted and patient underwent colonoscopy by Dr. Alessandra Bevels on 11/21 strictured distal ascending colon which was biopsied.  Pathology from colonoscopy consistent with invasive colorectal adenocarcinoma, moderately differentiated.  General surgery was consulted and patient underwent robotic assisted laparoscopic right colectomy by Dr. Rosendo Gros on 07/23/2021 --Pathology from colectomy: Pending --CEA: Pending --Full liquid diet --Nucynta 75 mg QID --Oxycodone 15 mg q4h moderate pain --Dilaudid 1-2 mg IV q3h PRN  severe --Oncology arranging outpatient appointment  Essential hypertension --Increase carvedilol to 25 mg p.o. twice daily --Metoprolol 5mg  IV q6h PRN SBP >180 or DBP >90 and/or sustained HR >120 --Continue monitor blood pressure and adjust accordingly  Seizure disorder Last seizure reported 2017, currently not on medication outpatient. --Keppra 500 mg twice daily  History of alcohol abuse Patient denies any recent alcohol use.  Microcytic anemia Etiology likely secondary to colon cancer as above.  Iron 11, TIBC 470, ferritin 2.  Transfused 2 unit PRBC 11/20 with repeat hemoglobin 8.6>9.8>9.1, stable.  Leukocytosis WBC 9.4>18.6>18.4; likely reactive postoperatively. --Monitor fever curve   DVT prophylaxis: SCDs Start: 07/20/21 1716   Code Status: Full Code Family Communication: Updated family present at bedside this morning  Disposition Plan:  Level of care: Med-Surg Status is: Inpatient  Remains inpatient appropriate because: Needs better pain control, advancement of diet and mobilization     Consultants:  [facility], Dr. Mariana Arn - signed off 11/23 General surgery  Procedures:  Colonoscopy, Dr. Alessandra Bevels 11/21 Right robotic colectomy, Dr. Rosendo Gros 11/22  Antimicrobials:  None   Subjective: Patient seen examined at bedside, resting comfortably.  Family present.  Continues with mild abdominal discomfort.  Passing flatus.  Continues to not to mobilize due to pain.  No other questions or concerns at this time.  Denies headache, no dizziness, no chest pain, no shortness of breath, no fever/chills/night sweats, no nausea/vomiting/diarrhea, no weakness, no fatigue, no paresthesias.  Nursing reports that family member fell on patient while trying to give a "hug" yesterday with resultant abdominal pain, otherwise no acute events overnight per nurse staff.  Objective: Vitals:   07/25/21 1910 07/25/21 2226 07/26/21 0205 07/26/21 0421  BP: (!) 141/99 (!) 143/96 (!) 132/96  133/85  Pulse: (!) 121 [name])  121 (!) 112 (!) 110  Resp: 16 16 16 16   Temp: 99.7 F (37.6 C) 99.6 F (37.6 C) 99 F (37.2 C) 98.9 F (37.2 C)  TempSrc: Oral Oral Oral Oral  SpO2: 98% 97% 99% 97%  Weight:      Height:        Intake/Output Summary (Last 24 hours) at 07/26/2021 1044 Last data filed at 07/26/2021 0600 Gross per 24 hour  Intake 720 ml  Output --  Net 720 ml   Filed Weights   07/19/21 1942 07/22/21 1209  Weight: 77.1 kg 77.1 kg    Examination:  General exam: Appears calm and comfortable  Respiratory system: Clear to auscultation. Respiratory effort normal. Cardiovascular system: S1 & S2 heard, RRR. No JVD, murmurs, rubs, gallops or clicks. No pedal edema. Gastrointestinal system: Abdomen soft, mild distention, mild tenderness to palpation, no rebound/guarding. No organomegaly or masses felt. Normal bowel sounds heard.  Surgical incision noted, clean/dry/intact Central nervous system: Alert and oriented. No focal neurological deficits. Extremities: Symmetric 5 x 5 power. Skin: No rashes, lesions or ulcers Psychiatry: Judgement and insight appear normal. Mood & affect appropriate.     Data Reviewed: I have personally reviewed following labs and imaging studies  CBC: Recent Labs  Lab 07/19/21 1952 07/20/21 0740 07/22/21 0351 07/23/21 0326 07/24/21 0757 07/25/21 0337  WBC 13.2* 8.1  --  9.4 18.6* 18.4*  NEUTROABS  --  5.5  --   --   --   --   HGB 7.0* 6.0* 8.6* 9.8* 9.8* 9.1*  HCT 26.1* 22.2* 30.1* 34.1* 33.0* 31.3*  MCV 64.0* 63.4*  --  69.2* 68.2* 69.9*  PLT 621* 504*  --  592* 579* 720*   Basic Metabolic Panel: Recent Labs  Lab 07/19/21 1952 07/20/21 0740 07/23/21 0326 07/24/21 0757  NA 136 137 136 136  K 3.9 4.2 3.5 3.8  CL 101 105 102 99  CO2 25 25 26 22   GLUCOSE 105* 110* 98 100*  BUN 15 9 9 11   CREATININE 0.99 0.82 0.89 0.76  CALCIUM 9.2 8.6* 9.4 9.0  MG  --   --   --  2.2   GFR: Estimated Creatinine Clearance: 114.2 mL/min (by C-G  formula based on SCr of 0.76 mg/dL). Liver Function Tests: Recent Labs  Lab 07/19/21 1952 07/20/21 0740  AST 14* 18  ALT 9 8  ALKPHOS 69 63  BILITOT 0.4 0.3  PROT 9.1* 7.8  ALBUMIN 4.3 3.5   Recent Labs  Lab 07/19/21 1952  LIPASE 55*   No results for input(s): AMMONIA in the last 168 hours. Coagulation Profile: No results for input(s): INR, PROTIME in the last 168 hours. Cardiac Enzymes: No results for input(s): CKTOTAL, CKMB, CKMBINDEX, TROPONINI in the last 168 hours. BNP (last 3 results) No results for input(s): PROBNP in the last 8760 hours. HbA1C: No results for input(s): HGBA1C in the last 72 hours. CBG: No results for input(s): GLUCAP in the last 168 hours. Lipid Profile: No results for input(s): CHOL, HDL, LDLCALC, TRIG, CHOLHDL, LDLDIRECT in the last 72 hours. Thyroid Function Tests: No results for input(s): TSH, T4TOTAL, FREET4, T3FREE, THYROIDAB in the last 72 hours. Anemia Panel: No results for input(s): VITAMINB12, FOLATE, FERRITIN, TIBC, IRON, RETICCTPCT in the last 72 hours. Sepsis Labs: No results for input(s): PROCALCITON, LATICACIDVEN in the last 168 hours.  Recent Results (from the past 240 hour(s))  Resp Panel by RT-PCR (Flu A&B, Covid) Nasopharyngeal Swab     Status: None  Collection Time: 07/19/21 10:59 PM   Specimen: Nasopharyngeal Swab; Nasopharyngeal(NP) swabs in vial transport medium  Result Value Ref Range Status   SARS Coronavirus 2 by RT PCR NEGATIVE NEGATIVE Final    Comment: (NOTE) SARS-CoV-2 target nucleic acids are NOT DETECTED.  The SARS-CoV-2 RNA is generally detectable in upper respiratory specimens during the acute phase of infection. The lowest concentration of SARS-CoV-2 viral copies this assay can detect is 138 copies/mL. A negative result does not preclude SARS-Cov-2 infection and should not be used as the sole basis for treatment or other patient management decisions. A negative result may occur with  improper specimen  collection/handling, submission of specimen other than nasopharyngeal swab, presence of viral mutation(s) within the areas targeted by this assay, and inadequate number of viral copies(<138 copies/mL). A negative result must be combined with clinical observations, patient history, and epidemiological information. The expected result is Negative.  Fact Sheet for Patients:  EntrepreneurPulse.com.au  Fact Sheet for Healthcare Providers:  IncredibleEmployment.be  This test is no t yet approved or cleared by the [place] FDA and  has been authorized for detection and/or diagnosis of SARS-CoV-2 by FDA under an Emergency Use Authorization (EUA). This EUA will remain  in effect (meaning this test can be used) for the duration of the COVID-19 declaration under Section 564(b)(1) of the Act, 21 U.S.C.section 360bbb-3(b)(1), unless the authorization is terminated  or revoked sooner.       Influenza A by PCR NEGATIVE NEGATIVE Final   Influenza B by PCR NEGATIVE NEGATIVE Final    Comment: (NOTE) The Xpert Xpress SARS-CoV-2/FLU/RSV plus assay is intended as an aid in the diagnosis of influenza from Nasopharyngeal swab specimens and should not be used as a sole basis for treatment. Nasal washings and aspirates are unacceptable for Xpert Xpress SARS-CoV-2/FLU/RSV testing.  Fact Sheet for Patients: EntrepreneurPulse.com.au  Fact Sheet for Healthcare Providers: IncredibleEmployment.be  This test is not yet approved or cleared by the [place] FDA and has been authorized for detection and/or diagnosis of SARS-CoV-2 by FDA under an Emergency Use Authorization (EUA). This EUA will remain in effect (meaning this test can be used) for the duration of the COVID-19 declaration under Section 564(b)(1) of the Act, 21 U.S.C. section 360bbb-3(b)(1), unless the authorization is terminated or revoked.  Performed at [facility], [address] [place], [place] [address]   Surgical pcr screen     Status: None   Collection Time: 07/23/21  6:07 AM   Specimen: Nasal Mucosa; Nasal Swab  Result Value Ref Range Status   MRSA, PCR NEGATIVE NEGATIVE Final   Staphylococcus aureus NEGATIVE NEGATIVE Final    Comment: (NOTE) The Xpert SA Assay (FDA approved for NASAL specimens in patients 56 years of age and older), is one component of a comprehensive surveillance program. It is not intended to diagnose infection nor to guide or monitor treatment. Performed at [facility], [place] [address], [place], [place] [address]          Radiology Studies: No results found.      Scheduled Meds:  acetaminophen  1,000 mg Oral TID   carvedilol  25 mg Oral BID WC   cyclobenzaprine  10 mg Oral BID   docusate sodium  100 mg Oral BID   feeding supplement  237 mL Oral BID BM   gabapentin  300 mg Oral TID   tapentadol HCl  75 mg Oral QID   Continuous Infusions:   LOS: 6 days  Time spent: 39 minutes spent on chart review, discussion with nursing staff, consultants, updating family and interview/physical exam; more than 50% of that time was spent in counseling and/or coordination of care.    Dezi Schaner J [place] DO [facility] Available via Epic secure chat 7am-7pm After these hours, please refer to coverage provider listed on [email or web address] 07/26/2021, 10:44 AM

## 2021-07-26 NOTE — Plan of Care (Signed)
Plan of care reviewed and discussed with the patient. 

## 2021-07-26 NOTE — Progress Notes (Signed)
3 Days Post-Op   Subjective/Chief Complaint: Passing flatus - no BM yet, although he feels like he needs to go Tolerating full liquids without nausea Still using IV dilaudid on top of PO oxycodone   Objective: Vital signs in last 24 hours: Temp:  [98.1 F (36.7 C)-99.7 F (37.6 C)] 98.9 F (37.2 C) (11/25 0421) Pulse Rate:  [110-123] 110 (11/25 0421) Resp:  [16-18] 16 (11/25 0421) BP: (132-154)/(85-106) 133/85 (11/25 0421) SpO2:  [94 %-99 %] 97 % (11/25 0421) Last BM Date: 07/22/21  Intake/Output from previous day: 11/24 0701 - 11/25 0700 In: 720 [P.O.:720] Out: -  Intake/Output this shift: No intake/output data recorded.    Gen:  Alert, NAD, pleasant Card:  tachycardic 110s Pulm:  rate and effort normal Abd: Soft, mild distension, mild tenderness to palpation, +BS, incisions cdi    Lab Results:  Recent Labs    07/24/21 0757 07/25/21 0337  WBC 18.6* 18.4*  HGB 9.8* 9.1*  HCT 33.0* 31.3*  PLT 579* 622*   BMET Recent Labs    07/24/21 0757  NA 136  K 3.8  CL 99  CO2 22  GLUCOSE 100*  BUN 11  CREATININE 0.76  CALCIUM 9.0   PT/INR No results for input(s): LABPROT, INR in the last 72 hours. ABG No results for input(s): PHART, HCO3 in the last 72 hours.  Invalid input(s): PCO2, PO2  Studies/Results: No results found.  Anti-infectives: Anti-infectives (From admission, onward)    Start     Dose/Rate Route Frequency Ordered Stop   07/23/21 1030  cefoTEtan (CEFOTAN) 2 g in sodium chloride 0.9 % 100 mL IVPB        2 g 200 mL/hr over 30 Minutes Intravenous On call to O.R. 07/22/21 1628 07/23/21 1121       Assessment/Plan: Right colon mass POD#3 S/p XI ROBOT ASSISTED LAPAROSCOPIC RIGHT COLECTOMY  11/22 Dr. Rosendo Gros - surgical path pending, bx from flex sig Invasive colorectal adenocarcinoma. Primary team contacting oncology for outpatient appointment - CEA pending - Reorder home pain medications (Nucynta, flexeril, gabapentin). Increase Oxycodone IR  to 15 mg PO q4 PRN.  Schedule tylenol - Mobilize, encourage ambulation; PT consult pending - Advance to full liquids   ID - cefotetan periop; monitor WBC FEN - FLD VTE - SCDs, ok for chemical dvt prophylaxis from surgical standpoint Foley - none   HTN GERD Seizure d/o - last seizure 2017, not on medication Remote h/o alcohol abuse - 2017 Microcytic anemia Chronic pain   LOS: 6 days    Joseph Haney 07/26/2021

## 2021-07-26 NOTE — Anesthesia Postprocedure Evaluation (Signed)
Anesthesia Post Note  Patient: Joseph Haney  Procedure(s) Performed: XI ROBOT ASSISTED LAPAROSCOPIC RIGHT COLECTOMY (Abdomen)     Patient location during evaluation: PACU Anesthesia Type: General Level of consciousness: sedated and patient cooperative Pain management: pain level controlled Vital Signs Assessment: post-procedure vital signs reviewed and stable Respiratory status: spontaneous breathing Cardiovascular status: stable Anesthetic complications: no   No notable events documented.  Last Vitals:  Vitals:   07/26/21 0205 07/26/21 0421  BP: (!) 132/96 133/85  Pulse: (!) 112 (!) 110  Resp: 16 16  Temp: 37.2 C 37.2 C  SpO2: 99% 97%    Last Pain:  Vitals:   07/26/21 0440  TempSrc:   PainSc: [place]

## 2021-07-27 DIAGNOSIS — C189 Malignant neoplasm of colon, unspecified: Secondary | ICD-10-CM | POA: Diagnosis not present

## 2021-07-27 LAB — CBC
HCT: 28.5 % — ABNORMAL LOW (ref 39.0–52.0)
Hemoglobin: 8.1 g/dL — ABNORMAL LOW (ref 13.0–17.0)
MCH: 20.2 pg — ABNORMAL LOW (ref 26.0–34.0)
MCHC: 28.4 g/dL — ABNORMAL LOW (ref 30.0–36.0)
MCV: 71.1 fL — ABNORMAL LOW (ref 80.0–100.0)
Platelets: 508 10*3/uL — ABNORMAL HIGH (ref 150–400)
RBC: 4.01 MIL/uL — ABNORMAL LOW (ref 4.22–5.81)
RDW: 25.4 % — ABNORMAL HIGH (ref 11.5–15.5)
WBC: 12.9 10*3/uL — ABNORMAL HIGH (ref 4.0–10.5)
nRBC: 0 % (ref 0.0–0.2)

## 2021-07-27 LAB — BASIC METABOLIC PANEL
Anion gap: 12 (ref 5–15)
BUN: 25 mg/dL — ABNORMAL HIGH (ref 6–20)
CO2: 27 mmol/L (ref 22–32)
Calcium: 8.7 mg/dL — ABNORMAL LOW (ref 8.9–10.3)
Chloride: 95 mmol/L — ABNORMAL LOW (ref 98–111)
Creatinine, Ser: 0.92 mg/dL (ref 0.61–1.24)
GFR, Estimated: >60 mL/min (ref >60)
Glucose, Bld: 104 mg/dL — ABNORMAL HIGH (ref 70–99)
Potassium: 3.4 mmol/L — ABNORMAL LOW (ref 3.5–5.1)
Sodium: 134 mmol/L — ABNORMAL LOW (ref 135–145)

## 2021-07-27 MED ORDER — POTASSIUM CHLORIDE CRYS ER 20 MEQ PO TBCR
40.0000 meq | EXTENDED_RELEASE_TABLET | Freq: Once | ORAL | Status: AC
  Administered 2021-07-27: 40 meq via ORAL
  Filled 2021-07-27: qty 2

## 2021-07-27 MED ORDER — FERROUS SULFATE 325 (65 FE) MG PO TABS
325.0000 mg | ORAL_TABLET | Freq: Two times a day (BID) | ORAL | Status: DC
  Administered 2021-07-27 – 2021-08-01 (×10): 325 mg via ORAL
  Filled 2021-07-27 (×10): qty 1

## 2021-07-27 MED ORDER — BISACODYL 10 MG RE SUPP
10.0000 mg | Freq: Once | RECTAL | Status: AC
  Administered 2021-07-27: 10 mg via RECTAL
  Filled 2021-07-27: qty 1

## 2021-07-27 NOTE — Plan of Care (Signed)
  Problem: Clinical Measurements: Goal: Respiratory complications will improve Outcome: Progressing   Problem: Clinical Measurements: Goal: Cardiovascular complication will be avoided Outcome: Progressing   Problem: Activity: Goal: Risk for activity intolerance will decrease Outcome: Progressing   Problem: Nutrition: Goal: Adequate nutrition will be maintained Outcome: Progressing   Problem: Elimination: Goal: Will not experience complications related to bowel motility Outcome: Progressing   Problem: Pain Managment: Goal: General experience of comfort will improve Outcome: Progressing

## 2021-07-27 NOTE — Progress Notes (Signed)
PROGRESS NOTE    Joseph Haney  NAT:[id number] DOB: [birth year] DOA: 07/19/2021 PCP: Merryl Hacker, No    Brief Narrative:  Joseph Haney is a 45 year old male with past medical history significant for essential hypertension, GERD, seizure disorder, EtOH abuse who presented to [facility] ED on 11/18 with 1 month history of progressive abdominal pain, nausea/vomiting.  Patient has not followed up with her PCP in greater than 2 years since office closed and has not been taking medications since that timeframe.  He has been attempting to diet which consists of eating only mandrin's for the past 2 months.  Patient also has apparently been vomiting without letting any of his family know.  CT abdomen/pelvis notable for focal area of high-grade narrowing proximal ascending colon.  Gastroenterology consulted.  Hospitalist consulted for further evaluation and management of abdominal pain with intractable nausea and vomiting with high-grade narrowing proximal ascending colon.   Assessment & Plan:   Principal Problem:   Colon adenocarcinoma ([place]) Active Problems:   Alcohol abuse   Seizure disorder ([place])   Colonic mass   Iron deficiency anemia due to chronic blood loss   Invasive colorectal adenocarcinoma Patient presenting to ED with progressive abdominal pain associate with nausea/vomiting.  CT abdomen/pelvis notable for focal area of high-grade narrowing proximal ascending colon.  Gastroenterology was consulted and patient underwent colonoscopy by Dr. Alessandra Bevels on 11/21 strictured distal ascending colon which was biopsied.  Pathology from colonoscopy consistent with invasive colorectal adenocarcinoma, moderately differentiated.  General surgery was consulted and patient underwent robotic assisted laparoscopic right colectomy by Dr. Rosendo Gros on 07/23/2021. Pathology from colectomy with invasive moderately differentiated colonic adenocarcinoma extending into the pericolonic connective tissue, metastatic carcinoma and  9 of 19 lymph nodes. --CEA: 2.9, normal --Diet advanced to soft today --Nucynta 75 mg QID --Oxycodone 15 mg q4h moderate pain --Dilaudid 1-2 mg IV q3h PRN severe --Oncology arranging outpatient appointment --Likely discharge home 1-2 days  Essential hypertension --Carvedilol to 25 mg p.o. twice daily --Metoprolol 5mg  IV q6h PRN SBP >180 or DBP >90 and/or sustained HR >120 --Continue monitor blood pressure and adjust accordingly  Seizure disorder Last seizure reported 2017, currently not on medication outpatient. --Keppra 500 mg twice daily  History of alcohol abuse Patient denies any recent alcohol use.  Microcytic anemia Etiology likely secondary to colon cancer as above.  Iron 11, TIBC 470, ferritin 2.  Transfused 2 unit PRBC 11/20 with repeat hemoglobin 8.6>9.8>9.1>8.1 --ferrous sulfate 325mg  PO BID  Leukocytosis WBC 9.4>18.6>18.4>12.9; likely reactive postoperatively. --Monitor fever curve   DVT prophylaxis: SCDs Start: 07/20/21 1716   Code Status: Full Code Family Communication: Updated family present at bedside this morning  Disposition Plan:  Level of care: Med-Surg Status is: Inpatient  Remains inpatient appropriate because: Needs better pain control, advancement of diet and mobilization     Consultants:  [facility], Dr. Mariana Arn - signed off 11/23 General surgery  Procedures:  Colonoscopy, Dr. Alessandra Bevels 11/21 Right robotic colectomy, Dr. Rosendo Gros 11/22  Antimicrobials:  None   Subjective: Patient seen examined at bedside, resting comfortably.  Family present.  General surgery advancing diet to soft today.  Hopeful for discharge in 1-2 days.  Patient with no specific complaints this morning, pain slowly improving.  Continues with flatus without bowel movement yet.    Denies headache, no dizziness, no chest pain, no shortness of breath, no fever/chills/night sweats, no nausea/vomiting/diarrhea, no weakness, no fatigue, no paresthesias.  No acute events  overnight per nursing staff.  Objective: Vitals:   07/26/21 1357 07/26/21  2155 07/27/21 0449 07/27/21 1330  BP: 113/69 128/79 120/79 119/81  Pulse: (!) 106 (!) 107 (!) 101 (!) 103  Resp: 18 18 18 16   Temp: 98.1 F (36.7 C) 98.7 F (37.1 C) 98.8 F (37.1 C)   TempSrc:  Oral Oral   SpO2: 98% 95% 97% 96%  Weight:      Height:        Intake/Output Summary (Last 24 hours) at 07/27/2021 1351 Last data filed at 07/27/2021 1330 Gross per 24 hour  Intake 960 ml  Output 800 ml  Net 160 ml   Filed Weights   07/19/21 1942 07/22/21 1209  Weight: 77.1 kg 77.1 kg    Examination:  General exam: Appears calm and comfortable  Respiratory system: Clear to auscultation. Respiratory effort normal.  On room air Cardiovascular system: S1 & S2 heard, RRR. No JVD, murmurs, rubs, gallops or clicks. No pedal edema. Gastrointestinal system: Abdomen soft, mild distention, mild tenderness to palpation, no rebound/guarding. No organomegaly or masses felt. Normal bowel sounds heard.  Surgical incision noted, clean/dry/intact Central nervous system: Alert and oriented. No focal neurological deficits. Extremities: Symmetric 5 x 5 power. Skin: No rashes, lesions or ulcers Psychiatry: Judgement and insight appear normal. Mood & affect appropriate.     Data Reviewed: I have personally reviewed following labs and imaging studies  CBC: Recent Labs  Lab 07/22/21 0351 07/23/21 0326 07/24/21 0757 07/25/21 0337 07/27/21 0323  WBC  --  9.4 18.6* 18.4* 12.9*  HGB 8.6* 9.8* 9.8* 9.1* 8.1*  HCT 30.1* 34.1* 33.0* 31.3* 28.5*  MCV  --  69.2* 68.2* 69.9* 71.1*  PLT  --  592* 579* 622* 834*   Basic Metabolic Panel: Recent Labs  Lab 07/23/21 0326 07/24/21 0757 07/27/21 0323  NA 136 136 134*  K 3.5 3.8 3.4*  CL 102 99 95*  CO2 26 22 27   GLUCOSE 98 100* 104*  BUN 9 11 25*  CREATININE 0.89 0.76 0.92  CALCIUM 9.4 9.0 8.7*  MG  --  2.2  --    GFR: Estimated Creatinine Clearance: 99.3 mL/min (by  C-G formula based on SCr of 0.92 mg/dL). Liver Function Tests: No results for input(s): AST, ALT, ALKPHOS, BILITOT, PROT, ALBUMIN in the last 168 hours.  No results for input(s): LIPASE, AMYLASE in the last 168 hours.  No results for input(s): AMMONIA in the last 168 hours. Coagulation Profile: No results for input(s): INR, PROTIME in the last 168 hours. Cardiac Enzymes: No results for input(s): CKTOTAL, CKMB, CKMBINDEX, TROPONINI in the last 168 hours. BNP (last 3 results) No results for input(s): PROBNP in the last 8760 hours. HbA1C: No results for input(s): HGBA1C in the last 72 hours. CBG: No results for input(s): GLUCAP in the last 168 hours. Lipid Profile: No results for input(s): CHOL, HDL, LDLCALC, TRIG, CHOLHDL, LDLDIRECT in the last 72 hours. Thyroid Function Tests: No results for input(s): TSH, T4TOTAL, FREET4, T3FREE, THYROIDAB in the last 72 hours. Anemia Panel: No results for input(s): VITAMINB12, FOLATE, FERRITIN, TIBC, IRON, RETICCTPCT in the last 72 hours. Sepsis Labs: No results for input(s): PROCALCITON, LATICACIDVEN in the last 168 hours.  Recent Results (from the past 240 hour(s))  Resp Panel by RT-PCR (Flu A&B, Covid) Nasopharyngeal Swab     Status: None   Collection Time: 07/19/21 10:59 PM   Specimen: Nasopharyngeal Swab; Nasopharyngeal(NP) swabs in vial transport medium  Result Value Ref Range Status   SARS Coronavirus 2 by RT PCR NEGATIVE NEGATIVE Final    Comment: (  NOTE) SARS-CoV-2 target nucleic acids are NOT DETECTED.  The SARS-CoV-2 RNA is generally detectable in upper respiratory specimens during the acute phase of infection. The lowest concentration of SARS-CoV-2 viral copies this assay can detect is 138 copies/mL. A negative result does not preclude SARS-Cov-2 infection and should not be used as the sole basis for treatment or other patient management decisions. A negative result may occur with  improper specimen collection/handling, submission  of specimen other than nasopharyngeal swab, presence of viral mutation(s) within the areas targeted by this assay, and inadequate number of viral copies(<138 copies/mL). A negative result must be combined with clinical observations, patient history, and epidemiological information. The expected result is Negative.  Fact Sheet for Patients:  EntrepreneurPulse.com.au  Fact Sheet for Healthcare Providers:  IncredibleEmployment.be  This test is no t yet approved or cleared by the [place] FDA and  has been authorized for detection and/or diagnosis of SARS-CoV-2 by FDA under an Emergency Use Authorization (EUA). This EUA will remain  in effect (meaning this test can be used) for the duration of the COVID-19 declaration under Section 564(b)(1) of the Act, 21 U.S.C.section 360bbb-3(b)(1), unless the authorization is terminated  or revoked sooner.       Influenza A by PCR NEGATIVE NEGATIVE Final   Influenza B by PCR NEGATIVE NEGATIVE Final    Comment: (NOTE) The Xpert Xpress SARS-CoV-2/FLU/RSV plus assay is intended as an aid in the diagnosis of influenza from Nasopharyngeal swab specimens and should not be used as a sole basis for treatment. Nasal washings and aspirates are unacceptable for Xpert Xpress SARS-CoV-2/FLU/RSV testing.  Fact Sheet for Patients: EntrepreneurPulse.com.au  Fact Sheet for Healthcare Providers: IncredibleEmployment.be  This test is not yet approved or cleared by the [place] FDA and has been authorized for detection and/or diagnosis of SARS-CoV-2 by FDA under an Emergency Use Authorization (EUA). This EUA will remain in effect (meaning this test can be used) for the duration of the COVID-19 declaration under Section 564(b)(1) of the Act, 21 U.S.C. section 360bbb-3(b)(1), unless the authorization is terminated or revoked.  Performed at [facility], [address], [place], [place] [address]   Surgical pcr screen     Status: None   Collection Time: 07/23/21  6:07 AM   Specimen: Nasal Mucosa; Nasal Swab  Result Value Ref Range Status   MRSA, PCR NEGATIVE NEGATIVE Final   Staphylococcus aureus NEGATIVE NEGATIVE Final    Comment: (NOTE) The Xpert SA Assay (FDA approved for NASAL specimens in patients 14 years of age and older), is one component of a comprehensive surveillance program. It is not intended to diagnose infection nor to guide or monitor treatment. Performed at [facility], [place] [address], [place], [place] [address]          Radiology Studies: No results found.      Scheduled Meds:  acetaminophen  1,000 mg Oral TID   carvedilol  25 mg Oral BID WC   cyclobenzaprine  10 mg Oral BID   docusate sodium  100 mg Oral BID   feeding supplement  237 mL Oral BID BM   gabapentin  300 mg Oral TID   tapentadol HCl  75 mg Oral QID   Continuous Infusions:   LOS: 7 days    Time spent: 39 minutes spent on chart review, discussion with nursing staff, consultants, updating family and interview/physical exam; more than 50% of that time was spent in counseling and/or coordination of care.  Joseph Haney J [place] DO [facility] Available via Epic secure chat 7am-7pm After these hours, please refer to coverage provider listed on [email or web address] 07/27/2021, 1:51 PM

## 2021-07-27 NOTE — Progress Notes (Signed)
4 Days Post-Op   Subjective/Chief Complaint: Hungry - wants to eat more More flatus, no BM yet Less IV pain med use   Objective: Vital signs in last 24 hours: Temp:  [98.1 F (36.7 C)-98.8 F (37.1 C)] 98.8 F (37.1 C) (11/26 0449) Pulse Rate:  [101-107] 101 (11/26 0449) Resp:  [18] 18 (11/26 0449) BP: (113-128)/(69-79) 120/79 (11/26 0449) SpO2:  [95 %-98 %] 97 % (11/26 0449) Last BM Date: 07/21/21  Intake/Output from previous day: 11/25 0701 - 11/26 0700 In: -  Out: 600 [Urine:600] Intake/Output this shift: Total I/O In: 240 [P.O.:240] Out: 200 [Urine:200]  Gen:  Alert, NAD, pleasant Card:  RRR Pulm:  rate and effort normal Abd: Soft, mild distension, mild tenderness to palpation, +BS, incisions cdi    Pathology report shows invasive moderately differentiated adenocarcinoma 4.5 cm, negative margins, 9/19 lymph nodes positive  pT4a, pN2b.  CEA 2.9.    Lab Results:  Recent Labs    07/25/21 0337 07/27/21 0323  WBC 18.4* 12.9*  HGB 9.1* 8.1*  HCT 31.3* 28.5*  PLT 622* 508*   BMET Recent Labs    07/27/21 0323  NA 134*  K 3.4*  CL 95*  CO2 27  GLUCOSE 104*  BUN 25*  CREATININE 0.92  CALCIUM 8.7*   PT/INR No results for input(s): LABPROT, INR in the last 72 hours. ABG No results for input(s): PHART, HCO3 in the last 72 hours.  Invalid input(s): PCO2, PO2  Studies/Results: No results found.  Anti-infectives: Anti-infectives (From admission, onward)    Start     Dose/Rate Route Frequency Ordered Stop   07/23/21 1030  cefoTEtan (CEFOTAN) 2 g in sodium chloride 0.9 % 100 mL IVPB        2 g 200 mL/hr over 30 Minutes Intravenous On call to O.R. 07/22/21 1628 07/23/21 1121       Assessment/Plan: Right colon mass POD#4 S/p XI ROBOT ASSISTED LAPAROSCOPIC RIGHT COLECTOMY  11/22 Dr. 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  Discussed with patient -Primary team contacting oncology for  outpatient appointment - CEA 2.9 - Reorder home pain medications (Nucynta, flexeril, gabapentin). Increase Oxycodone IR to 15 mg PO q4 PRN.  Schedule tylenol - Mobilize, encourage ambulation; PT consult pending - Advance diet as tolerated Dulcolax suppository/ Miralax   ID - cefotetan periop; monitor WBC FEN - FLD VTE - SCDs, ok for chemical dvt prophylaxis from surgical standpoint Foley - none   HTN GERD Seizure d/o - last seizure 2017, not on medication Remote h/o alcohol abuse - 2017 Microcytic anemia Chronic pain   Probable discharge tomorrow or Monday  LOS: 7 days    Joseph Haney 07/27/2021

## 2021-07-28 ENCOUNTER — Inpatient Hospital Stay (HOSPITAL_COMMUNITY): Payer: Medicaid Other

## 2021-07-28 ENCOUNTER — Encounter (HOSPITAL_COMMUNITY): Payer: Self-pay | Admitting: Internal Medicine

## 2021-07-28 DIAGNOSIS — C189 Malignant neoplasm of colon, unspecified: Secondary | ICD-10-CM | POA: Diagnosis not present

## 2021-07-28 MED ORDER — IOHEXOL 350 MG/ML SOLN
60.0000 mL | Freq: Once | INTRAVENOUS | Status: AC | PRN
  Administered 2021-07-28: 13:00:00 60 mL via INTRAVENOUS

## 2021-07-28 NOTE — Plan of Care (Signed)
  Problem: Coping: Goal: Level of anxiety will decrease Outcome: Progressing   Problem: Pain Managment: Goal: General experience of comfort will improve Outcome: Progressing   Problem: Safety: Goal: Ability to remain free from injury will improve Outcome: Progressing   

## 2021-07-28 NOTE — Discharge Instructions (Signed)
[facility], [place] [phone]  ABDOMINAL SURGERY: POST OP INSTRUCTIONS  Always review your discharge instruction sheet given to you by the facility where your surgery was performed.  IF YOU HAVE DISABILITY OR FAMILY LEAVE FORMS, YOU MUST BRING THEM TO THE OFFICE FOR PROCESSING.  PLEASE DO NOT GIVE THEM TO YOUR DOCTOR.  A prescription for pain medication may be given to you upon discharge.  Take your pain medication as prescribed, if needed.  If narcotic pain medicine is not needed, then you may take acetaminophen (Tylenol) or ibuprofen (Advil) as needed. Take your usually prescribed medications unless otherwise directed. If you need a refill on your pain medication, please contact your pharmacy. They will contact our office to request authorization.  Prescriptions will not be filled after 5pm or on week-ends. You should follow a light diet the first few days after arrival home, such as soup and crackers, pudding, etc.unless your doctor has advised otherwise. A high-fiber, low fat diet can be resumed as tolerated.   Be sure to include lots of fluids daily. Most patients will experience some swelling and bruising on the chest and neck area.  Ice packs will help.  Swelling and bruising can take several days to resolve Most patients will experience some swelling and bruising in the area of the incision. Ice pack will help. Swelling and bruising can take several days to resolve..  It is common to experience some constipation if taking pain medication after surgery.  Increasing fluid intake and taking a stool softener will usually help or prevent this problem from occurring.  A mild laxative (Milk of Magnesia or Miralax) should be taken according to package directions if there are no bowel movements after 48 hours.  Your surgeon used skin glue on the incision.  You may shower daily.  The glue will flake off over the next 2-3 weeks.   ACTIVITIES:  You may resume regular (light) daily  activities beginning the next day--such as daily self-care, walking, climbing stairs--gradually increasing activities as tolerated.  You may have sexual intercourse when it is comfortable.  Refrain from any heavy lifting or straining until approved by your doctor. You may drive when you no longer are taking prescription pain medication, you can comfortably wear a seatbelt, and you can safely maneuver your car and apply brakes Return to Work: ___________________________________ Joseph Haney should see your doctor in the office for a follow-up appointment approximately two weeks after your surgery.  Make sure that you call for this appointment within a day or two after you arrive home to insure a convenient appointment time. OTHER INSTRUCTIONS:  _____________________________________________________________ _____________________________________________________________  WHEN TO CALL YOUR DOCTOR: Fever over 101.0 Inability to urinate Nausea and/or vomiting Extreme swelling or bruising Continued bleeding from incision. Increased pain, redness, or drainage from the incision. Difficulty swallowing or breathing Muscle cramping or spasms. Numbness or tingling in hands or feet or around lips.  The clinic staff is available to answer your questions during regular business hours.  Please don't hesitate to call and ask to speak to one of the nurses if you have concerns.  For further questions, please visit [email or web address]

## 2021-07-28 NOTE — Progress Notes (Signed)
PROGRESS NOTE    Joseph Haney  PIR:[id number] DOB: July 07, 1976 DOA: 07/19/2021 PCP: Merryl Hacker, No    Brief Narrative:  Joseph Haney is a 45 year old male with past medical history significant for essential hypertension, GERD, seizure disorder, EtOH abuse who presented to [facility] ED on 11/18 with 1 month history of progressive abdominal pain, nausea/vomiting.  Patient has not followed up with her PCP in greater than 2 years since office closed and has not been taking medications since that timeframe.  He has been attempting to diet which consists of eating only mandrin's for the past 2 months.  Patient also has apparently been vomiting without letting any of his family know.  CT abdomen/pelvis notable for focal area of high-grade narrowing proximal ascending colon.  Gastroenterology consulted.  Hospitalist consulted for further evaluation and management of abdominal pain with intractable nausea and vomiting with high-grade narrowing proximal ascending colon.   Assessment & Plan:   Principal Problem:   Colon adenocarcinoma ([place]) Active Problems:   Alcohol abuse   Seizure disorder ([place])   Colonic mass   Iron deficiency anemia due to chronic blood loss   Invasive colorectal adenocarcinoma Patient presenting to ED with progressive abdominal pain associate with nausea/vomiting.  CT abdomen/pelvis notable for focal area of high-grade narrowing proximal ascending colon.  Gastroenterology was consulted and patient underwent colonoscopy by Dr. Alessandra Bevels on 11/21 strictured distal ascending colon which was biopsied.  Pathology from colonoscopy consistent with invasive colorectal adenocarcinoma, moderately differentiated.  General surgery was consulted and patient underwent robotic assisted laparoscopic right colectomy by Dr. Rosendo Gros on 07/23/2021. Pathology from colectomy with invasive moderately differentiated colonic adenocarcinoma extending into the pericolonic connective tissue, metastatic carcinoma and  9 of 19 lymph nodes. --CEA: 2.9, normal --Diet advanced to soft yesterday, currently tolerating --Nucynta 75 mg QID --Oxycodone 15 mg q4h moderate pain --Dilaudid 1 mg IV q3h PRN severe --Oncology arranging outpatient appointment --Likely discharge home tomorrow  Essential hypertension --Carvedilol to 25 mg p.o. twice daily --Metoprolol 5mg  IV q6h PRN SBP >180 or DBP >90 and/or sustained HR >120 --Continue monitor blood pressure and adjust accordingly  Seizure disorder Last seizure reported 2017, currently not on medication outpatient. --Keppra 500 mg twice daily  History of alcohol abuse Patient denies any recent alcohol use.  Microcytic anemia Etiology likely secondary to colon cancer as above.  Iron 11, TIBC 470, ferritin 2.  Transfused 2 unit PRBC 11/20 with repeat hemoglobin 8.6>9.8>9.1>8.1 --ferrous sulfate 325mg  PO BID  Leukocytosis WBC 9.4>18.6>18.4>12.9; likely reactive postoperatively. --Monitor fever curve   DVT prophylaxis: SCDs Start: 07/20/21 1716   Code Status: Full Code Family Communication: Updated family present at bedside this morning  Disposition Plan:  Level of care: Med-Surg Status is: Inpatient  Remains inpatient appropriate because: Needs better pain control, advancement of diet and mobilization     Consultants:  [facility], Dr. Mariana Arn - signed off 11/23 General surgery  Procedures:  Colonoscopy, Dr. Alessandra Bevels 11/21 Right robotic colectomy, Dr. Rosendo Gros 11/22  Antimicrobials:  None   Subjective: Patient seen examined at bedside, resting comfortably.  Family present at bedside.  Patient with bowel movements overnight.  Tolerating advance soft diet today.  Patient reports that his "pipes broke at home" and awaiting to be fixed and unable to safely discharge today.  Pain overall slowly improving, has been able to ambulate well yesterday.  No other questions or concerns at this time other than continues to be anxious regarding long-term  prognosis of his cancer.  Denies headache, no dizziness, no chest  pain, no shortness of breath, no fever/chills/night sweats, no nausea/vomiting/diarrhea, no weakness, no fatigue, no paresthesias.  No acute events overnight per nursing staff.  Objective: Vitals:   07/27/21 0449 07/27/21 1330 07/27/21 2315 07/28/21 0531  BP: 120/79 119/81 122/81 112/74  Pulse: (!) 101 (!) 103 (!) 105 97  Resp: 18 16 15 17   Temp: 98.8 F (37.1 C)  98.6 F (37 C) 98.7 F (37.1 C)  TempSrc: Oral  Oral Oral  SpO2: 97% 96% 99% 97%  Weight:      Height:        Intake/Output Summary (Last 24 hours) at 07/28/2021 1018 Last data filed at 07/28/2021 4128 Gross per 24 hour  Intake 1680 ml  Output 200 ml  Net 1480 ml   Filed Weights   07/19/21 1942 07/22/21 1209  Weight: 77.1 kg 77.1 kg    Examination:  General exam: Appears calm and comfortable  Respiratory system: Clear to auscultation. Respiratory effort normal.  On room air Cardiovascular system: S1 & S2 heard, RRR. No JVD, murmurs, rubs, gallops or clicks. No pedal edema. Gastrointestinal system: Abdomen soft, mild distention, mild tenderness to palpation, no rebound/guarding. No organomegaly or masses felt. Normal bowel sounds heard.  Surgical incision noted, clean/dry/intact Central nervous system: Alert and oriented. No focal neurological deficits. Extremities: Symmetric 5 x 5 power. Skin: No rashes, lesions or ulcers Psychiatry: Judgement and insight appear normal. Mood & affect appropriate.     Data Reviewed: I have personally reviewed following labs and imaging studies  CBC: Recent Labs  Lab 07/22/21 0351 07/23/21 0326 07/24/21 0757 07/25/21 0337 07/27/21 0323  WBC  --  9.4 18.6* 18.4* 12.9*  HGB 8.6* 9.8* 9.8* 9.1* 8.1*  HCT 30.1* 34.1* 33.0* 31.3* 28.5*  MCV  --  69.2* 68.2* 69.9* 71.1*  PLT  --  592* 579* 622* 786*   Basic Metabolic Panel: Recent Labs  Lab 07/23/21 0326 07/24/21 0757 07/27/21 0323  NA 136 136 134*  K  3.5 3.8 3.4*  CL 102 99 95*  CO2 26 22 27   GLUCOSE 98 100* 104*  BUN 9 11 25*  CREATININE 0.89 0.76 0.92  CALCIUM 9.4 9.0 8.7*  MG  --  2.2  --    GFR: Estimated Creatinine Clearance: 99.3 mL/min (by C-G formula based on SCr of 0.92 mg/dL). Liver Function Tests: No results for input(s): AST, ALT, ALKPHOS, BILITOT, PROT, ALBUMIN in the last 168 hours.  No results for input(s): LIPASE, AMYLASE in the last 168 hours.  No results for input(s): AMMONIA in the last 168 hours. Coagulation Profile: No results for input(s): INR, PROTIME in the last 168 hours. Cardiac Enzymes: No results for input(s): CKTOTAL, CKMB, CKMBINDEX, TROPONINI in the last 168 hours. BNP (last 3 results) No results for input(s): PROBNP in the last 8760 hours. HbA1C: No results for input(s): HGBA1C in the last 72 hours. CBG: No results for input(s): GLUCAP in the last 168 hours. Lipid Profile: No results for input(s): CHOL, HDL, LDLCALC, TRIG, CHOLHDL, LDLDIRECT in the last 72 hours. Thyroid Function Tests: No results for input(s): TSH, T4TOTAL, FREET4, T3FREE, THYROIDAB in the last 72 hours. Anemia Panel: No results for input(s): VITAMINB12, FOLATE, FERRITIN, TIBC, IRON, RETICCTPCT in the last 72 hours. Sepsis Labs: No results for input(s): PROCALCITON, LATICACIDVEN in the last 168 hours.  Recent Results (from the past 240 hour(s))  Resp Panel by RT-PCR (Flu A&B, Covid) Nasopharyngeal Swab     Status: None   Collection Time: 07/19/21 10:59 PM  Specimen: Nasopharyngeal Swab; Nasopharyngeal(NP) swabs in vial transport medium  Result Value Ref Range Status   SARS Coronavirus 2 by RT PCR NEGATIVE NEGATIVE Final    Comment: (NOTE) SARS-CoV-2 target nucleic acids are NOT DETECTED.  The SARS-CoV-2 RNA is generally detectable in upper respiratory specimens during the acute phase of infection. The lowest concentration of SARS-CoV-2 viral copies this assay can detect is 138 copies/mL. A negative result does not  preclude SARS-Cov-2 infection and should not be used as the sole basis for treatment or other patient management decisions. A negative result may occur with  improper specimen collection/handling, submission of specimen other than nasopharyngeal swab, presence of viral mutation(s) within the areas targeted by this assay, and inadequate number of viral copies(<138 copies/mL). A negative result must be combined with clinical observations, patient history, and epidemiological information. The expected result is Negative.  Fact Sheet for Patients:  EntrepreneurPulse.com.au  Fact Sheet for Healthcare Providers:  IncredibleEmployment.be  This test is no t yet approved or cleared by the [place] FDA and  has been authorized for detection and/or diagnosis of SARS-CoV-2 by FDA under an Emergency Use Authorization (EUA). This EUA will remain  in effect (meaning this test can be used) for the duration of the COVID-19 declaration under Section 564(b)(1) of the Act, 21 U.S.C.section 360bbb-3(b)(1), unless the authorization is terminated  or revoked sooner.       Influenza A by PCR NEGATIVE NEGATIVE Final   Influenza B by PCR NEGATIVE NEGATIVE Final    Comment: (NOTE) The Xpert Xpress SARS-CoV-2/FLU/RSV plus assay is intended as an aid in the diagnosis of influenza from Nasopharyngeal swab specimens and should not be used as a sole basis for treatment. Nasal washings and aspirates are unacceptable for Xpert Xpress SARS-CoV-2/FLU/RSV testing.  Fact Sheet for Patients: EntrepreneurPulse.com.au  Fact Sheet for Healthcare Providers: IncredibleEmployment.be  This test is not yet approved or cleared by the [place] FDA and has been authorized for detection and/or diagnosis of SARS-CoV-2 by FDA under an Emergency Use Authorization (EUA). This EUA will remain in effect (meaning this test can be used) for the  duration of the COVID-19 declaration under Section 564(b)(1) of the Act, 21 U.S.C. section 360bbb-3(b)(1), unless the authorization is terminated or revoked.  Performed at [facility], [address] [place], [place] [address]   Surgical pcr screen     Status: None   Collection Time: 07/23/21  6:07 AM   Specimen: Nasal Mucosa; Nasal Swab  Result Value Ref Range Status   MRSA, PCR NEGATIVE NEGATIVE Final   Staphylococcus aureus NEGATIVE NEGATIVE Final    Comment: (NOTE) The Xpert SA Assay (FDA approved for NASAL specimens in patients 77 years of age and older), is one component of a comprehensive surveillance program. It is not intended to diagnose infection nor to guide or monitor treatment. Performed at [facility], [place] [address] [place], [place] [address]          Radiology Studies: No results found.      Scheduled Meds:  acetaminophen  1,000 mg Oral TID   carvedilol  25 mg Oral BID WC   cyclobenzaprine  10 mg Oral BID   docusate sodium  100 mg Oral BID   feeding supplement  237 mL Oral BID BM   ferrous sulfate  325 mg Oral BID WC   gabapentin  300 mg Oral TID   tapentadol HCl  75 mg Oral QID   Continuous Infusions:  LOS: 8 days    Time spent: 38 minutes spent on chart review, discussion with nursing staff, consultants, updating family and interview/physical exam; more than 50% of that time was spent in counseling and/or coordination of care.    Alaisha Eversley J [place] DO [facility] Available via Epic secure chat 7am-7pm After these hours, please refer to coverage provider listed on [email or web address] 07/28/2021, 10:18 AM

## 2021-07-28 NOTE — Progress Notes (Signed)
5 Days Post-Op   Subjective/Chief Complaint: Patient had a large bowel movement and a large amount of flatus yesterday. Feels less distended Pain control improved - only used IV pain meds twice Discharge planned for tomorrow    Objective: Vital signs in last 24 hours: Temp:  [98.6 F (37 C)-98.7 F (37.1 C)] 98.7 F (37.1 C) (11/27 0531) Pulse Rate:  [97-105] 97 (11/27 0531) Resp:  [15-17] 17 (11/27 0531) BP: (112-122)/(74-81) 112/74 (11/27 0531) SpO2:  [96 %-99 %] 97 % (11/27 0531) Last BM Date: 07/27/21  Intake/Output from previous day: 11/26 0701 - 11/27 0700 In: 1440 [P.O.:1440] Out: 400 [Urine:400] Intake/Output this shift: Total I/O In: 480 [P.O.:480] Out: -     Gen:  Alert, NAD, pleasant Card:  RRR Pulm:  rate and effort normal Abd: Soft, mild distension, mild tenderness to palpation, +BS, incisions cdi    Lab Results:  Recent Labs    07/27/21 0323  WBC 12.9*  HGB 8.1*  HCT 28.5*  PLT 508*   BMET Recent Labs    07/27/21 0323  NA 134*  K 3.4*  CL 95*  CO2 27  GLUCOSE 104*  BUN 25*  CREATININE 0.92  CALCIUM 8.7*   PT/INR No results for input(s): LABPROT, INR in the last 72 hours. ABG No results for input(s): PHART, HCO3 in the last 72 hours.  Invalid input(s): PCO2, PO2  Studies/Results: No results found.  Anti-infectives: Anti-infectives (From admission, onward)    Start     Dose/Rate Route Frequency Ordered Stop   07/23/21 1030  cefoTEtan (CEFOTAN) 2 g in sodium chloride 0.9 % 100 mL IVPB        2 g 200 mL/hr over 30 Minutes Intravenous On call to O.R. 07/22/21 1628 07/23/21 1121       Assessment/Plan: Right colon mass POD#4 S/p XI ROBOT ASSISTED LAPAROSCOPIC RIGHT COLECTOMY  11/22 Dr. 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  Discussed with patient -Primary team contacting oncology for outpatient appointment - CEA 2.9 -Staging chest CT scan ordered - Reorder home  pain medications (Nucynta, flexeril, gabapentin). Increase Oxycodone IR to 15 mg PO q4 PRN.  Schedule tylenol - Mobilize, encourage ambulation;  - Regular diet   ID - cefotetan periop FEN - FLD VTE - SCDs, ok for chemical dvt prophylaxis from surgical standpoint Foley - none   HTN GERD Seizure d/o - last seizure 2017, not on medication Remote h/o alcohol abuse - 2017 Microcytic anemia Chronic pain    Probable discharge tomorrow   LOS: 8 days    Joseph Haney 07/28/2021

## 2021-07-28 NOTE — Plan of Care (Signed)
  Problem: Clinical Measurements: Goal: Respiratory complications will improve 07/28/2021 0719 by Elza Rafter, RN Outcome: Progressing 07/28/2021 0714 by Elza Rafter, RN Outcome: Progressing   Problem: Activity: Goal: Risk for activity intolerance will decrease 07/28/2021 0719 by Elza Rafter, RN Outcome: Progressing 07/28/2021 0714 by Elza Rafter, RN Outcome: Progressing   Problem: Elimination: Goal: Will not experience complications related to bowel motility 07/28/2021 0719 by Elza Rafter, RN Outcome: Progressing 07/28/2021 0714 by Elza Rafter, RN Outcome: Progressing

## 2021-07-28 NOTE — Plan of Care (Signed)
  Problem: Activity: Goal: Risk for activity intolerance will decrease Outcome: Progressing   Problem: Clinical Measurements: Goal: Cardiovascular complication will be avoided Outcome: Progressing   Problem: Pain Managment: Goal: General experience of comfort will improve Outcome: Progressing   Problem: Safety: Goal: Ability to remain free from injury will improve Outcome: Progressing

## 2021-07-28 NOTE — Plan of Care (Signed)
  Problem: Activity: Goal: Risk for activity intolerance will decrease Outcome: Progressing   Problem: Pain Managment: Goal: General experience of comfort will improve Outcome: Progressing   Problem: Safety: Goal: Ability to remain free from injury will improve Outcome: Progressing   

## 2021-07-29 DIAGNOSIS — C189 Malignant neoplasm of colon, unspecified: Secondary | ICD-10-CM | POA: Diagnosis not present

## 2021-07-29 LAB — CBC
HCT: 27.4 % — ABNORMAL LOW (ref 39.0–52.0)
Hemoglobin: 7.7 g/dL — ABNORMAL LOW (ref 13.0–17.0)
MCH: 20.1 pg — ABNORMAL LOW (ref 26.0–34.0)
MCHC: 28.1 g/dL — ABNORMAL LOW (ref 30.0–36.0)
MCV: 71.4 fL — ABNORMAL LOW (ref 80.0–100.0)
Platelets: 550 10*3/uL — ABNORMAL HIGH (ref 150–400)
RBC: 3.84 MIL/uL — ABNORMAL LOW (ref 4.22–5.81)
RDW: 25.5 % — ABNORMAL HIGH (ref 11.5–15.5)
WBC: 9.6 10*3/uL (ref 4.0–10.5)
nRBC: 0 % (ref 0.0–0.2)

## 2021-07-29 LAB — BASIC METABOLIC PANEL
Anion gap: 9 (ref 5–15)
BUN: 14 mg/dL (ref 6–20)
CO2: 26 mmol/L (ref 22–32)
Calcium: 8.8 mg/dL — ABNORMAL LOW (ref 8.9–10.3)
Chloride: 101 mmol/L (ref 98–111)
Creatinine, Ser: 0.81 mg/dL (ref 0.61–1.24)
GFR, Estimated: >60 mL/min (ref >60)
Glucose, Bld: 107 mg/dL — ABNORMAL HIGH (ref 70–99)
Potassium: 4 mmol/L (ref 3.5–5.1)
Sodium: 136 mmol/L (ref 135–145)

## 2021-07-29 LAB — MAGNESIUM: Magnesium: 1.9 mg/dL (ref 1.7–2.4)

## 2021-07-29 MED ORDER — SODIUM CHLORIDE 0.9 % IV SOLN
INTRAVENOUS | Status: DC
Start: 2021-07-29 — End: 2021-08-01

## 2021-07-29 NOTE — TOC Initial Note (Signed)
Transition of Care [facility]) - Initial/Assessment Note   Patient Details  Name: Joseph Haney MRN: [id number] Date of Birth: July 03, 1976  Transition of Care [facility]) CM/SW Contact:    Sherie Don, LCSW Phone Number: 07/29/2021, 12:28 PM  Clinical Narrative: PT evaluation did not recommend any PT follow up, but a rolling walker for home use was recommended. CSW met with patient to discuss DME recommendations. Patient confirmed he does not having a walker at home and will need one. CSW made DME referral to [facility] with [employer]. [employer] to deliver walker to patient's room.  Expected Discharge Plan: Home/Self Care Barriers to Discharge: Continued Medical Work up  Patient Goals and CMS Choice CMS Medicare.gov Compare Post Acute Care list provided to:: Patient Choice offered to / list presented to : Patient  Expected Discharge Plan and Services Expected Discharge Plan: Home/Self Care In-house Referral: Clinical Social Work Post Acute Care Choice: Durable Medical Equipment Living arrangements for the past 2 months: Single Family Home             DME Arranged: Walker rolling DME Agency: [employer] Date DME Agency Contacted: 07/29/21 Time DME Agency Contacted: 1222 Representative spoke with at DME Agency: [place] Arrangements/Services Living arrangements for the past 2 months: [place] Patient language and need for interpreter reviewed:: Yes Do you feel safe going back to the place where you live?: Yes      Care giver support system in place?: Yes (comment) Criminal Activity/Legal Involvement Pertinent to Current Situation/Hospitalization: No - Comment as needed  Activities of Daily Living Home Assistive Devices/Equipment: None ADL Screening (condition at time of admission) Patient's cognitive ability adequate to safely complete daily activities?: Yes Is the patient deaf or have difficulty hearing?: No Does the patient have difficulty seeing, even when wearing  glasses/contacts?: No Does the patient have difficulty concentrating, remembering, or making decisions?: No Patient able to express need for assistance with ADLs?: Yes Does the patient have difficulty dressing or bathing?: No Independently performs ADLs?: Yes (appropriate for developmental age) Does the patient have difficulty walking or climbing stairs?: No Weakness of Legs: None Weakness of Arms/Hands: None  Permission Sought/Granted Permission sought to share information with : Other (comment) Permission granted to share information with : Yes, Verbal Permission Granted Permission granted to share info w AGENCY: DME company  Emotional Assessment Appearance:: Appears stated age Attitude/Demeanor/Rapport: Engaged Affect (typically observed): Accepting Orientation: : Oriented to Self, Oriented to Place, Oriented to  Time, Oriented to Situation Alcohol / Substance Use: Alcohol Use  Admission diagnosis:  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 [place]. [place] [address] Phone:  [phone] Fax: [phone]  Readmission Risk Interventions No flowsheet data found.

## 2021-07-29 NOTE — Progress Notes (Signed)
PROGRESS NOTE    Joseph Haney  SFK:[id number] DOB: 07-03-1976 DOA: 07/19/2021 PCP: Merryl Hacker, No    Brief Narrative:  Joseph Haney is a 45 year old male with past medical history significant for essential hypertension, GERD, seizure disorder, EtOH abuse who presented to [facility] ED on 11/18 with 1 month history of progressive abdominal pain, nausea/vomiting.  Patient has not followed up with her PCP in greater than 2 years since office closed and has not been taking medications since that timeframe.  He has been attempting to diet which consists of eating only mandrin's for the past 2 months.  Patient also has apparently been vomiting without letting any of his family know.  CT abdomen/pelvis notable for focal area of high-grade narrowing proximal ascending colon.  Gastroenterology consulted.  Hospitalist consulted for further evaluation and management of abdominal pain with intractable nausea and vomiting with high-grade narrowing proximal ascending colon.   Assessment & Plan:   Principal Problem:   Colon adenocarcinoma ([place]) Active Problems:   Alcohol abuse   Seizure disorder ([place])   Colonic mass   Iron deficiency anemia due to chronic blood loss   Invasive colorectal adenocarcinoma Postoperative ileus Patient presenting to ED with progressive abdominal pain associate with nausea/vomiting.  CT abdomen/pelvis notable for focal area of high-grade narrowing proximal ascending colon.  Gastroenterology was consulted and patient underwent colonoscopy by Dr. Alessandra Bevels on 11/21 strictured distal ascending colon which was biopsied.  Pathology from colonoscopy consistent with invasive colorectal adenocarcinoma, moderately differentiated.  General surgery was consulted and patient underwent robotic assisted laparoscopic right colectomy by Dr. Rosendo Gros on 07/23/2021. Pathology from colectomy with invasive moderately differentiated colonic adenocarcinoma extending into the pericolonic connective tissue,  metastatic carcinoma and 9 of 19 lymph nodes.  CT chest with no evidence of metastatic disease in the chest. --CEA: 2.9, normal --Diet changed to clear liquids today by general surgery for postoperative ileus --Nucynta 75 mg QID --Oxycodone 15 mg q4h moderate pain --Dilaudid 1 mg IV q3h PRN severe --Oncology arranging outpatient appointment --Encouraged increased mobilization  Essential hypertension --Carvedilol to 25 mg p.o. twice daily --Metoprolol 5mg  IV q6h PRN SBP >180 or DBP >90 and/or sustained HR >120 --Continue monitor blood pressure and adjust accordingly  Seizure disorder Last seizure reported 2017, currently not on medication outpatient. --Keppra 500 mg twice daily  History of alcohol abuse Patient denies any recent alcohol use.  Microcytic anemia Etiology likely secondary to colon cancer as above.  Iron 11, TIBC 470, ferritin 2.  Transfused 2 unit PRBC 11/20 with repeat hemoglobin 8.6>9.8>9.1>8.1 --ferrous sulfate 325mg  PO BID  Leukocytosis WBC 9.4>18.6>18.4>12.9; likely reactive postoperatively. --Monitor fever curve   DVT prophylaxis: SCDs Start: 07/20/21 1716   Code Status: Full Code Family Communication: Updated family present at bedside this morning  Disposition Plan:  Level of care: Med-Surg Status is: Inpatient  Remains inpatient appropriate because: Needs better pain control, advancement of diet and mobilization     Consultants:  [facility], Dr. Mariana Arn - signed off 11/23 General surgery  Procedures:  Colonoscopy, Dr. Alessandra Bevels 11/21 Right robotic colectomy, Dr. Rosendo Gros 11/22  Antimicrobials:  None   Subjective: Patient seen examined at bedside, resting comfortably.  Family present.  Patient reports flatus and 1 small bowel movement yesterday, although now with abdominal distention and only eating small amount of food due to nausea.  Seen by general surgery, Dr. Dema Severin this morning and now changed diet to clear liquids for concern of  postoperative ileus.  No other questions or concerns at this time.  Encouraged increased ambulation. Denies headache, no dizziness, no chest pain, no shortness of breath, no fever/chills/night sweats, no nausea/vomiting/diarrhea, no weakness, no fatigue, no paresthesias.  No acute events overnight per nursing staff.  Objective: Vitals:   07/28/21 0531 07/28/21 1335 07/28/21 2154 07/29/21 0628  BP: 112/74 (!) 144/86 103/72 122/83  Pulse: 97 100 99 95  Resp: 17 17    Temp: 98.7 F (37.1 C) 98.7 F (37.1 C) 98.5 F (36.9 C) 98 F (36.7 C)  TempSrc: Oral  Oral Oral  SpO2: 97% 100% 97% 96%  Weight:      Height:        Intake/Output Summary (Last 24 hours) at 07/29/2021 1110 Last data filed at 07/29/2021 0600 Gross per 24 hour  Intake 1200 ml  Output 1300 ml  Net -100 ml   Filed Weights   07/19/21 1942 07/22/21 1209  Weight: 77.1 kg 77.1 kg    Examination:  General exam: Appears calm and comfortable  Respiratory system: Clear to auscultation. Respiratory effort normal.  On room air Cardiovascular system: S1 & S2 heard, RRR. No JVD, murmurs, rubs, gallops or clicks. No pedal edema. Gastrointestinal system: Abdomen soft, + distention, mild tenderness to palpation, no rebound/guarding. No organomegaly or masses felt. faint bowel sounds heard.  Surgical incision noted, clean/dry/intact Central nervous system: Alert and oriented. No focal neurological deficits. Extremities: Symmetric 5 x 5 power. Skin: No rashes, lesions or ulcers Psychiatry: Judgement and insight appear normal. Mood & affect appropriate.     Data Reviewed: I have personally reviewed following labs and imaging studies  CBC: Recent Labs  Lab 07/23/21 0326 07/24/21 0757 07/25/21 0337 07/27/21 0323 07/29/21 0737  WBC 9.4 18.6* 18.4* 12.9* 9.6  HGB 9.8* 9.8* 9.1* 8.1* 7.7*  HCT 34.1* 33.0* 31.3* 28.5* 27.4*  MCV 69.2* 68.2* 69.9* 71.1* 71.4*  PLT 592* 579* 622* 508* 983*   Basic Metabolic Panel: Recent  Labs  Lab 07/23/21 0326 07/24/21 0757 07/27/21 0323 07/29/21 0737  NA 136 136 134* 136  K 3.5 3.8 3.4* 4.0  CL 102 99 95* 101  CO2 26 22 27 26   GLUCOSE 98 100* 104* 107*  BUN 9 11 25* 14  CREATININE 0.89 0.76 0.92 0.81  CALCIUM 9.4 9.0 8.7* 8.8*  MG  --  2.2  --  1.9   GFR: Estimated Creatinine Clearance: 112.7 mL/min (by C-G formula based on SCr of 0.81 mg/dL). Liver Function Tests: No results for input(s): AST, ALT, ALKPHOS, BILITOT, PROT, ALBUMIN in the last 168 hours.  No results for input(s): LIPASE, AMYLASE in the last 168 hours.  No results for input(s): AMMONIA in the last 168 hours. Coagulation Profile: No results for input(s): INR, PROTIME in the last 168 hours. Cardiac Enzymes: No results for input(s): CKTOTAL, CKMB, CKMBINDEX, TROPONINI in the last 168 hours. BNP (last 3 results) No results for input(s): PROBNP in the last 8760 hours. HbA1C: No results for input(s): HGBA1C in the last 72 hours. CBG: No results for input(s): GLUCAP in the last 168 hours. Lipid Profile: No results for input(s): CHOL, HDL, LDLCALC, TRIG, CHOLHDL, LDLDIRECT in the last 72 hours. Thyroid Function Tests: No results for input(s): TSH, T4TOTAL, FREET4, T3FREE, THYROIDAB in the last 72 hours. Anemia Panel: No results for input(s): VITAMINB12, FOLATE, FERRITIN, TIBC, IRON, RETICCTPCT in the last 72 hours. Sepsis Labs: No results for input(s): PROCALCITON, LATICACIDVEN in the last 168 hours.  Recent Results (from the past 240 hour(s))  Resp Panel by RT-PCR (Flu A&B, Covid) Nasopharyngeal  Swab     Status: None   Collection Time: 07/19/21 10:59 PM   Specimen: Nasopharyngeal Swab; Nasopharyngeal(NP) swabs in vial transport medium  Result Value Ref Range Status   SARS Coronavirus 2 by RT PCR NEGATIVE NEGATIVE Final    Comment: (NOTE) SARS-CoV-2 target nucleic acids are NOT DETECTED.  The SARS-CoV-2 RNA is generally detectable in upper respiratory specimens during the acute phase of  infection. The lowest concentration of SARS-CoV-2 viral copies this assay can detect is 138 copies/mL. A negative result does not preclude SARS-Cov-2 infection and should not be used as the sole basis for treatment or other patient management decisions. A negative result may occur with  improper specimen collection/handling, submission of specimen other than nasopharyngeal swab, presence of viral mutation(s) within the areas targeted by this assay, and inadequate number of viral copies(<138 copies/mL). A negative result must be combined with clinical observations, patient history, and epidemiological information. The expected result is Negative.  Fact Sheet for Patients:  EntrepreneurPulse.com.au  Fact Sheet for Healthcare Providers:  IncredibleEmployment.be  This test is no t yet approved or cleared by the [place] FDA and  has been authorized for detection and/or diagnosis of SARS-CoV-2 by FDA under an Emergency Use Authorization (EUA). This EUA will remain  in effect (meaning this test can be used) for the duration of the COVID-19 declaration under Section 564(b)(1) of the Act, 21 U.S.C.section 360bbb-3(b)(1), unless the authorization is terminated  or revoked sooner.       Influenza A by PCR NEGATIVE NEGATIVE Final   Influenza B by PCR NEGATIVE NEGATIVE Final    Comment: (NOTE) The Xpert Xpress SARS-CoV-2/FLU/RSV plus assay is intended as an aid in the diagnosis of influenza from Nasopharyngeal swab specimens and should not be used as a sole basis for treatment. Nasal washings and aspirates are unacceptable for Xpert Xpress SARS-CoV-2/FLU/RSV testing.  Fact Sheet for Patients: EntrepreneurPulse.com.au  Fact Sheet for Healthcare Providers: IncredibleEmployment.be  This test is not yet approved or cleared by the [place] FDA and has been authorized for detection and/or diagnosis of SARS-CoV-2  by FDA under an Emergency Use Authorization (EUA). This EUA will remain in effect (meaning this test can be used) for the duration of the COVID-19 declaration under Section 564(b)(1) of the Act, 21 U.S.C. section 360bbb-3(b)(1), unless the authorization is terminated or revoked.  Performed at [facility], [address] [place], [place] [address]   Surgical pcr screen     Status: None   Collection Time: 07/23/21  6:07 AM   Specimen: Nasal Mucosa; Nasal Swab  Result Value Ref Range Status   MRSA, PCR NEGATIVE NEGATIVE Final   Staphylococcus aureus NEGATIVE NEGATIVE Final    Comment: (NOTE) The Xpert SA Assay (FDA approved for NASAL specimens in patients 44 years of age and older), is one component of a comprehensive surveillance program. It is not intended to diagnose infection nor to guide or monitor treatment. Performed at [facility], [place] [address] [place], [place] [address]          Radiology Studies: CT CHEST W CONTRAST  Result Date: 07/28/2021 CLINICAL DATA:  Staging for colorectal carcinoma. EXAM: CT CHEST WITH CONTRAST TECHNIQUE: Multidetector CT imaging of the chest was performed during intravenous contrast administration. CONTRAST:  3mL OMNIPAQUE IOHEXOL 350 MG/ML SOLN COMPARISON:  CT abdomen and pelvis, 07/19/2021. FINDINGS: Cardiovascular: Heart normal in size. No pericardial effusion. Left coronary artery calcifications. Normal great vessels. Mediastinum/Nodes: No neck base, mediastinal or  hilar masses or enlarged lymph nodes. Trachea and esophagus are unremarkable. Lungs/Pleura: Dependent opacity noted both lower lobes, at the posterior base of the left upper lobe lingula, and diaphragmatic base of the right middle lobe. Minor linear opacity noted in the posterior right upper lobe. These findings are all consistent with atelectasis. Remainder of the lungs is clear. Specifically, no lung mass or nodule. No pleural effusion or  pneumothorax. Upper Abdomen: No acute abnormality. Musculoskeletal: No fracture or acute finding. No bone lesion. No chest wall masses. IMPRESSION: 1. No evidence of metastatic disease within the chest. 2. Lungs demonstrate atelectasis, most evident lower lobes and bases of the left upper lobe lingula and right middle lobe. No convincing pneumonia and no evidence of pulmonary edema. Electronically Signed   By: Lajean Manes M.D.   On: 07/28/2021 15:22        Scheduled Meds:  acetaminophen  1,000 mg Oral TID   carvedilol  25 mg Oral BID WC   cyclobenzaprine  10 mg Oral BID   docusate sodium  100 mg Oral BID   feeding supplement  237 mL Oral BID BM   ferrous sulfate  325 mg Oral BID WC   gabapentin  300 mg Oral TID   tapentadol HCl  75 mg Oral QID   Continuous Infusions:  sodium chloride       LOS: 9 days    Time spent: 37 minutes spent on chart review, discussion with nursing staff, consultants, updating family and interview/physical exam; more than 50% of that time was spent in counseling and/or coordination of care.    Adriene Knipfer J [place] DO [facility] Available via Epic secure chat 7am-7pm After these hours, please refer to coverage provider listed on [email or web address] 07/29/2021, 11:10 AM

## 2021-07-29 NOTE — Progress Notes (Addendum)
6 Days Post-Op   Subjective/Chief Complaint: Has had 1 bm, some flatus. Nausea; distention after he tries 2 bites of food. Not hungry.   Objective: Vital signs in last 24 hours: Temp:  [98 F (36.7 C)-98.7 F (37.1 C)] 98 F (36.7 C) (11/28 0628) Pulse Rate:  [95-100] 95 (11/28 0628) Resp:  [17] 17 (11/27 1335) BP: (103-144)/(72-86) 122/83 (11/28 0628) SpO2:  [96 %-100 %] 96 % (11/28 0628) Last BM Date: 07/27/21  Intake/Output from previous day: 11/27 0701 - 11/28 0700 In: 1680 [P.O.:1680] Out: 1300 [Urine:1300] Intake/Output this shift: No intake/output data recorded.    Gen:  Alert, NAD, pleasant Card:  RRR Pulm:  rate and effort normal Abd: Soft, mild distension, mild incisional tenderness to palpation, +BS, incisions cdi    Lab Results:  Recent Labs    07/27/21 0323 07/29/21 0737  WBC 12.9* 9.6  HGB 8.1* 7.7*  HCT 28.5* 27.4*  PLT 508* 550*   BMET Recent Labs    07/27/21 0323 07/29/21 0737  NA 134* 136  K 3.4* 4.0  CL 95* 101  CO2 27 26  GLUCOSE 104* 107*  BUN 25* 14  CREATININE 0.92 0.81  CALCIUM 8.7* 8.8*   PT/INR No results for input(s): LABPROT, INR in the last 72 hours. ABG No results for input(s): PHART, HCO3 in the last 72 hours.  Invalid input(s): PCO2, PO2  Studies/Results: CT CHEST W CONTRAST  Result Date: 07/28/2021 CLINICAL DATA:  Staging for colorectal carcinoma. EXAM: CT CHEST WITH CONTRAST TECHNIQUE: Multidetector CT imaging of the chest was performed during intravenous contrast administration. CONTRAST:  77mL OMNIPAQUE IOHEXOL 350 MG/ML SOLN COMPARISON:  CT abdomen and pelvis, 07/19/2021. FINDINGS: Cardiovascular: Heart normal in size. No pericardial effusion. Left coronary artery calcifications. Normal great vessels. Mediastinum/Nodes: No neck base, mediastinal or hilar masses or enlarged lymph nodes. Trachea and esophagus are unremarkable. Lungs/Pleura: Dependent opacity noted both lower lobes, at the posterior base of the left  upper lobe lingula, and diaphragmatic base of the right middle lobe. Minor linear opacity noted in the posterior right upper lobe. These findings are all consistent with atelectasis. Remainder of the lungs is clear. Specifically, no lung mass or nodule. No pleural effusion or pneumothorax. Upper Abdomen: No acute abnormality. Musculoskeletal: No fracture or acute finding. No bone lesion. No chest wall masses. IMPRESSION: 1. No evidence of metastatic disease within the chest. 2. Lungs demonstrate atelectasis, most evident lower lobes and bases of the left upper lobe lingula and right middle lobe. No convincing pneumonia and no evidence of pulmonary edema. Electronically Signed   By: Lajean Manes M.D.   On: 07/28/2021 15:22    Anti-infectives: Anti-infectives (From admission, onward)    Start     Dose/Rate Route Frequency Ordered Stop   07/23/21 1030  cefoTEtan (CEFOTAN) 2 g in sodium chloride 0.9 % 100 mL IVPB        2 g 200 mL/hr over 30 Minutes Intravenous On call to O.R. 07/22/21 1628 07/23/21 1121       Assessment/Plan: Right colon mass POD#5 S/p XI ROBOT ASSISTED LAPAROSCOPIC RIGHT COLECTOMY  11/22 Dr. 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  Discussed with patient -Primary team contacting oncology for outpatient appointment - CEA 2.9 -Staging chest CT scan showed no evident metastatic disease - Reorder home pain medications (Nucynta, flexeril, gabapentin). Increase Oxycodone IR to 15 mg PO q4 PRN.  Schedule tylenol - Mobilize, encourage ambulation;  - Clear liquid diet  ID - cefotetan periop FEN - CLD VTE - SCDs, ok for chemical dvt prophylaxis from surgical standpoint Foley - none   HTN GERD Seizure d/o - last seizure 2017, not on medication Remote h/o alcohol abuse - 2017 Microcytic anemia Chronic pain    Clear liquids today, will see how he feels tomorrow; not ready for discharge at this juncture - postoperative  ileus. Afebrile, normal wbc.  LOS: 9 days    Sharon Mt [facility] 07/29/2021

## 2021-07-30 ENCOUNTER — Inpatient Hospital Stay (HOSPITAL_COMMUNITY): Payer: Medicaid Other

## 2021-07-30 DIAGNOSIS — C189 Malignant neoplasm of colon, unspecified: Secondary | ICD-10-CM | POA: Diagnosis not present

## 2021-07-30 LAB — BASIC METABOLIC PANEL
Anion gap: 7 (ref 5–15)
BUN: 10 mg/dL (ref 6–20)
CO2: 24 mmol/L (ref 22–32)
Calcium: 8.4 mg/dL — ABNORMAL LOW (ref 8.9–10.3)
Chloride: 104 mmol/L (ref 98–111)
Creatinine, Ser: 0.77 mg/dL (ref 0.61–1.24)
GFR, Estimated: >60 mL/min (ref >60)
Glucose, Bld: 92 mg/dL (ref 70–99)
Potassium: 4 mmol/L (ref 3.5–5.1)
Sodium: 135 mmol/L (ref 135–145)

## 2021-07-30 LAB — CBC
HCT: 25.8 % — ABNORMAL LOW (ref 39.0–52.0)
Hemoglobin: 7.3 g/dL — ABNORMAL LOW (ref 13.0–17.0)
MCH: 20.2 pg — ABNORMAL LOW (ref 26.0–34.0)
MCHC: 28.3 g/dL — ABNORMAL LOW (ref 30.0–36.0)
MCV: 71.3 fL — ABNORMAL LOW (ref 80.0–100.0)
Platelets: 542 10*3/uL — ABNORMAL HIGH (ref 150–400)
RBC: 3.62 MIL/uL — ABNORMAL LOW (ref 4.22–5.81)
RDW: 25.6 % — ABNORMAL HIGH (ref 11.5–15.5)
WBC: 8.9 10*3/uL (ref 4.0–10.5)
nRBC: 0 % (ref 0.0–0.2)

## 2021-07-30 LAB — MAGNESIUM: Magnesium: 2 mg/dL (ref 1.7–2.4)

## 2021-07-30 LAB — SURGICAL PATHOLOGY

## 2021-07-30 MED ORDER — DIATRIZOATE MEGLUMINE & SODIUM 66-10 % PO SOLN
15.0000 mL | ORAL | Status: AC
  Administered 2021-07-30 (×2): 15 mL via ORAL
  Filled 2021-07-30: qty 30

## 2021-07-30 MED ORDER — IOHEXOL 350 MG/ML SOLN
80.0000 mL | Freq: Once | INTRAVENOUS | Status: AC | PRN
  Administered 2021-07-30: 80 mL via INTRAVENOUS

## 2021-07-30 MED ORDER — SODIUM CHLORIDE (PF) 0.9 % IJ SOLN
INTRAMUSCULAR | Status: AC
  Filled 2021-07-30: qty 50

## 2021-07-30 NOTE — Progress Notes (Signed)
7 Days Post-Op   Subjective/Chief Complaint: No further bm, still distended. No emesis. Tolerating some liquids.    Objective: Vital signs in last 24 hours: Temp:  [97.5 F (36.4 C)-98.1 F (36.7 C)] 98.1 F (36.7 C) (11/29 0548) Pulse Rate:  [86-92] 86 (11/29 0548) Resp:  [16-20] 20 (11/29 0548) BP: (117-130)/(73-84) 120/80 (11/29 0548) SpO2:  [98 %-99 %] 99 % (11/29 0548) Last BM Date: 07/27/21  Intake/Output from previous day: 11/28 0701 - 11/29 0700 In: 3450 [P.O.:1800; I.V.:1650] Out: 1330 [Urine:1330] Intake/Output this shift: No intake/output data recorded.    Gen:  Alert, NAD, pleasant Card:  RRR Pulm:  rate and effort normal Abd: Soft, mild distension, mild incisional tenderness to palpation, +BS, incisions cdi    Lab Results:  Recent Labs    07/29/21 0737 07/30/21 0714  WBC 9.6 8.9  HGB 7.7* 7.3*  HCT 27.4* 25.8*  PLT 550* 542*   BMET Recent Labs    07/29/21 0737 07/30/21 0714  NA 136 135  K 4.0 4.0  CL 101 104  CO2 26 24  GLUCOSE 107* 92  BUN 14 10  CREATININE 0.81 0.77  CALCIUM 8.8* 8.4*   PT/INR No results for input(s): LABPROT, INR in the last 72 hours. ABG No results for input(s): PHART, HCO3 in the last 72 hours.  Invalid input(s): PCO2, PO2  Studies/Results: CT CHEST W CONTRAST  Result Date: 07/28/2021 CLINICAL DATA:  Staging for colorectal carcinoma. EXAM: CT CHEST WITH CONTRAST TECHNIQUE: Multidetector CT imaging of the chest was performed during intravenous contrast administration. CONTRAST:  68mL OMNIPAQUE IOHEXOL 350 MG/ML SOLN COMPARISON:  CT abdomen and pelvis, 07/19/2021. FINDINGS: Cardiovascular: Heart normal in size. No pericardial effusion. Left coronary artery calcifications. Normal great vessels. Mediastinum/Nodes: No neck base, mediastinal or hilar masses or enlarged lymph nodes. Trachea and esophagus are unremarkable. Lungs/Pleura: Dependent opacity noted both lower lobes, at the posterior base of the left upper lobe  lingula, and diaphragmatic base of the right middle lobe. Minor linear opacity noted in the posterior right upper lobe. These findings are all consistent with atelectasis. Remainder of the lungs is clear. Specifically, no lung mass or nodule. No pleural effusion or pneumothorax. Upper Abdomen: No acute abnormality. Musculoskeletal: No fracture or acute finding. No bone lesion. No chest wall masses. IMPRESSION: 1. No evidence of metastatic disease within the chest. 2. Lungs demonstrate atelectasis, most evident lower lobes and bases of the left upper lobe lingula and right middle lobe. No convincing pneumonia and no evidence of pulmonary edema. Electronically Signed   By: Lajean Manes M.D.   On: 07/28/2021 15:22    Anti-infectives: Anti-infectives (From admission, onward)    Start     Dose/Rate Route Frequency Ordered Stop   07/23/21 1030  cefoTEtan (CEFOTAN) 2 g in sodium chloride 0.9 % 100 mL IVPB        2 g 200 mL/hr over 30 Minutes Intravenous On call to O.R. 07/22/21 1628 07/23/21 1121       Assessment/Plan: Right colon mass POD#6 S/p XI ROBOT ASSISTED LAPAROSCOPIC RIGHT COLECTOMY  11/22 Dr. 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  Discussed with patient -Primary team contacting oncology for outpatient appointment - CEA 2.9 -Staging chest CT scan showed no evident metastatic disease - Reorder home pain medications (Nucynta, flexeril, gabapentin). Increase Oxycodone IR to 15 mg PO q4 PRN.  Schedule tylenol - Mobilize, encourage ambulation - Clear liquid diet - Postop ileus, now prolonged, will check CT  A/P with PO & IV contrast today   ID - cefotetan periop FEN - CLD VTE - SCDs, ok for chemical dvt prophylaxis from surgical standpoint Foley - none   HTN GERD Seizure d/o - last seizure 2017, not on medication Remote h/o alcohol abuse - 2017 Microcytic anemia Chronic pain    Clear liquids today, CT A/P   LOS: 10 days     Ileana Roup 07/30/2021

## 2021-07-30 NOTE — Progress Notes (Signed)
PROGRESS NOTE    Joseph Haney  KGU:[id number] DOB: 02-09-1976 DOA: 07/19/2021 PCP: Merryl Hacker, No    Brief Narrative:  Joseph Haney is a 45 year old male with past medical history significant for essential hypertension, GERD, seizure disorder, EtOH abuse who presented to [facility] ED on 11/18 with 1 month history of progressive abdominal pain, nausea/vomiting.  Patient has not followed up with her PCP in greater than 2 years since office closed and has not been taking medications since that timeframe.  He has been attempting to diet which consists of eating only mandrin's for the past 2 months.  Patient also has apparently been vomiting without letting any of his family know.  CT abdomen/pelvis notable for focal area of high-grade narrowing proximal ascending colon.  Gastroenterology consulted.  Hospitalist consulted for further evaluation and management of abdominal pain with intractable nausea and vomiting with high-grade narrowing proximal ascending colon.   Assessment & Plan:   Principal Problem:   Colon adenocarcinoma ([place]) Active Problems:   Alcohol abuse   Seizure disorder ([place])   Colonic mass   Iron deficiency anemia due to chronic blood loss   Invasive colorectal adenocarcinoma Postoperative ileus, prolonged Patient presenting to ED with progressive abdominal pain associate with nausea/vomiting.  CT abdomen/pelvis notable for focal area of high-grade narrowing proximal ascending colon.  Gastroenterology was consulted and patient underwent colonoscopy by Dr. Alessandra Bevels on 11/21 strictured distal ascending colon which was biopsied.  Pathology from colonoscopy consistent with invasive colorectal adenocarcinoma, moderately differentiated.  General surgery was consulted and patient underwent robotic assisted laparoscopic right colectomy by Dr. Rosendo Gros on 07/23/2021. Pathology from colectomy with invasive moderately differentiated colonic adenocarcinoma extending into the pericolonic connective  tissue, metastatic carcinoma and 9 of 19 lymph nodes.  CT chest with no evidence of metastatic disease in the chest. --CEA: 2.9, normal --Clear liquid diet --Nucynta 75 mg QID --Oxycodone 15 mg q4h moderate pain --Dilaudid 1 mg IV q3h PRN severe --Oncology arranging outpatient appointment --Encouraged increased mobilization --General surgery obtaining repeat CT abdomen/pelvis with p.o./IV contrast for prolonged postoperative ileus  Essential hypertension --Carvedilol 25 mg p.o. twice daily --Metoprolol 5mg  IV q6h PRN SBP >180 or DBP >90 and/or sustained HR >120 --Continue monitor blood pressure and adjust accordingly  Seizure disorder Last seizure reported 2017, currently not on medication outpatient. --Keppra 500 mg twice daily  History of alcohol abuse Patient denies any recent alcohol use.  Microcytic anemia Etiology likely secondary to colon cancer as above.  Iron 11, TIBC 470, ferritin 2.  Transfused 2 unit PRBC 11/20 with repeat hemoglobin 8.6>9.8>9.1>8.1 --ferrous sulfate 325mg  PO BID  Leukocytosis: resolved WBC 9.4>18.6>18.4>12.9>8.9; likely reactive postoperatively. --Monitor fever curve   DVT prophylaxis: SCDs Start: 07/20/21 1716   Code Status: Full Code Family Communication: Updated family present at bedside this morning  Disposition Plan:  Level of care: Med-Surg Status is: Inpatient  Remains inpatient appropriate because: Needs better pain control, advancement of diet and mobilization     Consultants:  [facility], Dr. Mariana Arn - signed off 11/23 General surgery  Procedures:  Colonoscopy, Dr. Alessandra Bevels 11/21 Right robotic colectomy, Dr. Rosendo Gros 11/22  Antimicrobials:  None   Subjective: Patient seen examined at bedside, resting comfortably.  Patient reports flatus, continues with abdominal distention.  Is tolerating some clear liquids.  Did attempt to ambulate more yesterday.  No further bowel movement.  Seen by general surgery this morning and  discussed with Dr. Dema Severin, plan for repeat CT abdomen/pelvis with oral and IV contrast for prolonged postoperative ileus.  No  other questions or concerns at this time.  Encouraged increased ambulation. Denies headache, no dizziness, no chest pain, no shortness of breath, no fever/chills/night sweats, no nausea/vomiting/diarrhea, no weakness, no fatigue, no paresthesias.  No acute events overnight per nursing staff.  Objective: Vitals:   07/29/21 0628 07/29/21 1300 07/29/21 2106 07/30/21 0548  BP: 122/83 117/73 130/84 120/80  Pulse: 95 92 91 86  Resp:  16 20 20   Temp: 98 F (36.7 C) (!) 97.5 F (36.4 C) 98.1 F (36.7 C) 98.1 F (36.7 C)  TempSrc: Oral Oral Oral Oral  SpO2: 96% 98% 99% 99%  Weight:      Height:        Intake/Output Summary (Last 24 hours) at 07/30/2021 1044 Last data filed at 07/30/2021 1010 Gross per 24 hour  Intake 3249.76 ml  Output 1030 ml  Net 2219.76 ml   Filed Weights   07/19/21 1942 07/22/21 1209  Weight: 77.1 kg 77.1 kg    Examination:  General exam: Appears calm and comfortable  Respiratory system: Clear to auscultation. Respiratory effort normal.  On room air Cardiovascular system: S1 & S2 heard, RRR. No JVD, murmurs, rubs, gallops or clicks. No pedal edema. Gastrointestinal system: Abdomen soft, + distention, mild tenderness to palpation, no rebound/guarding. No organomegaly or masses felt. Faint bowel sounds heard.  Surgical incision noted, clean/dry/intact Central nervous system: Alert and oriented. No focal neurological deficits. Extremities: Symmetric 5 x 5 power. Skin: No rashes, lesions or ulcers Psychiatry: Judgement and insight appear normal. Mood & affect appropriate.     Data Reviewed: I have personally reviewed following labs and imaging studies  CBC: Recent Labs  Lab 07/24/21 0757 07/25/21 0337 07/27/21 0323 07/29/21 0737 07/30/21 0714  WBC 18.6* 18.4* 12.9* 9.6 8.9  HGB 9.8* 9.1* 8.1* 7.7* 7.3*  HCT 33.0* 31.3* 28.5* 27.4*  25.8*  MCV 68.2* 69.9* 71.1* 71.4* 71.3*  PLT 579* 622* 508* 550* 474*   Basic Metabolic Panel: Recent Labs  Lab 07/24/21 0757 07/27/21 0323 07/29/21 0737 07/30/21 0714  NA 136 134* 136 135  K 3.8 3.4* 4.0 4.0  CL 99 95* 101 104  CO2 22 27 26 24   GLUCOSE 100* 104* 107* 92  BUN 11 25* 14 10  CREATININE 0.76 0.92 0.81 0.77  CALCIUM 9.0 8.7* 8.8* 8.4*  MG 2.2  --  1.9 2.0   GFR: Estimated Creatinine Clearance: 114.2 mL/min (by C-G formula based on SCr of 0.77 mg/dL). Liver Function Tests: No results for input(s): AST, ALT, ALKPHOS, BILITOT, PROT, ALBUMIN in the last 168 hours.  No results for input(s): LIPASE, AMYLASE in the last 168 hours.  No results for input(s): AMMONIA in the last 168 hours. Coagulation Profile: No results for input(s): INR, PROTIME in the last 168 hours. Cardiac Enzymes: No results for input(s): CKTOTAL, CKMB, CKMBINDEX, TROPONINI in the last 168 hours. BNP (last 3 results) No results for input(s): PROBNP in the last 8760 hours. HbA1C: No results for input(s): HGBA1C in the last 72 hours. CBG: No results for input(s): GLUCAP in the last 168 hours. Lipid Profile: No results for input(s): CHOL, HDL, LDLCALC, TRIG, CHOLHDL, LDLDIRECT in the last 72 hours. Thyroid Function Tests: No results for input(s): TSH, T4TOTAL, FREET4, T3FREE, THYROIDAB in the last 72 hours. Anemia Panel: No results for input(s): VITAMINB12, FOLATE, FERRITIN, TIBC, IRON, RETICCTPCT in the last 72 hours. Sepsis Labs: No results for input(s): PROCALCITON, LATICACIDVEN in the last 168 hours.  Recent Results (from the past 240 hour(s))  Surgical pcr  screen     Status: None   Collection Time: 07/23/21  6:07 AM   Specimen: Nasal Mucosa; Nasal Swab  Result Value Ref Range Status   MRSA, PCR NEGATIVE NEGATIVE Final   Staphylococcus aureus NEGATIVE NEGATIVE Final    Comment: (NOTE) The Xpert SA Assay (FDA approved for NASAL specimens in patients 63 years of age and older), is one  component of a comprehensive surveillance program. It is not intended to diagnose infection nor to guide or monitor treatment. Performed at [facility], [place] [address] [place], [place] [address]          Radiology Studies: CT CHEST W CONTRAST  Result Date: 07/28/2021 CLINICAL DATA:  Staging for colorectal carcinoma. EXAM: CT CHEST WITH CONTRAST TECHNIQUE: Multidetector CT imaging of the chest was performed during intravenous contrast administration. CONTRAST:  95mL OMNIPAQUE IOHEXOL 350 MG/ML SOLN COMPARISON:  CT abdomen and pelvis, 07/19/2021. FINDINGS: Cardiovascular: Heart normal in size. No pericardial effusion. Left coronary artery calcifications. Normal great vessels. Mediastinum/Nodes: No neck base, mediastinal or hilar masses or enlarged lymph nodes. Trachea and esophagus are unremarkable. Lungs/Pleura: Dependent opacity noted both lower lobes, at the posterior base of the left upper lobe lingula, and diaphragmatic base of the right middle lobe. Minor linear opacity noted in the posterior right upper lobe. These findings are all consistent with atelectasis. Remainder of the lungs is clear. Specifically, no lung mass or nodule. No pleural effusion or pneumothorax. Upper Abdomen: No acute abnormality. Musculoskeletal: No fracture or acute finding. No bone lesion. No chest wall masses. IMPRESSION: 1. No evidence of metastatic disease within the chest. 2. Lungs demonstrate atelectasis, most evident lower lobes and bases of the left upper lobe lingula and right middle lobe. No convincing pneumonia and no evidence of pulmonary edema. Electronically Signed   By: Lajean Manes M.D.   On: 07/28/2021 15:22        Scheduled Meds:  acetaminophen  1,000 mg Oral TID   carvedilol  25 mg Oral BID WC   cyclobenzaprine  10 mg Oral BID   diatrizoate meglumine-sodium  15 mL Oral Q1 Hr x 2   docusate sodium  100 mg Oral BID   feeding supplement  237 mL Oral BID BM   ferrous  sulfate  325 mg Oral BID WC   gabapentin  300 mg Oral TID   tapentadol HCl  75 mg Oral QID   Continuous Infusions:  sodium chloride 75 mL/hr at 07/30/21 1010     LOS: 10 days    Time spent: 37 minutes spent on chart review, discussion with nursing staff, consultants, updating family and interview/physical exam; more than 50% of that time was spent in counseling and/or coordination of care.    Milessa Hogan J [place] DO [facility] Available via Epic secure chat 7am-7pm After these hours, please refer to coverage provider listed on [email or web address] 07/30/2021, 10:44 AM

## 2021-07-30 NOTE — Plan of Care (Signed)
  Problem: Activity: Goal: Risk for activity intolerance will decrease 07/30/2021 0753 by Mayme Genta, RN Outcome: Progressing 07/30/2021 0753 by Mayme Genta, RN Outcome: Progressing   Problem: Safety: Goal: Ability to remain free from injury will improve Outcome: Progressing   Problem: Pain Managment: Goal: General experience of comfort will improve Outcome: Progressing

## 2021-07-31 DIAGNOSIS — F1010 Alcohol abuse, uncomplicated: Secondary | ICD-10-CM | POA: Diagnosis not present

## 2021-07-31 DIAGNOSIS — C189 Malignant neoplasm of colon, unspecified: Secondary | ICD-10-CM | POA: Diagnosis not present

## 2021-07-31 DIAGNOSIS — D500 Iron deficiency anemia secondary to blood loss (chronic): Secondary | ICD-10-CM | POA: Diagnosis not present

## 2021-07-31 DIAGNOSIS — K6389 Other specified diseases of intestine: Secondary | ICD-10-CM | POA: Diagnosis not present

## 2021-07-31 DIAGNOSIS — G40909 Epilepsy, unspecified, not intractable, without status epilepticus: Secondary | ICD-10-CM

## 2021-07-31 LAB — BASIC METABOLIC PANEL
Anion gap: 9 (ref 5–15)
BUN: 9 mg/dL (ref 6–20)
CO2: 22 mmol/L (ref 22–32)
Calcium: 8.4 mg/dL — ABNORMAL LOW (ref 8.9–10.3)
Chloride: 101 mmol/L (ref 98–111)
Creatinine, Ser: 0.75 mg/dL (ref 0.61–1.24)
GFR, Estimated: >60 mL/min (ref >60)
Glucose, Bld: 87 mg/dL (ref 70–99)
Potassium: 3.9 mmol/L (ref 3.5–5.1)
Sodium: 132 mmol/L — ABNORMAL LOW (ref 135–145)

## 2021-07-31 LAB — CBC
HCT: 27.2 % — ABNORMAL LOW (ref 39.0–52.0)
Hemoglobin: 7.9 g/dL — ABNORMAL LOW (ref 13.0–17.0)
MCH: 20.8 pg — ABNORMAL LOW (ref 26.0–34.0)
MCHC: 29 g/dL — ABNORMAL LOW (ref 30.0–36.0)
MCV: 71.8 fL — ABNORMAL LOW (ref 80.0–100.0)
Platelets: 636 10*3/uL — ABNORMAL HIGH (ref 150–400)
RBC: 3.79 MIL/uL — ABNORMAL LOW (ref 4.22–5.81)
RDW: 25.5 % — ABNORMAL HIGH (ref 11.5–15.5)
WBC: 11.9 10*3/uL — ABNORMAL HIGH (ref 4.0–10.5)
nRBC: 0 % (ref 0.0–0.2)

## 2021-07-31 NOTE — Progress Notes (Signed)
PROGRESS NOTE    Joseph Haney  ERX:[id number] DOB: 25-Jun-1976 DOA: 07/19/2021 PCP: Merryl Hacker, No    Brief Narrative:   Joseph Haney is a 45 year old male with past medical history significant for essential hypertension, GERD, seizure disorder, EtOH abuse who presented to [facility] ED on 11/18 with 1 month history of progressive abdominal pain, nausea/vomiting.  Patient has not followed up with her PCP in greater than 2 years since office closed and has not been taking medications since that timeframe.  He has been attempting to diet which consists of eating only mandrin's for the past 2 months.  Patient also has apparently been vomiting without letting any of his family know.  CT abdomen/pelvis notable for focal area of high-grade narrowing proximal ascending colon.  Gastroenterology consulted.  Hospitalist consulted for further evaluation and management of abdominal pain with intractable nausea and vomiting with high-grade narrowing proximal ascending colon.    Assessment & Plan:   Principal Problem:   Colon adenocarcinoma ([place]) Active Problems:   Alcohol abuse   Seizure disorder ([place])   Colonic mass   Iron deficiency anemia due to chronic blood loss   Invasive colorectal adenocarcinoma status post robotic surgery followed by Postoperative ileus, prolonged  Pathology from colonoscopy consistent with invasive colorectal adenocarcinoma, moderately differentiated.  General surgery was consulted and patient underwent robotic assisted laparoscopic right colectomy by Dr. Rosendo Gros on 07/23/2021. Pathology from colectomy with invasive moderately differentiated colonic adenocarcinoma extending into the pericolonic connective tissue, metastatic carcinoma and 9 of 19 lymph nodes.  CTT chest without any metastatic disease.  Currently on pain medication regimen.  Repeat CT scan was performed on 07/30/2021 with findings of mildly dilated small and large bowel with no transition point thought to be secondary to  ileus.  Patient has been improving.  Diet has been advanced to full liquids today.  Essential hypertension Continue Coreg, metoprolol as needed.    Seizure disorder Last seizure reported 2017, currently not on medication outpatient.   History of alcohol abuse No withdrawal symptoms.  Dementia stable at this time.  Microcytic anemia Likely secondary to colon cancer.  Received 2 units of packed RBC.  We will continue on iron sulfate.  Latest hemoglobin of 7.9.  Leukocytosis: resolved Likely reactive.   DVT prophylaxis: SCDs Start: 07/20/21 1716    Code Status: Full Code  Family Communication:  Spoke with the patient's wife at bedside   Disposition Plan: Home likely by tomorrow  Level of care: Med-Surg  Status is: Inpatient  Remains inpatient appropriate because: Ileus follow-up, advancing diet.  Consultants:  [facility], Dr. Mariana Arn - signed off 11/23 General surgery  Procedures:  Colonoscopy, Dr. Alessandra Bevels 11/21 Right robotic colectomy, Dr. Rosendo Gros 11/22  Antimicrobials:  None   Subjective: Today, patient was seen and examined at bedside.  Patient denies overt nausea, vomiting, fever or chills.  Has passed gas and had bowel movement.    Objective: Vitals:   07/30/21 1339 07/30/21 2017 07/31/21 0507 07/31/21 1352  BP: 124/85 131/87 140/77 105/62  Pulse: 88 92 99 97  Resp: 17 17 17 18   Temp: 98.1 F (36.7 C) 97.9 F (36.6 C) 97.9 F (36.6 C) 97.9 F (36.6 C)  TempSrc: Oral     SpO2: 100% 100% 98% 99%  Weight:      Height:        Intake/Output Summary (Last 24 hours) at 07/31/2021 1356 Last data filed at 07/31/2021 0600 Gross per 24 hour  Intake 2256.1 ml  Output 1320 ml  Net  936.1 ml    Filed Weights   07/19/21 1942 07/22/21 1209  Weight: 77.1 kg 77.1 kg    Physical examination: General:  Average built, not in obvious distress HENT:   No scleral pallor or icterus noted. Oral mucosa is moist.  Chest:  Clear breath sounds.  Diminished  breath sounds bilaterally. No crackles or wheezes.  CVS: S1 &S2 heard. No murmur.  Regular rate and rhythm. Abdomen: Soft, nontender, nondistended.  Bowel sounds are heard.  Incisions clean dry and intact. Extremities: No cyanosis, clubbing or edema.  Peripheral pulses are palpable. Psych: Alert, awake and oriented, normal mood CNS:  No cranial nerve deficits.  Power equal in all extremities.   Skin: Warm and dry.  No rashes noted.  Data Reviewed: I have personally reviewed the following labs and imaging studies.    CBC: Recent Labs  Lab 07/25/21 0337 07/27/21 0323 07/29/21 0737 07/30/21 0714 07/31/21 0909  WBC 18.4* 12.9* 9.6 8.9 11.9*  HGB 9.1* 8.1* 7.7* 7.3* 7.9*  HCT 31.3* 28.5* 27.4* 25.8* 27.2*  MCV 69.9* 71.1* 71.4* 71.3* 71.8*  PLT 622* 508* 550* 542* 636*    Basic Metabolic Panel: Recent Labs  Lab 07/27/21 0323 07/29/21 0737 07/30/21 0714 07/31/21 0909  NA 134* 136 135 132*  K 3.4* 4.0 4.0 3.9  CL 95* 101 104 101  CO2 27 26 24 22   GLUCOSE 104* 107* 92 87  BUN 25* 14 10 9   CREATININE 0.92 0.81 0.77 0.75  CALCIUM 8.7* 8.8* 8.4* 8.4*  MG  --  1.9 2.0  --     GFR: Estimated Creatinine Clearance: 114.2 mL/min (by C-G formula based on SCr of 0.75 mg/dL). Liver Function Tests: No results for input(s): AST, ALT, ALKPHOS, BILITOT, PROT, ALBUMIN in the last 168 hours.  No results for input(s): LIPASE, AMYLASE in the last 168 hours.  No results for input(s): AMMONIA in the last 168 hours. Coagulation Profile: No results for input(s): INR, PROTIME in the last 168 hours. Cardiac Enzymes: No results for input(s): CKTOTAL, CKMB, CKMBINDEX, TROPONINI in the last 168 hours. BNP (last 3 results) No results for input(s): PROBNP in the last 8760 hours. HbA1C: No results for input(s): HGBA1C in the last 72 hours. CBG: No results for input(s): GLUCAP in the last 168 hours. Lipid Profile: No results for input(s): CHOL, HDL, LDLCALC, TRIG, CHOLHDL, LDLDIRECT in the last  72 hours. Thyroid Function Tests: No results for input(s): TSH, T4TOTAL, FREET4, T3FREE, THYROIDAB in the last 72 hours. Anemia Panel: No results for input(s): VITAMINB12, FOLATE, FERRITIN, TIBC, IRON, RETICCTPCT in the last 72 hours. Sepsis Labs: No results for input(s): PROCALCITON, LATICACIDVEN in the last 168 hours.  Recent Results (from the past 240 hour(s))  Surgical pcr screen     Status: None   Collection Time: 07/23/21  6:07 AM   Specimen: Nasal Mucosa; Nasal Swab  Result Value Ref Range Status   MRSA, PCR NEGATIVE NEGATIVE Final   Staphylococcus aureus NEGATIVE NEGATIVE Final    Comment: (NOTE) The Xpert SA Assay (FDA approved for NASAL specimens in patients 64 years of age and older), is one component of a comprehensive surveillance program. It is not intended to diagnose infection nor to guide or monitor treatment. Performed at [facility], [place] [address] [place], [place] [address]        Radiology Studies: CT ABDOMEN PELVIS W CONTRAST  Result Date: 07/30/2021 CLINICAL DATA:  Abdominal distension EXAM: CT ABDOMEN AND PELVIS WITH CONTRAST TECHNIQUE: Multidetector CT  imaging of the abdomen and pelvis was performed using the standard protocol following bolus administration of intravenous contrast. CONTRAST:  2mL OMNIPAQUE IOHEXOL 350 MG/ML SOLN COMPARISON:  CT abdomen pelvis dated November 18th 2022 FINDINGS: Lower chest: Trace right pleural effusion and bibasilar atelectasis. Hepatobiliary: No focal liver lesions. Layering high density material seen in the gallbladder, likely due to sludge. Mild intrahepatic biliary ductal dilation. Pancreas: Unremarkable. No pancreatic ductal dilatation or surrounding inflammatory changes. Spleen: Normal in size without focal abnormality. Adrenals/Urinary Tract: Bilateral adrenal glands are unremarkable. No hydronephrosis or nephrolithiasis. Bladder is unremarkable. Stomach/Bowel: Stomach is within normal limits.  Interval right hemicolectomy with fat stranding, trace fluid and a few locules of free intraperitoneal air are seen at the postsurgical site. Mildly dilated loops small and large bowel with no focal transition point. Vascular/Lymphatic: Mild aortic atherosclerosis. No enlarged abdominal or pelvic lymph nodes. Reproductive: Prostate is unremarkable. Other: Gas of the subcutaneous soft tissues of the anterior abdomen and gas seen in the left inguinal canal, likely postsurgical. No abdominopelvic ascites. Musculoskeletal: No acute or significant osseous findings. IMPRESSION: 1. Postsurgical changes of right hemicolectomy. 2. Mildly dilated loops of small and large bowel with no transition point, findings are favored to be due to ileus. Electronically Signed   By: Yetta Glassman M.D.   On: 07/30/2021 15:07      Scheduled Meds:  acetaminophen  1,000 mg Oral TID   carvedilol  25 mg Oral BID WC   cyclobenzaprine  10 mg Oral BID   docusate sodium  100 mg Oral BID   feeding supplement  237 mL Oral BID BM   ferrous sulfate  325 mg Oral BID WC   gabapentin  300 mg Oral TID   tapentadol HCl  75 mg Oral QID   Continuous Infusions:  sodium chloride 75 mL/hr at 07/31/21 1222     LOS: 11 days    Flora Lipps, MD [facility] 07/31/2021, 1:56 PM

## 2021-07-31 NOTE — Progress Notes (Signed)
8 Days Post-Op   Subjective/Chief Complaint: Tolerating liquids.  +BM and flatus this am.  No nausea.  hungry   Objective: Vital signs in last 24 hours: Temp:  [97.9 F (36.6 C)-98.1 F (36.7 C)] 97.9 F (36.6 C) (11/30 0507) Pulse Rate:  [88-99] 99 (11/30 0507) Resp:  [17] 17 (11/30 0507) BP: (124-140)/(77-87) 140/77 (11/30 0507) SpO2:  [98 %-100 %] 98 % (11/30 0507) Last BM Date: 07/27/21  Intake/Output from previous day: 11/29 0701 - 11/30 0700 In: 2565.9 [P.O.:720; I.V.:1845.9] Out: 1320 [Urine:1320] Intake/Output this shift: No intake/output data recorded.    Gen:  Alert, NAD, pleasant Card:  RRR Pulm:  rate and effort normal Abd: Soft, mild distension, mild incisional tenderness to palpation, +BS, incisions cdi    Lab Results:  Recent Labs    07/29/21 0737 07/30/21 0714  WBC 9.6 8.9  HGB 7.7* 7.3*  HCT 27.4* 25.8*  PLT 550* 542*   BMET Recent Labs    07/29/21 0737 07/30/21 0714  NA 136 135  K 4.0 4.0  CL 101 104  CO2 26 24  GLUCOSE 107* 92  BUN 14 10  CREATININE 0.81 0.77  CALCIUM 8.8* 8.4*   PT/INR No results for input(s): LABPROT, INR in the last 72 hours. ABG No results for input(s): PHART, HCO3 in the last 72 hours.  Invalid input(s): PCO2, PO2  Studies/Results: CT ABDOMEN PELVIS W CONTRAST  Result Date: 07/30/2021 CLINICAL DATA:  Abdominal distension EXAM: CT ABDOMEN AND PELVIS WITH CONTRAST TECHNIQUE: Multidetector CT imaging of the abdomen and pelvis was performed using the standard protocol following bolus administration of intravenous contrast. CONTRAST:  11mL OMNIPAQUE IOHEXOL 350 MG/ML SOLN COMPARISON:  CT abdomen pelvis dated November 18th 2022 FINDINGS: Lower chest: Trace right pleural effusion and bibasilar atelectasis. Hepatobiliary: No focal liver lesions. Layering high density material seen in the gallbladder, likely due to sludge. Mild intrahepatic biliary ductal dilation. Pancreas: Unremarkable. No pancreatic ductal  dilatation or surrounding inflammatory changes. Spleen: Normal in size without focal abnormality. Adrenals/Urinary Tract: Bilateral adrenal glands are unremarkable. No hydronephrosis or nephrolithiasis. Bladder is unremarkable. Stomach/Bowel: Stomach is within normal limits. Interval right hemicolectomy with fat stranding, trace fluid and a few locules of free intraperitoneal air are seen at the postsurgical site. Mildly dilated loops small and large bowel with no focal transition point. Vascular/Lymphatic: Mild aortic atherosclerosis. No enlarged abdominal or pelvic lymph nodes. Reproductive: Prostate is unremarkable. Other: Gas of the subcutaneous soft tissues of the anterior abdomen and gas seen in the left inguinal canal, likely postsurgical. No abdominopelvic ascites. Musculoskeletal: No acute or significant osseous findings. IMPRESSION: 1. Postsurgical changes of right hemicolectomy. 2. Mildly dilated loops of small and large bowel with no transition point, findings are favored to be due to ileus. Electronically Signed   By: Yetta Glassman M.D.   On: 07/30/2021 15:07    Anti-infectives: Anti-infectives (From admission, onward)    Start     Dose/Rate Route Frequency Ordered Stop   07/23/21 1030  cefoTEtan (CEFOTAN) 2 g in sodium chloride 0.9 % 100 mL IVPB        2 g 200 mL/hr over 30 Minutes Intravenous On call to O.R. 07/22/21 1628 07/23/21 1121       Assessment/Plan: Right colon mass POD#8 S/p XI ROBOT ASSISTED LAPAROSCOPIC RIGHT COLECTOMY  11/22 Dr. 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  Discussed with patient -Primary team contacting oncology for outpatient appointment - Haney 2.9 -Staging chest CT scan  showed no evident metastatic disease - Reorder home pain medications (Nucynta, flexeril, gabapentin). Increase Oxycodone IR to 15 mg PO q4 PRN.  Schedule tylenol - Mobilize, encourage ambulation - adv to FLD today. - CT a/p  relatively unremarkable except some ileus.  Will slowly adv diet given he is having bowel function currently   ID - cefotetan periop FEN - FLD VTE - SCDs, ok for chemical dvt prophylaxis from surgical standpoint Foley - none   HTN GERD Seizure d/o - last seizure 2017, not on medication Remote h/o alcohol abuse - 2017 Microcytic anemia Chronic pain    LOS: 11 days    Joseph Haney 07/31/2021

## 2021-07-31 NOTE — Progress Notes (Signed)
Chaplain provided initial support to Joseph Haney as he copes with his unexpected diagnosis.  He was in good spirits and is hopeful that they were able to do what they needed to do with his procedure.  He has good support and did not have any additional needs at this time.  He has our information for follow up support if needed.  [place], Bcc Pager, [phone] 4:44 PM

## 2021-07-31 NOTE — Plan of Care (Signed)
?  Problem: Activity: ?Goal: Risk for activity intolerance will decrease ?Outcome: Progressing ?  ?Problem: Safety: ?Goal: Ability to remain free from injury will improve ?Outcome: Progressing ?  ?Problem: Pain Managment: ?Goal: General experience of comfort will improve ?Outcome: Progressing ?  ?

## 2021-08-01 DIAGNOSIS — K6389 Other specified diseases of intestine: Secondary | ICD-10-CM | POA: Diagnosis not present

## 2021-08-01 DIAGNOSIS — C189 Malignant neoplasm of colon, unspecified: Secondary | ICD-10-CM | POA: Diagnosis not present

## 2021-08-01 DIAGNOSIS — F1010 Alcohol abuse, uncomplicated: Secondary | ICD-10-CM | POA: Diagnosis not present

## 2021-08-01 DIAGNOSIS — D500 Iron deficiency anemia secondary to blood loss (chronic): Secondary | ICD-10-CM | POA: Diagnosis not present

## 2021-08-01 LAB — CBC
HCT: 26 % — ABNORMAL LOW (ref 39.0–52.0)
Hemoglobin: 7.5 g/dL — ABNORMAL LOW (ref 13.0–17.0)
MCH: 20.3 pg — ABNORMAL LOW (ref 26.0–34.0)
MCHC: 28.8 g/dL — ABNORMAL LOW (ref 30.0–36.0)
MCV: 70.5 fL — ABNORMAL LOW (ref 80.0–100.0)
Platelets: 627 10*3/uL — ABNORMAL HIGH (ref 150–400)
RBC: 3.69 MIL/uL — ABNORMAL LOW (ref 4.22–5.81)
RDW: 25.7 % — ABNORMAL HIGH (ref 11.5–15.5)
WBC: 7 10*3/uL (ref 4.0–10.5)
nRBC: 0 % (ref 0.0–0.2)

## 2021-08-01 LAB — BASIC METABOLIC PANEL
Anion gap: 7 (ref 5–15)
BUN: 6 mg/dL (ref 6–20)
CO2: 25 mmol/L (ref 22–32)
Calcium: 8.2 mg/dL — ABNORMAL LOW (ref 8.9–10.3)
Chloride: 103 mmol/L (ref 98–111)
Creatinine, Ser: 0.76 mg/dL (ref 0.61–1.24)
GFR, Estimated: >60 mL/min (ref >60)
Glucose, Bld: 100 mg/dL — ABNORMAL HIGH (ref 70–99)
Potassium: 3.8 mmol/L (ref 3.5–5.1)
Sodium: 135 mmol/L (ref 135–145)

## 2021-08-01 MED ORDER — OXYCODONE HCL 15 MG PO TABS: 15.0000 mg | ORAL_TABLET | ORAL | 0 refills | Status: AC | PRN

## 2021-08-01 MED ORDER — OXYCODONE HCL 15 MG PO TABS: 15.0000 mg | ORAL_TABLET | ORAL | 0 refills | Status: DC | PRN

## 2021-08-01 MED ORDER — CARVEDILOL 25 MG PO TABS: 25.0000 mg | ORAL_TABLET | Freq: Two times a day (BID) | ORAL | 2 refills | Status: DC

## 2021-08-01 MED ORDER — POLYETHYLENE GLYCOL 3350 17 G PO PACK
17.0000 g | PACK | Freq: Every day | ORAL | 0 refills | Status: DC | PRN
Start: 2021-08-01 — End: 2023-12-01

## 2021-08-01 MED ORDER — ENSURE ENLIVE PO LIQD: 237.0000 mL | Freq: Two times a day (BID) | ORAL | 0 refills | Status: AC

## 2021-08-01 MED ORDER — FERROUS SULFATE 325 (65 FE) MG PO TABS: 325.0000 mg | ORAL_TABLET | Freq: Two times a day (BID) | ORAL | 3 refills | Status: DC

## 2021-08-01 NOTE — Progress Notes (Signed)
I spoke with Mr Joseph Haney.  He is aware of his appt on 12/15 at 1100 with Cira Rue, NP and Dr Burr Medico.  He is able to make this appt. I asked that he arrive at 1045 for registration.  He is aware of our location.  I provided our contact information. All questions were answered.  He verbalized understanding.

## 2021-08-01 NOTE — Progress Notes (Signed)
9 Days Post-Op   Subjective/Chief Complaint: Tolerating some food - reports he threw up overnight.  BM yesterday.  No nausea currently.    Objective: Vital signs in last 24 hours: Temp:  [97.9 F (36.6 C)-98.4 F (36.9 C)] 98.3 F (36.8 C) (12/01 0522) Pulse Rate:  [90-97] 90 (12/01 0522) Resp:  [17-18] 17 (12/01 0522) BP: (105-157)/(62-87) 157/87 (12/01 0522) SpO2:  [97 %-99 %] 97 % (12/01 0522) Last BM Date: 07/31/21  Intake/Output from previous day: 11/30 0701 - 12/01 0700 In: 600 [P.O.:600] Out: 600 [Urine:600] Intake/Output this shift: No intake/output data recorded.    Gen:  Alert, NAD, pleasant Card:  RRR Pulm:  rate and effort normal Abd: Soft, mild distension, mild incisional tenderness to palpation, +BS, incisions cdi    Lab Results:  Recent Labs    07/30/21 0714 07/31/21 0909  WBC 8.9 11.9*  HGB 7.3* 7.9*  HCT 25.8* 27.2*  PLT 542* 636*   BMET Recent Labs    07/30/21 0714 07/31/21 0909  NA 135 132*  K 4.0 3.9  CL 104 101  CO2 24 22  GLUCOSE 92 87  BUN 10 9  CREATININE 0.77 0.75  CALCIUM 8.4* 8.4*   PT/INR No results for input(s): LABPROT, INR in the last 72 hours. ABG No results for input(s): PHART, HCO3 in the last 72 hours.  Invalid input(s): PCO2, PO2  Studies/Results: CT ABDOMEN PELVIS W CONTRAST  Result Date: 07/30/2021 CLINICAL DATA:  Abdominal distension EXAM: CT ABDOMEN AND PELVIS WITH CONTRAST TECHNIQUE: Multidetector CT imaging of the abdomen and pelvis was performed using the standard protocol following bolus administration of intravenous contrast. CONTRAST:  64mL OMNIPAQUE IOHEXOL 350 MG/ML SOLN COMPARISON:  CT abdomen pelvis dated November 18th 2022 FINDINGS: Lower chest: Trace right pleural effusion and bibasilar atelectasis. Hepatobiliary: No focal liver lesions. Layering high density material seen in the gallbladder, likely due to sludge. Mild intrahepatic biliary ductal dilation. Pancreas: Unremarkable. No pancreatic  ductal dilatation or surrounding inflammatory changes. Spleen: Normal in size without focal abnormality. Adrenals/Urinary Tract: Bilateral adrenal glands are unremarkable. No hydronephrosis or nephrolithiasis. Bladder is unremarkable. Stomach/Bowel: Stomach is within normal limits. Interval right hemicolectomy with fat stranding, trace fluid and a few locules of free intraperitoneal air are seen at the postsurgical site. Mildly dilated loops small and large bowel with no focal transition point. Vascular/Lymphatic: Mild aortic atherosclerosis. No enlarged abdominal or pelvic lymph nodes. Reproductive: Prostate is unremarkable. Other: Gas of the subcutaneous soft tissues of the anterior abdomen and gas seen in the left inguinal canal, likely postsurgical. No abdominopelvic ascites. Musculoskeletal: No acute or significant osseous findings. IMPRESSION: 1. Postsurgical changes of right hemicolectomy. 2. Mildly dilated loops of small and large bowel with no transition point, findings are favored to be due to ileus. Electronically Signed   By: Yetta Glassman M.D.   On: 07/30/2021 15:07    Anti-infectives: Anti-infectives (From admission, onward)    Start     Dose/Rate Route Frequency Ordered Stop   07/23/21 1030  cefoTEtan (CEFOTAN) 2 g in sodium chloride 0.9 % 100 mL IVPB        2 g 200 mL/hr over 30 Minutes Intravenous On call to O.R. 07/22/21 1628 07/23/21 1121       Assessment/Plan: Right colon mass POD#9 S/p XI ROBOT ASSISTED LAPAROSCOPIC RIGHT COLECTOMY  11/22 Dr. 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  Discussed with patient -Primary team contacting oncology for outpatient appointment - CEA 2.9 -Staging  chest CT scan showed no evident metastatic disease - Reorder home pain medications (Nucynta, flexeril, gabapentin). Increase Oxycodone IR to 15 mg PO q4 PRN.  Schedule tylenol - Mobilize, encourage ambulation - Change to D3 diet - CT  a/p relatively unremarkable except some ileus - Continue to monitor today given emesis overnight; MIVF if necessary   ID - cefotetan periop FEN - D3 diet VTE - SCDs, ok for chemical dvt prophylaxis from surgical standpoint Foley - none   HTN GERD Seizure d/o - last seizure 2017, not on medication Remote h/o alcohol abuse - 2017 Microcytic anemia Chronic pain    LOS: 12 days    Ileana Roup 08/01/2021

## 2021-08-01 NOTE — Plan of Care (Signed)
?  Problem: Activity: ?Goal: Risk for activity intolerance will decrease ?Outcome: Progressing ?  ?Problem: Safety: ?Goal: Ability to remain free from injury will improve ?Outcome: Progressing ?  ?Problem: Pain Managment: ?Goal: General experience of comfort will improve ?Outcome: Progressing ?  ?

## 2021-08-01 NOTE — Progress Notes (Signed)
Discharge package printed and instructions given to patient. Patient verbalizes understanding. 

## 2021-08-01 NOTE — Discharge Summary (Addendum)
Physician Discharge Summary  Joseph Haney JOI:[id number] DOB: [birth year] DOA: 07/19/2021  PCP: Merryl Hacker, No  Admit date: 07/19/2021 Discharge date: 08/01/2021  Admitted From: Home  Discharge disposition: Home  Recommendations for Outpatient Follow-Up:   Follow up with your primary care provider in one week.  Check CBC, BMP, magnesium in the next visit Follow-up with general surgery in 2 weeks, oncology as scheduled by the clinic and with GI in 6 months.  Discharge Diagnosis:   Principal Problem:   Colon adenocarcinoma ([place]) Active Problems:   Alcohol abuse   Seizure disorder ([place])   Colonic mass   Iron deficiency anemia due to chronic blood loss   Discharge Condition: Improved.  Diet recommendation: Soft diet.  Wound care: None.  Code status: Full.   History of Present Illness:   Joseph Haney is a 45 year old male with past medical history significant for essential hypertension, GERD, seizure disorder, EtOH abuse who presented to [facility] ED on 11/18 with 1 month history of progressive abdominal pain, nausea/vomiting.  Patient has not followed up with her PCP in greater than 2 years since office closed and has not been taking medications since that timeframe.    Patient also has apparently been vomiting without letting any of his family know.  CT abdomen/pelvis notable for focal area of high-grade narrowing proximal ascending colon.  Gastroenterology was consulted.  Hospitalist team was consulted for further evaluation and management of abdominal pain with intractable nausea and vomiting with high-grade narrowing proximal ascending colon.  Patient was then admitted hospital for further evaluation and treatment.  Hospital Course:   Following conditions were addressed during hospitalization as listed below,  Invasive colorectal adenocarcinoma status post robotic surgery followed by Postoperative ileus secondary to surgery, prolonged  Pathology from colonoscopy consistent  with invasive colorectal adenocarcinoma, moderately differentiated.  General surgery was consulted and patient underwent robotic assisted laparoscopic right colectomy by Dr. Rosendo Gros on 07/23/2021. Pathology from colectomy with invasive moderately differentiated colonic adenocarcinoma extending into the pericolonic connective tissue, metastatic carcinoma and 9 of 19 lymph nodes.  CT chest without any metastatic disease.  Repeat CT scan was performed on 07/30/2021 with findings of mildly dilated small and large bowel with no transition point thought to be secondary to ileus.  Patient was able to take soft diet but had 1 episode of vomiting.  Patient has been seen by general surgery prior to discharge today.  Plan is to follow-up with general surgery in 2 weeks, gastroenterology in 6 months.  Patient will be followed up with oncology as outpatient after surgery.   Essential hypertension Will continue Coreg on discharge.   Seizure disorder Last seizure reported 2017, currently not on medication outpatient.    History of alcohol abuse No withdrawal symptoms.    Microcytic anemia Likely secondary to colon cancer.  Received 2 units of packed RBC during hospitalization.  We will continue on iron sulfate.  Latest hemoglobin of 7.9.   Leukocytosis: resolved Likely reactive.  No overt signs of infection at this time.   Disposition.  At this time, patient is stable for disposition home with outpatient PCP, GI, general surgery follow-up.  Medical Consultants:   [facility], Dr. Mariana Arn - signed off 11/23 General surgery  Procedures:    Colonoscopy, Dr. Alessandra Bevels 11/21 Right robotic colectomy, Dr. Rosendo Gros 11/22  Subjective:   Today, patient was seen and examined at bedside.  Patient 1 episode of vomiting yesterday but has otherwise tolerated okay.  Has mild nausea.    Discharge Exam:  Vitals:   07/31/21 2233 08/01/21 0522  BP: 139/85 (!) 157/87  Pulse: 92 90  Resp: 17 17  Temp: 98.4 F  (36.9 C) 98.3 F (36.8 C)  SpO2: 98% 97%   Vitals:   07/31/21 0507 07/31/21 1352 07/31/21 2233 08/01/21 0522  BP: 140/77 105/62 139/85 (!) 157/87  Pulse: 99 97 92 90  Resp: 17 18 17 17   Temp: 97.9 F (36.6 C) 97.9 F (36.6 C) 98.4 F (36.9 C) 98.3 F (36.8 C)  TempSrc:      SpO2: 98% 99% 98% 97%  Weight:      Height:        General: Alert awake, not in obvious distress HENT: pupils equally reacting to light,  No scleral pallor or icterus noted. Oral mucosa is moist.  Chest:  Clear breath sounds.  Diminished breath sounds bilaterally. No crackles or wheezes.  CVS: S1 &S2 heard. No murmur.  Regular rate and rhythm. Abdomen: Soft, nontender, nondistended.  Bowel sounds are heard.   Extremities: No cyanosis, clubbing or edema.  Peripheral pulses are palpable. Psych: Alert, awake and oriented, normal mood CNS:  No cranial nerve deficits.  Power equal in all extremities.   Skin: Warm and dry.  No rashes noted.  The results of significant diagnostics from this hospitalization (including imaging, microbiology, ancillary and laboratory) are listed below for reference.     Diagnostic Studies:   CT ABDOMEN PELVIS W CONTRAST  Result Date: 07/19/2021 CLINICAL DATA:  Abdominal distension. EXAM: CT ABDOMEN AND PELVIS WITH CONTRAST TECHNIQUE: Multidetector CT imaging of the abdomen and pelvis was performed using the standard protocol following bolus administration of intravenous contrast. CONTRAST:  159mL OMNIPAQUE IOHEXOL 300 MG/ML  SOLN COMPARISON:  CT of the abdomen pelvis dated 05/20/2013. FINDINGS: Lower chest: Linear atelectasis/scarring in the right middle lobe and lingula. The visualized lung bases are otherwise clear. No intra-abdominal free air or free fluid. Hepatobiliary: Subcentimeter hypodense lesion in the dome of the liver is not characterized. The liver is otherwise unremarkable. No intrahepatic biliary ductal dilatation. Probable small amount of sludge or small stones within  gallbladder. No pericholecystic fluid or evidence of acute cholecystitis by CT. Pancreas: Unremarkable. No pancreatic ductal dilatation or surrounding inflammatory changes. Spleen: Normal in size without focal abnormality. Adrenals/Urinary Tract: The adrenal glands, kidneys, visualized ureters, and urinary bladder appear unremarkable. Stomach/Bowel: There is focal area of high-grade narrowing of the proximal ascending colon which may be related to stricture secondary to chronic inflammation or a mass. There is dilatation of the cecum and distal and terminal ileum. The terminal ileum measures 3.6 cm in diameter. There is thickened and dilated appearance of the appendix measuring 15 mm in thickness. This likely represents distension of the appendix secondary to obstruction of the proximal ascending colon. Acute appendicitis is less likely. Vascular/Lymphatic: The abdominal aorta and IVC are unremarkable. No portal venous gas. There is no adenopathy. Reproductive: The prostate and seminal vesicles are grossly unremarkable. No pelvic mass. Other: Small fat containing umbilical hernia. Musculoskeletal: No acute or significant osseous findings. IMPRESSION: Focal area of high-grade narrowing of the proximal ascending colon may be sequela of chronic inflammation and stricture versus a mass. There is mild distension of the cecum and terminal ileum. Clinical correlation and further evaluation with lower GI study or colonoscopy recommended. Electronically Signed   By: Anner Crete M.D.   On: 07/19/2021 21:30     Labs:   Basic Metabolic Panel: Recent Labs  Lab 07/27/21 0323 07/29/21 0737 07/30/21 2423  07/31/21 0909  NA 134* 136 135 132*  K 3.4* 4.0 4.0 3.9  CL 95* 101 104 101  CO2 27 26 24 22   GLUCOSE 104* 107* 92 87  BUN 25* 14 10 9   CREATININE 0.92 0.81 0.77 0.75  CALCIUM 8.7* 8.8* 8.4* 8.4*  MG  --  1.9 2.0  --    GFR Estimated Creatinine Clearance: 114.2 mL/min (by C-G formula based on SCr of  0.75 mg/dL). Liver Function Tests: No results for input(s): AST, ALT, ALKPHOS, BILITOT, PROT, ALBUMIN in the last 168 hours. No results for input(s): LIPASE, AMYLASE in the last 168 hours. No results for input(s): AMMONIA in the last 168 hours. Coagulation profile No results for input(s): INR, PROTIME in the last 168 hours.  CBC: Recent Labs  Lab 07/27/21 0323 07/29/21 0737 07/30/21 0714 07/31/21 0909  WBC 12.9* 9.6 8.9 11.9*  HGB 8.1* 7.7* 7.3* 7.9*  HCT 28.5* 27.4* 25.8* 27.2*  MCV 71.1* 71.4* 71.3* 71.8*  PLT 508* 550* 542* 636*   Cardiac Enzymes: No results for input(s): CKTOTAL, CKMB, CKMBINDEX, TROPONINI in the last 168 hours. BNP: Invalid input(s): POCBNP CBG: No results for input(s): GLUCAP in the last 168 hours. D-Dimer No results for input(s): DDIMER in the last 72 hours. Hgb A1c No results for input(s): HGBA1C in the last 72 hours. Lipid Profile No results for input(s): CHOL, HDL, LDLCALC, TRIG, CHOLHDL, LDLDIRECT in the last 72 hours. Thyroid function studies No results for input(s): TSH, T4TOTAL, T3FREE, THYROIDAB in the last 72 hours.  Invalid input(s): FREET3 Anemia work up No results for input(s): VITAMINB12, FOLATE, FERRITIN, TIBC, IRON, RETICCTPCT in the last 72 hours. Microbiology Recent Results (from the past 240 hour(s))  Surgical pcr screen     Status: None   Collection Time: 07/23/21  6:07 AM   Specimen: Nasal Mucosa; Nasal Swab  Result Value Ref Range Status   MRSA, PCR NEGATIVE NEGATIVE Final   Staphylococcus aureus NEGATIVE NEGATIVE Final    Comment: (NOTE) The Xpert SA Assay (FDA approved for NASAL specimens in patients 46 years of age and older), is one component of a comprehensive surveillance program. It is not intended to diagnose infection nor to guide or monitor treatment. Performed at [facility], [employer] [address], [place], [place] [address]      Discharge Instructions:   Discharge Instructions     Call  MD for:  persistant nausea and vomiting   Complete by: As directed    Call MD for:  redness, tenderness, or signs of infection (pain, swelling, redness, odor or green/yellow discharge around incision site)   Complete by: As directed    Call MD for:  severe uncontrolled pain   Complete by: As directed    Call MD for:  temperature >100.4   Complete by: As directed    Discharge instructions   Complete by: As directed    Follow-up with general surgery, Dr. Rosendo Gros as outpatient in  2 weeks.  Follow-up with Dr. Alessandra Bevels GI in 6 months for surveillance colonoscopy.  Follow-up with oncology (as scheduled by the clinic) to discuss about further treatment plan.  Soft diet and advance as tolerated.  Please seek medical attention for worsening symptoms.   Increase activity slowly   Complete by: As directed    No dressing needed   Complete by: As directed       Allergies as of 08/01/2021       Reactions   Morphine And Related Itching   Other  Other (See Comments)   Pt refuses blood products. He is a Sales promotion account executive [employer], but I sopen to talk about receiving blood products.        Medication List     STOP taking these medications    levETIRAcetam 500 MG tablet Commonly known as: KEPPRA   loperamide 2 MG capsule Commonly known as: IMODIUM       TAKE these medications    carvedilol 25 MG tablet Commonly known as: COREG Take 1 tablet (25 mg total) by mouth 2 (two) times daily with a meal. What changed:  medication strength how much to take   cyclobenzaprine 10 MG tablet Commonly known as: FLEXERIL Take 10-20 mg by mouth 2 (two) times daily as needed for muscle spasms.   docusate sodium 100 MG capsule Commonly known as: COLACE Take 100 mg by mouth daily.   feeding supplement Liqd Take 237 mLs by mouth 2 (two) times daily between meals.   ferrous sulfate 325 (65 FE) MG tablet Take 1 tablet (325 mg total) by mouth 2 (two) times daily with a meal.   gabapentin 300 MG  capsule Commonly known as: NEURONTIN Take 300 mg by mouth See admin instructions. Take 300 mg by mouth three to four times a day   methocarbamol 500 MG tablet Commonly known as: ROBAXIN Take 1 tablet (500 mg total) by mouth 2 (two) times daily.   Nucynta 75 MG tablet Generic drug: tapentadol HCl Take 75 mg by mouth See admin instructions. Take 75 mg by mouth four to five times a day as needed for pain   omeprazole 20 MG capsule Commonly known as: PRILOSEC Take 1 capsule (20 mg total) by mouth daily.   oxyCODONE 15 MG immediate release tablet Commonly known as: ROXICODONE Take 1 tablet (15 mg total) by mouth every 4 (four) hours as needed for up to 5 days for moderate pain or severe pain (5mg  moderate, 10mg  severe).   polyethylene glycol 17 g packet Commonly known as: MIRALAX / GLYCOLAX Take 17 g by mouth daily as needed for mild constipation or moderate constipation.               Durable Medical Equipment  (From admission, onward)           Start     Ordered   07/29/21 1218  For home use only DME Walker rolling  Once       Question Answer Comment  Walker: With [place] Wheels   Patient needs a walker to treat with the following condition Gait disorder      07/29/21 1218              Discharge Care Instructions  (From admission, onward)           Start     Ordered   08/01/21 0000  No dressing needed        08/01/21 1158            Follow-up Information     Brahmbhatt, Parag, MD. Schedule an appointment as soon as possible for a visit in 6 month(s).   Specialty: Gastroenterology Why: Follow-up for colon cancer Contact information: [place] [place] [address] [phone]         Ralene Ok, MD Follow up in 2 week(s).   Specialty: General Surgery Contact information: [address] [place]  [address] [phone]         Heath Lark, MD Follow up.  Specialty: Hematology and  Oncology Why: office to call, if not heard from office please call in 2-3 days Contact information: [place] [address] [phone]                  Time coordinating discharge: 39 minutes  Signed:  Danelia Snodgrass  [facility] 08/01/2021, 11:59 AM

## 2021-08-01 NOTE — Plan of Care (Signed)
°  Problem: Clinical Measurements: °Goal: Ability to maintain clinical measurements within normal limits will improve °Outcome: Progressing °  °Problem: Activity: °Goal: Risk for activity intolerance will decrease °Outcome: Progressing °  °Problem: Nutrition: °Goal: Adequate nutrition will be maintained °Outcome: Progressing °  °

## 2021-08-05 ENCOUNTER — Emergency Department (HOSPITAL_COMMUNITY)
Admission: EM | Admit: 2021-08-05 | Discharge: 2021-08-05 | Disposition: A | Discharge status: Home / Self Care | Payer: Medicaid Other | Attending: Emergency Medicine | Admitting: Emergency Medicine

## 2021-08-05 ENCOUNTER — Encounter (HOSPITAL_COMMUNITY): Payer: Self-pay

## 2021-08-05 ENCOUNTER — Other Ambulatory Visit: Payer: Self-pay

## 2021-08-05 ENCOUNTER — Emergency Department (HOSPITAL_COMMUNITY): Payer: Medicaid Other

## 2021-08-05 DIAGNOSIS — K91872 Postprocedural seroma of a digestive system organ or structure following a digestive system procedure: Secondary | ICD-10-CM | POA: Diagnosis present

## 2021-08-05 DIAGNOSIS — S301XXA Contusion of abdominal wall, initial encounter: Secondary | ICD-10-CM

## 2021-08-05 DIAGNOSIS — K219 Gastro-esophageal reflux disease without esophagitis: Secondary | ICD-10-CM | POA: Insufficient documentation

## 2021-08-05 DIAGNOSIS — Z85038 Personal history of other malignant neoplasm of large intestine: Secondary | ICD-10-CM | POA: Insufficient documentation

## 2021-08-05 LAB — COMPREHENSIVE METABOLIC PANEL
ALT: 8 U/L (ref 0–44)
AST: 12 U/L — ABNORMAL LOW (ref 15–41)
Albumin: 3.3 g/dL — ABNORMAL LOW (ref 3.5–5.0)
Alkaline Phosphatase: 59 U/L (ref 38–126)
Anion gap: 7 (ref 5–15)
BUN: 8 mg/dL (ref 6–20)
CO2: 26 mmol/L (ref 22–32)
Calcium: 8.7 mg/dL — ABNORMAL LOW (ref 8.9–10.3)
Chloride: 105 mmol/L (ref 98–111)
Creatinine, Ser: 0.8 mg/dL (ref 0.61–1.24)
GFR, Estimated: >60 mL/min (ref >60)
Glucose, Bld: 102 mg/dL — ABNORMAL HIGH (ref 70–99)
Potassium: 3.5 mmol/L (ref 3.5–5.1)
Sodium: 138 mmol/L (ref 135–145)
Total Bilirubin: 0.4 mg/dL (ref 0.3–1.2)
Total Protein: 8.5 g/dL — ABNORMAL HIGH (ref 6.5–8.1)

## 2021-08-05 LAB — URINALYSIS, ROUTINE W REFLEX MICROSCOPIC
Bilirubin Urine: NEGATIVE
Glucose, UA: NEGATIVE mg/dL
Hgb urine dipstick: NEGATIVE
Ketones, ur: NEGATIVE mg/dL
Leukocytes,Ua: NEGATIVE
Nitrite: NEGATIVE
Protein, ur: NEGATIVE mg/dL
Specific Gravity, Urine: 1.017 (ref 1.005–1.030)
pH: 5 (ref 5.0–8.0)

## 2021-08-05 LAB — CBC WITH DIFFERENTIAL/PLATELET
Abs Immature Granulocytes: 0.09 10*3/uL — ABNORMAL HIGH (ref 0.00–0.07)
Basophils Absolute: 0 10*3/uL (ref 0.0–0.1)
Basophils Relative: 1 %
Eosinophils Absolute: 0.1 10*3/uL (ref 0.0–0.5)
Eosinophils Relative: 1 %
HCT: 30.9 % — ABNORMAL LOW (ref 39.0–52.0)
Hemoglobin: 8.5 g/dL — ABNORMAL LOW (ref 13.0–17.0)
Immature Granulocytes: 1 %
Lymphocytes Relative: 25 %
Lymphs Abs: 2.1 10*3/uL (ref 0.7–4.0)
MCH: 20 pg — ABNORMAL LOW (ref 26.0–34.0)
MCHC: 27.5 g/dL — ABNORMAL LOW (ref 30.0–36.0)
MCV: 72.9 fL — ABNORMAL LOW (ref 80.0–100.0)
Monocytes Absolute: 0.5 10*3/uL (ref 0.1–1.0)
Monocytes Relative: 6 %
Neutro Abs: 5.5 10*3/uL (ref 1.7–7.7)
Neutrophils Relative %: 66 %
Platelets: 903 10*3/uL (ref 150–400)
RBC: 4.24 MIL/uL (ref 4.22–5.81)
RDW: 26.2 % — ABNORMAL HIGH (ref 11.5–15.5)
WBC: 8.3 10*3/uL (ref 4.0–10.5)
nRBC: 0 % (ref 0.0–0.2)

## 2021-08-05 LAB — LIPASE, BLOOD: Lipase: 31 U/L (ref 11–51)

## 2021-08-05 LAB — LACTIC ACID, PLASMA: Lactic Acid, Venous: 1.1 mmol/L (ref 0.5–1.9)

## 2021-08-05 MED ORDER — BACITRACIN ZINC 500 UNIT/GM EX OINT: 1.0000 "application " | TOPICAL_OINTMENT | Freq: Two times a day (BID) | CUTANEOUS | 0 refills | Status: DC

## 2021-08-05 MED ORDER — IOHEXOL 350 MG/ML SOLN
80.0000 mL | Freq: Once | INTRAVENOUS | Status: AC | PRN
  Administered 2021-08-05: 80 mL via INTRAVENOUS

## 2021-08-05 NOTE — ED Provider Notes (Signed)
Emergency Medicine Provider Triage Evaluation Note  Joseph Haney , a 45 y.o. male  was evaluated in triage.  Pt complains of abdominal pain.  He had a right colectomy on 07/23/2021 for suspected cancer.  He was discharged 4 days ago. He has had swelling over the largest incision site for about 2 to 3 days.  He has had reported low-grade fevers of 99.9 at home.  He has had an increase in nausea and an increase in pain  Review of Systems  Positive: Abdominal pain, incisional edema Negative: syncope  Physical Exam  BP 122/81   Pulse 88   Resp 16   Ht 5\' 9"  (1.753 m)   Wt 74.8 kg   SpO2 99%   BMI 24.37 kg/m  Gen:   Awake, no distress   Resp:  Normal effort  MSK:   Moves extremities without difficulty  Other:  Abdomen is tender around the largest incision with a approximately 10 cm area of induration palpable around the incision.  There is no overlying abnormal skin redness.  Medical Decision Making  Medically screening exam initiated at 4:53 PM.  Appropriate orders placed.  Mariusz Jubb was informed that the remainder of the evaluation will be completed by another provider, this initial triage assessment does not replace that evaluation, and the importance of remaining in the ED until their evaluation is complete.  Patient is postop with new swelling over his incision and low-grade fevers at home.  He is significant induration around the wound on my exam and tenderness with mild guarding.  Will obtain labs, CT scan.  NPO.   Lorin Glass, [name] 08/05/21 1656    Drenda Freeze, MD 08/05/21 [phone]

## 2021-08-05 NOTE — ED Triage Notes (Signed)
Patient states he has slight swelling to the abdominal surgical incision. Patient states slight increase of pain.  Patient states nausea today only, but denies fever or vomiting.

## 2021-08-05 NOTE — Discharge Instructions (Addendum)
The CT scan results indicate that you have a seroma. These fluid collections are typically a complication of surgical incision.  They are often self-limiting.  Apply the antibiotic ointment and consider applying dressing to the site to prevent it from getting larger.  Follow-up with your surgery team as planned.

## 2021-08-05 NOTE — ED Provider Notes (Signed)
[facility] DEPT Provider Note   CSN: [id number] Arrival date & time: 08/05/21  1514     History Chief Complaint  Patient presents with   post op issue    Joseph Haney is a 45 y.o. male.  HPI     45 year old male comes in with chief complaint of swelling at the incision site.  Patient had a colectomy done about 2 weeks ago.  He reports more recently there has been increased swelling and redness at the surgical incision site.  Patient denies any nausea, vomiting, fevers, chills.  He has moderate pain at the site, not significantly worse and rest of the area.  Patient is not on any immunosuppressive medications.  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    Past Surgical History:  Procedure Laterality Date   BIOPSY  07/22/2021   Procedure: BIOPSY;  Surgeon: [name], MD;  Location: [facility];  Service: Gastroenterology;;   COLONOSCOPY WITH PROPOFOL N/A 07/22/2021   Procedure: COLONOSCOPY WITH PROPOFOL;  Surgeon: [name], MD;  Location: [facility];  Service: Gastroenterology;  Laterality: N/A;   NO PAST  SURGERIES     SUBMUCOSAL TATTOO INJECTION  07/22/2021   Procedure: SUBMUCOSAL TATTOO INJECTION;  Surgeon: [name], MD;  Location: [facility];  Service: Gastroenterology;;       Family History  Problem Relation Age of Onset   Coronary artery disease Mother    Coronary artery disease Father    Heart attack Father 4    Social History   Tobacco Use   Smoking status: Never   Smokeless tobacco: Never  Vaping Use   Vaping Use: Never used  Substance Use Topics   Alcohol use: Not Currently    Comment: one 40 ounce a week   Drug use: No    Frequency: 1.0 times per week    Home Medications Prior to Admission medications   Medication Sig Start Date End Date Taking? Authorizing Provider  bacitracin ointment Apply 1 application topically 2 (two) times daily. 08/05/21  Yes Varney Biles, MD  carvedilol (COREG) 25 MG tablet Take 1 tablet (25 mg total) by mouth 2 (two) times daily with a meal. 08/01/21   [name], [name], MD  cyclobenzaprine (FLEXERIL) 10 MG tablet Take 10-20 mg by mouth 2 (two) times daily as needed for muscle spasms. 06/25/21   [provider]  docusate sodium (COLACE) 100 MG capsule Take 100 mg by mouth daily. Patient not taking: Reported on 07/20/2021    [provider]  feeding supplement (ENSURE ENLIVE / ENSURE PLUS) LIQD Take 237 mLs by mouth 2 (two) times daily between meals. 08/01/21 08/31/21  Flora Lipps, MD  ferrous  sulfate 325 (65 FE) MG tablet Take 1 tablet (325 mg total) by mouth 2 (two) times daily with a meal. 08/01/21   [name], [name], MD  gabapentin (NEURONTIN) 300 MG capsule Take 300 mg by mouth See admin instructions. Take 300 mg by mouth three to four times a day 06/25/21   [provider]  methocarbamol (ROBAXIN) 500 MG tablet Take 1 tablet (500 mg total) by mouth 2 (two) times daily. Patient not taking: Reported on 07/20/2021 10/14/15   Domenic Moras, [name]  NUCYNTA 75 MG tablet Take 75 mg by mouth See admin  instructions. Take 75 mg by mouth four to five times a day as needed for pain 06/28/21   [provider]  omeprazole (PRILOSEC) 20 MG capsule Take 1 capsule (20 mg total) by mouth daily. Patient not taking: Reported on 07/20/2021 11/20/15   Varney Biles, MD  oxyCODONE (ROXICODONE) 15 MG immediate release tablet Take 1 tablet (15 mg total) by mouth every 4 (four) hours as needed for up to 5 days. Take 1 tablet (15 mg total) by mouth every 4 (four) hours as needed 08/01/21 08/06/21  [name], [name], MD  polyethylene glycol (MIRALAX / GLYCOLAX) 17 g packet Take 17 g by mouth daily as needed for mild constipation or moderate constipation. 08/01/21   [name], [name], MD    Allergies    Morphine and related and Other  Review of Systems   Review of Systems  Constitutional:  Positive for activity change. Negative for fever.  Gastrointestinal:  Positive for abdominal pain. Negative for nausea and vomiting.  Skin:  Positive for color change and wound.  Allergic/Immunologic: Negative for immunocompromised state.   Physical Exam Updated Vital Signs BP (!) 140/98   Pulse 84   Resp 17   Ht 5\' 9"  (1.753 m)   Wt 74.8 kg   SpO2 100%   BMI 24.37 kg/m   Physical Exam Vitals and nursing note reviewed.  Constitutional:      Appearance: He is well-developed.  HENT:     Head: Atraumatic.  Cardiovascular:     Rate and Rhythm: Normal rate.  Pulmonary:     Effort: Pulmonary effort is normal.  Abdominal:     Tenderness: There is abdominal tenderness. There is no guarding or rebound.  Musculoskeletal:        General: Swelling present.     Cervical back: Neck supple.     Comments: Patient has surgical incision wound lateral to the umbilicus.  There is no drainage.  Mild warmth to touch.  Skin:    General: Skin is warm.  Neurological:     Mental Status: He is alert and oriented to person, place, and time.    ED Results / Procedures / Treatments   Labs (all labs ordered are listed, but  only abnormal results are displayed) Labs Reviewed  COMPREHENSIVE METABOLIC PANEL - Abnormal; Notable for the following components:      Result Value   Glucose, Bld 102 (*)    Calcium 8.7 (*)    Total Protein 8.5 (*)    Albumin 3.3 (*)    AST 12 (*)    All other components within normal limits  CBC WITH DIFFERENTIAL/PLATELET - Abnormal; Notable for the following components:   Hemoglobin 8.5 (*)    HCT 30.9 (*)    MCV 72.9 (*)    MCH 20.0 (*)    MCHC 27.5 (*)    RDW 26.2 (*)    Platelets 903 (*)  Abs Immature Granulocytes 0.09 (*)    All other components within normal limits  LIPASE, BLOOD  LACTIC ACID, PLASMA  URINALYSIS, ROUTINE W REFLEX MICROSCOPIC  PATHOLOGIST SMEAR REVIEW    EKG None  Radiology CT ABDOMEN PELVIS W CONTRAST  Result Date: 08/05/2021 CLINICAL DATA:  Abdominal pain and fever. Status post right hemicolectomy on 07/23/2021. EXAM: CT ABDOMEN AND PELVIS WITH CONTRAST TECHNIQUE: Multidetector CT imaging of the abdomen and pelvis was performed using the standard protocol following bolus administration of intravenous contrast. CONTRAST:  33mL OMNIPAQUE IOHEXOL 350 MG/ML SOLN COMPARISON:  CT abdomen pelvis dated 07/30/2021. FINDINGS: Lower chest: Trace right pleural effusion. There are bibasilar linear atelectasis. There is coronary vascular calcification of the LAD. No intra-abdominal free air.  Small free fluid within the pelvis. Hepatobiliary: Subcentimeter hypodense lesion in the dome of the liver is not characterized. The liver is otherwise unremarkable. No intrahepatic biliary dilatation. The gallbladder is unremarkable. Pancreas: Unremarkable. No pancreatic ductal dilatation or surrounding inflammatory changes. Spleen: Normal in size without focal abnormality. Adrenals/Urinary Tract: The adrenal glands are unremarkable. The kidneys, visualized ureters, and urinary bladder appear unremarkable. Stomach/Bowel: Postsurgical changes of right hemicolectomy with ileocolic  anastomosis in the right upper quadrant. There is no bowel obstruction. No fluid collection or abscess. Vascular/Lymphatic: Mild aortoiliac atherosclerotic disease. The IVC is unremarkable. No portal venous gas. There is no adenopathy. Reproductive: The prostate and seminal vesicles are grossly unremarkable. No pelvic mass. Other: Small low attenuating area in the left rectus abdominus may represent a small seroma or an area of laceration of the musculature secondary to surgical incision. No fluid collection or large hematoma. Small pockets of air in the left inguinal canal likely residual from recent surgery. Musculoskeletal: Mild degenerative changes. No acute osseous pathology. IMPRESSION: 1. Postsurgical changes of right hemicolectomy with ileocolic anastomosis in the right upper quadrant. No bowel obstruction. No fluid collection or abscess. 2. Probable surgical incision in the left rectus abdominus and small seroma. No fluid collection or large hematoma. 3. Aortic Atherosclerosis (ICD10-I70.0). Electronically Signed   By: Anner Crete M.D.   On: 08/05/2021 18:44    Procedures Procedures   Medications Ordered in ED Medications  iohexol (OMNIPAQUE) 350 MG/ML injection 80 mL (80 mLs Intravenous Contrast Given 08/05/21 1822)    ED Course  I have reviewed the triage vital signs and the nursing notes.  Pertinent labs & imaging results that were available during my care of the patient were reviewed by me and considered in my medical decision making (see chart for details).    MDM Rules/Calculators/A&P                           45 year old male comes in with chief complaint of swelling around the abdominal surgical wound site.  He is status post colectomy for about 2 weeks ago.  No nausea, vomiting, fevers, chills.  Patient had a CT scan ordered by the provider in triage.  It does not indicate any abscess.  There is evidence of seroma.  I do not think clinically patient seroma is  infected.  We have advised some topical antibiotics and follow-up with surgery team as planned.  Patient advised to apply nonadhesive dressing to the site to reduce the risk of increased swelling.  Final Clinical Impression(s) / ED Diagnoses Final diagnoses:  Abdominal wall seroma, initial encounter    Rx / DC Orders ED Discharge Orders          Ordered  bacitracin ointment  2 times daily        08/05/21 2103             Varney Biles, MD 08/05/21 2108

## 2021-08-05 NOTE — ED Notes (Signed)
Patientr has a urine culture in the main lab 

## 2021-08-06 LAB — PATHOLOGIST SMEAR REVIEW

## 2021-08-15 ENCOUNTER — Encounter: Payer: Self-pay | Admitting: Nurse Practitioner

## 2021-08-15 ENCOUNTER — Inpatient Hospital Stay: Payer: Medicaid Other | Attending: Nurse Practitioner | Admitting: Nurse Practitioner

## 2021-08-15 ENCOUNTER — Other Ambulatory Visit: Payer: Self-pay

## 2021-08-15 VITALS — BP 140/88 | HR 98 | Temp 98.3°F | Resp 19 | Ht 69.0 in | Wt 168.8 lb

## 2021-08-15 DIAGNOSIS — C779 Secondary and unspecified malignant neoplasm of lymph node, unspecified: Secondary | ICD-10-CM | POA: Diagnosis not present

## 2021-08-15 DIAGNOSIS — D509 Iron deficiency anemia, unspecified: Secondary | ICD-10-CM | POA: Diagnosis not present

## 2021-08-15 DIAGNOSIS — C189 Malignant neoplasm of colon, unspecified: Secondary | ICD-10-CM | POA: Diagnosis not present

## 2021-08-15 DIAGNOSIS — C182 Malignant neoplasm of ascending colon: Secondary | ICD-10-CM | POA: Diagnosis not present

## 2021-08-15 DIAGNOSIS — F1010 Alcohol abuse, uncomplicated: Secondary | ICD-10-CM | POA: Insufficient documentation

## 2021-08-15 DIAGNOSIS — Z8042 Family history of malignant neoplasm of prostate: Secondary | ICD-10-CM | POA: Insufficient documentation

## 2021-08-15 DIAGNOSIS — Z79899 Other long term (current) drug therapy: Secondary | ICD-10-CM | POA: Insufficient documentation

## 2021-08-15 DIAGNOSIS — G40909 Epilepsy, unspecified, not intractable, without status epilepticus: Secondary | ICD-10-CM | POA: Diagnosis not present

## 2021-08-15 NOTE — Progress Notes (Signed)
I met with Joseph Haney and his wife during  his consultation with Cira Rue, NP and Dr Burr Medico.  I explained my role as a nurse navigator and provided my contact information. I explained the services provided at [facility] and provided written information.  I explained the alight grant and let him know one of the financial advisors will reach out to him at the time of his chemo education class.  I encouraged [name] to attend the chemo ed class with her husband.  I told him that he will be scheduled for chemotherapy education class prior to receiving chemotherapy.   All questions were answered.  She verbalized understanding.

## 2021-08-15 NOTE — Progress Notes (Addendum)
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 08/15/2021   Addendum I have seen the patient, examined him. I agree with the assessment and and plan and have edited the notes.   45 yo male with PMH of alcohol abuse, seizure disorder on disability, presented with bowel obstruction and severe anemia from iron deficiency.  Work-up showed near obstructing right colon mass.  I discussed his surgical pathology findings, which showed T4 lesion and 9 positive lymph nodes, surgical margins were negative.  MSI stable.  Staging scan was negative for distant metastasis.  I discussed high risk of recurrence due to his locally advanced disease, and recommend adjuvant chemotherapy Capox. He has had slow recovery from surgery, and wants to wait for additional 3 weeks before starting chemo.  We reviewed that the potential side effect of chemotherapy in great detail, chemo consent obtained.  We will arrange chemo class, IV iron in next few weeks. Genetic and dietician consult. F/u with first dose  chemo.  Truitt Merle  08/16/2021

## 2021-08-16 ENCOUNTER — Encounter: Payer: Self-pay | Admitting: Nurse Practitioner

## 2021-08-16 ENCOUNTER — Encounter: Payer: Self-pay | Admitting: Hematology

## 2021-08-16 MED ORDER — CAPECITABINE 500 MG PO TABS
850.0000 mg/m2 | ORAL_TABLET | Freq: Two times a day (BID) | ORAL | 1 refills | Status: DC
  Filled 2021-08-16: qty 84, 14d supply, fill #0

## 2021-08-16 MED ORDER — ONDANSETRON HCL 8 MG PO TABS: 8.0000 mg | ORAL_TABLET | Freq: Two times a day (BID) | ORAL | 1 refills | Status: DC | PRN

## 2021-08-16 MED ORDER — PROCHLORPERAZINE MALEATE 10 MG PO TABS: 10.0000 mg | ORAL_TABLET | Freq: Four times a day (QID) | ORAL | 1 refills | Status: DC | PRN

## 2021-08-16 NOTE — Progress Notes (Signed)
START ON PATHWAY REGIMEN - Colorectal     A cycle is every 21 days:     Capecitabine      Oxaliplatin   **Always confirm dose/schedule in your pharmacy ordering system**  Patient Characteristics: Postoperative without Neoadjuvant Therapy (Pathologic Staging), Colon, Stage III, High Risk (pT4 or pN2) Tumor Location: Colon Therapeutic Status: Postoperative without Neoadjuvant Therapy (Pathologic Staging) AJCC M Category: cM0 AJCC T Category: pT4a AJCC N Category: pN2a AJCC 8 Stage Grouping: IIIC Intent of Therapy: Curative Intent, Discussed with Patient

## 2021-08-19 ENCOUNTER — Telehealth: Payer: Self-pay | Admitting: Pharmacist

## 2021-08-19 ENCOUNTER — Telehealth: Payer: Self-pay

## 2021-08-19 ENCOUNTER — Encounter: Payer: Self-pay | Admitting: Nurse Practitioner

## 2021-08-19 ENCOUNTER — Other Ambulatory Visit (HOSPITAL_COMMUNITY): Payer: Self-pay

## 2021-08-19 ENCOUNTER — Encounter: Payer: Self-pay | Admitting: Hematology

## 2021-08-19 DIAGNOSIS — C189 Malignant neoplasm of colon, unspecified: Secondary | ICD-10-CM

## 2021-08-19 MED ORDER — CAPECITABINE 500 MG PO TABS
850.0000 mg/m2 | ORAL_TABLET | Freq: Two times a day (BID) | ORAL | 1 refills | Status: DC
  Filled 2021-08-19 – 2021-08-29 (×2): qty 84, 14d supply, fill #0
  Filled 2021-09-09: qty 84, 21d supply, fill #0
  Filled 2021-10-01: qty 84, 21d supply, fill #1

## 2021-08-19 NOTE — Telephone Encounter (Signed)
Oral Oncology Pharmacist Encounter  Received new prescription for Xeloda (capecitabine) for the adjuvant treatment of stage IIIC colon cancer in conjunction with oxaliplatin, planned duration ~6 months.  CBC w/ Diff and CMP from 08/05/21 assessed, no baseline dose adjustments required. Prescription dose and frequency assessed for appropriateness.   Current medication list in Epic reviewed, DDIs with Xeloda identified: Category C DDI between Xeloda and Omeprazole - proton-pump inhibitors can decrease efficacy of Xeloda - will discuss with patient alternatives to omeprazole, such as H2RA's like famotidine while on Xeloda.  Evaluated chart and no patient barriers to medication adherence noted.   Patient agreement for treatment documented in MD note on 08/15/21.  Prescription has been e-scribed to the [facility] for benefits analysis and approval.  Oral Oncology Clinic will continue to follow for insurance authorization, copayment issues, initial counseling and start date.  Leron Croak, PharmD, BCPS Hematology/Oncology Clinical Pharmacist [facility] and [place] [phone] 08/19/2021 8:39 AM

## 2021-08-19 NOTE — Telephone Encounter (Signed)
Oral Oncology Patient Advocate Encounter  After completing a benefits investigation, prior authorization for Xeloda is not required at this time through [place].  Patient's copay is $4.     [place] Patient [place] Phone [phone] Fax [phone] 08/19/2021 9:08 AM

## 2021-08-20 ENCOUNTER — Telehealth: Payer: Self-pay | Admitting: Hematology

## 2021-08-20 NOTE — Telephone Encounter (Signed)
Scheduled follow-up appointments per 12/15 los. Patient is aware and opted not to schedule genetic counseling at this time due to a lot of appointments being added to his schedule. Patient stated he is out of town until 12/27 and requested to receive his iron infusion the week after.

## 2021-08-21 ENCOUNTER — Other Ambulatory Visit: Payer: Self-pay

## 2021-08-21 ENCOUNTER — Other Ambulatory Visit: Payer: Medicaid Other

## 2021-08-21 NOTE — Progress Notes (Signed)
The proposed treatment discussed in conference is for discussion purpose only and is not a binding recommendation.  The patients have not been physically examined, or presented with their treatment options.  Therefore, final treatment plans cannot be decided.  

## 2021-08-23 ENCOUNTER — Other Ambulatory Visit: Payer: Medicaid Other

## 2021-08-23 ENCOUNTER — Ambulatory Visit: Payer: Medicaid Other

## 2021-08-23 ENCOUNTER — Telehealth: Payer: Self-pay | Admitting: Hematology

## 2021-08-23 NOTE — Telephone Encounter (Signed)
Sch per 12/16 los, pt aware °

## 2021-08-27 NOTE — Telephone Encounter (Signed)
Oral Chemotherapy Pharmacist Encounter   Attempted to reach patient to provide update and offer for initial counseling on oral medication: Xeloda (capecitabine).   No answer. Unable to leave voicemail for patient due to mailbox not being set up. Will attempt at different time to call patient back to discuss details of medication acquisition and initial counseling session   Leron Croak, PharmD, BCPS Hematology/Oncology Clinical Pharmacist [facility] [place] [phone] 08/27/2021 10:11 AM

## 2021-08-28 NOTE — Progress Notes (Signed)
Pharmacist Chemotherapy Monitoring - Initial Assessment    Anticipated start date: 09/05/21   The following has been reviewed per standard work regarding the patient's treatment regimen: The patient's diagnosis, treatment plan and drug doses, and organ/hematologic function Lab orders and baseline tests specific to treatment regimen  The treatment plan start date, drug sequencing, and pre-medications Prior authorization status  Patient's documented medication list, including drug-drug interaction screen and prescriptions for anti-emetics and supportive care specific to the treatment regimen The drug concentrations, fluid compatibility, administration routes, and timing of the medications to be used The patient's access for treatment and lifetime cumulative dose history, if applicable  The patient's medication allergies and previous infusion related reactions, if applicable   Changes made to treatment plan:  treatment plan date  Follow up needed:  N/A    Kennith Center, Pharm.D., CPP 08/28/2021@2 :47 PM

## 2021-08-28 NOTE — Telephone Encounter (Signed)
Oral Chemotherapy Pharmacist Encounter    2nd attempt made to reach patient to provide update and offer for initial counseling on oral medication: Xeloda (capecitabine).    No answer. Attempted to call both phone numbers on file. Unable to leave voicemail for patient due to mailbox not being set up on home phone number as well as voicemail box full on mobile phone number listed. Will attempt at different time to call patient back to discuss details of medication acquisition and initial counseling session.  Leron Croak, PharmD, BCPS Hematology/Oncology Clinical Pharmacist [facility] [place] [phone] 08/28/2021 11:39 AM

## 2021-08-29 ENCOUNTER — Other Ambulatory Visit (HOSPITAL_COMMUNITY): Payer: Self-pay

## 2021-08-29 ENCOUNTER — Other Ambulatory Visit: Payer: Medicaid Other

## 2021-08-29 NOTE — Telephone Encounter (Signed)
Oral Chemotherapy Pharmacist Encounter  I spoke with patient and patient's wife for overview of: Xeloda (capecitabine) for the adjuvant treatment of stage IIIC cancer in conjunction with infusional oxaliplatin, planned duration of ~6 cycles.  Counseled patient on administration, dosing, side effects, monitoring, drug-food interactions, safe handling, storage, and disposal.  Patient will take Xeloda 500mg  tablets, 3 tablets (1500mg ) by mouth in AM and 3 tabs (1500mg ) by mouth in PM, within 30 minutes of finishing meals, on days 1-14 of each 21 day cycle.   Oxaliplatin will be infused on day 1 of each 21 day cycle.  Xeloda and oxaliplatin start date: 09/11/21  Adverse effects include but are not limited to: fatigue, decreased blood counts, GI upset, diarrhea, mouth sores, and hand-foot syndrome.  Patient has anti-emetic on hand and knows to take it if nausea develops.   Patient will obtain anti diarrheal and alert the office of 4 or more loose stools above baseline.  Reviewed with patient importance of keeping a medication schedule and plan for any missed doses. No barriers to medication adherence identified.  Medication reconciliation performed and medication/allergy list updated. Discussed in detail drug-drug interaction between Xeloda and omperazole. Patient OK with switching to pepcid PRN while on Xeloda.   Insurance authorization for Xeloda has been obtained. Test claim at the pharmacy revealed copayment $4 for 1st fill of Xeloda. Patient will pick this up from the [facility]. Address provided to patient and patient's wife.   Patient informed the pharmacy will reach out 5-7 days prior to needing next fill of Xeloda to coordinate continued medication acquisition to prevent break in therapy.  All questions answered.  Mr. And Ms. Vipond voiced understanding and appreciation.   Medication education handout and medication calendar placed in mail for patient. Patient  knows to call the office with questions or concerns. Oral Chemotherapy Clinic phone number provided to patient.   Leron Croak, PharmD, BCPS Hematology/Oncology Clinical Pharmacist [facility] [place] [phone] 08/29/2021 11:21 AM

## 2021-09-03 ENCOUNTER — Ambulatory Visit: Payer: Medicaid Other

## 2021-09-04 NOTE — Progress Notes (Signed)
Pharmacist Chemotherapy Monitoring - Initial Assessment    Anticipated start date: 09/11/21    The following has been reviewed per standard work regarding the patient's treatment regimen: The patient's diagnosis, treatment plan and drug doses, and organ/hematologic function Lab orders and baseline tests specific to treatment regimen  The treatment plan start date, drug sequencing, and pre-medications Prior authorization status  Patient's documented medication list, including drug-drug interaction screen and prescriptions for anti-emetics and supportive care specific to the treatment regimen The drug concentrations, fluid compatibility, administration routes, and timing of the medications to be used The patient's access for treatment and lifetime cumulative dose history, if applicable  The patient's medication allergies and previous infusion related reactions, if applicable   Changes made to treatment plan:  N/A  Follow up needed:  1) Will receive Monoferric as well on 09/11/21. 2) F/u labs on 09/11/21   Acquanetta Belling, [facility], BCPS, BCOP 09/04/2021  4:48 PM

## 2021-09-05 ENCOUNTER — Ambulatory Visit: Payer: Medicaid Other | Admitting: Hematology

## 2021-09-05 ENCOUNTER — Encounter: Payer: Medicaid Other | Admitting: Nutrition

## 2021-09-05 ENCOUNTER — Other Ambulatory Visit: Payer: Medicaid Other

## 2021-09-05 ENCOUNTER — Ambulatory Visit: Payer: Medicaid Other

## 2021-09-06 ENCOUNTER — Other Ambulatory Visit (HOSPITAL_COMMUNITY): Payer: Self-pay

## 2021-09-09 ENCOUNTER — Other Ambulatory Visit: Payer: Self-pay

## 2021-09-09 ENCOUNTER — Other Ambulatory Visit (HOSPITAL_COMMUNITY): Payer: Self-pay

## 2021-09-09 ENCOUNTER — Inpatient Hospital Stay: Payer: Medicaid Other | Attending: Nurse Practitioner

## 2021-09-09 DIAGNOSIS — Z79899 Other long term (current) drug therapy: Secondary | ICD-10-CM | POA: Insufficient documentation

## 2021-09-09 DIAGNOSIS — D509 Iron deficiency anemia, unspecified: Secondary | ICD-10-CM | POA: Insufficient documentation

## 2021-09-09 DIAGNOSIS — C182 Malignant neoplasm of ascending colon: Secondary | ICD-10-CM | POA: Insufficient documentation

## 2021-09-09 DIAGNOSIS — F1010 Alcohol abuse, uncomplicated: Secondary | ICD-10-CM | POA: Insufficient documentation

## 2021-09-09 DIAGNOSIS — Z9049 Acquired absence of other specified parts of digestive tract: Secondary | ICD-10-CM | POA: Insufficient documentation

## 2021-09-09 DIAGNOSIS — G40909 Epilepsy, unspecified, not intractable, without status epilepticus: Secondary | ICD-10-CM | POA: Insufficient documentation

## 2021-09-09 DIAGNOSIS — Z5111 Encounter for antineoplastic chemotherapy: Secondary | ICD-10-CM | POA: Insufficient documentation

## 2021-09-10 MED FILL — Dexamethasone Sodium Phosphate Inj 100 MG/10ML: INTRAMUSCULAR | Qty: 1 | Status: AC

## 2021-09-11 ENCOUNTER — Inpatient Hospital Stay: Payer: Medicaid Other

## 2021-09-11 ENCOUNTER — Encounter: Payer: Self-pay | Admitting: Hematology

## 2021-09-11 ENCOUNTER — Inpatient Hospital Stay (HOSPITAL_BASED_OUTPATIENT_CLINIC_OR_DEPARTMENT_OTHER): Payer: Medicaid Other | Admitting: Hematology

## 2021-09-11 ENCOUNTER — Other Ambulatory Visit: Payer: Self-pay

## 2021-09-11 VITALS — BP 115/70 | HR 95 | Temp 98.4°F | Resp 18

## 2021-09-11 VITALS — BP 135/94 | HR 92 | Temp 98.9°F | Resp 18 | Ht 69.0 in | Wt 173.3 lb

## 2021-09-11 DIAGNOSIS — C189 Malignant neoplasm of colon, unspecified: Secondary | ICD-10-CM

## 2021-09-11 DIAGNOSIS — Z79899 Other long term (current) drug therapy: Secondary | ICD-10-CM | POA: Diagnosis not present

## 2021-09-11 DIAGNOSIS — C182 Malignant neoplasm of ascending colon: Secondary | ICD-10-CM

## 2021-09-11 DIAGNOSIS — Z9049 Acquired absence of other specified parts of digestive tract: Secondary | ICD-10-CM | POA: Diagnosis not present

## 2021-09-11 DIAGNOSIS — F1010 Alcohol abuse, uncomplicated: Secondary | ICD-10-CM | POA: Diagnosis not present

## 2021-09-11 DIAGNOSIS — D500 Iron deficiency anemia secondary to blood loss (chronic): Secondary | ICD-10-CM

## 2021-09-11 DIAGNOSIS — G40909 Epilepsy, unspecified, not intractable, without status epilepticus: Secondary | ICD-10-CM | POA: Diagnosis not present

## 2021-09-11 DIAGNOSIS — Z5111 Encounter for antineoplastic chemotherapy: Secondary | ICD-10-CM | POA: Diagnosis not present

## 2021-09-11 DIAGNOSIS — D509 Iron deficiency anemia, unspecified: Secondary | ICD-10-CM | POA: Diagnosis not present

## 2021-09-11 LAB — CMP (CANCER CENTER ONLY)
ALT: 9 U/L (ref 0–44)
AST: 11 U/L — ABNORMAL LOW (ref 15–41)
Albumin: 4 g/dL (ref 3.5–5.0)
Alkaline Phosphatase: 79 U/L (ref 38–126)
Anion gap: 9 (ref 5–15)
BUN: 13 mg/dL (ref 6–20)
CO2: 28 mmol/L (ref 22–32)
Calcium: 9.1 mg/dL (ref 8.9–10.3)
Chloride: 104 mmol/L (ref 98–111)
Creatinine: 0.94 mg/dL (ref 0.61–1.24)
GFR, Estimated: >60 mL/min (ref >60)
Glucose, Bld: 108 mg/dL — ABNORMAL HIGH (ref 70–99)
Potassium: 3.5 mmol/L (ref 3.5–5.1)
Sodium: 141 mmol/L (ref 135–145)
Total Bilirubin: 0.2 mg/dL — ABNORMAL LOW (ref 0.3–1.2)
Total Protein: 8.1 g/dL (ref 6.5–8.1)

## 2021-09-11 LAB — CBC WITH DIFFERENTIAL (CANCER CENTER ONLY)
Abs Immature Granulocytes: 0.02 10*3/uL (ref 0.00–0.07)
Basophils Absolute: 0 10*3/uL (ref 0.0–0.1)
Basophils Relative: 0 %
Eosinophils Absolute: 0.2 10*3/uL (ref 0.0–0.5)
Eosinophils Relative: 3 %
HCT: 35.7 % — ABNORMAL LOW (ref 39.0–52.0)
Hemoglobin: 10.6 g/dL — ABNORMAL LOW (ref 13.0–17.0)
Immature Granulocytes: 0 %
Lymphocytes Relative: 48 %
Lymphs Abs: 2.6 10*3/uL (ref 0.7–4.0)
MCH: 22.9 pg — ABNORMAL LOW (ref 26.0–34.0)
MCHC: 29.7 g/dL — ABNORMAL LOW (ref 30.0–36.0)
MCV: 77.1 fL — ABNORMAL LOW (ref 80.0–100.0)
Monocytes Absolute: 0.3 10*3/uL (ref 0.1–1.0)
Monocytes Relative: 5 %
Neutro Abs: 2.4 10*3/uL (ref 1.7–7.7)
Neutrophils Relative %: 44 %
Platelet Count: 332 10*3/uL (ref 150–400)
RBC: 4.63 MIL/uL (ref 4.22–5.81)
RDW: 24.5 % — ABNORMAL HIGH (ref 11.5–15.5)
WBC Count: 5.6 10*3/uL (ref 4.0–10.5)
nRBC: 0 % (ref 0.0–0.2)

## 2021-09-11 LAB — CEA (IN HOUSE-CHCC): CEA (CHCC-In House): 1.24 ng/mL (ref 0.00–5.00)

## 2021-09-11 MED ORDER — OXALIPLATIN CHEMO INJECTION 100 MG/20ML
130.0000 mg/m2 | Freq: Once | INTRAVENOUS | Status: AC
  Administered 2021-09-11: 250 mg via INTRAVENOUS
  Filled 2021-09-11: qty 40

## 2021-09-11 MED ORDER — PALONOSETRON HCL INJECTION 0.25 MG/5ML
0.2500 mg | Freq: Once | INTRAVENOUS | Status: AC
  Administered 2021-09-11: 0.25 mg via INTRAVENOUS
  Filled 2021-09-11: qty 5

## 2021-09-11 MED ORDER — SODIUM CHLORIDE 0.9 % IV SOLN
10.0000 mg | Freq: Once | INTRAVENOUS | Status: AC
  Administered 2021-09-11: 10 mg via INTRAVENOUS
  Filled 2021-09-11: qty 10

## 2021-09-11 MED ORDER — DEXTROSE 5 % IV SOLN: Freq: Once | INTRAVENOUS | Status: AC

## 2021-09-11 MED ORDER — SODIUM CHLORIDE 0.9 % IV SOLN
1000.0000 mg | Freq: Once | INTRAVENOUS | Status: AC
  Administered 2021-09-11: 1000 mg via INTRAVENOUS
  Filled 2021-09-11: qty 10

## 2021-09-11 MED ORDER — SODIUM CHLORIDE 0.9 % IV SOLN: Freq: Once | INTRAVENOUS | Status: AC

## 2021-09-11 NOTE — Progress Notes (Signed)
[place]   Telephone:([phone] Fax:([phone]) [phone]   Clinic Follow up Note   Patient Care Team: [facility] as PCP - General (Family Medicine) Ralene Ok, MD as Consulting Physician (General Surgery) Alla Feeling, NP as Nurse Practitioner (Nurse Practitioner) Truitt Merle, MD as Consulting Physician (Hematology)  Date of Service:  09/11/2021  CHIEF COMPLAINT: f/u of colon cancer  CURRENT THERAPY:  Adjuvant CAPOX, q3weeks, starting 09/11/21.  ASSESSMENT & PLAN:  Joseph Haney is a 46 y.o. male with   1. Moderately differentiated adenocarcinoma of ascending colon, pT4apN2bM0, stage IIIC, MMR normal, MSI-stable -he presented to ED on 07/19/21 with N/V and abdominal pain, and IDA. Work up showed adenocarcinoma in the ascending colon. Staging work up was negative for metastatic disease. Baseline CEA is normal.  -s/p partial colectomy by Dr. Rosendo Gros 07/23/21, final surgical path showed pT4a lesion with LVI and PNI, no perforation, and clear margins. There was metastatic disease in 9/19 LNs, pN2b -the recommendation is for adjuvant CAPOX to reduce his risk of recurrence. We reviewed CAPOX with oxaliplatin on day 1 together with Xeloda twice daily on days 1-14, then 1 week off every 21 days x6 months. He is scheduled to begin today. We reviewed potential side effects and what to expect. He voiced good understanding and agrees to proceed  -labs reviewed, CBC improving. He continues to have significant fatigue. He also reports pain and has run out of oxycodone. We will reach out to his pain management specialist, Dr. Primus Bravo. -I will see him next week for toxicity check.   2. Iron deficiency anemia, secondary to #1 -he presented to ED with acute on chronic anemia (hgb range 9-12 since 2017), with hgb 7 and ferritin 2 -he was started on oral iron, tolerating. He did not receive IV iron. -improved-- hgb 10.6 and plt 332k today (09/11/21)   3.  Seizure disorder and h/o alcohol abuse -on Keppra, no recent seizure in 2022.  -he quit drinking at age 67. I encouraged him to continue alcohol cessation   4. Social -patient is not working and does not drive due to seizure disorder.  -he has medicaid  -he has 3 children ages 69, 45, 50 -has strong family support with wife and mother    46. Genetics -patient is young with early onset ascending colon cancer. He has 6 brothers. We recommend they all undergo colonoscopies now if they have not done so. -he understands his children will need to begin screening at or before age 57, or sooner if he has genetic mutation -patient's disease is MMR normal, MSI-stable. Not likely lynch syndrome. However, due to his young age, and a cousin with prostate cancer in his 46's, we strongly encouraged him to undergo genetic testing, he agreed   6. Goals of care -the goal of adjuvant chemotherapy is curative.  -full code     PLAN: -proceed with first oxaliplatin today -start Xeloda today, will take morning dose here -we will reach out to Dr. Primus Bravo regarding his oxycodone refill  -toxicity check on 09/20/21 as scheduled   No problem-specific Assessment & Plan notes found for this encounter.   SUMMARY OF ONCOLOGIC HISTORY: Oncology History  Cancer of right colon ([place])  07/19/2021 Imaging   CT AP IMPRESSION: Focal area of high-grade narrowing of the proximal ascending colon may be sequela of chronic inflammation and stricture versus a mass. There is mild distension of the cecum and terminal ileum. Clinical correlation and further evaluation with lower  GI study or colonoscopy recommended.   07/22/2021 Procedure   Colonoscopy A severe stenosis was found in the distal ascending colon and was non-traversed. It was ulcerated and inflamed. This could be inflammatory versus malignant stricture.   07/22/2021 Initial Biopsy   FINAL MICROSCOPIC DIAGNOSIS:  A. COLON, ASCENDING, BIOPSY:  - Invasive  colorectal adenocarcinoma, moderately differentiated.    07/23/2021 Definitive Surgery   PRE-OPERATIVE DIAGNOSIS:  right colon mass POST-OPERATIVE DIAGNOSIS:  right colon mass PROCEDURE:  Procedure(s): XI ROBOT ASSISTED LAPAROSCOPIC RIGHT COLECTOMY (N/A) Dr. Rosendo Gros   07/23/2021 Pathology Results   FINAL MICROSCOPIC DIAGNOSIS:  A. COLON, RIGHT, RESECTION:  - Invasive moderately differentiated colonic adenocarcinoma, 4.5 cm.  - Carcinoma extends into pericolonic connective tissue and focally  involves serosal surface.  - Margins not involved.  - Metastatic carcinoma in nine of nineteen lymph nodes (9/19).  - Unremarkable appendix. MMR normal, MSI-stable    07/23/2021 Cancer Staging   Staging form: Colon and Rectum, AJCC 8th Edition - Pathologic stage from 07/23/2021: Stage IIIC (pT4a, pN2b, cM0) - Signed by Alla Feeling, NP on 08/15/2021 Stage prefix: Initial diagnosis Histologic grading system: 4 grade system Histologic grade (G): G2 Laterality: Right Lymph-vascular invasion (LVI): LVI present/identified, NOS Perineural invasion (PNI): Present Microsatellite instability (MSI): Stable    07/24/2021 Initial Diagnosis   Colon adenocarcinoma ([place])   07/25/2021 Tumor Marker   CEA: 2.9   07/28/2021 Imaging   CT chest IMPRESSION: 1. No evidence of metastatic disease within the chest. 2. Lungs demonstrate atelectasis, most evident lower lobes and bases of the left upper lobe lingula and right middle lobe. No convincing pneumonia and no evidence of pulmonary edema.     09/11/2021 -  Chemotherapy   Patient is on Treatment Plan : COLORECTAL Xelox (Capeox) q21d        INTERVAL HISTORY:  Joseph Haney is here for a follow up of colon cancer. He was last seen by me on 08/15/21 in consultation with NP Lacie. He presents to the clinic alone. He reports continued fatigue, still about 50% of his normal. He also notes some constipation, for which he takes colace or miralax.    All other systems were reviewed with the patient and are negative.  MEDICAL HISTORY:  Past Medical History:  Diagnosis Date   Alcohol abuse    GERD (gastroesophageal reflux disease)    Seizure ([place])     SURGICAL HISTORY: Past Surgical History:  Procedure Laterality Date   BIOPSY  07/22/2021   Procedure: BIOPSY;  Surgeon: [name], MD;  Location: [facility];  Service: Gastroenterology;;   COLONOSCOPY WITH PROPOFOL N/A 07/22/2021   Procedure: COLONOSCOPY WITH PROPOFOL;  Surgeon: [name], MD;  Location: [facility];  Service: Gastroenterology;  Laterality: N/A;   NO PAST SURGERIES     SUBMUCOSAL TATTOO INJECTION  07/22/2021   Procedure: SUBMUCOSAL TATTOO INJECTION;  Surgeon: [name], MD;  Location: [facility];  Service: Gastroenterology;;    I have reviewed the social history and family history with the patient and they are unchanged from previous note.  ALLERGIES:  is allergic to morphine and related.  MEDICATIONS:  Current Outpatient Medications  Medication Sig Dispense Refill   bacitracin ointment Apply 1 application topically 2 (two) times daily. 14 g 0   capecitabine (XELODA) 500 MG tablet Take 3 tablets (1,500 mg total) by mouth 2 (two) times daily after a meal. Take for 14 days on, 7 days off. Repeat every 21 days. 84 tablet 1   carvedilol (COREG) 25  MG tablet Take 1 tablet (25 mg total) by mouth 2 (two) times daily with a meal. 60 tablet 2   cyclobenzaprine (FLEXERIL) 10 MG tablet Take 10-20 mg by mouth 2 (two) times daily as needed for muscle spasms.     docusate sodium (COLACE) 100 MG capsule Take 100 mg by mouth daily. (Patient not taking: Reported on 07/20/2021)     ferrous sulfate 325 (65 FE) MG tablet Take 1 tablet (325 mg total) by mouth 2 (two) times daily with a meal. 100 tablet 3   gabapentin (NEURONTIN) 300 MG capsule Take 300 mg by mouth See admin instructions. Take 300 mg by mouth three to four times a day     methocarbamol  (ROBAXIN) 500 MG tablet Take 1 tablet (500 mg total) by mouth 2 (two) times daily. 20 tablet 0   NUCYNTA 75 MG tablet Take 75 mg by mouth See admin instructions. Take 75 mg by mouth four to five times a day as needed for pain     ondansetron (ZOFRAN) 8 MG tablet Take 1 tablet (8 mg total) by mouth 2 (two) times daily as needed for refractory nausea / vomiting. Start on day 3 after chemotherapy. 30 tablet 1   oxyCODONE (ROXICODONE) 15 MG immediate release tablet Take 15 mg by mouth every 4 (four) hours as needed for pain.     polyethylene glycol (MIRALAX / GLYCOLAX) 17 g packet Take 17 g by mouth daily as needed for mild constipation or moderate constipation. 14 each 0   prochlorperazine (COMPAZINE) 10 MG tablet Take 1 tablet (10 mg total) by mouth every 6 (six) hours as needed (Nausea or vomiting). 30 tablet 1   No current facility-administered medications for this visit.    PHYSICAL EXAMINATION: ECOG PERFORMANCE STATUS: 1 - Symptomatic but completely ambulatory  Vitals:   09/11/21 0930  BP: (!) 135/94  Pulse: 92  Resp: 18  Temp: 98.9 F (37.2 C)  SpO2: 99%   Wt Readings from Last 3 Encounters:  09/11/21 173 lb 4.8 oz (78.6 kg)  08/15/21 168 lb 12.8 oz (76.6 kg)  08/05/21 165 lb (74.8 kg)     GENERAL:alert, no distress and comfortable SKIN: skin color normal, no rashes or significant lesions EYES: normal, Conjunctiva are pink and non-injected, sclera clear  NEURO: alert & oriented x 3 with fluent speech  LABORATORY DATA:  I have reviewed the data as listed CBC Latest Ref Rng & Units 09/11/2021 08/05/2021 08/01/2021  WBC 4.0 - 10.5 K/uL 5.6 8.3 7.0  Hemoglobin 13.0 - 17.0 g/dL 10.6(L) 8.5(L) 7.5(L)  Hematocrit 39.0 - 52.0 % 35.7(L) 30.9(L) 26.0(L)  Platelets 150 - 400 K/uL 332 903(HH) 627(H)     CMP Latest Ref Rng & Units 09/11/2021 08/05/2021 08/01/2021  Glucose 70 - 99 mg/dL 108(H) 102(H) 100(H)  BUN 6 - 20 mg/dL 13 8 6   Creatinine 0.61 - 1.24 mg/dL 0.94 0.80 0.76  Sodium  135 - 145 mmol/L 141 138 135  Potassium 3.5 - 5.1 mmol/L 3.5 3.5 3.8  Chloride 98 - 111 mmol/L 104 105 103  CO2 22 - 32 mmol/L 28 26 25   Calcium 8.9 - 10.3 mg/dL 9.1 8.7(L) 8.2(L)  Total Protein 6.5 - 8.1 g/dL 8.1 8.5(H) -  Total Bilirubin 0.3 - 1.2 mg/dL 0.2(L) 0.4 -  Alkaline Phos 38 - 126 U/L 79 59 -  AST 15 - 41 U/L 11(L) 12(L) -  ALT 0 - 44 U/L 9 8 -      RADIOGRAPHIC STUDIES: I  have personally reviewed the radiological images as listed and agreed with the findings in the report. No results found.    No orders of the defined types were placed in this encounter.  All questions were answered. The patient knows to call the clinic with any problems, questions or concerns. No barriers to learning was detected. The total time spent in the appointment was 30 minutes.     Truitt Merle, MD 09/11/2021   I, Wilburn Mylar, am acting as scribe for Truitt Merle, MD.   I have reviewed the above documentation for accuracy and completeness, and I agree with the above.

## 2021-09-11 NOTE — Patient Instructions (Signed)
[facility]  Discharge Instructions: Thank you for choosing [facility] to provide your oncology and hematology care.   If you have a lab appointment with the [facility], please go directly to the [facility] and check in at the registration area.   Wear comfortable clothing and clothing appropriate for easy access to any Portacath or PICC line.   We strive to give you quality time with your provider. You may need to reschedule your appointment if you arrive late (15 or more minutes).  Arriving late affects you and other patients whose appointments are after yours.  Also, if you miss three or more appointments without notifying the office, you may be dismissed from the clinic at the providers discretion.      For prescription refill requests, have your pharmacy contact our office and allow 72 hours for refills to be completed.    Today you received the following chemotherapy and/or immunotherapy agents oxaliplatin      To help prevent nausea and vomiting after your treatment, we encourage you to take your nausea medication as directed.  BELOW ARE SYMPTOMS THAT SHOULD BE REPORTED IMMEDIATELY: *FEVER GREATER THAN 100.4 F (38 C) OR HIGHER *CHILLS OR SWEATING *NAUSEA AND VOMITING THAT IS NOT CONTROLLED WITH YOUR NAUSEA MEDICATION *UNUSUAL SHORTNESS OF BREATH *UNUSUAL BRUISING OR BLEEDING *URINARY PROBLEMS (pain or burning when urinating, or frequent urination) *BOWEL PROBLEMS (unusual diarrhea, constipation, pain near the anus) TENDERNESS IN MOUTH AND THROAT WITH OR WITHOUT PRESENCE OF ULCERS (sore throat, sores in mouth, or a toothache) UNUSUAL RASH, SWELLING OR PAIN  UNUSUAL VAGINAL DISCHARGE OR ITCHING   Items with * indicate a potential emergency and should be followed up as soon as possible or go to the Emergency Department if any problems should occur.  Please show the CHEMOTHERAPY ALERT CARD or IMMUNOTHERAPY ALERT CARD at check-in to  the Emergency Department and triage nurse.  Should you have questions after your visit or need to cancel or reschedule your appointment, please contact [facility]  Dept: [phone]  and follow the prompts.  Office hours are 8:00 a.m. to 4:30 p.m. Monday - Friday. Please note that voicemails left after 4:00 p.m. may not be returned until the following business day.  We are closed weekends and major holidays. You have access to a nurse at all times for urgent questions. Please call the main number to the clinic Dept: [phone] and follow the prompts.   For any non-urgent questions, you may also contact your provider using MyChart. We now offer e-Visits for anyone 48 and older to request care online for non-urgent symptoms. For details visit [email or web address].   Also download the MyChart app! Go to the app store, search "MyChart", open the app, select [facility], and log in with your MyChart username and password.  Due to Covid, a mask is required upon entering the hospital/clinic. If you do not have a mask, one will be given to you upon arrival. For doctor visits, patients may have 1 support person aged 17 or older with them. For treatment visits, patients cannot have anyone with them due to current Covid guidelines and our immunocompromised population.   Oxaliplatin Injection What is this medication? OXALIPLATIN (ox AL i PLA tin) is a chemotherapy drug. It targets fast dividing cells, like cancer cells, and causes these cells to die. This medicine is used to treat cancers of the colon and rectum, and many other cancers. This medicine may  be used for other purposes; ask your health care provider or pharmacist if you have questions. COMMON BRAND NAME(S): Eloxatin What should I tell my care team before I take this medication? They need to know if you have any of these conditions: heart disease history of irregular heartbeat liver disease low blood counts,  like white cells, platelets, or red blood cells lung or breathing disease, like asthma take medicines that treat or prevent blood clots tingling of the fingers or toes, or other nerve disorder an unusual or allergic reaction to oxaliplatin, other chemotherapy, other medicines, foods, dyes, or preservatives pregnant or trying to get pregnant breast-feeding How should I use this medication? This drug is given as an infusion into a vein. It is administered in a hospital or clinic by a specially trained health care professional. Talk to your pediatrician regarding the use of this medicine in children. Special care may be needed. Overdosage: If you think you have taken too much of this medicine contact a poison control center or emergency room at once. NOTE: This medicine is only for you. Do not share this medicine with others. What if I miss a dose? It is important not to miss a dose. Call your doctor or health care professional if you are unable to keep an appointment. What may interact with this medication? Do not take this medicine with any of the following medications: cisapride dronedarone pimozide thioridazine This medicine may also interact with the following medications: aspirin and aspirin-like medicines certain medicines that treat or prevent blood clots like warfarin, apixaban, dabigatran, and rivaroxaban cisplatin cyclosporine diuretics medicines for infection like acyclovir, adefovir, amphotericin B, bacitracin, cidofovir, foscarnet, ganciclovir, gentamicin, pentamidine, vancomycin NSAIDs, medicines for pain and inflammation, like ibuprofen or naproxen other medicines that prolong the QT interval (an abnormal heart rhythm) pamidronate zoledronic acid This list may not describe all possible interactions. Give your health care provider a list of all the medicines, herbs, non-prescription drugs, or dietary supplements you use. Also tell them if you smoke, drink alcohol, or use  illegal drugs. Some items may interact with your medicine. What should I watch for while using this medication? Your condition will be monitored carefully while you are receiving this medicine. You may need blood work done while you are taking this medicine. This medicine may make you feel generally unwell. This is not uncommon as chemotherapy can affect healthy cells as well as cancer cells. Report any side effects. Continue your course of treatment even though you feel ill unless your healthcare professional tells you to stop. This medicine can make you more sensitive to cold. Do not drink cold drinks or use ice. Cover exposed skin before coming in contact with cold temperatures or cold objects. When out in cold weather wear warm clothing and cover your mouth and nose to warm the air that goes into your lungs. Tell your doctor if you get sensitive to the cold. Do not become pregnant while taking this medicine or for 9 months after stopping it. Women should inform their health care professional if they wish to become pregnant or think they might be pregnant. Men should not father a child while taking this medicine and for 6 months after stopping it. There is potential for serious side effects to an unborn child. Talk to your health care professional for more information. Do not breast-feed a child while taking this medicine or for 3 months after stopping it. This medicine has caused ovarian failure in some women. This medicine may make  it more difficult to get pregnant. Talk to your health care professional if you are concerned about your fertility. This medicine has caused decreased sperm counts in some men. This may make it more difficult to father a child. Talk to your health care professional if you are concerned about your fertility. This medicine may increase your risk of getting an infection. Call your health care professional for advice if you get a fever, chills, or sore throat, or other symptoms  of a cold or flu. Do not treat yourself. Try to avoid being around people who are sick. Avoid taking medicines that contain aspirin, acetaminophen, ibuprofen, naproxen, or ketoprofen unless instructed by your health care professional. These medicines may hide a fever. Be careful brushing or flossing your teeth or using a toothpick because you may get an infection or bleed more easily. If you have any dental work done, tell your dentist you are receiving this medicine. What side effects may I notice from receiving this medication? Side effects that you should report to your doctor or health care professional as soon as possible: allergic reactions like skin rash, itching or hives, swelling of the face, lips, or tongue breathing problems cough low blood counts - this medicine may decrease the number of white blood cells, red blood cells, and platelets. You may be at increased risk for infections and bleeding nausea, vomiting pain, redness, or irritation at site where injected pain, tingling, numbness in the hands or feet signs and symptoms of bleeding such as bloody or black, tarry stools; red or dark brown urine; spitting up blood or brown material that looks like coffee grounds; red spots on the skin; unusual bruising or bleeding from the eyes, gums, or nose signs and symptoms of a dangerous change in heartbeat or heart rhythm like chest pain; dizziness; fast, irregular heartbeat; palpitations; feeling faint or lightheaded; falls signs and symptoms of infection like fever; chills; cough; sore throat; pain or trouble passing urine signs and symptoms of liver injury like dark yellow or brown urine; general ill feeling or flu-like symptoms; light-colored stools; loss of appetite; nausea; right upper belly pain; unusually weak or tired; yellowing of the eyes or skin signs and symptoms of low red blood cells or anemia such as unusually weak or tired; feeling faint or lightheaded; falls signs and symptoms  of muscle injury like dark urine; trouble passing urine or change in the amount of urine; unusually weak or tired; muscle pain; back pain Side effects that usually do not require medical attention (report to your doctor or health care professional if they continue or are bothersome): changes in taste diarrhea gas hair loss loss of appetite mouth sores This list may not describe all possible side effects. Call your doctor for medical advice about side effects. You may report side effects to FDA at 1-800-FDA-1088. Where should I keep my medication? This drug is given in a hospital or clinic and will not be stored at home. NOTE: This sheet is a summary. It may not cover all possible information. If you have questions about this medicine, talk to your doctor, pharmacist, or health care provider.  2022 Elsevier/Gold Standard (2021-05-07 00:00:00)

## 2021-09-12 ENCOUNTER — Telehealth: Payer: Self-pay | Admitting: *Deleted

## 2021-09-12 ENCOUNTER — Telehealth: Payer: Self-pay

## 2021-09-12 NOTE — Telephone Encounter (Signed)
-----   Message from [place], RN sent at 09/11/2021  4:10 PM EST ----- Regarding: Joseph Haney first time oxaliplatin follow up Pt was first time on 09/11/21, tolerated well, needs a call back

## 2021-09-12 NOTE — Telephone Encounter (Signed)
-----   Message from Evalee Jefferson, RN sent at 09/12/2021  9:17 AM EST ----- Please see below.  Thanks! ----- Message ----- From: Truitt Merle, MD Sent: 09/11/2021   8:32 PM EST To: Evalee Jefferson, RN  Please fax my office note to his pain specialist Dr. Primus Bravo and let his nurse know that he has run out of oxycodone and it they can move up his f/u appointment.  Thanks   Krista Blue

## 2021-09-12 NOTE — Telephone Encounter (Signed)
Joseph Haney states that he did well yesterday. He is drinking fluids well.  He reports that his jaw today is locking up when he is eating. He is experiencing some muscle aches. No difficulty breathing. He is possibly experiencing jaw spasms from the Oxaliplatin.  He is experiencing the numbness/pins and needles in his fingers when he got something out of the refrigerator today. Reminded him to wear gloves when going into the refrigerator/freezer. No cold beverages. He began with nausea this evening.  He took a compazine tablet ~40 min. ago.  It is beginning to help with the nausea.  Told him to call the office if the jaw spasms get worse and if he develops vomiting and is not controlled with the compazine. He is aware of the office number [phone].

## 2021-09-12 NOTE — Telephone Encounter (Signed)
Per Dr.[name], called Dr.Crisp, pt's pain management doctor to make aware to of ned for refill of oxycodone and f/u appt. Spoke with [place] and faxed office notes to [phone]. Confirmation fax was received

## 2021-09-13 ENCOUNTER — Ambulatory Visit: Payer: Medicaid Other | Admitting: Hematology

## 2021-09-13 ENCOUNTER — Other Ambulatory Visit: Payer: Medicaid Other

## 2021-09-20 ENCOUNTER — Inpatient Hospital Stay (HOSPITAL_BASED_OUTPATIENT_CLINIC_OR_DEPARTMENT_OTHER): Payer: Medicaid Other | Admitting: Hematology

## 2021-09-20 ENCOUNTER — Inpatient Hospital Stay: Payer: Medicaid Other

## 2021-09-20 ENCOUNTER — Other Ambulatory Visit: Payer: Self-pay

## 2021-09-20 VITALS — BP 123/87 | HR 87 | Temp 98.6°F | Resp 18 | Ht 69.0 in | Wt 168.5 lb

## 2021-09-20 DIAGNOSIS — Z79899 Other long term (current) drug therapy: Secondary | ICD-10-CM | POA: Diagnosis not present

## 2021-09-20 DIAGNOSIS — Z5111 Encounter for antineoplastic chemotherapy: Secondary | ICD-10-CM | POA: Diagnosis not present

## 2021-09-20 DIAGNOSIS — Z9049 Acquired absence of other specified parts of digestive tract: Secondary | ICD-10-CM | POA: Diagnosis not present

## 2021-09-20 DIAGNOSIS — G40909 Epilepsy, unspecified, not intractable, without status epilepticus: Secondary | ICD-10-CM | POA: Diagnosis not present

## 2021-09-20 DIAGNOSIS — D509 Iron deficiency anemia, unspecified: Secondary | ICD-10-CM | POA: Diagnosis not present

## 2021-09-20 DIAGNOSIS — C182 Malignant neoplasm of ascending colon: Secondary | ICD-10-CM | POA: Diagnosis not present

## 2021-09-20 DIAGNOSIS — F1010 Alcohol abuse, uncomplicated: Secondary | ICD-10-CM | POA: Diagnosis not present

## 2021-09-20 DIAGNOSIS — C189 Malignant neoplasm of colon, unspecified: Secondary | ICD-10-CM

## 2021-09-20 LAB — CBC WITH DIFFERENTIAL (CANCER CENTER ONLY)
Abs Immature Granulocytes: 0.03 10*3/uL (ref 0.00–0.07)
Basophils Absolute: 0 10*3/uL (ref 0.0–0.1)
Basophils Relative: 1 %
Eosinophils Absolute: 0.2 10*3/uL (ref 0.0–0.5)
Eosinophils Relative: 3 %
HCT: 37.7 % — ABNORMAL LOW (ref 39.0–52.0)
Hemoglobin: 11.5 g/dL — ABNORMAL LOW (ref 13.0–17.0)
Immature Granulocytes: 1 %
Lymphocytes Relative: 35 %
Lymphs Abs: 2.3 10*3/uL (ref 0.7–4.0)
MCH: 23.4 pg — ABNORMAL LOW (ref 26.0–34.0)
MCHC: 30.5 g/dL (ref 30.0–36.0)
MCV: 76.6 fL — ABNORMAL LOW (ref 80.0–100.0)
Monocytes Absolute: 0.5 10*3/uL (ref 0.1–1.0)
Monocytes Relative: 7 %
Neutro Abs: 3.6 10*3/uL (ref 1.7–7.7)
Neutrophils Relative %: 53 %
Platelet Count: 215 10*3/uL (ref 150–400)
RBC: 4.92 MIL/uL (ref 4.22–5.81)
RDW: 22.5 % — ABNORMAL HIGH (ref 11.5–15.5)
WBC Count: 6.5 10*3/uL (ref 4.0–10.5)
nRBC: 0 % (ref 0.0–0.2)

## 2021-09-20 LAB — CMP (CANCER CENTER ONLY)
ALT: 22 U/L (ref 0–44)
AST: 28 U/L (ref 15–41)
Albumin: 4.3 g/dL (ref 3.5–5.0)
Alkaline Phosphatase: 84 U/L (ref 38–126)
Anion gap: 8 (ref 5–15)
BUN: 12 mg/dL (ref 6–20)
CO2: 27 mmol/L (ref 22–32)
Calcium: 9.5 mg/dL (ref 8.9–10.3)
Chloride: 104 mmol/L (ref 98–111)
Creatinine: 0.99 mg/dL (ref 0.61–1.24)
GFR, Estimated: >60 mL/min (ref >60)
Glucose, Bld: 121 mg/dL — ABNORMAL HIGH (ref 70–99)
Potassium: 3.8 mmol/L (ref 3.5–5.1)
Sodium: 139 mmol/L (ref 135–145)
Total Bilirubin: 0.4 mg/dL (ref 0.3–1.2)
Total Protein: 8.8 g/dL — ABNORMAL HIGH (ref 6.5–8.1)

## 2021-09-20 NOTE — Progress Notes (Signed)
[facility]   Telephone:([phone]) [phone] Fax:(336) [phone]   Clinic Follow up Note   Patient Care Team: [facility] as PCP - General (Family Medicine) Ralene Ok, MD as Consulting Physician (General Surgery) Alla Feeling, NP as Nurse Practitioner (Nurse Practitioner) Truitt Merle, MD as Consulting Physician (Hematology) Mohammed Kindle, MD as Referring Physician (Pain Medicine)  Date of Service:  09/20/2021  CHIEF COMPLAINT: f/u of colon cancer  CURRENT THERAPY:  Adjuvant CAPOX, q3weeks, starting 09/11/21.  ASSESSMENT & PLAN:  Joseph Haney is a 46 y.o. male with   1. Moderately differentiated adenocarcinoma of ascending colon, pT4apN2bM0, stage IIIC, MMR normal, MSI-stable -he presented to ED on 07/19/21 with N/V and abdominal pain, and IDA. Work up showed adenocarcinoma in the ascending colon. Staging work up was negative for metastatic disease. Baseline CEA is normal.  -s/p partial colectomy by Dr. Rosendo Gros 07/23/21, final surgical path showed pT4a lesion with LVI and PNI, no perforation, and clear margins. There was metastatic disease in 9/19 LNs, pN2b -the recommendation is for adjuvant CAPOX to reduce his risk of recurrence. We reviewed CAPOX with oxaliplatin on day 1 together with Xeloda twice daily on days 1-14, then 1 week off every 21 days x6 months. He began on 09/11/21. -labs reviewed, CBC still improving. He tolerated cycle 1 well overall    2. Iron deficiency anemia, secondary to #1 -he presented to ED with acute on chronic anemia (hgb range 9-12 since 2017), with hgb 7 and ferritin 2 -he was started on oral iron, tolerating. He did not receive IV iron. -improved-- hgb 11.5 and plt 215 today (09/20/21)   3. Seizure disorder and h/o alcohol abuse -on Keppra, no recent seizure in 2022.  -he quit drinking at age 62. I encouraged him to continue alcohol cessation   4. Social -patient is not working and does not drive due to  seizure disorder.  -he has medicaid  -he has 3 children ages 2, 81, 47 -has strong family support with wife and mother    97. Genetics -patient is young with early onset ascending colon cancer. He has 6 brothers. We recommend they all undergo colonoscopies now if they have not done so. -he understands his children will need to begin screening at or before age 39, or sooner if he has genetic mutation -patient's disease is MMR normal, MSI-stable. Not likely lynch syndrome. However, due to his young age, and a cousin with prostate cancer in his 64's, we strongly encouraged him to undergo genetic testing, he agreed   6. Goals of care -the goal of adjuvant chemotherapy is curative.  -full code      PLAN: -f/u with Dr. Primus Bravo for pain management, scheduled 09/24/21 per pt -lab, f/u, and C2 CAPOX on 10/02/21 as scheduled   No problem-specific Assessment & Plan notes found for this encounter.   SUMMARY OF ONCOLOGIC HISTORY: Oncology History  Cancer of right colon ([facility])  07/19/2021 Imaging   CT AP IMPRESSION: Focal area of high-grade narrowing of the proximal ascending colon may be sequela of chronic inflammation and stricture versus a mass. There is mild distension of the cecum and terminal ileum. Clinical correlation and further evaluation with lower GI study or colonoscopy recommended.   07/22/2021 Procedure   Colonoscopy A severe stenosis was found in the distal ascending colon and was non-traversed. It was ulcerated and inflamed. This could be inflammatory versus malignant stricture.   07/22/2021 Initial Biopsy   FINAL MICROSCOPIC DIAGNOSIS:  A. COLON,  ASCENDING, BIOPSY:  - Invasive colorectal adenocarcinoma, moderately differentiated.    07/23/2021 Definitive Surgery   PRE-OPERATIVE DIAGNOSIS:  right colon mass POST-OPERATIVE DIAGNOSIS:  right colon mass PROCEDURE:  Procedure(s): XI ROBOT ASSISTED LAPAROSCOPIC RIGHT COLECTOMY (N/A) Dr. Rosendo Gros   07/23/2021 Pathology Results    FINAL MICROSCOPIC DIAGNOSIS:  A. COLON, RIGHT, RESECTION:  - Invasive moderately differentiated colonic adenocarcinoma, 4.5 cm.  - Carcinoma extends into pericolonic connective tissue and focally  involves serosal surface.  - Margins not involved.  - Metastatic carcinoma in nine of nineteen lymph nodes (9/19).  - Unremarkable appendix. MMR normal, MSI-stable    07/23/2021 Cancer Staging   Staging form: Colon and Rectum, AJCC 8th Edition - Pathologic stage from 07/23/2021: Stage IIIC (pT4a, pN2b, cM0) - Signed by Alla Feeling, NP on 08/15/2021 Stage prefix: Initial diagnosis Histologic grading system: 4 grade system Histologic grade (G): G2 Laterality: Right Lymph-vascular invasion (LVI): LVI present/identified, NOS Perineural invasion (PNI): Present Microsatellite instability (MSI): Stable    07/24/2021 Initial Diagnosis   Colon adenocarcinoma ([facility])   07/25/2021 Tumor Marker   CEA: 2.9   07/28/2021 Imaging   CT chest IMPRESSION: 1. No evidence of metastatic disease within the chest. 2. Lungs demonstrate atelectasis, most evident lower lobes and bases of the left upper lobe lingula and right middle lobe. No convincing pneumonia and no evidence of pulmonary edema.     09/11/2021 -  Chemotherapy   Patient is on Treatment Plan : COLORECTAL Xelox (Capeox) q21d        INTERVAL HISTORY:  Joseph Haney is here for a follow up of colon cancer. He was last seen by me on 09/11/21. He presents to the clinic accompanied by his wife.   All other systems were reviewed with the patient and are negative.  MEDICAL HISTORY:  Past Medical History:  Diagnosis Date   Alcohol abuse    GERD (gastroesophageal reflux disease)    Seizure ([facility])     SURGICAL HISTORY: Past Surgical History:  Procedure Laterality Date   BIOPSY  07/22/2021   Procedure: BIOPSY;  Surgeon: [name], MD;  Location: [facility];  Service: Gastroenterology;;   COLONOSCOPY WITH PROPOFOL N/A  07/22/2021   Procedure: COLONOSCOPY WITH PROPOFOL;  Surgeon: [name], MD;  Location: [facility];  Service: Gastroenterology;  Laterality: N/A;   NO PAST SURGERIES     SUBMUCOSAL TATTOO INJECTION  07/22/2021   Procedure: SUBMUCOSAL TATTOO INJECTION;  Surgeon: [name], MD;  Location: [facility];  Service: Gastroenterology;;    I have reviewed the social history and family history with the patient and they are unchanged from previous note.  ALLERGIES:  is allergic to morphine and related.  MEDICATIONS:  Current Outpatient Medications  Medication Sig Dispense Refill   bacitracin ointment Apply 1 application topically 2 (two) times daily. 14 g 0   capecitabine (XELODA) 500 MG tablet Take 3 tablets (1,500 mg total) by mouth 2 (two) times daily after a meal. Take for 14 days on, 7 days off. Repeat every 21 days. 84 tablet 1   carvedilol (COREG) 25 MG tablet Take 1 tablet (25 mg total) by mouth 2 (two) times daily with a meal. 60 tablet 2   cyclobenzaprine (FLEXERIL) 10 MG tablet Take 10-20 mg by mouth 2 (two) times daily as needed for muscle spasms.     docusate sodium (COLACE) 100 MG capsule Take 100 mg by mouth daily. (Patient not taking: Reported on 07/20/2021)     ferrous sulfate 325 (65 FE) MG tablet  Take 1 tablet (325 mg total) by mouth 2 (two) times daily with a meal. 100 tablet 3   gabapentin (NEURONTIN) 300 MG capsule Take 300 mg by mouth See admin instructions. Take 300 mg by mouth three to four times a day     methocarbamol (ROBAXIN) 500 MG tablet Take 1 tablet (500 mg total) by mouth 2 (two) times daily. 20 tablet 0   NUCYNTA 75 MG tablet Take 75 mg by mouth See admin instructions. Take 75 mg by mouth four to five times a day as needed for pain     ondansetron (ZOFRAN) 8 MG tablet Take 1 tablet (8 mg total) by mouth 2 (two) times daily as needed for refractory nausea / vomiting. Start on day 3 after chemotherapy. 30 tablet 1   oxyCODONE (ROXICODONE) 15 MG  immediate release tablet Take 15 mg by mouth every 4 (four) hours as needed for pain.     polyethylene glycol (MIRALAX / GLYCOLAX) 17 g packet Take 17 g by mouth daily as needed for mild constipation or moderate constipation. 14 each 0   prochlorperazine (COMPAZINE) 10 MG tablet Take 1 tablet (10 mg total) by mouth every 6 (six) hours as needed (Nausea or vomiting). 30 tablet 1   No current facility-administered medications for this visit.    PHYSICAL EXAMINATION: ECOG PERFORMANCE STATUS: 1 - Symptomatic but completely ambulatory  There were no vitals filed for this visit. Wt Readings from Last 3 Encounters:  09/11/21 173 lb 4.8 oz (78.6 kg)  08/15/21 168 lb 12.8 oz (76.6 kg)  08/05/21 165 lb (74.8 kg)     GENERAL:alert, no distress and comfortable SKIN: skin color normal, no rashes or significant lesions EYES: normal, Conjunctiva are pink and non-injected, sclera clear  NEURO: alert & oriented x 3 with fluent speech  LABORATORY DATA:  I have reviewed the data as listed CBC Latest Ref Rng & Units 09/20/2021 09/11/2021 08/05/2021  WBC 4.0 - 10.5 K/uL 6.5 5.6 8.3  Hemoglobin 13.0 - 17.0 g/dL 11.5(L) 10.6(L) 8.5(L)  Hematocrit 39.0 - 52.0 % 37.7(L) 35.7(L) 30.9(L)  Platelets 150 - 400 K/uL 215 332 903(HH)     CMP Latest Ref Rng & Units 09/11/2021 08/05/2021 08/01/2021  Glucose 70 - 99 mg/dL 108(H) 102(H) 100(H)  BUN 6 - 20 mg/dL _0 Creatinine 0.61 - 1.24 mg/dL 0.94 0.80 0.76  Sodium 135 - 145 mmol/L 141 138 135  Potassium 3.5 - 5.1 mmol/L 3.5 3.5 3.8  Chloride 98 - 111 mmol/L 104 105 103  CO2 22 - 32 mmol/L _1 Calcium 8.9 - 10.3 mg/dL 9.1 8.7(L) 8.2(L)  Total Protein 6.5 - 8.1 g/dL 8.1 8.5(H) -  Total Bilirubin 0.3 - 1.2 mg/dL 0.2(L) 0.4 -  Alkaline Phos 38 - 126 U/L 79 59 -  AST 15 - 41 U/L 11(L) 12(L) -  ALT 0 - 44 U/L 9 8 -      RADIOGRAPHIC STUDIES: I have personally reviewed the radiological images as listed and agreed with the findings in the report. No  results found.    No orders of the defined types were placed in this encounter.  All questions were answered. The patient knows to call the clinic with any problems, questions or concerns. No barriers to learning was detected. The total time spent in the appointment was 20 minutes.     Truitt Merle, MD 09/20/2021   I, Wilburn Mylar, am acting as scribe for Truitt Merle, MD.   I have  reviewed the above documentation for accuracy and completeness, and I agree with the above.

## 2021-09-21 ENCOUNTER — Encounter: Payer: Self-pay | Admitting: Hematology

## 2021-09-21 ENCOUNTER — Encounter: Payer: Self-pay | Admitting: Nurse Practitioner

## 2021-09-23 ENCOUNTER — Other Ambulatory Visit (HOSPITAL_COMMUNITY): Payer: Self-pay

## 2021-09-24 DIAGNOSIS — G894 Chronic pain syndrome: Secondary | ICD-10-CM | POA: Diagnosis not present

## 2021-09-24 DIAGNOSIS — M5416 Radiculopathy, lumbar region: Secondary | ICD-10-CM | POA: Diagnosis not present

## 2021-09-24 DIAGNOSIS — G8929 Other chronic pain: Secondary | ICD-10-CM | POA: Diagnosis not present

## 2021-09-24 DIAGNOSIS — M5136 Other intervertebral disc degeneration, lumbar region: Secondary | ICD-10-CM | POA: Diagnosis not present

## 2021-10-01 ENCOUNTER — Encounter: Payer: Self-pay | Admitting: Hematology

## 2021-10-01 ENCOUNTER — Other Ambulatory Visit (HOSPITAL_COMMUNITY): Payer: Self-pay

## 2021-10-01 MED FILL — Dexamethasone Sodium Phosphate Inj 100 MG/10ML: INTRAMUSCULAR | Qty: 1 | Status: AC

## 2021-10-01 NOTE — Progress Notes (Signed)
Called pt to introduce myself as his Arboriculturist and to discuss the [employer].  Pt's insurance should pay for his treatment at 100% so copay assistance shouldn't be needed.  Pt was unavailable so I spoke w/ his fiance' and informed her of the [employer] and went over what it covers.  She said pt would like to apply so he will bring his proof of income on 10/02/21.  If approved I will give him an expense sheet and my card for any questions and concerns he may have in the future.

## 2021-10-02 ENCOUNTER — Inpatient Hospital Stay: Payer: Medicaid Other | Attending: Nurse Practitioner

## 2021-10-02 ENCOUNTER — Other Ambulatory Visit: Payer: Self-pay

## 2021-10-02 ENCOUNTER — Other Ambulatory Visit (HOSPITAL_COMMUNITY): Payer: Self-pay

## 2021-10-02 ENCOUNTER — Inpatient Hospital Stay (HOSPITAL_BASED_OUTPATIENT_CLINIC_OR_DEPARTMENT_OTHER): Payer: Medicaid Other | Admitting: Nurse Practitioner

## 2021-10-02 ENCOUNTER — Inpatient Hospital Stay: Payer: Medicaid Other

## 2021-10-02 ENCOUNTER — Inpatient Hospital Stay: Payer: Medicaid Other | Admitting: Dietician

## 2021-10-02 ENCOUNTER — Encounter: Payer: Self-pay | Admitting: Nurse Practitioner

## 2021-10-02 VITALS — BP 126/87 | HR 87 | Temp 98.1°F | Resp 17 | Wt 170.2 lb

## 2021-10-02 DIAGNOSIS — K219 Gastro-esophageal reflux disease without esophagitis: Secondary | ICD-10-CM | POA: Diagnosis not present

## 2021-10-02 DIAGNOSIS — D509 Iron deficiency anemia, unspecified: Secondary | ICD-10-CM | POA: Diagnosis not present

## 2021-10-02 DIAGNOSIS — R059 Cough, unspecified: Secondary | ICD-10-CM | POA: Diagnosis not present

## 2021-10-02 DIAGNOSIS — C182 Malignant neoplasm of ascending colon: Secondary | ICD-10-CM

## 2021-10-02 DIAGNOSIS — Z5111 Encounter for antineoplastic chemotherapy: Secondary | ICD-10-CM | POA: Insufficient documentation

## 2021-10-02 DIAGNOSIS — G629 Polyneuropathy, unspecified: Secondary | ICD-10-CM | POA: Insufficient documentation

## 2021-10-02 DIAGNOSIS — Z79899 Other long term (current) drug therapy: Secondary | ICD-10-CM | POA: Insufficient documentation

## 2021-10-02 DIAGNOSIS — F1011 Alcohol abuse, in remission: Secondary | ICD-10-CM | POA: Diagnosis not present

## 2021-10-02 DIAGNOSIS — C189 Malignant neoplasm of colon, unspecified: Secondary | ICD-10-CM

## 2021-10-02 DIAGNOSIS — R109 Unspecified abdominal pain: Secondary | ICD-10-CM | POA: Insufficient documentation

## 2021-10-02 DIAGNOSIS — G40909 Epilepsy, unspecified, not intractable, without status epilepticus: Secondary | ICD-10-CM | POA: Diagnosis not present

## 2021-10-02 LAB — CBC WITH DIFFERENTIAL (CANCER CENTER ONLY)
Abs Immature Granulocytes: 0.03 10*3/uL (ref 0.00–0.07)
Basophils Absolute: 0 10*3/uL (ref 0.0–0.1)
Basophils Relative: 0 %
Eosinophils Absolute: 0.1 10*3/uL (ref 0.0–0.5)
Eosinophils Relative: 1 %
HCT: 37.9 % — ABNORMAL LOW (ref 39.0–52.0)
Hemoglobin: 11.7 g/dL — ABNORMAL LOW (ref 13.0–17.0)
Immature Granulocytes: 1 %
Lymphocytes Relative: 40 %
Lymphs Abs: 2.1 10*3/uL (ref 0.7–4.0)
MCH: 24.5 pg — ABNORMAL LOW (ref 26.0–34.0)
MCHC: 30.9 g/dL (ref 30.0–36.0)
MCV: 79.5 fL — ABNORMAL LOW (ref 80.0–100.0)
Monocytes Absolute: 0.4 10*3/uL (ref 0.1–1.0)
Monocytes Relative: 8 %
Neutro Abs: 2.6 10*3/uL (ref 1.7–7.7)
Neutrophils Relative %: 50 %
Platelet Count: 323 10*3/uL (ref 150–400)
RBC: 4.77 MIL/uL (ref 4.22–5.81)
RDW: 24.4 % — ABNORMAL HIGH (ref 11.5–15.5)
WBC Count: 5.2 10*3/uL (ref 4.0–10.5)
nRBC: 0 % (ref 0.0–0.2)

## 2021-10-02 LAB — CMP (CANCER CENTER ONLY)
ALT: 21 U/L (ref 0–44)
AST: 19 U/L (ref 15–41)
Albumin: 4.2 g/dL (ref 3.5–5.0)
Alkaline Phosphatase: 93 U/L (ref 38–126)
Anion gap: 7 (ref 5–15)
BUN: 8 mg/dL (ref 6–20)
CO2: 28 mmol/L (ref 22–32)
Calcium: 9.2 mg/dL (ref 8.9–10.3)
Chloride: 104 mmol/L (ref 98–111)
Creatinine: 0.87 mg/dL (ref 0.61–1.24)
GFR, Estimated: >60 mL/min (ref >60)
Glucose, Bld: 105 mg/dL — ABNORMAL HIGH (ref 70–99)
Potassium: 3.8 mmol/L (ref 3.5–5.1)
Sodium: 139 mmol/L (ref 135–145)
Total Bilirubin: 0.3 mg/dL (ref 0.3–1.2)
Total Protein: 8.5 g/dL — ABNORMAL HIGH (ref 6.5–8.1)

## 2021-10-02 MED ORDER — CAPECITABINE 500 MG PO TABS
850.0000 mg/m2 | ORAL_TABLET | Freq: Two times a day (BID) | ORAL | 1 refills | Status: DC
  Filled 2021-10-02 – 2021-10-07 (×2): qty 84, 14d supply, fill #0
  Filled 2021-10-31: qty 84, 21d supply, fill #1

## 2021-10-02 MED ORDER — DEXTROSE 5 % IV SOLN: Freq: Once | INTRAVENOUS | Status: AC

## 2021-10-02 MED ORDER — OXALIPLATIN CHEMO INJECTION 100 MG/20ML
130.0000 mg/m2 | Freq: Once | INTRAVENOUS | Status: AC
  Administered 2021-10-02: 250 mg via INTRAVENOUS
  Filled 2021-10-02: qty 40

## 2021-10-02 MED ORDER — PALONOSETRON HCL INJECTION 0.25 MG/5ML
0.2500 mg | Freq: Once | INTRAVENOUS | Status: AC
  Administered 2021-10-02: 0.25 mg via INTRAVENOUS
  Filled 2021-10-02: qty 5

## 2021-10-02 MED ORDER — SODIUM CHLORIDE 0.9 % IV SOLN
10.0000 mg | Freq: Once | INTRAVENOUS | Status: AC
  Administered 2021-10-02: 10 mg via INTRAVENOUS
  Filled 2021-10-02: qty 10

## 2021-10-02 NOTE — Patient Instructions (Signed)
[facility]   ?Discharge Instructions: ?Thank you for choosing [facility] to provide your oncology and hematology care.  ? ?If you have a lab appointment with the Cancer Center, please go directly to the Cancer Center and check in at the registration area. ?  ?Wear comfortable clothing and clothing appropriate for easy access to any Portacath or PICC line.  ? ?We strive to give you quality time with your provider. You may need to reschedule your appointment if you arrive late (15 or more minutes).  Arriving late affects you and other patients whose appointments are after yours.  Also, if you miss three or more appointments without notifying the office, you may be dismissed from the clinic at the provider?s discretion.    ?  ?For prescription refill requests, have your pharmacy contact our office and allow 72 hours for refills to be completed.   ? ?Today you received the following chemotherapy and/or immunotherapy agents: oxaliplatin    ?  ?To help prevent nausea and vomiting after your treatment, we encourage you to take your nausea medication as directed. ? ?BELOW ARE SYMPTOMS THAT SHOULD BE REPORTED IMMEDIATELY: ?*FEVER GREATER THAN 100.4 F (38 ?C) OR HIGHER ?*CHILLS OR SWEATING ?*NAUSEA AND VOMITING THAT IS NOT CONTROLLED WITH YOUR NAUSEA MEDICATION ?*UNUSUAL SHORTNESS OF BREATH ?*UNUSUAL BRUISING OR BLEEDING ?*URINARY PROBLEMS (pain or burning when urinating, or frequent urination) ?*BOWEL PROBLEMS (unusual diarrhea, constipation, pain near the anus) ?TENDERNESS IN MOUTH AND THROAT WITH OR WITHOUT PRESENCE OF ULCERS (sore throat, sores in mouth, or a toothache) ?UNUSUAL RASH, SWELLING OR PAIN  ?UNUSUAL VAGINAL DISCHARGE OR ITCHING  ? ?Items with * indicate a potential emergency and should be followed up as soon as possible or go to the Emergency Department if any problems should occur. ? ?Please show the CHEMOTHERAPY ALERT CARD or IMMUNOTHERAPY ALERT CARD at check-in  to the Emergency Department and triage nurse. ? ?Should you have questions after your visit or need to cancel or reschedule your appointment, please contact [facility]  Dept: [phone]  and follow the prompts.  Office hours are 8:00 a.m. to 4:30 p.m. Monday - Friday. Please note that voicemails left after 4:00 p.m. may not be returned until the following business day.  We are closed weekends and major holidays. You have access to a nurse at all times for urgent questions. Please call the main number to the clinic Dept: [phone] and follow the prompts. ? ? ?For any non-urgent questions, you may also contact your provider using MyChart. We now offer e-Visits for anyone 18 and older to request care online for non-urgent symptoms. For details visit [email or web address]. ?  ?Also download the MyChart app! Go to the app store, search "MyChart", open the app, select [facility], and log in with your MyChart username and password. ? ?Due to Covid, a mask is required upon entering the hospital/clinic. If you do not have a mask, one will be given to you upon arrival. For doctor visits, patients may have 1 support person aged 18 or older with them. For treatment visits, patients cannot have anyone with them due to current Covid guidelines and our immunocompromised population.  ? ?

## 2021-10-02 NOTE — Progress Notes (Signed)
Nutrition Assessment   Reason for Assessment: MST   ASSESSMENT: 46 year old male with colorectal cancer. S/p partial colectomy 07/23/21. He is receiving adjuvant CAPOX  Met with patient during infusion. He reports mild cold sensitivity lasting ~week and a half after first treatment. Patient reports improved appetite. This was decreased after first learning of diagnosis. He reports mild nausea. Patient taking nausea medication as needed. Patient reports constipation after first treatment. This resolved with Colace, reports bowels are moving daily. Patient recalls drinking 1 1/2 bottles of water on "a good day" He drinks a lot of sweet tea. Patient tried Ensure supplement provided by infusion nurse. He liked this and plans to drink these for added nutrition.   Nutrition Focused Physical Exam: deferred   Medications: ferrous sulfate, gabapentin, zofran, roxicodone, miralax, flexeril    Labs: glucose 105  Anthropometrics:   Height: 5'9" Weight: 170 lb 3 oz UBW: 175 lb per pt BMI: 25.13   NUTRITION DIAGNOSIS: Food and nutrition related knowledge deficit related to cancer and associated treatments as evidenced by no prior need for related nutrition information    INTERVENTION:  Educated on importance of adequate calories and protein to maintain weight/strength Encouraged small frequent meals and snacks - handout provided Suggested drinking 1-2 Ensure Plus/equivalent daily as needed for added calories/protein - coupons provided Discussed alternate foods/beverages choices for cold sensitivity - handout provided  Discussed strategies for nausea, suggested pt wait 30 minutes after taking nausea medication before  Discussed importance of hydration, pt will work to increase intake of water Contact information provided    MONITORING, EVALUATION, GOAL: Patient will tolerate increased calories and protein to minimize weight loss    Next Visit: To be scheduled as needed with treatment

## 2021-10-02 NOTE — Progress Notes (Addendum)
[facility] [place]   Telephone:([phone] Fax:([phone]) [phone]   Clinic Follow up Note   Patient Care Team: [facility] as PCP - General (Family Medicine) Ralene Ok, MD as Consulting Physician (General Surgery) Alla Feeling, NP as Nurse Practitioner (Nurse Practitioner) Truitt Merle, MD as Consulting Physician (Hematology) Mohammed Kindle, MD as Referring Physician (Pain Medicine) 10/02/2021  CHIEF COMPLAINT: Follow up colon cancer   SUMMARY OF ONCOLOGIC HISTORY: Oncology History  Cancer of right colon ([place])  07/19/2021 Imaging   CT AP IMPRESSION: Focal area of high-grade narrowing of the proximal ascending colon may be sequela of chronic inflammation and stricture versus a mass. There is mild distension of the cecum and terminal ileum. Clinical correlation and further evaluation with lower GI study or colonoscopy recommended.   07/22/2021 Procedure   Colonoscopy A severe stenosis was found in the distal ascending colon and was non-traversed. It was ulcerated and inflamed. This could be inflammatory versus malignant stricture.   07/22/2021 Initial Biopsy   FINAL MICROSCOPIC DIAGNOSIS:  A. COLON, ASCENDING, BIOPSY:  - Invasive colorectal adenocarcinoma, moderately differentiated.    07/23/2021 Definitive Surgery   PRE-OPERATIVE DIAGNOSIS:  right colon mass POST-OPERATIVE DIAGNOSIS:  right colon mass PROCEDURE:  Procedure(s): XI ROBOT ASSISTED LAPAROSCOPIC RIGHT COLECTOMY (N/A) Dr. Rosendo Gros   07/23/2021 Pathology Results   FINAL MICROSCOPIC DIAGNOSIS:  A. COLON, RIGHT, RESECTION:  - Invasive moderately differentiated colonic adenocarcinoma, 4.5 cm.  - Carcinoma extends into pericolonic connective tissue and focally  involves serosal surface.  - Margins not involved.  - Metastatic carcinoma in nine of nineteen lymph nodes (9/19).  - Unremarkable appendix. MMR normal, MSI-stable    07/23/2021 Cancer Staging   Staging form:  Colon and Rectum, AJCC 8th Edition - Pathologic stage from 07/23/2021: Stage IIIC (pT4a, pN2b, cM0) - Signed by Alla Feeling, NP on 08/15/2021 Stage prefix: Initial diagnosis Histologic grading system: 4 grade system Histologic grade (G): G2 Laterality: Right Lymph-vascular invasion (LVI): LVI present/identified, NOS Perineural invasion (PNI): Present Microsatellite instability (MSI): Stable    07/24/2021 Initial Diagnosis   Colon adenocarcinoma ([place])   07/25/2021 Tumor Marker   CEA: 2.9   07/28/2021 Imaging   CT chest IMPRESSION: 1. No evidence of metastatic disease within the chest. 2. Lungs demonstrate atelectasis, most evident lower lobes and bases of the left upper lobe lingula and right middle lobe. No convincing pneumonia and no evidence of pulmonary edema.     09/11/2021 -  Chemotherapy   Patient is on Treatment Plan : COLORECTAL Xelox (Capeox) q21d       CURRENT THERAPY: Adjuvant CAPOX q3 weeks x6 months, starting 09/11/21  INTERVAL HISTORY: Mr. Amores returns for follow up as scheduled. Last seen 09/20/21 for toxicity check. He had cold sensitivity with tightness in his jaw with eating/drinking even room temp foods for 1-2 weeks after Oxali. 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 Denies mucositis. His feet started to peel on xeloda but he called pharmacy and got cream which helped. He had mild nausea without vomiting and 2-3 BMs daily while on xeloda. Takes anti-emetics before meals which helps. He has residual abdominal pain up to a 9/10 when using abd muscles such as a lot of talking or coughing. Denies fever, chills, cough, chest pain, dyspnea, leg edema. He has recovered on the week off and started cycle 2 this morning.  He is out of oxycodone, nucynta, flexeril, and gabapentin since around the time of his hospital discharge. Next pain mgmt visit in  3 weeks.    MEDICAL HISTORY:  Past Medical History:  Diagnosis Date   Alcohol abuse    GERD (gastroesophageal  reflux disease)    Seizure ([place])     SURGICAL HISTORY: Past Surgical History:  Procedure Laterality Date   BIOPSY  07/22/2021   Procedure: BIOPSY;  Surgeon: [name], MD;  Location: [facility];  Service: Gastroenterology;;   COLONOSCOPY WITH PROPOFOL N/A 07/22/2021   Procedure: COLONOSCOPY WITH PROPOFOL;  Surgeon: [name], MD;  Location: [facility];  Service: Gastroenterology;  Laterality: N/A;   NO PAST SURGERIES     SUBMUCOSAL TATTOO INJECTION  07/22/2021   Procedure: SUBMUCOSAL TATTOO INJECTION;  Surgeon: [name], MD;  Location: [facility];  Service: Gastroenterology;;    I have reviewed the social history and family history with the patient and they are unchanged from previous note.  ALLERGIES:  is allergic to morphine and related.  MEDICATIONS:  Current Outpatient Medications  Medication Sig Dispense Refill   bacitracin ointment Apply 1 application topically 2 (two) times daily. 14 g 0   capecitabine (XELODA) 500 MG tablet Take 3 tablets (1,500 mg total) by mouth 2 (two) times daily after a meal. Take for 14 days on, 7 days off. Repeat every 21 days. 84 tablet 1   carvedilol (COREG) 25 MG tablet Take 1 tablet (25 mg total) by mouth 2 (two) times daily with a meal. 60 tablet 2   cyclobenzaprine (FLEXERIL) 10 MG tablet Take 10-20 mg by mouth 2 (two) times daily as needed for muscle spasms.     docusate sodium (COLACE) 100 MG capsule Take 100 mg by mouth daily. (Patient not taking: Reported on 07/20/2021)     ferrous sulfate 325 (65 FE) MG tablet Take 1 tablet (325 mg total) by mouth 2 (two) times daily with a meal. 100 tablet 3   gabapentin (NEURONTIN) 300 MG capsule Take 300 mg by mouth See admin instructions. Take 300 mg by mouth three to four times a day     methocarbamol (ROBAXIN) 500 MG tablet Take 1 tablet (500 mg total) by mouth 2 (two) times daily. 20 tablet 0   NUCYNTA 75 MG tablet Take 75 mg by mouth See admin instructions. Take 75 mg by  mouth four to five times a day as needed for pain     ondansetron (ZOFRAN) 8 MG tablet Take 1 tablet (8 mg total) by mouth 2 (two) times daily as needed for refractory nausea / vomiting. Start on day 3 after chemotherapy. 30 tablet 1   oxyCODONE (ROXICODONE) 15 MG immediate release tablet Take 15 mg by mouth every 4 (four) hours as needed for pain.     polyethylene glycol (MIRALAX / GLYCOLAX) 17 g packet Take 17 g by mouth daily as needed for mild constipation or moderate constipation. 14 each 0   prochlorperazine (COMPAZINE) 10 MG tablet Take 1 tablet (10 mg total) by mouth every 6 (six) hours as needed (Nausea or vomiting). 30 tablet 1   No current facility-administered medications for this visit.    PHYSICAL EXAMINATION: ECOG PERFORMANCE STATUS: 1 - Symptomatic but completely ambulatory  Vitals:   10/02/21 1154  BP: 126/87  Pulse: 87  Resp: 17  Temp: 98.1 F (36.7 C)  SpO2: 100%   Filed Weights   10/02/21 1154  Weight: 170 lb 3 oz (77.2 kg)    GENERAL:alert, no distress and comfortable SKIN: no rash  EYES:  sclera clear LUNGS: clear with normal breathing effort HEART: regular rate &  rhythm, no lower extremity edema ABDOMEN:abdomen soft, non-tender and normal bowel sounds. Surgical incisions healed.  NEURO: alert & oriented x 3 with fluent speech, no focal motor deficits  LABORATORY DATA:  I have reviewed the data as listed CBC Latest Ref Rng & Units 10/02/2021 09/20/2021 09/11/2021  WBC 4.0 - 10.5 K/uL 5.2 6.5 5.6  Hemoglobin 13.0 - 17.0 g/dL 11.7(L) 11.5(L) 10.6(L)  Hematocrit 39.0 - 52.0 % 37.9(L) 37.7(L) 35.7(L)  Platelets 150 - 400 K/uL 323 215 332     CMP Latest Ref Rng & Units 10/02/2021 09/20/2021 09/11/2021  Glucose 70 - 99 mg/dL 105(H) 121(H) 108(H)  BUN 6 - 20 mg/dL 8 12 13   Creatinine 0.61 - 1.24 mg/dL 0.87 0.99 0.94  Sodium 135 - 145 mmol/L 139 139 141  Potassium 3.5 - 5.1 mmol/L 3.8 3.8 3.5  Chloride 98 - 111 mmol/L 104 104 104  CO2 22 - 32 mmol/L 28 27 28    Calcium 8.9 - 10.3 mg/dL 9.2 9.5 9.1  Total Protein 6.5 - 8.1 g/dL 8.5(H) 8.8(H) 8.1  Total Bilirubin 0.3 - 1.2 mg/dL 0.3 0.4 0.2(L)  Alkaline Phos 38 - 126 U/L 93 84 79  AST 15 - 41 U/L 19 28 11(L)  ALT 0 - 44 U/L 21 22 9       RADIOGRAPHIC STUDIES: I have personally reviewed the radiological images as listed and agreed with the findings in the report. No results found.   ASSESSMENT & PLAN: 46 yo male    Moderately differentiated adenocarcinoma of ascending colon, pT4apN2bM0, stage IIIC, MMR normal, MSI-stable -he presented with N/V and abdominal pain, and IDA. Work up showed adenocarcinoma in the ascending colon. Staging work up was negative for metastatic disease. Baseline CEA is normal.  -s/p partial colectomy by Dr. Rosendo Gros 07/23/21, final surgical path showed pT4a lesion with LVI and PNI, no perforation, and clear margins. There was metastatic disease in 9/19 LNs, pN2b -Due to the high risk for recurrence in stage IIIC colon, we recommend adjuvant chemotherapy. The goal of adjuvant chemotherapy is curative. -He began Capox 09/11/2021, s/p cycle 1   Iron deficiency anemia, secondary to #1 -he presented to ED with acute on chronic anemia (hgb range 9-12 since 2017), with hgb 7 and ferritin 2 -he was started on oral iron, tolerating. He did not receive IV iron in the hospital -He received Monoferric 09/11/2021, will schedule as needed in the future -Hgb improving   Seizure disorder and h/o alcohol abuse -on Keppra, no recent seizure in 2022.  -he quit drinking at age 28. I encouraged him to continue alcohol cessation   Social -patient is not working, and does not drive due to seizure disorder.  -he has medicaid  -he has 3 children ages 13, 37, 52 -has strong family support with wife and mother    Genetics -patient is young with early onset ascending colon cancer. He has 6 brothers. We recommend they all undergo colonoscopies now if they have not done so. -he understands his  children will need to begin screening at or before age 55, or sooner if he has genetic mutation -patient's disease is MMR normal, MSI-stable. Not likely lynch syndrome. However, due to his young age, and a cousin with prostate cancer in his 85's, we strongly encouraged him to undergo genetic testing, he agreed   Goals of care -the goal of adjuvant chemotherapy is curative.  -full code   Disposition: Mr. Sigg appears stable.  He completed 1 cycle of adjuvant Capox, tolerated well with  mild nausea, loose BM, skin peeling of his feet, and jaw tightness/cold sensitivity.  Side effects were adequately managed with supportive care at home. He was able to recover and function well after 1-2 weeks. We will follow-up on the refills of his routine meds - including gabapentin, flexeril, oxy, and nucynta - with pain specialist Dr. Primus Bravo.    Labs reviewed, adequate to proceed with cycle 2 Capox today as scheduled, same dose. He began Xeloda this morning. I refilled for next cycle.  F/up and cycle 3 in 3 weeks.   All questions were answered. The patient knows to call the clinic with any problems, questions or concerns. No barriers to learning were detected.     Alla Feeling, NP 10/02/21

## 2021-10-03 ENCOUNTER — Telehealth: Payer: Self-pay | Admitting: Nurse Practitioner

## 2021-10-03 ENCOUNTER — Other Ambulatory Visit (HOSPITAL_COMMUNITY): Payer: Self-pay

## 2021-10-03 NOTE — Telephone Encounter (Signed)
Scheduled follow-up appointment per 2/1 los. Patient is aware. °

## 2021-10-04 ENCOUNTER — Other Ambulatory Visit: Payer: Self-pay | Admitting: *Deleted

## 2021-10-04 MED ORDER — PROMETHAZINE HCL 25 MG PO TABS: 25.0000 mg | ORAL_TABLET | Freq: Four times a day (QID) | ORAL | 0 refills | Status: DC | PRN

## 2021-10-04 MED ORDER — DEXAMETHASONE 4 MG PO TABS: 4.0000 mg | ORAL_TABLET | Freq: Every day | ORAL | 0 refills | Status: DC

## 2021-10-04 NOTE — Progress Notes (Signed)
Patient has been having nausea and vomiting since treatment.  Unable to hold anything down.  Per Dr. Burr Medico, will send in Dexamethazone 4 mg daily for 5 days and Phenergan as needed.  Will assess to see if he needs to be seen today or next week.

## 2021-10-07 ENCOUNTER — Other Ambulatory Visit (HOSPITAL_COMMUNITY): Payer: Self-pay

## 2021-10-10 ENCOUNTER — Encounter: Payer: Self-pay | Admitting: Hematology

## 2021-10-10 NOTE — Progress Notes (Signed)
Pt is approved for the $1000 [employer] grant.  

## 2021-10-16 ENCOUNTER — Other Ambulatory Visit (HOSPITAL_COMMUNITY): Payer: Self-pay

## 2021-10-22 DIAGNOSIS — F419 Anxiety disorder, unspecified: Secondary | ICD-10-CM | POA: Diagnosis not present

## 2021-10-22 DIAGNOSIS — M545 Low back pain, unspecified: Secondary | ICD-10-CM | POA: Diagnosis not present

## 2021-10-22 DIAGNOSIS — C189 Malignant neoplasm of colon, unspecified: Secondary | ICD-10-CM | POA: Diagnosis not present

## 2021-10-22 DIAGNOSIS — F32A Depression, unspecified: Secondary | ICD-10-CM | POA: Diagnosis not present

## 2021-10-22 DIAGNOSIS — Z79899 Other long term (current) drug therapy: Secondary | ICD-10-CM | POA: Diagnosis not present

## 2021-10-22 DIAGNOSIS — G8929 Other chronic pain: Secondary | ICD-10-CM | POA: Diagnosis not present

## 2021-10-22 MED FILL — Dexamethasone Sodium Phosphate Inj 100 MG/10ML: INTRAMUSCULAR | Qty: 1 | Status: AC

## 2021-10-23 ENCOUNTER — Inpatient Hospital Stay (HOSPITAL_BASED_OUTPATIENT_CLINIC_OR_DEPARTMENT_OTHER): Payer: Medicaid Other | Admitting: Hematology

## 2021-10-23 ENCOUNTER — Other Ambulatory Visit: Payer: Self-pay

## 2021-10-23 ENCOUNTER — Encounter: Payer: Self-pay | Admitting: Hematology

## 2021-10-23 ENCOUNTER — Inpatient Hospital Stay (HOSPITAL_BASED_OUTPATIENT_CLINIC_OR_DEPARTMENT_OTHER): Payer: Medicaid Other

## 2021-10-23 ENCOUNTER — Inpatient Hospital Stay: Payer: Medicaid Other

## 2021-10-23 DIAGNOSIS — C189 Malignant neoplasm of colon, unspecified: Secondary | ICD-10-CM

## 2021-10-23 DIAGNOSIS — D509 Iron deficiency anemia, unspecified: Secondary | ICD-10-CM | POA: Diagnosis not present

## 2021-10-23 DIAGNOSIS — K219 Gastro-esophageal reflux disease without esophagitis: Secondary | ICD-10-CM | POA: Diagnosis not present

## 2021-10-23 DIAGNOSIS — R109 Unspecified abdominal pain: Secondary | ICD-10-CM | POA: Diagnosis not present

## 2021-10-23 DIAGNOSIS — Z5111 Encounter for antineoplastic chemotherapy: Secondary | ICD-10-CM | POA: Diagnosis not present

## 2021-10-23 DIAGNOSIS — C182 Malignant neoplasm of ascending colon: Secondary | ICD-10-CM

## 2021-10-23 DIAGNOSIS — G629 Polyneuropathy, unspecified: Secondary | ICD-10-CM | POA: Diagnosis not present

## 2021-10-23 DIAGNOSIS — Z79899 Other long term (current) drug therapy: Secondary | ICD-10-CM | POA: Diagnosis not present

## 2021-10-23 DIAGNOSIS — F1011 Alcohol abuse, in remission: Secondary | ICD-10-CM | POA: Diagnosis not present

## 2021-10-23 DIAGNOSIS — R059 Cough, unspecified: Secondary | ICD-10-CM | POA: Diagnosis not present

## 2021-10-23 DIAGNOSIS — G40909 Epilepsy, unspecified, not intractable, without status epilepticus: Secondary | ICD-10-CM | POA: Diagnosis not present

## 2021-10-23 LAB — CMP (CANCER CENTER ONLY)
ALT: 28 U/L (ref 0–44)
AST: 27 U/L (ref 15–41)
Albumin: 4 g/dL (ref 3.5–5.0)
Alkaline Phosphatase: 84 U/L (ref 38–126)
Anion gap: 9 (ref 5–15)
BUN: 8 mg/dL (ref 6–20)
CO2: 25 mmol/L (ref 22–32)
Calcium: 9.4 mg/dL (ref 8.9–10.3)
Chloride: 106 mmol/L (ref 98–111)
Creatinine: 0.88 mg/dL (ref 0.61–1.24)
GFR, Estimated: >60 mL/min (ref >60)
Glucose, Bld: 116 mg/dL — ABNORMAL HIGH (ref 70–99)
Potassium: 4 mmol/L (ref 3.5–5.1)
Sodium: 140 mmol/L (ref 135–145)
Total Bilirubin: 0.2 mg/dL — ABNORMAL LOW (ref 0.3–1.2)
Total Protein: 8.2 g/dL — ABNORMAL HIGH (ref 6.5–8.1)

## 2021-10-23 LAB — CBC WITH DIFFERENTIAL (CANCER CENTER ONLY)
Abs Immature Granulocytes: 0.03 10*3/uL (ref 0.00–0.07)
Basophils Absolute: 0 10*3/uL (ref 0.0–0.1)
Basophils Relative: 1 %
Eosinophils Absolute: 0 10*3/uL (ref 0.0–0.5)
Eosinophils Relative: 1 %
HCT: 38.8 % — ABNORMAL LOW (ref 39.0–52.0)
Hemoglobin: 12.2 g/dL — ABNORMAL LOW (ref 13.0–17.0)
Immature Granulocytes: 1 %
Lymphocytes Relative: 53 %
Lymphs Abs: 2.4 10*3/uL (ref 0.7–4.0)
MCH: 26.2 pg (ref 26.0–34.0)
MCHC: 31.4 g/dL (ref 30.0–36.0)
MCV: 83.3 fL (ref 80.0–100.0)
Monocytes Absolute: 0.3 10*3/uL (ref 0.1–1.0)
Monocytes Relative: 8 %
Neutro Abs: 1.6 10*3/uL — ABNORMAL LOW (ref 1.7–7.7)
Neutrophils Relative %: 36 %
Platelet Count: 239 10*3/uL (ref 150–400)
RBC: 4.66 MIL/uL (ref 4.22–5.81)
RDW: 23.8 % — ABNORMAL HIGH (ref 11.5–15.5)
WBC Count: 4.5 10*3/uL (ref 4.0–10.5)
nRBC: 0 % (ref 0.0–0.2)

## 2021-10-23 LAB — CEA (IN HOUSE-CHCC): CEA (CHCC-In House): 1.59 ng/mL (ref 0.00–5.00)

## 2021-10-23 MED ORDER — ONDANSETRON HCL 8 MG PO TABS: 8.0000 mg | ORAL_TABLET | Freq: Two times a day (BID) | ORAL | 1 refills | Status: DC | PRN

## 2021-10-23 MED ORDER — DEXAMETHASONE 4 MG PO TABS: 4.0000 mg | ORAL_TABLET | Freq: Every day | ORAL | 0 refills | Status: DC

## 2021-10-23 MED ORDER — PROMETHAZINE HCL 25 MG PO TABS: 25.0000 mg | ORAL_TABLET | Freq: Four times a day (QID) | ORAL | 0 refills | Status: DC | PRN

## 2021-10-23 MED ORDER — PROCHLORPERAZINE MALEATE 10 MG PO TABS: 10.0000 mg | ORAL_TABLET | Freq: Four times a day (QID) | ORAL | 1 refills | Status: DC | PRN

## 2021-10-23 NOTE — Progress Notes (Signed)
[place]   Telephone:([phone]) [phone] Fax:(336) [phone]   Clinic Follow up Note   Patient Care Team: [facility] as PCP - General (Family Medicine) Ralene Ok, MD as Consulting Physician (General Surgery) Alla Feeling, NP as Nurse Practitioner (Nurse Practitioner) Truitt Merle, MD as Consulting Physician (Hematology) Mohammed Kindle, MD as Referring Physician (Pain Medicine)  Date of Service:  10/23/2021  CHIEF COMPLAINT: f/u of colon cancer  CURRENT THERAPY:  Adjuvant CAPOX q3 weeks x6 months, starting 09/11/21  ASSESSMENT & PLAN:  Joseph Haney is a 46 y.o. male with   1. Moderately differentiated adenocarcinoma of ascending colon, pT4apN2bM0, stage IIIC, MMR normal, MSI-stable -he presented with N/V and abdominal pain, and IDA. Work up showed adenocarcinoma in the ascending colon. Staging work up was negative for metastatic disease. Baseline CEA is normal.  -s/p partial colectomy by Dr. Rosendo Gros 07/23/21, final surgical path showed pT4a lesion with LVI and PNI, no perforation, and clear margins. There was metastatic disease in 9/19 LNs, pN2b -Due to the high risk for recurrence in stage IIIC colon, we recommend adjuvant chemotherapy. The goal of adjuvant chemotherapy is curative. -He began Capox 09/11/2021. Following cycle 2, he experienced prolonged and moderate/severe nausea with vomiting and headaches after cycle 2, improved in last week,but still fatigued  -we will give him an extra week off to recover and we will reduce his dose of Oxaliplatin next cycle and add iv Emend   2. Chemo Side Effects: Nausea, Vomiting, Headaches -he experienced increased nausea with vomiting and headaches following cycle 2.  -I will refill his nausea medications and call in phenergan and dexa. -he does not feel he needs IVF today. Will delay treatment by a week.   2. Iron deficiency anemia, secondary to #1 -he presented to ED with acute on chronic  anemia (hgb range 9-12 since 2017), with hgb 7 and ferritin 2 -he was started on oral iron, tolerating. He did not receive IV iron in the hospital -He received Monoferric 09/11/2021, will schedule as needed in the future -Hgb improving   3. Seizure disorder and h/o alcohol abuse -on Keppra, no recent seizure in 2022.  -he quit drinking at age 72. I encouraged him to continue alcohol cessation   4. Social -patient is not working, and does not drive due to seizure disorder.  -he has medicaid  -he has 3 children ages 47, 28, 65 -has strong family support with wife and mother    18. Genetics -patient is young with early onset ascending colon cancer. He has 6 brothers. We recommend they all undergo colonoscopies now if they have not done so. -he understands his children will need to begin screening at or before age 81, or sooner if he has genetic mutation -patient's disease is MMR normal, MSI-stable. Not likely lynch syndrome. However, due to his young age, and a cousin with prostate cancer in his 36's, we strongly encouraged him to undergo genetic testing, he agreed   Goals of care -the goal of adjuvant chemotherapy is curative.  -full code    PLAN: -hold treatment today, postpone for a week, to allow him recover better  -She will return next week for cycle Arnold ox, plan to reduce oxaliplatin dose to 110 mg/m, and add IV Emend, continue Xeloda at the same dose -phone visit in 2 weeks -lab, f/u, and oxali in 4 weeks   No problem-specific Assessment & Plan notes found for this encounter.   SUMMARY OF ONCOLOGIC HISTORY:  Oncology History  Cancer of right colon ([name])  07/19/2021 Imaging   CT AP IMPRESSION: Focal area of high-grade narrowing of the proximal ascending colon may be sequela of chronic inflammation and stricture versus a mass. There is mild distension of the cecum and terminal ileum. Clinical correlation and further evaluation with lower GI study or colonoscopy  recommended.   07/22/2021 Procedure   Colonoscopy A severe stenosis was found in the distal ascending colon and was non-traversed. It was ulcerated and inflamed. This could be inflammatory versus malignant stricture.   07/22/2021 Initial Biopsy   FINAL MICROSCOPIC DIAGNOSIS:  A. COLON, ASCENDING, BIOPSY:  - Invasive colorectal adenocarcinoma, moderately differentiated.    07/23/2021 Definitive Surgery   PRE-OPERATIVE DIAGNOSIS:  right colon mass POST-OPERATIVE DIAGNOSIS:  right colon mass PROCEDURE:  Procedure(s): XI ROBOT ASSISTED LAPAROSCOPIC RIGHT COLECTOMY (N/A) Dr. Rosendo Gros   07/23/2021 Pathology Results   FINAL MICROSCOPIC DIAGNOSIS:  A. COLON, RIGHT, RESECTION:  - Invasive moderately differentiated colonic adenocarcinoma, 4.5 cm.  - Carcinoma extends into pericolonic connective tissue and focally  involves serosal surface.  - Margins not involved.  - Metastatic carcinoma in nine of nineteen lymph nodes (9/19).  - Unremarkable appendix. MMR normal, MSI-stable    07/23/2021 Cancer Staging   Staging form: Colon and Rectum, AJCC 8th Edition - Pathologic stage from 07/23/2021: Stage IIIC (pT4a, pN2b, cM0) - Signed by Alla Feeling, NP on 08/15/2021 Stage prefix: Initial diagnosis Histologic grading system: 4 grade system Histologic grade (G): G2 Laterality: Right Lymph-vascular invasion (LVI): LVI present/identified, NOS Perineural invasion (PNI): Present Microsatellite instability (MSI): Stable    07/24/2021 Initial Diagnosis   Colon adenocarcinoma ([place])   07/25/2021 Tumor Marker   CEA: 2.9   07/28/2021 Imaging   CT chest IMPRESSION: 1. No evidence of metastatic disease within the chest. 2. Lungs demonstrate atelectasis, most evident lower lobes and bases of the left upper lobe lingula and right middle lobe. No convincing pneumonia and no evidence of pulmonary edema.     09/11/2021 -  Chemotherapy   Patient is on Treatment Plan : COLORECTAL Xelox (Capeox)  q21d        INTERVAL HISTORY:  Joseph Haney is here for a follow up of colon cancer. He was last seen by NP Lacie on 10/02/21. He was seen in the infusion room as he arrived an hour late. He reports he experienced a lot of nausea with vomiting and headaches following his last chemo. He denies bowel disturbances.   All other systems were reviewed with the patient and are negative.  MEDICAL HISTORY:  Past Medical History:  Diagnosis Date   Alcohol abuse    GERD (gastroesophageal reflux disease)    Seizure ([place])     SURGICAL HISTORY: Past Surgical History:  Procedure Laterality Date   BIOPSY  07/22/2021   Procedure: BIOPSY;  Surgeon: [name], MD;  Location: [facility];  Service: Gastroenterology;;   COLONOSCOPY WITH PROPOFOL N/A 07/22/2021   Procedure: COLONOSCOPY WITH PROPOFOL;  Surgeon: [name], MD;  Location: [facility];  Service: Gastroenterology;  Laterality: N/A;   NO PAST SURGERIES     SUBMUCOSAL TATTOO INJECTION  07/22/2021   Procedure: SUBMUCOSAL TATTOO INJECTION;  Surgeon: [name], MD;  Location: [facility];  Service: Gastroenterology;;    I have reviewed the social history and family history with the patient and they are unchanged from previous note.  ALLERGIES:  is allergic to morphine and related.  MEDICATIONS:  Current Outpatient Medications  Medication Sig Dispense Refill  bacitracin ointment Apply 1 application topically 2 (two) times daily. 14 g 0   capecitabine (XELODA) 500 MG tablet Take 3 tablets (1,500 mg total) by mouth 2 (two) times daily after a meal. Take for 14 days on, 7 days off. Repeat every 21 days. 84 tablet 1   carvedilol (COREG) 25 MG tablet Take 1 tablet (25 mg total) by mouth 2 (two) times daily with a meal. 60 tablet 2   cyclobenzaprine (FLEXERIL) 10 MG tablet Take 10-20 mg by mouth 2 (two) times daily as needed for muscle spasms.     dexamethasone (DECADRON) 4 MG tablet Take 1 tablet (4 mg total) by  mouth daily. Take for 3-5 days after chemo for nausea and fatigue 20 tablet 0   docusate sodium (COLACE) 100 MG capsule Take 100 mg by mouth daily. (Patient not taking: Reported on 07/20/2021)     ferrous sulfate 325 (65 FE) MG tablet Take 1 tablet (325 mg total) by mouth 2 (two) times daily with a meal. 100 tablet 3   gabapentin (NEURONTIN) 300 MG capsule Take 300 mg by mouth See admin instructions. Take 300 mg by mouth three to four times a day     methocarbamol (ROBAXIN) 500 MG tablet Take 1 tablet (500 mg total) by mouth 2 (two) times daily. 20 tablet 0   NUCYNTA 75 MG tablet Take 75 mg by mouth See admin instructions. Take 75 mg by mouth four to five times a day as needed for pain     ondansetron (ZOFRAN) 8 MG tablet Take 1 tablet (8 mg total) by mouth 2 (two) times daily as needed for refractory nausea / vomiting. Start on day 3 after chemotherapy. 30 tablet 1   oxyCODONE (ROXICODONE) 15 MG immediate release tablet Take 15 mg by mouth every 4 (four) hours as needed for pain.     polyethylene glycol (MIRALAX / GLYCOLAX) 17 g packet Take 17 g by mouth daily as needed for mild constipation or moderate constipation. 14 each 0   prochlorperazine (COMPAZINE) 10 MG tablet Take 1 tablet (10 mg total) by mouth every 6 (six) hours as needed (Nausea or vomiting). 30 tablet 1   promethazine (PHENERGAN) 25 MG tablet Take 1 tablet (25 mg total) by mouth every 6 (six) hours as needed for nausea or vomiting. 30 tablet 0   No current facility-administered medications for this visit.    PHYSICAL EXAMINATION: ECOG PERFORMANCE STATUS: 1 - Symptomatic but completely ambulatory  There were no vitals filed for this visit. Wt Readings from Last 3 Encounters:  10/23/21 171 lb 8 oz (77.8 kg)  10/02/21 170 lb 3 oz (77.2 kg)  09/20/21 168 lb 8 oz (76.4 kg)     GENERAL:alert, no distress and comfortable SKIN: skin color normal, no rashes or significant lesions EYES: normal, Conjunctiva are pink and non-injected,  sclera clear  NEURO: alert & oriented x 3 with fluent speech  LABORATORY DATA:  I have reviewed the data as listed CBC Latest Ref Rng & Units 10/23/2021 10/02/2021 09/20/2021  WBC 4.0 - 10.5 K/uL 4.5 5.2 6.5  Hemoglobin 13.0 - 17.0 g/dL 12.2(L) 11.7(L) 11.5(L)  Hematocrit 39.0 - 52.0 % 38.8(L) 37.9(L) 37.7(L)  Platelets 150 - 400 K/uL 239 323 215     CMP Latest Ref Rng & Units 10/23/2021 10/02/2021 09/20/2021  Glucose 70 - 99 mg/dL 116(H) 105(H) 121(H)  BUN 6 - 20 mg/dL 8 8 12   Creatinine 0.61 - 1.24 mg/dL 0.88 0.87 0.99  Sodium 135 - 145  mmol/L 140 139 139  Potassium 3.5 - 5.1 mmol/L 4.0 3.8 3.8  Chloride 98 - 111 mmol/L 106 104 104  CO2 22 - 32 mmol/L 25 28 27   Calcium 8.9 - 10.3 mg/dL 9.4 9.2 9.5  Total Protein 6.5 - 8.1 g/dL 8.2(H) 8.5(H) 8.8(H)  Total Bilirubin 0.3 - 1.2 mg/dL 0.2(L) 0.3 0.4  Alkaline Phos 38 - 126 U/L 84 93 84  AST 15 - 41 U/L 27 19 28   ALT 0 - 44 U/L 28 21 22       RADIOGRAPHIC STUDIES: I have personally reviewed the radiological images as listed and agreed with the findings in the report. No results found.    No orders of the defined types were placed in this encounter.  All questions were answered. The patient knows to call the clinic with any problems, questions or concerns. No barriers to learning was detected. The total time spent in the appointment was 30 minutes.     Truitt Merle, MD 10/23/2021   I, Wilburn Mylar, am acting as scribe for Truitt Merle, MD.   I have reviewed the above documentation for accuracy and completeness, and I agree with the above.

## 2021-10-28 ENCOUNTER — Telehealth: Payer: Self-pay | Admitting: Hematology

## 2021-10-28 NOTE — Telephone Encounter (Signed)
Scheduled follow-up appointments per 2/22 los. Patient is aware. 

## 2021-10-29 ENCOUNTER — Other Ambulatory Visit (HOSPITAL_COMMUNITY): Payer: Self-pay

## 2021-10-30 ENCOUNTER — Ambulatory Visit: Payer: Medicaid Other

## 2021-10-30 ENCOUNTER — Other Ambulatory Visit: Payer: Medicaid Other

## 2021-10-30 DIAGNOSIS — Z0000 Encounter for general adult medical examination without abnormal findings: Secondary | ICD-10-CM | POA: Diagnosis not present

## 2021-10-30 DIAGNOSIS — Z1159 Encounter for screening for other viral diseases: Secondary | ICD-10-CM | POA: Diagnosis not present

## 2021-10-30 DIAGNOSIS — Z0130 Encounter for examination of blood pressure without abnormal findings: Secondary | ICD-10-CM | POA: Diagnosis not present

## 2021-10-30 DIAGNOSIS — M549 Dorsalgia, unspecified: Secondary | ICD-10-CM | POA: Diagnosis not present

## 2021-10-30 DIAGNOSIS — G8929 Other chronic pain: Secondary | ICD-10-CM | POA: Diagnosis not present

## 2021-10-30 DIAGNOSIS — R5383 Other fatigue: Secondary | ICD-10-CM | POA: Diagnosis not present

## 2021-10-30 DIAGNOSIS — E1165 Type 2 diabetes mellitus with hyperglycemia: Secondary | ICD-10-CM | POA: Diagnosis not present

## 2021-10-30 DIAGNOSIS — Z79899 Other long term (current) drug therapy: Secondary | ICD-10-CM | POA: Diagnosis not present

## 2021-10-31 ENCOUNTER — Inpatient Hospital Stay: Payer: Medicaid Other | Attending: Nurse Practitioner

## 2021-10-31 ENCOUNTER — Other Ambulatory Visit (HOSPITAL_COMMUNITY): Payer: Self-pay

## 2021-10-31 ENCOUNTER — Other Ambulatory Visit: Payer: Self-pay

## 2021-10-31 ENCOUNTER — Inpatient Hospital Stay: Payer: Medicaid Other

## 2021-10-31 VITALS — BP 132/94 | HR 93 | Temp 98.3°F | Resp 18 | Wt 172.6 lb

## 2021-10-31 DIAGNOSIS — D509 Iron deficiency anemia, unspecified: Secondary | ICD-10-CM | POA: Insufficient documentation

## 2021-10-31 DIAGNOSIS — G40909 Epilepsy, unspecified, not intractable, without status epilepticus: Secondary | ICD-10-CM | POA: Insufficient documentation

## 2021-10-31 DIAGNOSIS — C182 Malignant neoplasm of ascending colon: Secondary | ICD-10-CM | POA: Diagnosis not present

## 2021-10-31 DIAGNOSIS — R112 Nausea with vomiting, unspecified: Secondary | ICD-10-CM | POA: Diagnosis not present

## 2021-10-31 DIAGNOSIS — Z79899 Other long term (current) drug therapy: Secondary | ICD-10-CM | POA: Diagnosis not present

## 2021-10-31 DIAGNOSIS — Z5111 Encounter for antineoplastic chemotherapy: Secondary | ICD-10-CM | POA: Diagnosis not present

## 2021-10-31 DIAGNOSIS — R51 Headache with orthostatic component, not elsewhere classified: Secondary | ICD-10-CM | POA: Insufficient documentation

## 2021-10-31 DIAGNOSIS — F1010 Alcohol abuse, uncomplicated: Secondary | ICD-10-CM | POA: Diagnosis not present

## 2021-10-31 DIAGNOSIS — C189 Malignant neoplasm of colon, unspecified: Secondary | ICD-10-CM

## 2021-10-31 DIAGNOSIS — K219 Gastro-esophageal reflux disease without esophagitis: Secondary | ICD-10-CM | POA: Diagnosis not present

## 2021-10-31 LAB — CBC WITH DIFFERENTIAL (CANCER CENTER ONLY)
Abs Immature Granulocytes: 0.03 10*3/uL (ref 0.00–0.07)
Basophils Absolute: 0 10*3/uL (ref 0.0–0.1)
Basophils Relative: 0 %
Eosinophils Absolute: 0.1 10*3/uL (ref 0.0–0.5)
Eosinophils Relative: 1 %
HCT: 39.2 % (ref 39.0–52.0)
Hemoglobin: 12.4 g/dL — ABNORMAL LOW (ref 13.0–17.0)
Immature Granulocytes: 1 %
Lymphocytes Relative: 48 %
Lymphs Abs: 2.7 10*3/uL (ref 0.7–4.0)
MCH: 26.7 pg (ref 26.0–34.0)
MCHC: 31.6 g/dL (ref 30.0–36.0)
MCV: 84.5 fL (ref 80.0–100.0)
Monocytes Absolute: 0.4 10*3/uL (ref 0.1–1.0)
Monocytes Relative: 8 %
Neutro Abs: 2.3 10*3/uL (ref 1.7–7.7)
Neutrophils Relative %: 42 %
Platelet Count: 282 10*3/uL (ref 150–400)
RBC: 4.64 MIL/uL (ref 4.22–5.81)
RDW: 21.8 % — ABNORMAL HIGH (ref 11.5–15.5)
WBC Count: 5.5 10*3/uL (ref 4.0–10.5)
nRBC: 0 % (ref 0.0–0.2)

## 2021-10-31 LAB — CMP (CANCER CENTER ONLY)
ALT: 18 U/L (ref 0–44)
AST: 20 U/L (ref 15–41)
Albumin: 4.2 g/dL (ref 3.5–5.0)
Alkaline Phosphatase: 87 U/L (ref 38–126)
Anion gap: 7 (ref 5–15)
BUN: 14 mg/dL (ref 6–20)
CO2: 27 mmol/L (ref 22–32)
Calcium: 9.6 mg/dL (ref 8.9–10.3)
Chloride: 103 mmol/L (ref 98–111)
Creatinine: 1.04 mg/dL (ref 0.61–1.24)
GFR, Estimated: >60 mL/min (ref >60)
Glucose, Bld: 113 mg/dL — ABNORMAL HIGH (ref 70–99)
Potassium: 4.1 mmol/L (ref 3.5–5.1)
Sodium: 137 mmol/L (ref 135–145)
Total Bilirubin: 0.3 mg/dL (ref 0.3–1.2)
Total Protein: 8.4 g/dL — ABNORMAL HIGH (ref 6.5–8.1)

## 2021-10-31 MED ORDER — OXALIPLATIN CHEMO INJECTION 100 MG/20ML
110.0000 mg/m2 | Freq: Once | INTRAVENOUS | Status: AC
  Administered 2021-10-31: 210 mg via INTRAVENOUS
  Filled 2021-10-31: qty 40

## 2021-10-31 MED ORDER — PALONOSETRON HCL INJECTION 0.25 MG/5ML
0.2500 mg | Freq: Once | INTRAVENOUS | Status: AC
  Administered 2021-10-31: 0.25 mg via INTRAVENOUS
  Filled 2021-10-31: qty 5

## 2021-10-31 MED ORDER — SODIUM CHLORIDE 0.9 % IV SOLN
150.0000 mg | Freq: Once | INTRAVENOUS | Status: AC
  Administered 2021-10-31: 150 mg via INTRAVENOUS
  Filled 2021-10-31: qty 150

## 2021-10-31 MED ORDER — SODIUM CHLORIDE 0.9 % IV SOLN
10.0000 mg | Freq: Once | INTRAVENOUS | Status: AC
  Administered 2021-10-31: 10 mg via INTRAVENOUS
  Filled 2021-10-31: qty 10

## 2021-10-31 MED ORDER — DEXTROSE 5 % IV SOLN: Freq: Once | INTRAVENOUS | Status: AC

## 2021-10-31 NOTE — Patient Instructions (Signed)
[facility] MEDICAL [facility]COL[facility]   ?Discharge Instructions: ?Thank you for choosing [facility] to provide your oncology and hematology care.  ? ?If you have a lab appointment with the Cancer Center, please go directly to the Cancer Center and check in at the registration area. ?  ?Wear comfortable clothing and clothing appropriate for easy access to any Portacath or PICC line.  ? ?We strive to give you quality time with your provider. You may need to reschedule your appointment if you arrive late (15 or more minutes).  Arriving late affects you and other patients whose appointments are after yours.  Also, if you miss three or more appointments without notifying the office, you may be dismissed from the clinic at the provider?s discretion.    ?  ?For prescription refill requests, have your pharmacy contact our office and allow 72 hours for refills to be completed.   ? ?Today you received the following chemotherapy and/or immunotherapy agents: oxaliplatin    ?  ?To help prevent nausea and vomiting after your treatment, we encourage you to take your nausea medication as directed. ? ?BELOW ARE SYMPTOMS THAT SHOULD BE REPORTED IMMEDIATELY: ?*FEVER GREATER THAN 100.4 F (38 ?C) OR HIGHER ?*CHILLS OR SWEATING ?*NAUSEA AND VOMITING THAT IS NOT CONTROLLED WITH YOUR NAUSEA MEDICATION ?*UNUSUAL SHORTNESS OF BREATH ?*UNUSUAL BRUISING OR BLEEDING ?*URINARY PROBLEMS (pain or burning when urinating, or frequent urination) ?*BOWEL PROBLEMS (unusual diarrhea, constipation, pain near the anus) ?TENDERNESS IN MOUTH AND THROAT WITH OR WITHOUT PRESENCE OF ULCERS (sore throat, sores in mouth, or a toothache) ?UNUSUAL RASH, SWELLING OR PAIN  ?UNUSUAL VAGINAL DISCHARGE OR ITCHING  ? ?Items with * indicate a potential emergency and should be followed up as soon as possible or go to the Emergency Department if any problems should occur. ? ?Please show the CHEMOTHERAPY ALERT CARD or IMMUNOTHERAPY ALERT CARD at check-in  to the Emergency Department and triage nurse. ? ?Should you have questions after your visit or need to cancel or reschedule your appointment, please contact [facility]  Dept: [phone]  and follow the prompts.  Office hours are 8:00 a.m. to 4:30 p.m. Monday - Friday. Please note that voicemails left after 4:00 p.m. may not be returned until the following business day.  We are closed weekends and major holidays. You have access to a nurse at all times for urgent questions. Please call the main number to the clinic Dept: [phone] and follow the prompts. ? ? ?For any non-urgent questions, you may also contact your provider using MyChart. We now offer e-Visits for anyone 18 and older to request care online for non-urgent symptoms. For details visit [email or web address]. ?  ?Also download the MyChart app! Go to the app store, search "MyChart", open the app, select [facility], and log in with your MyChart username and password. ? ?Due to Covid, a mask is required upon entering the hospital/clinic. If you do not have a mask, one will be given to you upon arrival. For doctor visits, patients may have 1 support Lecia Esperanza aged 18 or older with them. For treatment visits, patients cannot have anyone with them due to current Covid guidelines and our immunocompromised population.  ? ?

## 2021-11-01 ENCOUNTER — Telehealth: Payer: Self-pay

## 2021-11-01 DIAGNOSIS — Z79899 Other long term (current) drug therapy: Secondary | ICD-10-CM | POA: Diagnosis not present

## 2021-11-01 NOTE — Telephone Encounter (Signed)
Pt's spouse called stating pt his vomiting x1 day (4x today), having headaches, and sweating & having chills. Pt denied fevers.  Recommended pt due a home Covid Test to r/o Covid.  Asked pt and spouse about how he's taking antinausea medications.  Pt stated he's only taking the promethazine.  Educated pt on how to take his antinausea medications.  Informed pt that he is supposed to be taking Dexamethasone 2xdaily for 3 to 5 days, Zofran 8 mg on Day 3 post , and Compazine/Phenergan Q6hrs PRN for nausea.  Pt and spouse verbalized understanding and verbalized how and when to take medication.  Instructed pt to try to increase fluid intake to keep from getting dehydrated.  Also instructed pt to incorporate Colace and Senokot tabs into help with constipation.  Pt stated he's taking Tylenol or sometimes Ibuprofen for his headache but it's not stopping the headache.  Encouraged pt to continue to take Tylenol or Ibuprofen for his headaches.  Instructed pt if his symptoms does not get better (nausea and headache) by Sunday morning, to stop taking the Capecitabine and give Dr. Ernestina Penna office a call Monday morning.  If pt's symptoms have not resolved by Monday, will probably have pt come in for hydration and IV nausea medication.  Notified Dr. Burr Medico of pt's symptoms. ?

## 2021-11-05 ENCOUNTER — Encounter: Payer: Self-pay | Admitting: Hematology

## 2021-11-05 ENCOUNTER — Other Ambulatory Visit: Payer: Self-pay

## 2021-11-05 ENCOUNTER — Inpatient Hospital Stay (HOSPITAL_BASED_OUTPATIENT_CLINIC_OR_DEPARTMENT_OTHER): Payer: Medicaid Other | Admitting: Hematology

## 2021-11-05 DIAGNOSIS — C182 Malignant neoplasm of ascending colon: Secondary | ICD-10-CM

## 2021-11-05 MED ORDER — ONDANSETRON 8 MG PO TBDP: 8.0000 mg | ORAL_TABLET | Freq: Three times a day (TID) | ORAL | 0 refills | Status: DC | PRN

## 2021-11-05 NOTE — Progress Notes (Signed)
[place]   Telephone:(336) [phone] Fax:(336) [phone]   Clinic Follow up Note   Patient Care Team: [facility] as PCP - General (Family Medicine) Ralene Ok, MD as Consulting Physician (General Surgery) Alla Feeling, NP as Nurse Practitioner (Nurse Practitioner) Truitt Merle, MD as Consulting Physician (Hematology) Mohammed Kindle, MD as Referring Physician (Pain Medicine)  Date of Service:  11/05/2021  CHIEF COMPLAINT: f/u of colon cancer  CURRENT THERAPY:  Adjuvant CAPOX q3 weeks x6 months, starting 09/11/21  ASSESSMENT & PLAN:  Joseph Haney is a 46 y.o. male with   1. Moderately differentiated adenocarcinoma of ascending colon, pT4apN2bM0, stage IIIC, MMR normal, MSI-stable -he presented with N/V and abdominal pain, and IDA. Work up showed adenocarcinoma in the ascending colon. Staging work up was negative for metastatic disease. Baseline CEA is normal.  -s/p partial colectomy by Dr. Rosendo Gros 07/23/21, final surgical path showed pT4a lesion with LVI and PNI, no perforation, and clear margins. There was metastatic disease in 9/19 LNs, pN2b -Due to the high risk for recurrence in stage IIIC colon, we recommend adjuvant chemotherapy. The goal of adjuvant chemotherapy is curative. -He began Capox 09/11/2021. Following cycle 2, he experienced prolonged and moderate/severe nausea with vomiting and headaches after cycle 2, and cycle 3 chemo was postponed for a week -I reduced oxaliplatin chemo dose from cycle 3, and add iv Emend, he still had a significant nausea and vomiting, but recovered faster than cycle 2 -I called in dissolvable potential for him today   2. Chemo Side Effects: Nausea, Vomiting, Headaches -he experienced increased nausea with vomiting and headaches following cycle 2.  -He still experienced significant nausea and vomiting after cycle 3, but is recovering faster than last cycle -I called in Zofran ODT today, will  continue Compazine and Phenergan as needed   2. Iron deficiency anemia, secondary to #1 -he presented to ED with acute on chronic anemia (hgb range 9-12 since 2017), with hgb 7 and ferritin 2 -he was started on oral iron, tolerating. He did not receive IV iron in the hospital -He received Monoferric 09/11/2021, will schedule as needed in the future    3. Seizure disorder and h/o alcohol abuse -on Keppra, no recent seizure in 2022.  -he quit drinking at age 29. I encouraged him to continue alcohol cessation   4. Social -patient is not working, and does not drive due to seizure disorder.  -he has medicaid  -he has 3 children ages 91, 70, 57 -has strong family support with wife and mother    54. Genetics -patient is young with early onset ascending colon cancer. He has 6 brothers. We recommend they all undergo colonoscopies now if they have not done so. -he understands his children will need to begin screening at or before age 9, or sooner if he has genetic mutation -patient's disease is MMR normal, MSI-stable. Not likely lynch syndrome. However, due to his young age, and a cousin with prostate cancer in his 56's, we strongly encouraged him to undergo genetic testing, he agreed   Goals of care -the goal of adjuvant chemotherapy is curative.  -full code    PLAN: -I called in Zofran ODT today  -genetic referral  -he will return on 3/23 for cycle 4, will see how he recovers and if he needs more dose reduction    No problem-specific Assessment & Plan notes found for this encounter.   SUMMARY OF ONCOLOGIC HISTORY: Oncology History  Cancer of right colon (  [place])  07/19/2021 Imaging   CT AP IMPRESSION: Focal area of high-grade narrowing of the proximal ascending colon may be sequela of chronic inflammation and stricture versus a mass. There is mild distension of the cecum and terminal ileum. Clinical correlation and further evaluation with lower GI study or colonoscopy recommended.    07/22/2021 Procedure   Colonoscopy A severe stenosis was found in the distal ascending colon and was non-traversed. It was ulcerated and inflamed. This could be inflammatory versus malignant stricture.   07/22/2021 Initial Biopsy   FINAL MICROSCOPIC DIAGNOSIS:  A. COLON, ASCENDING, BIOPSY:  - Invasive colorectal adenocarcinoma, moderately differentiated.    07/23/2021 Definitive Surgery   PRE-OPERATIVE DIAGNOSIS:  right colon mass POST-OPERATIVE DIAGNOSIS:  right colon mass PROCEDURE:  Procedure(s): XI ROBOT ASSISTED LAPAROSCOPIC RIGHT COLECTOMY (N/A) Dr. Rosendo Gros   07/23/2021 Pathology Results   FINAL MICROSCOPIC DIAGNOSIS:  A. COLON, RIGHT, RESECTION:  - Invasive moderately differentiated colonic adenocarcinoma, 4.5 cm.  - Carcinoma extends into pericolonic connective tissue and focally  involves serosal surface.  - Margins not involved.  - Metastatic carcinoma in nine of nineteen lymph nodes (9/19).  - Unremarkable appendix. MMR normal, MSI-stable    07/23/2021 Cancer Staging   Staging form: Colon and Rectum, AJCC 8th Edition - Pathologic stage from 07/23/2021: Stage IIIC (pT4a, pN2b, cM0) - Signed by Alla Feeling, NP on 08/15/2021 Stage prefix: Initial diagnosis Histologic grading system: 4 grade system Histologic grade (G): G2 Laterality: Right Lymph-vascular invasion (LVI): LVI present/identified, NOS Perineural invasion (PNI): Present Microsatellite instability (MSI): Stable    07/24/2021 Initial Diagnosis   Colon adenocarcinoma ([place])   07/25/2021 Tumor Marker   CEA: 2.9   07/28/2021 Imaging   CT chest IMPRESSION: 1. No evidence of metastatic disease within the chest. 2. Lungs demonstrate atelectasis, most evident lower lobes and bases of the left upper lobe lingula and right middle lobe. No convincing pneumonia and no evidence of pulmonary edema.     09/11/2021 -  Chemotherapy   Patient is on Treatment Plan : COLORECTAL Xelox (Capeox) q21d         INTERVAL HISTORY:  Joseph Haney is here for a follow up of colon cancer. He had cycle 3 chemo last week, he had severe nausea and vomiting in past 3 days, better today.  He was able to drink fluids adequately, no significant weight loss per patient.  No fever, or other complaints.  Bowel movement has been normal.  All other systems were reviewed with the patient and are negative.  MEDICAL HISTORY:  Past Medical History:  Diagnosis Date   Alcohol abuse    GERD (gastroesophageal reflux disease)    Seizure ([place])     SURGICAL HISTORY: Past Surgical History:  Procedure Laterality Date   BIOPSY  07/22/2021   Procedure: BIOPSY;  Surgeon: [name], MD;  Location: [facility];  Service: Gastroenterology;;   COLONOSCOPY WITH PROPOFOL N/A 07/22/2021   Procedure: COLONOSCOPY WITH PROPOFOL;  Surgeon: [name], MD;  Location: [facility];  Service: Gastroenterology;  Laterality: N/A;   NO PAST SURGERIES     SUBMUCOSAL TATTOO INJECTION  07/22/2021   Procedure: SUBMUCOSAL TATTOO INJECTION;  Surgeon: [name], MD;  Location: [facility];  Service: Gastroenterology;;    I have reviewed the social history and family history with the patient and they are unchanged from previous note.  ALLERGIES:  is allergic to morphine and related.  MEDICATIONS:  Current Outpatient Medications  Medication Sig Dispense Refill   ondansetron (ZOFRAN-ODT) 8 MG disintegrating  tablet Take 1 tablet (8 mg total) by mouth every 8 (eight) hours as needed for nausea or vomiting. 20 tablet 0   bacitracin ointment Apply 1 application topically 2 (two) times daily. 14 g 0   capecitabine (XELODA) 500 MG tablet Take 3 tablets (1,500 mg total) by mouth 2 (two) times daily after a meal. Take for 14 days on, 7 days off. Repeat every 21 days. 84 tablet 1   carvedilol (COREG) 25 MG tablet Take 1 tablet (25 mg total) by mouth 2 (two) times daily with a meal. 60 tablet 2   cyclobenzaprine  (FLEXERIL) 10 MG tablet Take 10-20 mg by mouth 2 (two) times daily as needed for muscle spasms.     dexamethasone (DECADRON) 4 MG tablet Take 1 tablet (4 mg total) by mouth daily. Take for 3-5 days after chemo for nausea and fatigue 20 tablet 0   docusate sodium (COLACE) 100 MG capsule Take 100 mg by mouth daily. (Patient not taking: Reported on 07/20/2021)     ferrous sulfate 325 (65 FE) MG tablet Take 1 tablet (325 mg total) by mouth 2 (two) times daily with a meal. 100 tablet 3   gabapentin (NEURONTIN) 300 MG capsule Take 300 mg by mouth See admin instructions. Take 300 mg by mouth three to four times a day     methocarbamol (ROBAXIN) 500 MG tablet Take 1 tablet (500 mg total) by mouth 2 (two) times daily. 20 tablet 0   NUCYNTA 75 MG tablet Take 75 mg by mouth See admin instructions. Take 75 mg by mouth four to five times a day as needed for pain     ondansetron (ZOFRAN) 8 MG tablet Take 1 tablet (8 mg total) by mouth 2 (two) times daily as needed for refractory nausea / vomiting. Start on day 3 after chemotherapy. 30 tablet 1   oxyCODONE (ROXICODONE) 15 MG immediate release tablet Take 15 mg by mouth every 4 (four) hours as needed for pain.     polyethylene glycol (MIRALAX / GLYCOLAX) 17 g packet Take 17 g by mouth daily as needed for mild constipation or moderate constipation. 14 each 0   prochlorperazine (COMPAZINE) 10 MG tablet Take 1 tablet (10 mg total) by mouth every 6 (six) hours as needed (Nausea or vomiting). 30 tablet 1   promethazine (PHENERGAN) 25 MG tablet Take 1 tablet (25 mg total) by mouth every 6 (six) hours as needed for nausea or vomiting. 30 tablet 0   No current facility-administered medications for this visit.    PHYSICAL EXAMINATION: ECOG PERFORMANCE STATUS: 1 - Symptomatic but completely ambulatory  There were no vitals filed for this visit. Wt Readings from Last 3 Encounters:  10/31/21 172 lb 9 oz (78.3 kg)  10/23/21 171 lb 8 oz (77.8 kg)  10/02/21 170 lb 3 oz  (77.2 kg)    NO exam today   LABORATORY DATA:  I have reviewed the data as listed CBC Latest Ref Rng & Units 10/31/2021 10/23/2021 10/02/2021  WBC 4.0 - 10.5 K/uL 5.5 4.5 5.2  Hemoglobin 13.0 - 17.0 g/dL 12.4(L) 12.2(L) 11.7(L)  Hematocrit 39.0 - 52.0 % 39.2 38.8(L) 37.9(L)  Platelets 150 - 400 K/uL 282 239 323     CMP Latest Ref Rng & Units 10/31/2021 10/23/2021 10/02/2021  Glucose 70 - 99 mg/dL 113(H) 116(H) 105(H)  BUN 6 - 20 mg/dL 14 8 8   Creatinine 0.61 - 1.24 mg/dL 1.04 0.88 0.87  Sodium 135 - 145 mmol/L 137 140 139  Potassium  3.5 - 5.1 mmol/L 4.1 4.0 3.8  Chloride 98 - 111 mmol/L 103 106 104  CO2 22 - 32 mmol/L 27 25 28   Calcium 8.9 - 10.3 mg/dL 9.6 9.4 9.2  Total Protein 6.5 - 8.1 g/dL 8.4(H) 8.2(H) 8.5(H)  Total Bilirubin 0.3 - 1.2 mg/dL 0.3 0.2(L) 0.3  Alkaline Phos 38 - 126 U/L 87 84 93  AST 15 - 41 U/L 20 27 19   ALT 0 - 44 U/L 18 28 21       RADIOGRAPHIC STUDIES: I have personally reviewed the radiological images as listed and agreed with the findings in the report. No results found.    No orders of the defined types were placed in this encounter.  All questions were answered. The patient knows to call the clinic with any problems, questions or concerns. No barriers to learning was detected. The total time spent in the appointment was 13 minutes.     Truitt Merle, MD 11/05/2021

## 2021-11-06 ENCOUNTER — Other Ambulatory Visit (HOSPITAL_COMMUNITY): Payer: Self-pay

## 2021-11-06 ENCOUNTER — Telehealth: Payer: Self-pay | Admitting: Hematology

## 2021-11-06 NOTE — Telephone Encounter (Signed)
Scheduled appointment per inbasket. Patient aware.  ?

## 2021-11-13 ENCOUNTER — Inpatient Hospital Stay: Payer: Medicaid Other | Admitting: Nurse Practitioner

## 2021-11-13 ENCOUNTER — Inpatient Hospital Stay: Payer: Medicaid Other

## 2021-11-20 ENCOUNTER — Inpatient Hospital Stay: Payer: Medicaid Other | Admitting: Nurse Practitioner

## 2021-11-20 ENCOUNTER — Inpatient Hospital Stay: Payer: Medicaid Other

## 2021-11-20 DIAGNOSIS — M549 Dorsalgia, unspecified: Secondary | ICD-10-CM | POA: Diagnosis not present

## 2021-11-20 DIAGNOSIS — Z79899 Other long term (current) drug therapy: Secondary | ICD-10-CM | POA: Diagnosis not present

## 2021-11-20 DIAGNOSIS — G8929 Other chronic pain: Secondary | ICD-10-CM | POA: Diagnosis not present

## 2021-11-20 DIAGNOSIS — C189 Malignant neoplasm of colon, unspecified: Secondary | ICD-10-CM | POA: Diagnosis not present

## 2021-11-21 ENCOUNTER — Inpatient Hospital Stay: Payer: Medicaid Other

## 2021-11-25 ENCOUNTER — Other Ambulatory Visit (HOSPITAL_COMMUNITY): Payer: Self-pay

## 2021-11-25 DIAGNOSIS — Z79899 Other long term (current) drug therapy: Secondary | ICD-10-CM | POA: Diagnosis not present

## 2021-11-27 ENCOUNTER — Other Ambulatory Visit: Payer: Self-pay | Admitting: Nurse Practitioner

## 2021-11-27 ENCOUNTER — Other Ambulatory Visit (HOSPITAL_COMMUNITY): Payer: Self-pay

## 2021-11-27 DIAGNOSIS — C189 Malignant neoplasm of colon, unspecified: Secondary | ICD-10-CM

## 2021-11-27 MED ORDER — CAPECITABINE 500 MG PO TABS
850.0000 mg/m2 | ORAL_TABLET | Freq: Two times a day (BID) | ORAL | 1 refills | Status: DC
  Filled 2021-11-28: qty 84, 14d supply, fill #0
  Filled 2021-12-17: qty 84, 14d supply, fill #1

## 2021-11-28 ENCOUNTER — Other Ambulatory Visit (HOSPITAL_COMMUNITY): Payer: Self-pay

## 2021-11-29 ENCOUNTER — Other Ambulatory Visit: Payer: Self-pay

## 2021-11-29 ENCOUNTER — Inpatient Hospital Stay: Payer: Medicaid Other

## 2021-11-29 ENCOUNTER — Inpatient Hospital Stay (HOSPITAL_BASED_OUTPATIENT_CLINIC_OR_DEPARTMENT_OTHER): Payer: Medicaid Other | Admitting: Hematology

## 2021-11-29 ENCOUNTER — Encounter: Payer: Self-pay | Admitting: Hematology

## 2021-11-29 VITALS — HR 110

## 2021-11-29 VITALS — BP 136/92 | HR 113 | Temp 98.7°F | Resp 18 | Ht 69.0 in | Wt 176.1 lb

## 2021-11-29 DIAGNOSIS — R112 Nausea with vomiting, unspecified: Secondary | ICD-10-CM | POA: Diagnosis not present

## 2021-11-29 DIAGNOSIS — C189 Malignant neoplasm of colon, unspecified: Secondary | ICD-10-CM

## 2021-11-29 DIAGNOSIS — K219 Gastro-esophageal reflux disease without esophagitis: Secondary | ICD-10-CM | POA: Diagnosis not present

## 2021-11-29 DIAGNOSIS — D509 Iron deficiency anemia, unspecified: Secondary | ICD-10-CM | POA: Diagnosis not present

## 2021-11-29 DIAGNOSIS — Z79899 Other long term (current) drug therapy: Secondary | ICD-10-CM | POA: Diagnosis not present

## 2021-11-29 DIAGNOSIS — C182 Malignant neoplasm of ascending colon: Secondary | ICD-10-CM

## 2021-11-29 DIAGNOSIS — G40909 Epilepsy, unspecified, not intractable, without status epilepticus: Secondary | ICD-10-CM | POA: Diagnosis not present

## 2021-11-29 DIAGNOSIS — Z5111 Encounter for antineoplastic chemotherapy: Secondary | ICD-10-CM | POA: Diagnosis not present

## 2021-11-29 DIAGNOSIS — R51 Headache with orthostatic component, not elsewhere classified: Secondary | ICD-10-CM | POA: Diagnosis not present

## 2021-11-29 DIAGNOSIS — F1010 Alcohol abuse, uncomplicated: Secondary | ICD-10-CM | POA: Diagnosis not present

## 2021-11-29 LAB — CBC WITH DIFFERENTIAL (CANCER CENTER ONLY)
Abs Immature Granulocytes: 0.05 10*3/uL (ref 0.00–0.07)
Basophils Absolute: 0 10*3/uL (ref 0.0–0.1)
Basophils Relative: 0 %
Eosinophils Absolute: 0.1 10*3/uL (ref 0.0–0.5)
Eosinophils Relative: 2 %
HCT: 39 % (ref 39.0–52.0)
Hemoglobin: 12.6 g/dL — ABNORMAL LOW (ref 13.0–17.0)
Immature Granulocytes: 1 %
Lymphocytes Relative: 50 %
Lymphs Abs: 2.8 10*3/uL (ref 0.7–4.0)
MCH: 28 pg (ref 26.0–34.0)
MCHC: 32.3 g/dL (ref 30.0–36.0)
MCV: 86.7 fL (ref 80.0–100.0)
Monocytes Absolute: 0.4 10*3/uL (ref 0.1–1.0)
Monocytes Relative: 7 %
Neutro Abs: 2.3 10*3/uL (ref 1.7–7.7)
Neutrophils Relative %: 40 %
Platelet Count: 201 10*3/uL (ref 150–400)
RBC: 4.5 MIL/uL (ref 4.22–5.81)
RDW: 18.6 % — ABNORMAL HIGH (ref 11.5–15.5)
WBC Count: 5.7 10*3/uL (ref 4.0–10.5)
nRBC: 0 % (ref 0.0–0.2)

## 2021-11-29 LAB — CMP (CANCER CENTER ONLY)
ALT: 19 U/L (ref 0–44)
AST: 22 U/L (ref 15–41)
Albumin: 3.9 g/dL (ref 3.5–5.0)
Alkaline Phosphatase: 80 U/L (ref 38–126)
Anion gap: 7 (ref 5–15)
BUN: 11 mg/dL (ref 6–20)
CO2: 29 mmol/L (ref 22–32)
Calcium: 9.4 mg/dL (ref 8.9–10.3)
Chloride: 104 mmol/L (ref 98–111)
Creatinine: 0.9 mg/dL (ref 0.61–1.24)
GFR, Estimated: >60 mL/min (ref >60)
Glucose, Bld: 104 mg/dL — ABNORMAL HIGH (ref 70–99)
Potassium: 4 mmol/L (ref 3.5–5.1)
Sodium: 140 mmol/L (ref 135–145)
Total Bilirubin: 0.3 mg/dL (ref 0.3–1.2)
Total Protein: 8.1 g/dL (ref 6.5–8.1)

## 2021-11-29 LAB — CEA (IN HOUSE-CHCC): CEA (CHCC-In House): 1.41 ng/mL (ref 0.00–5.00)

## 2021-11-29 MED ORDER — SODIUM CHLORIDE 0.9 % IV SOLN
10.0000 mg | Freq: Once | INTRAVENOUS | Status: AC
  Administered 2021-11-29: 10 mg via INTRAVENOUS
  Filled 2021-11-29: qty 10

## 2021-11-29 MED ORDER — OXALIPLATIN CHEMO INJECTION 100 MG/20ML
103.5000 mg/m2 | Freq: Once | INTRAVENOUS | Status: AC
  Administered 2021-11-29: 200 mg via INTRAVENOUS
  Filled 2021-11-29: qty 40

## 2021-11-29 MED ORDER — PROCHLORPERAZINE MALEATE 10 MG PO TABS: 10.0000 mg | ORAL_TABLET | Freq: Four times a day (QID) | ORAL | 1 refills | Status: DC | PRN

## 2021-11-29 MED ORDER — PALONOSETRON HCL INJECTION 0.25 MG/5ML
0.2500 mg | Freq: Once | INTRAVENOUS | Status: AC
  Administered 2021-11-29: 0.25 mg via INTRAVENOUS
  Filled 2021-11-29: qty 5

## 2021-11-29 MED ORDER — ONDANSETRON 8 MG PO TBDP: 8.0000 mg | ORAL_TABLET | Freq: Three times a day (TID) | ORAL | 0 refills | Status: DC | PRN

## 2021-11-29 MED ORDER — PROMETHAZINE HCL 25 MG PO TABS: 25.0000 mg | ORAL_TABLET | Freq: Four times a day (QID) | ORAL | 0 refills | Status: DC | PRN

## 2021-11-29 MED ORDER — SODIUM CHLORIDE 0.9 % IV SOLN
150.0000 mg | Freq: Once | INTRAVENOUS | Status: AC
  Administered 2021-11-29: 150 mg via INTRAVENOUS
  Filled 2021-11-29: qty 150

## 2021-11-29 MED ORDER — DEXTROSE 5 % IV SOLN: Freq: Once | INTRAVENOUS | Status: AC

## 2021-11-29 MED ORDER — DEXAMETHASONE 4 MG PO TABS: 4.0000 mg | ORAL_TABLET | Freq: Every day | ORAL | 0 refills | Status: DC

## 2021-11-29 NOTE — Patient Instructions (Signed)
[facility]  Discharge Instructions: Thank you for choosing [facility] to provide your oncology and hematology care.   If you have a lab appointment with the Cancer Center, please go directly to the Cancer Center and check in at the registration area.   Wear comfortable clothing and clothing appropriate for easy access to any Portacath or PICC line.   We strive to give you quality time with your provider. You may need to reschedule your appointment if you arrive late (15 or more minutes).  Arriving late affects you and other patients whose appointments are after yours.  Also, if you miss three or more appointments without notifying the office, you may be dismissed from the clinic at the provider's discretion.      For prescription refill requests, have your pharmacy contact our office and allow 72 hours for refills to be completed.    Today you received the following chemotherapy and/or immunotherapy agents: Oxaliplatin.       To help prevent nausea and vomiting after your treatment, we encourage you to take your nausea medication as directed.  BELOW ARE SYMPTOMS THAT SHOULD BE REPORTED IMMEDIATELY: *FEVER GREATER THAN 100.4 F (38 C) OR HIGHER *CHILLS OR SWEATING *NAUSEA AND VOMITING THAT IS NOT CONTROLLED WITH YOUR NAUSEA MEDICATION *UNUSUAL SHORTNESS OF BREATH *UNUSUAL BRUISING OR BLEEDING *URINARY PROBLEMS (pain or burning when urinating, or frequent urination) *BOWEL PROBLEMS (unusual diarrhea, constipation, pain near the anus) TENDERNESS IN MOUTH AND THROAT WITH OR WITHOUT PRESENCE OF ULCERS (sore throat, sores in mouth, or a toothache) UNUSUAL RASH, SWELLING OR PAIN  UNUSUAL VAGINAL DISCHARGE OR ITCHING   Items with * indicate a potential emergency and should be followed up as soon as possible or go to the Emergency Department if any problems should occur.  Please show the CHEMOTHERAPY ALERT CARD or IMMUNOTHERAPY ALERT CARD at check-in  to the Emergency Department and triage nurse.  Should you have questions after your visit or need to cancel or reschedule your appointment, please contact [facility]  Dept: [phone]  and follow the prompts.  Office hours are 8:00 a.m. to 4:30 p.m. Monday - Friday. Please note that voicemails left after 4:00 p.m. may not be returned until the following business day.  We are closed weekends and major holidays. You have access to a nurse at all times for urgent questions. Please call the main number to the clinic Dept: [phone] and follow the prompts.   For any non-urgent questions, you may also contact your provider using MyChart. We now offer e-Visits for anyone 18 and older to request care online for non-urgent symptoms. For details visit [email or web address].   Also download the MyChart app! Go to the app store, search "MyChart", open the app, select , and log in with your MyChart username and password.  Due to Covid, a mask is required upon entering the hospital/clinic. If you do not have a mask, one will be given to you upon arrival. For doctor visits, patients may have 1 support person aged 18 or older with them. For treatment visits, patients cannot have anyone with them due to current Covid guidelines and our immunocompromised population.   

## 2021-11-29 NOTE — Progress Notes (Signed)
Per Burr Medico MD, ok to treat with HR 110.  ? ?Per MD run D5 with Oxaliplatin at same rate to reduce peripheral irritation. Continue to run D5 for 15 minutes post-infusion.  ?

## 2021-11-29 NOTE — Progress Notes (Signed)
?[facility]   ?Telephone:(336) [phone] [phone]   ?Clinic Follow up Note  ? ?Patient Care Team: ?[facility] as PCP - General (Family Medicine) ?Ralene Ok, MD as Consulting Physician (General Surgery) ?Alla Feeling, NP as Nurse Practitioner (Nurse Practitioner) ?Truitt Merle, MD as Consulting Physician (Hematology) ?Mohammed Kindle, MD as Referring Physician (Pain Medicine) ? ?Date of Service:  11/29/2021 ? ?CHIEF COMPLAINT: f/u of colon cancer ? ?CURRENT THERAPY:  ?Adjuvant CAPOX q3 weeks x6 months, starting 09/11/21 ? ?ASSESSMENT & PLAN:  ?Joseph Haney is a 46 y.o. male with  ? ?1. Moderately differentiated adenocarcinoma of ascending colon, pT4apN2bM0, stage IIIC, MMR normal, MSI-stable ?-he presented with N/V and abdominal pain, and IDA. Work up showed adenocarcinoma in the ascending colon. Staging work up was negative for metastatic disease. Baseline CEA is normal.  ?-s/p partial colectomy by Dr. Rosendo Gros 07/23/21, final surgical path showed pT4a lesion with LVI and PNI, no perforation, and clear margins. There was metastatic disease in 9/19 LNs, pN2b ?-Due to the high risk for recurrence in stage IIIC colon, we recommend adjuvant chemotherapy. The goal of adjuvant chemotherapy is curative. ?-He began Capox 09/11/2021. Following cycle 2, he experienced prolonged and moderate/severe nausea with vomiting and headaches after cycle 2, and cycle 3 chemo was postponed for a week ?-I reduced oxaliplatin chemo dose and added iv Emend from cycle 3, but he still had significant nausea with vomiting and headaches. ?-we again delayed cycle 4 oxali by a week; he is scheduled to receive today. Labs reviewed, adequate to proceed. I called in decadron for him to take following infusion.  We will slightly reduce oxaliplatin dose further. ?  ?2. Chemo Side Effects: Nausea, Vomiting, Headaches ?-he experienced increased nausea with vomiting and headaches after infusion  since cycle 2 ?-he has Zofran ODT, Compazine, and Phenergan as needed ?-He will take dexamethasone 4 mg daily for 3 to 5 days post chemo ?  ?3. Iron deficiency anemia, secondary to #1 ?-he presented to ED with acute on chronic anemia (hgb range 9-12 since 2017), with hgb 7 and ferritin 2 ?-he was started on oral iron, tolerating. ?-He received Monoferric 09/11/21, will schedule as needed in the future ?  ?4. Seizure disorder and h/o alcohol abuse ?-on Keppra, no recent seizure in 2022.  ?-he quit drinking at age 69 and continues alcohol cessation ?  ?5. Social ?-patient is not working, and does not drive due to seizure disorder.  ?-he has medicaid  ?-he has 3 children ages 50, 36, 81 ?-has strong family support with wife and mother  ?  ?6. Genetics ?-patient's disease is MMR normal, MSI-stable. Not likely lynch syndrome. However, due to his young age, and a cousin with prostate cancer in his 59's, we strongly encouraged him to undergo genetic testing ?-he understands his children will need to begin screening at or before age 54, or sooner if he has genetic mutation ?-he is scheduled for genetic counseling on 12/10/21 ?  ?7. 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  ?  ?PLAN: ?-proceed to cycle 4 oxali today with further dose reduction to 100 mg/m? ?-continue Xeloda 1522m BID for 14 days  ?-I called in Zofran ODT, decadron, compazine, and phenergan today  ?-genetic counseling 12/10/21 ?-lab, f/u, and oxali in 3 and 6 weeks ? ? ?No problem-specific Assessment & Plan notes found for this encounter. ? ? ?SUMMARY OF  ONCOLOGIC HISTORY: ?Oncology History  ?Cancer of right colon ([place])  ?07/19/2021 Imaging  ? CT AP IMPRESSION: ?Focal area of high-grade narrowing of the proximal ascending colon ?may be sequela of chronic inflammation and stricture versus a mass. ?There is mild distension of the cecum and terminal ileum. Clinical ?correlation and  further evaluation with lower GI study or ?colonoscopy recommended. ?  ?07/22/2021 Procedure  ? Colonoscopy A severe stenosis was found in the distal ascending colon and was non-traversed. It was ulcerated and inflamed. This could be inflammatory versus malignant stricture. ?  ?07/22/2021 Initial Biopsy  ? FINAL MICROSCOPIC DIAGNOSIS:  ?A. COLON, ASCENDING, BIOPSY:  ?- Invasive colorectal adenocarcinoma, moderately differentiated.  ?  ?07/23/2021 Definitive Surgery  ? PRE-OPERATIVE DIAGNOSIS:  right colon mass ?POST-OPERATIVE DIAGNOSIS:  right colon mass ?PROCEDURE:  Procedure(s): ?XI ROBOT ASSISTED LAPAROSCOPIC RIGHT COLECTOMY (N/A) ?Dr. Rosendo Gros ?  ?07/23/2021 Pathology Results  ? FINAL MICROSCOPIC DIAGNOSIS:  ?A. COLON, RIGHT, RESECTION:  ?- Invasive moderately differentiated colonic adenocarcinoma, 4.5 cm.  ?- Carcinoma extends into pericolonic connective tissue and focally  ?involves serosal surface.  ?- Margins not involved.  ?- Metastatic carcinoma in nine of nineteen lymph nodes (9/19).  ?- Unremarkable appendix. ?MMR normal, MSI-stable  ?  ?07/23/2021 Cancer Staging  ? Staging form: Colon and Rectum, AJCC 8th Edition ?- Pathologic stage from 07/23/2021: Stage IIIC (pT4a, pN2b, cM0) - Signed by Alla Feeling, NP on 08/15/2021 ?Stage prefix: Initial diagnosis ?Histologic grading system: 4 grade system ?Histologic grade (G): G2 ?Laterality: Right ?Lymph-vascular invasion (LVI): LVI present/identified, NOS ?Perineural invasion (PNI): Present ?Microsatellite instability (MSI): Stable ? ?  ?07/24/2021 Initial Diagnosis  ? Colon adenocarcinoma ([place]) ?  ?07/25/2021 Tumor Marker  ? CEA: 2.9 ?  ?07/28/2021 Imaging  ? CT chest IMPRESSION: ?1. No evidence of metastatic disease within the chest. ?2. Lungs demonstrate atelectasis, most evident lower lobes and bases ?of the left upper lobe lingula and right middle lobe. No convincing ?pneumonia and no evidence of pulmonary edema. ?  ?  ?09/11/2021 -  Chemotherapy  ?  Patient is on Treatment Plan : COLORECTAL Xelox (Capeox) q21d  ?   ? ? ? ?INTERVAL HISTORY:  ?Joseph Haney is here for a follow up of colon cancer. He was last seen by me on 11/05/21. He presents to the clinic alone. ?He reports last cycle was rough-- he reports headaches and nausea for a week after infusion, as well as some constipation. Fortunately, he has not lost weight. ?  ?All other systems were reviewed with the patient and are negative. ? ?MEDICAL HISTORY:  ?Past Medical History:  ?Diagnosis Date  ? Alcohol abuse   ? GERD (gastroesophageal reflux disease)   ? Seizure ([place])   ? ? ?SURGICAL HISTORY: ?Past Surgical History:  ?Procedure Laterality Date  ? BIOPSY  07/22/2021  ? Procedure: BIOPSY;  Surgeon: [name], MD;  Location: [facility];  Service: Gastroenterology;;  ? COLONOSCOPY WITH PROPOFOL N/A 07/22/2021  ? Procedure: COLONOSCOPY WITH PROPOFOL;  Surgeon: [name], MD;  Location: [facility];  Service: Gastroenterology;  Laterality: N/A;  ? NO PAST SURGERIES    ? SUBMUCOSAL TATTOO INJECTION  07/22/2021  ? Procedure: SUBMUCOSAL TATTOO INJECTION;  Surgeon: [name], MD;  Location: [facility];  Service: Gastroenterology;;  ? ? ?I have reviewed the social history and family history with the patient and they are unchanged from previous note. ? ?ALLERGIES:  is allergic to morphine and related. ? ?MEDICATIONS:  ?Current Outpatient Medications  ?Medication Sig Dispense Refill  ?  bacitracin ointment Apply 1 application topically 2 (two) times daily. 14 g 0  ? capecitabine (XELODA) 500 MG tablet Take 3 tablets (1,500 mg total) by mouth 2 (two) times daily after a meal. Take for 14 days on, 7 days off. Repeat every 21 days. 84 tablet 1  ? carvedilol (COREG) 25 MG tablet Take 1 tablet (25 mg total) by mouth 2 (two) times daily with a meal. 60 tablet 2  ? cyclobenzaprine (FLEXERIL) 10 MG tablet Take 10-20 mg by mouth 2 (two) times daily as needed for muscle spasms.    ? dexamethasone  (DECADRON) 4 MG tablet Take 1 tablet (4 mg total) by mouth daily. Take for 3-5 days after chemo for nausea and fatigue 20 tablet 0  ? docusate sodium (COLACE) 100 MG capsule Take 100 mg by mouth daily. (Pa

## 2021-12-04 ENCOUNTER — Other Ambulatory Visit (HOSPITAL_COMMUNITY): Payer: Self-pay

## 2021-12-09 DIAGNOSIS — G8929 Other chronic pain: Secondary | ICD-10-CM | POA: Diagnosis not present

## 2021-12-09 DIAGNOSIS — G894 Chronic pain syndrome: Secondary | ICD-10-CM | POA: Diagnosis not present

## 2021-12-09 DIAGNOSIS — M5416 Radiculopathy, lumbar region: Secondary | ICD-10-CM | POA: Diagnosis not present

## 2021-12-09 DIAGNOSIS — M5136 Other intervertebral disc degeneration, lumbar region: Secondary | ICD-10-CM | POA: Diagnosis not present

## 2021-12-10 ENCOUNTER — Inpatient Hospital Stay: Payer: Medicaid Other | Attending: Nurse Practitioner | Admitting: Genetic Counselor

## 2021-12-10 DIAGNOSIS — G629 Polyneuropathy, unspecified: Secondary | ICD-10-CM | POA: Insufficient documentation

## 2021-12-10 DIAGNOSIS — G40909 Epilepsy, unspecified, not intractable, without status epilepticus: Secondary | ICD-10-CM | POA: Insufficient documentation

## 2021-12-10 DIAGNOSIS — C182 Malignant neoplasm of ascending colon: Secondary | ICD-10-CM | POA: Insufficient documentation

## 2021-12-10 DIAGNOSIS — Z5111 Encounter for antineoplastic chemotherapy: Secondary | ICD-10-CM | POA: Insufficient documentation

## 2021-12-10 DIAGNOSIS — Z79899 Other long term (current) drug therapy: Secondary | ICD-10-CM | POA: Insufficient documentation

## 2021-12-10 DIAGNOSIS — Z8042 Family history of malignant neoplasm of prostate: Secondary | ICD-10-CM | POA: Insufficient documentation

## 2021-12-10 DIAGNOSIS — D509 Iron deficiency anemia, unspecified: Secondary | ICD-10-CM | POA: Insufficient documentation

## 2021-12-17 ENCOUNTER — Other Ambulatory Visit (HOSPITAL_COMMUNITY): Payer: Self-pay

## 2021-12-18 ENCOUNTER — Telehealth: Payer: Self-pay

## 2021-12-18 ENCOUNTER — Other Ambulatory Visit: Payer: Self-pay

## 2021-12-18 DIAGNOSIS — C189 Malignant neoplasm of colon, unspecified: Secondary | ICD-10-CM

## 2021-12-18 DIAGNOSIS — C182 Malignant neoplasm of ascending colon: Secondary | ICD-10-CM

## 2021-12-18 NOTE — Telephone Encounter (Signed)
Pt LVM stating he would like to have a PortaCath placed.  Called pt back got voicemail.  Left message stating Dr. Ernestina Penna office received his voicemail regarding having a Port placed.  Instructed pt to give Dr. Ernestina Penna office a return call so we can further discuss his decision.   ?

## 2021-12-19 ENCOUNTER — Other Ambulatory Visit (HOSPITAL_COMMUNITY): Payer: Self-pay

## 2021-12-19 ENCOUNTER — Other Ambulatory Visit: Payer: Self-pay

## 2021-12-19 ENCOUNTER — Other Ambulatory Visit: Payer: Self-pay | Admitting: Hematology

## 2021-12-24 ENCOUNTER — Inpatient Hospital Stay: Payer: Medicaid Other

## 2021-12-24 ENCOUNTER — Inpatient Hospital Stay (HOSPITAL_BASED_OUTPATIENT_CLINIC_OR_DEPARTMENT_OTHER): Payer: Medicaid Other

## 2021-12-24 ENCOUNTER — Inpatient Hospital Stay (HOSPITAL_BASED_OUTPATIENT_CLINIC_OR_DEPARTMENT_OTHER): Payer: Medicaid Other | Admitting: Hematology

## 2021-12-24 ENCOUNTER — Encounter: Payer: Self-pay | Admitting: Hematology

## 2021-12-24 ENCOUNTER — Other Ambulatory Visit: Payer: Self-pay

## 2021-12-24 VITALS — BP 148/107 | HR 98 | Temp 97.6°F | Resp 17 | Ht 69.0 in | Wt 171.1 lb

## 2021-12-24 VITALS — BP 139/93

## 2021-12-24 DIAGNOSIS — G40909 Epilepsy, unspecified, not intractable, without status epilepticus: Secondary | ICD-10-CM | POA: Diagnosis not present

## 2021-12-24 DIAGNOSIS — Z5111 Encounter for antineoplastic chemotherapy: Secondary | ICD-10-CM | POA: Diagnosis not present

## 2021-12-24 DIAGNOSIS — Z8042 Family history of malignant neoplasm of prostate: Secondary | ICD-10-CM | POA: Diagnosis not present

## 2021-12-24 DIAGNOSIS — C182 Malignant neoplasm of ascending colon: Secondary | ICD-10-CM

## 2021-12-24 DIAGNOSIS — C189 Malignant neoplasm of colon, unspecified: Secondary | ICD-10-CM

## 2021-12-24 DIAGNOSIS — D509 Iron deficiency anemia, unspecified: Secondary | ICD-10-CM | POA: Diagnosis not present

## 2021-12-24 DIAGNOSIS — G629 Polyneuropathy, unspecified: Secondary | ICD-10-CM | POA: Diagnosis not present

## 2021-12-24 DIAGNOSIS — Z79899 Other long term (current) drug therapy: Secondary | ICD-10-CM | POA: Diagnosis not present

## 2021-12-24 LAB — CMP (CANCER CENTER ONLY)
ALT: 16 U/L (ref 0–44)
AST: 21 U/L (ref 15–41)
Albumin: 4.1 g/dL (ref 3.5–5.0)
Alkaline Phosphatase: 92 U/L (ref 38–126)
Anion gap: 7 (ref 5–15)
BUN: 12 mg/dL (ref 6–20)
CO2: 27 mmol/L (ref 22–32)
Calcium: 9.2 mg/dL (ref 8.9–10.3)
Chloride: 105 mmol/L (ref 98–111)
Creatinine: 0.89 mg/dL (ref 0.61–1.24)
GFR, Estimated: >60 mL/min (ref >60)
Glucose, Bld: 107 mg/dL — ABNORMAL HIGH (ref 70–99)
Potassium: 3.7 mmol/L (ref 3.5–5.1)
Sodium: 139 mmol/L (ref 135–145)
Total Bilirubin: 0.3 mg/dL (ref 0.3–1.2)
Total Protein: 8.2 g/dL — ABNORMAL HIGH (ref 6.5–8.1)

## 2021-12-24 LAB — CBC WITH DIFFERENTIAL (CANCER CENTER ONLY)
Abs Immature Granulocytes: 0.03 10*3/uL (ref 0.00–0.07)
Basophils Absolute: 0 10*3/uL (ref 0.0–0.1)
Basophils Relative: 1 %
Eosinophils Absolute: 0.1 10*3/uL (ref 0.0–0.5)
Eosinophils Relative: 1 %
HCT: 38.3 % — ABNORMAL LOW (ref 39.0–52.0)
Hemoglobin: 13 g/dL (ref 13.0–17.0)
Immature Granulocytes: 1 %
Lymphocytes Relative: 43 %
Lymphs Abs: 2.6 10*3/uL (ref 0.7–4.0)
MCH: 29.7 pg (ref 26.0–34.0)
MCHC: 33.9 g/dL (ref 30.0–36.0)
MCV: 87.4 fL (ref 80.0–100.0)
Monocytes Absolute: 0.4 10*3/uL (ref 0.1–1.0)
Monocytes Relative: 7 %
Neutro Abs: 2.9 10*3/uL (ref 1.7–7.7)
Neutrophils Relative %: 47 %
Platelet Count: 169 10*3/uL (ref 150–400)
RBC: 4.38 MIL/uL (ref 4.22–5.81)
RDW: 17.2 % — ABNORMAL HIGH (ref 11.5–15.5)
WBC Count: 6 10*3/uL (ref 4.0–10.5)
nRBC: 0 % (ref 0.0–0.2)

## 2021-12-24 LAB — CEA (IN HOUSE-CHCC): CEA (CHCC-In House): 1.65 ng/mL (ref 0.00–5.00)

## 2021-12-24 MED ORDER — OXALIPLATIN CHEMO INJECTION 100 MG/20ML
85.0000 mg/m2 | Freq: Once | INTRAVENOUS | Status: AC
  Administered 2021-12-24: 165 mg via INTRAVENOUS
  Filled 2021-12-24: qty 33

## 2021-12-24 MED ORDER — PALONOSETRON HCL INJECTION 0.25 MG/5ML
0.2500 mg | Freq: Once | INTRAVENOUS | Status: AC
  Administered 2021-12-24: 0.25 mg via INTRAVENOUS
  Filled 2021-12-24: qty 5

## 2021-12-24 MED ORDER — SODIUM CHLORIDE 0.9 % IV SOLN
150.0000 mg | Freq: Once | INTRAVENOUS | Status: AC
  Administered 2021-12-24: 150 mg via INTRAVENOUS
  Filled 2021-12-24: qty 150

## 2021-12-24 MED ORDER — SODIUM CHLORIDE 0.9 % IV SOLN
10.0000 mg | Freq: Once | INTRAVENOUS | Status: AC
  Administered 2021-12-24: 10 mg via INTRAVENOUS
  Filled 2021-12-24: qty 10

## 2021-12-24 MED ORDER — CARVEDILOL 25 MG PO TABS: 25.0000 mg | ORAL_TABLET | Freq: Two times a day (BID) | ORAL | 0 refills | Status: DC

## 2021-12-24 MED ORDER — DEXTROSE 5 % IV SOLN: Freq: Once | INTRAVENOUS | Status: AC

## 2021-12-24 NOTE — Progress Notes (Signed)
Per Burr Medico MD, ok to treat today with blood pressure of 139/93. ?

## 2021-12-24 NOTE — Patient Instructions (Signed)
[facility]  Discharge Instructions: Thank you for choosing [facility] to provide your oncology and hematology care.   If you have a lab appointment with the Cancer Center, please go directly to the Cancer Center and check in at the registration area.   Wear comfortable clothing and clothing appropriate for easy access to any Portacath or PICC line.   We strive to give you quality time with your provider. You may need to reschedule your appointment if you arrive late (15 or more minutes).  Arriving late affects you and other patients whose appointments are after yours.  Also, if you miss three or more appointments without notifying the office, you may be dismissed from the clinic at the provider's discretion.      For prescription refill requests, have your pharmacy contact our office and allow 72 hours for refills to be completed.    Today you received the following chemotherapy and/or immunotherapy agents: Oxaliplatin.       To help prevent nausea and vomiting after your treatment, we encourage you to take your nausea medication as directed.  BELOW ARE SYMPTOMS THAT SHOULD BE REPORTED IMMEDIATELY: *FEVER GREATER THAN 100.4 F (38 C) OR HIGHER *CHILLS OR SWEATING *NAUSEA AND VOMITING THAT IS NOT CONTROLLED WITH YOUR NAUSEA MEDICATION *UNUSUAL SHORTNESS OF BREATH *UNUSUAL BRUISING OR BLEEDING *URINARY PROBLEMS (pain or burning when urinating, or frequent urination) *BOWEL PROBLEMS (unusual diarrhea, constipation, pain near the anus) TENDERNESS IN MOUTH AND THROAT WITH OR WITHOUT PRESENCE OF ULCERS (sore throat, sores in mouth, or a toothache) UNUSUAL RASH, SWELLING OR PAIN  UNUSUAL VAGINAL DISCHARGE OR ITCHING   Items with * indicate a potential emergency and should be followed up as soon as possible or go to the Emergency Department if any problems should occur.  Please show the CHEMOTHERAPY ALERT CARD or IMMUNOTHERAPY ALERT CARD at check-in  to the Emergency Department and triage nurse.  Should you have questions after your visit or need to cancel or reschedule your appointment, please contact [facility]  Dept: [phone]  and follow the prompts.  Office hours are 8:00 a.m. to 4:30 p.m. Monday - Friday. Please note that voicemails left after 4:00 p.m. may not be returned until the following business day.  We are closed weekends and major holidays. You have access to a nurse at all times for urgent questions. Please call the main number to the clinic Dept: [phone] and follow the prompts.   For any non-urgent questions, you may also contact your provider using MyChart. We now offer e-Visits for anyone 18 and older to request care online for non-urgent symptoms. For details visit [email or web address].   Also download the MyChart app! Go to the app store, search "MyChart", open the app, select [facility], and log in with your MyChart username and password.  Due to Covid, a mask is required upon entering the hospital/clinic. If you do not have a mask, one will be given to you upon arrival. For doctor visits, patients may have 1 support person aged 18 or older with them. For treatment visits, patients cannot have anyone with them due to current Covid guidelines and our immunocompromised population.   

## 2021-12-24 NOTE — Progress Notes (Signed)
Pt brought 4% lidocaine cream with him to his inf appt today. Per MD OK to use for pain during Oxaliplatin inf. ?

## 2021-12-24 NOTE — Progress Notes (Signed)
?[place]   ?[phone] [phone]   ?Clinic Follow up Note  ? ?Patient Care Team: ?[facility] as PCP - General (Family Medicine) ?Ralene Ok, MD as Consulting Physician (General Surgery) ?Alla Feeling, NP as Nurse Practitioner (Nurse Practitioner) ?Truitt Merle, MD as Consulting Physician (Hematology) ?Mohammed Kindle, MD as Referring Physician (Pain Medicine) ? ?Date of Service:  12/24/2021 ? ?CHIEF COMPLAINT: f/u of colon cancer ? ?CURRENT THERAPY:  ?Adjuvant CAPOX q3 weeks x6 months, starting 09/11/21 ? ?ASSESSMENT & PLAN:  ?Joseph Haney is a 46 y.o. male with  ? ?1. Moderately differentiated adenocarcinoma of ascending colon, pT4apN2bM0, stage IIIC, MMR normal, MSI-stable ?-he presented with N/V and abdominal pain, and IDA. Work up showed adenocarcinoma in the ascending colon. Staging work up was negative for metastatic disease. Baseline CEA is normal.  ?-s/p partial colectomy by Dr. Rosendo Gros 07/23/21, final surgical path showed pT4a lesion with LVI and PNI, no perforation, and clear margins. There was metastatic disease in 9/19 LNs, pN2b ?-Due to the high risk for recurrence in stage IIIC colon, we recommend adjuvant chemotherapy for 6 months. The goal of adjuvant chemotherapy is curative. ?-He began Capox 09/11/21. Following cycle 2, he experienced prolonged and moderate/severe nausea with vomiting and headaches after cycle 2, and cycle 3 chemo was postponed for a week ?-I reduced oxaliplatin chemo dose and added iv Emend from cycle 3, but he still had significant nausea with vomiting and headaches and slow recovery, required postpone of treatment  ?-he is scheduled to receive cycle 5 today. Labs reviewed, adequate to proceed.  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  Given his increasing side effects, I will further reduce his oxaliplatin dose to 85 mg/m? for today and next cycle, and plan to hold oxali for his last two cycles, and he will  continue Xeloda alone. ?  ?2. Chemo Side Effects: Nausea, Vomiting, Headaches, Neuropathy G1 ?-he experienced increased nausea with vomiting and headaches after infusion since cycle 2 ?-he has Zofran ODT, Compazine, and Phenergan as needed ?-He will take dexamethasone 4 mg daily for 3 to 5 days post chemo ?-he has very mild sensation decrease on tuning fork exam today (12/24/21) ?  ?3. Iron deficiency anemia, secondary to #1 ?-he presented to ED with acute on chronic anemia (hgb range 9-12 since 2017), with hgb 7 and ferritin 2 ?-he was started on oral iron, tolerating. ?-He received Monoferric 09/11/21, will schedule as needed in the future ?  ?4. Seizure disorder and h/o alcohol abuse ?-on Keppra, no recent seizure in 2022.  ?-he quit drinking at age 13 and continues alcohol cessation ?  ?5. Social ?-patient is not working, and does not drive due to seizure disorder.  ?-he has medicaid  ?-he has 3 children ages 16, 62, 8 ?-has strong family support with wife and mother  ?  ?6. Genetics ?-patient's disease is MMR normal, MSI-stable. Not likely lynch syndrome. However, due to his young age, and a cousin with prostate cancer in his 43's, we strongly encouraged him to undergo genetic testing ?-he understands his children will need to begin screening at or before age 55, or sooner if he has genetic mutation ?-he is scheduled for genetic counseling on 12/10/21 ?  ?7. 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  ?  ?PLAN: ?-proceed to cycle 5 oxali today with further dose reduction to 81m/m2, plan to hold Oxali after  C6  ?-continue Xeloda 1572m BID for 14 days  ?-I refilled his coreg for 2 weeks so he can call his PCP for refills  ?-lab and f/u in 3 weeks  ? ? ?No problem-specific Assessment & Plan notes found for this encounter. ? ? ?SUMMARY OF ONCOLOGIC HISTORY: ?Oncology History  ?Cancer of right colon ([facility]  ?07/19/2021 Imaging  ? CT AP  IMPRESSION: ?Focal area of high-grade narrowing of the proximal ascending colon ?may be sequela of chronic inflammation and stricture versus a mass. ?There is mild distension of the cecum and terminal ileum. Clinical ?correlation and further evaluation with lower GI study or ?colonoscopy recommended. ?  ?07/22/2021 Procedure  ? Colonoscopy A severe stenosis was found in the distal ascending colon and was non-traversed. It was ulcerated and inflamed. This could be inflammatory versus malignant stricture. ?  ?07/22/2021 Initial Biopsy  ? FINAL MICROSCOPIC DIAGNOSIS:  ?A. COLON, ASCENDING, BIOPSY:  ?- Invasive colorectal adenocarcinoma, moderately differentiated.  ?  ?07/23/2021 Definitive Surgery  ? PRE-OPERATIVE DIAGNOSIS:  right colon mass ?POST-OPERATIVE DIAGNOSIS:  right colon mass ?PROCEDURE:  Procedure(s): ?XI ROBOT ASSISTED LAPAROSCOPIC RIGHT COLECTOMY (N/A) ?Dr. RRosendo Gros?  ?07/23/2021 Pathology Results  ? FINAL MICROSCOPIC DIAGNOSIS:  ?A. COLON, RIGHT, RESECTION:  ?- Invasive moderately differentiated colonic adenocarcinoma, 4.5 cm.  ?- Carcinoma extends into pericolonic connective tissue and focally  ?involves serosal surface.  ?- Margins not involved.  ?- Metastatic carcinoma in nine of nineteen lymph nodes (9/19).  ?- Unremarkable appendix. ?MMR normal, MSI-stable  ?  ?07/23/2021 Cancer Staging  ? Staging form: Colon and Rectum, AJCC 8th Edition ?- Pathologic stage from 07/23/2021: Stage IIIC (pT4a, pN2b, cM0) - Signed by BAlla Feeling NP on 08/15/2021 ?Stage prefix: Initial diagnosis ?Histologic grading system: 4 grade system ?Histologic grade (G): G2 ?Laterality: Right ?Lymph-vascular invasion (LVI): LVI present/identified, NOS ?Perineural invasion (PNI): Present ?Microsatellite instability (MSI): Stable ? ?  ?07/24/2021 Initial Diagnosis  ? Colon adenocarcinoma ([facility] ? ?  ?07/25/2021 Tumor Marker  ? CEA: 2.9 ?  ?07/28/2021 Imaging  ? CT chest IMPRESSION: ?1. No evidence of metastatic disease within  the chest. ?2. Lungs demonstrate atelectasis, most evident lower lobes and bases ?of the left upper lobe lingula and right middle lobe. No convincing ?pneumonia and no evidence of pulmonary edema. ?  ?  ?09/11/2021 -  Chemotherapy  ? Patient is on Treatment Plan : COLORECTAL Xelox (Capeox) q21d  ? ?  ?  ? ? ? ?INTERVAL HISTORY:  ?CSavoy Somervilleis here for a follow up of colon cancer. He was last seen by me on 11/29/21. He presents to the clinic alone. ?He reports he needed 5-7 days to recover from last cycle. He also reports he had a lot of nausea. ?His BP is elevated today; he tells me he ran out of his BP medicine. ?  ?All other systems were reviewed with the patient and are negative. ? ?MEDICAL HISTORY:  ?Past Medical History:  ?Diagnosis Date  ? Alcohol abuse   ? GERD (gastroesophageal reflux disease)   ? Seizure ([place]   ? ? ?SURGICAL HISTORY: ?Past Surgical History:  ?Procedure Laterality Date  ? BIOPSY  07/22/2021  ? Procedure: BIOPSY;  Surgeon: BOtis Brace MD;  Location: [facility];  Service: Gastroenterology;;  ? COLONOSCOPY WITH PROPOFOL N/A 07/22/2021  ? Procedure: COLONOSCOPY WITH PROPOFOL;  Surgeon: BOtis Brace MD;  Location: [facility];  Service: Gastroenterology;  Laterality: N/A;  ? NO PAST SURGERIES    ? SUBMUCOSAL TATTOO INJECTION  07/22/2021  ?  Procedure: SUBMUCOSAL TATTOO INJECTION;  Surgeon: [name], MD;  Location: [facility];  Service: Gastroenterology;;  ? ? ?I have reviewed the social history and family history with the patient and they are unchanged from previous note. ? ?ALLERGIES:  is allergic to morphine and related. ? ?MEDICATIONS:  ?Current Outpatient Medications  ?Medication Sig Dispense Refill  ? bacitracin ointment Apply 1 application topically 2 (two) times daily. 14 g 0  ? capecitabine (XELODA) 500 MG tablet Take 3 tablets (1,500 mg total) by mouth 2 (two) times daily after a meal. Take for 14 days on, 7 days off. Repeat every 21 days. 84 tablet 1  ?  carvedilol (COREG) 25 MG tablet Take 1 tablet (25 mg total) by mouth 2 (two) times daily with a meal. 30 tablet 0  ? cyclobenzaprine (FLEXERIL) 10 MG tablet Take 10-20 mg by mouth 2 (two) times daily as n

## 2022-01-01 ENCOUNTER — Other Ambulatory Visit (HOSPITAL_COMMUNITY): Payer: Self-pay

## 2022-01-03 ENCOUNTER — Other Ambulatory Visit (HOSPITAL_COMMUNITY): Payer: Self-pay

## 2022-01-06 ENCOUNTER — Other Ambulatory Visit (HOSPITAL_COMMUNITY): Payer: Self-pay

## 2022-01-14 ENCOUNTER — Other Ambulatory Visit (HOSPITAL_COMMUNITY): Payer: Self-pay

## 2022-01-15 ENCOUNTER — Encounter: Payer: Self-pay | Admitting: Hematology

## 2022-01-15 ENCOUNTER — Inpatient Hospital Stay (HOSPITAL_BASED_OUTPATIENT_CLINIC_OR_DEPARTMENT_OTHER): Payer: Medicaid Other | Admitting: Hematology

## 2022-01-15 ENCOUNTER — Other Ambulatory Visit: Payer: Self-pay

## 2022-01-15 ENCOUNTER — Inpatient Hospital Stay: Payer: Medicaid Other

## 2022-01-15 ENCOUNTER — Inpatient Hospital Stay: Payer: Medicaid Other | Attending: Nurse Practitioner

## 2022-01-15 VITALS — BP 140/100 | HR 105 | Temp 98.4°F | Resp 18 | Ht 69.0 in | Wt 174.0 lb

## 2022-01-15 VITALS — HR 103

## 2022-01-15 DIAGNOSIS — C19 Malignant neoplasm of rectosigmoid junction: Secondary | ICD-10-CM | POA: Insufficient documentation

## 2022-01-15 DIAGNOSIS — C189 Malignant neoplasm of colon, unspecified: Secondary | ICD-10-CM | POA: Diagnosis not present

## 2022-01-15 DIAGNOSIS — C779 Secondary and unspecified malignant neoplasm of lymph node, unspecified: Secondary | ICD-10-CM | POA: Insufficient documentation

## 2022-01-15 DIAGNOSIS — Z5111 Encounter for antineoplastic chemotherapy: Secondary | ICD-10-CM | POA: Diagnosis not present

## 2022-01-15 DIAGNOSIS — C182 Malignant neoplasm of ascending colon: Secondary | ICD-10-CM

## 2022-01-15 DIAGNOSIS — D509 Iron deficiency anemia, unspecified: Secondary | ICD-10-CM | POA: Insufficient documentation

## 2022-01-15 LAB — CBC WITH DIFFERENTIAL (CANCER CENTER ONLY)
Abs Immature Granulocytes: 0.03 10*3/uL (ref 0.00–0.07)
Basophils Absolute: 0 10*3/uL (ref 0.0–0.1)
Basophils Relative: 1 %
Eosinophils Absolute: 0.1 10*3/uL (ref 0.0–0.5)
Eosinophils Relative: 1 %
HCT: 37.4 % — ABNORMAL LOW (ref 39.0–52.0)
Hemoglobin: 12.6 g/dL — ABNORMAL LOW (ref 13.0–17.0)
Immature Granulocytes: 1 %
Lymphocytes Relative: 44 %
Lymphs Abs: 1.7 10*3/uL (ref 0.7–4.0)
MCH: 30.8 pg (ref 26.0–34.0)
MCHC: 33.7 g/dL (ref 30.0–36.0)
MCV: 91.4 fL (ref 80.0–100.0)
Monocytes Absolute: 0.3 10*3/uL (ref 0.1–1.0)
Monocytes Relative: 9 %
Neutro Abs: 1.7 10*3/uL (ref 1.7–7.7)
Neutrophils Relative %: 44 %
Platelet Count: 258 10*3/uL (ref 150–400)
RBC: 4.09 MIL/uL — ABNORMAL LOW (ref 4.22–5.81)
RDW: 16.8 % — ABNORMAL HIGH (ref 11.5–15.5)
WBC Count: 3.9 10*3/uL — ABNORMAL LOW (ref 4.0–10.5)
nRBC: 0 % (ref 0.0–0.2)

## 2022-01-15 LAB — CMP (CANCER CENTER ONLY)
ALT: 17 U/L (ref 0–44)
AST: 23 U/L (ref 15–41)
Albumin: 4.1 g/dL (ref 3.5–5.0)
Alkaline Phosphatase: 92 U/L (ref 38–126)
Anion gap: 7 (ref 5–15)
BUN: 11 mg/dL (ref 6–20)
CO2: 27 mmol/L (ref 22–32)
Calcium: 9.2 mg/dL (ref 8.9–10.3)
Chloride: 105 mmol/L (ref 98–111)
Creatinine: 0.79 mg/dL (ref 0.61–1.24)
GFR, Estimated: >60 mL/min (ref >60)
Glucose, Bld: 103 mg/dL — ABNORMAL HIGH (ref 70–99)
Potassium: 3.7 mmol/L (ref 3.5–5.1)
Sodium: 139 mmol/L (ref 135–145)
Total Bilirubin: 0.4 mg/dL (ref 0.3–1.2)
Total Protein: 8.1 g/dL (ref 6.5–8.1)

## 2022-01-15 MED ORDER — PALONOSETRON HCL INJECTION 0.25 MG/5ML
0.2500 mg | Freq: Once | INTRAVENOUS | Status: AC
  Administered 2022-01-15: 0.25 mg via INTRAVENOUS
  Filled 2022-01-15: qty 5

## 2022-01-15 MED ORDER — CARVEDILOL 25 MG PO TABS: 25.0000 mg | ORAL_TABLET | Freq: Two times a day (BID) | ORAL | 0 refills | Status: DC

## 2022-01-15 MED ORDER — SODIUM CHLORIDE 0.9 % IV SOLN
10.0000 mg | Freq: Once | INTRAVENOUS | Status: AC
  Administered 2022-01-15: 10 mg via INTRAVENOUS
  Filled 2022-01-15: qty 10

## 2022-01-15 MED ORDER — CAPECITABINE 500 MG PO TABS
1000.0000 mg/m2 | ORAL_TABLET | Freq: Two times a day (BID) | ORAL | 1 refills | Status: DC
  Filled 2022-01-15 – 2022-01-30 (×2): qty 112, 14d supply, fill #0
  Filled 2022-02-13: qty 112, 21d supply, fill #1

## 2022-01-15 MED ORDER — SODIUM CHLORIDE 0.9 % IV SOLN
150.0000 mg | Freq: Once | INTRAVENOUS | Status: AC
  Administered 2022-01-15: 150 mg via INTRAVENOUS
  Filled 2022-01-15: qty 150

## 2022-01-15 MED ORDER — DEXTROSE 5 % IV SOLN: Freq: Once | INTRAVENOUS | Status: AC

## 2022-01-15 MED ORDER — OXALIPLATIN CHEMO INJECTION 100 MG/20ML
85.0000 mg/m2 | Freq: Once | INTRAVENOUS | Status: AC
  Administered 2022-01-15: 165 mg via INTRAVENOUS
  Filled 2022-01-15: qty 33

## 2022-01-15 NOTE — Progress Notes (Signed)
?[place]   ?[phone] [phone]   ?Clinic Follow up Note  ? ?Patient Care Team: ?[facility] as PCP - General (Family Medicine) ?Ralene Ok, MD as Consulting Physician (General Surgery) ?Alla Feeling, NP as Nurse Practitioner (Nurse Practitioner) ?Truitt Merle, MD as Consulting Physician (Hematology) ?Mohammed Kindle, MD as Referring Physician (Pain Medicine) ? ?Date of Service:  01/15/2022 ? ?CHIEF COMPLAINT: f/u of colon cancer ? ?CURRENT THERAPY:  ?Adjuvant CAPOX q3 weeks x6 months, starting 09/11/21 ? ?ASSESSMENT & PLAN:  ?Joseph Haney is a 46 y.o. male with  ? ?1. Moderately differentiated adenocarcinoma of ascending colon, pT4apN2bM0, stage IIIC, MMR normal, MSI-stable ?-he presented with N/V and abdominal pain, and IDA. Work up showed adenocarcinoma in the ascending colon. Staging work up was negative for metastatic disease. Baseline CEA is normal.  ?-s/p partial colectomy by Dr. Rosendo Gros 07/23/21, final surgical path showed pT4a lesion with LVI and PNI, no perforation, and clear margins. There was metastatic disease in 9/19 LNs, pN2b ?-Due to the high risk for recurrence in stage IIIC colon, we recommend adjuvant chemotherapy for 6 months. The goal of adjuvant chemotherapy is curative. ?-He began Capox 09/11/21. Following cycle 2, he experienced prolonged and moderate/severe nausea with vomiting and headaches after cycle 2, and cycle 3 chemo was postponed for a week ?-I reduced oxaliplatin chemo dose and added iv Emend from cycle 3, but he still had significant nausea with vomiting and headaches and slow recovery, required postpone of treatment  ?-labs reviewed, slightly decreased but adequate to proceed.  Peripheral neuropathy is mild.  Given his increasing side effects, plan to hold oxali and proceed with Xeloda alone for his last two cycles, and he will continue Xeloda alone. He will receive his last oxali today. ?-plan for  restaging CT in 5-6 weeks ?  ?2. Chemo Side Effects: Nausea, Vomiting, Headaches, Neuropathy G1 ?-he experienced increased nausea with vomiting and headaches after infusion since cycle 2 ?-he has Zofran ODT, Compazine, and Phenergan as needed ?-He will take dexamethasone 4 mg daily for 3 to 5 days post chemo ?-he has very mild sensation decrease on tuning fork exam 12/24/21 ?  ?3. Iron deficiency anemia, secondary to #1 ?-he presented to ED with acute on chronic anemia (hgb range 9-12 since 2017), with hgb 7 and ferritin 2 ?-he was started on oral iron, tolerating. ?-He received Monoferric 09/11/21, will schedule as needed in the future ?  ?4. Seizure disorder and h/o alcohol abuse ?-on Keppra, no recent seizure in 2022.  ?-he quit drinking at age 41 and continues alcohol cessation ?  ?5. Social ?-patient is not working, and does not drive due to seizure disorder.  ?-he has medicaid  ?-he has 3 children ages 8, 20, 5 ?-has strong family support with wife and mother  ?  ?6. Genetics ?-patient's disease is MMR normal, MSI-stable. Not likely lynch syndrome. However, due to his young age, and a cousin with prostate cancer in his 13's, we strongly encouraged him to undergo genetic testing ?-he understands his children will need to begin screening at or before age 24, or sooner if he has genetic mutation ?-he did not show for genetic counseling on 12/10/21 ?  ?7. 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  ?  ?PLAN: ?-proceed to C6 (final) oxali today at same dose ?-continue Xeloda 1523m BID for 14 days  ?-I refilled his  coreg for 2 weeks while he awaits PCP appointment  ?-He will have 2 more cycle chemo with Xeloda alone at increased dose 2062m q12h for day 1-14 every 21 days, refilled today  ?-lab and f/u in 3 weeks with APP and 6 weeks with me ?-restaging CT in 5-6 weeks before visit with me in 6 weeks  ? ? ?No problem-specific  Assessment & Plan notes found for this encounter. ? ? ?SUMMARY OF ONCOLOGIC HISTORY: ?Oncology History  ?Cancer of right colon ([place]  ?07/19/2021 Imaging  ? CT AP IMPRESSION: ?Focal area of high-grade narrowing of the proximal ascending colon ?may be sequela of chronic inflammation and stricture versus a mass. ?There is mild distension of the cecum and terminal ileum. Clinical ?correlation and further evaluation with lower GI study or ?colonoscopy recommended. ?  ?07/22/2021 Procedure  ? Colonoscopy A severe stenosis was found in the distal ascending colon and was non-traversed. It was ulcerated and inflamed. This could be inflammatory versus malignant stricture. ?  ?07/22/2021 Initial Biopsy  ? FINAL MICROSCOPIC DIAGNOSIS:  ?A. COLON, ASCENDING, BIOPSY:  ?- Invasive colorectal adenocarcinoma, moderately differentiated.  ?  ?07/23/2021 Definitive Surgery  ? PRE-OPERATIVE DIAGNOSIS:  right colon mass ?POST-OPERATIVE DIAGNOSIS:  right colon mass ?PROCEDURE:  Procedure(s): ?XI ROBOT ASSISTED LAPAROSCOPIC RIGHT COLECTOMY (N/A) ?Dr. RRosendo Gros?  ?07/23/2021 Pathology Results  ? FINAL MICROSCOPIC DIAGNOSIS:  ?A. COLON, RIGHT, RESECTION:  ?- Invasive moderately differentiated colonic adenocarcinoma, 4.5 cm.  ?- Carcinoma extends into pericolonic connective tissue and focally  ?involves serosal surface.  ?- Margins not involved.  ?- Metastatic carcinoma in nine of nineteen lymph nodes (9/19).  ?- Unremarkable appendix. ?MMR normal, MSI-stable  ?  ?07/23/2021 Cancer Staging  ? Staging form: Colon and Rectum, AJCC 8th Edition ?- Pathologic stage from 07/23/2021: Stage IIIC (pT4a, pN2b, cM0) - Signed by BAlla Feeling NP on 08/15/2021 ?Stage prefix: Initial diagnosis ?Histologic grading system: 4 grade system ?Histologic grade (G): G2 ?Laterality: Right ?Lymph-vascular invasion (LVI): LVI present/identified, NOS ?Perineural invasion (PNI): Present ?Microsatellite instability (MSI): Stable ? ?  ?07/24/2021 Initial Diagnosis  ?  Colon adenocarcinoma ([facility] ? ?  ?07/25/2021 Tumor Marker  ? CEA: 2.9 ?  ?07/28/2021 Imaging  ? CT chest IMPRESSION: ?1. No evidence of metastatic disease within the chest. ?2. Lungs demonstrate atelectasis, most evident lower lobes and bases ?of the left upper lobe lingula and right middle lobe. No convincing ?pneumonia and no evidence of pulmonary edema. ?  ?  ?09/11/2021 -  Chemotherapy  ? Patient is on Treatment Plan : COLORECTAL Xelox (Capeox) q21d  ? ?   ? ? ? ?INTERVAL HISTORY:  ?CDarreld Hofferis here for a follow up of colon cancer. He was last seen by me on 12/24/21. He presents to the clinic alone. ?He reports he tolerated this past cycle better than the one before. He denies any residual numbness/tingling. He reports headaches for a few days after infusion. ?He notes he felt a knot in his abdomen since his last visit, but this has resolved on its own. ?He notes he ran out of his BP medicine again because his appointment with his PCP is next week. ?  ?All other systems were reviewed with the patient and are negative. ? ?MEDICAL HISTORY:  ?Past Medical History:  ?Diagnosis Date  ? Alcohol abuse   ? GERD (gastroesophageal reflux disease)   ? Seizure ([place]   ? ? ?SURGICAL HISTORY: ?Past Surgical History:  ?Procedure Laterality Date  ? BIOPSY  07/22/2021  ? Procedure:  BIOPSY;  Surgeon: [name], MD;  Location: [facility];  Service: Gastroenterology;;  ? COLONOSCOPY WITH PROPOFOL N/A 07/22/2021  ? Procedure: COLONOSCOPY WITH PROPOFOL;  Surgeon: [name], MD;  Location: [facility];  Service: Gastroenterology;  Laterality: N/A;  ? NO PAST SURGERIES    ? SUBMUCOSAL TATTOO INJECTION  07/22/2021  ? Procedure: SUBMUCOSAL TATTOO INJECTION;  Surgeon: [name], MD;  Location: [facility];  Service: Gastroenterology;;  ? ? ?I have reviewed the social history and family history with the patient and they are unchanged from previous note. ? ?ALLERGIES:  is allergic to morphine and  related. ? ?MEDICATIONS:  ?Current Outpatient Medications  ?Medication Sig Dispense Refill  ? bacitracin ointment Apply 1 application topically 2 (two) times daily. 14 g 0  ? capecitabine (XELODA) 500 MG tablet Take 3 tablets (1

## 2022-01-15 NOTE — Progress Notes (Signed)
D1/C6 oxaliplatin.OK to treat w/ HR 103 per Dr. Burr Medico ?

## 2022-01-16 ENCOUNTER — Other Ambulatory Visit (HOSPITAL_COMMUNITY): Payer: Self-pay

## 2022-01-21 ENCOUNTER — Telehealth: Payer: Self-pay | Admitting: Hematology

## 2022-01-21 NOTE — Telephone Encounter (Signed)
Unable to leave message with follow-up appointments per 5/17 los. Mailed calendar.

## 2022-01-24 ENCOUNTER — Other Ambulatory Visit (HOSPITAL_COMMUNITY): Payer: Self-pay

## 2022-01-28 ENCOUNTER — Other Ambulatory Visit (HOSPITAL_COMMUNITY): Payer: Self-pay

## 2022-01-30 ENCOUNTER — Other Ambulatory Visit (HOSPITAL_COMMUNITY): Payer: Self-pay

## 2022-02-03 ENCOUNTER — Ambulatory Visit (HOSPITAL_COMMUNITY)
Admission: RE | Admit: 2022-02-03 | Discharge: 2022-02-03 | Disposition: A | Discharge status: Home / Self Care | Payer: Medicaid Other | Source: Ambulatory Visit | Attending: Hematology | Admitting: Hematology

## 2022-02-03 ENCOUNTER — Other Ambulatory Visit (HOSPITAL_COMMUNITY): Payer: Self-pay

## 2022-02-03 DIAGNOSIS — C189 Malignant neoplasm of colon, unspecified: Secondary | ICD-10-CM | POA: Diagnosis not present

## 2022-02-03 DIAGNOSIS — Z9889 Other specified postprocedural states: Secondary | ICD-10-CM | POA: Diagnosis not present

## 2022-02-03 DIAGNOSIS — I2510 Atherosclerotic heart disease of native coronary artery without angina pectoris: Secondary | ICD-10-CM | POA: Diagnosis not present

## 2022-02-03 DIAGNOSIS — C182 Malignant neoplasm of ascending colon: Secondary | ICD-10-CM | POA: Diagnosis not present

## 2022-02-03 DIAGNOSIS — J984 Other disorders of lung: Secondary | ICD-10-CM | POA: Diagnosis not present

## 2022-02-03 DIAGNOSIS — I700 Atherosclerosis of aorta: Secondary | ICD-10-CM | POA: Diagnosis not present

## 2022-02-03 MED ORDER — IOHEXOL 300 MG/ML  SOLN
100.0000 mL | Freq: Once | INTRAMUSCULAR | Status: AC | PRN
  Administered 2022-02-03: 100 mL via INTRAVENOUS

## 2022-02-03 MED ORDER — SODIUM CHLORIDE (PF) 0.9 % IJ SOLN
INTRAMUSCULAR | Status: AC
  Filled 2022-02-03: qty 50

## 2022-02-05 ENCOUNTER — Inpatient Hospital Stay: Payer: Medicaid Other

## 2022-02-05 ENCOUNTER — Inpatient Hospital Stay: Payer: Medicaid Other | Attending: Nurse Practitioner | Admitting: Oncology

## 2022-02-05 DIAGNOSIS — G40909 Epilepsy, unspecified, not intractable, without status epilepticus: Secondary | ICD-10-CM | POA: Insufficient documentation

## 2022-02-05 DIAGNOSIS — R112 Nausea with vomiting, unspecified: Secondary | ICD-10-CM | POA: Insufficient documentation

## 2022-02-05 DIAGNOSIS — M5430 Sciatica, unspecified side: Secondary | ICD-10-CM | POA: Insufficient documentation

## 2022-02-05 DIAGNOSIS — Z885 Allergy status to narcotic agent status: Secondary | ICD-10-CM | POA: Insufficient documentation

## 2022-02-05 DIAGNOSIS — G620 Drug-induced polyneuropathy: Secondary | ICD-10-CM | POA: Insufficient documentation

## 2022-02-05 DIAGNOSIS — D509 Iron deficiency anemia, unspecified: Secondary | ICD-10-CM | POA: Insufficient documentation

## 2022-02-05 DIAGNOSIS — Z79899 Other long term (current) drug therapy: Secondary | ICD-10-CM | POA: Insufficient documentation

## 2022-02-05 DIAGNOSIS — C182 Malignant neoplasm of ascending colon: Secondary | ICD-10-CM | POA: Insufficient documentation

## 2022-02-05 DIAGNOSIS — R519 Headache, unspecified: Secondary | ICD-10-CM | POA: Insufficient documentation

## 2022-02-05 DIAGNOSIS — Z7952 Long term (current) use of systemic steroids: Secondary | ICD-10-CM | POA: Insufficient documentation

## 2022-02-05 DIAGNOSIS — T451X5A Adverse effect of antineoplastic and immunosuppressive drugs, initial encounter: Secondary | ICD-10-CM | POA: Insufficient documentation

## 2022-02-05 DIAGNOSIS — Z9049 Acquired absence of other specified parts of digestive tract: Secondary | ICD-10-CM | POA: Insufficient documentation

## 2022-02-06 ENCOUNTER — Telehealth: Payer: Self-pay | Admitting: Hematology

## 2022-02-06 NOTE — Telephone Encounter (Signed)
.  Called patient to schedule appointment per 6/7 inbasket, patient is aware of date and time.   

## 2022-02-10 ENCOUNTER — Inpatient Hospital Stay: Payer: Medicaid Other

## 2022-02-10 ENCOUNTER — Other Ambulatory Visit: Payer: Self-pay

## 2022-02-10 ENCOUNTER — Inpatient Hospital Stay (HOSPITAL_BASED_OUTPATIENT_CLINIC_OR_DEPARTMENT_OTHER): Payer: Medicaid Other | Admitting: Oncology

## 2022-02-10 VITALS — BP 136/97 | HR 96 | Temp 98.4°F | Resp 18 | Ht 69.0 in | Wt 178.1 lb

## 2022-02-10 DIAGNOSIS — Z9049 Acquired absence of other specified parts of digestive tract: Secondary | ICD-10-CM | POA: Diagnosis not present

## 2022-02-10 DIAGNOSIS — Z885 Allergy status to narcotic agent status: Secondary | ICD-10-CM | POA: Diagnosis not present

## 2022-02-10 DIAGNOSIS — G40909 Epilepsy, unspecified, not intractable, without status epilepticus: Secondary | ICD-10-CM | POA: Diagnosis not present

## 2022-02-10 DIAGNOSIS — C182 Malignant neoplasm of ascending colon: Secondary | ICD-10-CM | POA: Diagnosis not present

## 2022-02-10 DIAGNOSIS — C189 Malignant neoplasm of colon, unspecified: Secondary | ICD-10-CM

## 2022-02-10 DIAGNOSIS — Z7952 Long term (current) use of systemic steroids: Secondary | ICD-10-CM | POA: Diagnosis not present

## 2022-02-10 DIAGNOSIS — R112 Nausea with vomiting, unspecified: Secondary | ICD-10-CM | POA: Diagnosis not present

## 2022-02-10 DIAGNOSIS — T451X5A Adverse effect of antineoplastic and immunosuppressive drugs, initial encounter: Secondary | ICD-10-CM | POA: Diagnosis not present

## 2022-02-10 DIAGNOSIS — M5430 Sciatica, unspecified side: Secondary | ICD-10-CM | POA: Diagnosis not present

## 2022-02-10 DIAGNOSIS — R519 Headache, unspecified: Secondary | ICD-10-CM | POA: Diagnosis not present

## 2022-02-10 DIAGNOSIS — D509 Iron deficiency anemia, unspecified: Secondary | ICD-10-CM | POA: Diagnosis not present

## 2022-02-10 DIAGNOSIS — G620 Drug-induced polyneuropathy: Secondary | ICD-10-CM | POA: Diagnosis not present

## 2022-02-10 DIAGNOSIS — Z79899 Other long term (current) drug therapy: Secondary | ICD-10-CM | POA: Diagnosis not present

## 2022-02-10 LAB — CMP (CANCER CENTER ONLY)
ALT: 20 U/L (ref 0–44)
AST: 27 U/L (ref 15–41)
Albumin: 4.3 g/dL (ref 3.5–5.0)
Alkaline Phosphatase: 81 U/L (ref 38–126)
Anion gap: 8 (ref 5–15)
BUN: 11 mg/dL (ref 6–20)
CO2: 27 mmol/L (ref 22–32)
Calcium: 9.9 mg/dL (ref 8.9–10.3)
Chloride: 105 mmol/L (ref 98–111)
Creatinine: 0.84 mg/dL (ref 0.61–1.24)
GFR, Estimated: >60 mL/min (ref >60)
Glucose, Bld: 108 mg/dL — ABNORMAL HIGH (ref 70–99)
Potassium: 3.9 mmol/L (ref 3.5–5.1)
Sodium: 140 mmol/L (ref 135–145)
Total Bilirubin: 0.4 mg/dL (ref 0.3–1.2)
Total Protein: 8.4 g/dL — ABNORMAL HIGH (ref 6.5–8.1)

## 2022-02-10 LAB — CBC WITH DIFFERENTIAL (CANCER CENTER ONLY)
Abs Immature Granulocytes: 0.05 10*3/uL (ref 0.00–0.07)
Basophils Absolute: 0 10*3/uL (ref 0.0–0.1)
Basophils Relative: 1 %
Eosinophils Absolute: 0.1 10*3/uL (ref 0.0–0.5)
Eosinophils Relative: 2 %
HCT: 38.9 % — ABNORMAL LOW (ref 39.0–52.0)
Hemoglobin: 13.4 g/dL (ref 13.0–17.0)
Immature Granulocytes: 1 %
Lymphocytes Relative: 38 %
Lymphs Abs: 2.1 10*3/uL (ref 0.7–4.0)
MCH: 32.2 pg (ref 26.0–34.0)
MCHC: 34.4 g/dL (ref 30.0–36.0)
MCV: 93.5 fL (ref 80.0–100.0)
Monocytes Absolute: 0.5 10*3/uL (ref 0.1–1.0)
Monocytes Relative: 8 %
Neutro Abs: 2.8 10*3/uL (ref 1.7–7.7)
Neutrophils Relative %: 50 %
Platelet Count: 172 10*3/uL (ref 150–400)
RBC: 4.16 MIL/uL — ABNORMAL LOW (ref 4.22–5.81)
RDW: 15.1 % (ref 11.5–15.5)
WBC Count: 5.5 10*3/uL (ref 4.0–10.5)
nRBC: 0 % (ref 0.0–0.2)

## 2022-02-10 LAB — CEA (IN HOUSE-CHCC): CEA (CHCC-In House): 1.84 ng/mL (ref 0.00–5.00)

## 2022-02-10 NOTE — Progress Notes (Signed)
[place]   Telephone:([phone] Fax:(336) [phone]   Clinic Follow up Note   Patient Care Team: [facility] as PCP - General (Family Medicine) Ralene Ok, MD as Consulting Physician (General Surgery) Alla Feeling, NP as Nurse Practitioner (Nurse Practitioner) Truitt Merle, MD as Consulting Physician (Hematology) Mohammed Kindle, MD as Referring Physician (Pain Medicine)  Date of Service:  02/10/2022  CHIEF COMPLAINT: f/u of colon cancer  CURRENT THERAPY:  Adjuvant CAPOX q3 weeks x6 months, starting 09/11/21  ASSESSMENT & PLAN:  Joseph Haney is a 46 y.o. male with   1. Moderately differentiated adenocarcinoma of ascending colon, pT4apN2bM0, stage IIIC, MMR normal, MSI-stable -he presented with N/V and abdominal pain, and IDA. Work up showed adenocarcinoma in the ascending colon. Staging work up was negative for metastatic disease. Baseline CEA is normal.  -s/p partial colectomy by Dr. Rosendo Gros 07/23/21, final surgical path showed pT4a lesion with LVI and PNI, no perforation, and clear margins. There was metastatic disease in 9/19 LNs, pN2b -Due to the high risk for recurrence in stage IIIC colon, we recommend adjuvant chemotherapy for 6 months. The goal of adjuvant chemotherapy is curative. -He began Capox 09/11/21. Following cycle 2, he experienced prolonged and moderate/severe nausea with vomiting and headaches after cycle 2, and cycle 3 chemo was postponed for a week -He received a total of 6 cycles of oxaliplatin last given on 01/15/2022 with Xeloda.  Oxaliplatin discontinued early secondary to significant nausea and vomiting with headaches.  -Plan was to continue last 2 cycles with Xeloda (2 weeks on 1 week off) only which he should complete next week. -Had restaging imaging on 02/03/2022 revealing no evidence of recurrent or metastatic disease. -RTC as scheduled to see Dr. Burr Medico back on 02/28/2022.    2. Chemo Side Effects: Nausea,  Vomiting, Headaches, Neuropathy G1 -Experienced increased nausea with vomiting and headaches after infusions refractory to Zofran ODT, Compazine, Phenergan and dexamethasone   3. Iron deficiency anemia, secondary to #1 -he presented to ED with acute on chronic anemia (hgb range 9-12 since 2017), with hgb 7 and ferritin 2 -he was started on oral iron, tolerating. -He received Monoferric 09/11/21, will schedule as needed in the future   4. Seizure disorder and h/o alcohol abuse -on Keppra, no recent seizure in 2022.  -he quit drinking at age 70 and continues alcohol cessation   5. Social -patient is not working, and does not drive due to seizure disorder.  -he has medicaid  -he has 3 children ages 18, 61, 43 -has strong family support with wife and mother    35. Genetics -patient's disease is MMR normal, MSI-stable. Not likely lynch syndrome. However, due to his young age, and a cousin with prostate cancer in his 69's, we strongly encouraged him to undergo genetic testing -he understands his children will need to begin screening at or before age 62, or sooner if he has genetic mutation -he did not show for genetic counseling on 12/10/21   7. Sciatic Nerve Pain -present for last 4-5 years -he has been followed by a pain management clinic -currently on oxycodone     PLAN: -Reviewed labs and recent CT scan. -Continue Xeloda- Cycle 8 6/7- 6/20 with week off 6/21-6/27.  I have asked that he hold off on restarting until he sees Dr. Burr Medico on 6/30.   -Return to clinic as scheduled on 02/28/2022 for f/u and labs.    No problem-specific Assessment & Plan notes found for this encounter.  SUMMARY OF ONCOLOGIC HISTORY: Oncology History  Cancer of right colon ([place])  07/19/2021 Imaging   CT AP IMPRESSION: Focal area of high-grade narrowing of the proximal ascending colon may be sequela of chronic inflammation and stricture versus a mass. There is mild distension of the cecum and terminal  ileum. Clinical correlation and further evaluation with lower GI study or colonoscopy recommended.   07/22/2021 Procedure   Colonoscopy A severe stenosis was found in the distal ascending colon and was non-traversed. It was ulcerated and inflamed. This could be inflammatory versus malignant stricture.   07/22/2021 Initial Biopsy   FINAL MICROSCOPIC DIAGNOSIS:  A. COLON, ASCENDING, BIOPSY:  - Invasive colorectal adenocarcinoma, moderately differentiated.    07/23/2021 Definitive Surgery   PRE-OPERATIVE DIAGNOSIS:  right colon mass POST-OPERATIVE DIAGNOSIS:  right colon mass PROCEDURE:  Procedure(s): XI ROBOT ASSISTED LAPAROSCOPIC RIGHT COLECTOMY (N/A) Dr. Rosendo Gros   07/23/2021 Pathology Results   FINAL MICROSCOPIC DIAGNOSIS:  A. COLON, RIGHT, RESECTION:  - Invasive moderately differentiated colonic adenocarcinoma, 4.5 cm.  - Carcinoma extends into pericolonic connective tissue and focally  involves serosal surface.  - Margins not involved.  - Metastatic carcinoma in nine of nineteen lymph nodes (9/19).  - Unremarkable appendix. MMR normal, MSI-stable    07/23/2021 Cancer Staging   Staging form: Colon and Rectum, AJCC 8th Edition - Pathologic stage from 07/23/2021: Stage IIIC (pT4a, pN2b, cM0) - Signed by Alla Feeling, NP on 08/15/2021 Stage prefix: Initial diagnosis Histologic grading system: 4 grade system Histologic grade (G): G2 Laterality: Right Lymph-vascular invasion (LVI): LVI present/identified, NOS Perineural invasion (PNI): Present Microsatellite instability (MSI): Stable   07/24/2021 Initial Diagnosis   Colon adenocarcinoma ([place])   07/25/2021 Tumor Marker   CEA: 2.9   07/28/2021 Imaging   CT chest IMPRESSION: 1. No evidence of metastatic disease within the chest. 2. Lungs demonstrate atelectasis, most evident lower lobes and bases of the left upper lobe lingula and right middle lobe. No convincing pneumonia and no evidence of pulmonary edema.      09/11/2021 -  Chemotherapy   Patient is on Treatment Plan : COLORECTAL Xelox (Capeox) q21d        INTERVAL HISTORY:  Joseph Haney is here for a follow up of colon cancer. He was last seen by me on 12/24/21. He presents to the clinic alone. He has been compliant with his Xeloda.  He should complete his current cycle next week.  He already has scheduled follow-up with Dr. Burr Medico at the end of the month.  Only complaint today is change in taste.  No additional nausea or vomiting.  No abdominal pain.  He is eating and drinking well.  He prefers to continue to sweet foods although he is trying to eat a variety.  Numbness and tingling in his fingertips have improved.  No new concerns.  Review of Systems  All other systems reviewed and are negative.     All other systems were reviewed with the patient and are negative.  MEDICAL HISTORY:  Past Medical History:  Diagnosis Date   Alcohol abuse    GERD (gastroesophageal reflux disease)    Seizure ([place])     SURGICAL HISTORY: Past Surgical History:  Procedure Laterality Date   BIOPSY  07/22/2021   Procedure: BIOPSY;  Surgeon: [name], MD;  Location: [facility];  Service: Gastroenterology;;   COLONOSCOPY WITH PROPOFOL N/A 07/22/2021   Procedure: COLONOSCOPY WITH PROPOFOL;  Surgeon: [name], MD;  Location: [facility];  Service: Gastroenterology;  Laterality: N/A;  NO PAST SURGERIES     SUBMUCOSAL TATTOO INJECTION  07/22/2021   Procedure: SUBMUCOSAL TATTOO INJECTION;  Surgeon: [name], MD;  Location: [facility];  Service: Gastroenterology;;    I have reviewed the social history and family history with the patient and they are unchanged from previous note.  ALLERGIES:  is allergic to morphine and related.  MEDICATIONS:  Current Outpatient Medications  Medication Sig Dispense Refill   bacitracin ointment Apply 1 application topically 2 (two) times daily. 14 g 0   capecitabine (XELODA) 500 MG tablet  Take 4 tablets (2,000 mg total) by mouth 2 (two) times daily after a meal. Take for 14 days on, 7 days off. Repeat every 21 days. 112 tablet 1   carvedilol (COREG) 25 MG tablet Take 1 tablet (25 mg total) by mouth 2 (two) times daily with a meal. 30 tablet 0   cyclobenzaprine (FLEXERIL) 10 MG tablet Take 10-20 mg by mouth 2 (two) times daily as needed for muscle spasms.     dexamethasone (DECADRON) 4 MG tablet Take 1 tablet (4 mg total) by mouth daily. Take for 3-5 days after chemo for nausea and fatigue 20 tablet 0   docusate sodium (COLACE) 100 MG capsule Take 100 mg by mouth daily. (Patient not taking: Reported on 07/20/2021)     ferrous sulfate 325 (65 FE) MG tablet Take 1 tablet (325 mg total) by mouth 2 (two) times daily with a meal. 100 tablet 3   gabapentin (NEURONTIN) 300 MG capsule Take 300 mg by mouth See admin instructions. Take 300 mg by mouth three to four times a day     methocarbamol (ROBAXIN) 500 MG tablet Take 1 tablet (500 mg total) by mouth 2 (two) times daily. 20 tablet 0   NUCYNTA 75 MG tablet Take 75 mg by mouth See admin instructions. Take 75 mg by mouth four to five times a day as needed for pain     ondansetron (ZOFRAN) 8 MG tablet Take 1 tablet (8 mg total) by mouth 2 (two) times daily as needed for refractory nausea / vomiting. Start on day 3 after chemotherapy. 30 tablet 1   ondansetron (ZOFRAN-ODT) 8 MG disintegrating tablet Take 1 tablet (8 mg total) by mouth every 8 (eight) hours as needed for nausea or vomiting. 20 tablet 0   oxyCODONE (ROXICODONE) 15 MG immediate release tablet Take 15 mg by mouth every 4 (four) hours as needed for pain.     polyethylene glycol (MIRALAX / GLYCOLAX) 17 g packet Take 17 g by mouth daily as needed for mild constipation or moderate constipation. 14 each 0   prochlorperazine (COMPAZINE) 10 MG tablet Take 1 tablet (10 mg total) by mouth every 6 (six) hours as needed (Nausea or vomiting). 30 tablet 1   promethazine (PHENERGAN) 25 MG tablet  Take 1 tablet (25 mg total) by mouth every 6 (six) hours as needed for nausea or vomiting. 30 tablet 0   No current facility-administered medications for this visit.    PHYSICAL EXAMINATION: ECOG PERFORMANCE STATUS: 1 - Symptomatic but completely ambulatory  There were no vitals filed for this visit.  Wt Readings from Last 3 Encounters:  01/15/22 174 lb (78.9 kg)  12/24/21 171 lb 1.6 oz (77.6 kg)  11/29/21 176 lb 1.6 oz (79.9 kg)    Physical Exam Constitutional:      Appearance: Normal appearance.  Cardiovascular:     Rate and Rhythm: Normal rate and regular rhythm.  Pulmonary:     Effort: Pulmonary  effort is normal.     Breath sounds: Normal breath sounds.  Abdominal:     General: Abdomen is flat. Bowel sounds are normal.     Palpations: Abdomen is soft.  Neurological:     Mental Status: He is alert.     LABORATORY DATA:  I have reviewed the data as listed    Latest Ref Rng & Units 01/15/2022   11:03 AM 12/24/2021   11:41 AM 11/29/2021    8:59 AM  CBC  WBC 4.0 - 10.5 K/uL 3.9  6.0  5.7   Hemoglobin 13.0 - 17.0 g/dL 12.6  13.0  12.6   Hematocrit 39.0 - 52.0 % 37.4  38.3  39.0   Platelets 150 - 400 K/uL 258  169  201         Latest Ref Rng & Units 01/15/2022   11:03 AM 12/24/2021   11:41 AM 11/29/2021    8:59 AM  CMP  Glucose 70 - 99 mg/dL 103  107  104   BUN 6 - 20 mg/dL _0 Creatinine 0.61 - 1.24 mg/dL 0.79  0.89  0.90   Sodium 135 - 145 mmol/L 139  139  140   Potassium 3.5 - 5.1 mmol/L 3.7  3.7  4.0   Chloride 98 - 111 mmol/L 105  105  104   CO2 22 - 32 mmol/L _1 Calcium 8.9 - 10.3 mg/dL 9.2  9.2  9.4   Total Protein 6.5 - 8.1 g/dL 8.1  8.2  8.1   Total Bilirubin 0.3 - 1.2 mg/dL 0.4  0.3  0.3   Alkaline Phos 38 - 126 U/L 92  92  80   AST 15 - 41 U/L _2 ALT 0 - 44 U/L _3 RADIOGRAPHIC STUDIES: I have personally reviewed the radiological images as listed and agreed with the findings in the report. No results  found.    No orders of the defined types were placed in this encounter.  I spent 20 minutes dedicated to the care of this patient (face-to-face and non-face-to-face) on the date of the encounter to include what is described in the assessment and plan.     Jacquelin Hawking, NP 02/10/2022

## 2022-02-11 ENCOUNTER — Other Ambulatory Visit (HOSPITAL_COMMUNITY): Payer: Self-pay

## 2022-02-13 ENCOUNTER — Other Ambulatory Visit (HOSPITAL_COMMUNITY): Payer: Self-pay

## 2022-02-17 ENCOUNTER — Other Ambulatory Visit (HOSPITAL_COMMUNITY): Payer: Self-pay

## 2022-02-28 ENCOUNTER — Inpatient Hospital Stay: Payer: Medicaid Other | Admitting: Hematology

## 2022-02-28 ENCOUNTER — Inpatient Hospital Stay: Payer: Medicaid Other

## 2022-03-11 ENCOUNTER — Other Ambulatory Visit: Payer: Self-pay | Admitting: Hematology

## 2022-03-11 ENCOUNTER — Other Ambulatory Visit (HOSPITAL_COMMUNITY): Payer: Self-pay

## 2022-03-11 DIAGNOSIS — C189 Malignant neoplasm of colon, unspecified: Secondary | ICD-10-CM

## 2022-03-11 NOTE — Telephone Encounter (Signed)
Per Dr. Burr Medico, no refill for now since pt did not come to is appointment on 02/28/2022.  Pt is scheduled for f/u appt on 03/21/2022 to see Dr. Burr Medico and she will re-address at that time.

## 2022-03-12 ENCOUNTER — Other Ambulatory Visit (HOSPITAL_COMMUNITY): Payer: Self-pay

## 2022-03-21 ENCOUNTER — Inpatient Hospital Stay: Payer: Medicaid Other

## 2022-03-21 ENCOUNTER — Inpatient Hospital Stay: Payer: Medicaid Other | Attending: Nurse Practitioner | Admitting: Hematology

## 2022-03-21 ENCOUNTER — Encounter: Payer: Self-pay | Admitting: Hematology

## 2022-03-21 ENCOUNTER — Other Ambulatory Visit: Payer: Self-pay

## 2022-03-21 VITALS — BP 138/98 | HR 114 | Temp 98.5°F | Resp 18 | Ht 69.0 in | Wt 179.1 lb

## 2022-03-21 DIAGNOSIS — C779 Secondary and unspecified malignant neoplasm of lymph node, unspecified: Secondary | ICD-10-CM | POA: Diagnosis not present

## 2022-03-21 DIAGNOSIS — C182 Malignant neoplasm of ascending colon: Secondary | ICD-10-CM | POA: Insufficient documentation

## 2022-03-21 DIAGNOSIS — D509 Iron deficiency anemia, unspecified: Secondary | ICD-10-CM | POA: Diagnosis not present

## 2022-03-21 DIAGNOSIS — G40909 Epilepsy, unspecified, not intractable, without status epilepticus: Secondary | ICD-10-CM | POA: Diagnosis not present

## 2022-03-21 DIAGNOSIS — M5430 Sciatica, unspecified side: Secondary | ICD-10-CM | POA: Insufficient documentation

## 2022-03-21 DIAGNOSIS — C189 Malignant neoplasm of colon, unspecified: Secondary | ICD-10-CM

## 2022-03-21 LAB — CMP (CANCER CENTER ONLY)
ALT: 16 U/L (ref 0–44)
AST: 16 U/L (ref 15–41)
Albumin: 4.5 g/dL (ref 3.5–5.0)
Alkaline Phosphatase: 87 U/L (ref 38–126)
Anion gap: 11 (ref 5–15)
BUN: 12 mg/dL (ref 6–20)
CO2: 24 mmol/L (ref 22–32)
Calcium: 9.6 mg/dL (ref 8.9–10.3)
Chloride: 106 mmol/L (ref 98–111)
Creatinine: 0.93 mg/dL (ref 0.61–1.24)
GFR, Estimated: >60 mL/min (ref >60)
Glucose, Bld: 101 mg/dL — ABNORMAL HIGH (ref 70–99)
Potassium: 3.3 mmol/L — ABNORMAL LOW (ref 3.5–5.1)
Sodium: 141 mmol/L (ref 135–145)
Total Bilirubin: 0.5 mg/dL (ref 0.3–1.2)
Total Protein: 8.8 g/dL — ABNORMAL HIGH (ref 6.5–8.1)

## 2022-03-21 LAB — CBC WITH DIFFERENTIAL (CANCER CENTER ONLY)
Abs Immature Granulocytes: 0.06 10*3/uL (ref 0.00–0.07)
Basophils Absolute: 0 10*3/uL (ref 0.0–0.1)
Basophils Relative: 1 %
Eosinophils Absolute: 0 10*3/uL (ref 0.0–0.5)
Eosinophils Relative: 1 %
HCT: 39.8 % (ref 39.0–52.0)
Hemoglobin: 13.8 g/dL (ref 13.0–17.0)
Immature Granulocytes: 1 %
Lymphocytes Relative: 33 %
Lymphs Abs: 2.3 10*3/uL (ref 0.7–4.0)
MCH: 31.6 pg (ref 26.0–34.0)
MCHC: 34.7 g/dL (ref 30.0–36.0)
MCV: 91.1 fL (ref 80.0–100.0)
Monocytes Absolute: 0.4 10*3/uL (ref 0.1–1.0)
Monocytes Relative: 5 %
Neutro Abs: 4.3 10*3/uL (ref 1.7–7.7)
Neutrophils Relative %: 59 %
Platelet Count: 286 10*3/uL (ref 150–400)
RBC: 4.37 MIL/uL (ref 4.22–5.81)
RDW: 13.1 % (ref 11.5–15.5)
WBC Count: 7.1 10*3/uL (ref 4.0–10.5)
nRBC: 0 % (ref 0.0–0.2)

## 2022-03-21 LAB — CEA (IN HOUSE-CHCC): CEA (CHCC-In House): 1.43 ng/mL (ref 0.00–5.00)

## 2022-03-21 MED ORDER — POTASSIUM CHLORIDE CRYS ER 20 MEQ PO TBCR: 20.0000 meq | EXTENDED_RELEASE_TABLET | Freq: Every day | ORAL | 0 refills | Status: DC

## 2022-03-21 NOTE — Progress Notes (Signed)
[place]   Telephone:(336) [phone] Fax:(336) [phone]   Clinic Follow up Note   Patient Care Team: [facility] as PCP - General (Family Medicine) Ralene Ok, MD as Consulting Physician (General Surgery) Alla Feeling, NP as Nurse Practitioner (Nurse Practitioner) Truitt Merle, MD as Consulting Physician (Hematology) Mohammed Kindle, MD as Referring Physician (Pain Medicine)  Date of Service:  03/21/2022  CHIEF COMPLAINT: f/u of colon cancer  CURRENT THERAPY:  Adjuvant CAPOX q3 weeks x6 months, starting 09/11/21  ASSESSMENT & PLAN:  Joseph Haney is a 46 y.o. male with   1. Moderately differentiated adenocarcinoma of ascending colon, pT4apN2bM0, stage IIIC, MMR normal, MSI-stable -he presented with N/V and abdominal pain, and IDA. Work up showed adenocarcinoma in the ascending colon, negative for metastatic disease. Baseline CEA is normal.  -s/p partial colectomy by Dr. Rosendo Gros 07/23/21, final path showed: pT4a lesion with LVI and PNI, no perforation; clear margins; 9/19 positive LNs, pN2b -He received Capox 09/11/21 - 01/15/22, oxali discontinued due to significant nausea/vomiting and headaches.  -restaging CT CAP 02/03/22 showed NED. -he completed Xeloda at end of 01/2022. He is recovering well off treatment. Labs reviewed, WNL except potassium 3.3. I will call in 5 days of KCL and advised him to increase potassium intake, such as bananas. --I discussed the risk of cancer recurrence in the future. I discussed the surveillance plan, which is a physical exam and lab test (including CBC, CMP and CEA) every 3 months for the first 2 years, then every 6-12 months, colonoscopy in one year, and surveilliance CT scan every 6-12 month for up to 5 year.  -plan for post-treatment CT in 4 months   3. Iron deficiency anemia, secondary to #1 -he presented to ED with acute on chronic anemia (hgb range 9-12 since 2017), with hgb 7 and ferritin 2 -he was  started on oral iron, tolerating. -He required one dose Monoferric 09/11/21. -he has not required additional intervention. Anemia resolved off oxali.   4. Seizure disorder and h/o alcohol abuse -on Keppra, no recent seizure in 2022.  -he quit drinking at age 48 and continues alcohol cessation   5. Social -patient is not working, and does not drive due to seizure disorder. He has medicaid  -he has 3 children ages 54, 51, 33 -has strong family support with wife and mother    61. Genetics -patient's disease is MMR normal, MSI-stable. Not likely lynch syndrome. However, due to his young age, and a cousin with prostate cancer in his 21's, we strongly encouraged him to undergo genetic testing -he understands his children will need to begin screening at or before age 19, or sooner if he has genetic mutation -he did not show for genetic counseling on 12/10/21   7. Sciatic Nerve Pain -present for last 4-5 years -he has been followed by a pain management clinic -currently on oxycodone     PLAN: -f/u in 4 months with lab and CT several days before   No problem-specific Assessment & Plan notes found for this encounter.   SUMMARY OF ONCOLOGIC HISTORY: Oncology History  Cancer of right colon ([place])  07/19/2021 Imaging   CT AP IMPRESSION: Focal area of high-grade narrowing of the proximal ascending colon may be sequela of chronic inflammation and stricture versus a mass. There is mild distension of the cecum and terminal ileum. Clinical correlation and further evaluation with lower GI study or colonoscopy recommended.   07/22/2021 Procedure   Colonoscopy A severe stenosis was  found in the distal ascending colon and was non-traversed. It was ulcerated and inflamed. This could be inflammatory versus malignant stricture.   07/22/2021 Initial Biopsy   FINAL MICROSCOPIC DIAGNOSIS:  A. COLON, ASCENDING, BIOPSY:  - Invasive colorectal adenocarcinoma, moderately differentiated.    07/23/2021  Definitive Surgery   PRE-OPERATIVE DIAGNOSIS:  right colon mass POST-OPERATIVE DIAGNOSIS:  right colon mass PROCEDURE:  Procedure(s): XI ROBOT ASSISTED LAPAROSCOPIC RIGHT COLECTOMY (N/A) Dr. Rosendo Gros   07/23/2021 Pathology Results   FINAL MICROSCOPIC DIAGNOSIS:  A. COLON, RIGHT, RESECTION:  - Invasive moderately differentiated colonic adenocarcinoma, 4.5 cm.  - Carcinoma extends into pericolonic connective tissue and focally  involves serosal surface.  - Margins not involved.  - Metastatic carcinoma in nine of nineteen lymph nodes (9/19).  - Unremarkable appendix. MMR normal, MSI-stable    07/23/2021 Cancer Staging   Staging form: Colon and Rectum, AJCC 8th Edition - Pathologic stage from 07/23/2021: Stage IIIC (pT4a, pN2b, cM0) - Signed by Alla Feeling, NP on 08/15/2021 Stage prefix: Initial diagnosis Histologic grading system: 4 grade system Histologic grade (G): G2 Laterality: Right Lymph-vascular invasion (LVI): LVI present/identified, NOS Perineural invasion (PNI): Present Microsatellite instability (MSI): Stable   07/24/2021 Initial Diagnosis   Colon adenocarcinoma ([place])   07/25/2021 Tumor Marker   CEA: 2.9   07/28/2021 Imaging   CT chest IMPRESSION: 1. No evidence of metastatic disease within the chest. 2. Lungs demonstrate atelectasis, most evident lower lobes and bases of the left upper lobe lingula and right middle lobe. No convincing pneumonia and no evidence of pulmonary edema.     09/11/2021 -  Chemotherapy   Patient is on Treatment Plan : COLORECTAL Xelox (Capeox) q21d        INTERVAL HISTORY:  Joseph Haney is here for a follow up of colon cancer. He was last seen by NP Anderson Malta on 02/10/22. He presents to the clinic alone. He reports he is recovering well from chemo, no further nausea. He notes his energy is still recovering.   All other systems were reviewed with the patient and are negative.  MEDICAL HISTORY:  Past Medical History:   Diagnosis Date   Alcohol abuse    GERD (gastroesophageal reflux disease)    Seizure ([place])     SURGICAL HISTORY: Past Surgical History:  Procedure Laterality Date   BIOPSY  07/22/2021   Procedure: BIOPSY;  Surgeon: [name], MD;  Location: [facility];  Service: Gastroenterology;;   COLONOSCOPY WITH PROPOFOL N/A 07/22/2021   Procedure: COLONOSCOPY WITH PROPOFOL;  Surgeon: [name], MD;  Location: [facility];  Service: Gastroenterology;  Laterality: N/A;   NO PAST SURGERIES     SUBMUCOSAL TATTOO INJECTION  07/22/2021   Procedure: SUBMUCOSAL TATTOO INJECTION;  Surgeon: [name], MD;  Location: [facility];  Service: Gastroenterology;;    I have reviewed the social history and family history with the patient and they are unchanged from previous note.  ALLERGIES:  is allergic to morphine and related.  MEDICATIONS:  Current Outpatient Medications  Medication Sig Dispense Refill   potassium chloride SA (KLOR-CON M) 20 MEQ tablet Take 1 tablet (20 mEq total) by mouth daily. 5 tablet 0   bacitracin ointment Apply 1 application topically 2 (two) times daily. 14 g 0   carvedilol (COREG) 25 MG tablet Take 1 tablet (25 mg total) by mouth 2 (two) times daily with a meal. 30 tablet 0   cyclobenzaprine (FLEXERIL) 10 MG tablet Take 10-20 mg by mouth 2 (two) times daily as needed for muscle  spasms.     dexamethasone (DECADRON) 4 MG tablet Take 1 tablet (4 mg total) by mouth daily. Take for 3-5 days after chemo for nausea and fatigue 20 tablet 0   docusate sodium (COLACE) 100 MG capsule Take 100 mg by mouth daily. (Patient not taking: Reported on 07/20/2021)     ferrous sulfate 325 (65 FE) MG tablet Take 1 tablet (325 mg total) by mouth 2 (two) times daily with a meal. 100 tablet 3   gabapentin (NEURONTIN) 300 MG capsule Take 300 mg by mouth See admin instructions. Take 300 mg by mouth three to four times a day     methocarbamol (ROBAXIN) 500 MG tablet Take 1 tablet  (500 mg total) by mouth 2 (two) times daily. 20 tablet 0   NUCYNTA 75 MG tablet Take 75 mg by mouth See admin instructions. Take 75 mg by mouth four to five times a day as needed for pain     ondansetron (ZOFRAN) 8 MG tablet Take 1 tablet (8 mg total) by mouth 2 (two) times daily as needed for refractory nausea / vomiting. Start on day 3 after chemotherapy. 30 tablet 1   ondansetron (ZOFRAN-ODT) 8 MG disintegrating tablet Take 1 tablet (8 mg total) by mouth every 8 (eight) hours as needed for nausea or vomiting. 20 tablet 0   oxyCODONE (ROXICODONE) 15 MG immediate release tablet Take 15 mg by mouth every 4 (four) hours as needed for pain.     polyethylene glycol (MIRALAX / GLYCOLAX) 17 g packet Take 17 g by mouth daily as needed for mild constipation or moderate constipation. 14 each 0   prochlorperazine (COMPAZINE) 10 MG tablet Take 1 tablet (10 mg total) by mouth every 6 (six) hours as needed (Nausea or vomiting). 30 tablet 1   promethazine (PHENERGAN) 25 MG tablet Take 1 tablet (25 mg total) by mouth every 6 (six) hours as needed for nausea or vomiting. 30 tablet 0   No current facility-administered medications for this visit.    PHYSICAL EXAMINATION: ECOG PERFORMANCE STATUS: 1 - Symptomatic but completely ambulatory  Vitals:   03/21/22 1446  BP: (!) 138/98  Pulse: (!) 114  Resp: 18  Temp: 98.5 F (36.9 C)  SpO2: 100%   Wt Readings from Last 3 Encounters:  03/21/22 179 lb 1.6 oz (81.2 kg)  02/10/22 178 lb 1.6 oz (80.8 kg)  01/15/22 174 lb (78.9 kg)     GENERAL:alert, no distress and comfortable SKIN: skin color normal, no rashes or significant lesions EYES: normal, Conjunctiva are pink and non-injected, sclera clear  NEURO: alert & oriented x 3 with fluent speech  LABORATORY DATA:  I have reviewed the data as listed    Latest Ref Rng & Units 03/21/2022    1:52 PM 02/10/2022    2:33 PM 01/15/2022   11:03 AM  CBC  WBC 4.0 - 10.5 K/uL 7.1  5.5  3.9   Hemoglobin 13.0 - 17.0  g/dL 13.8  13.4  12.6   Hematocrit 39.0 - 52.0 % 39.8  38.9  37.4   Platelets 150 - 400 K/uL 286  172  258         Latest Ref Rng & Units 03/21/2022    1:52 PM 02/10/2022    2:33 PM 01/15/2022   11:03 AM  CMP  Glucose 70 - 99 mg/dL 101  108  103   BUN 6 - 20 mg/dL 12  11  11    Creatinine 0.61 - 1.24 mg/dL 0.93  0.84  0.79   Sodium 135 - 145 mmol/L 141  140  139   Potassium 3.5 - 5.1 mmol/L 3.3  3.9  3.7   Chloride 98 - 111 mmol/L 106  105  105   CO2 22 - 32 mmol/L 24  27  27    Calcium 8.9 - 10.3 mg/dL 9.6  9.9  9.2   Total Protein 6.5 - 8.1 g/dL 8.8  8.4  8.1   Total Bilirubin 0.3 - 1.2 mg/dL 0.5  0.4  0.4   Alkaline Phos 38 - 126 U/L 87  81  92   AST 15 - 41 U/L 16  27  23    ALT 0 - 44 U/L 16  20  17        RADIOGRAPHIC STUDIES: I have personally reviewed the radiological images as listed and agreed with the findings in the report. No results found.    Orders Placed This Encounter  Procedures   CT CHEST ABDOMEN PELVIS W CONTRAST    Standing Status:   Future    Standing Expiration Date:   03/22/2023    Order Specific Question:   Preferred imaging location?    Answer:   [facility]    Order Specific Question:   Release to patient    Answer:   Immediate    Order Specific Question:   Is Oral Contrast requested for this exam?    Answer:   Yes, Per Radiology protocol   All questions were answered. The patient knows to call the clinic with any problems, questions or concerns. No barriers to learning was detected. The total time spent in the appointment was 30 minutes.     Truitt Merle, MD 03/21/2022   I, Wilburn Mylar, am acting as scribe for Truitt Merle, MD.   I have reviewed the above documentation for accuracy and completeness, and I agree with the above.

## 2022-03-24 ENCOUNTER — Other Ambulatory Visit: Payer: Self-pay

## 2022-03-25 ENCOUNTER — Telehealth: Payer: Self-pay | Admitting: Hematology

## 2022-03-25 NOTE — Telephone Encounter (Signed)
Scheduled follow-up appointments per 7/21 los. Patient is aware.

## 2022-03-26 ENCOUNTER — Other Ambulatory Visit: Payer: Self-pay

## 2022-04-01 ENCOUNTER — Other Ambulatory Visit: Payer: Self-pay

## 2022-04-03 ENCOUNTER — Encounter (HOSPITAL_COMMUNITY): Payer: Self-pay

## 2022-04-03 ENCOUNTER — Emergency Department (HOSPITAL_COMMUNITY): Payer: Medicaid Other

## 2022-04-03 ENCOUNTER — Emergency Department (HOSPITAL_COMMUNITY)
Admission: EM | Admit: 2022-04-03 | Discharge: 2022-04-03 | Disposition: A | Discharge status: Home / Self Care | Payer: Medicaid Other | Attending: Emergency Medicine | Admitting: Emergency Medicine

## 2022-04-03 DIAGNOSIS — R079 Chest pain, unspecified: Secondary | ICD-10-CM | POA: Insufficient documentation

## 2022-04-03 DIAGNOSIS — R109 Unspecified abdominal pain: Secondary | ICD-10-CM | POA: Diagnosis not present

## 2022-04-03 DIAGNOSIS — R1084 Generalized abdominal pain: Secondary | ICD-10-CM | POA: Diagnosis not present

## 2022-04-03 DIAGNOSIS — R0789 Other chest pain: Secondary | ICD-10-CM | POA: Diagnosis not present

## 2022-04-03 DIAGNOSIS — Z85038 Personal history of other malignant neoplasm of large intestine: Secondary | ICD-10-CM | POA: Insufficient documentation

## 2022-04-03 LAB — URINALYSIS, ROUTINE W REFLEX MICROSCOPIC
Bacteria, UA: NONE SEEN
Bilirubin Urine: NEGATIVE
Glucose, UA: 50 mg/dL — AB
Hgb urine dipstick: NEGATIVE
Ketones, ur: NEGATIVE mg/dL
Leukocytes,Ua: NEGATIVE
Nitrite: NEGATIVE
Protein, ur: 30 mg/dL — AB
Specific Gravity, Urine: >1.046 — ABNORMAL HIGH (ref 1.005–1.030)
pH: 7 (ref 5.0–8.0)

## 2022-04-03 LAB — CBC WITH DIFFERENTIAL/PLATELET
Abs Immature Granulocytes: 0.07 10*3/uL (ref 0.00–0.07)
Basophils Absolute: 0 10*3/uL (ref 0.0–0.1)
Basophils Relative: 0 %
Eosinophils Absolute: 0 10*3/uL (ref 0.0–0.5)
Eosinophils Relative: 0 %
HCT: 40.4 % (ref 39.0–52.0)
Hemoglobin: 13.2 g/dL (ref 13.0–17.0)
Immature Granulocytes: 1 %
Lymphocytes Relative: 8 %
Lymphs Abs: 0.9 10*3/uL (ref 0.7–4.0)
MCH: 31.8 pg (ref 26.0–34.0)
MCHC: 32.7 g/dL (ref 30.0–36.0)
MCV: 97.3 fL (ref 80.0–100.0)
Monocytes Absolute: 0.3 10*3/uL (ref 0.1–1.0)
Monocytes Relative: 3 %
Neutro Abs: 10.4 10*3/uL — ABNORMAL HIGH (ref 1.7–7.7)
Neutrophils Relative %: 88 %
Platelets: 199 10*3/uL (ref 150–400)
RBC: 4.15 MIL/uL — ABNORMAL LOW (ref 4.22–5.81)
RDW: 13.1 % (ref 11.5–15.5)
WBC: 11.8 10*3/uL — ABNORMAL HIGH (ref 4.0–10.5)
nRBC: 0 % (ref 0.0–0.2)

## 2022-04-03 LAB — COMPREHENSIVE METABOLIC PANEL
ALT: 155 U/L — ABNORMAL HIGH (ref 0–44)
AST: 319 U/L — ABNORMAL HIGH (ref 15–41)
Albumin: 4 g/dL (ref 3.5–5.0)
Alkaline Phosphatase: 109 U/L (ref 38–126)
Anion gap: 10 (ref 5–15)
BUN: 12 mg/dL (ref 6–20)
CO2: 20 mmol/L — ABNORMAL LOW (ref 22–32)
Calcium: 8.7 mg/dL — ABNORMAL LOW (ref 8.9–10.3)
Chloride: 110 mmol/L (ref 98–111)
Creatinine, Ser: 0.9 mg/dL (ref 0.61–1.24)
GFR, Estimated: >60 mL/min (ref >60)
Glucose, Bld: 180 mg/dL — ABNORMAL HIGH (ref 70–99)
Potassium: 3.8 mmol/L (ref 3.5–5.1)
Sodium: 140 mmol/L (ref 135–145)
Total Bilirubin: 1.2 mg/dL (ref 0.3–1.2)
Total Protein: 8 g/dL (ref 6.5–8.1)

## 2022-04-03 LAB — RAPID URINE DRUG SCREEN, HOSP PERFORMED
Amphetamines: NOT DETECTED
Barbiturates: NOT DETECTED
Benzodiazepines: NOT DETECTED
Cocaine: NOT DETECTED
Opiates: POSITIVE — AB
Tetrahydrocannabinol: NOT DETECTED

## 2022-04-03 LAB — LIPASE, BLOOD: Lipase: 25 U/L (ref 11–51)

## 2022-04-03 LAB — ETHANOL: Alcohol, Ethyl (B): <10 mg/dL (ref <10)

## 2022-04-03 LAB — TROPONIN I (HIGH SENSITIVITY)
Troponin I (High Sensitivity): 2 ng/L (ref <18)
Troponin I (High Sensitivity): 2 ng/L (ref <18)

## 2022-04-03 MED ORDER — OXYCODONE HCL 5 MG PO TABS: 5.0000 mg | ORAL_TABLET | Freq: Four times a day (QID) | ORAL | 0 refills | Status: DC | PRN

## 2022-04-03 MED ORDER — ONDANSETRON HCL 4 MG/2ML IJ SOLN
4.0000 mg | Freq: Once | INTRAMUSCULAR | Status: AC
  Administered 2022-04-03: 4 mg via INTRAVENOUS
  Filled 2022-04-03: qty 2

## 2022-04-03 MED ORDER — SODIUM CHLORIDE (PF) 0.9 % IJ SOLN
INTRAMUSCULAR | Status: AC
  Filled 2022-04-03: qty 50

## 2022-04-03 MED ORDER — HYDROMORPHONE HCL 1 MG/ML IJ SOLN
1.0000 mg | Freq: Once | INTRAMUSCULAR | Status: AC
  Administered 2022-04-03: 1 mg via INTRAVENOUS
  Filled 2022-04-03: qty 1

## 2022-04-03 MED ORDER — IOHEXOL 350 MG/ML SOLN
100.0000 mL | Freq: Once | INTRAVENOUS | Status: AC | PRN
  Administered 2022-04-03: 100 mL via INTRAVENOUS

## 2022-04-03 MED ORDER — ONDANSETRON HCL 4 MG PO TABS: 4.0000 mg | ORAL_TABLET | Freq: Three times a day (TID) | ORAL | 0 refills | Status: DC | PRN

## 2022-04-03 NOTE — ED Provider Notes (Signed)
[facility] DEPT Provider Note   CSN: [id number] Arrival date & time: 04/03/22  0451     History  Chief Complaint  Patient presents with   Chest Pain   Abdominal Pain    Joseph Haney is a 46 y.o. male.  The history is provided by the patient and medical records.  Chest Pain Associated symptoms: abdominal pain   Abdominal Pain Associated symptoms: chest pain    46 y.o. M with hx of colon CA (not undergoing active chemo), Fe+ deficiency, alcohol abuse, GERD, presenting to the ED for chest and abdominal pain that began this morning.  He reports some nausea/vomiting but denies diarrhea.  No cough, fever, chills.  No sick contacts.  He does report feeling SOB.  Denies any hx of DVT or PE.  No known cardiac hx.  He is not a smoker.  No meds taken PTA.  Home Medications Prior to Admission medications   Medication Sig Start Date End Date Taking? Authorizing Provider  bacitracin ointment Apply 1 application topically 2 (two) times daily. 08/05/21   Varney Biles, MD  carvedilol (COREG) 25 MG tablet Take 1 tablet (25 mg total) by mouth 2 (two) times daily with a meal. 01/15/22   Truitt Merle, MD  cyclobenzaprine (FLEXERIL) 10 MG tablet Take 10-20 mg by mouth 2 (two) times daily as needed for muscle spasms. 06/25/21   [provider]  dexamethasone (DECADRON) 4 MG tablet Take 1 tablet (4 mg total) by mouth daily. Take for 3-5 days after chemo for nausea and fatigue 11/29/21   Truitt Merle, MD  docusate sodium (COLACE) 100 MG capsule Take 100 mg by mouth daily. Patient not taking: Reported on 07/20/2021    [provider]  ferrous sulfate 325 (65 FE) MG tablet Take 1 tablet (325 mg total) by mouth 2 (two) times daily with a meal. 08/01/21   [name], [name], MD  gabapentin (NEURONTIN) 300 MG capsule Take 300 mg by mouth See admin instructions. Take 300 mg by mouth three to four times a day 06/25/21   [provider]  methocarbamol (ROBAXIN)  500 MG tablet Take 1 tablet (500 mg total) by mouth 2 (two) times daily. 10/14/15   Domenic Moras, [name]  NUCYNTA 75 MG tablet Take 75 mg by mouth See admin instructions. Take 75 mg by mouth four to five times a day as needed for pain 06/28/21   [provider]  ondansetron (ZOFRAN) 8 MG tablet Take 1 tablet (8 mg total) by mouth 2 (two) times daily as needed for refractory nausea / vomiting. Start on day 3 after chemotherapy. 10/23/21   Truitt Merle, MD  ondansetron (ZOFRAN-ODT) 8 MG disintegrating tablet Take 1 tablet (8 mg total) by mouth every 8 (eight) hours as needed for nausea or vomiting. 11/29/21   Truitt Merle, MD  oxyCODONE (ROXICODONE) 15 MG immediate release tablet Take 15 mg by mouth every 4 (four) hours as needed for pain.    [provider]  polyethylene glycol (MIRALAX / GLYCOLAX) 17 g packet Take 17 g by mouth daily as needed for mild constipation or moderate constipation. 08/01/21   [name], [name], MD  potassium chloride SA (KLOR-CON M) 20 MEQ tablet Take 1 tablet (20 mEq total) by mouth daily. 03/21/22   Truitt Merle, MD  prochlorperazine (COMPAZINE) 10 MG tablet Take 1 tablet (10 mg total) by mouth every 6 (six) hours as needed (Nausea or vomiting). 11/29/21   Truitt Merle, MD  promethazine (PHENERGAN) 25 MG tablet  Take 1 tablet (25 mg total) by mouth every 6 (six) hours as needed for nausea or vomiting. 11/29/21   Truitt Merle, MD      Allergies    Morphine and related    Review of Systems   Review of Systems  Cardiovascular:  Positive for chest pain.  Gastrointestinal:  Positive for abdominal pain.  All other systems reviewed and are negative.   Physical Exam Updated Vital Signs BP (!) 148/98 (BP Location: Left Arm)   Pulse 98   Temp (!) 97.3 F (36.3 C) (Oral)   Resp 20   SpO2 100%   Physical Exam Vitals and nursing note reviewed.  Constitutional:      Appearance: He is well-developed.  HENT:     Head: Normocephalic and atraumatic.  Eyes:     Conjunctiva/sclera:  Conjunctivae normal.     Pupils: Pupils are equal, round, and reactive to light.  Cardiovascular:     Rate and Rhythm: Normal rate and regular rhythm.     Heart sounds: Normal heart sounds.  Pulmonary:     Effort: Pulmonary effort is normal.     Breath sounds: Normal breath sounds. No wheezing or rhonchi.     Comments: Lungs CTAB, sats 100% on RA during exam Abdominal:     General: Bowel sounds are normal.     Palpations: Abdomen is soft.     Tenderness: There is no abdominal tenderness.     Comments: Soft, no real elicited tenderness, normal bowel sounds  Musculoskeletal:        General: Normal range of motion.     Cervical back: Normal range of motion.  Skin:    General: Skin is warm and dry.  Neurological:     Mental Status: He is alert and oriented to person, place, and time.     ED Results / Procedures / Treatments   Labs (all labs ordered are listed, but only abnormal results are displayed) Labs Reviewed  CBC WITH DIFFERENTIAL/PLATELET - Abnormal; Notable for the following components:      Result Value   WBC 11.8 (*)    RBC 4.15 (*)    Neutro Abs 10.4 (*)    All other components within normal limits  COMPREHENSIVE METABOLIC PANEL - Abnormal; Notable for the following components:   CO2 20 (*)    Glucose, Bld 180 (*)    Calcium 8.7 (*)    AST 319 (*)    ALT 155 (*)    All other components within normal limits  LIPASE, BLOOD  URINALYSIS, ROUTINE W REFLEX MICROSCOPIC  TROPONIN I (HIGH SENSITIVITY)  TROPONIN I (HIGH SENSITIVITY)    EKG None  Radiology No results found.  Procedures Procedures    Medications Ordered in ED Medications  HYDROmorphone (DILAUDID) injection 1 mg (1 mg Intravenous Given 04/03/22 0559)  ondansetron (ZOFRAN) injection 4 mg (4 mg Intravenous Given 04/03/22 0557)    ED Course/ Medical Decision Making/ A&P                           Medical Decision Making Amount and/or Complexity of Data Reviewed Labs: ordered. Radiology:  ordered. ECG/medicine tests: ordered and independent interpretation performed.  Risk Prescription drug management.   46 year old male presenting to the ED with chest and abdominal pain that began yesterday morning.  He reports some nausea and vomiting.  No diarrhea.  No cough, fever, or chills.  He is afebrile, nontoxic.  He reports generalized chest  and back pain, also abdominal pain.  No real elicited tenderness on exam.  He does have history of colon cancer, currently under surveillance but not receiving active treatment.  Given his history, consider recurrent cancer diagnosis, PE.  He has no history of DVT or PE and is not tachycardic or hypoxic.  EKG is sinus rhythm without acute ischemic changes.  Will check labs including troponin, CTA chest to r/o PE as well as CT of abdomen and pelvis with contrast.  6:41 AM Labs with mild leukocytosis at 11.8.  Does have elevated LFT's in 2:1 ratio 319 and 155 (was normal 2 week ago at oncology check).  He does have history of alcohol abuse. Will add on ethanol but does not seem clinically intoxicated on exam.  Normal alk phos, normal bili.  Normal lipase.  Trop negative.  CT's pending at this time.  Care signed out to oncoming provider at this time.  Final Clinical Impression(s) / ED Diagnoses Final diagnoses:  Chest pain in adult  Abdominal pain, generalized    Rx / DC Orders ED Discharge Orders     None         Larene Pickett, [name] 04/03/22 3912    Maudie Flakes, MD 04/03/22 [phone]

## 2022-04-03 NOTE — ED Notes (Signed)
Pt in CT.

## 2022-04-03 NOTE — ED Triage Notes (Signed)
Patient arrived with complaints of central chest pain, generalized abdominal pain and back pain since yesterday morning. Reports NV.

## 2022-04-03 NOTE — Discharge Instructions (Addendum)
You were seen today for abdominal pain.  Your CT scan was reassuring for no acute cause of the pain that would be life-threatening at this time.  I recommend follow-up with your primary care for discussions about pain management and recommend following up with your oncologist to discuss your recent episode of pain.  I also recommend following up with GI to discuss elevated liver enzymes.  You should discuss your CT results with your primary care and oncologist as it is recommended that you have a repeat CT scan for surveillance of a lung nodule in 3 to 6 months.  Return to the hospital if you develop any life-threatening conditions

## 2022-04-03 NOTE — ED Provider Notes (Signed)
Patient care taken over at shift handoff from Joseph Carnes, [name]  In short, 46 year old male with history of colon cancer (not undergoing active chemo), iron deficiency, alcohol abuse, GERD, who presents for chest and abdominal pain that began early in the morning.  Patient reported some nausea and vomiting but denied diarrhea.  Denied cough, fever, chills, sick contacts. Physical Exam  BP (!) 145/93   Pulse (!) 107   Temp 97.9 F (36.6 C) (Oral)   Resp 20   SpO2 94%   Physical Exam  Procedures  Procedures  ED Course / MDM    Medical Decision Making Amount and/or Complexity of Data Reviewed Labs: ordered. Radiology: ordered. ECG/medicine tests: ordered.  Risk Prescription drug management.   Interpreted and reviewed CT angio chest PE study and CT abdomen pelvis. 1. No evidence for acute pulmonary embolus. 2. Persistent areas of scarring with volume loss involving the right middle lobe, right lower lobe, lingula and left lower lobe. 3. 4 mm nodule within the right upper lobe. New since 07/28/2021. On 02/03/2022 this nodule was present measuring 3 mm. The appearance of this nodule is nonspecific and may be postinflammatory or infectious in etiology. Early metastatic disease would be difficult to exclude with a high degree of certainty. Consider short-term interval follow-up with repeat CT of the chest in 3 to 6 months to assess for interval change in the size of this nodule. 4. Coronary artery calcifications. 5. Status post right hemicolectomy. No evidence for metastatic disease within the abdomen or pelvis. 6. Aortic Atherosclerosis  I agree with the radiologist findings  The patient has elevated LFTs today.  I discussed the case with Dr. Benson Norway who stated that these could likely be followed up as an outpatient.  The patient has no evidence of hepatic or gallbladder issues on CT scan.  He also has no significant right upper quadrant pain  The patient was feeling much better  after IV Dilaudid.  I was discussing possible discharge versus possible admission for continued evaluation and pain medication.  The patient was comfortable with the idea of discharge.  Further discussion revealed that the patient has been out of his chronic pain medication for at least 3 weeks or more and that he will not be seeing primary care for a refill of his chronic pain medication for another 3 weeks.  I explained that I cannot provide pain medication for 3 weeks but that I could give him a prescription to get him through the next few days until he could call to try to follow-up with primary care.  The CT shows no significant acute findings.  It did show a 4 mm nodule in the right upper lobe which had increased since June 5 by 1 mm in size.  Past on recommendations the patient of repeat CT in 3 to 6 months to assess for interval change.  Discharge home with pain medication and nausea medication.  Follow-up precautions provided       Joseph Haney 04/03/22 [place], [place], DO 04/06/22 [phone]

## 2022-04-07 ENCOUNTER — Telehealth: Payer: Self-pay

## 2022-04-07 NOTE — Telephone Encounter (Signed)
Pt's significant other called stating that pt was seen in the [facility] ED on 04/02/2022.  Pt had a Chest CT done per significant other that showed 2 spots on his lungs.  Pt and pt's significant other would like to speak with Dr. Burr Medico regarding pt's recent ED visit.  Notified Dr. Burr Medico of the pt and significant other's telephone call.

## 2022-04-16 DIAGNOSIS — R7989 Other specified abnormal findings of blood chemistry: Secondary | ICD-10-CM | POA: Diagnosis not present

## 2022-04-16 DIAGNOSIS — M545 Low back pain, unspecified: Secondary | ICD-10-CM | POA: Diagnosis not present

## 2022-04-16 DIAGNOSIS — C189 Malignant neoplasm of colon, unspecified: Secondary | ICD-10-CM | POA: Diagnosis not present

## 2022-04-16 DIAGNOSIS — I10 Essential (primary) hypertension: Secondary | ICD-10-CM | POA: Diagnosis not present

## 2022-04-16 DIAGNOSIS — G8929 Other chronic pain: Secondary | ICD-10-CM | POA: Diagnosis not present

## 2022-04-16 DIAGNOSIS — R21 Rash and other nonspecific skin eruption: Secondary | ICD-10-CM | POA: Diagnosis not present

## 2022-04-16 DIAGNOSIS — F419 Anxiety disorder, unspecified: Secondary | ICD-10-CM | POA: Diagnosis not present

## 2022-04-22 ENCOUNTER — Other Ambulatory Visit (HOSPITAL_COMMUNITY): Payer: Self-pay

## 2022-04-24 ENCOUNTER — Other Ambulatory Visit (HOSPITAL_COMMUNITY): Payer: Self-pay

## 2022-05-02 ENCOUNTER — Other Ambulatory Visit: Payer: Self-pay

## 2022-05-02 ENCOUNTER — Observation Stay (HOSPITAL_COMMUNITY): Payer: Medicaid Other

## 2022-05-02 ENCOUNTER — Emergency Department (HOSPITAL_COMMUNITY): Payer: Medicaid Other

## 2022-05-02 ENCOUNTER — Inpatient Hospital Stay (HOSPITAL_COMMUNITY)
Admission: EM | Admit: 2022-05-02 | Discharge: 2022-05-07 | DRG: 446 | Disposition: A | Discharge status: Home / Self Care | Payer: Medicaid Other | Attending: Internal Medicine | Admitting: Internal Medicine

## 2022-05-02 ENCOUNTER — Encounter (HOSPITAL_COMMUNITY): Payer: Self-pay

## 2022-05-02 DIAGNOSIS — E872 Acidosis, unspecified: Secondary | ICD-10-CM | POA: Diagnosis present

## 2022-05-02 DIAGNOSIS — Z9049 Acquired absence of other specified parts of digestive tract: Secondary | ICD-10-CM

## 2022-05-02 DIAGNOSIS — K7010 Alcoholic hepatitis without ascites: Secondary | ICD-10-CM | POA: Diagnosis present

## 2022-05-02 DIAGNOSIS — Z79899 Other long term (current) drug therapy: Secondary | ICD-10-CM

## 2022-05-02 DIAGNOSIS — Z885 Allergy status to narcotic agent status: Secondary | ICD-10-CM

## 2022-05-02 DIAGNOSIS — Z85038 Personal history of other malignant neoplasm of large intestine: Secondary | ICD-10-CM

## 2022-05-02 DIAGNOSIS — K219 Gastro-esophageal reflux disease without esophagitis: Secondary | ICD-10-CM | POA: Diagnosis present

## 2022-05-02 DIAGNOSIS — M5432 Sciatica, left side: Secondary | ICD-10-CM | POA: Diagnosis present

## 2022-05-02 DIAGNOSIS — F1010 Alcohol abuse, uncomplicated: Secondary | ICD-10-CM | POA: Diagnosis present

## 2022-05-02 DIAGNOSIS — K8020 Calculus of gallbladder without cholecystitis without obstruction: Secondary | ICD-10-CM | POA: Diagnosis not present

## 2022-05-02 DIAGNOSIS — C189 Malignant neoplasm of colon, unspecified: Secondary | ICD-10-CM | POA: Diagnosis present

## 2022-05-02 DIAGNOSIS — R109 Unspecified abdominal pain: Principal | ICD-10-CM

## 2022-05-02 DIAGNOSIS — Z8249 Family history of ischemic heart disease and other diseases of the circulatory system: Secondary | ICD-10-CM

## 2022-05-02 DIAGNOSIS — E876 Hypokalemia: Secondary | ICD-10-CM | POA: Diagnosis present

## 2022-05-02 DIAGNOSIS — Z6828 Body mass index (BMI) 28.0-28.9, adult: Secondary | ICD-10-CM

## 2022-05-02 DIAGNOSIS — C182 Malignant neoplasm of ascending colon: Secondary | ICD-10-CM | POA: Diagnosis not present

## 2022-05-02 DIAGNOSIS — E663 Overweight: Secondary | ICD-10-CM | POA: Diagnosis present

## 2022-05-02 DIAGNOSIS — G40909 Epilepsy, unspecified, not intractable, without status epilepticus: Secondary | ICD-10-CM | POA: Diagnosis present

## 2022-05-02 DIAGNOSIS — I700 Atherosclerosis of aorta: Secondary | ICD-10-CM | POA: Diagnosis not present

## 2022-05-02 DIAGNOSIS — R1011 Right upper quadrant pain: Secondary | ICD-10-CM | POA: Diagnosis not present

## 2022-05-02 DIAGNOSIS — I10 Essential (primary) hypertension: Secondary | ICD-10-CM | POA: Diagnosis present

## 2022-05-02 DIAGNOSIS — Z9221 Personal history of antineoplastic chemotherapy: Secondary | ICD-10-CM

## 2022-05-02 DIAGNOSIS — F1011 Alcohol abuse, in remission: Secondary | ICD-10-CM | POA: Diagnosis present

## 2022-05-02 DIAGNOSIS — M5430 Sciatica, unspecified side: Secondary | ICD-10-CM

## 2022-05-02 DIAGNOSIS — R079 Chest pain, unspecified: Secondary | ICD-10-CM | POA: Diagnosis not present

## 2022-05-02 DIAGNOSIS — K8010 Calculus of gallbladder with chronic cholecystitis without obstruction: Principal | ICD-10-CM | POA: Diagnosis present

## 2022-05-02 HISTORY — DX: Malignant (primary) neoplasm, unspecified: C80.1

## 2022-05-02 LAB — CBC WITH DIFFERENTIAL/PLATELET
Abs Immature Granulocytes: 0.14 10*3/uL — ABNORMAL HIGH (ref 0.00–0.07)
Basophils Absolute: 0.1 10*3/uL (ref 0.0–0.1)
Basophils Relative: 0 %
Eosinophils Absolute: 0 10*3/uL (ref 0.0–0.5)
Eosinophils Relative: 0 %
HCT: 43.1 % (ref 39.0–52.0)
Hemoglobin: 13.4 g/dL (ref 13.0–17.0)
Immature Granulocytes: 1 %
Lymphocytes Relative: 12 %
Lymphs Abs: 2 10*3/uL (ref 0.7–4.0)
MCH: 30.2 pg (ref 26.0–34.0)
MCHC: 31.1 g/dL (ref 30.0–36.0)
MCV: 97.3 fL (ref 80.0–100.0)
Monocytes Absolute: 0.5 10*3/uL (ref 0.1–1.0)
Monocytes Relative: 3 %
Neutro Abs: 14.1 10*3/uL — ABNORMAL HIGH (ref 1.7–7.7)
Neutrophils Relative %: 84 %
Platelets: 236 10*3/uL (ref 150–400)
RBC: 4.43 MIL/uL (ref 4.22–5.81)
RDW: 12.3 % (ref 11.5–15.5)
WBC: 16.8 10*3/uL — ABNORMAL HIGH (ref 4.0–10.5)
nRBC: 0 % (ref 0.0–0.2)

## 2022-05-02 LAB — TROPONIN I (HIGH SENSITIVITY)
Troponin I (High Sensitivity): 2 ng/L (ref <18)
Troponin I (High Sensitivity): 3 ng/L (ref <18)

## 2022-05-02 LAB — COMPREHENSIVE METABOLIC PANEL
ALT: 219 U/L — ABNORMAL HIGH (ref 0–44)
AST: 391 U/L — ABNORMAL HIGH (ref 15–41)
Albumin: 4 g/dL (ref 3.5–5.0)
Alkaline Phosphatase: 139 U/L — ABNORMAL HIGH (ref 38–126)
Anion gap: 11 (ref 5–15)
BUN: 13 mg/dL (ref 6–20)
CO2: 23 mmol/L (ref 22–32)
Calcium: 9.1 mg/dL (ref 8.9–10.3)
Chloride: 106 mmol/L (ref 98–111)
Creatinine, Ser: 0.92 mg/dL (ref 0.61–1.24)
GFR, Estimated: >60 mL/min (ref >60)
Glucose, Bld: 180 mg/dL — ABNORMAL HIGH (ref 70–99)
Potassium: 3.7 mmol/L (ref 3.5–5.1)
Sodium: 140 mmol/L (ref 135–145)
Total Bilirubin: 2.2 mg/dL — ABNORMAL HIGH (ref 0.3–1.2)
Total Protein: 8.5 g/dL — ABNORMAL HIGH (ref 6.5–8.1)

## 2022-05-02 LAB — URINALYSIS, ROUTINE W REFLEX MICROSCOPIC
Bilirubin Urine: NEGATIVE
Glucose, UA: NEGATIVE mg/dL
Hgb urine dipstick: NEGATIVE
Ketones, ur: NEGATIVE mg/dL
Leukocytes,Ua: NEGATIVE
Nitrite: NEGATIVE
Protein, ur: NEGATIVE mg/dL
Specific Gravity, Urine: 1.02 (ref 1.005–1.030)
pH: 5 (ref 5.0–8.0)

## 2022-05-02 LAB — LIPASE, BLOOD: Lipase: 62 U/L — ABNORMAL HIGH (ref 11–51)

## 2022-05-02 MED ORDER — SODIUM CHLORIDE (PF) 0.9 % IJ SOLN
INTRAMUSCULAR | Status: AC
  Filled 2022-05-02: qty 50

## 2022-05-02 MED ORDER — FAMOTIDINE IN NACL 20-0.9 MG/50ML-% IV SOLN
20.0000 mg | Freq: Once | INTRAVENOUS | Status: AC
  Administered 2022-05-02: 20 mg via INTRAVENOUS
  Filled 2022-05-02: qty 50

## 2022-05-02 MED ORDER — PIPERACILLIN-TAZOBACTAM 4.5 G IVPB: 4.5000 g | Freq: Once | INTRAVENOUS | Status: DC

## 2022-05-02 MED ORDER — LACTATED RINGERS IV BOLUS
1000.0000 mL | Freq: Once | INTRAVENOUS | Status: AC
  Administered 2022-05-02: 1000 mL via INTRAVENOUS

## 2022-05-02 MED ORDER — ONDANSETRON HCL 4 MG/2ML IJ SOLN
4.0000 mg | Freq: Once | INTRAMUSCULAR | Status: AC
  Administered 2022-05-02: 4 mg via INTRAVENOUS
  Filled 2022-05-02: qty 2

## 2022-05-02 MED ORDER — PIPERACILLIN-TAZOBACTAM 3.375 G IVPB 30 MIN
3.3750 g | Freq: Once | INTRAVENOUS | Status: AC
  Administered 2022-05-02: 3.375 g via INTRAVENOUS
  Filled 2022-05-02: qty 50

## 2022-05-02 MED ORDER — IOHEXOL 350 MG/ML SOLN
100.0000 mL | Freq: Once | INTRAVENOUS | Status: AC | PRN
  Administered 2022-05-02: 100 mL via INTRAVENOUS

## 2022-05-02 MED ORDER — FENTANYL CITRATE PF 50 MCG/ML IJ SOSY
50.0000 ug | PREFILLED_SYRINGE | INTRAMUSCULAR | Status: DC | PRN
  Administered 2022-05-02 – 2022-05-03 (×2): 50 ug via INTRAVENOUS
  Filled 2022-05-02 (×2): qty 1

## 2022-05-02 MED ORDER — FENTANYL CITRATE PF 50 MCG/ML IJ SOSY
100.0000 ug | PREFILLED_SYRINGE | Freq: Once | INTRAMUSCULAR | Status: AC
  Administered 2022-05-02: 100 ug via INTRAVENOUS
  Filled 2022-05-02: qty 2

## 2022-05-02 NOTE — H&P (Signed)
History and Physical    Patient: Joseph Haney RCV:[id number] DOB: 1976-06-28 DOA: 05/02/2022 DOS: the patient was seen and examined on 05/02/2022 PCP: [facility]  Patient coming from: Home  Chief Complaint:  Chief Complaint  Patient presents with   Abdominal Pain   cancer patient   Headache   Chest Pain   HPI: Joseph Haney is a 46 y.o. male with medical history significant of alcohol abuse, seizure disorder, protein calorie malnutrition, GERD, history of colon cancer stage IIIc not on any active treatment who was brought in by wife secondary to abdominal pain, headache, as well as fever and chills at home.  Patient apparently had some fever at home with epigastric pain radiating to his back.  He had a similar episode about a month ago which has resolved.  Symptoms are associated with some nausea and vomiting but no diarrhea.  No melena, no hematemesis or bright red blood per rectum.  In the ER he was noted to have markedly elevated LFTs.  Initial suspicion for ascending cholangitis.  CT abdomen pelvis as well as ultrasound performed.  No evidence of cholecystitis or cholangitis.  He has elevated LFTs including alkaline phosphatase.  There is suspicion for this being cholelithiasis instead.  Patient is being admitted to the hospital for further evaluation and treatment.  His pain is normally controlled at home but not today.  There is also worse that the symptoms could reflect worsening of patient's alcoholic hepatitis.  Review of Systems: As mentioned in the history of present illness. All other systems reviewed and are negative. Past Medical History:  Diagnosis Date   Alcohol abuse    Cancer ([place])    GERD (gastroesophageal reflux disease)    Seizure [facility])    Past Surgical History:  Procedure Laterality Date   BIOPSY  07/22/2021   Procedure: BIOPSY;  Surgeon: [name], MD;  Location: [facility];  Service: Gastroenterology;;   COLON SURGERY      COLONOSCOPY WITH PROPOFOL N/A 07/22/2021   Procedure: COLONOSCOPY WITH PROPOFOL;  Surgeon: [name], MD;  Location: [facility];  Service: Gastroenterology;  Laterality: N/A;   NO PAST SURGERIES     SUBMUCOSAL TATTOO INJECTION  07/22/2021   Procedure: SUBMUCOSAL TATTOO INJECTION;  Surgeon: [name], MD;  Location: [facility];  Service: Gastroenterology;;   Social History:  reports that he has never smoked. He has never used smokeless tobacco. He reports that he does not currently use alcohol. He reports that he does not use drugs.  Allergies  Allergen Reactions   Morphine And Related Itching    Family History  Problem Relation Age of Onset   Coronary artery disease Mother    Coronary artery disease Father    Heart attack Father 86   Cancer Cousin 23       prostate    Prior to Admission medications   Medication Sig Start Date End Date Taking? Authorizing Provider  carvedilol (COREG) 25 MG tablet Take 1 tablet (25 mg total) by mouth 2 (two) times daily with a meal. 01/15/22   Truitt Merle, MD  cyclobenzaprine (FLEXERIL) 10 MG tablet Take 10-20 mg by mouth 2 (two) times daily as needed for muscle spasms. 06/25/21   [provider]  dexamethasone (DECADRON) 4 MG tablet Take 1 tablet (4 mg total) by mouth daily. Take for 3-5 days after chemo for nausea and fatigue 11/29/21   Truitt Merle, MD  docusate sodium (COLACE) 100 MG capsule Take 100 mg by mouth daily.  [provider]  ferrous sulfate 325 (65 FE) MG tablet Take 1 tablet (325 mg total) by mouth 2 (two) times daily with a meal. 08/01/21   [name], [name], MD  ondansetron (ZOFRAN) 4 MG tablet Take 1 tablet (4 mg total) by mouth every 8 (eight) hours as needed for nausea or vomiting. 04/03/22   Dorothyann Peng, [name]  ondansetron (ZOFRAN-ODT) 8 MG disintegrating tablet Take 1 tablet (8 mg total) by mouth every 8 (eight) hours as needed for nausea or vomiting. 11/29/21   Truitt Merle, MD  oxyCODONE (ROXICODONE)  5 MG immediate release tablet Take 1 tablet (5 mg total) by mouth every 6 (six) hours as needed for severe pain. 04/03/22   Dorothyann Peng, [name]  polyethylene glycol (MIRALAX / GLYCOLAX) 17 g packet Take 17 g by mouth daily as needed for mild constipation or moderate constipation. 08/01/21   [name], [name], MD  potassium chloride SA (KLOR-CON M) 20 MEQ tablet Take 1 tablet (20 mEq total) by mouth daily. Patient not taking: Reported on 04/03/2022 03/21/22   Truitt Merle, MD  prochlorperazine (COMPAZINE) 10 MG tablet Take 1 tablet (10 mg total) by mouth every 6 (six) hours as needed (Nausea or vomiting). 11/29/21   Truitt Merle, MD  promethazine (PHENERGAN) 25 MG tablet Take 1 tablet (25 mg total) by mouth every 6 (six) hours as needed for nausea or vomiting. 11/29/21   Truitt Merle, MD    Physical Exam: Vitals:   05/02/22 1939 05/02/22 2000 05/02/22 2033 05/02/22 2100  BP: (!) 131/93 (!) 168/104 137/71 (!) 159/103  Pulse: (!) 117 (!) 113 (!) 112 (!) 110  Resp: (!) '22 18 17 19  '$ Temp:      TempSrc:      SpO2: 96% 96% 98% 96%  Weight:      Height:       Physical Exam Vitals and nursing note reviewed.  Constitutional:      General: He is not in acute distress.    Appearance: He is well-developed.  HENT:     Head: Normocephalic and atraumatic.  Eyes:     Conjunctiva/sclera: Conjunctivae normal.  Cardiovascular:     Rate and Rhythm: Normal rate and regular rhythm.     Heart sounds: No murmur heard. Pulmonary:     Effort: Pulmonary effort is normal. No respiratory distress.     Breath sounds: Normal breath sounds.  Abdominal:     Palpations: Abdomen is soft.     Tenderness: There is abdominal tenderness in the right upper quadrant. There is guarding. There is no right CVA tenderness or left CVA tenderness.  Musculoskeletal:        General: No swelling.     Cervical back: Neck supple.  Skin:    General: Skin is warm and dry.     Capillary Refill: Capillary refill takes less than 2 seconds.   Neurological:     Mental Status: He is alert.  Psychiatric:        Mood and Affect: Mood normal.   Data Reviewed:  Glucose 180, alkaline phosphatase 139, lipase 62, AST 391, ALT 219, total protein 8.5, total bilirubin 2.2, albumin 4.0.  White count 16.8, urinalysis essentially negative.  Urine drug screen positive for opiates, CT chest abdomen pelvis and CT angiogram of the chest showed no PE.  Nonspecific prominent lymph nodes are noted in the mesentery in the right lower quadrant.  Evidence of cholelithiasis and low lung volumes  Assessment and Plan:   #1 cholelithiasis:  Probably multifactorial.  Patient also has history of alcohol abuse.  I suspect this may not be obstructive jaundice but reflective of alcoholic hepatitis.  Patient will be admitted and initiated on supportive care.  Right upper quadrant abdominal ultrasound is unrevealing.  Patient may benefit from MRCP and subsequent GI consult if symptoms persist.  #2 possible alcoholic hepatitis: He is LFTs elevation is not classic for obstruction.  With history of alcoholism I will suspect alcoholic hepatitis as well.  Monitor LFTs.  Hydrate and supportive care.  #3 alcohol abuse: Initiate CIWA protocol  #4 history of colon cancer: Defer to oncology  #5 fever with leukocytosis: No evidence of acute cholangitis.  With reported fever and leukocytosis we will watch no antibiotics at this point.  He has received Zosyn in the ER.  If he spikes a temperature then we will treat.    Advance Care Planning:   Code Status: Prior full code  Consults: None at this point but may require GI consult  Family Communication: Wife at bedside  Severity of Illness: The appropriate patient status for this patient is OBSERVATION. Observation status is judged to be reasonable and necessary in order to provide the required intensity of service to ensure the patient's safety. The patient's presenting symptoms, physical exam findings, and initial  radiographic and laboratory data in the context of their medical condition is felt to place them at decreased risk for further clinical deterioration. Furthermore, it is anticipated that the patient will be medically stable for discharge from the hospital within 2 midnights of admission.   AuthorBarbette Merino, MD 05/02/2022 10:23 PM  For on call review [email or web address].

## 2022-05-02 NOTE — ED Notes (Signed)
US tech at the bedside

## 2022-05-02 NOTE — ED Notes (Signed)
Pt return from [place]

## 2022-05-02 NOTE — ED Triage Notes (Signed)
Patient reports that he has colon cancer and finished his chemo treatment this past July.  Patient c/o abdominal pain, chest pain, and a headache x 2 days.

## 2022-05-02 NOTE — ED Provider Notes (Signed)
[facility] DEPT Provider Note   CSN: [id number] Arrival date & time: 05/02/22  1806     History Chief Complaint  Patient presents with   Abdominal Pain   cancer patient   Headache   Chest Pain    HPI Joseph Haney is a 46 y.o. male presenting for abdominal pain.  Patient states that he has had epigastric radiating to his back pain over the past 48 hours.  He had an episode a month ago that was very similar but ultimately resolved.  He endorses nausea and vomiting but denies fevers or chills syncope or shortness of breath.  Wife at bedside provides collateral history and states that he typically has well-controlled pain but this is a second paroxysm in the past month.  Poor p.o. intake today.  Patient's recorded medical, surgical, social, medication list and allergies were reviewed in the Snapshot window as part of the initial history.   Review of Systems   Review of Systems  Constitutional:  Negative for chills and fever.  HENT:  Negative for ear pain and sore throat.   Eyes:  Negative for pain and visual disturbance.  Respiratory:  Negative for cough and shortness of breath.   Cardiovascular:  Negative for chest pain and palpitations.  Gastrointestinal:  Positive for abdominal pain and nausea. Negative for vomiting.  Genitourinary:  Negative for dysuria and hematuria.  Musculoskeletal:  Negative for arthralgias and back pain.  Skin:  Negative for color change and rash.  Neurological:  Negative for seizures and syncope.  All other systems reviewed and are negative.   Physical Exam Updated Vital Signs BP (!) 159/103   Pulse (!) 110   Temp 97.8 F (36.6 C) (Oral)   Resp 19   Ht '5\' 7"'$  (1.702 m)   Wt 83.9 kg   SpO2 96%   BMI 28.98 kg/m  Physical Exam Vitals and nursing note reviewed.  Constitutional:      General: He is not in acute distress.    Appearance: He is well-developed.  HENT:     Head: Normocephalic and atraumatic.  Eyes:      Conjunctiva/sclera: Conjunctivae normal.  Cardiovascular:     Rate and Rhythm: Normal rate and regular rhythm.     Heart sounds: No murmur heard. Pulmonary:     Effort: Pulmonary effort is normal. No respiratory distress.     Breath sounds: Normal breath sounds.  Abdominal:     Palpations: Abdomen is soft.     Tenderness: There is abdominal tenderness in the right upper quadrant. There is guarding. There is no right CVA tenderness or left CVA tenderness.  Musculoskeletal:        General: No swelling.     Cervical back: Neck supple.  Skin:    General: Skin is warm and dry.     Capillary Refill: Capillary refill takes less than 2 seconds.  Neurological:     Mental Status: He is alert.  Psychiatric:        Mood and Affect: Mood normal.      ED Course/ Medical Decision Making/ A&P Clinical Course as of 05/02/22 2222  Fri May 02, 2022  2008 Pending CT [CC]    Clinical Course User Index [CC] Tretha Sciara, MD    Procedures Procedures   Medications Ordered in ED Medications  fentaNYL (SUBLIMAZE) injection 50 mcg (has no administration in time range)  lactated ringers bolus 1,000 mL (has no administration in time range)  piperacillin-tazobactam (ZOSYN) IVPB 3.375 g (  has no administration in time range)  fentaNYL (SUBLIMAZE) injection 100 mcg (100 mcg Intravenous Given 05/02/22 1946)  lactated ringers bolus 1,000 mL (0 mLs Intravenous Stopped 05/02/22 2114)  famotidine (PEPCID) IVPB 20 mg premix (0 mg Intravenous Stopped 05/02/22 2015)  ondansetron (ZOFRAN) injection 4 mg (4 mg Intravenous Given 05/02/22 1946)  sodium chloride (PF) 0.9 % injection (  Given by Other 05/02/22 2018)  iohexol (OMNIPAQUE) 350 MG/ML injection 100 mL (100 mLs Intravenous Contrast Given 05/02/22 2013)   Medical Decision Making:    Hall Birchard is a 46 y.o. male who presented to the ED today with abdominal pain, detailed above.    Patient's presentation is complicated by their history of multiple  comorbid medical problems include history of colon cancer, history of UTIs, substance use disorder.  Patient placed on continuous vitals and telemetry monitoring while in ED which was reviewed periodically.  Complete initial physical exam performed, notably the patient  was with severe pain in the right upper quadrant, writhing in the bed.     Reviewed and confirmed nursing documentation for past medical history, family history, social history.    Initial Assessment:   With the patient's presentation of abdominal pain, most likely diagnosis is biliary pathology. Other diagnoses were considered including (but not limited to) gastroenteritis, colitis, small bowel obstruction, appendicitis, cholecystitis, pancreatitis, nephrolithiasis, UTI, pyleonephritistesticular torsion. These are considered less likely due to history of present illness and physical exam findings.   This is most consistent with an acute life/limb threatening illness complicated by underlying chronic conditions.   Initial Plan:   CBC/CMP to evaluate for underlying infectious/metabolic etiology for patient's abdominal pain  Lipase to evaluate for pancreatitis  EKG to evaluate for cardiac source of pain  CTAB/Pelvis with contrast to evaluate for structural/surgical etiology of patients' severe abdominal pain.  Right upper quadrant ultrasound Urinalysis and repeat physical assessment to evaluate for UTI/Pyelonpehritis  Empiric management of symptoms with escalating pain control and antiemetics as needed.   Initial Study Results:    Laboratory  All laboratory results reviewed without evidence of clinically relevant pathology.   Exceptions include: Leukocytosis appreciated with left shift   Radiology All images reviewed independently. Agree with radiology report at this time.    US Abdomen Limited RUQ (LIVER/GB)  Final Result    CT ABDOMEN PELVIS W CONTRAST  Final Result    CT Angio Chest Pulmonary Embolism (PE) W or WO  Contrast  Final Result      Consults: Case discussed with hospitalist who agreed with need for admission.   Final Reassessment and Plan:   Ultimately, patient's history of present illness and physical exam findings remain nonspecific at this time.  He may have developing cholangitis versus symptomatic cholelithiasis.  Patient broadly treated with piperacillin tazobactam and recommended close inpatient observation and care.  As needed pain medications ordered, discussed case with hospitalist who agreed with need for admission for continued trending of labs.  Patient arranged for admission and there is no further acute events.     Clinical Impression:  1. Abdominal pain, unspecified abdominal location      Data Unavailable   Final Clinical Impression(s) / ED Diagnoses Final diagnoses:  Abdominal pain, unspecified abdominal location    Rx / DC Orders ED Discharge Orders     None         Tretha Sciara, MD 05/02/22 2222

## 2022-05-02 NOTE — ED Provider Triage Note (Signed)
Emergency Medicine Provider Triage Evaluation Note  Joseph Haney , a 46 y.o. male  was evaluated in triage.  Pt complains of 2-day duration of abdominal pain, chest pain, headache.  States he has an appointment with gastroenterology next month.  Since onset symptoms have gotten worse.  Review of Systems  Positive: As above Negative: As above  Physical Exam  BP (!) 148/91 (BP Location: Left Arm)   Pulse (!) 111   Temp 97.8 F (36.6 C) (Oral)   Resp 18   Ht '5\' 7"'$  (1.702 m)   Wt 83.9 kg   SpO2 94%   BMI 28.98 kg/m  Gen:   Awake, no distress   Resp:  Normal effort MSK:   Moves extremities without difficulty  Other:    Medical Decision Making  Medically screening exam initiated at 6:26 PM.  Appropriate orders placed.  [place] was informed that the remainder of the evaluation will be completed by another provider, this initial triage assessment does not replace that evaluation, and the importance of remaining in the ED until their evaluation is complete.     Evlyn Courier, [name] 05/02/22 1827

## 2022-05-03 ENCOUNTER — Encounter (HOSPITAL_COMMUNITY): Payer: Self-pay | Admitting: Internal Medicine

## 2022-05-03 ENCOUNTER — Observation Stay (HOSPITAL_COMMUNITY): Payer: Medicaid Other

## 2022-05-03 DIAGNOSIS — Z9221 Personal history of antineoplastic chemotherapy: Secondary | ICD-10-CM | POA: Diagnosis not present

## 2022-05-03 DIAGNOSIS — Z85038 Personal history of other malignant neoplasm of large intestine: Secondary | ICD-10-CM | POA: Diagnosis not present

## 2022-05-03 DIAGNOSIS — K8010 Calculus of gallbladder with chronic cholecystitis without obstruction: Secondary | ICD-10-CM | POA: Diagnosis not present

## 2022-05-03 DIAGNOSIS — E663 Overweight: Secondary | ICD-10-CM | POA: Diagnosis not present

## 2022-05-03 DIAGNOSIS — M5432 Sciatica, left side: Secondary | ICD-10-CM | POA: Diagnosis not present

## 2022-05-03 DIAGNOSIS — I700 Atherosclerosis of aorta: Secondary | ICD-10-CM | POA: Diagnosis not present

## 2022-05-03 DIAGNOSIS — I10 Essential (primary) hypertension: Secondary | ICD-10-CM

## 2022-05-03 DIAGNOSIS — Z885 Allergy status to narcotic agent status: Secondary | ICD-10-CM | POA: Diagnosis not present

## 2022-05-03 DIAGNOSIS — E876 Hypokalemia: Secondary | ICD-10-CM | POA: Diagnosis not present

## 2022-05-03 DIAGNOSIS — J9811 Atelectasis: Secondary | ICD-10-CM | POA: Diagnosis not present

## 2022-05-03 DIAGNOSIS — R935 Abnormal findings on diagnostic imaging of other abdominal regions, including retroperitoneum: Secondary | ICD-10-CM | POA: Diagnosis not present

## 2022-05-03 DIAGNOSIS — R109 Unspecified abdominal pain: Secondary | ICD-10-CM | POA: Diagnosis not present

## 2022-05-03 DIAGNOSIS — K219 Gastro-esophageal reflux disease without esophagitis: Secondary | ICD-10-CM | POA: Diagnosis not present

## 2022-05-03 DIAGNOSIS — F1011 Alcohol abuse, in remission: Secondary | ICD-10-CM | POA: Diagnosis not present

## 2022-05-03 DIAGNOSIS — M5430 Sciatica, unspecified side: Secondary | ICD-10-CM

## 2022-05-03 DIAGNOSIS — Z79899 Other long term (current) drug therapy: Secondary | ICD-10-CM | POA: Diagnosis not present

## 2022-05-03 DIAGNOSIS — Z9049 Acquired absence of other specified parts of digestive tract: Secondary | ICD-10-CM | POA: Diagnosis not present

## 2022-05-03 DIAGNOSIS — R079 Chest pain, unspecified: Secondary | ICD-10-CM | POA: Diagnosis not present

## 2022-05-03 DIAGNOSIS — G40909 Epilepsy, unspecified, not intractable, without status epilepticus: Secondary | ICD-10-CM | POA: Diagnosis not present

## 2022-05-03 DIAGNOSIS — Z6828 Body mass index (BMI) 28.0-28.9, adult: Secondary | ICD-10-CM | POA: Diagnosis not present

## 2022-05-03 DIAGNOSIS — C182 Malignant neoplasm of ascending colon: Secondary | ICD-10-CM | POA: Diagnosis not present

## 2022-05-03 DIAGNOSIS — R600 Localized edema: Secondary | ICD-10-CM | POA: Diagnosis not present

## 2022-05-03 DIAGNOSIS — R1011 Right upper quadrant pain: Secondary | ICD-10-CM | POA: Diagnosis not present

## 2022-05-03 DIAGNOSIS — Z8249 Family history of ischemic heart disease and other diseases of the circulatory system: Secondary | ICD-10-CM | POA: Diagnosis not present

## 2022-05-03 DIAGNOSIS — K8020 Calculus of gallbladder without cholecystitis without obstruction: Secondary | ICD-10-CM | POA: Diagnosis not present

## 2022-05-03 LAB — COMPREHENSIVE METABOLIC PANEL
ALT: 281 U/L — ABNORMAL HIGH (ref 0–44)
AST: 341 U/L — ABNORMAL HIGH (ref 15–41)
Albumin: 3.6 g/dL (ref 3.5–5.0)
Alkaline Phosphatase: 148 U/L — ABNORMAL HIGH (ref 38–126)
Anion gap: 7 (ref 5–15)
BUN: 10 mg/dL (ref 6–20)
CO2: 26 mmol/L (ref 22–32)
Calcium: 8.9 mg/dL (ref 8.9–10.3)
Chloride: 106 mmol/L (ref 98–111)
Creatinine, Ser: 0.8 mg/dL (ref 0.61–1.24)
GFR, Estimated: >60 mL/min (ref >60)
Glucose, Bld: 151 mg/dL — ABNORMAL HIGH (ref 70–99)
Potassium: 3.8 mmol/L (ref 3.5–5.1)
Sodium: 139 mmol/L (ref 135–145)
Total Bilirubin: 3.4 mg/dL — ABNORMAL HIGH (ref 0.3–1.2)
Total Protein: 8.1 g/dL (ref 6.5–8.1)

## 2022-05-03 LAB — HIV ANTIBODY (ROUTINE TESTING W REFLEX): HIV Screen 4th Generation wRfx: NONREACTIVE

## 2022-05-03 LAB — CBC
HCT: 38.3 % — ABNORMAL LOW (ref 39.0–52.0)
Hemoglobin: 12.7 g/dL — ABNORMAL LOW (ref 13.0–17.0)
MCH: 30.1 pg (ref 26.0–34.0)
MCHC: 33.2 g/dL (ref 30.0–36.0)
MCV: 90.8 fL (ref 80.0–100.0)
Platelets: 235 10*3/uL (ref 150–400)
RBC: 4.22 MIL/uL (ref 4.22–5.81)
RDW: 12.3 % (ref 11.5–15.5)
WBC: 15.4 10*3/uL — ABNORMAL HIGH (ref 4.0–10.5)
nRBC: 0 % (ref 0.0–0.2)

## 2022-05-03 MED ORDER — CARVEDILOL 25 MG PO TABS
25.0000 mg | ORAL_TABLET | Freq: Two times a day (BID) | ORAL | Status: DC
  Administered 2022-05-03 – 2022-05-07 (×9): 25 mg via ORAL
  Filled 2022-05-03 (×7): qty 1
  Filled 2022-05-03: qty 2
  Filled 2022-05-03: qty 1

## 2022-05-03 MED ORDER — IBUPROFEN 200 MG PO TABS
400.0000 mg | ORAL_TABLET | Freq: Once | ORAL | Status: AC
  Administered 2022-05-03: 400 mg via ORAL
  Filled 2022-05-03: qty 2

## 2022-05-03 MED ORDER — ADULT MULTIVITAMIN W/MINERALS CH
1.0000 | ORAL_TABLET | Freq: Every day | ORAL | Status: DC
Start: 2022-05-03 — End: 2022-05-07
  Administered 2022-05-03 – 2022-05-07 (×5): 1 via ORAL
  Filled 2022-05-03 (×5): qty 1

## 2022-05-03 MED ORDER — GADOBUTROL 1 MMOL/ML IV SOLN
8.0000 mL | Freq: Once | INTRAVENOUS | Status: AC | PRN
  Administered 2022-05-03: 8 mL via INTRAVENOUS

## 2022-05-03 MED ORDER — HYDROMORPHONE HCL 2 MG/ML IJ SOLN
0.5000 mg | INTRAMUSCULAR | Status: DC | PRN
  Administered 2022-05-03 – 2022-05-04 (×4): 0.5 mg via INTRAVENOUS
  Filled 2022-05-03 (×4): qty 1

## 2022-05-03 MED ORDER — ACETAMINOPHEN 325 MG PO TABS
650.0000 mg | ORAL_TABLET | Freq: Once | ORAL | Status: AC
Start: 2022-05-03 — End: 2022-05-03
  Administered 2022-05-03: 650 mg via ORAL
  Filled 2022-05-03: qty 2

## 2022-05-03 MED ORDER — LORAZEPAM 1 MG PO TABS: 1.0000 mg | ORAL_TABLET | ORAL | Status: DC | PRN

## 2022-05-03 MED ORDER — FOLIC ACID 1 MG PO TABS
1.0000 mg | ORAL_TABLET | Freq: Every day | ORAL | Status: DC
  Administered 2022-05-03: 1 mg via ORAL
  Filled 2022-05-03: qty 1

## 2022-05-03 MED ORDER — ACETAMINOPHEN 325 MG PO TABS
650.0000 mg | ORAL_TABLET | Freq: Four times a day (QID) | ORAL | Status: DC | PRN
  Administered 2022-05-04: 650 mg via ORAL
  Filled 2022-05-03: qty 2

## 2022-05-03 MED ORDER — CYCLOBENZAPRINE HCL 10 MG PO TABS
10.0000 mg | ORAL_TABLET | Freq: Three times a day (TID) | ORAL | Status: DC | PRN
Start: 2022-05-03 — End: 2022-05-07

## 2022-05-03 MED ORDER — ENOXAPARIN SODIUM 40 MG/0.4ML IJ SOSY
40.0000 mg | PREFILLED_SYRINGE | INTRAMUSCULAR | Status: DC
  Administered 2022-05-03 – 2022-05-07 (×5): 40 mg via SUBCUTANEOUS
  Filled 2022-05-03 (×5): qty 0.4

## 2022-05-03 MED ORDER — LORAZEPAM 2 MG/ML IJ SOLN: 0.0000 mg | Freq: Two times a day (BID) | INTRAMUSCULAR | Status: DC

## 2022-05-03 MED ORDER — THIAMINE MONONITRATE 100 MG PO TABS
100.0000 mg | ORAL_TABLET | Freq: Every day | ORAL | Status: DC
  Administered 2022-05-03: 100 mg via ORAL
  Filled 2022-05-03: qty 1

## 2022-05-03 MED ORDER — ONDANSETRON HCL 4 MG PO TABS: 4.0000 mg | ORAL_TABLET | Freq: Four times a day (QID) | ORAL | Status: DC | PRN

## 2022-05-03 MED ORDER — POLYETHYLENE GLYCOL 3350 17 G PO PACK
17.0000 g | PACK | Freq: Every day | ORAL | Status: DC | PRN
Start: 2022-05-03 — End: 2022-05-07

## 2022-05-03 MED ORDER — HYDROMORPHONE HCL 2 MG/ML IJ SOLN
0.5000 mg | INTRAMUSCULAR | Status: DC | PRN
  Administered 2022-05-03: 0.5 mg via INTRAVENOUS
  Filled 2022-05-03: qty 1

## 2022-05-03 MED ORDER — THIAMINE HCL 100 MG/ML IJ SOLN: 100.0000 mg | Freq: Every day | INTRAMUSCULAR | Status: DC

## 2022-05-03 MED ORDER — ONDANSETRON HCL 4 MG/2ML IJ SOLN
4.0000 mg | Freq: Four times a day (QID) | INTRAMUSCULAR | Status: DC | PRN
  Administered 2022-05-03 (×2): 4 mg via INTRAVENOUS
  Filled 2022-05-03 (×2): qty 2

## 2022-05-03 MED ORDER — LORAZEPAM 2 MG/ML IJ SOLN
0.0000 mg | Freq: Four times a day (QID) | INTRAMUSCULAR | Status: DC
  Filled 2022-05-03: qty 1

## 2022-05-03 MED ORDER — DEXTROSE IN LACTATED RINGERS 5 % IV SOLN: INTRAVENOUS | Status: DC

## 2022-05-03 MED ORDER — LORAZEPAM 2 MG/ML IJ SOLN: 1.0000 mg | INTRAMUSCULAR | Status: DC | PRN

## 2022-05-03 NOTE — ED Notes (Signed)
Patient is at MRI

## 2022-05-03 NOTE — ED Notes (Signed)
Pt's wife has food from [facility], reminded pt of diet order for only clear liquids, pt verbalized understanding.  Agrees to not eat any of wife's food until okay to do so by provider.

## 2022-05-03 NOTE — Consult Note (Signed)
Consult  CC: RUQ abdominal pain, elevated LFT's Referring: Dr. Reggy Eye  HPI: Joseph Haney is an 46 y.o. male with a previous surgical history of a laparoscopic assisted right partial colectomy last November by Dr. Rosendo Gros for stage IIIc colon cancer.  He has been on IV chemotherapy which stopped about a month ago.  He presents with a several day history of right upper quadrant abdominal pain, headache, and fever with pain hurting through to the back.  He reports a similar episode 1 month ago.  He is seen at [facility] and approximate 2 weeks ago had laboratory data which showed only a mildly elevated alkaline phosphatase.  He presented here was found to have elevated liver function test including a bilirubin greater than 2 which is now elevated to 3.2 an ultrasound yesterday showed likely sludge or small stones in the gallbladder with slight thickening of the gallbladder wall.  An MRCP today showed gallstones with subtle gallbladder wall edema and no evidence of choledocholithiasis.  He is currently on a diet and reports only mild right upper quadrant abdominal pain.  He has been moving his bowels well.  Past Medical History:  Diagnosis Date   Alcohol abuse    Cancer ([place])    GERD (gastroesophageal reflux disease)    Seizure [facility])     Past Surgical History:  Procedure Laterality Date   BIOPSY  07/22/2021   Procedure: BIOPSY;  Surgeon: [name], MD;  Location: [facility]COPY;  Service: Gastroenterology;;   COLON SURGERY     COLONOSCOPY WITH PROPOFOL N/A 07/22/2021   Procedure: COLONOSCOPY WITH PROPOFOL;  Surgeon: [name], MD;  Location: [facility];  Service: Gastroenterology;  Laterality: N/A;   NO PAST SURGERIES     SUBMUCOSAL TATTOO INJECTION  07/22/2021   Procedure: SUBMUCOSAL TATTOO INJECTION;  Surgeon: [name], MD;  Location: [facility];  Service: Gastroenterology;;    Family History  Problem Relation Age of Onset   Coronary artery disease  Mother    Coronary artery disease Father    Heart attack Father 51   Cancer Cousin 63       prostate    Social:  reports that he has never smoked. He has never used smokeless tobacco. He reports that he does not currently use alcohol. He reports that he does not use drugs.  Allergies:  Allergies  Allergen Reactions   Morphine And Related Itching    Medications: I have reviewed the patient's current medications.  Results for orders placed or performed during the hospital encounter of 05/02/22 (from the past 48 hour(s))  CBC with Differential     Status: Abnormal   Collection Time: 05/02/22  6:39 PM  Result Value Ref Range   WBC 16.8 (H) 4.0 - 10.5 K/uL   RBC 4.43 4.22 - 5.81 MIL/uL   Hemoglobin 13.4 13.0 - 17.0 g/dL   HCT 43.1 39.0 - 52.0 %   MCV 97.3 80.0 - 100.0 fL   MCH 30.2 26.0 - 34.0 pg   MCHC 31.1 30.0 - 36.0 g/dL   RDW 12.3 11.5 - 15.5 %   Platelets 236 150 - 400 K/uL   nRBC 0.0 0.0 - 0.2 %   Neutrophils Relative % 84 %   Neutro Abs 14.1 (H) 1.7 - 7.7 K/uL   Lymphocytes Relative 12 %   Lymphs Abs 2.0 0.7 - 4.0 K/uL   Monocytes Relative 3 %   Monocytes Absolute 0.5 0.1 - 1.0 K/uL   Eosinophils Relative 0 %  Eosinophils Absolute 0.0 0.0 - 0.5 K/uL   Basophils Relative 0 %   Basophils Absolute 0.1 0.0 - 0.1 K/uL   Immature Granulocytes 1 %   Abs Immature Granulocytes 0.14 (H) 0.00 - 0.07 K/uL    Comment: Performed at [facility], [place] [address], [place], [place] [address]  Comprehensive metabolic panel     Status: Abnormal   Collection Time: 05/02/22  6:39 PM  Result Value Ref Range   Sodium 140 135 - 145 mmol/L   Potassium 3.7 3.5 - 5.1 mmol/L   Chloride 106 98 - 111 mmol/L   CO2 23 22 - 32 mmol/L   Glucose, Bld 180 (H) 70 - 99 mg/dL    Comment: Glucose reference range applies only to samples taken after fasting for at least 8 hours.   BUN 13 6 - 20 mg/dL   Creatinine, Ser 0.92 0.61 - 1.24 mg/dL   Calcium 9.1 8.9 - 10.3 mg/dL   Total  Protein 8.5 (H) 6.5 - 8.1 g/dL   Albumin 4.0 3.5 - 5.0 g/dL   AST 391 (H) 15 - 41 U/L   ALT 219 (H) 0 - 44 U/L   Alkaline Phosphatase 139 (H) 38 - 126 U/L   Total Bilirubin 2.2 (H) 0.3 - 1.2 mg/dL   GFR, Estimated >60 >60 mL/min    Comment: (NOTE) Calculated using the CKD-EPI Creatinine Equation (2021)    Anion gap 11 5 - 15    Comment: Performed at [facility], [place] [address] [place], [place] [address]  Lipase, blood     Status: Abnormal   Collection Time: 05/02/22  6:39 PM  Result Value Ref Range   Lipase 62 (H) 11 - 51 U/L    Comment: Performed at [facility], [place] [address], [place], [place] [address]  Troponin I (High Sensitivity)     Status: None   Collection Time: 05/02/22  6:39 PM  Result Value Ref Range   Troponin I (High Sensitivity) 2 <18 ng/L    Comment: (NOTE) Elevated high sensitivity troponin I (hsTnI) values and significant  changes across serial measurements may suggest ACS but many other  chronic and acute conditions are known to elevate hsTnI results.  Refer to the "Links" section for chest pain algorithms and additional  guidance. Performed at [facility], [place] [address], [place], [place] [address]   Urinalysis, Routine w reflex microscopic Urine, Clean Catch     Status: Abnormal   Collection Time: 05/02/22  7:42 PM  Result Value Ref Range   Color, Urine AMBER (A) YELLOW    Comment: BIOCHEMICALS MAY BE AFFECTED BY COLOR   APPearance CLEAR CLEAR   Specific Gravity, Urine 1.020 1.005 - 1.030   pH 5.0 5.0 - 8.0   Glucose, UA NEGATIVE NEGATIVE mg/dL   Hgb urine dipstick NEGATIVE NEGATIVE   Bilirubin Urine NEGATIVE NEGATIVE   Ketones, ur NEGATIVE NEGATIVE mg/dL   Protein, ur NEGATIVE NEGATIVE mg/dL   Nitrite NEGATIVE NEGATIVE   Leukocytes,Ua NEGATIVE NEGATIVE    Comment: Performed at [facility], [place] [address] [place], [place] [address]  Troponin I (High Sensitivity)      Status: None   Collection Time: 05/02/22  8:22 PM  Result Value Ref Range   Troponin I (High Sensitivity) 3 <18 ng/L    Comment: (NOTE) Elevated high sensitivity troponin I (hsTnI) values and significant  changes across serial measurements may suggest ACS but many other  chronic  and acute conditions are known to elevate hsTnI results.  Refer to the "Links" section for chest pain algorithms and additional  guidance. Performed at [facility], [place] [address], [place], [place] [address]   Blood culture (routine x 2)     Status: None (Preliminary result)   Collection Time: 05/02/22 10:46 PM   Specimen: BLOOD  Result Value Ref Range   Specimen Description      BLOOD BLOOD LEFT ARM Performed at [place] [address] [place], [place] [address]    Special Requests      Blood Culture results may not be optimal due to an excessive volume of blood received in culture bottles Blood Culture adequate volume Performed at [place] [address] [place], [place] [address]    Culture      NO GROWTH < 12 HOURS Performed at [place] [address], [place], [place] [address]    Report Status PENDING   Blood culture (routine x 2)     Status: None (Preliminary result)   Collection Time: 05/02/22 11:00 PM   Specimen: BLOOD  Result Value Ref Range   Specimen Description      BLOOD BLOOD LEFT FOREARM Performed at [place] [address] [place], [place] [address]    Special Requests      BOTTLES DRAWN AEROBIC AND ANAEROBIC Blood Culture adequate volume Performed at [place] [address] [place], [place] [address]    Culture      NO GROWTH < 12 HOURS Performed at [place] [address] [place], [place] [address]    Report Status PENDING   Comprehensive metabolic panel     Status: Abnormal   Collection Time: 05/03/22  3:40 AM  Result Value Ref Range    Sodium 139 135 - 145 mmol/L   Potassium 3.8 3.5 - 5.1 mmol/L   Chloride 106 98 - 111 mmol/L   CO2 26 22 - 32 mmol/L   Glucose, Bld 151 (H) 70 - 99 mg/dL    Comment: Glucose reference range applies only to samples taken after fasting for at least 8 hours.   BUN 10 6 - 20 mg/dL   Creatinine, Ser 0.80 0.61 - 1.24 mg/dL   Calcium 8.9 8.9 - 10.3 mg/dL   Total Protein 8.1 6.5 - 8.1 g/dL   Albumin 3.6 3.5 - 5.0 g/dL   AST 341 (H) 15 - 41 U/L    Comment: RESULT CONFIRMED BY MANUAL DILUTION   ALT 281 (H) 0 - 44 U/L   Alkaline Phosphatase 148 (H) 38 - 126 U/L   Total Bilirubin 3.4 (H) 0.3 - 1.2 mg/dL   GFR, Estimated >60 >60 mL/min    Comment: (NOTE) Calculated using the CKD-EPI Creatinine Equation (2021)    Anion gap 7 5 - 15    Comment: Performed at [facility], [place] [address], [place], [place] [address]  CBC     Status: Abnormal   Collection Time: 05/03/22  3:40 AM  Result Value Ref Range   WBC 15.4 (H) 4.0 - 10.5 K/uL   RBC 4.22 4.22 - 5.81 MIL/uL   Hemoglobin 12.7 (L) 13.0 - 17.0 g/dL   HCT 38.3 (L) 39.0 - 52.0 %   MCV 90.8 80.0 - 100.0 fL   MCH 30.1 26.0 - 34.0 pg   MCHC 33.2 30.0 - 36.0 g/dL   RDW 12.3 11.5 - 15.5 %  Platelets 235 150 - 400 K/uL   nRBC 0.0 0.0 - 0.2 %    Comment: Performed at [facility], [place] [address], [place], [place] [address]  HIV Antibody (routine testing w rflx)     Status: None   Collection Time: 05/03/22  3:40 AM  Result Value Ref Range   HIV Screen 4th Generation wRfx Non Reactive Non Reactive    Comment: Performed at [facility], [place] [address] [place], [place] [address]    MR ABDOMEN MRCP W WO CONTAST  Result Date: 05/03/2022 CLINICAL DATA:  Abdominal pain.  History of colon cancer. EXAM: MRI ABDOMEN WITHOUT AND WITH CONTRAST (INCLUDING MRCP) TECHNIQUE: Multiplanar multisequence MR imaging of the abdomen was performed both before and after the administration of intravenous contrast. Heavily  T2-weighted images of the biliary and pancreatic ducts were obtained, and three-dimensional MRCP images were rendered by post processing. CONTRAST:  71m GADAVIST GADOBUTROL 1 MMOL/ML IV SOLN COMPARISON:  Ultrasound exam and CT scan from earlier same day FINDINGS: Lower chest: Atelectasis noted right lung base. Hepatobiliary: No suspicious focal abnormality within the liver parenchyma. No focal restricted diffusion within the liver parenchyma. Layering tiny gallstones evident. Subtle gallbladder wall edema noted without substantial gallbladder wall thickening. Common duct measures upper normal at approximately 6 mm diameter. Common bile duct in the head of the pancreas is 5-6 mm diameter. No choledocholithiasis. Pancreas: No focal mass lesion. No dilatation of the main duct. No intraparenchymal cyst. No peripancreatic edema. Spleen:  No splenomegaly. No focal mass lesion. Adrenals/Urinary Tract: No adrenal nodule or mass. Kidneys unremarkable. Stomach/Bowel: Stomach is unremarkable. No gastric wall thickening. No evidence of outlet obstruction. Duodenum is normally positioned as is the ligament of Treitz. No small bowel or colonic dilatation within the visualized abdomen. Vascular/Lymphatic: No abdominal aortic aneurysm. No abdominal lymphadenopathy. Other:  No intraperitoneal free fluid. Musculoskeletal: No focal suspicious marrow enhancement within the visualized bony anatomy. IMPRESSION: 1. Cholelithiasis with probable subtle gallbladder wall edema. No substantial gallbladder wall thickening. 2. No biliary dilatation.  No choledocholithiasis. Electronically Signed   By: EMisty StanleyM.D.   On: 05/03/2022 08:51   MR 3D Recon At Scanner  Result Date: 05/03/2022 CLINICAL DATA:  Abdominal pain.  History of colon cancer. EXAM: MRI ABDOMEN WITHOUT AND WITH CONTRAST (INCLUDING MRCP) TECHNIQUE: Multiplanar multisequence MR imaging of the abdomen was performed both before and after the administration of intravenous  contrast. Heavily T2-weighted images of the biliary and pancreatic ducts were obtained, and three-dimensional MRCP images were rendered by post processing. CONTRAST:  814mGADAVIST GADOBUTROL 1 MMOL/ML IV SOLN COMPARISON:  Ultrasound exam and CT scan from earlier same day FINDINGS: Lower chest: Atelectasis noted right lung base. Hepatobiliary: No suspicious focal abnormality within the liver parenchyma. No focal restricted diffusion within the liver parenchyma. Layering tiny gallstones evident. Subtle gallbladder wall edema noted without substantial gallbladder wall thickening. Common duct measures upper normal at approximately 6 mm diameter. Common bile duct in the head of the pancreas is 5-6 mm diameter. No choledocholithiasis. Pancreas: No focal mass lesion. No dilatation of the main duct. No intraparenchymal cyst. No peripancreatic edema. Spleen:  No splenomegaly. No focal mass lesion. Adrenals/Urinary Tract: No adrenal nodule or mass. Kidneys unremarkable. Stomach/Bowel: Stomach is unremarkable. No gastric wall thickening. No evidence of outlet obstruction. Duodenum is normally positioned as is the ligament of Treitz. No small bowel or colonic dilatation within the visualized abdomen. Vascular/Lymphatic: No abdominal aortic aneurysm. No abdominal lymphadenopathy. Other:  No  intraperitoneal free fluid. Musculoskeletal: No focal suspicious marrow enhancement within the visualized bony anatomy. IMPRESSION: 1. Cholelithiasis with probable subtle gallbladder wall edema. No substantial gallbladder wall thickening. 2. No biliary dilatation.  No choledocholithiasis. Electronically Signed   By: Misty Stanley M.D.   On: 05/03/2022 08:51   US Abdomen Limited RUQ (LIVER/GB)  Result Date: 05/02/2022 CLINICAL DATA:  Abdominal pain. EXAM: ULTRASOUND ABDOMEN LIMITED RIGHT UPPER QUADRANT COMPARISON:  CT abdomen pelvis dated 05/02/2022. FINDINGS: Gallbladder: Layering echogenic debris within the gallbladder, likely sludge or  small stones. The gallbladder wall is slightly thickened measuring 6 mm, likely partially related to underdistention. No pericholecystic fluid. Negative sonographic Murphy's sign. Common bile duct: Diameter: 6 mm Liver: No focal lesion identified. Within normal limits in parenchymal echogenicity. Portal vein is patent on color Doppler imaging with normal direction of blood flow towards the liver. Other: None. IMPRESSION: Gallbladder sludge or small stones. No sonographic evidence of acute cholecystitis. Electronically Signed   By: Anner Crete M.D.   On: 05/02/2022 21:57   CT ABDOMEN PELVIS W CONTRAST  Result Date: 05/02/2022 CLINICAL DATA:  Pulmonary embolism suspected, high probability. Finished chemo in July for colon cancer. Chest pain and abdominal pain for 2 days. EXAM: CT ANGIOGRAPHY CHEST CT ABDOMEN AND PELVIS WITH CONTRAST TECHNIQUE: Multidetector CT imaging of the chest was performed using the standard protocol during bolus administration of intravenous contrast. Multiplanar CT image reconstructions and MIPs were obtained to evaluate the vascular anatomy. Multidetector CT imaging of the abdomen and pelvis was performed using the standard protocol during bolus administration of intravenous contrast. RADIATION DOSE REDUCTION: This exam was performed according to the departmental dose-optimization program which includes automated exposure control, adjustment of the mA and/or kV according to patient size and/or use of iterative reconstruction technique. CONTRAST:  150m OMNIPAQUE IOHEXOL 350 MG/ML SOLN COMPARISON:  04/03/2022. FINDINGS: CTA CHEST FINDINGS Cardiovascular: The heart is normal in size and there is no pericardial effusion. Scattered coronary artery calcifications are noted. The aorta and pulmonary trunk are normal in caliber. No definite evidence of pulmonary embolism. Examination in the mid to lower lung fields is limited due to respiratory motion and mixing artifact. Mediastinum/Nodes: No  mediastinal, hilar, or axillary lymphadenopathy. The thyroid gland, trachea, and esophagus are stable. Lungs/Pleura: Lung volumes are low and strandy opacities are present at the lung bases, possible atelectasis or infiltrate. No effusion or pneumothorax. Musculoskeletal: Degenerative changes in the thoracic spine. No acute or suspicious osseous abnormality. Review of the MIP images confirms the above findings. CT ABDOMEN and PELVIS FINDINGS Hepatobiliary: No focal liver abnormality is seen. Stones are present in the gallbladder. No biliary ductal dilatation. Pancreas: Unremarkable. No pancreatic ductal dilatation or surrounding inflammatory changes. Spleen: Normal in size without focal abnormality. Adrenals/Urinary Tract: The adrenal glands are within normal limits. The kidneys enhance symmetrically. No renal calculus or hydronephrosis. The bladder is unremarkable. Stomach/Bowel: The stomach is within normal limits. Right hemicolectomy changes are noted. No bowel obstruction, free air, or pneumatosis. No focal bowel wall thickening or inflammatory changes. Vascular/Lymphatic: Aortic atherosclerosis. A few prominent lymph nodes are present in the mesentery in the right lower quadrant measuring up to 9 mm, increased in size from the prior exam. Reproductive: Prostate is unremarkable. Other: No abdominopelvic ascites. Musculoskeletal: No acute or suspicious osseous abnormality. Review of the MIP images confirms the above findings. IMPRESSION: 1. No definite evidence of pulmonary embolism. Evaluation of the pulmonary arteries in the mid to lower lung fields is limited due to mixing artifact  and respiratory motion. 2. Low lung volumes with strandy atelectasis or infiltrate at the lung bases. 3. Nonspecific prominent lymph nodes are noted in the mesentery in the right lower quadrant, increased in size from the prior exam. Attention on follow-up is recommended. 4. Cholelithiasis. 5. Aortic atherosclerosis and coronary  artery calcifications. Electronically Signed   By: Brett Fairy M.D.   On: 05/02/2022 20:48   CT Angio Chest Pulmonary Embolism (PE) W or WO Contrast  Result Date: 05/02/2022 CLINICAL DATA:  Pulmonary embolism suspected, high probability. Finished chemo in July for colon cancer. Chest pain and abdominal pain for 2 days. EXAM: CT ANGIOGRAPHY CHEST CT ABDOMEN AND PELVIS WITH CONTRAST TECHNIQUE: Multidetector CT imaging of the chest was performed using the standard protocol during bolus administration of intravenous contrast. Multiplanar CT image reconstructions and MIPs were obtained to evaluate the vascular anatomy. Multidetector CT imaging of the abdomen and pelvis was performed using the standard protocol during bolus administration of intravenous contrast. RADIATION DOSE REDUCTION: This exam was performed according to the departmental dose-optimization program which includes automated exposure control, adjustment of the mA and/or kV according to patient size and/or use of iterative reconstruction technique. CONTRAST:  164m OMNIPAQUE IOHEXOL 350 MG/ML SOLN COMPARISON:  04/03/2022. FINDINGS: CTA CHEST FINDINGS Cardiovascular: The heart is normal in size and there is no pericardial effusion. Scattered coronary artery calcifications are noted. The aorta and pulmonary trunk are normal in caliber. No definite evidence of pulmonary embolism. Examination in the mid to lower lung fields is limited due to respiratory motion and mixing artifact. Mediastinum/Nodes: No mediastinal, hilar, or axillary lymphadenopathy. The thyroid gland, trachea, and esophagus are stable. Lungs/Pleura: Lung volumes are low and strandy opacities are present at the lung bases, possible atelectasis or infiltrate. No effusion or pneumothorax. Musculoskeletal: Degenerative changes in the thoracic spine. No acute or suspicious osseous abnormality. Review of the MIP images confirms the above findings. CT ABDOMEN and PELVIS FINDINGS Hepatobiliary:  No focal liver abnormality is seen. Stones are present in the gallbladder. No biliary ductal dilatation. Pancreas: Unremarkable. No pancreatic ductal dilatation or surrounding inflammatory changes. Spleen: Normal in size without focal abnormality. Adrenals/Urinary Tract: The adrenal glands are within normal limits. The kidneys enhance symmetrically. No renal calculus or hydronephrosis. The bladder is unremarkable. Stomach/Bowel: The stomach is within normal limits. Right hemicolectomy changes are noted. No bowel obstruction, free air, or pneumatosis. No focal bowel wall thickening or inflammatory changes. Vascular/Lymphatic: Aortic atherosclerosis. A few prominent lymph nodes are present in the mesentery in the right lower quadrant measuring up to 9 mm, increased in size from the prior exam. Reproductive: Prostate is unremarkable. Other: No abdominopelvic ascites. Musculoskeletal: No acute or suspicious osseous abnormality. Review of the MIP images confirms the above findings. IMPRESSION: 1. No definite evidence of pulmonary embolism. Evaluation of the pulmonary arteries in the mid to lower lung fields is limited due to mixing artifact and respiratory motion. 2. Low lung volumes with strandy atelectasis or infiltrate at the lung bases. 3. Nonspecific prominent lymph nodes are noted in the mesentery in the right lower quadrant, increased in size from the prior exam. Attention on follow-up is recommended. 4. Cholelithiasis. 5. Aortic atherosclerosis and coronary artery calcifications. Electronically Signed   By: LBrett FairyM.D.   On: 05/02/2022 20:48    ROS - all of the below systems have been reviewed with the patient and positives are indicated with bold text General: chills, fever or night sweats Eyes: blurry vision or double vision ENT: epistaxis or  sore throat Allergy/Immunology: itchy/watery eyes or nasal congestion Hematologic/Lymphatic: bleeding problems, blood clots or swollen lymph  nodes Endocrine: temperature intolerance or unexpected weight changes Breast: new or changing breast lumps or nipple discharge Resp: cough, shortness of breath, or wheezing CV: chest pain or dyspnea on exertion GI: as per HPI GU: dysuria, trouble voiding, or hematuria MSK: joint pain or joint stiffness Neuro: TIA or stroke symptoms Derm: pruritus and skin lesion changes Psych: anxiety and depression  PE Blood pressure (!) 142/104, pulse 93, temperature 99 F (37.2 C), temperature source Oral, resp. rate 18, height '5\' 7"'$  (1.702 m), weight 83.9 kg, SpO2 100 %. Constitutional: NAD; conversant; no deformities Eyes: Moist conjunctiva; no lid lag; anicteric; PERRL Neck: Trachea midline; no thyromegaly Lungs: Normal respiratory effort; no tactile fremitus CV: RRR; no palpable thrills; no pitting edema GI: Abd soft with some right upper quadrant tenderness and guarding.  No hernias.; no palpable hepatosplenomegaly MSK: Normal range of motion of extremities; no clubbing/cyanosis Psychiatric: Appropriate affect; alert and oriented x3 Lymphatic: No palpable cervical or axillary lymphadenopathy  Results for orders placed or performed during the hospital encounter of 05/02/22 (from the past 48 hour(s))  CBC with Differential     Status: Abnormal   Collection Time: 05/02/22  6:39 PM  Result Value Ref Range   WBC 16.8 (H) 4.0 - 10.5 K/uL   RBC 4.43 4.22 - 5.81 MIL/uL   Hemoglobin 13.4 13.0 - 17.0 g/dL   HCT 43.1 39.0 - 52.0 %   MCV 97.3 80.0 - 100.0 fL   MCH 30.2 26.0 - 34.0 pg   MCHC 31.1 30.0 - 36.0 g/dL   RDW 12.3 11.5 - 15.5 %   Platelets 236 150 - 400 K/uL   nRBC 0.0 0.0 - 0.2 %   Neutrophils Relative % 84 %   Neutro Abs 14.1 (H) 1.7 - 7.7 K/uL   Lymphocytes Relative 12 %   Lymphs Abs 2.0 0.7 - 4.0 K/uL   Monocytes Relative 3 %   Monocytes Absolute 0.5 0.1 - 1.0 K/uL   Eosinophils Relative 0 %   Eosinophils Absolute 0.0 0.0 - 0.5 K/uL   Basophils Relative 0 %   Basophils  Absolute 0.1 0.0 - 0.1 K/uL   Immature Granulocytes 1 %   Abs Immature Granulocytes 0.14 (H) 0.00 - 0.07 K/uL    Comment: Performed at [facility], [place] [address] [place], [place] [address]  Comprehensive metabolic panel     Status: Abnormal   Collection Time: 05/02/22  6:39 PM  Result Value Ref Range   Sodium 140 135 - 145 mmol/L   Potassium 3.7 3.5 - 5.1 mmol/L   Chloride 106 98 - 111 mmol/L   CO2 23 22 - 32 mmol/L   Glucose, Bld 180 (H) 70 - 99 mg/dL    Comment: Glucose reference range applies only to samples taken after fasting for at least 8 hours.   BUN 13 6 - 20 mg/dL   Creatinine, Ser 0.92 0.61 - 1.24 mg/dL   Calcium 9.1 8.9 - 10.3 mg/dL   Total Protein 8.5 (H) 6.5 - 8.1 g/dL   Albumin 4.0 3.5 - 5.0 g/dL   AST 391 (H) 15 - 41 U/L   ALT 219 (H) 0 - 44 U/L   Alkaline Phosphatase 139 (H) 38 - 126 U/L   Total Bilirubin 2.2 (H) 0.3 - 1.2 mg/dL   GFR, Estimated >60 >60 mL/min    Comment: (NOTE) Calculated using the CKD-EPI Creatinine Equation (2021)  Anion gap 11 5 - 15    Comment: Performed at [facility], [place] [address] [place], [place] [address]  Lipase, blood     Status: Abnormal   Collection Time: 05/02/22  6:39 PM  Result Value Ref Range   Lipase 62 (H) 11 - 51 U/L    Comment: Performed at [facility], [place] [address] [place], [place] [address]  Troponin I (High Sensitivity)     Status: None   Collection Time: 05/02/22  6:39 PM  Result Value Ref Range   Troponin I (High Sensitivity) 2 <18 ng/L    Comment: (NOTE) Elevated high sensitivity troponin I (hsTnI) values and significant  changes across serial measurements may suggest ACS but many other  chronic and acute conditions are known to elevate hsTnI results.  Refer to the "Links" section for chest pain algorithms and additional  guidance. Performed at [facility], [place] [address] [place], [place] [address]   Urinalysis, Routine w  reflex microscopic Urine, Clean Catch     Status: Abnormal   Collection Time: 05/02/22  7:42 PM  Result Value Ref Range   Color, Urine AMBER (A) YELLOW    Comment: BIOCHEMICALS MAY BE AFFECTED BY COLOR   APPearance CLEAR CLEAR   Specific Gravity, Urine 1.020 1.005 - 1.030   pH 5.0 5.0 - 8.0   Glucose, UA NEGATIVE NEGATIVE mg/dL   Hgb urine dipstick NEGATIVE NEGATIVE   Bilirubin Urine NEGATIVE NEGATIVE   Ketones, ur NEGATIVE NEGATIVE mg/dL   Protein, ur NEGATIVE NEGATIVE mg/dL   Nitrite NEGATIVE NEGATIVE   Leukocytes,Ua NEGATIVE NEGATIVE    Comment: Performed at [facility], [place] [address], [place], [place] [address]  Troponin I (High Sensitivity)     Status: None   Collection Time: 05/02/22  8:22 PM  Result Value Ref Range   Troponin I (High Sensitivity) 3 <18 ng/L    Comment: (NOTE) Elevated high sensitivity troponin I (hsTnI) values and significant  changes across serial measurements may suggest ACS but many other  chronic and acute conditions are known to elevate hsTnI results.  Refer to the "Links" section for chest pain algorithms and additional  guidance. Performed at [facility], [place] [address] [place], [place] [address]   Blood culture (routine x 2)     Status: None (Preliminary result)   Collection Time: 05/02/22 10:46 PM   Specimen: BLOOD  Result Value Ref Range   Specimen Description      BLOOD BLOOD LEFT ARM Performed at [place] [address], [place], [place] [address]    Special Requests      Blood Culture results may not be optimal due to an excessive volume of blood received in culture bottles Blood Culture adequate volume Performed at [place] [address] [place], [place] [address]    Culture      NO GROWTH < 12 HOURS Performed at [place] [address], [place], [place] [address]    Report Status PENDING   Blood culture (routine x 2)     Status:  None (Preliminary result)   Collection Time: 05/02/22 11:00 PM   Specimen: BLOOD  Result Value Ref Range   Specimen Description      BLOOD BLOOD LEFT FOREARM Performed at [place] [address] [place], [place] [address]    Special Requests      BOTTLES DRAWN AEROBIC AND ANAEROBIC  Blood Culture adequate volume Performed at [place] [address] [place], [place] [address]    Culture      NO GROWTH < 12 HOURS Performed at [place] [address], [place], [place] [address]    Report Status PENDING   Comprehensive metabolic panel     Status: Abnormal   Collection Time: 05/03/22  3:40 AM  Result Value Ref Range   Sodium 139 135 - 145 mmol/L   Potassium 3.8 3.5 - 5.1 mmol/L   Chloride 106 98 - 111 mmol/L   CO2 26 22 - 32 mmol/L   Glucose, Bld 151 (H) 70 - 99 mg/dL    Comment: Glucose reference range applies only to samples taken after fasting for at least 8 hours.   BUN 10 6 - 20 mg/dL   Creatinine, Ser 0.80 0.61 - 1.24 mg/dL   Calcium 8.9 8.9 - 10.3 mg/dL   Total Protein 8.1 6.5 - 8.1 g/dL   Albumin 3.6 3.5 - 5.0 g/dL   AST 341 (H) 15 - 41 U/L    Comment: RESULT CONFIRMED BY MANUAL DILUTION   ALT 281 (H) 0 - 44 U/L   Alkaline Phosphatase 148 (H) 38 - 126 U/L   Total Bilirubin 3.4 (H) 0.3 - 1.2 mg/dL   GFR, Estimated >60 >60 mL/min    Comment: (NOTE) Calculated using the CKD-EPI Creatinine Equation (2021)    Anion gap 7 5 - 15    Comment: Performed at [facility], [place] [address], [name], [place] [address]  CBC     Status: Abnormal   Collection Time: 05/03/22  3:40 AM  Result Value Ref Range   WBC 15.4 (H) 4.0 - 10.5 K/uL   RBC 4.22 4.22 - 5.81 MIL/uL   Hemoglobin 12.7 (L) 13.0 - 17.0 g/dL   HCT 38.3 (L) 39.0 - 52.0 %   MCV 90.8 80.0 - 100.0 fL   MCH 30.1 26.0 - 34.0 pg   MCHC 33.2 30.0 - 36.0 g/dL   RDW 12.3 11.5 - 15.5 %   Platelets 235 150 - 400 K/uL   nRBC 0.0 0.0 - 0.2 %     Comment: Performed at [facility], [place] [address] [place], [place] [address]  HIV Antibody (routine testing w rflx)     Status: None   Collection Time: 05/03/22  3:40 AM  Result Value Ref Range   HIV Screen 4th Generation wRfx Non Reactive Non Reactive    Comment: Performed at [facility], [place] [address], [place], [place] [address]    MR ABDOMEN MRCP W WO CONTAST  Result Date: 05/03/2022 CLINICAL DATA:  Abdominal pain.  History of colon cancer. EXAM: MRI ABDOMEN WITHOUT AND WITH CONTRAST (INCLUDING MRCP) TECHNIQUE: Multiplanar multisequence MR imaging of the abdomen was performed both before and after the administration of intravenous contrast. Heavily T2-weighted images of the biliary and pancreatic ducts were obtained, and three-dimensional MRCP images were rendered by post processing. CONTRAST:  18m GADAVIST GADOBUTROL 1 MMOL/ML IV SOLN COMPARISON:  Ultrasound exam and CT scan from earlier same day FINDINGS: Lower chest: Atelectasis noted right lung base. Hepatobiliary: No suspicious focal abnormality within the liver parenchyma. No focal restricted diffusion within the liver parenchyma. Layering tiny gallstones evident. Subtle gallbladder wall edema noted without substantial gallbladder wall thickening. Common duct measures upper normal at approximately 6 mm diameter. Common bile duct in the head of the pancreas is 5-6 mm diameter. No choledocholithiasis.  Pancreas: No focal mass lesion. No dilatation of the main duct. No intraparenchymal cyst. No peripancreatic edema. Spleen:  No splenomegaly. No focal mass lesion. Adrenals/Urinary Tract: No adrenal nodule or mass. Kidneys unremarkable. Stomach/Bowel: Stomach is unremarkable. No gastric wall thickening. No evidence of outlet obstruction. Duodenum is normally positioned as is the ligament of Treitz. No small bowel or colonic dilatation within the visualized abdomen. Vascular/Lymphatic: No abdominal aortic aneurysm. No  abdominal lymphadenopathy. Other:  No intraperitoneal free fluid. Musculoskeletal: No focal suspicious marrow enhancement within the visualized bony anatomy. IMPRESSION: 1. Cholelithiasis with probable subtle gallbladder wall edema. No substantial gallbladder wall thickening. 2. No biliary dilatation.  No choledocholithiasis. Electronically Signed   By: Misty Stanley M.D.   On: 05/03/2022 08:51   MR 3D Recon At Scanner  Result Date: 05/03/2022 CLINICAL DATA:  Abdominal pain.  History of colon cancer. EXAM: MRI ABDOMEN WITHOUT AND WITH CONTRAST (INCLUDING MRCP) TECHNIQUE: Multiplanar multisequence MR imaging of the abdomen was performed both before and after the administration of intravenous contrast. Heavily T2-weighted images of the biliary and pancreatic ducts were obtained, and three-dimensional MRCP images were rendered by post processing. CONTRAST:  86m GADAVIST GADOBUTROL 1 MMOL/ML IV SOLN COMPARISON:  Ultrasound exam and CT scan from earlier same day FINDINGS: Lower chest: Atelectasis noted right lung base. Hepatobiliary: No suspicious focal abnormality within the liver parenchyma. No focal restricted diffusion within the liver parenchyma. Layering tiny gallstones evident. Subtle gallbladder wall edema noted without substantial gallbladder wall thickening. Common duct measures upper normal at approximately 6 mm diameter. Common bile duct in the head of the pancreas is 5-6 mm diameter. No choledocholithiasis. Pancreas: No focal mass lesion. No dilatation of the main duct. No intraparenchymal cyst. No peripancreatic edema. Spleen:  No splenomegaly. No focal mass lesion. Adrenals/Urinary Tract: No adrenal nodule or mass. Kidneys unremarkable. Stomach/Bowel: Stomach is unremarkable. No gastric wall thickening. No evidence of outlet obstruction. Duodenum is normally positioned as is the ligament of Treitz. No small bowel or colonic dilatation within the visualized abdomen. Vascular/Lymphatic: No abdominal  aortic aneurysm. No abdominal lymphadenopathy. Other:  No intraperitoneal free fluid. Musculoskeletal: No focal suspicious marrow enhancement within the visualized bony anatomy. IMPRESSION: 1. Cholelithiasis with probable subtle gallbladder wall edema. No substantial gallbladder wall thickening. 2. No biliary dilatation.  No choledocholithiasis. Electronically Signed   By: EMisty StanleyM.D.   On: 05/03/2022 08:51   UKoreaAbdomen Limited RUQ (LIVER/GB)  Result Date: 05/02/2022 CLINICAL DATA:  Abdominal pain. EXAM: ULTRASOUND ABDOMEN LIMITED RIGHT UPPER QUADRANT COMPARISON:  CT abdomen pelvis dated 05/02/2022. FINDINGS: Gallbladder: Layering echogenic debris within the gallbladder, likely sludge or small stones. The gallbladder wall is slightly thickened measuring 6 mm, likely partially related to underdistention. No pericholecystic fluid. Negative sonographic Murphy's sign. Common bile duct: Diameter: 6 mm Liver: No focal lesion identified. Within normal limits in parenchymal echogenicity. Portal vein is patent on color Doppler imaging with normal direction of blood flow towards the liver. Other: None. IMPRESSION: Gallbladder sludge or small stones. No sonographic evidence of acute cholecystitis. Electronically Signed   By: AAnner CreteM.D.   On: 05/02/2022 21:57   CT ABDOMEN PELVIS W CONTRAST  Result Date: 05/02/2022 CLINICAL DATA:  Pulmonary embolism suspected, high probability. Finished chemo in July for colon cancer. Chest pain and abdominal pain for 2 days. EXAM: CT ANGIOGRAPHY CHEST CT ABDOMEN AND PELVIS WITH CONTRAST TECHNIQUE: Multidetector CT imaging of the chest was performed using the standard protocol during bolus administration of intravenous contrast. Multiplanar CT  image reconstructions and MIPs were obtained to evaluate the vascular anatomy. Multidetector CT imaging of the abdomen and pelvis was performed using the standard protocol during bolus administration of intravenous contrast.  RADIATION DOSE REDUCTION: This exam was performed according to the departmental dose-optimization program which includes automated exposure control, adjustment of the mA and/or kV according to patient size and/or use of iterative reconstruction technique. CONTRAST:  12m OMNIPAQUE IOHEXOL 350 MG/ML SOLN COMPARISON:  04/03/2022. FINDINGS: CTA CHEST FINDINGS Cardiovascular: The heart is normal in size and there is no pericardial effusion. Scattered coronary artery calcifications are noted. The aorta and pulmonary trunk are normal in caliber. No definite evidence of pulmonary embolism. Examination in the mid to lower lung fields is limited due to respiratory motion and mixing artifact. Mediastinum/Nodes: No mediastinal, hilar, or axillary lymphadenopathy. The thyroid gland, trachea, and esophagus are stable. Lungs/Pleura: Lung volumes are low and strandy opacities are present at the lung bases, possible atelectasis or infiltrate. No effusion or pneumothorax. Musculoskeletal: Degenerative changes in the thoracic spine. No acute or suspicious osseous abnormality. Review of the MIP images confirms the above findings. CT ABDOMEN and PELVIS FINDINGS Hepatobiliary: No focal liver abnormality is seen. Stones are present in the gallbladder. No biliary ductal dilatation. Pancreas: Unremarkable. No pancreatic ductal dilatation or surrounding inflammatory changes. Spleen: Normal in size without focal abnormality. Adrenals/Urinary Tract: The adrenal glands are within normal limits. The kidneys enhance symmetrically. No renal calculus or hydronephrosis. The bladder is unremarkable. Stomach/Bowel: The stomach is within normal limits. Right hemicolectomy changes are noted. No bowel obstruction, free air, or pneumatosis. No focal bowel wall thickening or inflammatory changes. Vascular/Lymphatic: Aortic atherosclerosis. A few prominent lymph nodes are present in the mesentery in the right lower quadrant measuring up to 9 mm, increased  in size from the prior exam. Reproductive: Prostate is unremarkable. Other: No abdominopelvic ascites. Musculoskeletal: No acute or suspicious osseous abnormality. Review of the MIP images confirms the above findings. IMPRESSION: 1. No definite evidence of pulmonary embolism. Evaluation of the pulmonary arteries in the mid to lower lung fields is limited due to mixing artifact and respiratory motion. 2. Low lung volumes with strandy atelectasis or infiltrate at the lung bases. 3. Nonspecific prominent lymph nodes are noted in the mesentery in the right lower quadrant, increased in size from the prior exam. Attention on follow-up is recommended. 4. Cholelithiasis. 5. Aortic atherosclerosis and coronary artery calcifications. Electronically Signed   By: LBrett FairyM.D.   On: 05/02/2022 20:48   CT Angio Chest Pulmonary Embolism (PE) W or WO Contrast  Result Date: 05/02/2022 CLINICAL DATA:  Pulmonary embolism suspected, high probability. Finished chemo in July for colon cancer. Chest pain and abdominal pain for 2 days. EXAM: CT ANGIOGRAPHY CHEST CT ABDOMEN AND PELVIS WITH CONTRAST TECHNIQUE: Multidetector CT imaging of the chest was performed using the standard protocol during bolus administration of intravenous contrast. Multiplanar CT image reconstructions and MIPs were obtained to evaluate the vascular anatomy. Multidetector CT imaging of the abdomen and pelvis was performed using the standard protocol during bolus administration of intravenous contrast. RADIATION DOSE REDUCTION: This exam was performed according to the departmental dose-optimization program which includes automated exposure control, adjustment of the mA and/or kV according to patient size and/or use of iterative reconstruction technique. CONTRAST:  1047mOMNIPAQUE IOHEXOL 350 MG/ML SOLN COMPARISON:  04/03/2022. FINDINGS: CTA CHEST FINDINGS Cardiovascular: The heart is normal in size and there is no pericardial effusion. Scattered coronary  artery calcifications are noted. The aorta and pulmonary  trunk are normal in caliber. No definite evidence of pulmonary embolism. Examination in the mid to lower lung fields is limited due to respiratory motion and mixing artifact. Mediastinum/Nodes: No mediastinal, hilar, or axillary lymphadenopathy. The thyroid gland, trachea, and esophagus are stable. Lungs/Pleura: Lung volumes are low and strandy opacities are present at the lung bases, possible atelectasis or infiltrate. No effusion or pneumothorax. Musculoskeletal: Degenerative changes in the thoracic spine. No acute or suspicious osseous abnormality. Review of the MIP images confirms the above findings. CT ABDOMEN and PELVIS FINDINGS Hepatobiliary: No focal liver abnormality is seen. Stones are present in the gallbladder. No biliary ductal dilatation. Pancreas: Unremarkable. No pancreatic ductal dilatation or surrounding inflammatory changes. Spleen: Normal in size without focal abnormality. Adrenals/Urinary Tract: The adrenal glands are within normal limits. The kidneys enhance symmetrically. No renal calculus or hydronephrosis. The bladder is unremarkable. Stomach/Bowel: The stomach is within normal limits. Right hemicolectomy changes are noted. No bowel obstruction, free air, or pneumatosis. No focal bowel wall thickening or inflammatory changes. Vascular/Lymphatic: Aortic atherosclerosis. A few prominent lymph nodes are present in the mesentery in the right lower quadrant measuring up to 9 mm, increased in size from the prior exam. Reproductive: Prostate is unremarkable. Other: No abdominopelvic ascites. Musculoskeletal: No acute or suspicious osseous abnormality. Review of the MIP images confirms the above findings. IMPRESSION: 1. No definite evidence of pulmonary embolism. Evaluation of the pulmonary arteries in the mid to lower lung fields is limited due to mixing artifact and respiratory motion. 2. Low lung volumes with strandy atelectasis or  infiltrate at the lung bases. 3. Nonspecific prominent lymph nodes are noted in the mesentery in the right lower quadrant, increased in size from the prior exam. Attention on follow-up is recommended. 4. Cholelithiasis. 5. Aortic atherosclerosis and coronary artery calcifications. Electronically Signed   By: Brett Fairy M.D.   On: 05/02/2022 20:48    I have personally reviewed the relevant ultrasound, MRCP, and laboratory data  A/P: Cholelithiasis with elevated liver function tests and suspected cholecystitis  Given his previous history of similar attacks and his recent basically normal liver function tests, I do suspect this is cholecystitis with gallstones.  I doubt this is hepatitis or related to his chemotherapy.  I explained gallbladder disease and symptomatic gallstones to the patient.  I would recommend a laparoscopic cholecystectomy with cholangiogram.  I explained the surgical procedure to him in detail including the risks.  He is very hesitant to have any surgery currently because he is feeling better.  I explained to him that his white blood count was elevated and his liver function tests have increased. He was still like to hold on surgery currently.  I will make him n.p.o. tonight in case he changes his mind.  We could consider a HIDA scan to evaluate for cholecystitis in the interim if this may be difficult to get over the weekend.  We will reassess him in the morning and see how his laboratory data is trending.  Complex medical decision making  Coralie Keens, MD [facility], [facility]

## 2022-05-03 NOTE — ED Notes (Signed)
Verbal order from Dr. Jonelle Sidle to cancel CIWA orders

## 2022-05-03 NOTE — ED Notes (Signed)
Pt reports he has not had any alcohol in 8 years, reports he told the attending Dr. Jonelle Sidle he had stop drinking. Order for CIWA noted.

## 2022-05-03 NOTE — ED Notes (Signed)
Have not gotten response from inpatient attending, pt still c/o headache, asked ED [name] Rebekak S, for Tylenol d/t pt's headache, verbal order given for 650 mg Tylenol. Refer to [facility]

## 2022-05-03 NOTE — ED Notes (Addendum)
Message sent to inpatient attending requesting Tylenol as pt c/o headache. No PRN order, pt only has dilaudid and fentanyl PRN for "severe pain" associated with abdominal pain. Awaiting response.

## 2022-05-03 NOTE — ED Notes (Signed)
Pt reports headache has not improved, requesting additional Tylenol, secure message sent to attending, order for IBU to be placed, refer to [facility].

## 2022-05-03 NOTE — ED Notes (Signed)
Family remains at the bedside. Pt given meal tray.

## 2022-05-03 NOTE — ED Notes (Signed)
Pt appears to be sleeping, observe even RR and unlabored, NAD noted, side rails up x2 for safety, plan of care ongoing, call light within reach, no further concerns as of present.   

## 2022-05-03 NOTE — ED Notes (Signed)
Pt vomited x1 on floor and sheets, floor cleaned and mopped and bed linens changed. PRN Zofran order, refer to [facility] for administration

## 2022-05-03 NOTE — Progress Notes (Signed)
[facility] Progress Note  Patient: Joseph Haney     ZOX:[id number]  DOA: 05/02/2022   PCP: Olga Coaster, [facility]       Brief hospital course: Is a 46 year old male with colon cancer status post partial colectomy, iron deficiency anemia, sciatica, and prior history of alcohol abuse.  He presents to the hospital for nausea and vomiting that started yesterday.  He is complaining of chest and abdominal pain.  He has abdominal pain present on the right side.  He states that he was seen by his PCP on August 16 and it was noted that his LFTs were elevated.  There was some further work-up planned but he was not having any symptoms at that time. Upon reviewing his labs, it appears that his AST and ALT have been elevated since 8/3 and are not much higher at this time. He was also taking oxycodone about 3 times a day up until Monday but he had no GI symptoms until Friday.  CT of the abdomen and pelvis shows cholelithiasis. CTA of the chest was unrevealing. Ultrasound of the abdomen shows gallbladder sludge or small stones. MRCP reveals cholelithiasis with probable subtle gallbladder wall edema no choledocholithiasis or bile duct dilatation.  Subjective:  As pain in his abdomen and points to the right upper and lower abdomen.  He has some chest pain as well.  States he has pain in his right leg and was previously prescribed oxycodone for it but for some reason it is no longer being prescribed to him.  Assessment and Plan: Principal Problem:   Cholelithiasis, ?  Mild cholecystitis-leukocytosis - Have asked for surgical opinion -Trial of solid food as I am not 100% convinced that this is acute cholecystitis elevated LFTs have been present for about a month now and his symptoms just started yesterday  - ?  Gastroenteritis-less likely narcotic withdrawal - Repeat LFTs tomorrow - She received Zosyn in the ED yesterday-we will hold off for now on further doses - Continue IV  fluids until I am sure that he is able to tolerate a diet  Active Problems:    Adenocarcinoma of ascending colon stage IIIc -Status post colectomy - Has completed Xeloda at the end of 6/23 -In remission at this point - Restaging on 02/03/2022 revealed no evidence of recurrence or metastasis.  Hypertension - Continue carvedilol   DVT prophylaxis:  enoxaparin (LOVENOX) injection 40 mg Start: 05/03/22 1000   Code Status: Full Code  Consultants: General surgery Level of Care: Level of care: Telemetry Disposition Plan:  Follow to see how he tolerates diet and follow-up on general surgery input  Objective:   Vitals:   05/03/22 1006 05/03/22 1330 05/03/22 1415 05/03/22 1423  BP: (!) 162/102  (!) 158/99   Pulse: (!) 105 89 90   Resp: 18  16   Temp: 98.1 F (36.7 C)   98.1 F (36.7 C)  TempSrc: Oral   Oral  SpO2: 98% 94% 97%   Weight:      Height:       Filed Weights   05/02/22 1817  Weight: 83.9 kg   Exam: General exam: Appears comfortable  HEENT: PERRLA, oral mucosa moist, no sclera icterus or thrush Respiratory system: Clear to auscultation. Respiratory effort normal. Cardiovascular system: S1 & S2 heard, regular rate and rhythm Gastrointestinal system: Abdomen soft, tender in right upper and lower quadrants, mildly distended, normal bowel sounds   Central nervous system: Alert and oriented. No focal neurological deficits. Extremities: No cyanosis,  clubbing or edema Skin: No rashes or ulcers Psychiatry:  Mood & affect appropriate.    Imaging and lab data was personally reviewed    CBC: Recent Labs  Lab 05/02/22 1839 05/03/22 0340  WBC 16.8* 15.4*  NEUTROABS 14.1*  --   HGB 13.4 12.7*  HCT 43.1 38.3*  MCV 97.3 90.8  PLT 236 924   Basic Metabolic Panel: Recent Labs  Lab 05/02/22 1839 05/03/22 0340  NA 140 139  K 3.7 3.8  CL 106 106  CO2 23 26  GLUCOSE 180* 151*  BUN 13 10  CREATININE 0.92 0.80  CALCIUM 9.1 8.9   GFR: Estimated Creatinine  Clearance: 118.2 mL/min (by C-G formula based on SCr of 0.8 mg/dL).  Scheduled Meds:  carvedilol  25 mg Oral BID WC   enoxaparin (LOVENOX) injection  40 mg Subcutaneous Q24H   multivitamin with minerals  1 tablet Oral Daily   Continuous Infusions:  dextrose 5% lactated ringers 75 mL/hr at 05/03/22 0043     LOS: 0 days   Author: Debbe Odea  05/03/2022 2:32 PM

## 2022-05-03 NOTE — ED Notes (Signed)
Another secure message sent to Dr. Earlean Polka regarding pt still c/o headache, asking if can get an order for Tylenol? Pt has PRN dilaudid and fentanyl for severe pain for abd, but didn't think that was appropriate for headache. Still awaiting response.

## 2022-05-04 DIAGNOSIS — R109 Unspecified abdominal pain: Secondary | ICD-10-CM

## 2022-05-04 DIAGNOSIS — M5432 Sciatica, left side: Secondary | ICD-10-CM | POA: Diagnosis not present

## 2022-05-04 DIAGNOSIS — C182 Malignant neoplasm of ascending colon: Secondary | ICD-10-CM | POA: Diagnosis not present

## 2022-05-04 DIAGNOSIS — K8020 Calculus of gallbladder without cholecystitis without obstruction: Secondary | ICD-10-CM | POA: Diagnosis not present

## 2022-05-04 LAB — COMPREHENSIVE METABOLIC PANEL
ALT: 187 U/L — ABNORMAL HIGH (ref 0–44)
AST: 129 U/L — ABNORMAL HIGH (ref 15–41)
Albumin: 3.4 g/dL — ABNORMAL LOW (ref 3.5–5.0)
Alkaline Phosphatase: 185 U/L — ABNORMAL HIGH (ref 38–126)
Anion gap: 8 (ref 5–15)
BUN: 9 mg/dL (ref 6–20)
CO2: 24 mmol/L (ref 22–32)
Calcium: 8.6 mg/dL — ABNORMAL LOW (ref 8.9–10.3)
Chloride: 105 mmol/L (ref 98–111)
Creatinine, Ser: 0.83 mg/dL (ref 0.61–1.24)
GFR, Estimated: >60 mL/min (ref >60)
Glucose, Bld: 114 mg/dL — ABNORMAL HIGH (ref 70–99)
Potassium: 3.3 mmol/L — ABNORMAL LOW (ref 3.5–5.1)
Sodium: 137 mmol/L (ref 135–145)
Total Bilirubin: 3.9 mg/dL — ABNORMAL HIGH (ref 0.3–1.2)
Total Protein: 7.7 g/dL (ref 6.5–8.1)

## 2022-05-04 LAB — CBC
HCT: 35.1 % — ABNORMAL LOW (ref 39.0–52.0)
Hemoglobin: 11.6 g/dL — ABNORMAL LOW (ref 13.0–17.0)
MCH: 30.1 pg (ref 26.0–34.0)
MCHC: 33 g/dL (ref 30.0–36.0)
MCV: 91.2 fL (ref 80.0–100.0)
Platelets: 205 10*3/uL (ref 150–400)
RBC: 3.85 MIL/uL — ABNORMAL LOW (ref 4.22–5.81)
RDW: 12.6 % (ref 11.5–15.5)
WBC: 7.5 10*3/uL (ref 4.0–10.5)
nRBC: 0 % (ref 0.0–0.2)

## 2022-05-04 MED ORDER — PIPERACILLIN-TAZOBACTAM 3.375 G IVPB
3.3750 g | Freq: Three times a day (TID) | INTRAVENOUS | Status: DC
  Administered 2022-05-04 – 2022-05-07 (×8): 3.375 g via INTRAVENOUS
  Filled 2022-05-04 (×9): qty 50

## 2022-05-04 MED ORDER — POTASSIUM CHLORIDE CRYS ER 20 MEQ PO TBCR
40.0000 meq | EXTENDED_RELEASE_TABLET | Freq: Once | ORAL | Status: AC
  Administered 2022-05-04: 40 meq via ORAL
  Filled 2022-05-04: qty 2

## 2022-05-04 NOTE — Progress Notes (Signed)
[facility] Progress Note  Patient: Joseph Haney     RXV:[id number]  DOA: 05/02/2022   PCP: Olga Coaster, [facility]       Brief hospital course: Is a 46 year old male with colon cancer status post partial colectomy, iron deficiency anemia, sciatica, and prior history of alcohol abuse.  He presents to the hospital for nausea and vomiting that started yesterday.  He is complaining of chest and abdominal pain.  He has abdominal pain present on the right side.  He states that he was seen by his PCP on August 16 and it was noted that his LFTs were elevated.  There was some further work-up planned but he was not having any symptoms at that time. He was also taking oxycodone about 3 times a day up until Monday but he had no GI symptoms until Friday.  CT of the abdomen and pelvis shows cholelithiasis. CTA of the chest was unrevealing. Ultrasound of the abdomen shows gallbladder sludge or small stones. MRCP reveals cholelithiasis with probable subtle gallbladder wall edema no choledocholithiasis or bile duct dilatation.  Subjective:  Using Dilaudid for abdominal pain. Left leq pain is better. No other complaints.   Assessment and Plan: Principal Problem:   Cholelithiasis, ?  Mild cholecystitis-leukocytosis - Have asked for surgical opinion -cont Zosyn- leukocytosis and LFTs improved - general surgery feel he he likely has cholecystitis -NPO for IiDA   Active Problems:    Adenocarcinoma of ascending colon stage IIIc -Status post colectomy - Has completed Xeloda at the end of 6/23 -In remission at this point - Restaging on 02/03/2022 revealed no evidence of recurrence or metastasis.  Hypertension - Continue carvedilol  Left leg pain - Sciatica documented in chart   DVT prophylaxis:  enoxaparin (LOVENOX) injection 40 mg Start: 05/03/22 1000   Code Status: Full Code  Consultants: General surgery Level of Care: Level of care: Telemetry Disposition Plan:   Follow to see how he tolerates diet and follow-up on general surgery input  Objective:   Vitals:   05/04/22 0242 05/04/22 0506 05/04/22 1007 05/04/22 1357  BP: (!) 160/94 (!) 139/90 128/85 (!) 141/93  Pulse: (!) 104 100 84 82  Resp: '18 17 16 20  '$ Temp: 100 F (37.8 C) 99.4 F (37.4 C) 98.2 F (36.8 C) 97.9 F (36.6 C)  TempSrc: Oral Oral Oral Oral  SpO2: 97% 96% 95% 96%  Weight:      Height:       Filed Weights   05/02/22 1817  Weight: 83.9 kg   Exam: General exam: Appears comfortable  HEENT: oral mucosa moist Respiratory system: Clear to auscultation.  Cardiovascular system: S1 & S2 heard  Gastrointestinal system: Abdomen soft, non-tender (examined after IV pain medicine), nondistended. Normal bowel sounds   Extremities: No cyanosis, clubbing or edema Psychiatry:  Mood & affect appropriate.    Imaging and lab data was personally reviewed    CBC: Recent Labs  Lab 05/02/22 1839 05/03/22 0340 05/04/22 0612  WBC 16.8* 15.4* 7.5  NEUTROABS 14.1*  --   --   HGB 13.4 12.7* 11.6*  HCT 43.1 38.3* 35.1*  MCV 97.3 90.8 91.2  PLT 236 235 509    Basic Metabolic Panel: Recent Labs  Lab 05/02/22 1839 05/03/22 0340 05/04/22 0612  NA 140 139 137  K 3.7 3.8 3.3*  CL 106 106 105  CO2 '23 26 24  '$ GLUCOSE 180* 151* 114*  BUN '13 10 9  '$ CREATININE 0.92 0.80 0.83  CALCIUM 9.1 8.9 8.6*  GFR: Estimated Creatinine Clearance: 113.9 mL/min (by C-G formula based on SCr of 0.83 mg/dL).  Scheduled Meds:  carvedilol  25 mg Oral BID WC   enoxaparin (LOVENOX) injection  40 mg Subcutaneous Q24H   multivitamin with minerals  1 tablet Oral Daily   Continuous Infusions:  dextrose 5% lactated ringers 75 mL/hr at 05/04/22 1520   piperacillin-tazobactam (ZOSYN)  IV Stopped (05/04/22 1325)     LOS: 1 day   Author: Debbe Odea  05/04/2022 3:42 PM

## 2022-05-04 NOTE — Progress Notes (Signed)
Subjective/Chief Complaint: No complaints. Feels better. Nauseated overnight   Objective: Vital signs in last 24 hours: Temp:  [98.1 F (36.7 C)-100 F (37.8 C)] 99.4 F (37.4 C) (09/03 0506) Pulse Rate:  [88-105] 100 (09/03 0506) Resp:  [16-18] 17 (09/03 0506) BP: (117-162)/(79-104) 139/90 (09/03 0506) SpO2:  [94 %-100 %] 96 % (09/03 0506)    Intake/Output from previous day: 09/02 0701 - 09/03 0700 In: 1016.1 [I.V.:1016.1] Out: -  Intake/Output this shift: No intake/output data recorded.  General appearance: alert and cooperative Resp: clear to auscultation bilaterally Cardio: regular rate and rhythm GI: soft, mild RUQ tenderness  Lab Results:  Recent Labs    05/03/22 0340 05/04/22 0612  WBC 15.4* 7.5  HGB 12.7* 11.6*  HCT 38.3* 35.1*  PLT 235 205   BMET Recent Labs    05/03/22 0340 05/04/22 0612  NA 139 137  K 3.8 3.3*  CL 106 105  CO2 26 24  GLUCOSE 151* 114*  BUN 10 9  CREATININE 0.80 0.83  CALCIUM 8.9 8.6*   PT/INR No results for input(s): "LABPROT", "INR" in the last 72 hours. ABG No results for input(s): "PHART", "HCO3" in the last 72 hours.  Invalid input(s): "PCO2", "PO2"  Studies/Results: MR ABDOMEN MRCP W WO CONTAST  Result Date: 05/03/2022 CLINICAL DATA:  Abdominal pain.  History of colon cancer. EXAM: MRI ABDOMEN WITHOUT AND WITH CONTRAST (INCLUDING MRCP) TECHNIQUE: Multiplanar multisequence MR imaging of the abdomen was performed both before and after the administration of intravenous contrast. Heavily T2-weighted images of the biliary and pancreatic ducts were obtained, and three-dimensional MRCP images were rendered by post processing. CONTRAST:  45m GADAVIST GADOBUTROL 1 MMOL/ML IV SOLN COMPARISON:  Ultrasound exam and CT scan from earlier same day FINDINGS: Lower chest: Atelectasis noted right lung base. Hepatobiliary: No suspicious focal abnormality within the liver parenchyma. No focal restricted diffusion within the liver  parenchyma. Layering tiny gallstones evident. Subtle gallbladder wall edema noted without substantial gallbladder wall thickening. Common duct measures upper normal at approximately 6 mm diameter. Common bile duct in the head of the pancreas is 5-6 mm diameter. No choledocholithiasis. Pancreas: No focal mass lesion. No dilatation of the main duct. No intraparenchymal cyst. No peripancreatic edema. Spleen:  No splenomegaly. No focal mass lesion. Adrenals/Urinary Tract: No adrenal nodule or mass. Kidneys unremarkable. Stomach/Bowel: Stomach is unremarkable. No gastric wall thickening. No evidence of outlet obstruction. Duodenum is normally positioned as is the ligament of Treitz. No small bowel or colonic dilatation within the visualized abdomen. Vascular/Lymphatic: No abdominal aortic aneurysm. No abdominal lymphadenopathy. Other:  No intraperitoneal free fluid. Musculoskeletal: No focal suspicious marrow enhancement within the visualized bony anatomy. IMPRESSION: 1. Cholelithiasis with probable subtle gallbladder wall edema. No substantial gallbladder wall thickening. 2. No biliary dilatation.  No choledocholithiasis. Electronically Signed   By: EMisty StanleyM.D.   On: 05/03/2022 08:51   MR 3D Recon At Scanner  Result Date: 05/03/2022 CLINICAL DATA:  Abdominal pain.  History of colon cancer. EXAM: MRI ABDOMEN WITHOUT AND WITH CONTRAST (INCLUDING MRCP) TECHNIQUE: Multiplanar multisequence MR imaging of the abdomen was performed both before and after the administration of intravenous contrast. Heavily T2-weighted images of the biliary and pancreatic ducts were obtained, and three-dimensional MRCP images were rendered by post processing. CONTRAST:  827mGADAVIST GADOBUTROL 1 MMOL/ML IV SOLN COMPARISON:  Ultrasound exam and CT scan from earlier same day FINDINGS: Lower chest: Atelectasis noted right lung base. Hepatobiliary: No suspicious focal abnormality within the liver parenchyma. No focal  restricted diffusion  within the liver parenchyma. Layering tiny gallstones evident. Subtle gallbladder wall edema noted without substantial gallbladder wall thickening. Common duct measures upper normal at approximately 6 mm diameter. Common bile duct in the head of the pancreas is 5-6 mm diameter. No choledocholithiasis. Pancreas: No focal mass lesion. No dilatation of the main duct. No intraparenchymal cyst. No peripancreatic edema. Spleen:  No splenomegaly. No focal mass lesion. Adrenals/Urinary Tract: No adrenal nodule or mass. Kidneys unremarkable. Stomach/Bowel: Stomach is unremarkable. No gastric wall thickening. No evidence of outlet obstruction. Duodenum is normally positioned as is the ligament of Treitz. No small bowel or colonic dilatation within the visualized abdomen. Vascular/Lymphatic: No abdominal aortic aneurysm. No abdominal lymphadenopathy. Other:  No intraperitoneal free fluid. Musculoskeletal: No focal suspicious marrow enhancement within the visualized bony anatomy. IMPRESSION: 1. Cholelithiasis with probable subtle gallbladder wall edema. No substantial gallbladder wall thickening. 2. No biliary dilatation.  No choledocholithiasis. Electronically Signed   By: Misty Stanley M.D.   On: 05/03/2022 08:51   US Abdomen Limited RUQ (LIVER/GB)  Result Date: 05/02/2022 CLINICAL DATA:  Abdominal pain. EXAM: ULTRASOUND ABDOMEN LIMITED RIGHT UPPER QUADRANT COMPARISON:  CT abdomen pelvis dated 05/02/2022. FINDINGS: Gallbladder: Layering echogenic debris within the gallbladder, likely sludge or small stones. The gallbladder wall is slightly thickened measuring 6 mm, likely partially related to underdistention. No pericholecystic fluid. Negative sonographic Murphy's sign. Common bile duct: Diameter: 6 mm Liver: No focal lesion identified. Within normal limits in parenchymal echogenicity. Portal vein is patent on color Doppler imaging with normal direction of blood flow towards the liver. Other: None. IMPRESSION: Gallbladder  sludge or small stones. No sonographic evidence of acute cholecystitis. Electronically Signed   By: Anner Crete M.D.   On: 05/02/2022 21:57   CT ABDOMEN PELVIS W CONTRAST  Result Date: 05/02/2022 CLINICAL DATA:  Pulmonary embolism suspected, high probability. Finished chemo in July for colon cancer. Chest pain and abdominal pain for 2 days. EXAM: CT ANGIOGRAPHY CHEST CT ABDOMEN AND PELVIS WITH CONTRAST TECHNIQUE: Multidetector CT imaging of the chest was performed using the standard protocol during bolus administration of intravenous contrast. Multiplanar CT image reconstructions and MIPs were obtained to evaluate the vascular anatomy. Multidetector CT imaging of the abdomen and pelvis was performed using the standard protocol during bolus administration of intravenous contrast. RADIATION DOSE REDUCTION: This exam was performed according to the departmental dose-optimization program which includes automated exposure control, adjustment of the mA and/or kV according to patient size and/or use of iterative reconstruction technique. CONTRAST:  113m OMNIPAQUE IOHEXOL 350 MG/ML SOLN COMPARISON:  04/03/2022. FINDINGS: CTA CHEST FINDINGS Cardiovascular: The heart is normal in size and there is no pericardial effusion. Scattered coronary artery calcifications are noted. The aorta and pulmonary trunk are normal in caliber. No definite evidence of pulmonary embolism. Examination in the mid to lower lung fields is limited due to respiratory motion and mixing artifact. Mediastinum/Nodes: No mediastinal, hilar, or axillary lymphadenopathy. The thyroid gland, trachea, and esophagus are stable. Lungs/Pleura: Lung volumes are low and strandy opacities are present at the lung bases, possible atelectasis or infiltrate. No effusion or pneumothorax. Musculoskeletal: Degenerative changes in the thoracic spine. No acute or suspicious osseous abnormality. Review of the MIP images confirms the above findings. CT ABDOMEN and PELVIS  FINDINGS Hepatobiliary: No focal liver abnormality is seen. Stones are present in the gallbladder. No biliary ductal dilatation. Pancreas: Unremarkable. No pancreatic ductal dilatation or surrounding inflammatory changes. Spleen: Normal in size without focal abnormality. Adrenals/Urinary Tract: The adrenal glands  are within normal limits. The kidneys enhance symmetrically. No renal calculus or hydronephrosis. The bladder is unremarkable. Stomach/Bowel: The stomach is within normal limits. Right hemicolectomy changes are noted. No bowel obstruction, free air, or pneumatosis. No focal bowel wall thickening or inflammatory changes. Vascular/Lymphatic: Aortic atherosclerosis. A few prominent lymph nodes are present in the mesentery in the right lower quadrant measuring up to 9 mm, increased in size from the prior exam. Reproductive: Prostate is unremarkable. Other: No abdominopelvic ascites. Musculoskeletal: No acute or suspicious osseous abnormality. Review of the MIP images confirms the above findings. IMPRESSION: 1. No definite evidence of pulmonary embolism. Evaluation of the pulmonary arteries in the mid to lower lung fields is limited due to mixing artifact and respiratory motion. 2. Low lung volumes with strandy atelectasis or infiltrate at the lung bases. 3. Nonspecific prominent lymph nodes are noted in the mesentery in the right lower quadrant, increased in size from the prior exam. Attention on follow-up is recommended. 4. Cholelithiasis. 5. Aortic atherosclerosis and coronary artery calcifications. Electronically Signed   By: Brett Fairy M.D.   On: 05/02/2022 20:48   CT Angio Chest Pulmonary Embolism (PE) W or WO Contrast  Result Date: 05/02/2022 CLINICAL DATA:  Pulmonary embolism suspected, high probability. Finished chemo in July for colon cancer. Chest pain and abdominal pain for 2 days. EXAM: CT ANGIOGRAPHY CHEST CT ABDOMEN AND PELVIS WITH CONTRAST TECHNIQUE: Multidetector CT imaging of the chest  was performed using the standard protocol during bolus administration of intravenous contrast. Multiplanar CT image reconstructions and MIPs were obtained to evaluate the vascular anatomy. Multidetector CT imaging of the abdomen and pelvis was performed using the standard protocol during bolus administration of intravenous contrast. RADIATION DOSE REDUCTION: This exam was performed according to the departmental dose-optimization program which includes automated exposure control, adjustment of the mA and/or kV according to patient size and/or use of iterative reconstruction technique. CONTRAST:  125m OMNIPAQUE IOHEXOL 350 MG/ML SOLN COMPARISON:  04/03/2022. FINDINGS: CTA CHEST FINDINGS Cardiovascular: The heart is normal in size and there is no pericardial effusion. Scattered coronary artery calcifications are noted. The aorta and pulmonary trunk are normal in caliber. No definite evidence of pulmonary embolism. Examination in the mid to lower lung fields is limited due to respiratory motion and mixing artifact. Mediastinum/Nodes: No mediastinal, hilar, or axillary lymphadenopathy. The thyroid gland, trachea, and esophagus are stable. Lungs/Pleura: Lung volumes are low and strandy opacities are present at the lung bases, possible atelectasis or infiltrate. No effusion or pneumothorax. Musculoskeletal: Degenerative changes in the thoracic spine. No acute or suspicious osseous abnormality. Review of the MIP images confirms the above findings. CT ABDOMEN and PELVIS FINDINGS Hepatobiliary: No focal liver abnormality is seen. Stones are present in the gallbladder. No biliary ductal dilatation. Pancreas: Unremarkable. No pancreatic ductal dilatation or surrounding inflammatory changes. Spleen: Normal in size without focal abnormality. Adrenals/Urinary Tract: The adrenal glands are within normal limits. The kidneys enhance symmetrically. No renal calculus or hydronephrosis. The bladder is unremarkable. Stomach/Bowel: The  stomach is within normal limits. Right hemicolectomy changes are noted. No bowel obstruction, free air, or pneumatosis. No focal bowel wall thickening or inflammatory changes. Vascular/Lymphatic: Aortic atherosclerosis. A few prominent lymph nodes are present in the mesentery in the right lower quadrant measuring up to 9 mm, increased in size from the prior exam. Reproductive: Prostate is unremarkable. Other: No abdominopelvic ascites. Musculoskeletal: No acute or suspicious osseous abnormality. Review of the MIP images confirms the above findings. IMPRESSION: 1. No definite evidence of  pulmonary embolism. Evaluation of the pulmonary arteries in the mid to lower lung fields is limited due to mixing artifact and respiratory motion. 2. Low lung volumes with strandy atelectasis or infiltrate at the lung bases. 3. Nonspecific prominent lymph nodes are noted in the mesentery in the right lower quadrant, increased in size from the prior exam. Attention on follow-up is recommended. 4. Cholelithiasis. 5. Aortic atherosclerosis and coronary artery calcifications. Electronically Signed   By: Brett Fairy M.D.   On: 05/02/2022 20:48    Anti-infectives: Anti-infectives (From admission, onward)    Start     Dose/Rate Route Frequency Ordered Stop   05/02/22 2230  piperacillin-tazobactam (ZOSYN) IVPB 3.375 g        3.375 g 100 mL/hr over 30 Minutes Intravenous  Once 05/02/22 2218 05/02/22 2338   05/02/22 2215  piperacillin-tazobactam (ZOSYN) IVPB 4.5 g  Status:  Discontinued        4.5 g 200 mL/hr over 30 Minutes Intravenous Once 05/02/22 2208 05/02/22 2218       Assessment/Plan: s/p * No surgery found * T bili continues to rise despite neg mrcp Will get HIDA today Continue bowel rest for now Continue IV abx  LOS: 1 day    Joseph Haney 05/04/2022

## 2022-05-05 DIAGNOSIS — K8020 Calculus of gallbladder without cholecystitis without obstruction: Secondary | ICD-10-CM | POA: Diagnosis not present

## 2022-05-05 DIAGNOSIS — C182 Malignant neoplasm of ascending colon: Secondary | ICD-10-CM | POA: Diagnosis not present

## 2022-05-05 DIAGNOSIS — R109 Unspecified abdominal pain: Secondary | ICD-10-CM | POA: Diagnosis not present

## 2022-05-05 DIAGNOSIS — M5432 Sciatica, left side: Secondary | ICD-10-CM | POA: Diagnosis not present

## 2022-05-05 LAB — COMPREHENSIVE METABOLIC PANEL
ALT: 151 U/L — ABNORMAL HIGH (ref 0–44)
AST: 93 U/L — ABNORMAL HIGH (ref 15–41)
Albumin: 3.4 g/dL — ABNORMAL LOW (ref 3.5–5.0)
Alkaline Phosphatase: 206 U/L — ABNORMAL HIGH (ref 38–126)
Anion gap: 7 (ref 5–15)
BUN: 7 mg/dL (ref 6–20)
CO2: 25 mmol/L (ref 22–32)
Calcium: 8.8 mg/dL — ABNORMAL LOW (ref 8.9–10.3)
Chloride: 107 mmol/L (ref 98–111)
Creatinine, Ser: 0.97 mg/dL (ref 0.61–1.24)
GFR, Estimated: >60 mL/min (ref >60)
Glucose, Bld: 115 mg/dL — ABNORMAL HIGH (ref 70–99)
Potassium: 3.6 mmol/L (ref 3.5–5.1)
Sodium: 139 mmol/L (ref 135–145)
Total Bilirubin: 2.3 mg/dL — ABNORMAL HIGH (ref 0.3–1.2)
Total Protein: 8.1 g/dL (ref 6.5–8.1)

## 2022-05-05 LAB — CBC
HCT: 35.5 % — ABNORMAL LOW (ref 39.0–52.0)
Hemoglobin: 11.5 g/dL — ABNORMAL LOW (ref 13.0–17.0)
MCH: 29.6 pg (ref 26.0–34.0)
MCHC: 32.4 g/dL (ref 30.0–36.0)
MCV: 91.3 fL (ref 80.0–100.0)
Platelets: 218 10*3/uL (ref 150–400)
RBC: 3.89 MIL/uL — ABNORMAL LOW (ref 4.22–5.81)
RDW: 12.7 % (ref 11.5–15.5)
WBC: 5.8 10*3/uL (ref 4.0–10.5)
nRBC: 0 % (ref 0.0–0.2)

## 2022-05-05 NOTE — Progress Notes (Signed)
[facility] Progress Note  Patient: Joseph Haney     BPZ:[id number]  DOA: 05/02/2022   PCP: Olga Coaster, [facility]       Brief hospital course: Is a 46 year old male with colon cancer status post partial colectomy, iron deficiency anemia, sciatica, and prior history of alcohol abuse.  He presents to the hospital for nausea and vomiting that started yesterday.  He is complaining of chest and abdominal pain.  He has abdominal pain present on the right side.  He states that he was seen by his PCP on August 16 and it was noted that his LFTs were elevated.  There was some further work-up planned but he was not having any symptoms at that time. He was also taking oxycodone about 3 times a day up until Monday but he had no GI symptoms until Friday.  CT of the abdomen and pelvis shows cholelithiasis. CTA of the chest was unrevealing. Ultrasound of the abdomen shows gallbladder sludge or small stones. MRCP reveals cholelithiasis with probable subtle gallbladder wall edema no choledocholithiasis or bile duct dilatation.  Subjective:   I let him have dinner yesterday as the HIDA could not be done and he had no pain. He has no other complaints.    Assessment and Plan: Principal Problem:   Cholelithiasis, ?  Mild cholecystitis-leukocytosis - Have asked for surgical opinion -cont Zosyn- leukocytosis and LFTs improved - general surgery feel he he likely has cholecystitis - waiting on a HIDA scan-  Active Problems:    Adenocarcinoma of ascending colon stage IIIc -Status post colectomy - completed Xeloda at the end of 6/23 - In remission at this point and restaging on 02/03/2022 revealed no evidence of recurrence or metastasis.  Hypertension - Continue carvedilol  Left leg pain - Sciatica documented in chart   DVT prophylaxis:  enoxaparin (LOVENOX) injection 40 mg Start: 05/03/22 1000   Code Status: Full Code  Consultants: General surgery Level of Care: Level of  care: Telemetry    Objective:   Vitals:   05/04/22 2122 05/05/22 0550 05/05/22 0858 05/05/22 1416  BP: 123/77 (!) 148/97 (!) 157/106 (!) 147/103  Pulse: 93 90  84  Resp: '16 16  18  '$ Temp: 98.9 F (37.2 C) 98.6 F (37 C)  98.4 F (36.9 C)  TempSrc: Oral Oral  Oral  SpO2: 97% 98%  97%  Weight:      Height:       Filed Weights   05/02/22 1817  Weight: 83.9 kg   Exam: General exam: Appears comfortable  HEENT: oral mucosa moist Respiratory system: Clear to auscultation.  Cardiovascular system: S1 & S2 heard  Gastrointestinal system: Abdomen soft, non-tender, nondistended. Normal bowel sounds   Extremities: No cyanosis, clubbing or edema Psychiatry:  Mood & affect appropriate.    Imaging and lab data was personally reviewed    CBC: Recent Labs  Lab 05/02/22 1839 05/03/22 0340 05/04/22 0612 05/05/22 0740  WBC 16.8* 15.4* 7.5 5.8  NEUTROABS 14.1*  --   --   --   HGB 13.4 12.7* 11.6* 11.5*  HCT 43.1 38.3* 35.1* 35.5*  MCV 97.3 90.8 91.2 91.3  PLT 236 235 205 242    Basic Metabolic Panel: Recent Labs  Lab 05/02/22 1839 05/03/22 0340 05/04/22 0612 05/05/22 0740  NA 140 139 137 139  K 3.7 3.8 3.3* 3.6  CL 106 106 105 107  CO2 '23 26 24 25  '$ GLUCOSE 180* 151* 114* 115*  BUN '13 10 9 '$ 7  CREATININE 0.92 0.80 0.83 0.97  CALCIUM 9.1 8.9 8.6* 8.8*    GFR: Estimated Creatinine Clearance: 97.5 mL/min (by C-G formula based on SCr of 0.97 mg/dL).  Scheduled Meds:  carvedilol  25 mg Oral BID WC   enoxaparin (LOVENOX) injection  40 mg Subcutaneous Q24H   multivitamin with minerals  1 tablet Oral Daily   Continuous Infusions:  dextrose 5% lactated ringers 75 mL/hr at 05/05/22 1511   piperacillin-tazobactam (ZOSYN)  IV 3.375 g (05/05/22 0902)     LOS: 2 days   Author: Debbe Odea  05/05/2022 4:09 PM

## 2022-05-05 NOTE — Progress Notes (Signed)
Subjective/Chief Complaint: No complaints. Feels good   Objective: Vital signs in last 24 hours: Temp:  [97.9 F (36.6 C)-98.9 F (37.2 C)] 98.6 F (37 C) (09/04 0550) Pulse Rate:  [82-93] 90 (09/04 0550) Resp:  [16-20] 16 (09/04 0550) BP: (123-148)/(77-97) 148/97 (09/04 0550) SpO2:  [95 %-98 %] 98 % (09/04 0550) Last BM Date : 05/04/22  Intake/Output from previous day: 09/03 0701 - 09/04 0700 In: 1302.2 [I.V.:1202.2; IV Piggyback:100] Out: -  Intake/Output this shift: No intake/output data recorded.  General appearance: alert and cooperative Resp: clear to auscultation bilaterally Cardio: regular rate and rhythm GI: soft, nontender  Lab Results:  Recent Labs    05/03/22 0340 05/04/22 0612  WBC 15.4* 7.5  HGB 12.7* 11.6*  HCT 38.3* 35.1*  PLT 235 205   BMET Recent Labs    05/03/22 0340 05/04/22 0612  NA 139 137  K 3.8 3.3*  CL 106 105  CO2 26 24  GLUCOSE 151* 114*  BUN 10 9  CREATININE 0.80 0.83  CALCIUM 8.9 8.6*   PT/INR No results for input(s): "LABPROT", "INR" in the last 72 hours. ABG No results for input(s): "PHART", "HCO3" in the last 72 hours.  Invalid input(s): "PCO2", "PO2"  Studies/Results: MR ABDOMEN MRCP W WO CONTAST  Result Date: 05/03/2022 CLINICAL DATA:  Abdominal pain.  History of colon cancer. EXAM: MRI ABDOMEN WITHOUT AND WITH CONTRAST (INCLUDING MRCP) TECHNIQUE: Multiplanar multisequence MR imaging of the abdomen was performed both before and after the administration of intravenous contrast. Heavily T2-weighted images of the biliary and pancreatic ducts were obtained, and three-dimensional MRCP images were rendered by post processing. CONTRAST:  13m GADAVIST GADOBUTROL 1 MMOL/ML IV SOLN COMPARISON:  Ultrasound exam and CT scan from earlier same day FINDINGS: Lower chest: Atelectasis noted right lung base. Hepatobiliary: No suspicious focal abnormality within the liver parenchyma. No focal restricted diffusion within the liver  parenchyma. Layering tiny gallstones evident. Subtle gallbladder wall edema noted without substantial gallbladder wall thickening. Common duct measures upper normal at approximately 6 mm diameter. Common bile duct in the head of the pancreas is 5-6 mm diameter. No choledocholithiasis. Pancreas: No focal mass lesion. No dilatation of the main duct. No intraparenchymal cyst. No peripancreatic edema. Spleen:  No splenomegaly. No focal mass lesion. Adrenals/Urinary Tract: No adrenal nodule or mass. Kidneys unremarkable. Stomach/Bowel: Stomach is unremarkable. No gastric wall thickening. No evidence of outlet obstruction. Duodenum is normally positioned as is the ligament of Treitz. No small bowel or colonic dilatation within the visualized abdomen. Vascular/Lymphatic: No abdominal aortic aneurysm. No abdominal lymphadenopathy. Other:  No intraperitoneal free fluid. Musculoskeletal: No focal suspicious marrow enhancement within the visualized bony anatomy. IMPRESSION: 1. Cholelithiasis with probable subtle gallbladder wall edema. No substantial gallbladder wall thickening. 2. No biliary dilatation.  No choledocholithiasis. Electronically Signed   By: EMisty StanleyM.D.   On: 05/03/2022 08:51   MR 3D Recon At Scanner  Result Date: 05/03/2022 CLINICAL DATA:  Abdominal pain.  History of colon cancer. EXAM: MRI ABDOMEN WITHOUT AND WITH CONTRAST (INCLUDING MRCP) TECHNIQUE: Multiplanar multisequence MR imaging of the abdomen was performed both before and after the administration of intravenous contrast. Heavily T2-weighted images of the biliary and pancreatic ducts were obtained, and three-dimensional MRCP images were rendered by post processing. CONTRAST:  862mGADAVIST GADOBUTROL 1 MMOL/ML IV SOLN COMPARISON:  Ultrasound exam and CT scan from earlier same day FINDINGS: Lower chest: Atelectasis noted right lung base. Hepatobiliary: No suspicious focal abnormality within the liver parenchyma. No  focal restricted diffusion  within the liver parenchyma. Layering tiny gallstones evident. Subtle gallbladder wall edema noted without substantial gallbladder wall thickening. Common duct measures upper normal at approximately 6 mm diameter. Common bile duct in the head of the pancreas is 5-6 mm diameter. No choledocholithiasis. Pancreas: No focal mass lesion. No dilatation of the main duct. No intraparenchymal cyst. No peripancreatic edema. Spleen:  No splenomegaly. No focal mass lesion. Adrenals/Urinary Tract: No adrenal nodule or mass. Kidneys unremarkable. Stomach/Bowel: Stomach is unremarkable. No gastric wall thickening. No evidence of outlet obstruction. Duodenum is normally positioned as is the ligament of Treitz. No small bowel or colonic dilatation within the visualized abdomen. Vascular/Lymphatic: No abdominal aortic aneurysm. No abdominal lymphadenopathy. Other:  No intraperitoneal free fluid. Musculoskeletal: No focal suspicious marrow enhancement within the visualized bony anatomy. IMPRESSION: 1. Cholelithiasis with probable subtle gallbladder wall edema. No substantial gallbladder wall thickening. 2. No biliary dilatation.  No choledocholithiasis. Electronically Signed   By: Misty Stanley M.D.   On: 05/03/2022 08:51    Anti-infectives: Anti-infectives (From admission, onward)    Start     Dose/Rate Route Frequency Ordered Stop   05/04/22 0845  piperacillin-tazobactam (ZOSYN) IVPB 3.375 g        3.375 g 12.5 mL/hr over 240 Minutes Intravenous Every 8 hours 05/04/22 0745     05/02/22 2230  piperacillin-tazobactam (ZOSYN) IVPB 3.375 g        3.375 g 100 mL/hr over 30 Minutes Intravenous  Once 05/02/22 2218 05/02/22 2338   05/02/22 2215  piperacillin-tazobactam (ZOSYN) IVPB 4.5 g  Status:  Discontinued        4.5 g 200 mL/hr over 30 Minutes Intravenous Once 05/02/22 2208 05/02/22 2218       Assessment/Plan: s/p * No surgery found * Recheck lft 's Gallstones with no abd pain HIDA pending If neg then advance  diet  LOS: 2 days    Autumn Messing III 05/05/2022

## 2022-05-06 ENCOUNTER — Inpatient Hospital Stay (HOSPITAL_COMMUNITY): Payer: Medicaid Other

## 2022-05-06 DIAGNOSIS — C182 Malignant neoplasm of ascending colon: Secondary | ICD-10-CM | POA: Diagnosis not present

## 2022-05-06 DIAGNOSIS — D72829 Elevated white blood cell count, unspecified: Secondary | ICD-10-CM | POA: Diagnosis not present

## 2022-05-06 DIAGNOSIS — R109 Unspecified abdominal pain: Secondary | ICD-10-CM | POA: Diagnosis not present

## 2022-05-06 DIAGNOSIS — K7689 Other specified diseases of liver: Secondary | ICD-10-CM | POA: Diagnosis not present

## 2022-05-06 DIAGNOSIS — M5432 Sciatica, left side: Secondary | ICD-10-CM | POA: Diagnosis not present

## 2022-05-06 DIAGNOSIS — K8020 Calculus of gallbladder without cholecystitis without obstruction: Secondary | ICD-10-CM | POA: Diagnosis not present

## 2022-05-06 DIAGNOSIS — K8010 Calculus of gallbladder with chronic cholecystitis without obstruction: Secondary | ICD-10-CM | POA: Diagnosis not present

## 2022-05-06 MED ORDER — TECHNETIUM TC 99M MEBROFENIN IV KIT
7.5000 | PACK | Freq: Once | INTRAVENOUS | Status: AC
  Administered 2022-05-06: 7.5 via INTRAVENOUS

## 2022-05-06 NOTE — Progress Notes (Signed)
[facility] Progress Note  Patient: Joseph Haney     YPP:[id number]  DOA: 05/02/2022   PCP: Olga Coaster, [facility]       Brief hospital course: Is a 46 year old male with colon cancer status post partial colectomy, iron deficiency anemia, sciatica, and prior history of alcohol abuse.  He presents to the hospital for nausea and vomiting that started yesterday.  He is complaining of chest and abdominal pain.  He has abdominal pain present on the right side.  He states that he was seen by his PCP on August 16 and it was noted that his LFTs were elevated.  There was some further work-up planned but he was not having any symptoms at that time. He was also taking oxycodone about 3 times a day up until Monday but he had no GI symptoms until Friday.  CT of the abdomen and pelvis shows cholelithiasis. CTA of the chest was unrevealing. Ultrasound of the abdomen shows gallbladder sludge or small stones. MRCP reveals cholelithiasis with probable subtle gallbladder wall edema no choledocholithiasis or bile duct dilatation.  Subjective:  He has no complaints today. Was waiting on HIDA when I saw him.  Assessment and Plan: Principal Problem:   Cholelithiasis, ?  Mild cholecystitis-leukocytosis - Have asked for surgical opinion -cont Zosyn- leukocytosis and LFTs improved - general surgery feel he he likely has cholecystitis - Reviewed HIDA scan and spoke with Dr Harlow Asa who recommends starting diet and following LFTs tomorrow- he will arrange f/u with surgery as outpt  Active Problems:    Adenocarcinoma of ascending colon stage IIIc -Status post colectomy - completed Xeloda at the end of 6/23 - In remission at this point and restaging on 02/03/2022 revealed no evidence of recurrence or metastasis.  Hypertension - Continue carvedilol  Left leg pain - Sciatica documented in chart   DVT prophylaxis:  enoxaparin (LOVENOX) injection 40 mg Start: 05/03/22 1000   Code  Status: Full Code  Consultants: General surgery Level of Care: Level of care: Telemetry    Objective:   Vitals:   05/05/22 1621 05/05/22 2213 05/06/22 0551 05/06/22 1408  BP: (!) 151/100 (!) 149/103 (!) 156/103 (!) 142/101  Pulse: 78 83 82 78  Resp:  '20 18 16  '$ Temp:  98.4 F (36.9 C) 98.5 F (36.9 C) 98.6 F (37 C)  TempSrc:  Oral Oral Oral  SpO2:  99% 96% 97%  Weight:      Height:       Filed Weights   05/02/22 1817  Weight: 83.9 kg   Exam: General exam: Appears comfortable  HEENT: oral mucosa moist Respiratory system: Clear to auscultation.  Cardiovascular system: S1 & S2 heard  Gastrointestinal system: Abdomen soft, non-tender, nondistended. Normal bowel sounds   Extremities: No cyanosis, clubbing or edema Psychiatry:  Mood & affect appropriate.    Imaging and lab data was personally reviewed    CBC: Recent Labs  Lab 05/02/22 1839 05/03/22 0340 05/04/22 0612 05/05/22 0740  WBC 16.8* 15.4* 7.5 5.8  NEUTROABS 14.1*  --   --   --   HGB 13.4 12.7* 11.6* 11.5*  HCT 43.1 38.3* 35.1* 35.5*  MCV 97.3 90.8 91.2 91.3  PLT 236 235 205 458    Basic Metabolic Panel: Recent Labs  Lab 05/02/22 1839 05/03/22 0340 05/04/22 0612 05/05/22 0740  NA 140 139 137 139  K 3.7 3.8 3.3* 3.6  CL 106 106 105 107  CO2 '23 26 24 25  '$ GLUCOSE 180* 151* 114* 115*  BUN '13 10 9 7  '$ CREATININE 0.92 0.80 0.83 0.97  CALCIUM 9.1 8.9 8.6* 8.8*    GFR: Estimated Creatinine Clearance: 97.5 mL/min (by C-G formula based on SCr of 0.97 mg/dL).  Scheduled Meds:  carvedilol  25 mg Oral BID WC   enoxaparin (LOVENOX) injection  40 mg Subcutaneous Q24H   multivitamin with minerals  1 tablet Oral Daily   Continuous Infusions:  dextrose 5% lactated ringers 75 mL/hr at 05/05/22 1511   piperacillin-tazobactam (ZOSYN)  IV 3.375 g (05/06/22 1712)     LOS: 3 days   Author: Debbe Odea  05/06/2022 5:56 PM

## 2022-05-06 NOTE — Progress Notes (Signed)
[facility] Progress Note     Subjective: CC-  Up walking around room this morning. Denies abdominal pain, n/v. Tolerated diet yesterday without worsening pain.  Objective: Vital signs in last 24 hours: Temp:  [98.4 F (36.9 C)-98.5 F (36.9 C)] 98.5 F (36.9 C) (09/05 0551) Pulse Rate:  [78-84] 82 (09/05 0551) Resp:  [18-20] 18 (09/05 0551) BP: (147-157)/(100-106) 156/103 (09/05 0551) SpO2:  [96 %-99 %] 96 % (09/05 0551) Last BM Date : 05/04/22  Intake/Output from previous day: No intake/output data recorded. Intake/Output this shift: No intake/output data recorded.  PE: Gen:  Alert, NAD, pleasant Abd: soft, ND, NT  Lab Results:  Recent Labs    05/04/22 0612 05/05/22 0740  WBC 7.5 5.8  HGB 11.6* 11.5*  HCT 35.1* 35.5*  PLT 205 218   BMET Recent Labs    05/04/22 0612 05/05/22 0740  NA 137 139  K 3.3* 3.6  CL 105 107  CO2 24 25  GLUCOSE 114* 115*  BUN 9 7  CREATININE 0.83 0.97  CALCIUM 8.6* 8.8*   PT/INR No results for input(s): "LABPROT", "INR" in the last 72 hours. CMP     Component Value Date/Time   NA 139 05/05/2022 0740   K 3.6 05/05/2022 0740   CL 107 05/05/2022 0740   CO2 25 05/05/2022 0740   GLUCOSE 115 (H) 05/05/2022 0740   BUN 7 05/05/2022 0740   CREATININE 0.97 05/05/2022 0740   CREATININE 0.93 03/21/2022 1352   CREATININE 0.62 10/21/2012 1530   CALCIUM 8.8 (L) 05/05/2022 0740   PROT 8.1 05/05/2022 0740   ALBUMIN 3.4 (L) 05/05/2022 0740   AST 93 (H) 05/05/2022 0740   AST 16 03/21/2022 1352   ALT 151 (H) 05/05/2022 0740   ALT 16 03/21/2022 1352   ALKPHOS 206 (H) 05/05/2022 0740   BILITOT 2.3 (H) 05/05/2022 0740   BILITOT 0.5 03/21/2022 1352   GFRNONAA >60 05/05/2022 0740   GFRNONAA >60 03/21/2022 1352   GFRNONAA >89 10/21/2012 1530   GFRAA >60 11/28/2015 0501   GFRAA >89 10/21/2012 1530   Lipase     Component Value Date/Time   LIPASE 62 (H) 05/02/2022 1839       Studies/Results: No results  found.  Anti-infectives: Anti-infectives (From admission, onward)    Start     Dose/Rate Route Frequency Ordered Stop   05/04/22 0845  piperacillin-tazobactam (ZOSYN) IVPB 3.375 g        3.375 g 12.5 mL/hr over 240 Minutes Intravenous Every 8 hours 05/04/22 0745     05/02/22 2230  piperacillin-tazobactam (ZOSYN) IVPB 3.375 g        3.375 g 100 mL/hr over 30 Minutes Intravenous  Once 05/02/22 2218 05/02/22 2338   05/02/22 2215  piperacillin-tazobactam (ZOSYN) IVPB 4.5 g  Status:  Discontinued        4.5 g 200 mL/hr over 30 Minutes Intravenous Once 05/02/22 2208 05/02/22 2218        Assessment/Plan Gallstones Elevated LFTs - MRCP negative for choledocholithiasis  - HIDA pending - Yesterday WBC WNL and LFTs continued to trend down. Patient has clinically improved. Further recommendations to come following HIDA.  ID - zosyn FEN - IVF, NPO VTE - lovenox Foley - none  Adenocarcinoma of ascending colon stage IIIc s/p right colectomy 07/23/21 Dr. Rosendo Gros - completed chemo mid July HTN  I reviewed hospitalist notes, last 24 h vitals and pain scores, last 48 h intake and output, last 24 h labs and trends, and last 24  h imaging results.    LOS: 3 days    [facility] 05/06/2022, 8:31 AM Please see Amion for pager number during day hours 7:00am-4:30pm

## 2022-05-06 NOTE — TOC Transition Note (Signed)
Transition of Care [facility]) - CM/SW Discharge Note   Patient Details  Name: Joseph Haney MRN: [id number] Date of Birth: 04-24-1976  [facility] CM/SW Contact:  Illene Regulus, LCSW Phone Number: 05/06/2022, 10:54 AM   Clinical Narrative:    CSW spoke with pt to offer substance abuse resources. Pt stated " I don't know why I would need that". Pt has declined substance resources .No additional needs TOC will sign off .      Barriers to Discharge: Continued Medical Work up   Patient Goals and CMS Choice Patient states their goals for this hospitalization and ongoing recovery are:: return home      Discharge Placement                       Discharge Plan and Services                                     Social Determinants of Health (SDOH) Interventions     Readmission Risk Interventions     No data to display

## 2022-05-07 DIAGNOSIS — K8020 Calculus of gallbladder without cholecystitis without obstruction: Secondary | ICD-10-CM | POA: Diagnosis not present

## 2022-05-07 LAB — COMPREHENSIVE METABOLIC PANEL
ALT: 94 U/L — ABNORMAL HIGH (ref 0–44)
AST: 41 U/L (ref 15–41)
Albumin: 3.5 g/dL (ref 3.5–5.0)
Alkaline Phosphatase: 186 U/L — ABNORMAL HIGH (ref 38–126)
Anion gap: 7 (ref 5–15)
BUN: 8 mg/dL (ref 6–20)
CO2: 25 mmol/L (ref 22–32)
Calcium: 8.7 mg/dL — ABNORMAL LOW (ref 8.9–10.3)
Chloride: 109 mmol/L (ref 98–111)
Creatinine, Ser: 0.88 mg/dL (ref 0.61–1.24)
GFR, Estimated: >60 mL/min (ref >60)
Glucose, Bld: 103 mg/dL — ABNORMAL HIGH (ref 70–99)
Potassium: 3.2 mmol/L — ABNORMAL LOW (ref 3.5–5.1)
Sodium: 141 mmol/L (ref 135–145)
Total Bilirubin: 1.1 mg/dL (ref 0.3–1.2)
Total Protein: 8 g/dL (ref 6.5–8.1)

## 2022-05-07 LAB — CBC
HCT: 38.8 % — ABNORMAL LOW (ref 39.0–52.0)
Hemoglobin: 12.7 g/dL — ABNORMAL LOW (ref 13.0–17.0)
MCH: 29.8 pg (ref 26.0–34.0)
MCHC: 32.7 g/dL (ref 30.0–36.0)
MCV: 91.1 fL (ref 80.0–100.0)
Platelets: 269 10*3/uL (ref 150–400)
RBC: 4.26 MIL/uL (ref 4.22–5.81)
RDW: 12.8 % (ref 11.5–15.5)
WBC: 7.4 10*3/uL (ref 4.0–10.5)
nRBC: 0 % (ref 0.0–0.2)

## 2022-05-07 MED ORDER — POTASSIUM CHLORIDE CRYS ER 20 MEQ PO TBCR
20.0000 meq | EXTENDED_RELEASE_TABLET | Freq: Once | ORAL | Status: DC
Start: 2022-05-07 — End: 2022-05-07

## 2022-05-07 MED ORDER — POTASSIUM CHLORIDE CRYS ER 20 MEQ PO TBCR: 20.0000 meq | EXTENDED_RELEASE_TABLET | Freq: Every day | ORAL | 0 refills | Status: DC

## 2022-05-07 MED ORDER — POTASSIUM CHLORIDE 10 MEQ/100ML IV SOLN
10.0000 meq | INTRAVENOUS | Status: DC
  Administered 2022-05-07: 10 meq via INTRAVENOUS
  Filled 2022-05-07: qty 100

## 2022-05-07 MED ORDER — POTASSIUM CHLORIDE CRYS ER 20 MEQ PO TBCR
40.0000 meq | EXTENDED_RELEASE_TABLET | Freq: Once | ORAL | Status: AC
Start: 2022-05-07 — End: 2022-05-07
  Administered 2022-05-07: 40 meq via ORAL
  Filled 2022-05-07: qty 2

## 2022-05-07 NOTE — Progress Notes (Signed)
[facility] Progress Note     Subjective: CC-  No complaints. Tolerating diet without abdominal pain, n/v. LFTs continue to trend down. WBC WNL.  Objective: Vital signs in last 24 hours: Temp:  [97.9 F (36.6 C)-98.6 F (37 C)] 97.9 F (36.6 C) (09/06 0404) Pulse Rate:  [78-81] 81 (09/06 0404) Resp:  [16-18] 18 (09/06 0404) BP: (142-154)/(101-105) 153/105 (09/06 0404) SpO2:  [97 %-98 %] 98 % (09/06 0404) Last BM Date : 05/04/22  Intake/Output from previous day: 09/05 0701 - 09/06 0700 In: 120 [P.O.:120] Out: -  Intake/Output this shift: No intake/output data recorded.  PE: Gen:  Alert, NAD, pleasant Abd: soft, ND, NT  Lab Results:  Recent Labs    05/05/22 0740 05/07/22 0608  WBC 5.8 7.4  HGB 11.5* 12.7*  HCT 35.5* 38.8*  PLT 218 269   BMET Recent Labs    05/05/22 0740 05/07/22 0608  NA 139 141  K 3.6 3.2*  CL 107 109  CO2 25 25  GLUCOSE 115* 103*  BUN 7 8  CREATININE 0.97 0.88  CALCIUM 8.8* 8.7*   PT/INR No results for input(s): "LABPROT", "INR" in the last 72 hours. CMP     Component Value Date/Time   NA 141 05/07/2022 0608   K 3.2 (L) 05/07/2022 0608   CL 109 05/07/2022 0608   CO2 25 05/07/2022 0608   GLUCOSE 103 (H) 05/07/2022 0608   BUN 8 05/07/2022 0608   CREATININE 0.88 05/07/2022 0608   CREATININE 0.93 03/21/2022 1352   CREATININE 0.62 10/21/2012 1530   CALCIUM 8.7 (L) 05/07/2022 0608   PROT 8.0 05/07/2022 0608   ALBUMIN 3.5 05/07/2022 0608   AST 41 05/07/2022 0608   AST 16 03/21/2022 1352   ALT 94 (H) 05/07/2022 0608   ALT 16 03/21/2022 1352   ALKPHOS 186 (H) 05/07/2022 0608   BILITOT 1.1 05/07/2022 0608   BILITOT 0.5 03/21/2022 1352   GFRNONAA >60 05/07/2022 0608   GFRNONAA >60 03/21/2022 1352   GFRNONAA >89 10/21/2012 1530   GFRAA >60 11/28/2015 0501   GFRAA >89 10/21/2012 1530   Lipase     Component Value Date/Time   LIPASE 62 (H) 05/02/2022 1839       Studies/Results: NM Hepato W/EF  Result Date:  05/06/2022 CLINICAL DATA:  Cholelithiasis on ultrasound MRI. Recent elevated white blood cell count which normalize. Persistent elevated bilirubin. No biliary obstruction identified on comparison MRI. EXAM: NUCLEAR MEDICINE HEPATOBILIARY IMAGING TECHNIQUE: Sequential images of the abdomen were obtained out to 60 minutes following intravenous administration of radiopharmaceutical. RADIOPHARMACEUTICALS:  7.5 mCi Tc-58m Choletec IV COMPARISON:  MRI 05/03/2022, ultrasound 05/02/2022 FINDINGS: Prompt clearance radiotracer from blood pool and homogeneous uptake in liver. Counts are evident in the common bile duct and small bowel by 30 minutes. Gallbladder failed to fill after 2 hours of imaging. Minimal counts remain within liver parenchyma at the 2 hour time point. Patient was unable to receive IV morphine due to morphine allergy. No morphine augmentation for gallbladder filling. IMPRESSION: Non filling of the gallbladder at 2 hours. Differential includes obstruction of cystic duct versus delayed gallbladder filling which could be normal variation or evidence of chronic cholecystitis. Patent common bile duct. These results will be called to the ordering clinician or representative by the Radiologist Assistant, and communication documented in the PACS or [employer] Electronically Signed   By: SSuzy BouchardM.D.   On: 05/06/2022 13:53    Anti-infectives: Anti-infectives (From admission, onward)    Start  Dose/Rate Route Frequency Ordered Stop   05/04/22 0845  piperacillin-tazobactam (ZOSYN) IVPB 3.375 g        3.375 g 12.5 mL/hr over 240 Minutes Intravenous Every 8 hours 05/04/22 0745     05/02/22 2230  piperacillin-tazobactam (ZOSYN) IVPB 3.375 g        3.375 g 100 mL/hr over 30 Minutes Intravenous  Once 05/02/22 2218 05/02/22 2338   05/02/22 2215  piperacillin-tazobactam (ZOSYN) IVPB 4.5 g  Status:  Discontinued        4.5 g 200 mL/hr over 30 Minutes Intravenous Once 05/02/22 2208 05/02/22 2218         Assessment/Plan Gallstones Elevated LFTs - MRCP negative for choledocholithiasis  - HIDA with non-filling of the gallbladder (obstruction of cystic duct versus delayed gallbladder filling which could be normal variation or evidence of chronic cholecystitis) - Patient is tolerating a diet and abdominal symptoms have resolved. LFTs continue to trend down. [place] for discharge today from surgical standpoint. I will arrange follow up in our office to discuss cholecystectomy.   ID - zosyn FEN - IVF, reg VTE - lovenox Foley - none   Adenocarcinoma of ascending colon stage IIIc s/p right colectomy 07/23/21 Dr. Rosendo Gros - completed chemo mid July HTN   I reviewed hospitalist notes, last 24 h vitals and pain scores, last 48 h intake and output, last 24 h labs and trends, and last 24 h imaging results.    LOS: 4 days    [facility] 05/07/2022, 8:18 AM Please see Amion for pager number during day hours 7:00am-4:30pm

## 2022-05-07 NOTE — Discharge Summary (Signed)
Physician Discharge Summary  Joseph Haney NAT:[id number] DOB: 04/19/1976 DOA: 05/02/2022  PCP: [facility] [place] date: 05/02/2022 Discharge date: 05/07/2022 Recommendations for Outpatient Follow-up:  Follow up with PCP in 1 weeks-call for appointment Please obtain BMP/CBC in one week Follow-up with your surgeon Dr Rosendo Gros to discuss cholecystectomy  Discharge Dispo: home Discharge Condition: Stable Code Status:   Code Status: Full Code Diet recommendation:  Diet Order             Diet regular Room service appropriate? Yes; Fluid consistency: Thin  Diet effective now                    Brief/Interim Summary: 46 year old male with colon cancer status post partial colectomy, iron deficiency anemia, sciatica, and prior history of alcohol abuse presented to the hospital for nausea and vomiting, seen by his PCP on August 16 and it was noted that his LFTs were elevated. There was some further work-up planned but he was not having any symptoms at that time.CT of the abdomen and pelvis shows cholelithiasis. CTA of the chest was unrevealing.Ultrasound of the abdomen shows gallbladder sludge or small stones. MRCP reveals cholelithiasis with probable subtle gallbladder wall edema no choledocholithiasis or bile duct dilatation. Diet was advanced tolerating diet no more abdominal pain nausea or vomiting, LFTs have normalized.Surgery advised discharge home and they will arrange for outpatient follow-up with Dr Rosendo Gros to discuss cholecystectomy.  Discharge Diagnoses:  Principal Problem:   Cholelithiasis Active Problems:   Cancer of right colon ([place])   Sciatica  Cholelithiasis,? Mild cholecystitis-leukocytosis- treated w/ Zosyn- leukocytosis and LFTs improved.HIDA with non-filling of the gallbladder (obstruction of cystic duct versus delayed gallbladder filling which could be normal variation or evidence of chronic cholecystitis).  Diet was advanced tolerating diet no  more abdominal pain nausea or vomiting, LFTs have normalized.Surgery advised discharge home and they will arrange for outpatient follow-up with Dr Rosendo Gros to discuss cholecystectomy.  Discussed with surgical team PA Brooke who advised no further need for antibiotics upon discharge from surgical standpoint.      Adenocarcinoma of ascending colon stage IIIc Status post colectomy, completed Xeloda at the end of 6/23- In remission at this point and restaging on 02/03/2022 revealed no evidence of recurrence or metastasis.   Hypertension: Controlled on Coreg Left leg pain:Sciatica documented in chart Overweight with BMI 28.9.  Consults: General surgery Subjective: Alert, awake oriented, no more abdomen pain nausea vomiting.  Tolerating diet.  He feels ready for discharge home today.  Discharge Exam: Vitals:   05/07/22 0404 05/07/22 0857  BP: (!) 153/105 135/87  Pulse: 81 82  Resp: 18 18  Temp: 97.9 F (36.6 C) 98.4 F (36.9 C)  SpO2: 98% 97%   General: Pt is alert, awake, not in acute distress Cardiovascular: RRR, S1/S2 +, no rubs, no gallops Respiratory: CTA bilaterally, no wheezing, no rhonchi Abdominal: Soft, NT, ND, bowel sounds + Extremities: no edema, no cyanosis  Discharge Instructions  Discharge Instructions     Discharge instructions   Complete by: As directed    Please call call MD or return to ER for similar or worsening recurring problem that brought you to hospital or if any fever,nausea/vomiting,abdominal pain, uncontrolled pain, chest pain,  shortness of breath or any other alarming symptoms.  Please follow-up your Dr Rosendo Gros discuss about cholecystectomy as scheduled . Call the office for appointment.  Please avoid alcohol, smoking, or any other illicit substance and maintain healthy habits including taking your regular  medications as prescribed.  You were cared for by a hospitalist during your hospital stay. If you have any questions about your discharge medications  or the care you received while you were in the hospital after you are discharged, you can call the unit and ask to speak with the hospitalist on call if the hospitalist that took care of you is not available.  Once you are discharged, your primary care physician will handle any further medical issues. Please note that NO REFILLS for any discharge medications will be authorized once you are discharged, as it is imperative that you return to your primary care physician (or establish a relationship with a primary care physician if you do not have one) for your aftercare needs so that they can reassess your need for medications and monitor your lab values   Increase activity slowly   Complete by: As directed       Allergies as of 05/07/2022       Reactions   Morphine And Related Itching        Medication List     TAKE these medications    carvedilol 25 MG tablet Commonly known as: COREG Take 1 tablet (25 mg total) by mouth 2 (two) times daily with a meal.   cyclobenzaprine 10 MG tablet Commonly known as: FLEXERIL Take 10-20 mg by mouth 2 (two) times daily as needed for muscle spasms.   dexamethasone 4 MG tablet Commonly known as: DECADRON Take 1 tablet (4 mg total) by mouth daily. Take for 3-5 days after chemo for nausea and fatigue   docusate sodium 100 MG capsule Commonly known as: COLACE Take 100 mg by mouth daily.   escitalopram 10 MG tablet Commonly known as: LEXAPRO Take 10 mg by mouth daily.   ferrous sulfate 325 (65 FE) MG tablet Take 1 tablet (325 mg total) by mouth 2 (two) times daily with a meal.   ondansetron 4 MG tablet Commonly known as: Zofran Take 1 tablet (4 mg total) by mouth every 8 (eight) hours as needed for nausea or vomiting.   oxyCODONE 5 MG immediate release tablet Commonly known as: Roxicodone Take 1 tablet (5 mg total) by mouth every 6 (six) hours as needed for severe pain.   polyethylene glycol 17 g packet Commonly known as: MIRALAX /  GLYCOLAX Take 17 g by mouth daily as needed for mild constipation or moderate constipation.   potassium chloride SA 20 MEQ tablet Commonly known as: KLOR-CON M Take 1 tablet (20 mEq total) by mouth daily for 5 days.   prochlorperazine 10 MG tablet Commonly known as: COMPAZINE Take 1 tablet (10 mg total) by mouth every 6 (six) hours as needed (Nausea or vomiting).   promethazine 25 MG tablet Commonly known as: PHENERGAN Take 1 tablet (25 mg total) by mouth every 6 (six) hours as needed for nausea or vomiting.   triamcinolone cream 0.1 % Commonly known as: KENALOG Apply 1 Application topically daily as needed (for rash and itching).        Follow-up Information     [facility] Follow up in 1 week(s).   Specialty: Family Medicine Contact information: [place] [address] [phone]         Ralene Ok, MD. Go on 06/06/2022.   Specialty: General Surgery Why: Your appointment is 06/06/22 at 9:40am Please arrive 15 minutes prior to your appointment to check in. Contact information: [address] [place]  [address] [phone]                Allergies  Allergen Reactions   Morphine And Related Itching    The results of significant diagnostics from this hospitalization (including imaging, microbiology, ancillary and laboratory) are listed below for reference.    Microbiology: Recent Results (from the past 240 hour(s))  Blood culture (routine x 2)     Status: None (Preliminary result)   Collection Time: 05/02/22 10:46 PM   Specimen: BLOOD  Result Value Ref Range Status   Specimen Description   Final    BLOOD BLOOD LEFT ARM Performed at [facility] [address] [place], [place] [address]    Special Requests   Final    Blood Culture results may not be optimal due to an excessive volume of blood received in culture bottles Blood Culture adequate volume Performed  at [facility] [address], [place], [place] [address]    Culture   Final    NO GROWTH 4 DAYS Performed at [facility], [place] [address] [place], [place] [address]    Report Status PENDING  Incomplete  Blood culture (routine x 2)     Status: None (Preliminary result)   Collection Time: 05/02/22 11:00 PM   Specimen: BLOOD  Result Value Ref Range Status   Specimen Description   Final    BLOOD BLOOD LEFT FOREARM Performed at [facility] [address], [place], [place] [address]    Special Requests   Final    BOTTLES DRAWN AEROBIC AND ANAEROBIC Blood Culture adequate volume Performed at [name] [address], [place], [place] [address]    Culture   Final    NO GROWTH 4 DAYS Performed at [facility], [place] [address] [place],  [address]    Report Status PENDING  Incomplete    Procedures/Studies: NM Hepato W/EF  Result Date: 05/06/2022 CLINICAL DATA:  Cholelithiasis on ultrasound MRI. Recent elevated white blood cell count which normalize. Persistent elevated bilirubin. No biliary obstruction identified on comparison MRI. EXAM: NUCLEAR MEDICINE HEPATOBILIARY IMAGING TECHNIQUE: Sequential images of the abdomen were obtained out to 60 minutes following intravenous administration of radiopharmaceutical. RADIOPHARMACEUTICALS:  7.5 mCi Tc-62m Choletec IV COMPARISON:  MRI 05/03/2022, ultrasound 05/02/2022 FINDINGS: Prompt clearance radiotracer from blood pool and homogeneous uptake in liver. Counts are evident in the common bile duct and small bowel by 30 minutes. Gallbladder failed to fill after 2 hours of imaging. Minimal counts remain within liver parenchyma at the 2 hour time point. Patient was unable to receive IV morphine due to morphine allergy. No morphine augmentation for gallbladder filling. IMPRESSION: Non filling of the gallbladder at 2 hours. Differential includes obstruction of cystic duct versus  delayed gallbladder filling which could be normal variation or evidence of chronic cholecystitis. Patent common bile duct. These results will be called to the ordering clinician or representative by the Radiologist Assistant, and communication documented in the PACS or [employer] Electronically Signed   By: SSuzy BouchardM.D.   On: 05/06/2022 13:53   MR ABDOMEN MRCP W WO CONTAST  Result Date: 05/03/2022 CLINICAL DATA:  Abdominal pain.  History of colon cancer. EXAM: MRI ABDOMEN WITHOUT AND WITH CONTRAST (INCLUDING MRCP) TECHNIQUE: Multiplanar multisequence MR imaging of the abdomen was performed both before and after the administration of intravenous contrast. Heavily T2-weighted images of the biliary and pancreatic ducts were obtained, and three-dimensional MRCP images were  rendered by post processing. CONTRAST:  52m GADAVIST GADOBUTROL 1 MMOL/ML IV SOLN COMPARISON:  Ultrasound exam and CT scan from earlier same day FINDINGS: Lower chest: Atelectasis noted right lung base. Hepatobiliary: No suspicious focal abnormality within the liver parenchyma. No focal restricted diffusion within the liver parenchyma. Layering tiny gallstones evident. Subtle gallbladder wall edema noted without substantial gallbladder wall thickening. Common duct measures upper normal at approximately 6 mm diameter. Common bile duct in the head of the pancreas is 5-6 mm diameter. No choledocholithiasis. Pancreas: No focal mass lesion. No dilatation of the main duct. No intraparenchymal cyst. No peripancreatic edema. Spleen:  No splenomegaly. No focal mass lesion. Adrenals/Urinary Tract: No adrenal nodule or mass. Kidneys unremarkable. Stomach/Bowel: Stomach is unremarkable. No gastric wall thickening. No evidence of outlet obstruction. Duodenum is normally positioned as is the ligament of Treitz. No small bowel or colonic dilatation within the visualized abdomen. Vascular/Lymphatic: No abdominal aortic aneurysm. No abdominal  lymphadenopathy. Other:  No intraperitoneal free fluid. Musculoskeletal: No focal suspicious marrow enhancement within the visualized bony anatomy. IMPRESSION: 1. Cholelithiasis with probable subtle gallbladder wall edema. No substantial gallbladder wall thickening. 2. No biliary dilatation.  No choledocholithiasis. Electronically Signed   By: EMisty StanleyM.D.   On: 05/03/2022 08:51   MR 3D Recon At Scanner  Result Date: 05/03/2022 CLINICAL DATA:  Abdominal pain.  History of colon cancer. EXAM: MRI ABDOMEN WITHOUT AND WITH CONTRAST (INCLUDING MRCP) TECHNIQUE: Multiplanar multisequence MR imaging of the abdomen was performed both before and after the administration of intravenous contrast. Heavily T2-weighted images of the biliary and pancreatic ducts were obtained, and three-dimensional MRCP images were rendered by post processing. CONTRAST:  874mGADAVIST GADOBUTROL 1 MMOL/ML IV SOLN COMPARISON:  Ultrasound exam and CT scan from earlier same day FINDINGS: Lower chest: Atelectasis noted right lung base. Hepatobiliary: No suspicious focal abnormality within the liver parenchyma. No focal restricted diffusion within the liver parenchyma. Layering tiny gallstones evident. Subtle gallbladder wall edema noted without substantial gallbladder wall thickening. Common duct measures upper normal at approximately 6 mm diameter. Common bile duct in the head of the pancreas is 5-6 mm diameter. No choledocholithiasis. Pancreas: No focal mass lesion. No dilatation of the main duct. No intraparenchymal cyst. No peripancreatic edema. Spleen:  No splenomegaly. No focal mass lesion. Adrenals/Urinary Tract: No adrenal nodule or mass. Kidneys unremarkable. Stomach/Bowel: Stomach is unremarkable. No gastric wall thickening. No evidence of outlet obstruction. Duodenum is normally positioned as is the ligament of Treitz. No small bowel or colonic dilatation within the visualized abdomen. Vascular/Lymphatic: No abdominal aortic  aneurysm. No abdominal lymphadenopathy. Other:  No intraperitoneal free fluid. Musculoskeletal: No focal suspicious marrow enhancement within the visualized bony anatomy. IMPRESSION: 1. Cholelithiasis with probable subtle gallbladder wall edema. No substantial gallbladder wall thickening. 2. No biliary dilatation.  No choledocholithiasis. Electronically Signed   By: ErMisty Stanley.D.   On: 05/03/2022 08:51   USKoreabdomen Limited RUQ (LIVER/GB)  Result Date: 05/02/2022 CLINICAL DATA:  Abdominal pain. EXAM: ULTRASOUND ABDOMEN LIMITED RIGHT UPPER QUADRANT COMPARISON:  CT abdomen pelvis dated 05/02/2022. FINDINGS: Gallbladder: Layering echogenic debris within the gallbladder, likely sludge or small stones. The gallbladder wall is slightly thickened measuring 6 mm, likely partially related to underdistention. No pericholecystic fluid. Negative sonographic Murphy's sign. Common bile duct: Diameter: 6 mm Liver: No focal lesion identified. Within normal limits in parenchymal echogenicity. Portal vein is patent on color Doppler imaging with normal direction of blood flow towards the liver. Other: None. IMPRESSION: Gallbladder sludge  or small stones. No sonographic evidence of acute cholecystitis. Electronically Signed   By: Anner Crete M.D.   On: 05/02/2022 21:57   CT ABDOMEN PELVIS W CONTRAST  Result Date: 05/02/2022 CLINICAL DATA:  Pulmonary embolism suspected, high probability. Finished chemo in July for colon cancer. Chest pain and abdominal pain for 2 days. EXAM: CT ANGIOGRAPHY CHEST CT ABDOMEN AND PELVIS WITH CONTRAST TECHNIQUE: Multidetector CT imaging of the chest was performed using the standard protocol during bolus administration of intravenous contrast. Multiplanar CT image reconstructions and MIPs were obtained to evaluate the vascular anatomy. Multidetector CT imaging of the abdomen and pelvis was performed using the standard protocol during bolus administration of intravenous contrast. RADIATION DOSE  REDUCTION: This exam was performed according to the departmental dose-optimization program which includes automated exposure control, adjustment of the mA and/or kV according to patient size and/or use of iterative reconstruction technique. CONTRAST:  141m OMNIPAQUE IOHEXOL 350 MG/ML SOLN COMPARISON:  04/03/2022. FINDINGS: CTA CHEST FINDINGS Cardiovascular: The heart is normal in size and there is no pericardial effusion. Scattered coronary artery calcifications are noted. The aorta and pulmonary trunk are normal in caliber. No definite evidence of pulmonary embolism. Examination in the mid to lower lung fields is limited due to respiratory motion and mixing artifact. Mediastinum/Nodes: No mediastinal, hilar, or axillary lymphadenopathy. The thyroid gland, trachea, and esophagus are stable. Lungs/Pleura: Lung volumes are low and strandy opacities are present at the lung bases, possible atelectasis or infiltrate. No effusion or pneumothorax. Musculoskeletal: Degenerative changes in the thoracic spine. No acute or suspicious osseous abnormality. Review of the MIP images confirms the above findings. CT ABDOMEN and PELVIS FINDINGS Hepatobiliary: No focal liver abnormality is seen. Stones are present in the gallbladder. No biliary ductal dilatation. Pancreas: Unremarkable. No pancreatic ductal dilatation or surrounding inflammatory changes. Spleen: Normal in size without focal abnormality. Adrenals/Urinary Tract: The adrenal glands are within normal limits. The kidneys enhance symmetrically. No renal calculus or hydronephrosis. The bladder is unremarkable. Stomach/Bowel: The stomach is within normal limits. Right hemicolectomy changes are noted. No bowel obstruction, free air, or pneumatosis. No focal bowel wall thickening or inflammatory changes. Vascular/Lymphatic: Aortic atherosclerosis. A few prominent lymph nodes are present in the mesentery in the right lower quadrant measuring up to 9 mm, increased in size from  the prior exam. Reproductive: Prostate is unremarkable. Other: No abdominopelvic ascites. Musculoskeletal: No acute or suspicious osseous abnormality. Review of the MIP images confirms the above findings. IMPRESSION: 1. No definite evidence of pulmonary embolism. Evaluation of the pulmonary arteries in the mid to lower lung fields is limited due to mixing artifact and respiratory motion. 2. Low lung volumes with strandy atelectasis or infiltrate at the lung bases. 3. Nonspecific prominent lymph nodes are noted in the mesentery in the right lower quadrant, increased in size from the prior exam. Attention on follow-up is recommended. 4. Cholelithiasis. 5. Aortic atherosclerosis and coronary artery calcifications. Electronically Signed   By: LBrett FairyM.D.   On: 05/02/2022 20:48   CT Angio Chest Pulmonary Embolism (PE) W or WO Contrast  Result Date: 05/02/2022 CLINICAL DATA:  Pulmonary embolism suspected, high probability. Finished chemo in July for colon cancer. Chest pain and abdominal pain for 2 days. EXAM: CT ANGIOGRAPHY CHEST CT ABDOMEN AND PELVIS WITH CONTRAST TECHNIQUE: Multidetector CT imaging of the chest was performed using the standard protocol during bolus administration of intravenous contrast. Multiplanar CT image reconstructions and MIPs were obtained to evaluate the vascular anatomy. Multidetector CT imaging of the  abdomen and pelvis was performed using the standard protocol during bolus administration of intravenous contrast. RADIATION DOSE REDUCTION: This exam was performed according to the departmental dose-optimization program which includes automated exposure control, adjustment of the mA and/or kV according to patient size and/or use of iterative reconstruction technique. CONTRAST:  14m OMNIPAQUE IOHEXOL 350 MG/ML SOLN COMPARISON:  04/03/2022. FINDINGS: CTA CHEST FINDINGS Cardiovascular: The heart is normal in size and there is no pericardial effusion. Scattered coronary artery  calcifications are noted. The aorta and pulmonary trunk are normal in caliber. No definite evidence of pulmonary embolism. Examination in the mid to lower lung fields is limited due to respiratory motion and mixing artifact. Mediastinum/Nodes: No mediastinal, hilar, or axillary lymphadenopathy. The thyroid gland, trachea, and esophagus are stable. Lungs/Pleura: Lung volumes are low and strandy opacities are present at the lung bases, possible atelectasis or infiltrate. No effusion or pneumothorax. Musculoskeletal: Degenerative changes in the thoracic spine. No acute or suspicious osseous abnormality. Review of the MIP images confirms the above findings. CT ABDOMEN and PELVIS FINDINGS Hepatobiliary: No focal liver abnormality is seen. Stones are present in the gallbladder. No biliary ductal dilatation. Pancreas: Unremarkable. No pancreatic ductal dilatation or surrounding inflammatory changes. Spleen: Normal in size without focal abnormality. Adrenals/Urinary Tract: The adrenal glands are within normal limits. The kidneys enhance symmetrically. No renal calculus or hydronephrosis. The bladder is unremarkable. Stomach/Bowel: The stomach is within normal limits. Right hemicolectomy changes are noted. No bowel obstruction, free air, or pneumatosis. No focal bowel wall thickening or inflammatory changes. Vascular/Lymphatic: Aortic atherosclerosis. A few prominent lymph nodes are present in the mesentery in the right lower quadrant measuring up to 9 mm, increased in size from the prior exam. Reproductive: Prostate is unremarkable. Other: No abdominopelvic ascites. Musculoskeletal: No acute or suspicious osseous abnormality. Review of the MIP images confirms the above findings. IMPRESSION: 1. No definite evidence of pulmonary embolism. Evaluation of the pulmonary arteries in the mid to lower lung fields is limited due to mixing artifact and respiratory motion. 2. Low lung volumes with strandy atelectasis or infiltrate at  the lung bases. 3. Nonspecific prominent lymph nodes are noted in the mesentery in the right lower quadrant, increased in size from the prior exam. Attention on follow-up is recommended. 4. Cholelithiasis. 5. Aortic atherosclerosis and coronary artery calcifications. Electronically Signed   By: LBrett FairyM.D.   On: 05/02/2022 20:48    Labs: BNP (last 3 results) No results for input(s): "BNP" in the last 8760 hours. Basic Metabolic Panel: Recent Labs  Lab 05/02/22 1839 05/03/22 0340 05/04/22 0612 05/05/22 0740 05/07/22 0608  NA 140 139 137 139 141  K 3.7 3.8 3.3* 3.6 3.2*  CL 106 106 105 107 109  CO2 '23 26 24 25 25  '$ GLUCOSE 180* 151* 114* 115* 103*  BUN '13 10 9 7 8  '$ CREATININE 0.92 0.80 0.83 0.97 0.88  CALCIUM 9.1 8.9 8.6* 8.8* 8.7*   Liver Function Tests: Recent Labs  Lab 05/02/22 1839 05/03/22 0340 05/04/22 0612 05/05/22 0740 05/07/22 0608  AST 391* 341* 129* 93* 41  ALT 219* 281* 187* 151* 94*  ALKPHOS 139* 148* 185* 206* 186*  BILITOT 2.2* 3.4* 3.9* 2.3* 1.1  PROT 8.5* 8.1 7.7 8.1 8.0  ALBUMIN 4.0 3.6 3.4* 3.4* 3.5   Recent Labs  Lab 05/02/22 1839  LIPASE 62*   No results for input(s): "AMMONIA" in the last 168 hours. CBC: Recent Labs  Lab 05/02/22 1839 05/03/22 0340 05/04/22 0612 05/05/22 0740 05/07/22 01517  WBC 16.8* 15.4* 7.5 5.8 7.4  NEUTROABS 14.1*  --   --   --   --   HGB 13.4 12.7* 11.6* 11.5* 12.7*  HCT 43.1 38.3* 35.1* 35.5* 38.8*  MCV 97.3 90.8 91.2 91.3 91.1  PLT 236 235 205 218 269   Cardiac Enzymes: No results for input(s): "CKTOTAL", "CKMB", "CKMBINDEX", "TROPONINI" in the last 168 hours. BNP: Invalid input(s): "POCBNP" CBG: No results for input(s): "GLUCAP" in the last 168 hours. D-Dimer No results for input(s): "DDIMER" in the last 72 hours. Hgb A1c No results for input(s): "HGBA1C" in the last 72 hours. Lipid Profile No results for input(s): "CHOL", "HDL", "LDLCALC", "TRIG", "CHOLHDL", "LDLDIRECT" in the last 72  hours. Thyroid function studies No results for input(s): "TSH", "T4TOTAL", "T3FREE", "THYROIDAB" in the last 72 hours.  Invalid input(s): "FREET3" Anemia work up No results for input(s): "VITAMINB12", "FOLATE", "FERRITIN", "TIBC", "IRON", "RETICCTPCT" in the last 72 hours. Urinalysis    Component Value Date/Time   COLORURINE AMBER (A) 05/02/2022 1942   APPEARANCEUR CLEAR 05/02/2022 1942   LABSPEC 1.020 05/02/2022 1942   PHURINE 5.0 05/02/2022 1942   GLUCOSEU NEGATIVE 05/02/2022 1942   HGBUR NEGATIVE 05/02/2022 1942   BILIRUBINUR NEGATIVE 05/02/2022 1942   KETONESUR NEGATIVE 05/02/2022 1942   PROTEINUR NEGATIVE 05/02/2022 1942   UROBILINOGEN 1.0 05/21/2015 0606   NITRITE NEGATIVE 05/02/2022 1942   LEUKOCYTESUR NEGATIVE 05/02/2022 1942   Sepsis Labs Recent Labs  Lab 05/03/22 0340 05/04/22 0612 05/05/22 0740 05/07/22 0608  WBC 15.4* 7.5 5.8 7.4   Microbiology Recent Results (from the past 240 hour(s))  Blood culture (routine x 2)     Status: None (Preliminary result)   Collection Time: 05/02/22 10:46 PM   Specimen: BLOOD  Result Value Ref Range Status   Specimen Description   Final    BLOOD BLOOD LEFT ARM Performed at [facility], [place] [address] [place], [place] [address]    Special Requests   Final    Blood Culture results may not be optimal due to an excessive volume of blood received in culture bottles Blood Culture adequate volume Performed at [facility] [address] [place], [place] [address]    Culture   Final    NO GROWTH 4 DAYS Performed at [facility], [place] [address], [name], [place] [address]    Report Status PENDING  Incomplete  Blood culture (routine x 2)     Status: None (Preliminary result)   Collection Time: 05/02/22 11:00 PM   Specimen: BLOOD  Result Value Ref Range Status   Specimen Description   Final    BLOOD BLOOD LEFT FOREARM Performed at [facility] [address], [place], [place] [address]    Special Requests   Final    BOTTLES DRAWN AEROBIC AND ANAEROBIC Blood Culture adequate volume Performed at [facility] [address] [place], [place] [address]    Culture   Final    NO GROWTH 4 DAYS Performed at [facility], [place] [address], [place], [place] [address]    Report Status PENDING  Incomplete     Time coordinating discharge: 25 minutes  SIGNED: [name], MD  [facility] 05/07/2022, 10:17 AM  If 7PM-7AM, please contact night-coverage [email or web address]

## 2022-05-08 LAB — CULTURE, BLOOD (ROUTINE X 2)
Culture: NO GROWTH
Culture: NO GROWTH
Special Requests: ADEQUATE

## 2022-06-17 DIAGNOSIS — M5136 Other intervertebral disc degeneration, lumbar region: Secondary | ICD-10-CM | POA: Diagnosis not present

## 2022-06-17 DIAGNOSIS — G894 Chronic pain syndrome: Secondary | ICD-10-CM | POA: Diagnosis not present

## 2022-06-17 DIAGNOSIS — M5416 Radiculopathy, lumbar region: Secondary | ICD-10-CM | POA: Diagnosis not present

## 2022-07-18 ENCOUNTER — Other Ambulatory Visit: Payer: Self-pay

## 2022-07-18 ENCOUNTER — Ambulatory Visit (HOSPITAL_COMMUNITY)
Admission: RE | Admit: 2022-07-18 | Discharge: 2022-07-18 | Disposition: A | Discharge status: Home / Self Care | Payer: Medicaid Other | Source: Ambulatory Visit | Attending: Hematology | Admitting: Hematology

## 2022-07-18 ENCOUNTER — Inpatient Hospital Stay: Payer: Medicaid Other | Attending: Hematology

## 2022-07-18 DIAGNOSIS — J9811 Atelectasis: Secondary | ICD-10-CM | POA: Diagnosis not present

## 2022-07-18 DIAGNOSIS — K769 Liver disease, unspecified: Secondary | ICD-10-CM | POA: Diagnosis not present

## 2022-07-18 DIAGNOSIS — C182 Malignant neoplasm of ascending colon: Secondary | ICD-10-CM | POA: Diagnosis not present

## 2022-07-18 DIAGNOSIS — R918 Other nonspecific abnormal finding of lung field: Secondary | ICD-10-CM | POA: Diagnosis not present

## 2022-07-18 DIAGNOSIS — K219 Gastro-esophageal reflux disease without esophagitis: Secondary | ICD-10-CM | POA: Insufficient documentation

## 2022-07-18 DIAGNOSIS — C189 Malignant neoplasm of colon, unspecified: Secondary | ICD-10-CM | POA: Insufficient documentation

## 2022-07-18 DIAGNOSIS — Z79899 Other long term (current) drug therapy: Secondary | ICD-10-CM | POA: Insufficient documentation

## 2022-07-18 DIAGNOSIS — F1010 Alcohol abuse, uncomplicated: Secondary | ICD-10-CM | POA: Insufficient documentation

## 2022-07-18 DIAGNOSIS — Z9221 Personal history of antineoplastic chemotherapy: Secondary | ICD-10-CM | POA: Insufficient documentation

## 2022-07-18 LAB — CBC WITH DIFFERENTIAL (CANCER CENTER ONLY)
Abs Immature Granulocytes: 0.04 10*3/uL (ref 0.00–0.07)
Basophils Absolute: 0 10*3/uL (ref 0.0–0.1)
Basophils Relative: 1 %
Eosinophils Absolute: 0.2 10*3/uL (ref 0.0–0.5)
Eosinophils Relative: 2 %
HCT: 40.9 % (ref 39.0–52.0)
Hemoglobin: 13.8 g/dL (ref 13.0–17.0)
Immature Granulocytes: 1 %
Lymphocytes Relative: 43 %
Lymphs Abs: 2.7 10*3/uL (ref 0.7–4.0)
MCH: 29 pg (ref 26.0–34.0)
MCHC: 33.7 g/dL (ref 30.0–36.0)
MCV: 85.9 fL (ref 80.0–100.0)
Monocytes Absolute: 0.3 10*3/uL (ref 0.1–1.0)
Monocytes Relative: 5 %
Neutro Abs: 3.1 10*3/uL (ref 1.7–7.7)
Neutrophils Relative %: 48 %
Platelet Count: 309 10*3/uL (ref 150–400)
RBC: 4.76 MIL/uL (ref 4.22–5.81)
RDW: 13.4 % (ref 11.5–15.5)
WBC Count: 6.3 10*3/uL (ref 4.0–10.5)
nRBC: 0 % (ref 0.0–0.2)

## 2022-07-18 LAB — CMP (CANCER CENTER ONLY)
ALT: 19 U/L (ref 0–44)
AST: 17 U/L (ref 15–41)
Albumin: 4.5 g/dL (ref 3.5–5.0)
Alkaline Phosphatase: 109 U/L (ref 38–126)
Anion gap: 9 (ref 5–15)
BUN: 12 mg/dL (ref 6–20)
CO2: 24 mmol/L (ref 22–32)
Calcium: 9.7 mg/dL (ref 8.9–10.3)
Chloride: 105 mmol/L (ref 98–111)
Creatinine: 0.71 mg/dL (ref 0.61–1.24)
GFR, Estimated: >60 mL/min (ref >60)
Glucose, Bld: 108 mg/dL — ABNORMAL HIGH (ref 70–99)
Potassium: 3.8 mmol/L (ref 3.5–5.1)
Sodium: 138 mmol/L (ref 135–145)
Total Bilirubin: 0.4 mg/dL (ref 0.3–1.2)
Total Protein: 8.6 g/dL — ABNORMAL HIGH (ref 6.5–8.1)

## 2022-07-18 MED ORDER — IOHEXOL 9 MG/ML PO SOLN
ORAL | Status: AC
  Filled 2022-07-18: qty 1000

## 2022-07-18 MED ORDER — IOHEXOL 300 MG/ML  SOLN
100.0000 mL | Freq: Once | INTRAMUSCULAR | Status: AC | PRN
  Administered 2022-07-18: 100 mL via INTRAVENOUS

## 2022-07-20 NOTE — Assessment & Plan Note (Addendum)
-  diagnosed in 07/2021, -s/p partial colectomy by Dr. Rosendo Gros 07/23/21, final path showed: pT4a lesion with LVI and PNI, no perforation; clear margins; 9/19 positive LNs, pN2b --He received Capox 09/11/21 - 01/15/22, oxali discontinued due to significant nausea/vomiting and headaches. He completed Xeloda in late June 2023.  -on surveillance  -recent CT scan showed new hepatic subsegment VIII lesion just along the margin of the middle hepatic vein near the dome of the RIGHT hemiliver, and enlarging RIGHT upper lobe pulmonary nodule suspicious for metastatic disease.

## 2022-07-21 ENCOUNTER — Inpatient Hospital Stay (HOSPITAL_BASED_OUTPATIENT_CLINIC_OR_DEPARTMENT_OTHER): Payer: Medicaid Other | Admitting: Hematology

## 2022-07-21 ENCOUNTER — Other Ambulatory Visit: Payer: Self-pay

## 2022-07-21 VITALS — BP 136/101 | HR 91 | Temp 98.3°F | Resp 18 | Ht 67.0 in | Wt 192.9 lb

## 2022-07-21 DIAGNOSIS — C182 Malignant neoplasm of ascending colon: Secondary | ICD-10-CM | POA: Diagnosis not present

## 2022-07-21 DIAGNOSIS — C189 Malignant neoplasm of colon, unspecified: Secondary | ICD-10-CM | POA: Diagnosis not present

## 2022-07-21 DIAGNOSIS — Z9221 Personal history of antineoplastic chemotherapy: Secondary | ICD-10-CM | POA: Diagnosis not present

## 2022-07-21 DIAGNOSIS — M5432 Sciatica, left side: Secondary | ICD-10-CM

## 2022-07-21 DIAGNOSIS — K219 Gastro-esophageal reflux disease without esophagitis: Secondary | ICD-10-CM | POA: Diagnosis not present

## 2022-07-21 DIAGNOSIS — I10 Essential (primary) hypertension: Secondary | ICD-10-CM | POA: Diagnosis not present

## 2022-07-21 DIAGNOSIS — F1010 Alcohol abuse, uncomplicated: Secondary | ICD-10-CM | POA: Diagnosis not present

## 2022-07-21 DIAGNOSIS — F419 Anxiety disorder, unspecified: Secondary | ICD-10-CM | POA: Diagnosis not present

## 2022-07-21 DIAGNOSIS — G4700 Insomnia, unspecified: Secondary | ICD-10-CM | POA: Diagnosis not present

## 2022-07-21 DIAGNOSIS — Z79899 Other long term (current) drug therapy: Secondary | ICD-10-CM | POA: Diagnosis not present

## 2022-07-21 LAB — CEA (IN HOUSE-CHCC): CEA (CHCC-In House): 1.9 ng/mL (ref 0.00–5.00)

## 2022-07-21 NOTE — Progress Notes (Signed)
[place]   Telephone:([phone]) [phone] Fax:(336) [phone]   Clinic Follow up Note   Patient Care Team: [facility] as PCP - General (Family Medicine) Ralene Ok, MD as Consulting Physician (General Surgery) Alla Feeling, NP as Nurse Practitioner (Nurse Practitioner) Truitt Merle, MD as Consulting Physician (Hematology) Mohammed Kindle, MD as Referring Physician (Pain Medicine)  Date of Service:  07/21/2022  CHIEF COMPLAINT: f/u of colon cancer  CURRENT THERAPY:  Expand All Collapse All  [place]   Telephone:([phone]) [phone] Fax:(336) [phone]   Clinic Follow up Note    Patient Care Team: [facility] as PCP - General (Family Medicine) Ralene Ok, MD as Consulting Physician (General Surgery) Alla Feeling, NP as Nurse Practitioner (Nurse Practitioner) Truitt Merle, MD as Consulting Physician (Hematology) Mohammed Kindle, MD as Referring Physician (Pain Medicine)   Date of Service:  03/21/2022   CHIEF COMPLAINT: f/u of colon cancer   CURRENT THERAPY:  Adjuvant CAPOX q3 weeks x6 months, starting 09/11/21     ASSESSMENT:  Joseph Haney is a 46 y.o. male with   Cancer of right colon ([place]) -diagnosed in 07/2021, -s/p partial colectomy by Dr. Rosendo Gros 07/23/21, final path showed: pT4a lesion with LVI and PNI, no perforation; clear margins; 9/19 positive LNs, pN2b --He received Capox 09/11/21 - 01/15/22, oxali discontinued due to significant nausea/vomiting and headaches. He completed Xeloda in late June 2023.  -on surveillance  -recent CT scan showed new hepatic subsegment VIII lesion just along the margin of the middle hepatic vein near the dome of the RIGHT hemiliver, and enlarging RIGHT upper lobe pulmonary nodule suspicious for metastatic disease. -liver biopsy as soon as possible    PLAN: -lab and Ct Scan personally reviewed with patient, suspicious for oligo liver  metastasis. -Ultrasound-guided liver biopsy to confirm -F/U phone visit after biopsy to discuss results     SUMMARY OF ONCOLOGIC HISTORY: Oncology History  Cancer of right colon ([place])  07/19/2021 Imaging   CT AP IMPRESSION: Focal area of high-grade narrowing of the proximal ascending colon may be sequela of chronic inflammation and stricture versus a mass. There is mild distension of the cecum and terminal ileum. Clinical correlation and further evaluation with lower GI study or colonoscopy recommended.   07/22/2021 Procedure   Colonoscopy A severe stenosis was found in the distal ascending colon and was non-traversed. It was ulcerated and inflamed. This could be inflammatory versus malignant stricture.   07/22/2021 Initial Biopsy   FINAL MICROSCOPIC DIAGNOSIS:  A. COLON, ASCENDING, BIOPSY:  - Invasive colorectal adenocarcinoma, moderately differentiated.    07/23/2021 Definitive Surgery   PRE-OPERATIVE DIAGNOSIS:  right colon mass POST-OPERATIVE DIAGNOSIS:  right colon mass PROCEDURE:  Procedure(s): XI ROBOT ASSISTED LAPAROSCOPIC RIGHT COLECTOMY (N/A) Dr. Rosendo Gros   07/23/2021 Pathology Results   FINAL MICROSCOPIC DIAGNOSIS:  A. COLON, RIGHT, RESECTION:  - Invasive moderately differentiated colonic adenocarcinoma, 4.5 cm.  - Carcinoma extends into pericolonic connective tissue and focally  involves serosal surface.  - Margins not involved.  - Metastatic carcinoma in nine of nineteen lymph nodes (9/19).  - Unremarkable appendix. MMR normal, MSI-stable    07/23/2021 Cancer Staging   Staging form: Colon and Rectum, AJCC 8th Edition - Pathologic stage from 07/23/2021: Stage IIIC (pT4a, pN2b, cM0) - Signed by Alla Feeling, NP on 08/15/2021 Stage prefix: Initial diagnosis Histologic grading system: 4 grade system Histologic grade (G): G2 Laterality: Right Lymph-vascular invasion (LVI): LVI present/identified, NOS Perineural invasion (PNI): Present  Microsatellite  instability (MSI): Stable   07/24/2021 Initial Diagnosis   Colon adenocarcinoma ([place])   07/25/2021 Tumor Marker   CEA: 2.9   07/28/2021 Imaging   CT chest IMPRESSION: 1. No evidence of metastatic disease within the chest. 2. Lungs demonstrate atelectasis, most evident lower lobes and bases of the left upper lobe lingula and right middle lobe. No convincing pneumonia and no evidence of pulmonary edema.     09/11/2021 - 01/15/2022 Chemotherapy   Patient is on Treatment Plan : COLORECTAL Xelox (Capeox) q21d        INTERVAL HISTORY:  Joseph Haney is here for a follow up of colo cancer He was last seen by me on He presents to the clinic alone. Pt states he's doing well. No numbness in fingers. Appetite back to normal.Energy level back to normal. He does not work at the moment.No Pain. Pt use the mychart.   All other systems were reviewed with the patient and are negative.  MEDICAL HISTORY:  Past Medical History:  Diagnosis Date   Alcohol abuse    Cancer ([place])    GERD (gastroesophageal reflux disease)    Seizure ([place])     SURGICAL HISTORY: Past Surgical History:  Procedure Laterality Date   BIOPSY  07/22/2021   Procedure: BIOPSY;  Surgeon: [name], MD;  Location: [facility];  Service: Gastroenterology;;   COLON SURGERY     COLONOSCOPY WITH PROPOFOL N/A 07/22/2021   Procedure: COLONOSCOPY WITH PROPOFOL;  Surgeon: [name], MD;  Location: [facility];  Service: Gastroenterology;  Laterality: N/A;   NO PAST SURGERIES     SUBMUCOSAL TATTOO INJECTION  07/22/2021   Procedure: SUBMUCOSAL TATTOO INJECTION;  Surgeon: [name], MD;  Location: [facility];  Service: Gastroenterology;;    I have reviewed the social history and family history with the patient and they are unchanged from previous note.  ALLERGIES:  is allergic to morphine and related.  MEDICATIONS:  Current Outpatient Medications  Medication Sig Dispense Refill   carvedilol  (COREG) 25 MG tablet Take 1 tablet (25 mg total) by mouth 2 (two) times daily with a meal. 30 tablet 0   cyclobenzaprine (FLEXERIL) 10 MG tablet Take 10-20 mg by mouth 2 (two) times daily as needed for muscle spasms.     dexamethasone (DECADRON) 4 MG tablet Take 1 tablet (4 mg total) by mouth daily. Take for 3-5 days after chemo for nausea and fatigue 20 tablet 0   docusate sodium (COLACE) 100 MG capsule Take 100 mg by mouth daily.     escitalopram (LEXAPRO) 10 MG tablet Take 10 mg by mouth daily.     ferrous sulfate 325 (65 FE) MG tablet Take 1 tablet (325 mg total) by mouth 2 (two) times daily with a meal. 100 tablet 3   ondansetron (ZOFRAN) 4 MG tablet Take 1 tablet (4 mg total) by mouth every 8 (eight) hours as needed for nausea or vomiting. 20 tablet 0   oxyCODONE (ROXICODONE) 5 MG immediate release tablet Take 1 tablet (5 mg total) by mouth every 6 (six) hours as needed for severe pain. 20 tablet 0   polyethylene glycol (MIRALAX / GLYCOLAX) 17 g packet Take 17 g by mouth daily as needed for mild constipation or moderate constipation. 14 each 0   potassium chloride SA (KLOR-CON M) 20 MEQ tablet Take 1 tablet (20 mEq total) by mouth daily for 5 days. 5 tablet 0   prochlorperazine (COMPAZINE) 10 MG tablet Take 1 tablet (10 mg total) by mouth  every 6 (six) hours as needed (Nausea or vomiting). 30 tablet 1   promethazine (PHENERGAN) 25 MG tablet Take 1 tablet (25 mg total) by mouth every 6 (six) hours as needed for nausea or vomiting. 30 tablet 0   triamcinolone cream (KENALOG) 0.1 % Apply 1 Application topically daily as needed (for rash and itching).     No current facility-administered medications for this visit.    PHYSICAL EXAMINATION: ECOG PERFORMANCE STATUS: 0 - Asymptomatic  Vitals:   07/21/22 1331  BP: (!) 136/101  Pulse: 91  Resp: 18  Temp: 98.3 F (36.8 C)  SpO2: 100%   Wt Readings from Last 3 Encounters:  07/21/22 192 lb 14.4 oz (87.5 kg)  05/02/22 185 lb (83.9 kg)   03/21/22 179 lb 1.6 oz (81.2 kg)     GENERAL:alert, no distress and comfortable SKIN: skin color normal, no rashes or significant lesions EYES: normal, Conjunctiva are pink and non-injected, sclera clear  NEURO: alert & oriented x 3 with fluent speech   LABORATORY DATA:  I have reviewed the data as listed    Latest Ref Rng & Units 07/18/2022    2:31 PM 05/07/2022    6:08 AM 05/05/2022    7:40 AM  CBC  WBC 4.0 - 10.5 K/uL 6.3  7.4  5.8   Hemoglobin 13.0 - 17.0 g/dL 13.8  12.7  11.5   Hematocrit 39.0 - 52.0 % 40.9  38.8  35.5   Platelets 150 - 400 K/uL 309  269  218         Latest Ref Rng & Units 07/18/2022    2:31 PM 05/07/2022    6:08 AM 05/05/2022    7:40 AM  CMP  Glucose 70 - 99 mg/dL 108  103  115   BUN 6 - 20 mg/dL _0 Creatinine 0.61 - 1.24 mg/dL 0.71  0.88  0.97   Sodium 135 - 145 mmol/L 138  141  139   Potassium 3.5 - 5.1 mmol/L 3.8  3.2  3.6   Chloride 98 - 111 mmol/L 105  109  107   CO2 22 - 32 mmol/L _1 Calcium 8.9 - 10.3 mg/dL 9.7  8.7  8.8   Total Protein 6.5 - 8.1 g/dL 8.6  8.0  8.1   Total Bilirubin 0.3 - 1.2 mg/dL 0.4  1.1  2.3   Alkaline Phos 38 - 126 U/L 109  186  206   AST 15 - 41 U/L 17  41  93   ALT 0 - 44 U/L 19  94  151       RADIOGRAPHIC STUDIES: I have personally reviewed the radiological images as listed and agreed with the findings in the report. No results found.    Orders Placed This Encounter  Procedures   [place] CORE BIOPSY (LIVER)    Standing Status:   Future    Standing Expiration Date:   07/22/2023    Order Specific Question:   Lab orders requested (DO NOT place separate lab orders, these will be automatically ordered during procedure specimen collection):    Answer:   Surgical Pathology    Order Specific Question:   Reason for Exam (SYMPTOM  OR DIAGNOSIS REQUIRED)    Answer:   liver lesion on CT, confirm metastasis    Order Specific Question:   Preferred location?    Answer:   [facility]   Ambulatory  referral to [facility]  Referral Priority:   Routine    Referral Type:   Consultation    Referral Reason:   Specialty Services Required    Number of Visits Requested:   1   All questions were answered. The patient knows to call the clinic with any problems, questions or concerns. No barriers to learning was detected. The total time spent in the appointment was 30 minutes.     Truitt Merle, MD 07/21/2022   Felicity Coyer, CMA, am acting as scribe for Truitt Merle, MD.   I have reviewed the above documentation for accuracy and completeness, and I agree with the above.

## 2022-07-21 NOTE — Progress Notes (Signed)
Criselda Peaches, MD  Donita Brooks D; P [facility] Procedure Requests Approved for attempted US guided core biopsy of new lesion in segment 8.  Hx colon cancer.  [place]

## 2022-07-22 ENCOUNTER — Telehealth: Payer: Self-pay | Admitting: Hematology

## 2022-07-22 NOTE — Telephone Encounter (Signed)
Called patient per 11/20 in basket. Scheduled gen 60 appointment with patient.

## 2022-07-22 NOTE — Telephone Encounter (Signed)
Called patient to inform of upcoming appointments. Patient notified.  

## 2022-08-04 ENCOUNTER — Other Ambulatory Visit: Payer: Self-pay | Admitting: Student

## 2022-08-04 ENCOUNTER — Other Ambulatory Visit (HOSPITAL_COMMUNITY): Payer: Self-pay | Admitting: Student

## 2022-08-04 DIAGNOSIS — C182 Malignant neoplasm of ascending colon: Secondary | ICD-10-CM

## 2022-08-04 DIAGNOSIS — Z9049 Acquired absence of other specified parts of digestive tract: Secondary | ICD-10-CM | POA: Diagnosis not present

## 2022-08-04 DIAGNOSIS — C189 Malignant neoplasm of colon, unspecified: Secondary | ICD-10-CM | POA: Diagnosis not present

## 2022-08-04 DIAGNOSIS — C787 Secondary malignant neoplasm of liver and intrahepatic bile duct: Secondary | ICD-10-CM | POA: Diagnosis not present

## 2022-08-05 ENCOUNTER — Other Ambulatory Visit: Payer: Self-pay

## 2022-08-05 ENCOUNTER — Encounter (HOSPITAL_COMMUNITY): Payer: Self-pay

## 2022-08-05 ENCOUNTER — Other Ambulatory Visit: Payer: Self-pay | Admitting: Hematology

## 2022-08-05 ENCOUNTER — Ambulatory Visit (HOSPITAL_COMMUNITY)
Admission: RE | Admit: 2022-08-05 | Discharge: 2022-08-05 | Disposition: A | Discharge status: Home / Self Care | Payer: Medicaid Other | Source: Ambulatory Visit | Attending: Hematology | Admitting: Hematology

## 2022-08-05 DIAGNOSIS — C182 Malignant neoplasm of ascending colon: Secondary | ICD-10-CM | POA: Diagnosis not present

## 2022-08-05 DIAGNOSIS — M5432 Sciatica, left side: Secondary | ICD-10-CM

## 2022-08-05 DIAGNOSIS — K769 Liver disease, unspecified: Secondary | ICD-10-CM | POA: Diagnosis not present

## 2022-08-05 DIAGNOSIS — K7689 Other specified diseases of liver: Secondary | ICD-10-CM | POA: Diagnosis not present

## 2022-08-05 LAB — CBC
HCT: 40 % (ref 39.0–52.0)
Hemoglobin: 13.5 g/dL (ref 13.0–17.0)
MCH: 29.5 pg (ref 26.0–34.0)
MCHC: 33.8 g/dL (ref 30.0–36.0)
MCV: 87.3 fL (ref 80.0–100.0)
Platelets: 283 10*3/uL (ref 150–400)
RBC: 4.58 MIL/uL (ref 4.22–5.81)
RDW: 13.4 % (ref 11.5–15.5)
WBC: 7.1 10*3/uL (ref 4.0–10.5)
nRBC: 0 % (ref 0.0–0.2)

## 2022-08-05 LAB — PROTIME-INR
INR: 1 (ref 0.8–1.2)
Prothrombin Time: 13.4 seconds (ref 11.4–15.2)

## 2022-08-05 MED ORDER — MIDAZOLAM HCL 2 MG/2ML IJ SOLN
INTRAMUSCULAR | Status: AC
  Filled 2022-08-05: qty 2

## 2022-08-05 MED ORDER — FENTANYL CITRATE (PF) 100 MCG/2ML IJ SOLN
INTRAMUSCULAR | Status: AC
  Filled 2022-08-05: qty 2

## 2022-08-05 MED ORDER — GELATIN ABSORBABLE 12-7 MM EX MISC
CUTANEOUS | Status: AC
  Filled 2022-08-05: qty 1

## 2022-08-05 MED ORDER — SODIUM CHLORIDE 0.9 % IV SOLN: INTRAVENOUS | Status: DC

## 2022-08-05 MED ORDER — LIDOCAINE HCL (PF) 1 % IJ SOLN
INTRAMUSCULAR | Status: AC
  Filled 2022-08-05: qty 30

## 2022-08-05 NOTE — Addendum Note (Signed)
Encounter addended by: Carolyne Littles, RN on: 08/05/2022 8:51 AM  Actions taken: Flowsheet filed in Chiropractor, Financial controller accepted, Staff updated in Chiropractor, Note filed in Chiropractor, Clinical Note Signed, LDA properties accepted, Procedure Event Log accessed, Event accepted in Narrator

## 2022-08-05 NOTE — Sedation Documentation (Signed)
Unable to visualize lesion. Procedure cancelled.

## 2022-08-05 NOTE — Addendum Note (Signed)
Encounter addended by: Fredric Dine, RN on: 08/05/2022 8:04 AM  Actions taken: Procedure Event Log accessed, Event accepted in Narrator

## 2022-08-05 NOTE — H&P (Signed)
Chief Complaint: Patient was seen in consultation today for liver lesion biopsy at the request of [name],[name]  Referring Physician(s): [name],[name]  Supervising Physician: Markus Daft  Patient Status: [name] - Out-pt  History of Present Illness: Joseph Haney is a 46 y.o. male   Hx colon cancer 07/2021 - follows with Dr Burr Medico  Cancer of right colon [facility]) -diagnosed in 07/2021, -s/p partial colectomy by Dr. Rosendo Gros 07/23/21, final path showed: pT4a lesion with LVI and PNI, no perforation; clear margins; 9/19 positive LNs, pN2b --He received Capox 09/11/21 - 01/15/22, oxali discontinued due to significant nausea/vomiting and headaches. He completed Xeloda in late June 2023.  -on surveillance  -recent CT scan {07/18/22) showed new hepatic subsegment VIII lesion just along the margin of the middle hepatic vein near the dome of the RIGHT hemiliver, and enlarging RIGHT upper lobe pulmonary nodule suspicious for metastatic disease. -liver biopsy as soon as possible    Scheduled now for liver lesion biopsy per Oncology  Past Medical History:  Diagnosis Date   Alcohol abuse    Cancer ([place])    GERD (gastroesophageal reflux disease)    Seizure ([place])     Past Surgical History:  Procedure Laterality Date   BIOPSY  07/22/2021   Procedure: BIOPSY;  Surgeon: [name], MD;  Location: [facility];  Service: Gastroenterology;;   COLON SURGERY     COLONOSCOPY WITH PROPOFOL N/A 07/22/2021   Procedure: COLONOSCOPY WITH PROPOFOL;  Surgeon: [name], MD;  Location: [facility];  Service: Gastroenterology;  Laterality: N/A;   NO PAST SURGERIES     SUBMUCOSAL TATTOO INJECTION  07/22/2021   Procedure: SUBMUCOSAL TATTOO INJECTION;  Surgeon: [name], MD;  [facility]: [facility];  Service: Gastroenterology;;    Allergies: Morphine and related  Medications: Prior to Admission medications   Medication Sig Start Date End Date Taking? Authorizing Provider  carvedilol (COREG)  25 MG tablet Take 1 tablet (25 mg total) by mouth 2 (two) times daily with a meal. 01/15/22  Yes Truitt Merle, MD  cyclobenzaprine (FLEXERIL) 10 MG tablet Take 10-20 mg by mouth 2 (two) times daily as needed for muscle spasms. 06/25/21  Yes [provider]  dexamethasone (DECADRON) 4 MG tablet Take 1 tablet (4 mg total) by mouth daily. Take for 3-5 days after chemo for nausea and fatigue 11/29/21  Yes Truitt Merle, MD  docusate sodium (COLACE) 100 MG capsule Take 100 mg by mouth daily.   Yes [provider]  escitalopram (LEXAPRO) 10 MG tablet Take 10 mg by mouth daily. 04/29/22  Yes [provider]  ferrous sulfate 325 (65 FE) MG tablet Take 1 tablet (325 mg total) by mouth 2 (two) times daily with a meal. 08/01/21  Yes [name], [name], MD  HYDROcodone-acetaminophen (NORCO/VICODIN) 5-325 MG tablet Take 1 tablet by mouth every 6 (six) hours as needed for moderate pain.   Yes [provider]  oxyCODONE (ROXICODONE) 5 MG immediate release tablet Take 1 tablet (5 mg total) by mouth every 6 (six) hours as needed for severe pain. 04/03/22  Yes [name], [name]  polyethylene glycol (MIRALAX / GLYCOLAX) 17 g packet Take 17 g by mouth daily as needed for mild constipation or moderate constipation. 08/01/21  Yes [name], [name], MD  prochlorperazine (COMPAZINE) 10 MG tablet Take 1 tablet (10 mg total) by mouth every 6 (six) hours as needed (Nausea or vomiting). 11/29/21  Yes Truitt Merle, MD  promethazine (PHENERGAN) 25 MG tablet Take 1 tablet (25 mg total) by mouth every  6 (six) hours as needed for nausea or vomiting. 11/29/21  Yes Truitt Merle, MD  triamcinolone cream (KENALOG) 0.1 % Apply 1 Application topically daily as needed (for rash and itching). 04/29/22  Yes [provider]  ondansetron (ZOFRAN) 4 MG tablet Take 1 tablet (4 mg total) by mouth every 8 (eight) hours as needed for nausea or vomiting. 04/03/22   [name], [name]  potassium chloride SA (KLOR-CON M) 20 MEQ  tablet Take 1 tablet (20 mEq total) by mouth daily for 5 days. 05/07/22 05/12/22  [name], MD     Family History  Problem Relation Age of Onset   Coronary artery disease Mother    Coronary artery disease Father    Heart attack Father 65   Cancer Cousin 22       prostate    Social History   Socioeconomic History   Marital status: Married    Spouse name: Not on file   Number of children: 3   Years of education: Not on file   Highest education level: Not on file  Occupational History   Not on file  Tobacco Use   Smoking status: Never   Smokeless tobacco: Never  Vaping Use   Vaping Use: Never used  Substance and Sexual Activity   Alcohol use: Not Currently    Comment: drank from age 36 - 39, quit 7 years ago   Drug use: No   Sexual activity: Yes  Other Topics Concern   Not on file  Social History Narrative   Not on file   Social Determinants of Health   Financial Resource Strain: Not on file  Food Insecurity: Not on file  Transportation Needs: Not on file  Physical Activity: Not on file  Stress: Not on file  Social Connections: Not on file    Review of Systems: A 12 point ROS discussed and pertinent positives are indicated in the HPI above.  All other systems are negative.  Review of Systems  Constitutional:  Positive for appetite change and fatigue.  Respiratory:  Negative for cough and shortness of breath.   Cardiovascular:  Negative for chest pain.  Gastrointestinal:  Negative for abdominal pain, diarrhea and nausea.  Psychiatric/Behavioral:  Negative for behavioral problems and confusion.     Vital Signs: BP (!) 129/90   Pulse 85   Temp (!) 97.1 F (36.2 C)   Resp 17   Ht '5\' 6"'$  (1.676 m)   Wt 192 lb (87.1 kg)   SpO2 98%   BMI 30.99 kg/m     Physical Exam Vitals reviewed.  HENT:     Mouth/Throat:     Mouth: Mucous membranes are moist.  Cardiovascular:     Rate and Rhythm: Normal rate and regular rhythm.     Heart sounds: Normal heart  sounds.  Pulmonary:     Effort: Pulmonary effort is normal.     Breath sounds: Normal breath sounds.  Abdominal:     Palpations: Abdomen is soft.  Musculoskeletal:        General: Normal range of motion.  Skin:    General: Skin is warm.  Neurological:     Mental Status: He is alert and oriented to person, place, and time.  Psychiatric:        Behavior: Behavior normal.     Imaging: CT CHEST ABDOMEN PELVIS W CONTRAST  Result Date: 07/18/2022 CLINICAL DATA:  A 46 year old male presents with history of stage III colon cancer. Monitoring of disease. * Tracking  Code: BO * EXAM: CT CHEST, ABDOMEN, AND PELVIS WITH CONTRAST TECHNIQUE: Multidetector CT imaging of the chest, abdomen and pelvis was performed following the standard protocol during bolus administration of intravenous contrast. RADIATION DOSE REDUCTION: This exam was performed according to the departmental dose-optimization program which includes automated exposure control, adjustment of the mA and/or kV according to patient size and/or use of iterative reconstruction technique. CONTRAST:  145m OMNIPAQUE IOHEXOL 300 MG/ML  SOLN COMPARISON:  September of 2023. FINDINGS: CT CHEST FINDINGS Cardiovascular: Heart size is stable. Aortic caliber is normal. Calcified coronary artery disease of LEFT coronary circulation. Central pulmonary vasculature is unremarkable. Mediastinum/Nodes: No adenopathy in the chest. Esophagus grossly normal aside from mildly patulous appearance which is unchanged. Lungs/Pleura: Signs of RIGHT middle lobe atelectasis over the RIGHT hemidiaphragm. Pulmonary nodule in the RIGHT upper lobe (image 41/6) this is 6 mm and previously measured 3-4 mm in August of 2023. Airways are patent. Mild lingular scarring. No sign of pleural effusion. Musculoskeletal: See below for full musculoskeletal details. There is no chest wall mass. CT ABDOMEN PELVIS FINDINGS Hepatobiliary: New hepatic subsegment VIII lesion just along the margin of  the middle hepatic vein near the dome of the RIGHT hemiliver (image 47/2) 2.2 x 1.8 cm. No additional focal, suspicious hepatic lesion. No pericholecystic stranding or biliary duct distension. Portal vein is patent. Pancreas: Normal, without mass, inflammation or ductal dilatation. Spleen: Normal. Adrenals/Urinary Tract: Adrenal glands are unremarkable. Symmetric renal enhancement. No sign of hydronephrosis. No suspicious renal lesion or perinephric stranding. Urinary bladder is grossly unremarkable. Stomach/Bowel: Signs of RIGHT hemicolectomy. No acute gastrointestinal findings. Vascular/Lymphatic: Aortic atherosclerosis. No sign of aneurysm. Smooth contour of the IVC. There is no gastrohepatic or hepatoduodenal ligament lymphadenopathy. No retroperitoneal or mesenteric lymphadenopathy. No pelvic sidewall lymphadenopathy. Atherosclerotic changes are mild. Reproductive: Unremarkable by CT. Other: Small fat containing umbilical hernia.  No ascites. Musculoskeletal: No acute bone finding. No destructive bone process. Spinal degenerative changes. IMPRESSION: 1. New hepatic subsegment VIII lesion just along the margin of the middle hepatic vein near the dome of the RIGHT hemiliver. Findings are most compatible with metastatic lesion. 2. Enlarging RIGHT upper lobe pulmonary nodule suspicious for metastatic disease. 3. Signs of RIGHT hemicolectomy. 4. Calcified coronary artery disease of LEFT coronary circulation. 5. Small fat containing umbilical hernia. 6. Aortic atherosclerosis. Aortic Atherosclerosis (ICD10-I70.0). These results will be called to the ordering clinician or representative by the Radiologist Assistant, and communication documented in the PACS or [employer] Electronically Signed   By: GZetta BillsM.D.   On: 07/18/2022 17:16    Labs:  CBC: Recent Labs    05/04/22 0612 05/05/22 0740 05/07/22 0608 07/18/22 1431  WBC 7.5 5.8 7.4 6.3  HGB 11.6* 11.5* 12.7* 13.8  HCT 35.1* 35.5* 38.8*  40.9  PLT 205 218 269 309    COAGS: No results for input(s): "INR", "APTT" in the last 8760 hours.  BMP: Recent Labs    05/04/22 0612 05/05/22 0740 05/07/22 0608 07/18/22 1431  NA 137 139 141 138  K 3.3* 3.6 3.2* 3.8  CL 105 107 109 105  CO2 '24 25 25 24  '$ GLUCOSE 114* 115* 103* 108*  BUN '9 7 8 12  '$ CALCIUM 8.6* 8.8* 8.7* 9.7  CREATININE 0.83 0.97 0.88 0.71  GFRNONAA >60 >60 >60 >60    LIVER FUNCTION TESTS: Recent Labs    05/04/22 0612 05/05/22 0740 05/07/22 0608 07/18/22 1431  BILITOT 3.9* 2.3* 1.1 0.4  AST 129* 93* 41 17  ALT 187*  151* 94* 19  ALKPHOS 185* 206* 186* 109  PROT 7.7 8.1 8.0 8.6*  ALBUMIN 3.4* 3.4* 3.5 4.5    TUMOR MARKERS: No results for input(s): "AFPTM", "CEA", "CA199", "CHROMGRNA" in the last 8760 hours.  Assessment and Plan:  Hx colon cancer 07/2021 Follows with Dr Burr Medico Follow up CT 07/18/22 revealing new liver lesion Now scheduled for liver lesion biopsy Risks and benefits of liver lesion biopsy was discussed with the patient and/or patient's family including, but not limited to bleeding, infection, damage to adjacent structures or low yield requiring additional tests.  All of the questions were answered and there is agreement to proceed.  Consent signed and in chart.  Thank you for this interesting consult.  I greatly enjoyed meeting Kionte Baumgardner and look forward to participating in their care.  A copy of this report was sent to the requesting provider on this date.  Electronically Signed: Lavonia Drafts, [name] 08/05/2022, 7:15 AM   I spent a total of  30 Minutes   in face to face in clinical consultation, greater than 50% of which was counseling/coordinating care for liver lesion biopsy

## 2022-08-05 NOTE — Addendum Note (Signed)
Encounter addended by: Levora Dredge, RT on: 08/05/2022 8:51 AM  Actions taken: Imaging Exam begun

## 2022-08-05 NOTE — Progress Notes (Signed)
Patient ID: Joseph Haney, male   DOB: 01/24/1976, 46 y.o.   MRN: [id number]  Liver was evaluated with [place].  Could not identify the lesion near the hepatic dome.  Did not feel that CT biopsy would be feasible.  Patient will be discharged home and will discuss situation with Dr. Burr Medico.

## 2022-08-05 NOTE — Addendum Note (Signed)
Encounter addended by: Markus Daft, MD on: 08/05/2022 8:55 AM  Actions taken: Clinical Note Signed

## 2022-08-05 NOTE — Addendum Note (Signed)
Encounter addended by: Levora Dredge, RT on: 08/05/2022 8:58 AM  Actions taken: Imaging Exam ended

## 2022-08-07 ENCOUNTER — Telehealth: Payer: Medicaid Other | Admitting: Hematology

## 2022-08-07 NOTE — Assessment & Plan Note (Addendum)
Stage IIIC (pT4a, pN2b, cM0), MSS -diagnosed in 07/2021 -s/p partial colectomy by Dr. Rosendo Gros 07/23/21, final path showed: pT4a lesion with LVI and PNI, no perforation; clear margins; 9/19 positive LNs, pN2b --He received Capox 09/11/21 - 01/15/22, oxali discontinued due to significant nausea/vomiting and headaches. He completed Xeloda in late June 2023.  -on surveillance  -recent CT scan showed new hepatic subsegment VIII lesion just along the margin of the middle hepatic vein near the dome of the RIGHT hemiliver, and enlarging RIGHT upper lobe pulmonary nodule suspicious for metastatic disease. -liver biopsy was attempted but aborted since no liver lesion seen on [place] -I recommend GuardantReveal to check circulating tumor DNA, and PET scan for further evaluation

## 2022-08-07 NOTE — Progress Notes (Signed)
[place]   Telephone:([phone] Fax:([phone]   Clinic Follow up Note   Patient Care Team: [facility] as PCP - General (Family Medicine) Ralene Ok, MD as Consulting Physician (General Surgery) Alla Feeling, NP as Nurse Practitioner (Nurse Practitioner) Truitt Merle, MD as Consulting Physician (Hematology) Mohammed Kindle, MD as Referring Physician (Pain Medicine)  Date of Service:  08/08/2022  I connected with [name] on 08/08/2022 at  9:00 AM EST by telephone visit and verified that I am speaking with the correct person using two identifiers.  I discussed the limitations, risks, security and privacy concerns of performing an evaluation and management service by telephone and the availability of in person appointments. I also discussed with the patient that there may be a patient responsible charge related to this service. The patient expressed understanding and agreed to proceed.   Other persons participating in the visit and their role in the encounter: No.   Patient's location:  Home Provider's location:  Office  CHIEF COMPLAINT: f/u of colon cancer   CURRENT THERAPY:  Adjuvant CAPOX q3 weeks x6 months, starting 09/11/21   ASSESSMENT & PLAN:  Joseph Haney is a 46 y.o. male with   Cancer of right colon ([place]) Stage IIIC (pT4a, pN2b, cM0), MSS -diagnosed in 07/2021 -s/p partial colectomy by Dr. Rosendo Gros 07/23/21, final path showed: pT4a lesion with LVI and PNI, no perforation; clear margins; 9/19 positive LNs, pN2b --He received Capox 09/11/21 - 01/15/22, oxali discontinued due to significant nausea/vomiting and headaches. He completed Xeloda in late June 2023.  -on surveillance  -recent CT scan showed new hepatic subsegment VIII lesion just along the margin of the middle hepatic vein near the dome of the RIGHT hemiliver, and enlarging RIGHT upper lobe pulmonary nodule suspicious for metastatic disease. -liver  biopsy was attempted but aborted since no liver lesion seen on [place] -I recommend GuardantReveal to check circulating tumor DNA, and PET scan for further evaluation . I expressed my concern for cancer recurrence and he voiced good understanding and agrees with the above tests     PLAN: -Biopsy not perform, liver lesion not seen by [place] -Schedule Guardant Review Test next Monday  -PET Scan in 1-2 weeks -follow up after the above tests   Cancer of right colon ([place]) Stage IIIC (pT4a, pN2b, cM0), MSS -diagnosed in 07/2021 -s/p partial colectomy by Dr. Rosendo Gros 07/23/21, final path showed: pT4a lesion with LVI and PNI, no perforation; clear margins; 9/19 positive LNs, pN2b --He received Capox 09/11/21 - 01/15/22, oxali discontinued due to significant nausea/vomiting and headaches. He completed Xeloda in late June 2023.  -on surveillance  -recent CT scan showed new hepatic subsegment VIII lesion just along the margin of the middle hepatic vein near the dome of the RIGHT hemiliver, and enlarging RIGHT upper lobe pulmonary nodule suspicious for metastatic disease. -liver biopsy was attempted but aborted since no liver lesion seen on [place] -I recommend GuardantReveal to check circulating tumor DNA, and PET scan for further evaluation     SUMMARY OF ONCOLOGIC HISTORY: Oncology History  Cancer of right colon ([place])  07/19/2021 Imaging   CT AP IMPRESSION: Focal area of high-grade narrowing of the proximal ascending colon may be sequela of chronic inflammation and stricture versus a mass. There is mild distension of the cecum and terminal ileum. Clinical correlation and further evaluation with lower GI study or colonoscopy recommended.   07/22/2021 Procedure   Colonoscopy A severe stenosis was found in the distal  ascending colon and was non-traversed. It was ulcerated and inflamed. This could be inflammatory versus malignant stricture.   07/22/2021 Initial Biopsy   FINAL MICROSCOPIC DIAGNOSIS:  A.  COLON, ASCENDING, BIOPSY:  - Invasive colorectal adenocarcinoma, moderately differentiated.    07/23/2021 Definitive Surgery   PRE-OPERATIVE DIAGNOSIS:  right colon mass POST-OPERATIVE DIAGNOSIS:  right colon mass PROCEDURE:  Procedure(s): XI ROBOT ASSISTED LAPAROSCOPIC RIGHT COLECTOMY (N/A) Dr. Rosendo Gros   07/23/2021 Pathology Results   FINAL MICROSCOPIC DIAGNOSIS:  A. COLON, RIGHT, RESECTION:  - Invasive moderately differentiated colonic adenocarcinoma, 4.5 cm.  - Carcinoma extends into pericolonic connective tissue and focally  involves serosal surface.  - Margins not involved.  - Metastatic carcinoma in nine of nineteen lymph nodes (9/19).  - Unremarkable appendix. MMR normal, MSI-stable    07/23/2021 Cancer Staging   Staging form: Colon and Rectum, AJCC 8th Edition - Pathologic stage from 07/23/2021: Stage IIIC (pT4a, pN2b, cM0) - Signed by Alla Feeling, NP on 08/15/2021 Stage prefix: Initial diagnosis Histologic grading system: 4 grade system Histologic grade (G): G2 Laterality: Right Lymph-vascular invasion (LVI): LVI present/identified, NOS Perineural invasion (PNI): Present Microsatellite instability (MSI): Stable   07/24/2021 Initial Diagnosis   Colon adenocarcinoma ([place])   07/25/2021 Tumor Marker   CEA: 2.9   07/28/2021 Imaging   CT chest IMPRESSION: 1. No evidence of metastatic disease within the chest. 2. Lungs demonstrate atelectasis, most evident lower lobes and bases of the left upper lobe lingula and right middle lobe. No convincing pneumonia and no evidence of pulmonary edema.     09/11/2021 - 01/15/2022 Chemotherapy   Patient is on Treatment Plan : COLORECTAL Xelox (Capeox) q21d        INTERVAL HISTORY:  Joseph Haney was contacted for a follow up of colon cancer . He was last seen by me on 07/21/2022.  Pt states he has no concerns. He denies having pain.   All other systems were reviewed with the patient and are negative.  MEDICAL HISTORY:   Past Medical History:  Diagnosis Date   Alcohol abuse    Cancer ([place])    GERD (gastroesophageal reflux disease)    Seizure ([place])     SURGICAL HISTORY: Past Surgical History:  Procedure Laterality Date   BIOPSY  07/22/2021   Procedure: BIOPSY;  Surgeon: [name], MD;  [facility];  Service: Gastroenterology;;   COLON SURGERY     COLONOSCOPY WITH PROPOFOL N/A 07/22/2021   Procedure: COLONOSCOPY WITH PROPOFOL;  Surgeon: [name], MD;  [facility];  Service: Gastroenterology;  Laterality: N/A;   NO PAST SURGERIES     SUBMUCOSAL TATTOO INJECTION  07/22/2021   Procedure: SUBMUCOSAL TATTOO INJECTION;  Surgeon: [name], MD;  [facility];  Service: Gastroenterology;;    I have reviewed the social history and family history with the patient and they are unchanged from previous note.  ALLERGIES:  is allergic to morphine and related.  MEDICATIONS:  Current Outpatient Medications  Medication Sig Dispense Refill   carvedilol (COREG) 25 MG tablet Take 1 tablet (25 mg total) by mouth 2 (two) times daily with a meal. 30 tablet 0   cyclobenzaprine (FLEXERIL) 10 MG tablet Take 10-20 mg by mouth 2 (two) times daily as needed for muscle spasms.     dexamethasone (DECADRON) 4 MG tablet Take 1 tablet (4 mg total) by mouth daily. Take for 3-5 days after chemo for nausea and fatigue 20 tablet 0   docusate sodium (COLACE) 100 MG capsule Take 100 mg  by mouth daily.     escitalopram (LEXAPRO) 10 MG tablet Take 10 mg by mouth daily.     ferrous sulfate 325 (65 FE) MG tablet Take 1 tablet (325 mg total) by mouth 2 (two) times daily with a meal. 100 tablet 3   HYDROcodone-acetaminophen (NORCO/VICODIN) 5-325 MG tablet Take 1 tablet by mouth every 6 (six) hours as needed for moderate pain.     ondansetron (ZOFRAN) 4 MG tablet Take 1 tablet (4 mg total) by mouth every 8 (eight) hours as needed for nausea or vomiting. 20 tablet 0   oxyCODONE  (ROXICODONE) 5 MG immediate release tablet Take 1 tablet (5 mg total) by mouth every 6 (six) hours as needed for severe pain. 20 tablet 0   polyethylene glycol (MIRALAX / GLYCOLAX) 17 g packet Take 17 g by mouth daily as needed for mild constipation or moderate constipation. 14 each 0   potassium chloride SA (KLOR-CON M) 20 MEQ tablet Take 1 tablet (20 mEq total) by mouth daily for 5 days. 5 tablet 0   prochlorperazine (COMPAZINE) 10 MG tablet Take 1 tablet (10 mg total) by mouth every 6 (six) hours as needed (Nausea or vomiting). 30 tablet 1   promethazine (PHENERGAN) 25 MG tablet Take 1 tablet (25 mg total) by mouth every 6 (six) hours as needed for nausea or vomiting. 30 tablet 0   triamcinolone cream (KENALOG) 0.1 % Apply 1 Application topically daily as needed (for rash and itching).     No current facility-administered medications for this visit.    PHYSICAL EXAMINATION: ECOG PERFORMANCE STATUS: 0 - Asymptomatic  There were no vitals filed for this visit. Wt Readings from Last 3 Encounters:  08/05/22 192 lb (87.1 kg)  07/21/22 192 lb 14.4 oz (87.5 kg)  05/02/22 185 lb (83.9 kg)     No vitals taken today, Exam not performed today  LABORATORY DATA:  I have reviewed the data as listed    Latest Ref Rng & Units 08/05/2022    7:05 AM 07/18/2022    2:31 PM 05/07/2022    6:08 AM  CBC  WBC 4.0 - 10.5 K/uL 7.1  6.3  7.4   Hemoglobin 13.0 - 17.0 g/dL 13.5  13.8  12.7   Hematocrit 39.0 - 52.0 % 40.0  40.9  38.8   Platelets 150 - 400 K/uL 283  309  269         Latest Ref Rng & Units 07/18/2022    2:31 PM 05/07/2022    6:08 AM 05/05/2022    7:40 AM  CMP  Glucose 70 - 99 mg/dL 108  103  115   BUN 6 - 20 mg/dL _0 Creatinine 0.61 - 1.24 mg/dL 0.71  0.88  0.97   Sodium 135 - 145 mmol/L 138  141  139   Potassium 3.5 - 5.1 mmol/L 3.8  3.2  3.6   Chloride 98 - 111 mmol/L 105  109  107   CO2 22 - 32 mmol/L _1 Calcium 8.9 - 10.3 mg/dL 9.7  8.7  8.8   Total Protein 6.5 -  8.1 g/dL 8.6  8.0  8.1   Total Bilirubin 0.3 - 1.2 mg/dL 0.4  1.1  2.3   Alkaline Phos 38 - 126 U/L 109  186  206   AST 15 - 41 U/L 17  41  93   ALT 0 - 44 U/L 19  94  151  RADIOGRAPHIC STUDIES: I have personally reviewed the radiological images as listed and agreed with the findings in the report. No results found.    Orders Placed This Encounter  Procedures   NM PET Image Initial (PI) Skull Base To Thigh    New liver lesion on CT, biopsy not feasible    Standing Status:   Future    Standing Expiration Date:   08/08/2023    Order Specific Question:   If indicated for the ordered procedure, I authorize the administration of a radiopharmaceutical per Radiology protocol    Answer:   Yes    Order Specific Question:   Preferred imaging location?    Answer:   [facility]   [facility] [address]    GuardantReveal    Standing Status:   Future    Standing Expiration Date:   08/08/2023   All questions were answered. The patient knows to call the clinic with any problems, questions or concerns. No barriers to learning was detected. The total time spent in the appointment was 22 minutes.     Truitt Merle, MD 08/08/2022   Felicity Coyer am acting as scribe for Truitt Merle, MD.   I have reviewed the above documentation for accuracy and completeness, and I agree with the above.

## 2022-08-08 ENCOUNTER — Inpatient Hospital Stay: Payer: Medicaid Other | Attending: Hematology | Admitting: Hematology

## 2022-08-08 ENCOUNTER — Other Ambulatory Visit: Payer: Self-pay

## 2022-08-08 ENCOUNTER — Encounter: Payer: Self-pay | Admitting: Hematology

## 2022-08-08 DIAGNOSIS — C182 Malignant neoplasm of ascending colon: Secondary | ICD-10-CM | POA: Diagnosis not present

## 2022-08-11 ENCOUNTER — Telehealth: Payer: Self-pay | Admitting: Hematology

## 2022-08-11 ENCOUNTER — Inpatient Hospital Stay: Payer: Medicaid Other

## 2022-08-11 DIAGNOSIS — C182 Malignant neoplasm of ascending colon: Secondary | ICD-10-CM

## 2022-08-11 NOTE — Telephone Encounter (Signed)
Called patient to notify of upcoming appointments. Patient notified. 

## 2022-08-14 ENCOUNTER — Ambulatory Visit (HOSPITAL_COMMUNITY): Payer: Medicaid Other

## 2022-08-22 ENCOUNTER — Encounter: Payer: Self-pay | Admitting: Hematology

## 2022-08-22 ENCOUNTER — Ambulatory Visit (HOSPITAL_COMMUNITY)
Admission: RE | Admit: 2022-08-22 | Discharge: 2022-08-22 | Disposition: A | Discharge status: Home / Self Care | Payer: Medicaid Other | Source: Ambulatory Visit | Attending: Hematology | Admitting: Hematology

## 2022-08-22 DIAGNOSIS — C182 Malignant neoplasm of ascending colon: Secondary | ICD-10-CM | POA: Insufficient documentation

## 2022-08-22 DIAGNOSIS — R911 Solitary pulmonary nodule: Secondary | ICD-10-CM | POA: Diagnosis not present

## 2022-08-22 LAB — GLUCOSE, CAPILLARY: Glucose-Capillary: 112 mg/dL — ABNORMAL HIGH (ref 70–99)

## 2022-08-22 MED ORDER — FLUDEOXYGLUCOSE F - 18 (FDG) INJECTION
9.9000 | Freq: Once | INTRAVENOUS | Status: AC
  Administered 2022-08-22: 9.55 via INTRAVENOUS

## 2022-08-25 NOTE — Progress Notes (Deleted)
Joseph Haney   Telephone:(336) [phone] Fax:(336) [phone]   Clinic Follow up Note   Patient Care Team: Erby Pian, [name] as PCP - General (Physician Assistant) Ralene Ok, MD as Consulting Physician (General Surgery) Alla Feeling, NP as Nurse Practitioner (Nurse Practitioner) Truitt Merle, MD as Consulting Physician (Hematology) Mohammed Kindle, MD as Referring Physician (Pain Medicine) 08/25/2022  CHIEF COMPLAINT: Follow up colon cancer   SUMMARY OF ONCOLOGIC HISTORY: Oncology History  Cancer of right colon ([place])  07/19/2021 Imaging   CT AP IMPRESSION: Focal area of high-grade narrowing of the proximal ascending colon may be sequela of chronic inflammation and stricture versus a mass. There is mild distension of the cecum and terminal ileum. Clinical correlation and further evaluation with lower GI study or colonoscopy recommended.   07/22/2021 Procedure   Colonoscopy A severe stenosis was found in the distal ascending colon and was non-traversed. It was ulcerated and inflamed. This could be inflammatory versus malignant stricture.   07/22/2021 Initial Biopsy   FINAL MICROSCOPIC DIAGNOSIS:  A. COLON, ASCENDING, BIOPSY:  - Invasive colorectal adenocarcinoma, moderately differentiated.    07/23/2021 Definitive Surgery   PRE-OPERATIVE DIAGNOSIS:  right colon mass POST-OPERATIVE DIAGNOSIS:  right colon mass PROCEDURE:  Procedure(s): XI ROBOT ASSISTED LAPAROSCOPIC RIGHT COLECTOMY (N/A) Dr. Rosendo Gros   07/23/2021 Pathology Results   FINAL MICROSCOPIC DIAGNOSIS:  A. COLON, RIGHT, RESECTION:  - Invasive moderately differentiated colonic adenocarcinoma, 4.5 cm.  - Carcinoma extends into pericolonic connective tissue and focally  involves serosal surface.  - Margins not involved.  - Metastatic carcinoma in nine of nineteen lymph nodes (9/19).  - Unremarkable appendix. MMR normal, MSI-stable    07/23/2021 Cancer Staging   Staging form: Colon and  Rectum, AJCC 8th Edition - Pathologic stage from 07/23/2021: Stage IIIC (pT4a, pN2b, cM0) - Signed by Alla Feeling, NP on 08/15/2021 Stage prefix: Initial diagnosis Histologic grading system: 4 grade system Histologic grade (G): G2 Laterality: Right Lymph-vascular invasion (LVI): LVI present/identified, NOS Perineural invasion (PNI): Present Microsatellite instability (MSI): Stable   07/24/2021 Initial Diagnosis   Colon adenocarcinoma ([place])   07/25/2021 Tumor Marker   CEA: 2.9   07/28/2021 Imaging   CT chest IMPRESSION: 1. No evidence of metastatic disease within the chest. 2. Lungs demonstrate atelectasis, most evident lower lobes and bases of the left upper lobe lingula and right middle lobe. No convincing pneumonia and no evidence of pulmonary edema.     09/11/2021 - 01/15/2022 Chemotherapy   Patient is on Treatment Plan : COLORECTAL Xelox (Capeox) q21d       CURRENT THERAPY: Adjuvant CAPOX q3 weeks x6 months (goal); completed 09/11/21 - 01/15/22 and oxali discontinued 01/2022  INTERVAL HISTORY: Joseph Haney returns for follow up as scheduled. Last seen by Dr. Burr Medico 08/08/22   REVIEW OF SYSTEMS:   Constitutional: Denies fevers, chills or abnormal weight loss Eyes: Denies blurriness of vision Ears, nose, mouth, throat, and face: Denies mucositis or sore throat Respiratory: Denies cough, dyspnea or wheezes Cardiovascular: Denies palpitation, chest discomfort or lower extremity swelling Gastrointestinal:  Denies nausea, heartburn or change in bowel habits Skin: Denies abnormal skin rashes Lymphatics: Denies new lymphadenopathy or easy bruising Neurological:Denies numbness, tingling or new weaknesses Behavioral/Psych: Mood is stable, no new changes  All other systems were reviewed with the patient and are negative.  MEDICAL HISTORY:  Past Medical History:  Diagnosis Date   Alcohol abuse    Cancer ([place])    GERD (gastroesophageal reflux disease)    Seizure ([place])  SURGICAL HISTORY: Past Surgical History:  Procedure Laterality Date   BIOPSY  07/22/2021   Procedure: BIOPSY;  Surgeon: [name], MD;  Location: [facility];  Service: Gastroenterology;;   COLON SURGERY     COLONOSCOPY WITH PROPOFOL N/A 07/22/2021   Procedure: COLONOSCOPY WITH PROPOFOL;  Surgeon: [name], MD;  Location: [facility];  Service: Gastroenterology;  Laterality: N/A;   NO PAST SURGERIES     SUBMUCOSAL TATTOO INJECTION  07/22/2021   Procedure: SUBMUCOSAL TATTOO INJECTION;  Surgeon: [name], MD;  Location: [facility];  Service: Gastroenterology;;    I have reviewed the social history and family history with the patient and they are unchanged from previous note.  ALLERGIES:  is allergic to morphine and related.  MEDICATIONS:  Current Outpatient Medications  Medication Sig Dispense Refill   carvedilol (COREG) 25 MG tablet Take 1 tablet (25 mg total) by mouth 2 (two) times daily with a meal. 30 tablet 0   cyclobenzaprine (FLEXERIL) 10 MG tablet Take 10-20 mg by mouth 2 (two) times daily as needed for muscle spasms.     dexamethasone (DECADRON) 4 MG tablet Take 1 tablet (4 mg total) by mouth daily. Take for 3-5 days after chemo for nausea and fatigue 20 tablet 0   docusate sodium (COLACE) 100 MG capsule Take 100 mg by mouth daily.     escitalopram (LEXAPRO) 10 MG tablet Take 10 mg by mouth daily.     ferrous sulfate 325 (65 FE) MG tablet Take 1 tablet (325 mg total) by mouth 2 (two) times daily with a meal. 100 tablet 3   HYDROcodone-acetaminophen (NORCO/VICODIN) 5-325 MG tablet Take 1 tablet by mouth every 6 (six) hours as needed for moderate pain.     ondansetron (ZOFRAN) 4 MG tablet Take 1 tablet (4 mg total) by mouth every 8 (eight) hours as needed for nausea or vomiting. 20 tablet 0   oxyCODONE (ROXICODONE) 5 MG immediate release tablet Take 1 tablet (5 mg total) by mouth every 6 (six) hours as needed for severe pain. 20 tablet 0    polyethylene glycol (MIRALAX / GLYCOLAX) 17 g packet Take 17 g by mouth daily as needed for mild constipation or moderate constipation. 14 each 0   potassium chloride SA (KLOR-CON M) 20 MEQ tablet Take 1 tablet (20 mEq total) by mouth daily for 5 days. 5 tablet 0   prochlorperazine (COMPAZINE) 10 MG tablet Take 1 tablet (10 mg total) by mouth every 6 (six) hours as needed (Nausea or vomiting). 30 tablet 1   promethazine (PHENERGAN) 25 MG tablet Take 1 tablet (25 mg total) by mouth every 6 (six) hours as needed for nausea or vomiting. 30 tablet 0   triamcinolone cream (KENALOG) 0.1 % Apply 1 Application topically daily as needed (for rash and itching).     No current facility-administered medications for this visit.    PHYSICAL EXAMINATION: ECOG PERFORMANCE STATUS: {CHL ONC ECOG PS:[id number]}  There were no vitals filed for this visit. There were no vitals filed for this visit.  GENERAL:alert, no distress and comfortable SKIN: skin color, texture, turgor are normal, no rashes or significant lesions EYES: normal, Conjunctiva are pink and non-injected, sclera clear OROPHARYNX:no exudate, no erythema and lips, buccal mucosa, and tongue normal  NECK: supple, thyroid normal size, non-tender, without nodularity LYMPH:  no palpable lymphadenopathy in the cervical, axillary or inguinal LUNGS: clear to auscultation and percussion with normal breathing effort HEART: regular rate & rhythm and no murmurs and no lower extremity edema  ABDOMEN:abdomen soft, non-tender and normal bowel sounds Musculoskeletal:no cyanosis of digits and no clubbing  NEURO: alert & oriented x 3 with fluent speech, no focal motor/sensory deficits  LABORATORY DATA:  I have reviewed the data as listed    Latest Ref Rng & Units 08/05/2022    7:05 AM 07/18/2022    2:31 PM 05/07/2022    6:08 AM  CBC  WBC 4.0 - 10.5 K/uL 7.1  6.3  7.4   Hemoglobin 13.0 - 17.0 g/dL 13.5  13.8  12.7   Hematocrit 39.0 - 52.0 % 40.0  40.9  38.8    Platelets 150 - 400 K/uL 283  309  269         Latest Ref Rng & Units 07/18/2022    2:31 PM 05/07/2022    6:08 AM 05/05/2022    7:40 AM  CMP  Glucose 70 - 99 mg/dL 108  103  115   BUN 6 - 20 mg/dL 12  8  7    Creatinine 0.61 - 1.24 mg/dL 0.71  0.88  0.97   Sodium 135 - 145 mmol/L 138  141  139   Potassium 3.5 - 5.1 mmol/L 3.8  3.2  3.6   Chloride 98 - 111 mmol/L 105  109  107   CO2 22 - 32 mmol/L 24  25  25    Calcium 8.9 - 10.3 mg/dL 9.7  8.7  8.8   Total Protein 6.5 - 8.1 g/dL 8.6  8.0  8.1   Total Bilirubin 0.3 - 1.2 mg/dL 0.4  1.1  2.3   Alkaline Phos 38 - 126 U/L 109  186  206   AST 15 - 41 U/L 17  41  93   ALT 0 - 44 U/L 19  94  151       RADIOGRAPHIC STUDIES: I have personally reviewed the radiological images as listed and agreed with the findings in the report. No results found.   ASSESSMENT & PLAN:  No problem-specific Assessment & Plan notes found for this encounter.   No orders of the defined types were placed in this encounter.  All questions were answered. The patient knows to call the clinic with any problems, questions or concerns. No barriers to learning was detected. I spent {CHL ONC TIME VISIT - DEYCX:[id number]} counseling the patient face to face. The total time spent in the appointment was {CHL ONC TIME VISIT - HFWYO:[id number]} and more than 50% was on counseling and review of test results     Alla Feeling, NP 08/25/22

## 2022-08-26 ENCOUNTER — Other Ambulatory Visit: Payer: Self-pay | Admitting: Hematology

## 2022-08-26 DIAGNOSIS — C182 Malignant neoplasm of ascending colon: Secondary | ICD-10-CM

## 2022-08-26 LAB — GUARDANT 360

## 2022-08-26 MED ORDER — PROCHLORPERAZINE MALEATE 10 MG PO TABS: 10.0000 mg | ORAL_TABLET | Freq: Four times a day (QID) | ORAL | 1 refills | Status: DC | PRN

## 2022-08-26 MED ORDER — ONDANSETRON HCL 8 MG PO TABS: 8.0000 mg | ORAL_TABLET | Freq: Three times a day (TID) | ORAL | 1 refills | Status: DC | PRN

## 2022-08-26 NOTE — Progress Notes (Signed)
DISCONTINUE ON PATHWAY REGIMEN - Colorectal     A cycle is every 21 days:     Capecitabine      Oxaliplatin   **Always confirm dose/schedule in your pharmacy ordering system**  REASON: Other Reason PRIOR TREATMENT: COS82: CapeOx q21 Days x 4 Cycles TREATMENT RESPONSE: Progressive Disease (PD)  START ON PATHWAY REGIMEN - Colorectal     A cycle is every 14 days:     Bevacizumab-xxxx      Irinotecan      Leucovorin      Fluorouracil      Fluorouracil   **Always confirm dose/schedule in your pharmacy ordering system**  Patient Characteristics: Distant Metastases, Nonsurgical Candidate, KRAS/NRAS Mutation Positive/Unknown (BRAF V600 Wild-Type/Unknown), Standard Cytotoxic Therapy, Second Line Standard Cytotoxic Therapy, Bevacizumab Eligible Tumor Location: Colon Therapeutic Status: Distant Metastases Microsatellite/Mismatch Repair Status: MSS/pMMR BRAF Mutation Status: Awaiting Test Results KRAS/NRAS Mutation Status: Awaiting Test Results Preferred Therapy Approach: Standard Cytotoxic Therapy Standard Cytotoxic Line of Therapy: Second Line Standard Cytotoxic Therapy Bevacizumab Eligibility: Eligible Intent of Therapy: Non-Curative / Palliative Intent, Discussed with Patient

## 2022-08-26 NOTE — Progress Notes (Signed)
Per staff message from Dr. Burr Medico, please order a FO on pt's original surgical pathology specimen.  Submitted order electronically to [facility] for [facility] case#[id number] obtained on 07/23/2021.  Faxed order requisition with surgical pathology report and Dr. Ernestina Penna office note to [facility].  Fax confirmation received.  Sent email message to Luellen Pucker and cc Dr. Burr Medico on [place] [phone].

## 2022-08-27 ENCOUNTER — Inpatient Hospital Stay: Payer: Medicaid Other | Admitting: Nurse Practitioner

## 2022-08-27 ENCOUNTER — Other Ambulatory Visit: Payer: Self-pay

## 2022-08-29 ENCOUNTER — Telehealth: Payer: Self-pay | Admitting: Nurse Practitioner

## 2022-08-29 MED FILL — Dexamethasone Sodium Phosphate Inj 100 MG/10ML: INTRAMUSCULAR | Qty: 1 | Status: AC

## 2022-08-29 NOTE — Telephone Encounter (Signed)
Patient aware of upcoming appointments  

## 2022-09-01 ENCOUNTER — Other Ambulatory Visit: Payer: Self-pay

## 2022-09-02 ENCOUNTER — Inpatient Hospital Stay: Payer: Medicaid Other | Attending: Nurse Practitioner | Admitting: Nurse Practitioner

## 2022-09-02 ENCOUNTER — Encounter: Payer: Self-pay | Admitting: Nurse Practitioner

## 2022-09-02 DIAGNOSIS — C787 Secondary malignant neoplasm of liver and intrahepatic bile duct: Secondary | ICD-10-CM | POA: Insufficient documentation

## 2022-09-02 DIAGNOSIS — C182 Malignant neoplasm of ascending colon: Secondary | ICD-10-CM | POA: Insufficient documentation

## 2022-09-02 DIAGNOSIS — R911 Solitary pulmonary nodule: Secondary | ICD-10-CM | POA: Insufficient documentation

## 2022-09-02 NOTE — Progress Notes (Addendum)
[facility]   Telephone:(336) [phone] Fax:(336) [phone]   Clinic Follow up Note   Patient Care Team: Erby Pian, [name] as PCP - General (Physician Assistant) Ralene Ok, MD as Consulting Physician (General Surgery) Alla Feeling, NP as Nurse Practitioner (Nurse Practitioner) Truitt Merle, MD as Consulting Physician (Hematology) Mohammed Kindle, MD as Referring Physician (Pain Medicine) 09/02/2022  I connected with Joseph Haney on 09/02/22 at 10:00 AM EST by telephone and verified that I am speaking with the correct person using two identifiers.   I discussed the limitations, risks, security and privacy concerns of performing an evaluation and management service by telephone and the availability of in person appointments. I also discussed with the patient that there may be a patient responsible charge related to this service. The patient expressed understanding and agreed to proceed.   Other's attending the phone call: patient's friend, Honor Loh   Patient's location:  Home Provider's location:  Home office    CHIEF COMPLAINT: Follow up PET scan results and discuss treatment plan  SUMMARY OF ONCOLOGIC HISTORY: Oncology History  Cancer of right colon ([place])  07/19/2021 Imaging   CT AP IMPRESSION: Focal area of high-grade narrowing of the proximal ascending colon may be sequela of chronic inflammation and stricture versus a mass. There is mild distension of the cecum and terminal ileum. Clinical correlation and further evaluation with lower GI study or colonoscopy recommended.   07/22/2021 Procedure   Colonoscopy A severe stenosis was found in the distal ascending colon and was non-traversed. It was ulcerated and inflamed. This could be inflammatory versus malignant stricture.   07/22/2021 Initial Biopsy   FINAL MICROSCOPIC DIAGNOSIS:  A. COLON, ASCENDING, BIOPSY:  - Invasive colorectal adenocarcinoma, moderately differentiated.     07/23/2021 Definitive Surgery   PRE-OPERATIVE DIAGNOSIS:  right colon mass POST-OPERATIVE DIAGNOSIS:  right colon mass PROCEDURE:  Procedure(s): XI ROBOT ASSISTED LAPAROSCOPIC RIGHT COLECTOMY (N/A) Dr. Rosendo Gros   07/23/2021 Pathology Results   FINAL MICROSCOPIC DIAGNOSIS:  A. COLON, RIGHT, RESECTION:  - Invasive moderately differentiated colonic adenocarcinoma, 4.5 cm.  - Carcinoma extends into pericolonic connective tissue and focally  involves serosal surface.  - Margins not involved.  - Metastatic carcinoma in nine of nineteen lymph nodes (9/19).  - Unremarkable appendix. MMR normal, MSI-stable    07/23/2021 Cancer Staging   Staging form: Colon and Rectum, AJCC 8th Edition - Pathologic stage from 07/23/2021: Stage IIIC (pT4a, pN2b, cM0) - Signed by Alla Feeling, NP on 08/15/2021 Stage prefix: Initial diagnosis Histologic grading system: 4 grade system Histologic grade (G): G2 Laterality: Right Lymph-vascular invasion (LVI): LVI present/identified, NOS Perineural invasion (PNI): Present Microsatellite instability (MSI): Stable   07/24/2021 Initial Diagnosis   Colon adenocarcinoma ([place])   07/25/2021 Tumor Marker   CEA: 2.9   07/28/2021 Imaging   CT chest IMPRESSION: 1. No evidence of metastatic disease within the chest. 2. Lungs demonstrate atelectasis, most evident lower lobes and bases of the left upper lobe lingula and right middle lobe. No convincing pneumonia and no evidence of pulmonary edema.     09/11/2021 - 01/15/2022 Chemotherapy   Patient is on Treatment Plan : COLORECTAL Xelox (Capeox) q21d     09/02/2022 -  Chemotherapy   Patient is on Treatment Plan : COLORECTAL FOLFIRI + Bevacizumab q14d      CURRENT THERAPY: PENDING systemic treatment FOLFIRI/Beva  INTERVAL HISTORY: Joseph Haney presents for phone visit as scheduled. Last seen virtually by Dr. Burr Medico 08/08/22. He underwent guardant reveal testing and PET  scan in the interim. His household had the flu  recently, but he has recovered, otherwise doing pretty good. Energy and appetite are adequate. Bowels moving normally. Denies n/v, pain, or any other specific concerns.    REVIEW OF SYSTEMS:   All other systems were reviewed with the patient and are negative.   MEDICAL HISTORY:  Past Medical History:  Diagnosis Date   Alcohol abuse    Cancer ([place])    GERD (gastroesophageal reflux disease)    Seizure ([place])     SURGICAL HISTORY: Past Surgical History:  Procedure Laterality Date   BIOPSY  07/22/2021   Procedure: BIOPSY;  Surgeon: [name], MD;  [facility];  Service: Gastroenterology;;   COLON SURGERY     COLONOSCOPY WITH PROPOFOL N/A 07/22/2021   Procedure: COLONOSCOPY WITH PROPOFOL;  Surgeon: [name], MD;  [facility];  Service: Gastroenterology;  Laterality: N/A;   NO PAST SURGERIES     SUBMUCOSAL TATTOO INJECTION  07/22/2021   Procedure: SUBMUCOSAL TATTOO INJECTION;  Surgeon: [name], MD;  [facility];  Service: Gastroenterology;;    I have reviewed the social history and family history with the patient and they are unchanged from previous note.  ALLERGIES:  is allergic to morphine and related.  MEDICATIONS:  Current Outpatient Medications  Medication Sig Dispense Refill   carvedilol (COREG) 25 MG tablet Take 1 tablet (25 mg total) by mouth 2 (two) times daily with a meal. 30 tablet 0   cyclobenzaprine (FLEXERIL) 10 MG tablet Take 10-20 mg by mouth 2 (two) times daily as needed for muscle spasms.     dexamethasone (DECADRON) 4 MG tablet Take 1 tablet (4 mg total) by mouth daily. Take for 3-5 days after chemo for nausea and fatigue 20 tablet 0   docusate sodium (COLACE) 100 MG capsule Take 100 mg by mouth daily.     escitalopram (LEXAPRO) 10 MG tablet Take 10 mg by mouth daily.     ferrous sulfate 325 (65 FE) MG tablet Take 1 tablet (325 mg total) by mouth 2 (two) times daily with a meal. 100 tablet 3    HYDROcodone-acetaminophen (NORCO/VICODIN) 5-325 MG tablet Take 1 tablet by mouth every 6 (six) hours as needed for moderate pain.     ondansetron (ZOFRAN) 4 MG tablet Take 1 tablet (4 mg total) by mouth every 8 (eight) hours as needed for nausea or vomiting. 20 tablet 0   ondansetron (ZOFRAN) 8 MG tablet Take 1 tablet (8 mg total) by mouth every 8 (eight) hours as needed for nausea, vomiting or refractory nausea / vomiting. Start on the third day after chemotherapy. 30 tablet 1   oxyCODONE (ROXICODONE) 5 MG immediate release tablet Take 1 tablet (5 mg total) by mouth every 6 (six) hours as needed for severe pain. 20 tablet 0   polyethylene glycol (MIRALAX / GLYCOLAX) 17 g packet Take 17 g by mouth daily as needed for mild constipation or moderate constipation. 14 each 0   potassium chloride SA (KLOR-CON M) 20 MEQ tablet Take 1 tablet (20 mEq total) by mouth daily for 5 days. 5 tablet 0   prochlorperazine (COMPAZINE) 10 MG tablet Take 1 tablet (10 mg total) by mouth every 6 (six) hours as needed (Nausea or vomiting). 30 tablet 1   prochlorperazine (COMPAZINE) 10 MG tablet Take 1 tablet (10 mg total) by mouth every 6 (six) hours as needed for nausea or vomiting. 30 tablet 1   promethazine (PHENERGAN) 25 MG tablet Take 1 tablet (  25 mg total) by mouth every 6 (six) hours as needed for nausea or vomiting. 30 tablet 0   triamcinolone cream (KENALOG) 0.1 % Apply 1 Application topically daily as needed (for rash and itching).     No current facility-administered medications for this visit.    PHYSICAL EXAMINATION: Patient appears well over the phone. Voice is strong, speech is clear. No cough or conversational dyspnea.    LABORATORY DATA:  No lab data for this visit    RADIOGRAPHIC STUDIES: I have personally reviewed the radiological images as listed and agreed with the findings in the report. No results found.   ASSESSMENT AND PLAN: Joseph Haney is a 47 y.o. 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 Rosendo Gros 07/23/21, final path showed: pT4a lesion with LVI and PNI, no perforation; clear margins; 9/19 positive LNs, pN2b --He received Capox 09/11/21 - 01/15/22, oxali discontinued due to significant nausea/vomiting and headaches. He completed Xeloda in late June 2023.  -on surveillance  -CT 07/2022 showed new hepatic subsegment VIII lesion just along the margin of the middle hepatic vein near the dome of the RIGHT hemiliver, and enlarging RIGHT upper lobe pulmonary nodule suspicious for metastatic disease. -liver biopsy was attempted but aborted since no liver lesion seen on [place] -Joseph Haney appears stable/well. We reviewed further work up including [place], which is positive for circulating tumor DNA, and PET scan which showed hypermetabolic liver lesion seen on CT; this is consistent with recurrent liver metastasis. The R lung nodule was not hypermetabolic -The recommendation is to begin systemic chemo with FOLFIRI/bevacizumab q14 days. The rationale, potential benefit, side effects, symptom management, and treatment goal were reviewed.  -We have requested foundation one molecular studies, to see if he is a candidate for immunotherapy or targeted therapy.  -We plan to discuss his case in GI conference this week, to see if he is a candidate for liver surgery or targeted therapy   -He is agreeable to port placement, will ask Dr. Rosendo Gros to place, or IR if he cannot  -After initial agreement and lengthy discussion, Joseph Haney expressed some concern over the recommendations and requested to speak with Dr. Burr Medico. She will call him today.  -If he agrees, plan to start in 2 weeks.   PLAN: -Guardant reveal and PET scan reviewed -Recommend port placement and start FOLFIRI/Beva in 1-2 weeks -Will ask Dr. Rosendo Gros to place port, or IR if not -FO on previous surgical  sample -Discuss case in GI conference this week -Pt requested to speak with Dr. Burr Medico, she will call pt and addend the note  I discussed the assessment and treatment plan with the patient. The patient was provided an opportunity to ask questions and all were answered. The patient agreed with the plan and demonstrated an understanding of the instructions.   The patient was advised to call back or seek an in-person evaluation if the symptoms worsen or if the condition fails to improve as anticipated.  I provided 25 minutes of non face-to-face telephone visit time during this encounter, and > 50% was spent counseling as documented under my assessment & plan.     Alla Feeling, NP 09/02/22   Addendum  I called pt and his mother after Joseph Haney's phone visit with pt. I agree with the above assessment and and plan and have edited the notes.   Given the oligo liver metastasis, and  indeterminate lung nodule, I discussed option of liver surgery or liver targeted therapy such as ablation or radiation after chemotherapy for 3 to 6 months.  Patient is interested in surgery, and would like to see surgeon first before she agrees to do chemotherapy.  I will make a referral to Dr. Zenia Resides and Dr. Barry Dienes at the [facility].  His case will be discussed in our GI tumor board this week or next week. All question were answered.   Truitt Merle MD 09/02/2022

## 2022-09-03 ENCOUNTER — Other Ambulatory Visit: Payer: Self-pay

## 2022-09-03 NOTE — Progress Notes (Signed)
The proposed treatment discussed in conference is for discussion purpose only and is not a binding recommendation.  The patients have not been physically examined, or presented with their treatment options.  Therefore, final treatment plans cannot be decided.  

## 2022-09-08 ENCOUNTER — Other Ambulatory Visit: Payer: Self-pay | Admitting: Licensed Clinical Social Worker

## 2022-09-08 ENCOUNTER — Other Ambulatory Visit: Payer: Self-pay

## 2022-09-08 ENCOUNTER — Other Ambulatory Visit: Payer: Medicaid Other

## 2022-09-08 ENCOUNTER — Inpatient Hospital Stay: Payer: Medicaid Other

## 2022-09-08 ENCOUNTER — Inpatient Hospital Stay (HOSPITAL_BASED_OUTPATIENT_CLINIC_OR_DEPARTMENT_OTHER): Payer: Medicaid Other | Admitting: Licensed Clinical Social Worker

## 2022-09-08 ENCOUNTER — Encounter (HOSPITAL_COMMUNITY): Payer: Self-pay | Admitting: Hematology

## 2022-09-08 DIAGNOSIS — C182 Malignant neoplasm of ascending colon: Secondary | ICD-10-CM

## 2022-09-08 DIAGNOSIS — R911 Solitary pulmonary nodule: Secondary | ICD-10-CM | POA: Diagnosis not present

## 2022-09-08 DIAGNOSIS — C787 Secondary malignant neoplasm of liver and intrahepatic bile duct: Secondary | ICD-10-CM | POA: Diagnosis not present

## 2022-09-08 LAB — CMP (CANCER CENTER ONLY)
ALT: 13 U/L (ref 0–44)
AST: 18 U/L (ref 15–41)
Albumin: 4.6 g/dL (ref 3.5–5.0)
Alkaline Phosphatase: 91 U/L (ref 38–126)
Anion gap: 8 (ref 5–15)
BUN: 10 mg/dL (ref 6–20)
CO2: 24 mmol/L (ref 22–32)
Calcium: 9.7 mg/dL (ref 8.9–10.3)
Chloride: 106 mmol/L (ref 98–111)
Creatinine: 0.78 mg/dL (ref 0.61–1.24)
GFR, Estimated: >60 mL/min (ref >60)
Glucose, Bld: 99 mg/dL (ref 70–99)
Potassium: 3.7 mmol/L (ref 3.5–5.1)
Sodium: 138 mmol/L (ref 135–145)
Total Bilirubin: 0.4 mg/dL (ref 0.3–1.2)
Total Protein: 8 g/dL (ref 6.5–8.1)

## 2022-09-08 LAB — CBC WITH DIFFERENTIAL (CANCER CENTER ONLY)
Abs Immature Granulocytes: 0.01 10*3/uL (ref 0.00–0.07)
Basophils Absolute: 0 10*3/uL (ref 0.0–0.1)
Basophils Relative: 1 %
Eosinophils Absolute: 0.1 10*3/uL (ref 0.0–0.5)
Eosinophils Relative: 2 %
HCT: 38.5 % — ABNORMAL LOW (ref 39.0–52.0)
Hemoglobin: 13 g/dL (ref 13.0–17.0)
Immature Granulocytes: 0 %
Lymphocytes Relative: 41 %
Lymphs Abs: 2.2 10*3/uL (ref 0.7–4.0)
MCH: 28.8 pg (ref 26.0–34.0)
MCHC: 33.8 g/dL (ref 30.0–36.0)
MCV: 85.2 fL (ref 80.0–100.0)
Monocytes Absolute: 0.3 10*3/uL (ref 0.1–1.0)
Monocytes Relative: 6 %
Neutro Abs: 2.7 10*3/uL (ref 1.7–7.7)
Neutrophils Relative %: 50 %
Platelet Count: 321 10*3/uL (ref 150–400)
RBC: 4.52 MIL/uL (ref 4.22–5.81)
RDW: 13.1 % (ref 11.5–15.5)
WBC Count: 5.4 10*3/uL (ref 4.0–10.5)
nRBC: 0 % (ref 0.0–0.2)

## 2022-09-08 LAB — GENETIC SCREENING ORDER

## 2022-09-08 NOTE — Progress Notes (Signed)
REFERRING PROVIDER: Truitt Merle, MD [place],  [place] [address]  PRIMARY PROVIDER:  Erby Pian, [name]  PRIMARY REASON FOR VISIT:  1. Cancer of right colon ([place])      HISTORY OF PRESENT ILLNESS:   Joseph Haney, a 47 y.o. male, was seen for a [place] cancer genetics consultation at the request of Dr. Burr Medico due to a personal history of colon cancer.  Joseph Haney presents to clinic today to discuss the possibility of a hereditary predisposition to cancer, genetic testing, and to further clarify his future cancer risks, as well as potential cancer risks for family members.   CANCER HISTORY:  In 2022, at the age of 44, Joseph Haney was diagnosed with colon cancer. This was treated with partial colectomy, tumor testing showed MMR normal and MSI stable. He had a CT scan in November 2023 that showed a pulmonary nodule suspicious for metastatic disease. 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 Dr. Burr Medico has recommended systemic chemotherapy. Patient plans to meet with surgeon before proceeding with chemotherapy.  Oncology History Overview Note   Cancer Staging  Cancer of right colon [facility]) Staging form: Colon and Rectum, AJCC 8th Edition - Pathologic stage from 07/23/2021: Stage IIIC (pT4a, pN2b, cM0) - Signed by Alla Feeling, NP on 08/15/2021 Stage prefix: Initial diagnosis Histologic grading system: 4 grade system Histologic grade (G): G2 Laterality: Right Lymph-vascular invasion (LVI): LVI present/identified, NOS Perineural invasion (PNI): Present Microsatellite instability (MSI): Stable     Cancer of right colon ([place])  07/19/2021 Imaging   CT AP IMPRESSION: Focal area of high-grade narrowing of the proximal ascending colon may be sequela of chronic inflammation and stricture versus a mass. There is mild distension of the cecum and terminal ileum. Clinical correlation  and further evaluation with lower GI study or colonoscopy recommended.   07/22/2021 Procedure   Colonoscopy A severe stenosis was found in the distal ascending colon and was non-traversed. It was ulcerated and inflamed. This could be inflammatory versus malignant stricture.   07/22/2021 Initial Biopsy   FINAL MICROSCOPIC DIAGNOSIS:  A. COLON, ASCENDING, BIOPSY:  - Invasive colorectal adenocarcinoma, moderately differentiated.    07/23/2021 Definitive Surgery   PRE-OPERATIVE DIAGNOSIS:  right colon mass POST-OPERATIVE DIAGNOSIS:  right colon mass PROCEDURE:  Procedure(s): XI ROBOT ASSISTED LAPAROSCOPIC RIGHT COLECTOMY (N/A) Dr. Rosendo Gros   07/23/2021 Pathology Results   FINAL MICROSCOPIC DIAGNOSIS:  A. COLON, RIGHT, RESECTION:  - Invasive moderately differentiated colonic adenocarcinoma, 4.5 cm.  - Carcinoma extends into pericolonic connective tissue and focally  involves serosal surface.  - Margins not involved.  - Metastatic carcinoma in nine of nineteen lymph nodes (9/19).  - Unremarkable appendix. MMR normal, MSI-stable    07/23/2021 Cancer Staging   Staging form: Colon and Rectum, AJCC 8th Edition - Pathologic stage from 07/23/2021: Stage IIIC (pT4a, pN2b, cM0) - Signed by Alla Feeling, NP on 08/15/2021 Stage prefix: Initial diagnosis Histologic grading system: 4 grade system Histologic grade (G): G2 Laterality: Right Lymph-vascular invasion (LVI): LVI present/identified, NOS Perineural invasion (PNI): Present Microsatellite instability (MSI): Stable   07/24/2021 Initial Diagnosis   Colon adenocarcinoma ([place])   07/25/2021 Tumor Marker   CEA: 2.9   07/28/2021 Imaging   CT chest IMPRESSION: 1. No evidence of metastatic disease within the chest. 2. Lungs demonstrate atelectasis, most evident lower lobes and bases of the left upper lobe lingula and right middle lobe. No convincing pneumonia and no  evidence of pulmonary edema.     09/11/2021 - 01/15/2022  Chemotherapy   Patient is on Treatment Plan : COLORECTAL Xelox ([place]apeox) q21d     08/2022 Progression   PET scan showed hypermetabolic oligo liver metastasis, biopsy attempted but liver lesion was not seen by [place]. GuardantReveal was also positive for circulating tumor DNA    09/02/2022 -  Chemotherapy   Patient is on Treatment Plan : COLORECTAL FOLFIRI + Bevacizumab q14d      Past Medical History:  Diagnosis Date   Alcohol abuse    Cancer ([place])    GERD (gastroesophageal reflux disease)    Seizure ([place])     Past Surgical History:  Procedure Laterality Date   BIOPSY  07/22/2021   Procedure: BIOPSY;  Surgeon: [name], MD;  Location: [facility];  Service: Gastroenterology;;   COLON SURGERY     COLONOSCOPY WITH PROPOFOL N/A 07/22/2021   Procedure: COLONOSCOPY WITH PROPOFOL;  Surgeon: [name], MD;  Location: [facility];  Service: Gastroenterology;  Laterality: N/A;   NO PAST SURGERIES     SUBMUCOSAL TATTOO INJECTION  07/22/2021   Procedure: SUBMUCOSAL TATTOO INJECTION;  Surgeon: [name], MD;  Location: [facility];  Service: Gastroenterology;;    FAMILY HISTORY:  We obtained a detailed, 4-generation family history.  Significant diagnoses are listed below: Family History  Problem Relation Age of Onset   Coronary artery disease Mother    Coronary artery disease Father    Heart attack Father 15   Cancer Cousin 59       prostate   Joseph Haney has 6 brothers, 2 sisters. No cancers. He reports no family history of cancer for his paternal and maternal relatives.   Joseph Haney is unaware of previous family history of genetic testing for hereditary cancer risks. There is no reported Ashkenazi Jewish ancestry. There is no known consanguinity.    GENETIC COUNSELING ASSESSMENT: Joseph Haney is a 47 y.o. male with a personal history of colon cancer initially diagnosed at 53 and now with recurrence which is somewhat suggestive of a hereditary cancer syndrome  and predisposition to cancer. We, therefore, discussed and recommended the following at today's visit.   DISCUSSION: We discussed that approximately 10% of colorectal cancer is hereditary. Most cases of hereditary colorectal cancer are associated with Lynch syndrome genes, although there are other genes associated with hereditary cancer as well. Cancers and risks are gene specific. We discussed that testing is beneficial for several reasons including knowing about cancer risks, identifying potential screening and risk-reduction options that may be appropriate, and to understand if other family members could be at risk for cancer and allow them to undergo genetic testing.   We reviewed the characteristics, features and inheritance patterns of hereditary cancer syndromes. We also discussed genetic testing, including the appropriate family members to test, the process of testing, insurance coverage and turn-around-time for results. We discussed the implications of a negative, positive and/or variant of uncertain significant result. We recommended Joseph Haney pursue genetic testing for the [facility] Multi-Cancer+RNA gene panel.   Based on Joseph Haney personal history of cancer, he meets medical criteria for genetic testing. Despite that he meets criteria, he may still have an out of pocket cost. We discussed that if his out of pocket cost for testing is over $100, the laboratory will call and confirm whether he wants to proceed with testing.  If the out of pocket cost of testing is less than $100 he will be billed by the genetic testing  laboratory.   PLAN: After considering the risks, benefits, and limitations, Joseph Haney provided informed consent to pursue genetic testing and the blood sample was sent to [facility] for analysis of the Multi-Cancer+RNA panel. Results should be available within approximately 2-3 weeks' time, at which point they will be disclosed by telephone to Joseph Haney, as will  any additional recommendations warranted by these results. Joseph Haney will receive a summary of his genetic counseling visit and a copy of his results once available. This information will also be available in Epic.   Joseph Haney questions were answered to his satisfaction today. Our contact information was provided should additional questions or concerns arise. Thank you for the referral and allowing [place] to share in the care of your patient.   Faith Rogue, MS, [facility] Genetic Counselor [email or web address] Phone: [phone]  The patient was seen for a total of 25 minutes in face-to-face genetic counseling.  Dr. Grayland Ormond was available for discussion regarding this case.   _______________________________________________________________________ For Office Staff:  Number of people involved in session: 1 Was an Intern/ student involved with case: no

## 2022-09-10 DIAGNOSIS — C189 Malignant neoplasm of colon, unspecified: Secondary | ICD-10-CM | POA: Diagnosis not present

## 2022-09-10 DIAGNOSIS — C787 Secondary malignant neoplasm of liver and intrahepatic bile duct: Secondary | ICD-10-CM | POA: Diagnosis not present

## 2022-09-10 DIAGNOSIS — K7689 Other specified diseases of liver: Secondary | ICD-10-CM | POA: Diagnosis not present

## 2022-09-12 ENCOUNTER — Other Ambulatory Visit (HOSPITAL_COMMUNITY): Payer: Self-pay

## 2022-09-16 ENCOUNTER — Telehealth: Payer: Self-pay | Admitting: Licensed Clinical Social Worker

## 2022-09-16 ENCOUNTER — Other Ambulatory Visit (HOSPITAL_COMMUNITY): Payer: Self-pay | Admitting: Surgery

## 2022-09-16 DIAGNOSIS — C189 Malignant neoplasm of colon, unspecified: Secondary | ICD-10-CM

## 2022-09-16 DIAGNOSIS — C787 Secondary malignant neoplasm of liver and intrahepatic bile duct: Secondary | ICD-10-CM

## 2022-09-18 ENCOUNTER — Ambulatory Visit: Payer: Self-pay | Admitting: Licensed Clinical Social Worker

## 2022-09-18 ENCOUNTER — Encounter: Payer: Self-pay | Admitting: Hematology

## 2022-09-18 ENCOUNTER — Encounter: Payer: Self-pay | Admitting: Licensed Clinical Social Worker

## 2022-09-18 ENCOUNTER — Encounter: Payer: Self-pay | Admitting: Nurse Practitioner

## 2022-09-18 DIAGNOSIS — Z1379 Encounter for other screening for genetic and chromosomal anomalies: Secondary | ICD-10-CM

## 2022-09-18 NOTE — Telephone Encounter (Signed)
I contacted Mr. Suastegui to discuss his genetic testing results. No pathogenic variants were identified in the 70 genes analyzed. Detailed clinic note to follow.   The test report has been scanned into EPIC and is located under the Molecular Pathology section of the Results Review tab.  A portion of the result report is included below for reference.      Faith Rogue, MS, [facility] Genetic Counselor [email or web address] Phone: [phone]

## 2022-09-18 NOTE — Progress Notes (Signed)
HPI:   Joseph Haney was previously seen in the [place] clinic due to a personal and family history of cancer and concerns regarding a hereditary predisposition to cancer. Please refer to our prior cancer genetics clinic note for more information regarding our discussion, assessment and recommendations, at the time. Joseph Haney recent genetic test results were disclosed to him, as were recommendations warranted by these results. These results and recommendations are discussed in more detail below.  CANCER HISTORY:  Oncology History Overview Note   Cancer Staging  Cancer of right colon [facility]) Staging form: Colon and Rectum, AJCC 8th Edition - Pathologic stage from 07/23/2021: Stage IIIC (pT4a, pN2b, cM0) - Signed by Alla Feeling, NP on 08/15/2021 Stage prefix: Initial diagnosis Histologic grading system: 4 grade system Histologic grade (G): G2 Laterality: Right Lymph-vascular invasion (LVI): LVI present/identified, NOS Perineural invasion (PNI): Present Microsatellite instability (MSI): Stable     Cancer of right colon ([place])  07/19/2021 Imaging   CT AP IMPRESSION: Focal area of high-grade narrowing of the proximal ascending colon may be sequela of chronic inflammation and stricture versus a mass. There is mild distension of the cecum and terminal ileum. Clinical correlation and further evaluation with lower GI study or colonoscopy recommended.   07/22/2021 Procedure   Colonoscopy A severe stenosis was found in the distal ascending colon and was non-traversed. It was ulcerated and inflamed. This could be inflammatory versus malignant stricture.   07/22/2021 Initial Biopsy   FINAL MICROSCOPIC DIAGNOSIS:  A. COLON, ASCENDING, BIOPSY:  - Invasive colorectal adenocarcinoma, moderately differentiated.    07/23/2021 Definitive Surgery   PRE-OPERATIVE DIAGNOSIS:  right colon mass POST-OPERATIVE DIAGNOSIS:  right colon mass PROCEDURE:  Procedure(s): XI ROBOT  ASSISTED LAPAROSCOPIC RIGHT COLECTOMY (N/A) Dr. Rosendo Gros   07/23/2021 Pathology Results   FINAL MICROSCOPIC DIAGNOSIS:  A. COLON, RIGHT, RESECTION:  - Invasive moderately differentiated colonic adenocarcinoma, 4.5 cm.  - Carcinoma extends into pericolonic connective tissue and focally  involves serosal surface.  - Margins not involved.  - Metastatic carcinoma in nine of nineteen lymph nodes (9/19).  - Unremarkable appendix. MMR normal, MSI-stable    07/23/2021 Cancer Staging   Staging form: Colon and Rectum, AJCC 8th Edition - Pathologic stage from 07/23/2021: Stage IIIC (pT4a, pN2b, cM0) - Signed by Alla Feeling, NP on 08/15/2021 Stage prefix: Initial diagnosis Histologic grading system: 4 grade system Histologic grade (G): G2 Laterality: Right Lymph-vascular invasion (LVI): LVI present/identified, NOS Perineural invasion (PNI): Present Microsatellite instability (MSI): Stable   07/24/2021 Initial Diagnosis   Colon adenocarcinoma ([place])   07/25/2021 Tumor Marker   CEA: 2.9   07/28/2021 Imaging   CT chest IMPRESSION: 1. No evidence of metastatic disease within the chest. 2. Lungs demonstrate atelectasis, most evident lower lobes and bases of the left upper lobe lingula and right middle lobe. No convincing pneumonia and no evidence of pulmonary edema.     09/11/2021 - 01/15/2022 Chemotherapy   Patient is on Treatment Plan : COLORECTAL Xelox (Capeox) q21d     08/2022 Progression   PET scan showed hypermetabolic oligo liver metastasis, biopsy attempted but liver lesion was not seen by [place]. GuardantReveal was also positive for circulating tumor DNA    09/02/2022 -  Chemotherapy   Patient is on Treatment Plan : COLORECTAL FOLFIRI + Bevacizumab q14d      Genetic Testing   Negative genetic testing. No pathogenic variants identified on the [facility] Multi-Cancer+RNA panel. VUS in APC called [id number] identified. The report date is 09/15/2022.  The Multi-Cancer + RNA Panel  offered by [facility] includes sequencing and/or deletion/duplication analysis of the following 70 genes:  AIP*, ALK, APC*, ATM*, AXIN2*, BAP1*, BARD1*, BLM*, BMPR1A*, BRCA1*, BRCA2*, BRIP1*, CDC73*, CDH1*, CDK4, CDKN1B*, CDKN2A, CHEK2*, CTNNA1*, DICER1*, EPCAM, EGFR, FH*, FLCN*, GREM1, HOXB13, KIT, LZTR1, MAX*, MBD4, MEN1*, MET, MITF, MLH1*, MSH2*, MSH3*, MSH6*, MUTYH*, NF1*, NF2*, NTHL1*, PALB2*, PDGFRA, PMS2*, POLD1*, POLE*, POT1*, PRKAR1A*, PTCH1*, PTEN*, RAD51C*, RAD51D*, RB1*, RET, SDHA*, SDHAF2*, SDHB*, SDHC*, SDHD*, SMAD4*, SMARCA4*, SMARCB1*, SMARCE1*, STK11*, SUFU*, TMEM127*, TP53*, TSC1*, TSC2*, VHL*. RNA analysis is performed for * genes.     FAMILY HISTORY:  We obtained a detailed, 4-generation family history.  Significant diagnoses are listed below: Family History  Problem Relation Age of Onset   Coronary artery disease Mother    Coronary artery disease Father    Heart attack Father 56   Cancer Cousin 33       prostate    Joseph Haney has 6 brothers, 2 sisters. No cancers. He reports no family history of cancer for his paternal and maternal relatives.    Joseph Haney is unaware of previous family history of genetic testing for hereditary cancer risks. There is no reported Ashkenazi Jewish ancestry. There is no known consanguinity.     GENETIC TEST RESULTS:  The [facility] Multi-Cancer+RNA Panel found no pathogenic mutations.   The Multi-Cancer + RNA Panel offered by [facility] includes sequencing and/or deletion/duplication analysis of the following 70 genes:  AIP*, ALK, APC*, ATM*, AXIN2*, BAP1*, BARD1*, BLM*, BMPR1A*, BRCA1*, BRCA2*, BRIP1*, CDC73*, CDH1*, CDK4, CDKN1B*, CDKN2A, CHEK2*, CTNNA1*, DICER1*, EPCAM, EGFR, FH*, FLCN*, GREM1, HOXB13, KIT, LZTR1, MAX*, MBD4, MEN1*, MET, MITF, MLH1*, MSH2*, MSH3*, MSH6*, MUTYH*, NF1*, NF2*, NTHL1*, PALB2*, PDGFRA, PMS2*, POLD1*, POLE*, POT1*, PRKAR1A*, PTCH1*, PTEN*, RAD51C*, RAD51D*, RB1*, RET, SDHA*, SDHAF2*, SDHB*, SDHC*, SDHD*, SMAD4*,  SMARCA4*, SMARCB1*, SMARCE1*, STK11*, SUFU*, TMEM127*, TP53*, TSC1*, TSC2*, VHL*. RNA analysis is performed for * genes.   The test report has been scanned into EPIC and is located under the Molecular Pathology section of the Results Review tab.  A portion of the result report is included below for reference. Genetic testing reported out on 09/15/2022.      Genetic testing identified a variant of uncertain significance (VUS) in the APC gene called [id number].  At this time, it is unknown if this variant is associated with an increased risk for cancer or if it is benign, but most uncertain variants are reclassified to benign. It should not be used to make medical management decisions. With time, we suspect the laboratory will determine the significance of this variant, if any. If the laboratory reclassifies this variant, we will attempt to contact Joseph Haney to discuss it further.   Even though a pathogenic variant was not identified, possible explanations for the cancer in the family may include: There may be no hereditary risk for cancer in the family. The cancers in Joseph Haney and/or his family may be sporadic/familial or due to other genetic and environmental factors. There may be a gene mutation in one of these genes that current testing methods cannot detect but that chance is small. There could be another gene that has not yet been discovered, or that we have not yet tested, that is responsible for the cancer diagnoses in the family.  It is also possible there is a hereditary cause for the cancer in the family that Joseph Haney did not inherit. The variant of uncertain significance detected in the APC gene may be reclassified as a pathogenic variant  in the future. At this time, we do not know if this variant increases the risk for cancer.  Therefore, it is important to remain in touch with cancer genetics in the future so that we can continue to offer Joseph Haney the most up to date  genetic testing.   ADDITIONAL GENETIC TESTING:  We discussed with Joseph Haney that his genetic testing was fairly extensive.  If there are additional relevant genes identified to increase cancer risk that can be analyzed in the future, we would be happy to discuss and coordinate this testing at that time.    CANCER SCREENING RECOMMENDATIONS:  Joseph Haney test result is considered negative (normal).  This means that we have not identified a hereditary cause for his personal history of cancer at this time.   An individual's cancer risk and medical management are not determined by genetic test results alone. Overall cancer risk assessment incorporates additional factors, including personal medical history, family history, and any available genetic information that may result in a personalized plan for cancer prevention and surveillance. Therefore, it is recommended he continue to follow the cancer management and screening guidelines provided by his oncology and primary healthcare provider.  RECOMMENDATIONS FOR FAMILY MEMBERS:   Since he did not inherit a identifiable mutation in a cancer predisposition gene included on this panel, his children could not have inherited a known mutation from him in one of these genes. Individuals in this family might be at some increased risk of developing cancer, over the general population risk, due to the family history of cancer.  Individuals in the family should notify their providers of the family history of cancer. We recommend women in this family have a yearly mammogram beginning at age 69, or 35 years younger than the earliest onset of cancer, an annual clinical breast exam, and perform monthly breast self-exams.  Family members should have colonoscopies by at age 46, or earlier, as recommended by their providers. We do not recommend familial testing for the APC variant of uncertain significance (VUS).  FOLLOW-UP:  Lastly, we discussed with Joseph Haney that  cancer genetics is a rapidly advancing field and it is possible that new genetic tests will be appropriate for him and/or his family members in the future. We encouraged him to remain in contact with cancer genetics on an annual basis so we can update his personal and family histories and let him know of advances in cancer genetics that may benefit this family.   Our contact number was provided. Joseph Haney questions were answered to his satisfaction, and he knows he is welcome to call us at anytime with additional questions or concerns.    Faith Rogue, MS, [facility] Genetic Counselor [email or web address] Phone: [phone]

## 2022-09-19 ENCOUNTER — Ambulatory Visit (HOSPITAL_COMMUNITY)
Admission: RE | Admit: 2022-09-19 | Discharge: 2022-09-19 | Disposition: A | Discharge status: Home / Self Care | Payer: Medicaid Other | Source: Ambulatory Visit | Attending: Surgery | Admitting: Surgery

## 2022-09-19 DIAGNOSIS — C787 Secondary malignant neoplasm of liver and intrahepatic bile duct: Secondary | ICD-10-CM | POA: Insufficient documentation

## 2022-09-19 DIAGNOSIS — C189 Malignant neoplasm of colon, unspecified: Secondary | ICD-10-CM | POA: Diagnosis not present

## 2022-09-19 DIAGNOSIS — C772 Secondary and unspecified malignant neoplasm of intra-abdominal lymph nodes: Secondary | ICD-10-CM | POA: Diagnosis not present

## 2022-09-19 DIAGNOSIS — C182 Malignant neoplasm of ascending colon: Secondary | ICD-10-CM | POA: Diagnosis not present

## 2022-09-19 MED ORDER — SODIUM CHLORIDE (PF) 0.9 % IJ SOLN
INTRAMUSCULAR | Status: AC
  Filled 2022-09-19: qty 50

## 2022-09-19 MED ORDER — IOHEXOL 300 MG/ML  SOLN
100.0000 mL | Freq: Once | INTRAMUSCULAR | Status: AC | PRN
  Administered 2022-09-19: 100 mL via INTRAVENOUS

## 2022-09-22 ENCOUNTER — Inpatient Hospital Stay: Payer: Medicaid Other | Admitting: Nurse Practitioner

## 2022-10-01 ENCOUNTER — Other Ambulatory Visit: Payer: Self-pay

## 2022-10-03 DIAGNOSIS — C189 Malignant neoplasm of colon, unspecified: Secondary | ICD-10-CM | POA: Diagnosis not present

## 2022-10-03 DIAGNOSIS — C787 Secondary malignant neoplasm of liver and intrahepatic bile duct: Secondary | ICD-10-CM | POA: Diagnosis not present

## 2022-10-07 DIAGNOSIS — Z79891 Long term (current) use of opiate analgesic: Secondary | ICD-10-CM | POA: Diagnosis not present

## 2022-10-07 DIAGNOSIS — G894 Chronic pain syndrome: Secondary | ICD-10-CM | POA: Diagnosis not present

## 2022-10-07 DIAGNOSIS — M542 Cervicalgia: Secondary | ICD-10-CM | POA: Diagnosis not present

## 2022-10-07 DIAGNOSIS — M5136 Other intervertebral disc degeneration, lumbar region: Secondary | ICD-10-CM | POA: Diagnosis not present

## 2022-10-08 ENCOUNTER — Other Ambulatory Visit: Payer: Self-pay

## 2022-10-08 ENCOUNTER — Telehealth: Payer: Self-pay

## 2022-10-08 ENCOUNTER — Ambulatory Visit: Payer: Medicaid Other | Admitting: Hematology

## 2022-10-08 ENCOUNTER — Other Ambulatory Visit: Payer: Self-pay | Admitting: Hematology

## 2022-10-08 DIAGNOSIS — C189 Malignant neoplasm of colon, unspecified: Secondary | ICD-10-CM

## 2022-10-08 DIAGNOSIS — C182 Malignant neoplasm of ascending colon: Secondary | ICD-10-CM

## 2022-10-08 NOTE — Telephone Encounter (Signed)
Called patient to make him aware that he has an appointment with IR at 730 am 2-16 to have a port placed. Patient needs to be NPO after midnight the night before and needs to have a driver for that day. Patient stated he understood and would be here on Friday.

## 2022-10-09 ENCOUNTER — Other Ambulatory Visit: Payer: Self-pay

## 2022-10-09 ENCOUNTER — Ambulatory Visit: Payer: Medicaid Other | Admitting: Hematology

## 2022-10-12 ENCOUNTER — Other Ambulatory Visit: Payer: Self-pay

## 2022-10-15 ENCOUNTER — Other Ambulatory Visit (HOSPITAL_COMMUNITY): Payer: Medicaid Other

## 2022-10-16 ENCOUNTER — Other Ambulatory Visit (HOSPITAL_COMMUNITY): Payer: Self-pay | Admitting: Student

## 2022-10-16 NOTE — H&P (Signed)
Referring Physician(s): [name],[name]  Supervising Physician: Ruthann Cancer  Patient Status:  [facility]  Chief Complaint:  "I'm here for a port a cath"  Subjective: Pt known to IR team from aborted liver lesion bx on 08/05/22 (could not visualize lesion near hepatic dome). He has a hx of colon cancer, s/p right hemicolectomy on 07/23/21. Latest CT Abd on 09/20/22 revealed:   Stable segment 4 liver metastasis compared to the recent PET-CT scan.   Surgical changes from right hemicolectomy and primary anastomosis.   Stable small scattered lymph nodes in the abdomen.   Wall thickening along the stomach with a slightly featureless appearance distally. Please correlate with any symptoms.   Distended gallbladder with stones.  He presents today for port a cath placement to assist with treatment. He denies fever,HA,CP,dyspnea, cough, abd/back pain,N/V or bleeding.   Past Medical History:  Diagnosis Date   Alcohol abuse    Cancer ([place])    GERD (gastroesophageal reflux disease)    Seizure [facility])    Past Surgical History:  Procedure Laterality Date   BIOPSY  07/22/2021   Procedure: BIOPSY;  Surgeon: [name], MD;  Location: [facility] ENDOSCOPY;  Service: Gastroenterology;;   COLON SURGERY     COLONOSCOPY WITH PROPOFOL N/A 07/22/2021   Procedure: COLONOSCOPY WITH PROPOFOL;  Surgeon: [name], MD;  Location: [facility] ENDOSCOPY;  Service: Gastroenterology;  Laterality: N/A;   NO PAST SURGERIES     SUBMUCOSAL TATTOO INJECTION  07/22/2021   Procedure: SUBMUCOSAL TATTOO INJECTION;  Surgeon: [name], MD;  Location: [facility] ENDOSCOPY;  Service: Gastroenterology;;      Allergies: Morphine and related  Medications: Prior to Admission medications   Medication Sig Start Date End Date Taking? Authorizing Provider  carvedilol (COREG) 25 MG tablet Take 1 tablet (25 mg total) by mouth 2 (two) times daily with a meal. 01/15/22   Truitt Merle, MD  cyclobenzaprine (FLEXERIL) 10 MG tablet  Take 10-20 mg by mouth 2 (two) times daily as needed for muscle spasms. 06/25/21   [provider]  dexamethasone (DECADRON) 4 MG tablet Take 1 tablet (4 mg total) by mouth daily. Take for 3-5 days after chemo for nausea and fatigue 11/29/21   Truitt Merle, MD  docusate sodium (COLACE) 100 MG capsule Take 100 mg by mouth daily.    [provider]  escitalopram (LEXAPRO) 10 MG tablet Take 10 mg by mouth daily. 04/29/22   [provider]  ferrous sulfate 325 (65 FE) MG tablet Take 1 tablet (325 mg total) by mouth 2 (two) times daily with a meal. 08/01/21   [name], [name], MD  HYDROcodone-acetaminophen (NORCO/VICODIN) 5-325 MG tablet Take 1 tablet by mouth every 6 (six) hours as needed for moderate pain.    [provider]  ondansetron (ZOFRAN) 4 MG tablet Take 1 tablet (4 mg total) by mouth every 8 (eight) hours as needed for nausea or vomiting. 04/03/22   Dorothyann Peng, [name]  oxyCODONE (ROXICODONE) 5 MG immediate release tablet Take 1 tablet (5 mg total) by mouth every 6 (six) hours as needed for severe pain. 04/03/22   Dorothyann Peng, [name]  polyethylene glycol (MIRALAX / GLYCOLAX) 17 g packet Take 17 g by mouth daily as needed for mild constipation or moderate constipation. 08/01/21   [name], [name], MD  potassium chloride SA (KLOR-CON M) 20 MEQ tablet Take 1 tablet (20 mEq total) by mouth daily for 5 days. 05/07/22 05/12/22  [name], MD  prochlorperazine (COMPAZINE) 10 MG tablet Take 1  tablet (10 mg total) by mouth every 6 (six) hours as needed (Nausea or vomiting). 11/29/21   Truitt Merle, MD  promethazine (PHENERGAN) 25 MG tablet Take 1 tablet (25 mg total) by mouth every 6 (six) hours as needed for nausea or vomiting. 11/29/21   Truitt Merle, MD  triamcinolone cream (KENALOG) 0.1 % Apply 1 Application topically daily as needed (for rash and itching). 04/29/22   [provider]     Vital Signs: Vitals:   10/17/22 0745  BP: 118/74  Pulse: 88  Resp: 18  Temp:  98.5 F (36.9 C)  SpO2: 97%     Code Status: FULL CODE   Physical Exam :awake/alert; chest- CTA bilat; heart- RRR; abd- soft,+BS,NT; no LE edema  Imaging: No results found.  Labs:  CBC: Recent Labs    05/07/22 0608 07/18/22 1431 08/05/22 0705 09/08/22 1328  WBC 7.4 6.3 7.1 5.4  HGB 12.7* 13.8 13.5 13.0  HCT 38.8* 40.9 40.0 38.5*  PLT 269 309 283 321    COAGS: Recent Labs    08/05/22 0705  INR 1.0    BMP: Recent Labs    05/05/22 0740 05/07/22 0608 07/18/22 1431 09/08/22 1328  NA 139 141 138 138  K 3.6 3.2* 3.8 3.7  CL 107 109 105 106  CO2 25 25 24 24  $ GLUCOSE 115* 103* 108* 99  BUN 7 8 12 10  $ CALCIUM 8.8* 8.7* 9.7 9.7  CREATININE 0.97 0.88 0.71 0.78  GFRNONAA >60 >60 >60 >60    LIVER FUNCTION TESTS: Recent Labs    05/05/22 0740 05/07/22 0608 07/18/22 1431 09/08/22 1328  BILITOT 2.3* 1.1 0.4 0.4  AST 93* 41 17 18  ALT 151* 94* 19 13  ALKPHOS 206* 186* 109 91  PROT 8.1 8.0 8.6* 8.0  ALBUMIN 3.4* 3.5 4.5 4.6    Assessment and Plan: Pt known to IR team from aborted liver lesion bx on 08/05/22 (could not visualize lesion near hepatic dome). He has a hx of colon cancer, s/p right hemicolectomy on 07/23/21. PMH also sig for alcohol abuse, GERD, seizures. Latest CT Abd on 09/20/22 revealed:   Stable segment 4 liver metastasis compared to the recent PET-CT scan.   Surgical changes from right hemicolectomy and primary anastomosis.   Stable small scattered lymph nodes in the abdomen.   Wall thickening along the stomach with a slightly featureless appearance distally. Please correlate with any symptoms.   Distended gallbladder with stones.  He presents today for port a cath placement to assist with treatment. Risks and benefits of image guided port-a-catheter placement was discussed with the patient including, but not limited to bleeding, infection, pneumothorax, or fibrin sheath development and need for additional procedures.  All of the  patient's questions were answered, patient is agreeable to proceed. Consent signed and in chart.    Electronically Signed: D. Rowe Robert, [name] 10/16/2022, 7:41 PM   I spent a total of 25 Minutes at the the patient's bedside AND on the patient's hospital floor or unit, greater than 50% of which was counseling/coordinating care for port a cath placement

## 2022-10-17 ENCOUNTER — Ambulatory Visit (HOSPITAL_COMMUNITY)
Admission: RE | Admit: 2022-10-17 | Discharge: 2022-10-17 | Disposition: A | Discharge status: Home / Self Care | Payer: Medicaid Other | Source: Ambulatory Visit | Attending: Hematology | Admitting: Hematology

## 2022-10-17 ENCOUNTER — Encounter (HOSPITAL_COMMUNITY): Payer: Self-pay

## 2022-10-17 DIAGNOSIS — C189 Malignant neoplasm of colon, unspecified: Secondary | ICD-10-CM | POA: Diagnosis not present

## 2022-10-17 DIAGNOSIS — C787 Secondary malignant neoplasm of liver and intrahepatic bile duct: Secondary | ICD-10-CM | POA: Insufficient documentation

## 2022-10-17 DIAGNOSIS — C182 Malignant neoplasm of ascending colon: Secondary | ICD-10-CM | POA: Diagnosis present

## 2022-10-17 DIAGNOSIS — K8020 Calculus of gallbladder without cholecystitis without obstruction: Secondary | ICD-10-CM | POA: Insufficient documentation

## 2022-10-17 DIAGNOSIS — Z452 Encounter for adjustment and management of vascular access device: Secondary | ICD-10-CM | POA: Diagnosis not present

## 2022-10-17 HISTORY — PX: IR IMAGING GUIDED PORT INSERTION: IMG5740

## 2022-10-17 MED ORDER — SODIUM CHLORIDE 0.9 % IV SOLN: INTRAVENOUS | Status: DC

## 2022-10-17 MED ORDER — LIDOCAINE-EPINEPHRINE 1 %-1:100000 IJ SOLN
INTRAMUSCULAR | Status: AC
  Administered 2022-10-17: 20 mL via INTRADERMAL
  Filled 2022-10-17: qty 1

## 2022-10-17 MED ORDER — HEPARIN SOD (PORK) LOCK FLUSH 100 UNIT/ML IV SOLN
INTRAVENOUS | Status: AC
  Administered 2022-10-17: 500 [IU] via INTRAVENOUS
  Filled 2022-10-17: qty 5

## 2022-10-17 MED ORDER — FENTANYL CITRATE (PF) 100 MCG/2ML IJ SOLN
INTRAMUSCULAR | Status: AC | PRN
  Administered 2022-10-17: 25 ug via INTRAVENOUS
  Administered 2022-10-17: 75 ug via INTRAVENOUS

## 2022-10-17 MED ORDER — MIDAZOLAM HCL 2 MG/2ML IJ SOLN
INTRAMUSCULAR | Status: AC | PRN
  Administered 2022-10-17: .5 mg via INTRAVENOUS
  Administered 2022-10-17: 1 mg via INTRAVENOUS
  Administered 2022-10-17: 1.5 mg via INTRAVENOUS
  Administered 2022-10-17: 1 mg via INTRAVENOUS

## 2022-10-17 MED ORDER — MIDAZOLAM HCL 2 MG/2ML IJ SOLN
INTRAMUSCULAR | Status: AC
  Filled 2022-10-17: qty 2

## 2022-10-17 MED ORDER — DIPHENHYDRAMINE HCL 50 MG/ML IJ SOLN
INTRAMUSCULAR | Status: AC
  Filled 2022-10-17: qty 1

## 2022-10-17 MED ORDER — FENTANYL CITRATE (PF) 100 MCG/2ML IJ SOLN
INTRAMUSCULAR | Status: AC
  Filled 2022-10-17: qty 2

## 2022-10-17 MED FILL — Dexamethasone Sodium Phosphate Inj 100 MG/10ML: INTRAMUSCULAR | Qty: 1 | Status: AC

## 2022-10-17 NOTE — Discharge Instructions (Signed)
Please call Interventional Radiology clinic [phone] with any questions or concerns.  You may remove your dressing and shower tomorrow.  DO NOT use EMLA cream on your port site for 2 weeks as this cream will remove surgical glue on your incision.  Implanted Port Insertion, Care After This sheet gives you information about how to care for yourself after your procedure. Your health care provider may also give you more specific instructions. If you have problems or questions, contact your health care provider. What can I expect after the procedure? After the procedure, it is common to have: Discomfort at the port insertion site. Bruising on the skin over the port. This should improve over 3-4 days. Follow these instructions at home: [facility] care After your port is placed, you will get a manufacturer's information card. The card has information about your port. Keep this card with you at all times. Take care of the port as told by your health care provider. Ask your health care provider if you or a family member can get training for taking care of the port at home. A home health care nurse may also take care of the port. Make sure to remember what type of port you have. Incision care Follow instructions from your health care provider about how to take care of your port insertion site. Make sure you: Wash your hands with soap and water before and after you change your bandage (dressing). If soap and water are not available, use hand sanitizer. Change your dressing as told by your health care provider. Leave stitches (sutures), skin glue, or adhesive strips in place. These skin closures may need to stay in place for 2 weeks or longer. If adhesive strip edges start to loosen and curl up, you may trim the loose edges. Do not remove adhesive strips completely unless your health care provider tells you to do that. Check your port insertion site every day for signs of infection. Check for: Redness,  swelling, or pain. Fluid or blood. Warmth. Pus or a bad smell.        Activity Return to your normal activities as told by your health care provider. Ask your health care provider what activities are safe for you. Do not lift anything that is heavier than 10 lb (4.5 kg), or the limit that you are told, until your health care provider says that it is safe. General instructions Take over-the-counter and prescription medicines only as told by your health care provider. Do not take baths, swim, or use a hot tub until your health care provider approves. Ask your health care provider if you may take showers. You may only be allowed to take sponge baths. Do not drive for 24 hours if you were given a sedative during your procedure. Wear a medical alert bracelet in case of an emergency. This will tell any health care providers that you have a port. Keep all follow-up visits as told by your health care provider. This is important. Contact a health care provider if: You cannot flush your port with saline as directed, or you cannot draw blood from the port. You have a fever or chills. You have redness, swelling, or pain around your port insertion site. You have fluid or blood coming from your port insertion site. Your port insertion site feels warm to the touch. You have pus or a bad smell coming from the port insertion site. Get help right away if: You have chest pain or shortness of breath. You have bleeding from  your port that you cannot control. Summary Take care of the port as told by your health care provider. Keep the manufacturer's information card with you at all times. Change your dressing as told by your health care provider. Contact a health care provider if you have a fever or chills or if you have redness, swelling, or pain around your port insertion site. Keep all follow-up visits as told by your health care provider. This information is not intended to replace advice given to you  by your health care provider. Make sure you discuss any questions you have with your health care provider. Document Revised: 03/16/2018 Document Reviewed: 03/16/2018 Elsevier Patient Education  2021 [place].   Moderate Conscious Sedation, Adult, Care After This sheet gives you information about how to care for yourself after your procedure. Your health care provider may also give you more specific instructions. If you have problems or questions, contact your health care provider. What can I expect after the procedure? After the procedure, it is common to have: Sleepiness for several hours. Impaired judgment for several hours. Difficulty with balance. Vomiting if you eat too soon. Follow these instructions at home: For the time period you were told by your health care provider: Rest. Do not participate in activities where you could fall or become injured. Do not drive or use machinery. Do not drink alcohol. Do not take sleeping pills or medicines that cause drowsiness. Do not make important decisions or sign legal documents. Do not take care of children on your own.        Eating and drinking Follow the diet recommended by your health care provider. Drink enough fluid to keep your urine pale yellow. If you vomit: Drink water, juice, or soup when you can drink without vomiting. Make sure you have little or no nausea before eating solid foods.    General instructions Take over-the-counter and prescription medicines only as told by your health care provider. Have a responsible adult stay with you for the time you are told. It is important to have someone help care for you until you are awake and alert. Do not smoke. Keep all follow-up visits as told by your health care provider. This is important. Contact a health care provider if: You are still sleepy or having trouble with balance after 24 hours. You feel light-headed. You keep feeling nauseous or you keep vomiting. You  develop a rash. You have a fever. You have redness or swelling around the IV site. Get help right away if: You have trouble breathing. You have new-onset confusion at home. Summary After the procedure, it is common to feel sleepy, have impaired judgment, or feel nauseous if you eat too soon. Rest after you get home. Know the things you should not do after the procedure. Follow the diet recommended by your health care provider and drink enough fluid to keep your urine pale yellow. Get help right away if you have trouble breathing or new-onset confusion at home. This information is not intended to replace advice given to you by your health care provider. Make sure you discuss any questions you have with your health care provider. Document Revised: 12/16/2019 Document Reviewed: 07/14/2019 Elsevier Patient Education  2021 [employer].

## 2022-10-17 NOTE — Procedures (Signed)
Interventional Radiology Procedure Note ° °Procedure: Single Lumen Power Port Placement   ° °Access:  Right internal jugular vein ° °Findings: Catheter tip positioned at cavoatrial junction. Port is ready for immediate use.  ° °Complications: None ° °EBL: < 10 mL ° °Recommendations:  °- Ok to shower in 24 hours °- Do not submerge for 7 days °- Routine line care  ° ° °Sharmon Cheramie, MD ° ° ° °

## 2022-10-19 ENCOUNTER — Other Ambulatory Visit: Payer: Self-pay

## 2022-10-19 NOTE — Assessment & Plan Note (Signed)
Stage IIIC (pT4a, pN2b, cM0), MSS -diagnosed in 07/2021 -s/p partial colectomy by Dr. Rosendo Gros 07/23/21, final path showed: pT4a lesion with LVI and PNI, no perforation; clear margins; 9/19 positive LNs, pN2b --He received Capox 09/11/21 - 01/15/22, oxali discontinued due to significant nausea/vomiting and headaches. He completed Xeloda in late June 2023.  -He unfortunately developed oligo liver metastasis based on PET/CT in 08/2022, liver biopsy was attempted but did not feasible.  -Guardian review was positive for circulating tumor DNA, consistent with recurrence. -Patient requested second opinion of surgery, was seen at [facility].  Neoadjuvant chemotherapy recommended before surgical resection, and he agreed. -Plan to start chemotherapy for FOLFIRI due to her rapid recurrence (within 6 months of completion of adjuvant chemo), chemo consent obtained today. He will proceed first cycle today.  -will request NGS of [employer] to see if he is candidate for targeted therapy.  Given the primary tumor was on the right side, he would not benefit from EGFR inhibitor much even if he has KRAS/NRAS/BRAF wild type.

## 2022-10-20 ENCOUNTER — Encounter: Payer: Self-pay | Admitting: Hematology

## 2022-10-20 ENCOUNTER — Inpatient Hospital Stay (HOSPITAL_BASED_OUTPATIENT_CLINIC_OR_DEPARTMENT_OTHER): Payer: Medicaid Other | Admitting: Hematology

## 2022-10-20 ENCOUNTER — Inpatient Hospital Stay: Payer: Medicaid Other | Attending: Nurse Practitioner

## 2022-10-20 ENCOUNTER — Other Ambulatory Visit: Payer: Self-pay

## 2022-10-20 ENCOUNTER — Inpatient Hospital Stay: Payer: Medicaid Other

## 2022-10-20 VITALS — BP 130/87 | HR 85 | Temp 98.3°F | Resp 16 | Ht 69.0 in | Wt 184.0 lb

## 2022-10-20 DIAGNOSIS — C182 Malignant neoplasm of ascending colon: Secondary | ICD-10-CM

## 2022-10-20 DIAGNOSIS — Z5111 Encounter for antineoplastic chemotherapy: Secondary | ICD-10-CM | POA: Diagnosis not present

## 2022-10-20 DIAGNOSIS — C787 Secondary malignant neoplasm of liver and intrahepatic bile duct: Secondary | ICD-10-CM | POA: Insufficient documentation

## 2022-10-20 DIAGNOSIS — Z79899 Other long term (current) drug therapy: Secondary | ICD-10-CM | POA: Diagnosis not present

## 2022-10-20 LAB — CMP (CANCER CENTER ONLY)
ALT: 28 U/L (ref 0–44)
AST: 16 U/L (ref 15–41)
Albumin: 4 g/dL (ref 3.5–5.0)
Alkaline Phosphatase: 76 U/L (ref 38–126)
Anion gap: 8 (ref 5–15)
BUN: 20 mg/dL (ref 6–20)
CO2: 24 mmol/L (ref 22–32)
Calcium: 9 mg/dL (ref 8.9–10.3)
Chloride: 105 mmol/L (ref 98–111)
Creatinine: 0.86 mg/dL (ref 0.61–1.24)
GFR, Estimated: >60 mL/min (ref >60)
Glucose, Bld: 107 mg/dL — ABNORMAL HIGH (ref 70–99)
Potassium: 3.7 mmol/L (ref 3.5–5.1)
Sodium: 137 mmol/L (ref 135–145)
Total Bilirubin: 0.3 mg/dL (ref 0.3–1.2)
Total Protein: 7.8 g/dL (ref 6.5–8.1)

## 2022-10-20 LAB — CBC WITH DIFFERENTIAL (CANCER CENTER ONLY)
Abs Immature Granulocytes: 0.1 10*3/uL — ABNORMAL HIGH (ref 0.00–0.07)
Basophils Absolute: 0 10*3/uL (ref 0.0–0.1)
Basophils Relative: 0 %
Eosinophils Absolute: 0.1 10*3/uL (ref 0.0–0.5)
Eosinophils Relative: 1 %
HCT: 38 % — ABNORMAL LOW (ref 39.0–52.0)
Hemoglobin: 12.8 g/dL — ABNORMAL LOW (ref 13.0–17.0)
Immature Granulocytes: 1 %
Lymphocytes Relative: 16 %
Lymphs Abs: 2 10*3/uL (ref 0.7–4.0)
MCH: 28.8 pg (ref 26.0–34.0)
MCHC: 33.7 g/dL (ref 30.0–36.0)
MCV: 85.6 fL (ref 80.0–100.0)
Monocytes Absolute: 0.7 10*3/uL (ref 0.1–1.0)
Monocytes Relative: 6 %
Neutro Abs: 9 10*3/uL — ABNORMAL HIGH (ref 1.7–7.7)
Neutrophils Relative %: 76 %
Platelet Count: 322 10*3/uL (ref 150–400)
RBC: 4.44 MIL/uL (ref 4.22–5.81)
RDW: 12.7 % (ref 11.5–15.5)
WBC Count: 11.9 10*3/uL — ABNORMAL HIGH (ref 4.0–10.5)
nRBC: 0 % (ref 0.0–0.2)

## 2022-10-20 MED ORDER — FLUOROURACIL CHEMO INJECTION 2.5 GM/50ML
400.0000 mg/m2 | Freq: Once | INTRAVENOUS | Status: AC
  Administered 2022-10-20: 800 mg via INTRAVENOUS
  Filled 2022-10-20: qty 16

## 2022-10-20 MED ORDER — SODIUM CHLORIDE 0.9 % IV SOLN
2400.0000 mg/m2 | INTRAVENOUS | Status: DC
  Administered 2022-10-20: 5000 mg via INTRAVENOUS
  Filled 2022-10-20: qty 100

## 2022-10-20 MED ORDER — SODIUM CHLORIDE 0.9 % IV SOLN: Freq: Once | INTRAVENOUS | Status: AC

## 2022-10-20 MED ORDER — SODIUM CHLORIDE 0.9 % IV SOLN
10.0000 mg | Freq: Once | INTRAVENOUS | Status: AC
  Administered 2022-10-20: 10 mg via INTRAVENOUS
  Filled 2022-10-20: qty 10

## 2022-10-20 MED ORDER — ONDANSETRON HCL 8 MG PO TABS: 8.0000 mg | ORAL_TABLET | Freq: Three times a day (TID) | ORAL | 1 refills | Status: DC | PRN

## 2022-10-20 MED ORDER — PROCHLORPERAZINE MALEATE 10 MG PO TABS: 10.0000 mg | ORAL_TABLET | Freq: Four times a day (QID) | ORAL | 1 refills | Status: DC | PRN

## 2022-10-20 MED ORDER — PALONOSETRON HCL INJECTION 0.25 MG/5ML
0.2500 mg | Freq: Once | INTRAVENOUS | Status: AC
  Administered 2022-10-20: 0.25 mg via INTRAVENOUS
  Filled 2022-10-20: qty 5

## 2022-10-20 MED ORDER — SODIUM CHLORIDE 0.9 % IV SOLN
180.0000 mg/m2 | Freq: Once | INTRAVENOUS | Status: AC
  Administered 2022-10-20: 400 mg via INTRAVENOUS
  Filled 2022-10-20: qty 5

## 2022-10-20 MED ORDER — SODIUM CHLORIDE 0.9 % IV SOLN
400.0000 mg/m2 | Freq: Once | INTRAVENOUS | Status: AC
  Administered 2022-10-20: 804 mg via INTRAVENOUS
  Filled 2022-10-20: qty 40.2

## 2022-10-20 MED ORDER — ATROPINE SULFATE 1 MG/ML IV SOLN
0.5000 mg | Freq: Once | INTRAVENOUS | Status: AC | PRN
  Administered 2022-10-20: 0.5 mg via INTRAVENOUS
  Filled 2022-10-20: qty 1

## 2022-10-20 MED ORDER — LIDOCAINE-PRILOCAINE 2.5-2.5 % EX CREA: TOPICAL_CREAM | CUTANEOUS | 3 refills | Status: DC

## 2022-10-20 NOTE — Progress Notes (Signed)
[facility]   [phone] [phone]   Clinic Follow up Note   Patient Care Team: Erby Pian, [name] as PCP - General (Physician Assistant) Ralene Ok, MD as Consulting Physician (General Surgery) Alla Feeling, NP as Nurse Practitioner (Nurse Practitioner) Truitt Merle, MD as Consulting Physician (Hematology) Mohammed Kindle, MD as Referring Physician (Pain Medicine)  Date of Service:  10/20/2022  CHIEF COMPLAINT: f/u of colon cancer    CURRENT THERAPY:  Colorectal FOLFIRI q14d  ASSESSMENT:  Joseph Haney is a 47 y.o. 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 Rosendo Gros 07/23/21, final path showed: pT4a lesion with LVI and PNI, no perforation; clear margins; 9/19 positive LNs, pN2b --He received Capox 09/11/21 - 01/15/22, oxali discontinued due to significant nausea/vomiting and headaches. He completed Xeloda in late June 2023.  -He unfortunately developed oligo liver metastasis based on PET/CT in 08/2022, liver biopsy was attempted but did not feasible.  -Guardian review was positive for circulating tumor DNA, consistent with recurrence. -Patient requested second opinion of surgery, was seen at [facility]  Neoadjuvant chemotherapy recommended before surgical resection, and he agreed. -Plan to start chemotherapy for FOLFIRI due to her rapid recurrence (within 6 months of completion of adjuvant chemo), chemo consent obtained today. He will proceed first cycle today.  -will request NGS of [place] to see if he is candidate for targeted therapy.  Given the primary tumor was on the right side, he would not benefit from EGFR inhibitor much even if he has KRAS/NRAS/BRAF wild type.  But if he has BRAF mutation, then I will change chemo to [facility]      PLAN: -discuss FOLFIRI and it side effects. -lab reviewed -Use CT scan from January as a base line -Repeat   CT scan in 3 months to decide surgery -Proceed with D1,C1 FOLFIRI today -I called in Zofran, Compazine, and Emla cream, he will buy over-the-counter Imodium -lab, flush, fu and FOLFIRI 11/03/2022   SUMMARY OF ONCOLOGIC HISTORY: Oncology History Overview Note   Cancer Staging  Cancer of right colon [facility]) Staging form: Colon and Rectum, AJCC 8th Edition - Pathologic stage from 07/23/2021: Stage IIIC (pT4a, pN2b, cM0) - Signed by Alla Feeling, NP on 08/15/2021 Stage prefix: Initial diagnosis Histologic grading system: 4 grade system Histologic grade (G): G2 Laterality: Right Lymph-vascular invasion (LVI): LVI present/identified, NOS Perineural invasion (PNI): Present Microsatellite instability (MSI): Stable     Cancer of right colon ([place])  07/19/2021 Imaging   CT AP IMPRESSION: Focal area of high-grade narrowing of the proximal ascending colon may be sequela of chronic inflammation and stricture versus a mass. There is mild distension of the cecum and terminal ileum. Clinical correlation and further evaluation with lower GI study or colonoscopy recommended.   07/22/2021 Procedure   Colonoscopy A severe stenosis was found in the distal ascending colon and was non-traversed. It was ulcerated and inflamed. This could be inflammatory versus malignant stricture.   07/22/2021 Initial Biopsy   FINAL MICROSCOPIC DIAGNOSIS:  A. COLON, ASCENDING, BIOPSY:  - Invasive colorectal adenocarcinoma, moderately differentiated.    07/23/2021 Definitive Surgery   PRE-OPERATIVE DIAGNOSIS:  right colon mass POST-OPERATIVE DIAGNOSIS:  right colon mass PROCEDURE:  Procedure(s): XI ROBOT ASSISTED LAPAROSCOPIC RIGHT COLECTOMY (N/A) Dr. Rosendo Gros   07/23/2021 Pathology Results   FINAL MICROSCOPIC DIAGNOSIS:  A. COLON, RIGHT, RESECTION:  - Invasive moderately differentiated colonic adenocarcinoma, 4.5 cm.  - Carcinoma  extends into pericolonic connective tissue and focally  involves serosal  surface.  - Margins not involved.  - Metastatic carcinoma in nine of nineteen lymph nodes (9/19).  - Unremarkable appendix. MMR normal, MSI-stable    07/23/2021 Cancer Staging   Staging form: Colon and Rectum, AJCC 8th Edition - Pathologic stage from 07/23/2021: Stage IIIC (pT4a, pN2b, cM0) - Signed by Alla Feeling, NP on 08/15/2021 Stage prefix: Initial diagnosis Histologic grading system: 4 grade system Histologic grade (G): G2 Laterality: Right Lymph-vascular invasion (LVI): LVI present/identified, NOS Perineural invasion (PNI): Present Microsatellite instability (MSI): Stable   07/24/2021 Initial Diagnosis   Colon adenocarcinoma ([place])   07/25/2021 Tumor Marker   CEA: 2.9   07/28/2021 Imaging   CT chest IMPRESSION: 1. No evidence of metastatic disease within the chest. 2. Lungs demonstrate atelectasis, most evident lower lobes and bases of the left upper lobe lingula and right middle lobe. No convincing pneumonia and no evidence of pulmonary edema.     09/11/2021 - 01/15/2022 Chemotherapy   Patient is on Treatment Plan : COLORECTAL Xelox ([place]ox) q21d     08/2022 Progression   PET scan showed hypermetabolic oligo liver metastasis, biopsy attempted but liver lesion was not seen by [place]. GuardantReveal was also positive for circulating tumor DNA    09/02/2022 - 09/02/2022 Chemotherapy   Patient is on Treatment Plan : COLORECTAL FOLFIRI + Bevacizumab q14d      Genetic Testing   Negative genetic testing. No pathogenic variants identified on the [facility] Multi-Cancer+RNA panel. VUS in APC called [id number] identified. The report date is 09/15/2022.  The Multi-Cancer + RNA Panel offered by [facility] includes sequencing and/or deletion/duplication analysis of the following 70 genes:  AIP*, ALK, APC*, ATM*, AXIN2*, BAP1*, BARD1*, BLM*, BMPR1A*, BRCA1*, BRCA2*, BRIP1*, CDC73*, CDH1*, CDK4, CDKN1B*, CDKN2A, CHEK2*, CTNNA1*, DICER1*, EPCAM, EGFR, FH*, FLCN*, GREM1, HOXB13, KIT, LZTR1,  MAX*, MBD4, MEN1*, MET, MITF, MLH1*, MSH2*, MSH3*, MSH6*, MUTYH*, NF1*, NF2*, NTHL1*, PALB2*, PDGFRA, PMS2*, POLD1*, POLE*, POT1*, PRKAR1A*, PTCH1*, PTEN*, RAD51C*, RAD51D*, RB1*, RET, SDHA*, SDHAF2*, SDHB*, SDHC*, SDHD*, SMAD4*, SMARCA4*, SMARCB1*, SMARCE1*, STK11*, SUFU*, TMEM127*, TP53*, TSC1*, TSC2*, VHL*. RNA analysis is performed for * genes.   10/20/2022 -  Chemotherapy   Patient is on Treatment Plan : COLORECTAL FOLFIRI q14d        INTERVAL HISTORY:  Bynum Reigel is here for a follow up of colon cancer   He was last seen by  NP Lacie on 09/02/2022 He presents to the clinic alone and Brother on speaker phone. Pt stated that he went to [facility]. Pt state that he has a hernia. Pt state that he has little numbness and tingling. Pt state after the last port flush he has some nausea.    All other systems were reviewed with the patient and are negative.  MEDICAL HISTORY:  Past Medical History:  Diagnosis Date   Alcohol abuse    Cancer ([place])    GERD (gastroesophageal reflux disease)    Seizure ([place])     SURGICAL HISTORY: Past Surgical History:  Procedure Laterality Date   BIOPSY  07/22/2021   Procedure: BIOPSY;  Surgeon: [name], MD;  Location: [facility];  Service: Gastroenterology;;   COLON SURGERY     COLONOSCOPY WITH PROPOFOL N/A 07/22/2021   Procedure: COLONOSCOPY WITH PROPOFOL;  Surgeon: [name], MD;  Location: [facility];  Service: Gastroenterology;  Laterality: N/A;   IR IMAGING GUIDED PORT INSERTION  10/17/2022   NO PAST SURGERIES     SUBMUCOSAL TATTOO INJECTION  07/22/2021  Procedure: SUBMUCOSAL TATTOO INJECTION;  Surgeon: [name], MD;  Location: [facility];  Service: Gastroenterology;;    I have reviewed the social history and family history with the patient and they are unchanged from previous note.  ALLERGIES:  is allergic to latex and morphine and related.  MEDICATIONS:  Current Outpatient Medications  Medication Sig Dispense  Refill   carvedilol (COREG) 25 MG tablet Take 1 tablet (25 mg total) by mouth 2 (two) times daily with a meal. 30 tablet 0   cyclobenzaprine (FLEXERIL) 10 MG tablet Take 10-20 mg by mouth 2 (two) times daily as needed for muscle spasms.     dexamethasone (DECADRON) 4 MG tablet Take 1 tablet (4 mg total) by mouth daily. Take for 3-5 days after chemo for nausea and fatigue 20 tablet 0   docusate sodium (COLACE) 100 MG capsule Take 100 mg by mouth daily.     escitalopram (LEXAPRO) 10 MG tablet Take 10 mg by mouth daily.     ferrous sulfate 325 (65 FE) MG tablet Take 1 tablet (325 mg total) by mouth 2 (two) times daily with a meal. 100 tablet 3   gabapentin (NEURONTIN) 400 MG capsule Take 400 mg by mouth every 6 (six) hours as needed.     HYDROcodone-acetaminophen (NORCO/VICODIN) 5-325 MG tablet Take 1 tablet by mouth every 6 (six) hours as needed for moderate pain.     ondansetron (ZOFRAN) 4 MG tablet Take 1 tablet (4 mg total) by mouth every 8 (eight) hours as needed for nausea or vomiting. 20 tablet 0   oxyCODONE (ROXICODONE) 5 MG immediate release tablet Take 1 tablet (5 mg total) by mouth every 6 (six) hours as needed for severe pain. 20 tablet 0   polyethylene glycol (MIRALAX / GLYCOLAX) 17 g packet Take 17 g by mouth daily as needed for mild constipation or moderate constipation. 14 each 0   potassium chloride SA (KLOR-CON M) 20 MEQ tablet Take 1 tablet (20 mEq total) by mouth daily for 5 days. 5 tablet 0   prochlorperazine (COMPAZINE) 10 MG tablet Take 1 tablet (10 mg total) by mouth every 6 (six) hours as needed (Nausea or vomiting). 30 tablet 1   promethazine (PHENERGAN) 25 MG tablet Take 1 tablet (25 mg total) by mouth every 6 (six) hours as needed for nausea or vomiting. 30 tablet 0   triamcinolone cream (KENALOG) 0.1 % Apply 1 Application topically daily as needed (for rash and itching).     No current facility-administered medications for this visit.    PHYSICAL EXAMINATION: ECOG  PERFORMANCE STATUS: 0 - Asymptomatic  Vitals:   10/20/22 0924  BP: 130/87  Pulse: 85  Resp: 16  Temp: 98.3 F (36.8 C)  SpO2: 99%   Wt Readings from Last 3 Encounters:  10/20/22 184 lb (83.5 kg)  10/17/22 185 lb (83.9 kg)  08/05/22 192 lb (87.1 kg)     GENERAL:alert, no distress and comfortable SKIN: skin color normal, no rashes or significant lesions EYES: normal, Conjunctiva are pink and non-injected, sclera clear  NEURO: alert & oriented x 3 with fluent speech  LABORATORY DATA:  I have reviewed the data as listed    Latest Ref Rng & Units 10/20/2022    8:48 AM 09/08/2022    1:28 PM 08/05/2022    7:05 AM  CBC  WBC 4.0 - 10.5 K/uL 11.9  5.4  7.1   Hemoglobin 13.0 - 17.0 g/dL 12.8  13.0  13.5   Hematocrit 39.0 - 52.0 %  38.0  38.5  40.0   Platelets 150 - 400 K/uL 322  321  283         Latest Ref Rng & Units 09/08/2022    1:28 PM 07/18/2022    2:31 PM 05/07/2022    6:08 AM  CMP  Glucose 70 - 99 mg/dL 99  108  103   BUN 6 - 20 mg/dL 10  12  8   $ Creatinine 0.61 - 1.24 mg/dL 0.78  0.71  0.88   Sodium 135 - 145 mmol/L 138  138  141   Potassium 3.5 - 5.1 mmol/L 3.7  3.8  3.2   Chloride 98 - 111 mmol/L 106  105  109   CO2 22 - 32 mmol/L 24  24  25   $ Calcium 8.9 - 10.3 mg/dL 9.7  9.7  8.7   Total Protein 6.5 - 8.1 g/dL 8.0  8.6  8.0   Total Bilirubin 0.3 - 1.2 mg/dL 0.4  0.4  1.1   Alkaline Phos 38 - 126 U/L 91  109  186   AST 15 - 41 U/L 18  17  41   ALT 0 - 44 U/L 13  19  94       RADIOGRAPHIC STUDIES: I have personally reviewed the radiological images as listed and agreed with the findings in the report. No results found.    No orders of the defined types were placed in this encounter.  All questions were answered. The patient knows to call the clinic with any problems, questions or concerns. No barriers to learning was detected. The total time spent in the appointment was 30 minutes.     Baldemar Friday, [place] 10/20/2022   I, Audry Riles, CMA, am  acting as scribe for Truitt Merle, MD.   I have reviewed the above documentation for accuracy and completeness, and I agree with the above.

## 2022-10-20 NOTE — Patient Instructions (Signed)
[facility]  Discharge Instructions: Thank you for choosing [facility] to provide your oncology and hematology care.   If you have a lab appointment with the [facility], please go directly to the [facility] and check in at the registration area.   Wear comfortable clothing and clothing appropriate for easy access to any Portacath or PICC line.   We strive to give you quality time with your provider. You may need to reschedule your appointment if you arrive late (15 or more minutes).  Arriving late affects you and other patients whose appointments are after yours.  Also, if you miss three or more appointments without notifying the office, you may be dismissed from the clinic at the provider's discretion.      For prescription refill requests, have your pharmacy contact our office and allow 72 hours for refills to be completed.    Today you received the following chemotherapy and/or immunotherapy agents Leucovorin, Irinotecan, Fluorouracil      To help prevent nausea and vomiting after your treatment, we encourage you to take your nausea medication as directed.  BELOW ARE SYMPTOMS THAT SHOULD BE REPORTED IMMEDIATELY: *FEVER GREATER THAN 100.4 F (38 C) OR HIGHER *CHILLS OR SWEATING *NAUSEA AND VOMITING THAT IS NOT CONTROLLED WITH YOUR NAUSEA MEDICATION *UNUSUAL SHORTNESS OF BREATH *UNUSUAL BRUISING OR BLEEDING *URINARY PROBLEMS (pain or burning when urinating, or frequent urination) *BOWEL PROBLEMS (unusual diarrhea, constipation, pain near the anus) TENDERNESS IN MOUTH AND THROAT WITH OR WITHOUT PRESENCE OF ULCERS (sore throat, sores in mouth, or a toothache) UNUSUAL RASH, SWELLING OR PAIN  UNUSUAL VAGINAL DISCHARGE OR ITCHING   Items with * indicate a potential emergency and should be followed up as soon as possible or go to the Emergency Department if any problems should occur.  Please show the CHEMOTHERAPY ALERT CARD or  IMMUNOTHERAPY ALERT CARD at check-in to the Emergency Department and triage nurse.  Should you have questions after your visit or need to cancel or reschedule your appointment, please contact [facility]  Dept: [phone]  and follow the prompts.  Office hours are 8:00 a.m. to 4:30 p.m. Monday - Friday. Please note that voicemails left after 4:00 p.m. may not be returned until the following business day.  We are closed weekends and major holidays. You have access to a nurse at all times for urgent questions. Please call the main number to the clinic Dept: [phone] and follow the prompts.   For any non-urgent questions, you may also contact your provider using MyChart. We now offer e-Visits for anyone 31 and older to request care online for non-urgent symptoms. For details visit [email or web address].   Also download the MyChart app! Go to the app store, search "MyChart", open the app, select [facility], and log in with your MyChart username and password.  Leucovorin Injection What is this medication? LEUCOVORIN (loo koe VOR in) prevents side effects from certain medications, such as methotrexate. It works by increasing folate levels. This helps protect healthy cells in your body. It may also be used to treat anemia caused by low levels of folate. It can also be used with fluorouracil, a type of chemotherapy, to treat colorectal cancer. It works by increasing the effects of fluorouracil in the body. This medicine may be used for other purposes; ask your health care provider or pharmacist if you have questions. What should I tell my care team before I take this  medication? They need to know if you have any of these conditions: Anemia from low levels of vitamin B12 in the blood An unusual or allergic reaction to leucovorin, folic acid, other medications, foods, dyes, or preservatives Pregnant or trying to get pregnant Breastfeeding How should I use this  medication? This medication is injected into a vein or a muscle. It is given by your care team in a hospital or clinic setting. Talk to your care team about the use of this medication in children. Special care may be needed. Overdosage: If you think you have taken too much of this medicine contact a poison control center or emergency room at once. NOTE: This medicine is only for you. Do not share this medicine with others. What if I miss a dose? Keep appointments for follow-up doses. It is important not to miss your dose. Call your care team if you are unable to keep an appointment. What may interact with this medication? Capecitabine Fluorouracil Phenobarbital Phenytoin Primidone Trimethoprim;sulfamethoxazole This list may not describe all possible interactions. Give your health care provider a list of all the medicines, herbs, non-prescription drugs, or dietary supplements you use. Also tell them if you smoke, drink alcohol, or use illegal drugs. Some items may interact with your medicine. What should I watch for while using this medication? Your condition will be monitored carefully while you are receiving this medication. This medication may increase the side effects of 5-fluorouracil. Tell your care team if you have diarrhea or mouth sores that do not get better or that get worse. What side effects may I notice from receiving this medication? Side effects that you should report to your care team as soon as possible: Allergic reactions--skin rash, itching, hives, swelling of the face, lips, tongue, or throat This list may not describe all possible side effects. Call your doctor for medical advice about side effects. You may report side effects to FDA at 1-800-FDA-1088. Where should I keep my medication? This medication is given in a hospital or clinic. It will not be stored at home. NOTE: This sheet is a summary. It may not cover all possible information. If you have questions about this  medicine, talk to your doctor, pharmacist, or health care provider.  2023 Elsevier/Gold Standard (2022-01-21 00:00:00) Fluorouracil Injection What is this medication? FLUOROURACIL (flure oh YOOR a sil) treats some types of cancer. It works by slowing down the growth of cancer cells. This medicine may be used for other purposes; ask your health care provider or pharmacist if you have questions. COMMON BRAND NAME(S): Adrucil What should I tell my care team before I take this medication? They need to know if you have any of these conditions: Blood disorders Dihydropyrimidine dehydrogenase (DPD) deficiency Infection, such as chickenpox, cold sores, herpes Kidney disease Liver disease Poor nutrition Recent or ongoing radiation therapy An unusual or allergic reaction to fluorouracil, other medications, foods, dyes, or preservatives If you or your partner are pregnant or trying to get pregnant Breast-feeding How should I use this medication? This medication is injected into a vein. It is administered by your care team in a hospital or clinic setting. Talk to your care team about the use of this medication in children. Special care may be needed. Overdosage: If you think you have taken too much of this medicine contact a poison control center or emergency room at once. NOTE: This medicine is only for you. Do not share this medicine with others. What if I miss a dose? Keep appointments  for follow-up doses. It is important not to miss your dose. Call your care team if you are unable to keep an appointment. What may interact with this medication? Do not take this medication with any of the following: Live virus vaccines This medication may also interact with the following: Medications that treat or prevent blood clots, such as warfarin, enoxaparin, dalteparin This list may not describe all possible interactions. Give your health care provider a list of all the medicines, herbs, non-prescription  drugs, or dietary supplements you use. Also tell them if you smoke, drink alcohol, or use illegal drugs. Some items may interact with your medicine. What should I watch for while using this medication? Your condition will be monitored carefully while you are receiving this medication. This medication may make you feel generally unwell. This is not uncommon as chemotherapy can affect healthy cells as well as cancer cells. Report any side effects. Continue your course of treatment even though you feel ill unless your care team tells you to stop. In some cases, you may be given additional medications to help with side effects. Follow all directions for their use. This medication may increase your risk of getting an infection. Call your care team for advice if you get a fever, chills, sore throat, or other symptoms of a cold or flu. Do not treat yourself. Try to avoid being around people who are sick. This medication may increase your risk to bruise or bleed. Call your care team if you notice any unusual bleeding. Be careful brushing or flossing your teeth or using a toothpick because you may get an infection or bleed more easily. If you have any dental work done, tell your dentist you are receiving this medication. Avoid taking medications that contain aspirin, acetaminophen, ibuprofen, naproxen, or ketoprofen unless instructed by your care team. These medications may hide a fever. Do not treat diarrhea with over the counter products. Contact your care team if you have diarrhea that lasts more than 2 days or if it is severe and watery. This medication can make you more sensitive to the sun. Keep out of the sun. If you cannot avoid being in the sun, wear protective clothing and sunscreen. Do not use sun lamps, tanning beds, or tanning booths. Talk to your care team if you or your partner wish to become pregnant or think you might be pregnant. This medication can cause serious birth defects if taken during  pregnancy and for 3 months after the last dose. A reliable form of contraception is recommended while taking this medication and for 3 months after the last dose. Talk to your care team about effective forms of contraception. Do not father a child while taking this medication and for 3 months after the last dose. Use a condom while having sex during this time period. Do not breastfeed while taking this medication. This medication may cause infertility. Talk to your care team if you are concerned about your fertility. What side effects may I notice from receiving this medication? Side effects that you should report to your care team as soon as possible: Allergic reactions--skin rash, itching, hives, swelling of the face, lips, tongue, or throat Heart attack--pain or tightness in the chest, shoulders, arms, or jaw, nausea, shortness of breath, cold or clammy skin, feeling faint or lightheaded Heart failure--shortness of breath, swelling of the ankles, feet, or hands, sudden weight gain, unusual weakness or fatigue Heart rhythm changes--fast or irregular heartbeat, dizziness, feeling faint or lightheaded, chest pain, trouble breathing  High ammonia level--unusual weakness or fatigue, confusion, loss of appetite, nausea, vomiting, seizures Infection--fever, chills, cough, sore throat, wounds that don't heal, pain or trouble when passing urine, general feeling of discomfort or being unwell Low red blood cell level--unusual weakness or fatigue, dizziness, headache, trouble breathing Pain, tingling, or numbness in the hands or feet, muscle weakness, change in vision, confusion or trouble speaking, loss of balance or coordination, trouble walking, seizures Redness, swelling, and blistering of the skin over hands and feet Severe or prolonged diarrhea Unusual bruising or bleeding Side effects that usually do not require medical attention (report to your care team if they continue or are bothersome): Dry  skin Headache Increased tears Nausea Pain, redness, or swelling with sores inside the mouth or throat Sensitivity to light Vomiting This list may not describe all possible side effects. Call your doctor for medical advice about side effects. You may report side effects to FDA at 1-800-FDA-1088. Where should I keep my medication? This medication is given in a hospital or clinic. It will not be stored at home. NOTE: This sheet is a summary. It may not cover all possible information. If you have questions about this medicine, talk to your doctor, pharmacist, or health care provider.  2023 Elsevier/Gold Standard (2021-12-17 00:00:00)  Irinotecan Injection What is this medication? IRINOTECAN (ir in oh TEE kan) treats some types of cancer. It works by slowing down the growth of cancer cells. This medicine may be used for other purposes; ask your health care provider or pharmacist if you have questions. COMMON BRAND NAME(S): Camptosar What should I tell my care team before I take this medication? They need to know if you have any of these conditions: Dehydration Diarrhea Infection, especially a viral infection, such as chickenpox, cold sores, herpes Liver disease Low blood cell levels (white cells, red cells, and platelets) Low levels of electrolytes, such as calcium, magnesium, or potassium in your blood Recent or ongoing radiation An unusual or allergic reaction to irinotecan, other medications, foods, dyes, or preservatives If you or your partner are pregnant or trying to get pregnant Breast-feeding How should I use this medication? This medication is injected into a vein. It is given by your care team in a hospital or clinic setting. Talk to your care team about the use of this medication in children. Special care may be needed. Overdosage: If you think you have taken too much of this medicine contact a poison control center or emergency room at once. NOTE: This medicine is only for  you. Do not share this medicine with others. What if I miss a dose? Keep appointments for follow-up doses. It is important not to miss your dose. Call your care team if you are unable to keep an appointment. What may interact with this medication? Do not take this medication with any of the following: Cobicistat Itraconazole This medication may also interact with the following: Certain antibiotics, such as clarithromycin, rifampin, rifabutin Certain antivirals for HIV or AIDS Certain medications for fungal infections, such as ketoconazole, posaconazole, voriconazole Certain medications for seizures, such as carbamazepine, phenobarbital, phenytoin Gemfibrozil Nefazodone St. John's wort This list may not describe all possible interactions. Give your health care provider a list of all the medicines, herbs, non-prescription drugs, or dietary supplements you use. Also tell them if you smoke, drink alcohol, or use illegal drugs. Some items may interact with your medicine. What should I watch for while using this medication? Your condition will be monitored carefully while you are receiving  this medication. You may need blood work while taking this medication. This medication may make you feel generally unwell. This is not uncommon as chemotherapy can affect healthy cells as well as cancer cells. Report any side effects. Continue your course of treatment even though you feel ill unless your care team tells you to stop. This medication can cause serious side effects. To reduce the risk, your care team may give you other medications to take before receiving this one. Be sure to follow the directions from your care team. This medication may affect your coordination, reaction time, or judgement. Do not drive or operate machinery until you know how this medication affects you. Sit up or stand slowly to reduce the risk of dizzy or fainting spells. Drinking alcohol with this medication can increase the risk of  these side effects. This medication may increase your risk of getting an infection. Call your care team for advice if you get a fever, chills, sore throat, or other symptoms of a cold or flu. Do not treat yourself. Try to avoid being around people who are sick. Avoid taking medications that contain aspirin, acetaminophen, ibuprofen, naproxen, or ketoprofen unless instructed by your care team. These medications may hide a fever. This medication may increase your risk to bruise or bleed. Call your care team if you notice any unusual bleeding. Be careful brushing or flossing your teeth or using a toothpick because you may get an infection or bleed more easily. If you have any dental work done, tell your dentist you are receiving this medication. Talk to your care team if you or your partner are pregnant or think either of you might be pregnant. This medication can cause serious birth defects if taken during pregnancy and for 6 months after the last dose. You will need a negative pregnancy test before starting this medication. Contraception is recommended while taking this medication and for 6 months after the last dose. Your care team can help you find the option that works for you. Do not father a child while taking this medication and for 3 months after the last dose. Use a condom for contraception during this time period. Do not breastfeed while taking this medication and for 7 days after the last dose. This medication may cause infertility. Talk to your care team if you are concerned about your fertility. What side effects may I notice from receiving this medication? Side effects that you should report to your care team as soon as possible: Allergic reactions--skin rash, itching, hives, swelling of the face, lips, tongue, or throat Dry cough, shortness of breath or trouble breathing Increased saliva or tears, increased sweating, stomach cramping, diarrhea, small pupils, unusual weakness or fatigue,  slow heartbeat Infection--fever, chills, cough, sore throat, wounds that don't heal, pain or trouble when passing urine, general feeling of discomfort or being unwell Kidney injury--decrease in the amount of urine, swelling of the ankles, hands, or feet Low red blood cell level--unusual weakness or fatigue, dizziness, headache, trouble breathing Severe or prolonged diarrhea Unusual bruising or bleeding Side effects that usually do not require medical attention (report to your care team if they continue or are bothersome): Constipation Diarrhea Hair loss Loss of appetite Nausea Stomach pain This list may not describe all possible side effects. Call your doctor for medical advice about side effects. You may report side effects to FDA at 1-800-FDA-1088. Where should I keep my medication? This medication is given in a hospital or clinic. It will not be stored at  home. NOTE: This sheet is a summary. It may not cover all possible information. If you have questions about this medicine, talk to your doctor, pharmacist, or health care provider.  2023 Elsevier/Gold Standard (2021-12-26 00:00:00)

## 2022-10-21 DIAGNOSIS — G4700 Insomnia, unspecified: Secondary | ICD-10-CM | POA: Diagnosis not present

## 2022-10-21 DIAGNOSIS — I10 Essential (primary) hypertension: Secondary | ICD-10-CM | POA: Diagnosis not present

## 2022-10-21 DIAGNOSIS — C189 Malignant neoplasm of colon, unspecified: Secondary | ICD-10-CM | POA: Diagnosis not present

## 2022-10-22 ENCOUNTER — Other Ambulatory Visit: Payer: Self-pay

## 2022-10-22 ENCOUNTER — Inpatient Hospital Stay: Payer: Medicaid Other

## 2022-10-22 ENCOUNTER — Telehealth: Payer: Self-pay

## 2022-10-22 VITALS — BP 125/84 | HR 69 | Temp 98.0°F

## 2022-10-22 DIAGNOSIS — Z79899 Other long term (current) drug therapy: Secondary | ICD-10-CM | POA: Diagnosis not present

## 2022-10-22 DIAGNOSIS — C182 Malignant neoplasm of ascending colon: Secondary | ICD-10-CM

## 2022-10-22 DIAGNOSIS — Z5111 Encounter for antineoplastic chemotherapy: Secondary | ICD-10-CM | POA: Diagnosis not present

## 2022-10-22 DIAGNOSIS — C787 Secondary malignant neoplasm of liver and intrahepatic bile duct: Secondary | ICD-10-CM | POA: Diagnosis not present

## 2022-10-22 MED ORDER — SODIUM CHLORIDE 0.9% FLUSH
10.0000 mL | INTRAVENOUS | Status: DC | PRN
  Administered 2022-10-22: 10 mL

## 2022-10-22 MED ORDER — HEPARIN SOD (PORK) LOCK FLUSH 100 UNIT/ML IV SOLN
500.0000 [IU] | Freq: Once | INTRAVENOUS | Status: AC | PRN
  Administered 2022-10-22: 500 [IU]

## 2022-10-23 ENCOUNTER — Encounter: Payer: Self-pay | Admitting: Hematology

## 2022-10-23 ENCOUNTER — Encounter: Payer: Self-pay | Admitting: Nurse Practitioner

## 2022-10-23 NOTE — Telephone Encounter (Signed)
[facility] was ordered for this patient.  Phelbotomy will be done on March 4 at 945 in port flush.  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

## 2022-10-27 ENCOUNTER — Other Ambulatory Visit: Payer: Self-pay

## 2022-10-31 MED FILL — Dexamethasone Sodium Phosphate Inj 100 MG/10ML: INTRAMUSCULAR | Qty: 1 | Status: AC

## 2022-11-02 NOTE — Progress Notes (Unsigned)
Patient Care Team: Erby Pian, [name] as PCP - General (Physician Assistant) Ralene Ok, MD as Consulting Physician (General Surgery) Alla Feeling, NP as Nurse Practitioner (Nurse Practitioner) Truitt Merle, MD as Consulting Physician (Hematology) Mohammed Kindle, MD as Referring Physician (Pain Medicine)   CHIEF COMPLAINT: Follow up recurrent/metastatic colon cancer   Oncology History Overview Note   Cancer Staging  Cancer of right colon [facility]) Staging form: Colon and Rectum, AJCC 8th Edition - Pathologic stage from 07/23/2021: Stage IIIC (pT4a, pN2b, cM0) - Signed by Alla Feeling, NP on 08/15/2021 Stage prefix: Initial diagnosis Histologic grading system: 4 grade system Histologic grade (G): G2 Laterality: Right Lymph-vascular invasion (LVI): LVI present/identified, NOS Perineural invasion (PNI): Present Microsatellite instability (MSI): Stable     Cancer of right colon ([place])  07/19/2021 Imaging   CT AP IMPRESSION: Focal area of high-grade narrowing of the proximal ascending colon may be sequela of chronic inflammation and stricture versus a mass. There is mild distension of the cecum and terminal ileum. Clinical correlation and further evaluation with lower GI study or colonoscopy recommended.   07/22/2021 Procedure   Colonoscopy A severe stenosis was found in the distal ascending colon and was non-traversed. It was ulcerated and inflamed. This could be inflammatory versus malignant stricture.   07/22/2021 Initial Biopsy   FINAL MICROSCOPIC DIAGNOSIS:  A. COLON, ASCENDING, BIOPSY:  - Invasive colorectal adenocarcinoma, moderately differentiated.    07/23/2021 Definitive Surgery   PRE-OPERATIVE DIAGNOSIS:  right colon mass POST-OPERATIVE DIAGNOSIS:  right colon mass PROCEDURE:  Procedure(s): XI ROBOT ASSISTED LAPAROSCOPIC RIGHT COLECTOMY (N/A) Dr. Rosendo Gros   07/23/2021 Pathology Results   FINAL MICROSCOPIC DIAGNOSIS:  A. COLON, RIGHT,  RESECTION:  - Invasive moderately differentiated colonic adenocarcinoma, 4.5 cm.  - Carcinoma extends into pericolonic connective tissue and focally  involves serosal surface.  - Margins not involved.  - Metastatic carcinoma in nine of nineteen lymph nodes (9/19).  - Unremarkable appendix. MMR normal, MSI-stable    07/23/2021 Cancer Staging   Staging form: Colon and Rectum, AJCC 8th Edition - Pathologic stage from 07/23/2021: Stage IIIC (pT4a, pN2b, cM0) - Signed by Alla Feeling, NP on 08/15/2021 Stage prefix: Initial diagnosis Histologic grading system: 4 grade system Histologic grade (G): G2 Laterality: Right Lymph-vascular invasion (LVI): LVI present/identified, NOS Perineural invasion (PNI): Present Microsatellite instability (MSI): Stable   07/24/2021 Initial Diagnosis   Colon adenocarcinoma ([place])   07/25/2021 Tumor Marker   CEA: 2.9   07/28/2021 Imaging   CT chest IMPRESSION: 1. No evidence of metastatic disease within the chest. 2. Lungs demonstrate atelectasis, most evident lower lobes and bases of the left upper lobe lingula and right middle lobe. No convincing pneumonia and no evidence of pulmonary edema.     09/11/2021 - 01/15/2022 Chemotherapy   Patient is on Treatment Plan : COLORECTAL Xelox ([place]ox) q21d     08/2022 Progression   PET scan showed hypermetabolic oligo liver metastasis, biopsy attempted but liver lesion was not seen by [place]. GuardantReveal was also positive for circulating tumor DNA    09/02/2022 - 09/02/2022 Chemotherapy   Patient is on Treatment Plan : COLORECTAL FOLFIRI + Bevacizumab q14d      Genetic Testing   Negative genetic testing. No pathogenic variants identified on the [facility] Multi-Cancer+RNA panel. VUS in APC called [id number] identified. The report date is 09/15/2022.  The Multi-Cancer + RNA Panel offered by [facility] includes sequencing and/or deletion/duplication analysis of the following 70 genes:  AIP*, ALK, APC*, ATM*, AXIN2*,  BAP1*,  BARD1*, BLM*, BMPR1A*, BRCA1*, BRCA2*, BRIP1*, CDC73*, CDH1*, CDK4, CDKN1B*, CDKN2A, CHEK2*, CTNNA1*, DICER1*, EPCAM, EGFR, FH*, FLCN*, GREM1, HOXB13, KIT, LZTR1, MAX*, MBD4, MEN1*, MET, MITF, MLH1*, MSH2*, MSH3*, MSH6*, MUTYH*, NF1*, NF2*, NTHL1*, PALB2*, PDGFRA, PMS2*, POLD1*, POLE*, POT1*, PRKAR1A*, PTCH1*, PTEN*, RAD51C*, RAD51D*, RB1*, RET, SDHA*, SDHAF2*, SDHB*, SDHC*, SDHD*, SMAD4*, SMARCA4*, SMARCB1*, SMARCE1*, STK11*, SUFU*, TMEM127*, TP53*, TSC1*, TSC2*, VHL*. RNA analysis is performed for * genes.   10/20/2022 -  Chemotherapy   Patient is on Treatment Plan : COLORECTAL FOLFIRI q14d        CURRENT THERAPY: Neoadjuvant FOLFIRI q14 days, starting 10/20/22  INTERVAL HISTORY Mr. Antigua returns for follow up and treatment as scheduled. Last seen by Dr. Burr Medico and began cycle 1 on 2/19. He had some chills during chemo but no allergic reaction, and sometimes at night without fever. Chemo went OK, better than FOLFOX in the past. He was glad not to have irritation from chemo in peripheral IV. He felt tired with moderate nausea for 2-3 days. He gagged but no vomiting. He didn't eat much. Zofran more effective than Compazine. He recovered well and was able to eat. Bowels moving ok, no diarrhea. Denies pain.    ROS  All other systems reviewed and negative  Past Medical History:  Diagnosis Date   Alcohol abuse    Cancer ([employer])    GERD (gastroesophageal reflux disease)    Seizure ([place])      Past Surgical History:  Procedure Laterality Date   BIOPSY  07/22/2021   Procedure: BIOPSY;  Surgeon: [name], MD;  Location: [facility];  Service: Gastroenterology;;   COLON SURGERY     COLONOSCOPY WITH PROPOFOL N/A 07/22/2021   Procedure: COLONOSCOPY WITH PROPOFOL;  Surgeon: [name], MD;  Location: [facility];  Service: Gastroenterology;  Laterality: N/A;   IR IMAGING GUIDED PORT INSERTION  10/17/2022   NO PAST SURGERIES     SUBMUCOSAL TATTOO INJECTION  07/22/2021    Procedure: SUBMUCOSAL TATTOO INJECTION;  Surgeon: [name], MD;  Location: [facility];  Service: Gastroenterology;;     Outpatient Encounter Medications as of 11/03/2022  Medication Sig Note   carvedilol (COREG) 25 MG tablet Take 1 tablet (25 mg total) by mouth 2 (two) times daily with a meal.    cyclobenzaprine (FLEXERIL) 10 MG tablet Take 10-20 mg by mouth 2 (two) times daily as needed for muscle spasms.    dexamethasone (DECADRON) 4 MG tablet Take 1 tablet (4 mg total) by mouth daily. Take for 3-5 days after chemo for nausea and fatigue    docusate sodium (COLACE) 100 MG capsule Take 100 mg by mouth daily.    escitalopram (LEXAPRO) 10 MG tablet Take 10 mg by mouth daily.    ferrous sulfate 325 (65 FE) MG tablet Take 1 tablet (325 mg total) by mouth 2 (two) times daily with a meal.    gabapentin (NEURONTIN) 400 MG capsule Take 400 mg by mouth every 6 (six) hours as needed.    HYDROcodone-acetaminophen (NORCO/VICODIN) 5-325 MG tablet Take 1 tablet by mouth every 6 (six) hours as needed for moderate pain.    lidocaine-prilocaine (EMLA) cream Apply to affected area once    ondansetron (ZOFRAN) 8 MG tablet Take 1 tablet (8 mg total) by mouth every 8 (eight) hours as needed for nausea, vomiting or refractory nausea / vomiting. Start on the third day after chemotherapy.    oxyCODONE (ROXICODONE) 5 MG immediate release tablet Take 1 tablet (5 mg total) by mouth every 6 (six) hours  as needed for severe pain.    polyethylene glycol (MIRALAX / GLYCOLAX) 17 g packet Take 17 g by mouth daily as needed for mild constipation or moderate constipation.    prochlorperazine (COMPAZINE) 10 MG tablet Take 1 tablet (10 mg total) by mouth every 6 (six) hours as needed for nausea or vomiting.    triamcinolone cream (KENALOG) 0.1 % Apply 1 Application topically daily as needed (for rash and itching).    [DISCONTINUED] ondansetron (ZOFRAN) 4 MG tablet Take 1 tablet (4 mg total) by mouth every 8 (eight) hours as  needed for nausea or vomiting.    [DISCONTINUED] prochlorperazine (COMPAZINE) 10 MG tablet Take 1 tablet (10 mg total) by mouth every 6 (six) hours as needed (Nausea or vomiting). 10/17/2022: Not started   [DISCONTINUED] promethazine (PHENERGAN) 25 MG tablet Take 1 tablet (25 mg total) by mouth every 6 (six) hours as needed for nausea or vomiting.    potassium chloride SA (KLOR-CON M) 20 MEQ tablet Take 1 tablet (20 mEq total) by mouth daily for 5 days.    promethazine (PHENERGAN) 25 MG tablet Take 1 tablet (25 mg total) by mouth every 6 (six) hours as needed for nausea or vomiting.    Facility-Administered Encounter Medications as of 11/03/2022  Medication   [COMPLETED] sodium chloride flush (NS) 0.9 % injection 10 mL     Today's Vitals   11/03/22 1034  BP: 125/89  Pulse: 76  Resp: 15  Temp: 98.4 F (36.9 C)  SpO2: 96%  Weight: 188 lb 11.2 oz (85.6 kg)   Body mass index is 27.87 kg/m.   PHYSICAL EXAM GENERAL:alert, no distress and comfortable SKIN: no rash  EYES: sclera clear NECK: without mass LYMPH:  no palpable cervical or supraclavicular lymphadenopathy  LUNGS: clear with normal breathing effort HEART: regular rate & rhythm, no lower extremity edema ABDOMEN: abdomen soft, non-tender and normal bowel sounds NEURO: alert & oriented x 3 with fluent speech  PAC without erythema    CBC    Component Value Date/Time   WBC 5.2 11/03/2022 0959   WBC 7.1 08/05/2022 0705   RBC 3.92 (L) 11/03/2022 0959   HGB 11.3 (L) 11/03/2022 0959   HCT 33.8 (L) 11/03/2022 0959   PLT 261 11/03/2022 0959   MCV 86.2 11/03/2022 0959   MCH 28.8 11/03/2022 0959   MCHC 33.4 11/03/2022 0959   RDW 12.8 11/03/2022 0959   LYMPHSABS 1.8 11/03/2022 0959   MONOABS 0.4 11/03/2022 0959   EOSABS 0.2 11/03/2022 0959   BASOSABS 0.0 11/03/2022 0959     CMP     Component Value Date/Time   NA 141 11/03/2022 0959   K 3.9 11/03/2022 0959   CL 106 11/03/2022 0959   CO2 29 11/03/2022 0959   GLUCOSE 102  (H) 11/03/2022 0959   BUN 10 11/03/2022 0959   CREATININE 0.89 11/03/2022 0959   CREATININE 0.62 10/21/2012 1530   CALCIUM 9.0 11/03/2022 0959   PROT 7.3 11/03/2022 0959   ALBUMIN 4.0 11/03/2022 0959   AST 20 11/03/2022 0959   ALT 24 11/03/2022 0959   ALKPHOS 107 11/03/2022 0959   BILITOT 0.3 11/03/2022 0959   GFRNONAA >60 11/03/2022 0959   GFRNONAA >89 10/21/2012 1530   GFRAA >60 11/28/2015 0501   GFRAA >89 10/21/2012 1530     ASSESSMENT & PLAN:Jovane Painton is a 47 y.o. 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 Rosendo Gros 07/23/21, final path showed: pT4a lesion with LVI and PNI, no perforation; clear margins; 9/19 positive LNs, pN2b -He received Capox 09/11/21 - 01/15/22, oxali discontinued due to significant nausea/vomiting and headaches. He completed Xeloda in late June 2023.  -on surveillance  -CT 07/2022 showed new hepatic subsegment VIII lesion just along the margin of the middle hepatic vein near the dome of the RIGHT hemiliver, and enlarging RIGHT upper lobe pulmonary nodule suspicious for metastatic disease.  The liver lesion was hypermetabolic on PET scan, but lung nodule was not  -liver biopsy was attempted but not feasible  - GuardantReveal was positive for circulating tumor DNA, consistent with recurrence -He requested second opinion, seen at [facility].  Neoadjuvant chemo recommended before surgical resection, he agreed.  Port placed -plan to repeat imaging after 3 months of chemo -Mr. Thornberry appears stable. He began FOLFIRI 10/20/2022, tolerated first cycle well with mid fatigue and moderate nausea for 2-3 days, he will try phenergan instead of compazine and continue zofran PRN. Will add Emend to pre-meds (prior auth not required). Otherwise able to manage at home and recover well. No clinical evidence of disease progression -I  reviewed NGS results and discussed with Dr. Burr Medico and the pt: KRAS/NRAS/BRAF wild type, PIK3CA +. We discussed even though he is a candidate for EGFR inhibitor, he would not benefit given that his primary tumor was right side colon cancer. Continue FOLFIRI. -Labs reviewed, adequate for treatment -F/up and cycle 3 in 2 weeks     PLAN: -Labs and NGS reviewed -Proceed with cycle 2 FOLFIRI, same dose -For nausea: add Emend pre-med, switch compazine to phenergan and continue zofran PRN -F/up and cycle 3 in 2 weeks    All questions were answered. The patient knows to call the clinic with any problems, questions or concerns. No barriers to learning were detected. I spent 20 minutes counseling the patient face to face. The total time spent in the appointment was 30 minutes and more than 50% was on counseling, review of test results, and coordination of care.   Cira Rue, NP-C 11/03/2022

## 2022-11-03 ENCOUNTER — Other Ambulatory Visit: Payer: Self-pay

## 2022-11-03 ENCOUNTER — Inpatient Hospital Stay: Payer: Medicaid Other | Attending: Nurse Practitioner | Admitting: Nurse Practitioner

## 2022-11-03 ENCOUNTER — Inpatient Hospital Stay: Payer: Medicaid Other

## 2022-11-03 ENCOUNTER — Inpatient Hospital Stay (HOSPITAL_BASED_OUTPATIENT_CLINIC_OR_DEPARTMENT_OTHER): Payer: Medicaid Other

## 2022-11-03 ENCOUNTER — Encounter: Payer: Self-pay | Admitting: Nurse Practitioner

## 2022-11-03 DIAGNOSIS — C182 Malignant neoplasm of ascending colon: Secondary | ICD-10-CM | POA: Insufficient documentation

## 2022-11-03 DIAGNOSIS — F1010 Alcohol abuse, uncomplicated: Secondary | ICD-10-CM | POA: Insufficient documentation

## 2022-11-03 DIAGNOSIS — K219 Gastro-esophageal reflux disease without esophagitis: Secondary | ICD-10-CM | POA: Insufficient documentation

## 2022-11-03 DIAGNOSIS — D500 Iron deficiency anemia secondary to blood loss (chronic): Secondary | ICD-10-CM

## 2022-11-03 DIAGNOSIS — R112 Nausea with vomiting, unspecified: Secondary | ICD-10-CM | POA: Insufficient documentation

## 2022-11-03 DIAGNOSIS — Z5111 Encounter for antineoplastic chemotherapy: Secondary | ICD-10-CM | POA: Diagnosis not present

## 2022-11-03 DIAGNOSIS — C787 Secondary malignant neoplasm of liver and intrahepatic bile duct: Secondary | ICD-10-CM | POA: Insufficient documentation

## 2022-11-03 DIAGNOSIS — Z79899 Other long term (current) drug therapy: Secondary | ICD-10-CM | POA: Insufficient documentation

## 2022-11-03 DIAGNOSIS — K6389 Other specified diseases of intestine: Secondary | ICD-10-CM | POA: Insufficient documentation

## 2022-11-03 DIAGNOSIS — R634 Abnormal weight loss: Secondary | ICD-10-CM | POA: Insufficient documentation

## 2022-11-03 DIAGNOSIS — R197 Diarrhea, unspecified: Secondary | ICD-10-CM | POA: Diagnosis not present

## 2022-11-03 LAB — CMP (CANCER CENTER ONLY)
ALT: 24 U/L (ref 0–44)
AST: 20 U/L (ref 15–41)
Albumin: 4 g/dL (ref 3.5–5.0)
Alkaline Phosphatase: 107 U/L (ref 38–126)
Anion gap: 6 (ref 5–15)
BUN: 10 mg/dL (ref 6–20)
CO2: 29 mmol/L (ref 22–32)
Calcium: 9 mg/dL (ref 8.9–10.3)
Chloride: 106 mmol/L (ref 98–111)
Creatinine: 0.89 mg/dL (ref 0.61–1.24)
GFR, Estimated: >60 mL/min (ref >60)
Glucose, Bld: 102 mg/dL — ABNORMAL HIGH (ref 70–99)
Potassium: 3.9 mmol/L (ref 3.5–5.1)
Sodium: 141 mmol/L (ref 135–145)
Total Bilirubin: 0.3 mg/dL (ref 0.3–1.2)
Total Protein: 7.3 g/dL (ref 6.5–8.1)

## 2022-11-03 LAB — CBC WITH DIFFERENTIAL (CANCER CENTER ONLY)
Abs Immature Granulocytes: 0.03 10*3/uL (ref 0.00–0.07)
Basophils Absolute: 0 10*3/uL (ref 0.0–0.1)
Basophils Relative: 1 %
Eosinophils Absolute: 0.2 10*3/uL (ref 0.0–0.5)
Eosinophils Relative: 3 %
HCT: 33.8 % — ABNORMAL LOW (ref 39.0–52.0)
Hemoglobin: 11.3 g/dL — ABNORMAL LOW (ref 13.0–17.0)
Immature Granulocytes: 1 %
Lymphocytes Relative: 35 %
Lymphs Abs: 1.8 10*3/uL (ref 0.7–4.0)
MCH: 28.8 pg (ref 26.0–34.0)
MCHC: 33.4 g/dL (ref 30.0–36.0)
MCV: 86.2 fL (ref 80.0–100.0)
Monocytes Absolute: 0.4 10*3/uL (ref 0.1–1.0)
Monocytes Relative: 7 %
Neutro Abs: 2.8 10*3/uL (ref 1.7–7.7)
Neutrophils Relative %: 53 %
Platelet Count: 261 10*3/uL (ref 150–400)
RBC: 3.92 MIL/uL — ABNORMAL LOW (ref 4.22–5.81)
RDW: 12.8 % (ref 11.5–15.5)
WBC Count: 5.2 10*3/uL (ref 4.0–10.5)
nRBC: 0 % (ref 0.0–0.2)

## 2022-11-03 LAB — MISCELLANEOUS TEST

## 2022-11-03 MED ORDER — PROMETHAZINE HCL 25 MG PO TABS: 25.0000 mg | ORAL_TABLET | Freq: Four times a day (QID) | ORAL | 1 refills | Status: DC | PRN

## 2022-11-03 MED ORDER — PALONOSETRON HCL INJECTION 0.25 MG/5ML
0.2500 mg | Freq: Once | INTRAVENOUS | Status: AC
  Administered 2022-11-03: 0.25 mg via INTRAVENOUS
  Filled 2022-11-03: qty 5

## 2022-11-03 MED ORDER — SODIUM CHLORIDE 0.9% FLUSH
10.0000 mL | Freq: Once | INTRAVENOUS | Status: AC | PRN
  Administered 2022-11-03: 10 mL

## 2022-11-03 MED ORDER — SODIUM CHLORIDE 0.9 % IV SOLN
10.0000 mg | Freq: Once | INTRAVENOUS | Status: AC
  Administered 2022-11-03: 10 mg via INTRAVENOUS
  Filled 2022-11-03: qty 10

## 2022-11-03 MED ORDER — FLUOROURACIL CHEMO INJECTION 2.5 GM/50ML
400.0000 mg/m2 | Freq: Once | INTRAVENOUS | Status: AC
  Administered 2022-11-03: 800 mg via INTRAVENOUS
  Filled 2022-11-03: qty 16

## 2022-11-03 MED ORDER — HEPARIN SOD (PORK) LOCK FLUSH 100 UNIT/ML IV SOLN: 500.0000 [IU] | Freq: Once | INTRAVENOUS | Status: DC | PRN

## 2022-11-03 MED ORDER — SODIUM CHLORIDE 0.9 % IV SOLN
400.0000 mg/m2 | Freq: Once | INTRAVENOUS | Status: AC
  Administered 2022-11-03: 804 mg via INTRAVENOUS
  Filled 2022-11-03: qty 40.2

## 2022-11-03 MED ORDER — SODIUM CHLORIDE 0.9 % IV SOLN
2400.0000 mg/m2 | INTRAVENOUS | Status: DC
  Administered 2022-11-03: 5000 mg via INTRAVENOUS
  Filled 2022-11-03: qty 100

## 2022-11-03 MED ORDER — SODIUM CHLORIDE 0.9 % IV SOLN
400.0000 mg/m2 | Freq: Once | INTRAVENOUS | Status: DC
  Filled 2022-11-03: qty 40.2

## 2022-11-03 MED ORDER — SODIUM CHLORIDE 0.9 % IV SOLN: Freq: Once | INTRAVENOUS | Status: AC

## 2022-11-03 MED ORDER — SODIUM CHLORIDE 0.9% FLUSH: 10.0000 mL | INTRAVENOUS | Status: DC | PRN

## 2022-11-03 MED ORDER — SODIUM CHLORIDE 0.9 % IV SOLN
150.0000 mg | Freq: Once | INTRAVENOUS | Status: AC
  Administered 2022-11-03: 150 mg via INTRAVENOUS
  Filled 2022-11-03: qty 150

## 2022-11-03 MED ORDER — SODIUM CHLORIDE 0.9 % IV SOLN
180.0000 mg/m2 | Freq: Once | INTRAVENOUS | Status: AC
  Administered 2022-11-03: 400 mg via INTRAVENOUS
  Filled 2022-11-03: qty 20

## 2022-11-03 MED ORDER — ATROPINE SULFATE 1 MG/ML IV SOLN
0.5000 mg | Freq: Once | INTRAVENOUS | Status: AC | PRN
  Administered 2022-11-03: 0.5 mg via INTRAVENOUS
  Filled 2022-11-03: qty 1

## 2022-11-03 NOTE — Patient Instructions (Signed)
[facility]  Discharge Instructions: Thank you for choosing [facility] to provide your oncology and hematology care.   If you have a lab appointment with the [facility], please go directly to the [facility] and check in at the registration area.   Wear comfortable clothing and clothing appropriate for easy access to any Portacath or PICC line.   We strive to give you quality time with your provider. You may need to reschedule your appointment if you arrive late (15 or more minutes).  Arriving late affects you and other patients whose appointments are after yours.  Also, if you miss three or more appointments without notifying the office, you may be dismissed from the clinic at the provider's discretion.      For prescription refill requests, have your pharmacy contact our office and allow 72 hours for refills to be completed.    Today you received the following chemotherapy and/or immunotherapy agents: Irinotecan and Fluorouracil       To help prevent nausea and vomiting after your treatment, we encourage you to take your nausea medication as directed.  BELOW ARE SYMPTOMS THAT SHOULD BE REPORTED IMMEDIATELY: *FEVER GREATER THAN 100.4 F (38 C) OR HIGHER *CHILLS OR SWEATING *NAUSEA AND VOMITING THAT IS NOT CONTROLLED WITH YOUR NAUSEA MEDICATION *UNUSUAL SHORTNESS OF BREATH *UNUSUAL BRUISING OR BLEEDING *URINARY PROBLEMS (pain or burning when urinating, or frequent urination) *BOWEL PROBLEMS (unusual diarrhea, constipation, pain near the anus) TENDERNESS IN MOUTH AND THROAT WITH OR WITHOUT PRESENCE OF ULCERS (sore throat, sores in mouth, or a toothache) UNUSUAL RASH, SWELLING OR PAIN  UNUSUAL VAGINAL DISCHARGE OR ITCHING   Items with * indicate a potential emergency and should be followed up as soon as possible or go to the Emergency Department if any problems should occur.  Please show the CHEMOTHERAPY ALERT CARD or IMMUNOTHERAPY  ALERT CARD at check-in to the Emergency Department and triage nurse.  Should you have questions after your visit or need to cancel or reschedule your appointment, please contact [facility]  Dept: [phone]  and follow the prompts.  Office hours are 8:00 a.m. to 4:30 p.m. Monday - Friday. Please note that voicemails left after 4:00 p.m. may not be returned until the following business day.  We are closed weekends and major holidays. You have access to a nurse at all times for urgent questions. Please call the main number to the clinic Dept: [phone] and follow the prompts.   For any non-urgent questions, you may also contact your provider using MyChart. We now offer e-Visits for anyone 1 and older to request care online for non-urgent symptoms. For details visit [email or web address].   Also download the MyChart app! Go to the app store, search "MyChart", open the app, select [facility], and log in with your MyChart username and password.

## 2022-11-04 ENCOUNTER — Encounter: Payer: Self-pay | Admitting: Hematology

## 2022-11-05 ENCOUNTER — Other Ambulatory Visit: Payer: Self-pay

## 2022-11-05 ENCOUNTER — Inpatient Hospital Stay: Payer: Medicaid Other

## 2022-11-05 VITALS — BP 128/87 | HR 75 | Temp 98.0°F | Resp 16

## 2022-11-05 DIAGNOSIS — K219 Gastro-esophageal reflux disease without esophagitis: Secondary | ICD-10-CM | POA: Diagnosis not present

## 2022-11-05 DIAGNOSIS — K6389 Other specified diseases of intestine: Secondary | ICD-10-CM | POA: Diagnosis not present

## 2022-11-05 DIAGNOSIS — Z79899 Other long term (current) drug therapy: Secondary | ICD-10-CM | POA: Diagnosis not present

## 2022-11-05 DIAGNOSIS — C787 Secondary malignant neoplasm of liver and intrahepatic bile duct: Secondary | ICD-10-CM | POA: Diagnosis not present

## 2022-11-05 DIAGNOSIS — R112 Nausea with vomiting, unspecified: Secondary | ICD-10-CM | POA: Diagnosis not present

## 2022-11-05 DIAGNOSIS — R634 Abnormal weight loss: Secondary | ICD-10-CM | POA: Diagnosis not present

## 2022-11-05 DIAGNOSIS — R197 Diarrhea, unspecified: Secondary | ICD-10-CM | POA: Diagnosis not present

## 2022-11-05 DIAGNOSIS — Z5111 Encounter for antineoplastic chemotherapy: Secondary | ICD-10-CM | POA: Diagnosis not present

## 2022-11-05 DIAGNOSIS — C182 Malignant neoplasm of ascending colon: Secondary | ICD-10-CM | POA: Diagnosis not present

## 2022-11-05 DIAGNOSIS — F1010 Alcohol abuse, uncomplicated: Secondary | ICD-10-CM | POA: Diagnosis not present

## 2022-11-05 MED ORDER — HEPARIN SOD (PORK) LOCK FLUSH 100 UNIT/ML IV SOLN
500.0000 [IU] | Freq: Once | INTRAVENOUS | Status: AC | PRN
  Administered 2022-11-05: 500 [IU]

## 2022-11-05 MED ORDER — SODIUM CHLORIDE 0.9% FLUSH
10.0000 mL | INTRAVENOUS | Status: DC | PRN
  Administered 2022-11-05: 10 mL

## 2022-11-07 ENCOUNTER — Encounter (HOSPITAL_COMMUNITY): Payer: Self-pay | Admitting: Hematology

## 2022-11-11 DIAGNOSIS — G8929 Other chronic pain: Secondary | ICD-10-CM | POA: Diagnosis not present

## 2022-11-11 DIAGNOSIS — M5136 Other intervertebral disc degeneration, lumbar region: Secondary | ICD-10-CM | POA: Diagnosis not present

## 2022-11-11 DIAGNOSIS — G894 Chronic pain syndrome: Secondary | ICD-10-CM | POA: Diagnosis not present

## 2022-11-11 DIAGNOSIS — M542 Cervicalgia: Secondary | ICD-10-CM | POA: Diagnosis not present

## 2022-11-11 DIAGNOSIS — M5416 Radiculopathy, lumbar region: Secondary | ICD-10-CM | POA: Diagnosis not present

## 2022-11-14 MED FILL — Dexamethasone Sodium Phosphate Inj 100 MG/10ML: INTRAMUSCULAR | Qty: 1 | Status: AC

## 2022-11-16 NOTE — Assessment & Plan Note (Addendum)
Stage IIIC (pT4a, pN2b, cM0), MSS -diagnosed in 07/2021 -s/p partial colectomy by Dr. Rosendo Gros 07/23/21, final path showed: pT4a lesion with LVI and PNI, no perforation; clear margins; 9/19 positive LNs, pN2b --He received Capox 09/11/21 - 01/15/22, oxali discontinued due to significant nausea/vomiting and headaches. He completed Xeloda in late June 2023.  -He unfortunately developed oligo liver metastasis based on PET/CT in 08/2022, liver biopsy was attempted but did not feasible.  -Guardian review was positive for circulating tumor DNA, consistent with recurrence. -Patient requested second opinion of surgery, was seen at [facility].  Neoadjuvant chemotherapy recommended before surgical resection, and he agreed. -He started chemotherapy FOLFIRI on 10/20/2022, no bevacizumab due to pending surgery  -NGS [facility] showed no targetable therapy, KRAS/NRAS/BRAF wild type, would not recommend EGFR inhibitor for now due to his primary right side colon cancer.

## 2022-11-17 ENCOUNTER — Other Ambulatory Visit: Payer: Self-pay

## 2022-11-17 ENCOUNTER — Telehealth: Payer: Self-pay | Admitting: Hematology

## 2022-11-17 ENCOUNTER — Encounter: Payer: Self-pay | Admitting: Nurse Practitioner

## 2022-11-17 ENCOUNTER — Inpatient Hospital Stay (HOSPITAL_BASED_OUTPATIENT_CLINIC_OR_DEPARTMENT_OTHER): Payer: Medicaid Other | Admitting: Hematology

## 2022-11-17 ENCOUNTER — Encounter: Payer: Self-pay | Admitting: Hematology

## 2022-11-17 ENCOUNTER — Inpatient Hospital Stay: Payer: Medicaid Other

## 2022-11-17 VITALS — BP 138/99 | HR 96 | Temp 98.2°F | Resp 18 | Ht 69.0 in | Wt 184.0 lb

## 2022-11-17 DIAGNOSIS — Z5111 Encounter for antineoplastic chemotherapy: Secondary | ICD-10-CM | POA: Diagnosis not present

## 2022-11-17 DIAGNOSIS — K219 Gastro-esophageal reflux disease without esophagitis: Secondary | ICD-10-CM | POA: Diagnosis not present

## 2022-11-17 DIAGNOSIS — C182 Malignant neoplasm of ascending colon: Secondary | ICD-10-CM | POA: Diagnosis not present

## 2022-11-17 DIAGNOSIS — R112 Nausea with vomiting, unspecified: Secondary | ICD-10-CM | POA: Diagnosis not present

## 2022-11-17 DIAGNOSIS — Z79899 Other long term (current) drug therapy: Secondary | ICD-10-CM | POA: Diagnosis not present

## 2022-11-17 DIAGNOSIS — C787 Secondary malignant neoplasm of liver and intrahepatic bile duct: Secondary | ICD-10-CM | POA: Diagnosis not present

## 2022-11-17 DIAGNOSIS — F1010 Alcohol abuse, uncomplicated: Secondary | ICD-10-CM | POA: Diagnosis not present

## 2022-11-17 DIAGNOSIS — R197 Diarrhea, unspecified: Secondary | ICD-10-CM | POA: Diagnosis not present

## 2022-11-17 DIAGNOSIS — Z95828 Presence of other vascular implants and grafts: Secondary | ICD-10-CM

## 2022-11-17 DIAGNOSIS — D500 Iron deficiency anemia secondary to blood loss (chronic): Secondary | ICD-10-CM

## 2022-11-17 DIAGNOSIS — R634 Abnormal weight loss: Secondary | ICD-10-CM | POA: Diagnosis not present

## 2022-11-17 DIAGNOSIS — K6389 Other specified diseases of intestine: Secondary | ICD-10-CM | POA: Diagnosis not present

## 2022-11-17 LAB — CMP (CANCER CENTER ONLY)
ALT: 12 U/L (ref 0–44)
AST: 15 U/L (ref 15–41)
Albumin: 4.2 g/dL (ref 3.5–5.0)
Alkaline Phosphatase: 114 U/L (ref 38–126)
Anion gap: 7 (ref 5–15)
BUN: 11 mg/dL (ref 6–20)
CO2: 29 mmol/L (ref 22–32)
Calcium: 9.3 mg/dL (ref 8.9–10.3)
Chloride: 102 mmol/L (ref 98–111)
Creatinine: 1.01 mg/dL (ref 0.61–1.24)
GFR, Estimated: >60 mL/min (ref >60)
Glucose, Bld: 107 mg/dL — ABNORMAL HIGH (ref 70–99)
Potassium: 4.1 mmol/L (ref 3.5–5.1)
Sodium: 138 mmol/L (ref 135–145)
Total Bilirubin: 0.4 mg/dL (ref 0.3–1.2)
Total Protein: 7.9 g/dL (ref 6.5–8.1)

## 2022-11-17 LAB — CBC WITH DIFFERENTIAL (CANCER CENTER ONLY)
Abs Immature Granulocytes: 0.05 10*3/uL (ref 0.00–0.07)
Basophils Absolute: 0 10*3/uL (ref 0.0–0.1)
Basophils Relative: 1 %
Eosinophils Absolute: 0.1 10*3/uL (ref 0.0–0.5)
Eosinophils Relative: 2 %
HCT: 35 % — ABNORMAL LOW (ref 39.0–52.0)
Hemoglobin: 11.9 g/dL — ABNORMAL LOW (ref 13.0–17.0)
Immature Granulocytes: 1 %
Lymphocytes Relative: 37 %
Lymphs Abs: 2 10*3/uL (ref 0.7–4.0)
MCH: 29.3 pg (ref 26.0–34.0)
MCHC: 34 g/dL (ref 30.0–36.0)
MCV: 86.2 fL (ref 80.0–100.0)
Monocytes Absolute: 0.5 10*3/uL (ref 0.1–1.0)
Monocytes Relative: 9 %
Neutro Abs: 2.7 10*3/uL (ref 1.7–7.7)
Neutrophils Relative %: 50 %
Platelet Count: 275 10*3/uL (ref 150–400)
RBC: 4.06 MIL/uL — ABNORMAL LOW (ref 4.22–5.81)
RDW: 13.2 % (ref 11.5–15.5)
WBC Count: 5.4 10*3/uL (ref 4.0–10.5)
nRBC: 0 % (ref 0.0–0.2)

## 2022-11-17 MED ORDER — DEXAMETHASONE 4 MG PO TABS: 4.0000 mg | ORAL_TABLET | Freq: Every day | ORAL | 1 refills | Status: DC

## 2022-11-17 MED ORDER — ATROPINE SULFATE 1 MG/ML IV SOLN
0.5000 mg | Freq: Once | INTRAVENOUS | Status: AC | PRN
  Administered 2022-11-17: 0.5 mg via INTRAVENOUS
  Filled 2022-11-17: qty 1

## 2022-11-17 MED ORDER — SODIUM CHLORIDE 0.9 % IV SOLN
150.0000 mg | Freq: Once | INTRAVENOUS | Status: AC
  Administered 2022-11-17: 150 mg via INTRAVENOUS
  Filled 2022-11-17: qty 150

## 2022-11-17 MED ORDER — SODIUM CHLORIDE 0.9 % IV SOLN
180.0000 mg/m2 | Freq: Once | INTRAVENOUS | Status: AC
  Administered 2022-11-17: 400 mg via INTRAVENOUS
  Filled 2022-11-17: qty 15

## 2022-11-17 MED ORDER — SODIUM CHLORIDE 0.9 % IV SOLN
2400.0000 mg/m2 | INTRAVENOUS | Status: DC
  Administered 2022-11-17: 5000 mg via INTRAVENOUS
  Filled 2022-11-17: qty 100

## 2022-11-17 MED ORDER — SODIUM CHLORIDE 0.9% FLUSH
10.0000 mL | Freq: Once | INTRAVENOUS | Status: AC
  Administered 2022-11-17: 10 mL

## 2022-11-17 MED ORDER — SODIUM CHLORIDE 0.9 % IV SOLN
10.0000 mg | Freq: Once | INTRAVENOUS | Status: AC
  Administered 2022-11-17: 10 mg via INTRAVENOUS
  Filled 2022-11-17: qty 10

## 2022-11-17 MED ORDER — SODIUM CHLORIDE 0.9 % IV SOLN: Freq: Once | INTRAVENOUS | Status: AC

## 2022-11-17 MED ORDER — SODIUM CHLORIDE 0.9 % IV SOLN
400.0000 mg/m2 | Freq: Once | INTRAVENOUS | Status: AC
  Administered 2022-11-17: 804 mg via INTRAVENOUS
  Filled 2022-11-17: qty 40.2

## 2022-11-17 MED ORDER — FLUOROURACIL CHEMO INJECTION 2.5 GM/50ML
400.0000 mg/m2 | Freq: Once | INTRAVENOUS | Status: AC
  Administered 2022-11-17: 800 mg via INTRAVENOUS
  Filled 2022-11-17: qty 16

## 2022-11-17 MED ORDER — PALONOSETRON HCL INJECTION 0.25 MG/5ML
0.2500 mg | Freq: Once | INTRAVENOUS | Status: AC
  Administered 2022-11-17: 0.25 mg via INTRAVENOUS
  Filled 2022-11-17: qty 5

## 2022-11-17 NOTE — Telephone Encounter (Signed)
Per 3/18 IB reached out to patient to setup appointment.patient aware of date and time of appointment.

## 2022-11-17 NOTE — Patient Instructions (Signed)
[facility]  Discharge Instructions: Thank you for choosing [facility] to provide your oncology and hematology care.   If you have a lab appointment with the [facility], please go directly to the [facility] and check in at the registration area.   Wear comfortable clothing and clothing appropriate for easy access to any Portacath or PICC line.   We strive to give you quality time with your provider. You may need to reschedule your appointment if you arrive late (15 or more minutes).  Arriving late affects you and other patients whose appointments are after yours.  Also, if you miss three or more appointments without notifying the office, you may be dismissed from the clinic at the provider's discretion.      For prescription refill requests, have your pharmacy contact our office and allow 72 hours for refills to be completed.    Today you received the following chemotherapy and/or immunotherapy agents: irinotecan, leucovorin, and fluorouracil      To help prevent nausea and vomiting after your treatment, we encourage you to take your nausea medication as directed.  BELOW ARE SYMPTOMS THAT SHOULD BE REPORTED IMMEDIATELY: *FEVER GREATER THAN 100.4 F (38 C) OR HIGHER *CHILLS OR SWEATING *NAUSEA AND VOMITING THAT IS NOT CONTROLLED WITH YOUR NAUSEA MEDICATION *UNUSUAL SHORTNESS OF BREATH *UNUSUAL BRUISING OR BLEEDING *URINARY PROBLEMS (pain or burning when urinating, or frequent urination) *BOWEL PROBLEMS (unusual diarrhea, constipation, pain near the anus) TENDERNESS IN MOUTH AND THROAT WITH OR WITHOUT PRESENCE OF ULCERS (sore throat, sores in mouth, or a toothache) UNUSUAL RASH, SWELLING OR PAIN  UNUSUAL VAGINAL DISCHARGE OR ITCHING   Items with * indicate a potential emergency and should be followed up as soon as possible or go to the Emergency Department if any problems should occur.  Please show the CHEMOTHERAPY ALERT CARD or  IMMUNOTHERAPY ALERT CARD at check-in to the Emergency Department and triage nurse.  Should you have questions after your visit or need to cancel or reschedule your appointment, please contact [facility]  Dept: [phone]  and follow the prompts.  Office hours are 8:00 a.m. to 4:30 p.m. Monday - Friday. Please note that voicemails left after 4:00 p.m. may not be returned until the following business day.  We are closed weekends and major holidays. You have access to a nurse at all times for urgent questions. Please call the main number to the clinic Dept: [phone] and follow the prompts.   For any non-urgent questions, you may also contact your provider using MyChart. We now offer e-Visits for anyone 31 and older to request care online for non-urgent symptoms. For details visit [email or web address].   Also download the MyChart app! Go to the app store, search "MyChart", open the app, select [facility], and log in with your MyChart username and password.

## 2022-11-17 NOTE — Progress Notes (Signed)
[place]   [phone] [phone]   Clinic Follow up Note   Patient Care Team: Erby Pian, [name] as PCP - General (Physician Assistant) Ralene Ok, MD as Consulting Physician (General Surgery) Alla Feeling, NP as Nurse Practitioner (Nurse Practitioner) Truitt Merle, MD as Consulting Physician (Hematology) Mohammed Kindle, MD as Referring Physician (Pain Medicine)  Date of Service:  11/17/2022  CHIEF COMPLAINT: f/u of  recurrent/metastatic colon cancer   CURRENT THERAPY: Neoadjuvant FOLFIRI q14 days, starting 10/20/22    ASSESSMENT:  Joseph Haney is a 47 y.o. 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 Rosendo Gros 07/23/21, final path showed: pT4a lesion with LVI and PNI, no perforation; clear margins; 9/19 positive LNs, pN2b --He received Capox 09/11/21 - 01/15/22, oxali discontinued due to significant nausea/vomiting and headaches. He completed Xeloda in late June 2023.  -He unfortunately developed oligo liver metastasis based on PET/CT in 08/2022, liver biopsy was attempted but did not feasible.  -Guardian review was positive for circulating tumor DNA, consistent with recurrence. -Patient requested second opinion of surgery, was seen at [facility].  Neoadjuvant chemotherapy recommended before surgical resection, and he agreed. -He started chemotherapy FOLFIRI on 10/20/2022, no bevacizumab due to pending surgery  -NGS [facility] showed no targetable therapy, KRAS/NRAS/BRAF wild type, would not recommend EGFR inhibitor for now due to his primary right side colon cancer.   Nausea and diarrhea  -overall mild, we have added iv Emend as premeds -we reviewed antiemetics, I called in dexa for 3-5 days  -continue immodium, and increase dose   Weight loss -f/u with dietician   PLAN:  -I refill Dexamethasone. -labs reviewed -proceed with C3 Folfiri same dose  today -lab/flush,f/u and Folfiri 12/02/2022  SUMMARY OF ONCOLOGIC HISTORY: Oncology History Overview Note   Cancer Staging  Cancer of right colon [facility]) Staging form: Colon and Rectum, AJCC 8th Edition - Pathologic stage from 07/23/2021: Stage IIIC (pT4a, pN2b, cM0) - Signed by Alla Feeling, NP on 08/15/2021 Stage prefix: Initial diagnosis Histologic grading system: 4 grade system Histologic grade (G): G2 Laterality: Right Lymph-vascular invasion (LVI): LVI present/identified, NOS Perineural invasion (PNI): Present Microsatellite instability (MSI): Stable     Cancer of right colon ([place])  07/19/2021 Imaging   CT AP IMPRESSION: Focal area of high-grade narrowing of the proximal ascending colon may be sequela of chronic inflammation and stricture versus a mass. There is mild distension of the cecum and terminal ileum. Clinical correlation and further evaluation with lower GI study or colonoscopy recommended.   07/22/2021 Procedure   Colonoscopy A severe stenosis was found in the distal ascending colon and was non-traversed. It was ulcerated and inflamed. This could be inflammatory versus malignant stricture.   07/22/2021 Initial Biopsy   FINAL MICROSCOPIC DIAGNOSIS:  A. COLON, ASCENDING, BIOPSY:  - Invasive colorectal adenocarcinoma, moderately differentiated.    07/23/2021 Definitive Surgery   PRE-OPERATIVE DIAGNOSIS:  right colon mass POST-OPERATIVE DIAGNOSIS:  right colon mass PROCEDURE:  Procedure(s): XI ROBOT ASSISTED LAPAROSCOPIC RIGHT COLECTOMY (N/A) Dr. Rosendo Gros   07/23/2021 Pathology Results   FINAL MICROSCOPIC DIAGNOSIS:  A. COLON, RIGHT, RESECTION:  - Invasive moderately differentiated colonic adenocarcinoma, 4.5 cm.  - Carcinoma extends into pericolonic connective tissue and focally  involves serosal surface.  - Margins not involved.  - Metastatic carcinoma in nine of nineteen lymph nodes (9/19).  - Unremarkable appendix. MMR normal, MSI-stable     07/23/2021 Cancer Staging  Staging form: Colon and Rectum, AJCC 8th Edition - Pathologic stage from 07/23/2021: Stage IIIC (pT4a, pN2b, cM0) - Signed by Alla Feeling, NP on 08/15/2021 Stage prefix: Initial diagnosis Histologic grading system: 4 grade system Histologic grade (G): G2 Laterality: Right Lymph-vascular invasion (LVI): LVI present/identified, NOS Perineural invasion (PNI): Present Microsatellite instability (MSI): Stable   07/24/2021 Initial Diagnosis   Colon adenocarcinoma ([facility] [place] [facility])   07/25/2021 Tumor Marker   CEA: 2.9   07/28/2021 Imaging   CT chest IMPRESSION: 1. No evidence of metastatic disease within the chest. 2. Lungs demonstrate atelectasis, most evident lower lobes and bases of the left upper lobe lingula and right middle lobe. No convincing pneumonia and no evidence of pulmonary edema.     09/11/2021 - 01/15/2022 Chemotherapy   Patient is on Treatment Plan : COLORECTAL Xelox ([place]ox) q21d     08/2022 Progression   PET scan showed hypermetabolic oligo liver metastasis, biopsy attempted but liver lesion was not seen by [place]. GuardantReveal was also positive for circulating tumor DNA    09/02/2022 - 09/02/2022 Chemotherapy   Patient is on Treatment Plan : COLORECTAL FOLFIRI + Bevacizumab q14d      Genetic Testing   Negative genetic testing. No pathogenic variants identified on the [facility] Multi-Cancer+RNA panel. VUS in APC called [id number] identified. The report date is 09/15/2022.  The Multi-Cancer + RNA Panel offered by [facility] includes sequencing and/or deletion/duplication analysis of the following 70 genes:  AIP*, ALK, APC*, ATM*, AXIN2*, BAP1*, BARD1*, BLM*, BMPR1A*, BRCA1*, BRCA2*, BRIP1*, CDC73*, CDH1*, CDK4, CDKN1B*, CDKN2A, CHEK2*, CTNNA1*, DICER1*, EPCAM, EGFR, FH*, FLCN*, GREM1, HOXB13, KIT, LZTR1, MAX*, MBD4, MEN1*, MET, MITF, MLH1*, MSH2*, MSH3*, MSH6*, MUTYH*, NF1*, NF2*, NTHL1*, PALB2*, PDGFRA, PMS2*, POLD1*, POLE*, POT1*, PRKAR1A*, PTCH1*, PTEN*,  RAD51C*, RAD51D*, RB1*, RET, SDHA*, SDHAF2*, SDHB*, SDHC*, SDHD*, SMAD4*, SMARCA4*, SMARCB1*, SMARCE1*, STK11*, SUFU*, TMEM127*, TP53*, TSC1*, TSC2*, VHL*. RNA analysis is performed for * genes.   10/20/2022 -  Chemotherapy   Patient is on Treatment Plan : COLORECTAL FOLFIRI q14d        INTERVAL HISTORY:  Joseph Haney is here for a follow up of  recurrent/metastatic colon cancer . He was last seen by  NP Lacie   on 11/03/2022 He presents to the clinic alone.Pt reports he had some nausea. Pt state after treatment he gets nausea and it last for 2-3 days. Then he states it  comes every other day.Pt reports of having diarrhea 2 times a day.Pt has been taking Imodium.    All other systems were reviewed with the patient and are negative.  MEDICAL HISTORY:  Past Medical History:  Diagnosis Date   Alcohol abuse    Cancer ([place])    GERD (gastroesophageal reflux disease)    Seizure ([place])     SURGICAL HISTORY: Past Surgical History:  Procedure Laterality Date   BIOPSY  07/22/2021   Procedure: BIOPSY;  Surgeon: [name], MD;  Location: [facility];  Service: Gastroenterology;;   COLON SURGERY     COLONOSCOPY WITH PROPOFOL N/A 07/22/2021   Procedure: COLONOSCOPY WITH PROPOFOL;  Surgeon: [name], MD;  Location: [facility];  Service: Gastroenterology;  Laterality: N/A;   IR IMAGING GUIDED PORT INSERTION  10/17/2022   NO PAST SURGERIES     SUBMUCOSAL TATTOO INJECTION  07/22/2021   Procedure: SUBMUCOSAL TATTOO INJECTION;  Surgeon: [name], MD;  Location: [facility];  Service: Gastroenterology;;    I have reviewed the social history and family history with the patient and they are unchanged from previous note.  ALLERGIES:  is allergic to latex and morphine and related.  MEDICATIONS:  Current Outpatient Medications  Medication Sig Dispense Refill   carvedilol (COREG) 25 MG tablet Take 1 tablet (25 mg total) by mouth 2 (two) times daily with a meal. 30 tablet  0   cyclobenzaprine (FLEXERIL) 10 MG tablet Take 10-20 mg by mouth 2 (two) times daily as needed for muscle spasms.     dexamethasone (DECADRON) 4 MG tablet Take 1 tablet (4 mg total) by mouth daily. Take for 3-5 days after chemo for nausea and fatigue 20 tablet 1   docusate sodium (COLACE) 100 MG capsule Take 100 mg by mouth daily.     escitalopram (LEXAPRO) 10 MG tablet Take 10 mg by mouth daily.     ferrous sulfate 325 (65 FE) MG tablet Take 1 tablet (325 mg total) by mouth 2 (two) times daily with a meal. 100 tablet 3   gabapentin (NEURONTIN) 400 MG capsule Take 400 mg by mouth every 6 (six) hours as needed.     HYDROcodone-acetaminophen (NORCO/VICODIN) 5-325 MG tablet Take 1 tablet by mouth every 6 (six) hours as needed for moderate pain.     lidocaine-prilocaine (EMLA) cream Apply to affected area once 30 g 3   ondansetron (ZOFRAN) 8 MG tablet Take 1 tablet (8 mg total) by mouth every 8 (eight) hours as needed for nausea, vomiting or refractory nausea / vomiting. Start on the third day after chemotherapy. 30 tablet 1   oxyCODONE (ROXICODONE) 5 MG immediate release tablet Take 1 tablet (5 mg total) by mouth every 6 (six) hours as needed for severe pain. 20 tablet 0   polyethylene glycol (MIRALAX / GLYCOLAX) 17 g packet Take 17 g by mouth daily as needed for mild constipation or moderate constipation. 14 each 0   potassium chloride SA (KLOR-CON M) 20 MEQ tablet Take 1 tablet (20 mEq total) by mouth daily for 5 days. 5 tablet 0   prochlorperazine (COMPAZINE) 10 MG tablet Take 1 tablet (10 mg total) by mouth every 6 (six) hours as needed for nausea or vomiting. 30 tablet 1   promethazine (PHENERGAN) 25 MG tablet Take 1 tablet (25 mg total) by mouth every 6 (six) hours as needed for nausea or vomiting. 30 tablet 1   triamcinolone cream (KENALOG) 0.1 % Apply 1 Application topically daily as needed (for rash and itching).     No current facility-administered medications for this visit.    PHYSICAL  EXAMINATION: ECOG PERFORMANCE STATUS: 1 - Symptomatic but completely ambulatory  Vitals:   11/17/22 0946  BP: (!) 138/99  Pulse: 96  Resp: 18  Temp: 98.2 F (36.8 C)  SpO2: 100%   Wt Readings from Last 3 Encounters:  11/17/22 184 lb (83.5 kg)  11/03/22 188 lb 11.2 oz (85.6 kg)  10/20/22 184 lb (83.5 kg)     GENERAL:alert, no distress and comfortable SKIN: skin color normal, no rashes or significant lesions EYES: normal, Conjunctiva are pink and non-injected, sclera clear  NEURO: alert & oriented x 3 with fluent speech   LABORATORY DATA:  I have reviewed the data as listed    Latest Ref Rng & Units 11/17/2022    9:29 AM 11/03/2022    9:59 AM 10/20/2022    8:48 AM  CBC  WBC 4.0 - 10.5 K/uL 5.4  5.2  11.9   Hemoglobin 13.0 - 17.0 g/dL 11.9  11.3  12.8   Hematocrit 39.0 - 52.0 % 35.0  33.8  38.0  Platelets 150 - 400 K/uL 275  261  322         Latest Ref Rng & Units 11/03/2022    9:59 AM 10/20/2022    8:48 AM 09/08/2022    1:28 PM  CMP  Glucose 70 - 99 mg/dL 102  107  99   BUN 6 - 20 mg/dL 10  20  10    Creatinine 0.61 - 1.24 mg/dL 0.89  0.86  0.78   Sodium 135 - 145 mmol/L 141  137  138   Potassium 3.5 - 5.1 mmol/L 3.9  3.7  3.7   Chloride 98 - 111 mmol/L 106  105  106   CO2 22 - 32 mmol/L 29  24  24    Calcium 8.9 - 10.3 mg/dL 9.0  9.0  9.7   Total Protein 6.5 - 8.1 g/dL 7.3  7.8  8.0   Total Bilirubin 0.3 - 1.2 mg/dL 0.3  0.3  0.4   Alkaline Phos 38 - 126 U/L 107  76  91   AST 15 - 41 U/L 20  16  18    ALT 0 - 44 U/L 24  28  13        RADIOGRAPHIC STUDIES: I have personally reviewed the radiological images as listed and agreed with the findings in the report. No results found.    Orders Placed This Encounter  Procedures   CBC with Differential ([place] Only)    Standing Status:   Future    Standing Expiration Date:   12/15/2023   CMP ([place] only)    Standing Status:   Future    Standing Expiration Date:   12/15/2023   CBC with Differential  ([place] Only)    Standing Status:   Future    Standing Expiration Date:   12/29/2023   CMP ([place] only)    Standing Status:   Future    Standing Expiration Date:   12/29/2023   All questions were answered. The patient knows to call the clinic with any problems, questions or concerns. No barriers to learning was detected. The total time spent in the appointment was 30 minutes.     Truitt Merle, MD 11/17/2022   Felicity Coyer, CMA, am acting as scribe for Truitt Merle, MD.   I have reviewed the above documentation for accuracy and completeness, and I agree with the above.

## 2022-11-18 ENCOUNTER — Inpatient Hospital Stay: Payer: Medicaid Other | Admitting: Dietician

## 2022-11-18 DIAGNOSIS — C182 Malignant neoplasm of ascending colon: Secondary | ICD-10-CM | POA: Diagnosis not present

## 2022-11-18 NOTE — Progress Notes (Signed)
Patient did not show for nutrition appointment. Rescheduled visit for 4/2 during infusion.

## 2022-11-19 ENCOUNTER — Inpatient Hospital Stay: Payer: Medicaid Other

## 2022-11-19 ENCOUNTER — Other Ambulatory Visit: Payer: Self-pay

## 2022-11-19 VITALS — BP 136/93 | HR 79 | Temp 98.7°F | Resp 20

## 2022-11-19 DIAGNOSIS — F1010 Alcohol abuse, uncomplicated: Secondary | ICD-10-CM | POA: Diagnosis not present

## 2022-11-19 DIAGNOSIS — R634 Abnormal weight loss: Secondary | ICD-10-CM | POA: Diagnosis not present

## 2022-11-19 DIAGNOSIS — C787 Secondary malignant neoplasm of liver and intrahepatic bile duct: Secondary | ICD-10-CM | POA: Diagnosis not present

## 2022-11-19 DIAGNOSIS — C182 Malignant neoplasm of ascending colon: Secondary | ICD-10-CM | POA: Diagnosis not present

## 2022-11-19 DIAGNOSIS — Z5111 Encounter for antineoplastic chemotherapy: Secondary | ICD-10-CM | POA: Diagnosis not present

## 2022-11-19 DIAGNOSIS — D500 Iron deficiency anemia secondary to blood loss (chronic): Secondary | ICD-10-CM

## 2022-11-19 DIAGNOSIS — R197 Diarrhea, unspecified: Secondary | ICD-10-CM | POA: Diagnosis not present

## 2022-11-19 DIAGNOSIS — R112 Nausea with vomiting, unspecified: Secondary | ICD-10-CM | POA: Diagnosis not present

## 2022-11-19 DIAGNOSIS — Z79899 Other long term (current) drug therapy: Secondary | ICD-10-CM | POA: Diagnosis not present

## 2022-11-19 DIAGNOSIS — Z95828 Presence of other vascular implants and grafts: Secondary | ICD-10-CM

## 2022-11-19 DIAGNOSIS — K219 Gastro-esophageal reflux disease without esophagitis: Secondary | ICD-10-CM | POA: Diagnosis not present

## 2022-11-19 DIAGNOSIS — K6389 Other specified diseases of intestine: Secondary | ICD-10-CM | POA: Diagnosis not present

## 2022-11-19 MED ORDER — HEPARIN SOD (PORK) LOCK FLUSH 100 UNIT/ML IV SOLN
500.0000 [IU] | Freq: Once | INTRAVENOUS | Status: AC
  Administered 2022-11-19: 500 [IU]

## 2022-11-19 MED ORDER — SODIUM CHLORIDE 0.9% FLUSH
10.0000 mL | Freq: Once | INTRAVENOUS | Status: AC
  Administered 2022-11-19: 10 mL

## 2022-11-24 ENCOUNTER — Telehealth: Payer: Self-pay

## 2022-11-24 ENCOUNTER — Other Ambulatory Visit (HOSPITAL_COMMUNITY): Payer: Self-pay

## 2022-11-24 ENCOUNTER — Other Ambulatory Visit: Payer: Self-pay

## 2022-11-24 MED ORDER — NYSTATIN 100000 UNIT/ML MT SUSP
5.0000 mL | Freq: Three times a day (TID) | OROMUCOSAL | 1 refills | Status: DC | PRN
  Filled 2022-11-24: qty 140, 10d supply, fill #0

## 2022-11-24 NOTE — Telephone Encounter (Signed)
Pt called stating he's experiencing white patches inside his mouth as well as pain.  Pt stated he's been gargling with baking soda and water but it's not helping.  Pt requesting something for his mouth.  Prescription for Magic Mouthwash sent to [facility] for pt to start taking.  Informed pt that prescription will not be compounded until his or spouse's arrival to the pharmacy for pick-up.  Pt verbalized understanding and had no further questions or concerns at this time.

## 2022-12-01 MED FILL — Dexamethasone Sodium Phosphate Inj 100 MG/10ML: INTRAMUSCULAR | Qty: 1 | Status: AC

## 2022-12-01 MED FILL — Fosaprepitant Dimeglumine For IV Infusion 150 MG (Base Eq): INTRAVENOUS | Qty: 5 | Status: AC

## 2022-12-01 NOTE — Progress Notes (Unsigned)
Patient Care Team: Erby Pian, [name] as PCP - General (Physician Assistant) Ralene Ok, MD as Consulting Physician (General Surgery) Alla Feeling, NP as Nurse Practitioner (Nurse Practitioner) Truitt Merle, MD as Consulting Physician (Hematology) Mohammed Kindle, MD as Referring Physician (Pain Medicine)   CHIEF COMPLAINT: Follow-up recurrent colon cancer  Oncology History Overview Note   Cancer Staging  Cancer of right colon [facility]) Staging form: Colon and Rectum, AJCC 8th Edition - Pathologic stage from 07/23/2021: Stage IIIC (pT4a, pN2b, cM0) - Signed by Alla Feeling, NP on 08/15/2021 Stage prefix: Initial diagnosis Histologic grading system: 4 grade system Histologic grade (G): G2 Laterality: Right Lymph-vascular invasion (LVI): LVI present/identified, NOS Perineural invasion (PNI): Present Microsatellite instability (MSI): Stable     Cancer of right colon  07/19/2021 Imaging   CT AP IMPRESSION: Focal area of high-grade narrowing of the proximal ascending colon may be sequela of chronic inflammation and stricture versus a mass. There is mild distension of the cecum and terminal ileum. Clinical correlation and further evaluation with lower GI study or colonoscopy recommended.   07/22/2021 Procedure   Colonoscopy A severe stenosis was found in the distal ascending colon and was non-traversed. It was ulcerated and inflamed. This could be inflammatory versus malignant stricture.   07/22/2021 Initial Biopsy   FINAL MICROSCOPIC DIAGNOSIS:  A. COLON, ASCENDING, BIOPSY:  - Invasive colorectal adenocarcinoma, moderately differentiated.    07/23/2021 Definitive Surgery   PRE-OPERATIVE DIAGNOSIS:  right colon mass POST-OPERATIVE DIAGNOSIS:  right colon mass PROCEDURE:  Procedure(s): XI ROBOT ASSISTED LAPAROSCOPIC RIGHT COLECTOMY (N/A) Dr. Rosendo Gros   07/23/2021 Pathology Results   FINAL MICROSCOPIC DIAGNOSIS:  A. COLON, RIGHT, RESECTION:  - Invasive  moderately differentiated colonic adenocarcinoma, 4.5 cm.  - Carcinoma extends into pericolonic connective tissue and focally  involves serosal surface.  - Margins not involved.  - Metastatic carcinoma in nine of nineteen lymph nodes (9/19).  - Unremarkable appendix. MMR normal, MSI-stable    07/23/2021 Cancer Staging   Staging form: Colon and Rectum, AJCC 8th Edition - Pathologic stage from 07/23/2021: Stage IIIC (pT4a, pN2b, cM0) - Signed by Alla Feeling, NP on 08/15/2021 Stage prefix: Initial diagnosis Histologic grading system: 4 grade system Histologic grade (G): G2 Laterality: Right Lymph-vascular invasion (LVI): LVI present/identified, NOS Perineural invasion (PNI): Present Microsatellite instability (MSI): Stable   07/24/2021 Initial Diagnosis   Colon adenocarcinoma ([place])   07/25/2021 Tumor Marker   CEA: 2.9   07/28/2021 Imaging   CT chest IMPRESSION: 1. No evidence of metastatic disease within the chest. 2. Lungs demonstrate atelectasis, most evident lower lobes and bases of the left upper lobe lingula and right middle lobe. No convincing pneumonia and no evidence of pulmonary edema.     09/11/2021 - 01/15/2022 Chemotherapy   Patient is on Treatment Plan : COLORECTAL Xelox ([place]apeox) q21d     08/2022 Progression   PET scan showed hypermetabolic oligo liver metastasis, biopsy attempted but liver lesion was not seen by [place]. GuardantReveal was also positive for circulating tumor DNA    09/02/2022 - 09/02/2022 Chemotherapy   Patient is on Treatment Plan : COLORECTAL FOLFIRI + Bevacizumab q14d      Genetic Testing   Negative genetic testing. No pathogenic variants identified on the [facility] Multi-Cancer+RNA panel. VUS in APC called [id number] identified. The report date is 09/15/2022.  The Multi-Cancer + RNA Panel offered by [facility] includes sequencing and/or deletion/duplication analysis of the following 70 genes:  AIP*, ALK, APC*, ATM*, AXIN2*, BAP1*, BARD1*, BLM*,  BMPR1A*,  BRCA1*, BRCA2*, BRIP1*, CDC73*, CDH1*, CDK4, CDKN1B*, CDKN2A, CHEK2*, CTNNA1*, DICER1*, EPCAM, EGFR, FH*, FLCN*, GREM1, HOXB13, KIT, LZTR1, MAX*, MBD4, MEN1*, MET, MITF, MLH1*, MSH2*, MSH3*, MSH6*, MUTYH*, NF1*, NF2*, NTHL1*, PALB2*, PDGFRA, PMS2*, POLD1*, POLE*, POT1*, PRKAR1A*, PTCH1*, PTEN*, RAD51C*, RAD51D*, RB1*, RET, SDHA*, SDHAF2*, SDHB*, SDHC*, SDHD*, SMAD4*, SMARCA4*, SMARCB1*, SMARCE1*, STK11*, SUFU*, TMEM127*, TP53*, TSC1*, TSC2*, VHL*. RNA analysis is performed for * genes.   10/20/2022 -  Chemotherapy   Patient is on Treatment Plan : COLORECTAL FOLFIRI q14d        CURRENT THERAPY: Neoadjuvant FOLFIRI q14 days, starting 10/20/22   INTERVAL HISTORY Mr Vanson returns for follow-up as scheduled, last seen by Dr. Burr Medico 11/17/2022 and completed cycle 3 FOLFIRI.  He had cramping and diarrhea for 3-4 days, partially managed with Imodium which helped eventually.  He had nausea without vomiting for 3-4 days as well, which lowered his oral intake.  Zofran and Compazine were partially effective, he did not pick up Decadron though.  He also had a headache for couple days it eventually resolved.  He developed painful sores on his tongue which limited p.o. intake for 4-5 days.  He tried home rinse first then was prescribed Magic mouthwash.  These eventually resolved and he felt well in the week off treatment.  ROS  All other systems reviewed and negative  Past Medical History:  Diagnosis Date   Alcohol abuse    Cancer ([place])    GERD (gastroesophageal reflux disease)    Seizure ([place])      Past Surgical History:  Procedure Laterality Date   BIOPSY  07/22/2021   Procedure: BIOPSY;  Surgeon: [name], MD;  Location: [facility];  Service: Gastroenterology;;   COLON SURGERY     COLONOSCOPY WITH PROPOFOL N/A 07/22/2021   Procedure: COLONOSCOPY WITH PROPOFOL;  Surgeon: [name], MD;  Location: [facility];  Service: Gastroenterology;  Laterality: N/A;   IR IMAGING GUIDED  PORT INSERTION  10/17/2022   NO PAST SURGERIES     SUBMUCOSAL TATTOO INJECTION  07/22/2021   Procedure: SUBMUCOSAL TATTOO INJECTION;  Surgeon: [name], MD;  Location: [facility];  Service: Gastroenterology;;     Outpatient Encounter Medications as of 12/02/2022  Medication Sig   carvedilol (COREG) 25 MG tablet Take 1 tablet (25 mg total) by mouth 2 (two) times daily with a meal.   cyclobenzaprine (FLEXERIL) 10 MG tablet Take 10-20 mg by mouth 2 (two) times daily as needed for muscle spasms.   diphenoxylate-atropine (LOMOTIL) 2.5-0.025 MG tablet Take 1 tablet by mouth 4 (four) times daily as needed for diarrhea or loose stools.   docusate sodium (COLACE) 100 MG capsule Take 100 mg by mouth daily.   escitalopram (LEXAPRO) 10 MG tablet Take 10 mg by mouth daily.   ferrous sulfate 325 (65 FE) MG tablet Take 1 tablet (325 mg total) by mouth 2 (two) times daily with a meal.   gabapentin (NEURONTIN) 400 MG capsule Take 400 mg by mouth every 6 (six) hours as needed.   HYDROcodone-acetaminophen (NORCO/VICODIN) 5-325 MG tablet Take 1 tablet by mouth every 6 (six) hours as needed for moderate pain.   lidocaine-prilocaine (EMLA) cream Apply to affected area once   ondansetron (ZOFRAN) 8 MG tablet Take 1 tablet (8 mg total) by mouth every 8 (eight) hours as needed for nausea, vomiting or refractory nausea / vomiting. Start on the third day after chemotherapy.   oxyCODONE (ROXICODONE) 5 MG immediate release tablet Take 1 tablet (5 mg total) by mouth every 6 (six)  hours as needed for severe pain.   polyethylene glycol (MIRALAX / GLYCOLAX) 17 g packet Take 17 g by mouth daily as needed for mild constipation or moderate constipation.   promethazine (PHENERGAN) 25 MG tablet Take 1 tablet (25 mg total) by mouth every 6 (six) hours as needed for nausea or vomiting.   triamcinolone cream (KENALOG) 0.1 % Apply 1 Application topically daily as needed (for rash and itching).   [DISCONTINUED] dexamethasone  (DECADRON) 4 MG tablet Take 1 tablet (4 mg total) by mouth daily. Take for 3-5 days after chemo for nausea and fatigue   [DISCONTINUED] magic mouthwash (nystatin, lidocaine, diphenhydrAMINE, alum & mag hydroxide) suspension Take 5 mLs by mouth 3 (three) times daily as needed for mouth pain.   [DISCONTINUED] prochlorperazine (COMPAZINE) 10 MG tablet Take 1 tablet (10 mg total) by mouth every 6 (six) hours as needed for nausea or vomiting.   dexamethasone (DECADRON) 4 MG tablet Take 1 tablet (4 mg total) by mouth daily. Take for 3-5 days after chemo for nausea and fatigue   magic mouthwash (nystatin, lidocaine, diphenhydrAMINE, alum & mag hydroxide) suspension Take 5 mLs by mouth 3 (three) times daily as needed for mouth pain.   potassium chloride SA (KLOR-CON M) 20 MEQ tablet Take 1 tablet (20 mEq total) by mouth daily for 5 days.   prochlorperazine (COMPAZINE) 10 MG tablet Take 1 tablet (10 mg total) by mouth every 6 (six) hours as needed for nausea or vomiting.   No facility-administered encounter medications on file as of 12/02/2022.     Today's Vitals   12/02/22 1002  BP: (!) 146/101  Pulse: 96  Resp: 14  Temp: 98.7 F (37.1 C)  TempSrc: Oral  SpO2: 100%  Weight: 186 lb 12.8 oz (84.7 kg)   Body mass index is 27.59 kg/m.   PHYSICAL EXAM GENERAL:alert, no distress and comfortable SKIN: no rash  EYES: sclera clear LUNGS: normal breathing effort HEART: no lower extremity edema ABDOMEN: abdomen soft, non-tender and normal bowel sounds NEURO: alert & oriented x 3 with fluent speech PAC without erythema    CBC    Component Value Date/Time   WBC 5.1 12/02/2022 0938   WBC 7.1 08/05/2022 0705   RBC 4.08 (L) 12/02/2022 0938   HGB 11.8 (L) 12/02/2022 0938   HCT 34.6 (L) 12/02/2022 0938   PLT 245 12/02/2022 0938   MCV 84.8 12/02/2022 0938   MCH 28.9 12/02/2022 0938   MCHC 34.1 12/02/2022 0938   RDW 13.8 12/02/2022 0938   LYMPHSABS 1.9 12/02/2022 0938   MONOABS 0.4 12/02/2022 0938    EOSABS 0.2 12/02/2022 0938   BASOSABS 0.0 12/02/2022 0938     CMP     Component Value Date/Time   NA 142 12/02/2022 0938   K 3.8 12/02/2022 0938   CL 107 12/02/2022 0938   CO2 27 12/02/2022 0938   GLUCOSE 114 (H) 12/02/2022 0938   BUN 9 12/02/2022 0938   CREATININE 0.82 12/02/2022 0938   CREATININE 0.62 10/21/2012 1530   CALCIUM 9.4 12/02/2022 0938   PROT 7.8 12/02/2022 0938   ALBUMIN 4.3 12/02/2022 0938   AST 15 12/02/2022 0938   ALT 18 12/02/2022 0938   ALKPHOS 99 12/02/2022 0938   BILITOT 0.3 12/02/2022 0938   GFRNONAA >60 12/02/2022 0938   GFRNONAA >89 10/21/2012 1530   GFRAA >60 11/28/2015 0501   GFRAA >89 10/21/2012 1530     ASSESSMENT & PLAN:Jaben Banka is a 47 y.o. male with    Cancer  of right colon, Stage IIIC (pT4a, pN2b, cM0), MSS; recurrence in liver 07/2022; KRAS/NRAS/BRAF wild type, PIK3CA + -diagnosed in 07/2021 -s/p partial colectomy by Dr. Rosendo Gros 07/23/21, final path showed: pT4a lesion with LVI and PNI, no perforation; clear margins; 9/19 positive LNs, pN2b -He received Capox 09/11/21 - 01/15/22, oxali discontinued due to significant nausea/vomiting and headaches. He completed Xeloda in late June 2023.  -on surveillance  -CT 07/2022 showed new hepatic subsegment VIII lesion just along the margin of the middle hepatic vein near the dome of the RIGHT hemiliver, and enlarging RIGHT upper lobe pulmonary nodule suspicious for metastatic disease.  The liver lesion was hypermetabolic on PET scan, but lung nodule was not  -liver biopsy was attempted but not feasible  - GuardantReveal was positive for circulating tumor DNA, consistent with recurrence -He requested second opinion at [facility].  Neoadjuvant chemo recommended before surgical resection, he agreed.  Port placed -plan to repeat imaging after 3 months of chemo -NGS Tempus showed KRAS/NRAS/BRAF wild type, PIK3CA +. We discussed even though he is a candidate for EGFR inhibitor, he would not benefit given that  his primary tumor was right side colon cancer. Continue FOLFIRI. -Mr. Salzberg appears stable.  S/p 3 cycles FOLFIRI tolerating moderately well with cramping diarrhea, nausea, headache, and mucositis.  Side effects were partially managed with supportive care at home.  I refilled Magic mouthwash and Compazine, he will start Decadron on day 2, and I prescribed Lomotil to alternate with Imodium. -Able to recover and function well on his week off treatment, with adequate performance status, and no clinical evidence of progression -Labs reviewed, adequate for cycle 4 FOLFIRI today as planned.  We agreed to proceed with the same dose, but if he has recurrent/worsening side effects we will dose reduced with cycle 5 -Follow-up in 2 weeks with cycle 5 -Return to [facility] in May for restaging and surgical follow-up     PLAN: -Labs reviewed -Proceed with cycle 4 FOLFIRI as planned, same dose -Reviewed symptom management:  -nausea: begin decadron 4 mg daily x3-5 days, starting on day 2; alternate compazine and phenergan PRN (do not take together); add zofran PRN on day 3  -cramping diarrhea: alternate imodium and lomotil PRN  -mucositis: refilled magic mouthwash -F/up and cycle 5 in 2 weeks    All questions were answered. The patient knows to call the clinic with any problems, questions or concerns. No barriers to learning were detected. I spent 20 minutes counseling the patient face to face. The total time spent in the appointment was 30 minutes and more than 50% was on counseling, review of test results, and coordination of care.   Cira Rue, NP-C 12/02/2022

## 2022-12-02 ENCOUNTER — Inpatient Hospital Stay: Payer: Medicaid Other | Attending: Nurse Practitioner | Admitting: Nurse Practitioner

## 2022-12-02 ENCOUNTER — Other Ambulatory Visit: Payer: Self-pay

## 2022-12-02 ENCOUNTER — Inpatient Hospital Stay: Payer: Medicaid Other

## 2022-12-02 ENCOUNTER — Inpatient Hospital Stay: Payer: Medicaid Other | Admitting: Dietician

## 2022-12-02 ENCOUNTER — Encounter: Payer: Self-pay | Admitting: Nurse Practitioner

## 2022-12-02 DIAGNOSIS — R51 Headache with orthostatic component, not elsewhere classified: Secondary | ICD-10-CM | POA: Insufficient documentation

## 2022-12-02 DIAGNOSIS — G40909 Epilepsy, unspecified, not intractable, without status epilepticus: Secondary | ICD-10-CM | POA: Diagnosis not present

## 2022-12-02 DIAGNOSIS — K219 Gastro-esophageal reflux disease without esophagitis: Secondary | ICD-10-CM | POA: Insufficient documentation

## 2022-12-02 DIAGNOSIS — Z7952 Long term (current) use of systemic steroids: Secondary | ICD-10-CM | POA: Diagnosis not present

## 2022-12-02 DIAGNOSIS — F1010 Alcohol abuse, uncomplicated: Secondary | ICD-10-CM | POA: Insufficient documentation

## 2022-12-02 DIAGNOSIS — K1230 Oral mucositis (ulcerative), unspecified: Secondary | ICD-10-CM | POA: Diagnosis not present

## 2022-12-02 DIAGNOSIS — R197 Diarrhea, unspecified: Secondary | ICD-10-CM | POA: Insufficient documentation

## 2022-12-02 DIAGNOSIS — Z95828 Presence of other vascular implants and grafts: Secondary | ICD-10-CM

## 2022-12-02 DIAGNOSIS — Z5111 Encounter for antineoplastic chemotherapy: Secondary | ICD-10-CM | POA: Insufficient documentation

## 2022-12-02 DIAGNOSIS — D500 Iron deficiency anemia secondary to blood loss (chronic): Secondary | ICD-10-CM

## 2022-12-02 DIAGNOSIS — C182 Malignant neoplasm of ascending colon: Secondary | ICD-10-CM

## 2022-12-02 DIAGNOSIS — Z79899 Other long term (current) drug therapy: Secondary | ICD-10-CM | POA: Insufficient documentation

## 2022-12-02 DIAGNOSIS — C787 Secondary malignant neoplasm of liver and intrahepatic bile duct: Secondary | ICD-10-CM | POA: Diagnosis not present

## 2022-12-02 DIAGNOSIS — K6389 Other specified diseases of intestine: Secondary | ICD-10-CM | POA: Diagnosis not present

## 2022-12-02 LAB — CMP (CANCER CENTER ONLY)
ALT: 18 U/L (ref 0–44)
AST: 15 U/L (ref 15–41)
Albumin: 4.3 g/dL (ref 3.5–5.0)
Alkaline Phosphatase: 99 U/L (ref 38–126)
Anion gap: 8 (ref 5–15)
BUN: 9 mg/dL (ref 6–20)
CO2: 27 mmol/L (ref 22–32)
Calcium: 9.4 mg/dL (ref 8.9–10.3)
Chloride: 107 mmol/L (ref 98–111)
Creatinine: 0.82 mg/dL (ref 0.61–1.24)
GFR, Estimated: >60 mL/min (ref >60)
Glucose, Bld: 114 mg/dL — ABNORMAL HIGH (ref 70–99)
Potassium: 3.8 mmol/L (ref 3.5–5.1)
Sodium: 142 mmol/L (ref 135–145)
Total Bilirubin: 0.3 mg/dL (ref 0.3–1.2)
Total Protein: 7.8 g/dL (ref 6.5–8.1)

## 2022-12-02 LAB — CBC WITH DIFFERENTIAL (CANCER CENTER ONLY)
Abs Immature Granulocytes: 0.02 10*3/uL (ref 0.00–0.07)
Basophils Absolute: 0 10*3/uL (ref 0.0–0.1)
Basophils Relative: 1 %
Eosinophils Absolute: 0.2 10*3/uL (ref 0.0–0.5)
Eosinophils Relative: 3 %
HCT: 34.6 % — ABNORMAL LOW (ref 39.0–52.0)
Hemoglobin: 11.8 g/dL — ABNORMAL LOW (ref 13.0–17.0)
Immature Granulocytes: 0 %
Lymphocytes Relative: 37 %
Lymphs Abs: 1.9 10*3/uL (ref 0.7–4.0)
MCH: 28.9 pg (ref 26.0–34.0)
MCHC: 34.1 g/dL (ref 30.0–36.0)
MCV: 84.8 fL (ref 80.0–100.0)
Monocytes Absolute: 0.4 10*3/uL (ref 0.1–1.0)
Monocytes Relative: 7 %
Neutro Abs: 2.6 10*3/uL (ref 1.7–7.7)
Neutrophils Relative %: 52 %
Platelet Count: 245 10*3/uL (ref 150–400)
RBC: 4.08 MIL/uL — ABNORMAL LOW (ref 4.22–5.81)
RDW: 13.8 % (ref 11.5–15.5)
WBC Count: 5.1 10*3/uL (ref 4.0–10.5)
nRBC: 0 % (ref 0.0–0.2)

## 2022-12-02 MED ORDER — SODIUM CHLORIDE 0.9 % IV SOLN: Freq: Once | INTRAVENOUS | Status: AC

## 2022-12-02 MED ORDER — SODIUM CHLORIDE 0.9 % IV SOLN
2400.0000 mg/m2 | INTRAVENOUS | Status: DC
  Administered 2022-12-02: 5000 mg via INTRAVENOUS
  Filled 2022-12-02: qty 100

## 2022-12-02 MED ORDER — NYSTATIN 100000 UNIT/ML MT SUSP
5.0000 mL | Freq: Three times a day (TID) | ORAL | 1 refills | Status: DC | PRN
Start: 2022-12-02 — End: 2023-12-03
  Filled 2022-12-02: qty 140, 10d supply, fill #0

## 2022-12-02 MED ORDER — SODIUM CHLORIDE 0.9 % IV SOLN
10.0000 mg | Freq: Once | INTRAVENOUS | Status: AC
  Administered 2022-12-02: 10 mg via INTRAVENOUS
  Filled 2022-12-02: qty 10

## 2022-12-02 MED ORDER — PROCHLORPERAZINE MALEATE 10 MG PO TABS
10.0000 mg | ORAL_TABLET | Freq: Four times a day (QID) | ORAL | 1 refills | Status: DC | PRN
Start: 2022-12-02 — End: 2023-01-02

## 2022-12-02 MED ORDER — DEXAMETHASONE 4 MG PO TABS
4.0000 mg | ORAL_TABLET | Freq: Every day | ORAL | 1 refills | Status: DC
Start: 2022-12-02 — End: 2023-08-17

## 2022-12-02 MED ORDER — SODIUM CHLORIDE 0.9 % IV SOLN
150.0000 mg | Freq: Once | INTRAVENOUS | Status: AC
  Administered 2022-12-02: 150 mg via INTRAVENOUS
  Filled 2022-12-02: qty 150

## 2022-12-02 MED ORDER — SODIUM CHLORIDE 0.9% FLUSH
10.0000 mL | Freq: Once | INTRAVENOUS | Status: AC
  Administered 2022-12-02: 10 mL

## 2022-12-02 MED ORDER — SODIUM CHLORIDE 0.9 % IV SOLN
180.0000 mg/m2 | Freq: Once | INTRAVENOUS | Status: AC
  Administered 2022-12-02: 400 mg via INTRAVENOUS
  Filled 2022-12-02: qty 20

## 2022-12-02 MED ORDER — SODIUM CHLORIDE 0.9 % IV SOLN
400.0000 mg/m2 | Freq: Once | INTRAVENOUS | Status: AC
  Administered 2022-12-02: 804 mg via INTRAVENOUS
  Filled 2022-12-02: qty 25

## 2022-12-02 MED ORDER — PALONOSETRON HCL INJECTION 0.25 MG/5ML
0.2500 mg | Freq: Once | INTRAVENOUS | Status: AC
  Administered 2022-12-02: 0.25 mg via INTRAVENOUS
  Filled 2022-12-02: qty 5

## 2022-12-02 MED ORDER — HEPARIN SOD (PORK) LOCK FLUSH 100 UNIT/ML IV SOLN: 500.0000 [IU] | Freq: Once | INTRAVENOUS | Status: DC | PRN

## 2022-12-02 MED ORDER — ATROPINE SULFATE 1 MG/ML IV SOLN
0.5000 mg | Freq: Once | INTRAVENOUS | Status: AC | PRN
  Administered 2022-12-02: 0.5 mg via INTRAVENOUS
  Filled 2022-12-02: qty 1

## 2022-12-02 MED ORDER — LIDOCAINE VISCOUS HCL 2 % MT SOLN
5.0000 mL | Freq: Three times a day (TID) | OROMUCOSAL | 1 refills | Status: DC | PRN
Start: 2022-12-02 — End: 2022-12-02

## 2022-12-02 MED ORDER — FLUOROURACIL CHEMO INJECTION 2.5 GM/50ML
400.0000 mg/m2 | Freq: Once | INTRAVENOUS | Status: AC
  Administered 2022-12-02: 800 mg via INTRAVENOUS
  Filled 2022-12-02: qty 16

## 2022-12-02 MED ORDER — SODIUM CHLORIDE 0.9% FLUSH: 10.0000 mL | INTRAVENOUS | Status: DC | PRN

## 2022-12-02 MED ORDER — DIPHENOXYLATE-ATROPINE 2.5-0.025 MG PO TABS
1.0000 | ORAL_TABLET | Freq: Four times a day (QID) | ORAL | 0 refills | Status: DC | PRN
Start: 2022-12-02 — End: 2024-01-29

## 2022-12-02 NOTE — Progress Notes (Signed)
Nutrition Assessment   Reason for Assessment: MD referral    ASSESSMENT: 47 year old male with stage III cancer of right colon (diagnosed 07/2021). Now with recurrence in liver. He is currently receiving neoadjuvant Folfiri q14d (start 2/119. Patient is under the care of Dr. Burr Medico.  Past medical history includes C diff, enteritis, cholelithiasis, IDA, PCM, GERD, alcoholic ketoacidosis  Met with patient in infusion. He reports ongoing nausea. Patient is taking antiemetics. These help sometimes. Noted patient is to start dexamethasone for nausea and fatigue. He is hoping this works better. Patient reports decreased appetite. He typically will not eat anything until dinner time. Recalls lamb chops, rice, salad, and piece of chocolate cake for dinner. Patient reports drinking very little water. States he has been drinking a lot of sweet tea lately. He also likes Gatorade. Patient reports mucositis resolved with magic mouthwash. Patient did not find baking soda/salt water rinses helped with this.    Nutrition Focused Physical Exam: deferred    Medications: magic mouthwash, compazine, gabapentin, phenergan, zofran, lomotil   Labs: reviewed    Anthropometrics:   Height: 5'9" Weight: 186 lb 12.8 oz  UBW: 192 lb (Dec 2023) BMI: 27.59   NUTRITION DIAGNOSIS: Suboptimal intake related to recurrent colon cancer and side effects of therapy as evidenced by dietary recall   INTERVENTION:  Educated on importance of adequate energy intake to maintain strength/weights during treatment  Encouraged small frequent meals and snacks vs one larger meal in the evening Discussed importance of protein to support post-op healing, foods with protein and encouraged protein source with meals and snacks - handouts with ideas provided Discussed strategies for nausea and diarrhea, foods best tolerated and foods to avoid - handouts with tips + list of foods provided Patient will work to increase intake of water   Suggested Ensure Plus/equivalent daily for added calories and protein. Pt used to drink these and liked them. He is agreeable to this One complimentary case Ensure Plus HP provided today  Educated on importance of oral care Patient goals written out for him per his request Contact information given    MONITORING, EVALUATION, GOAL: Patient will tolerate increased calories and protein to minimize wt loss during treatment    Next Visit: Monday April 29 during infusion

## 2022-12-02 NOTE — Patient Instructions (Signed)
[place]  Discharge Instructions: Thank you for choosing [facility] to provide your oncology and hematology care.   If you have a lab appointment with the [facility], please go directly to the [facility] and check in at the registration area.   Wear comfortable clothing and clothing appropriate for easy access to any Portacath or PICC line.   We strive to give you quality time with your provider. You may need to reschedule your appointment if you arrive late (15 or more minutes).  Arriving late affects you and other patients whose appointments are after yours.  Also, if you miss three or more appointments without notifying the office, you may be dismissed from the clinic at the provider's discretion.      For prescription refill requests, have your pharmacy contact our office and allow 72 hours for refills to be completed.    Today you received the following chemotherapy and/or immunotherapy agents Campostar, Leucovorin, 5FU.      To help prevent nausea and vomiting after your treatment, we encourage you to take your nausea medication as directed.  BELOW ARE SYMPTOMS THAT SHOULD BE REPORTED IMMEDIATELY: *FEVER GREATER THAN 100.4 F (38 C) OR HIGHER *CHILLS OR SWEATING *NAUSEA AND VOMITING THAT IS NOT CONTROLLED WITH YOUR NAUSEA MEDICATION *UNUSUAL SHORTNESS OF BREATH *UNUSUAL BRUISING OR BLEEDING *URINARY PROBLEMS (pain or burning when urinating, or frequent urination) *BOWEL PROBLEMS (unusual diarrhea, constipation, pain near the anus) TENDERNESS IN MOUTH AND THROAT WITH OR WITHOUT PRESENCE OF ULCERS (sore throat, sores in mouth, or a toothache) UNUSUAL RASH, SWELLING OR PAIN  UNUSUAL VAGINAL DISCHARGE OR ITCHING   Items with * indicate a potential emergency and should be followed up as soon as possible or go to the Emergency Department if any problems should occur.  Please show the CHEMOTHERAPY ALERT CARD or IMMUNOTHERAPY  ALERT CARD at check-in to the Emergency Department and triage nurse.  Should you have questions after your visit or need to cancel or reschedule your appointment, please contact [facility]  Dept: [phone]  and follow the prompts.  Office hours are 8:00 a.m. to 4:30 p.m. Monday - Friday. Please note that voicemails left after 4:00 p.m. may not be returned until the following business day.  We are closed weekends and major holidays. You have access to a nurse at all times for urgent questions. Please call the main number to the clinic Dept: [phone] and follow the prompts.   For any non-urgent questions, you may also contact your provider using MyChart. We now offer e-Visits for anyone 68 and older to request care online for non-urgent symptoms. For details visit [email or web address].   Also download the MyChart app! Go to the app store, search "MyChart", open the app, select [place], and log in with your MyChart username and password.

## 2022-12-03 ENCOUNTER — Other Ambulatory Visit (HOSPITAL_COMMUNITY): Payer: Self-pay

## 2022-12-04 ENCOUNTER — Other Ambulatory Visit: Payer: Self-pay

## 2022-12-04 ENCOUNTER — Inpatient Hospital Stay: Payer: Medicaid Other

## 2022-12-04 VITALS — BP 137/92 | HR 76 | Temp 98.6°F | Resp 18

## 2022-12-04 DIAGNOSIS — C787 Secondary malignant neoplasm of liver and intrahepatic bile duct: Secondary | ICD-10-CM | POA: Diagnosis not present

## 2022-12-04 DIAGNOSIS — F1010 Alcohol abuse, uncomplicated: Secondary | ICD-10-CM | POA: Diagnosis not present

## 2022-12-04 DIAGNOSIS — K219 Gastro-esophageal reflux disease without esophagitis: Secondary | ICD-10-CM | POA: Diagnosis not present

## 2022-12-04 DIAGNOSIS — K6389 Other specified diseases of intestine: Secondary | ICD-10-CM | POA: Diagnosis not present

## 2022-12-04 DIAGNOSIS — Z79899 Other long term (current) drug therapy: Secondary | ICD-10-CM | POA: Diagnosis not present

## 2022-12-04 DIAGNOSIS — R197 Diarrhea, unspecified: Secondary | ICD-10-CM | POA: Diagnosis not present

## 2022-12-04 DIAGNOSIS — Z5111 Encounter for antineoplastic chemotherapy: Secondary | ICD-10-CM | POA: Diagnosis not present

## 2022-12-04 DIAGNOSIS — C182 Malignant neoplasm of ascending colon: Secondary | ICD-10-CM | POA: Diagnosis not present

## 2022-12-04 DIAGNOSIS — G40909 Epilepsy, unspecified, not intractable, without status epilepticus: Secondary | ICD-10-CM | POA: Diagnosis not present

## 2022-12-04 DIAGNOSIS — K1230 Oral mucositis (ulcerative), unspecified: Secondary | ICD-10-CM | POA: Diagnosis not present

## 2022-12-04 DIAGNOSIS — Z7952 Long term (current) use of systemic steroids: Secondary | ICD-10-CM | POA: Diagnosis not present

## 2022-12-04 DIAGNOSIS — R51 Headache with orthostatic component, not elsewhere classified: Secondary | ICD-10-CM | POA: Diagnosis not present

## 2022-12-04 MED ORDER — HEPARIN SOD (PORK) LOCK FLUSH 100 UNIT/ML IV SOLN
500.0000 [IU] | Freq: Once | INTRAVENOUS | Status: AC | PRN
  Administered 2022-12-04: 500 [IU]

## 2022-12-04 MED ORDER — SODIUM CHLORIDE 0.9% FLUSH
10.0000 mL | INTRAVENOUS | Status: DC | PRN
  Administered 2022-12-04: 10 mL

## 2022-12-10 ENCOUNTER — Other Ambulatory Visit (HOSPITAL_COMMUNITY): Payer: Self-pay

## 2022-12-11 ENCOUNTER — Other Ambulatory Visit (HOSPITAL_COMMUNITY): Payer: Self-pay

## 2022-12-12 MED FILL — Dexamethasone Sodium Phosphate Inj 100 MG/10ML: INTRAMUSCULAR | Qty: 1 | Status: AC

## 2022-12-12 MED FILL — Fosaprepitant Dimeglumine For IV Infusion 150 MG (Base Eq): INTRAVENOUS | Qty: 5 | Status: AC

## 2022-12-15 ENCOUNTER — Encounter: Payer: Self-pay | Admitting: Hematology

## 2022-12-15 ENCOUNTER — Inpatient Hospital Stay: Payer: Medicaid Other

## 2022-12-15 ENCOUNTER — Other Ambulatory Visit: Payer: Self-pay

## 2022-12-15 ENCOUNTER — Inpatient Hospital Stay (HOSPITAL_BASED_OUTPATIENT_CLINIC_OR_DEPARTMENT_OTHER): Payer: Medicaid Other | Admitting: Hematology

## 2022-12-15 VITALS — BP 144/96 | HR 98 | Temp 98.3°F | Resp 16 | Wt 185.3 lb

## 2022-12-15 VITALS — BP 154/97 | HR 86

## 2022-12-15 DIAGNOSIS — K6389 Other specified diseases of intestine: Secondary | ICD-10-CM | POA: Diagnosis not present

## 2022-12-15 DIAGNOSIS — C182 Malignant neoplasm of ascending colon: Secondary | ICD-10-CM

## 2022-12-15 DIAGNOSIS — G40909 Epilepsy, unspecified, not intractable, without status epilepticus: Secondary | ICD-10-CM | POA: Diagnosis not present

## 2022-12-15 DIAGNOSIS — R197 Diarrhea, unspecified: Secondary | ICD-10-CM | POA: Diagnosis not present

## 2022-12-15 DIAGNOSIS — R51 Headache with orthostatic component, not elsewhere classified: Secondary | ICD-10-CM | POA: Diagnosis not present

## 2022-12-15 DIAGNOSIS — C787 Secondary malignant neoplasm of liver and intrahepatic bile duct: Secondary | ICD-10-CM | POA: Diagnosis not present

## 2022-12-15 DIAGNOSIS — Z5111 Encounter for antineoplastic chemotherapy: Secondary | ICD-10-CM | POA: Diagnosis not present

## 2022-12-15 DIAGNOSIS — Z95828 Presence of other vascular implants and grafts: Secondary | ICD-10-CM

## 2022-12-15 DIAGNOSIS — K1230 Oral mucositis (ulcerative), unspecified: Secondary | ICD-10-CM | POA: Diagnosis not present

## 2022-12-15 DIAGNOSIS — F1010 Alcohol abuse, uncomplicated: Secondary | ICD-10-CM | POA: Diagnosis not present

## 2022-12-15 DIAGNOSIS — Z79899 Other long term (current) drug therapy: Secondary | ICD-10-CM | POA: Diagnosis not present

## 2022-12-15 DIAGNOSIS — K219 Gastro-esophageal reflux disease without esophagitis: Secondary | ICD-10-CM | POA: Diagnosis not present

## 2022-12-15 DIAGNOSIS — Z7952 Long term (current) use of systemic steroids: Secondary | ICD-10-CM | POA: Diagnosis not present

## 2022-12-15 DIAGNOSIS — D500 Iron deficiency anemia secondary to blood loss (chronic): Secondary | ICD-10-CM

## 2022-12-15 LAB — CBC WITH DIFFERENTIAL (CANCER CENTER ONLY)
Abs Immature Granulocytes: 0.03 10*3/uL (ref 0.00–0.07)
Basophils Absolute: 0 10*3/uL (ref 0.0–0.1)
Basophils Relative: 1 %
Eosinophils Absolute: 0.2 10*3/uL (ref 0.0–0.5)
Eosinophils Relative: 4 %
HCT: 35.9 % — ABNORMAL LOW (ref 39.0–52.0)
Hemoglobin: 11.9 g/dL — ABNORMAL LOW (ref 13.0–17.0)
Immature Granulocytes: 1 %
Lymphocytes Relative: 42 %
Lymphs Abs: 2.4 10*3/uL (ref 0.7–4.0)
MCH: 28.4 pg (ref 26.0–34.0)
MCHC: 33.1 g/dL (ref 30.0–36.0)
MCV: 85.7 fL (ref 80.0–100.0)
Monocytes Absolute: 0.5 10*3/uL (ref 0.1–1.0)
Monocytes Relative: 9 %
Neutro Abs: 2.3 10*3/uL (ref 1.7–7.7)
Neutrophils Relative %: 43 %
Platelet Count: 257 10*3/uL (ref 150–400)
RBC: 4.19 MIL/uL — ABNORMAL LOW (ref 4.22–5.81)
RDW: 14 % (ref 11.5–15.5)
WBC Count: 5.5 10*3/uL (ref 4.0–10.5)
nRBC: 0 % (ref 0.0–0.2)

## 2022-12-15 LAB — CMP (CANCER CENTER ONLY)
ALT: 55 U/L — ABNORMAL HIGH (ref 0–44)
AST: 20 U/L (ref 15–41)
Albumin: 4.4 g/dL (ref 3.5–5.0)
Alkaline Phosphatase: 112 U/L (ref 38–126)
Anion gap: 9 (ref 5–15)
BUN: 15 mg/dL (ref 6–20)
CO2: 27 mmol/L (ref 22–32)
Calcium: 9.7 mg/dL (ref 8.9–10.3)
Chloride: 105 mmol/L (ref 98–111)
Creatinine: 0.92 mg/dL (ref 0.61–1.24)
GFR, Estimated: >60 mL/min (ref >60)
Glucose, Bld: 106 mg/dL — ABNORMAL HIGH (ref 70–99)
Potassium: 3.5 mmol/L (ref 3.5–5.1)
Sodium: 141 mmol/L (ref 135–145)
Total Bilirubin: 0.3 mg/dL (ref 0.3–1.2)
Total Protein: 8.1 g/dL (ref 6.5–8.1)

## 2022-12-15 MED ORDER — SODIUM CHLORIDE 0.9 % IV SOLN
150.0000 mg | Freq: Once | INTRAVENOUS | Status: AC
  Administered 2022-12-15: 150 mg via INTRAVENOUS
  Filled 2022-12-15: qty 150

## 2022-12-15 MED ORDER — PALONOSETRON HCL INJECTION 0.25 MG/5ML
0.2500 mg | Freq: Once | INTRAVENOUS | Status: AC
  Administered 2022-12-15: 0.25 mg via INTRAVENOUS
  Filled 2022-12-15: qty 5

## 2022-12-15 MED ORDER — SODIUM CHLORIDE 0.9% FLUSH: 10.0000 mL | INTRAVENOUS | Status: DC | PRN

## 2022-12-15 MED ORDER — FLUOROURACIL CHEMO INJECTION 2.5 GM/50ML
400.0000 mg/m2 | Freq: Once | INTRAVENOUS | Status: AC
  Administered 2022-12-15: 800 mg via INTRAVENOUS
  Filled 2022-12-15: qty 16

## 2022-12-15 MED ORDER — HEPARIN SOD (PORK) LOCK FLUSH 100 UNIT/ML IV SOLN: 500.0000 [IU] | Freq: Once | INTRAVENOUS | Status: DC | PRN

## 2022-12-15 MED ORDER — SODIUM CHLORIDE 0.9 % IV SOLN
400.0000 mg/m2 | Freq: Once | INTRAVENOUS | Status: AC
  Administered 2022-12-15: 804 mg via INTRAVENOUS
  Filled 2022-12-15: qty 17.5

## 2022-12-15 MED ORDER — ONDANSETRON HCL 8 MG PO TABS
8.0000 mg | ORAL_TABLET | Freq: Three times a day (TID) | ORAL | 1 refills | Status: DC | PRN
Start: 2022-12-15 — End: 2023-01-02

## 2022-12-15 MED ORDER — SODIUM CHLORIDE 0.9 % IV SOLN
10.0000 mg | Freq: Once | INTRAVENOUS | Status: AC
  Administered 2022-12-15: 10 mg via INTRAVENOUS
  Filled 2022-12-15: qty 10

## 2022-12-15 MED ORDER — SODIUM CHLORIDE 0.9 % IV SOLN
2400.0000 mg/m2 | INTRAVENOUS | Status: DC
  Administered 2022-12-15: 5000 mg via INTRAVENOUS
  Filled 2022-12-15: qty 100

## 2022-12-15 MED ORDER — SODIUM CHLORIDE 0.9% FLUSH
10.0000 mL | Freq: Once | INTRAVENOUS | Status: AC
  Administered 2022-12-15: 10 mL

## 2022-12-15 MED ORDER — SODIUM CHLORIDE 0.9 % IV SOLN: Freq: Once | INTRAVENOUS | Status: AC

## 2022-12-15 MED ORDER — SODIUM CHLORIDE 0.9 % IV SOLN
180.0000 mg/m2 | Freq: Once | INTRAVENOUS | Status: AC
  Administered 2022-12-15: 400 mg via INTRAVENOUS
  Filled 2022-12-15: qty 20

## 2022-12-15 MED ORDER — ATROPINE SULFATE 1 MG/ML IV SOLN
0.5000 mg | Freq: Once | INTRAVENOUS | Status: AC | PRN
  Administered 2022-12-15: 0.5 mg via INTRAVENOUS
  Filled 2022-12-15: qty 1

## 2022-12-15 NOTE — Assessment & Plan Note (Signed)
Stage IIIC (pT4a, pN2b, cM0), MSS -diagnosed in 07/2021 -s/p partial colectomy by Dr. Derrell Lolling 07/23/21, final path showed: pT4a lesion with LVI and PNI, no perforation; clear margins; 9/19 positive LNs, pN2b --He received Capox 09/11/21 - 01/15/22, oxali discontinued due to significant nausea/vomiting and headaches. He completed Xeloda in late June 2023.  -He unfortunately developed oligo liver metastasis based on PET/CT in 08/2022, liver biopsy was attempted but did not feasible.  -Guardian review was positive for circulating tumor DNA, consistent with recurrence. -Patient requested second opinion of surgery, was seen at [facility].  Neoadjuvant chemotherapy recommended before surgical resection, and he agreed. -He started chemotherapy FOLFIRI on 10/20/2022, no bevacizumab due to pending surgery  -NGS Tempus showed no targetable therapy, KRAS/NRAS/BRAF wild type, would not recommend EGFR inhibitor for first line due to his primary right side colon cancer.

## 2022-12-15 NOTE — Patient Instructions (Signed)
[facility]  Discharge Instructions: Thank you for choosing [facility] to provide your oncology and hematology care.   If you have a lab appointment with the Cancer Center, please go directly to the Cancer Center and check in at the registration area.   Wear comfortable clothing and clothing appropriate for easy access to any Portacath or PICC line.   We strive to give you quality time with your provider. You may need to reschedule your appointment if you arrive late (15 or more minutes).  Arriving late affects you and other patients whose appointments are after yours.  Also, if you miss three or more appointments without notifying the office, you may be dismissed from the clinic at the provider's discretion.      For prescription refill requests, have your pharmacy contact our office and allow 72 hours for refills to be completed.    Today you received the following chemotherapy and/or immunotherapy agents: Irinotecan, Leucovorin, 5FU      To help prevent nausea and vomiting after your treatment, we encourage you to take your nausea medication as directed.  BELOW ARE SYMPTOMS THAT SHOULD BE REPORTED IMMEDIATELY: *FEVER GREATER THAN 100.4 F (38 C) OR HIGHER *CHILLS OR SWEATING *NAUSEA AND VOMITING THAT IS NOT CONTROLLED WITH YOUR NAUSEA MEDICATION *UNUSUAL SHORTNESS OF BREATH *UNUSUAL BRUISING OR BLEEDING *URINARY PROBLEMS (pain or burning when urinating, or frequent urination) *BOWEL PROBLEMS (unusual diarrhea, constipation, pain near the anus) TENDERNESS IN MOUTH AND THROAT WITH OR WITHOUT PRESENCE OF ULCERS (sore throat, sores in mouth, or a toothache) UNUSUAL RASH, SWELLING OR PAIN  UNUSUAL VAGINAL DISCHARGE OR ITCHING   Items with * indicate a potential emergency and should be followed up as soon as possible or go to the Emergency Department if any problems should occur.  Please show the CHEMOTHERAPY ALERT CARD or IMMUNOTHERAPY  ALERT CARD at check-in to the Emergency Department and triage nurse.  Should you have questions after your visit or need to cancel or reschedule your appointment, please contact [place] [facility]  Dept: [phone]  and follow the prompts.  Office hours are 8:00 a.m. to 4:30 p.m. Monday - Friday. Please note that voicemails left after 4:00 p.m. may not be returned until the following business day.  We are closed weekends and major holidays. You have access to a nurse at all times for urgent questions. Please call the main number to the clinic Dept: [phone] and follow the prompts.   For any non-urgent questions, you may also contact your provider using MyChart. We now offer e-Visits for anyone 33 and older to request care online for non-urgent symptoms. For details visit [email or web address].   Also download the MyChart app! Go to the app store, search "MyChart", open the app, select [place], and log in with your MyChart username and password.

## 2022-12-15 NOTE — Progress Notes (Signed)
[facility]   [phone] [phone]   Clinic Follow up Note   Patient Care Team: Frederic Jericho, [name] as PCP - General (Physician Assistant) Axel Filler, MD as Consulting Physician (General Surgery) Pollyann Samples, NP as Nurse Practitioner (Nurse Practitioner) Malachy Mood, MD as Consulting Physician (Hematology) Ewing Schlein, MD as Referring Physician (Pain Medicine)  Date of Service:  12/15/2022  CHIEF COMPLAINT: f/u of ecurrent colon cancer    CURRENT THERAPY: Neoadjuvant FOLFIRI q14 days, starting 10/20/22    ASSESSMENT: Joseph Haney is a 47 y.o. 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 Derrell Lolling 07/23/21, final path showed: pT4a lesion with LVI and PNI, no perforation; clear margins; 9/19 positive LNs, pN2b --He received Capox 09/11/21 - 01/15/22, oxali discontinued due to significant nausea/vomiting and headaches. He completed Xeloda in late June 2023.  -He unfortunately developed oligo liver metastasis based on PET/CT in 08/2022, liver biopsy was attempted but did not feasible.  -Guardian review was positive for circulating tumor DNA, consistent with recurrence. -Patient requested second opinion of surgery, was seen at [facility].  Neoadjuvant chemotherapy recommended before surgical resection, and he agreed. -He started chemotherapy FOLFIRI on 10/20/2022, no bevacizumab due to pending surgery  -NGS [facility] showed no targetable therapy, KRAS/NRAS/BRAF wild type, would not recommend EGFR inhibitor for first line due to his primary right side colon cancer.     PLAN: -lab pending -repeat scan after C6 -proceed with C5 FOLFIRI as planned  -I refill  Zofran -lab/flush and treatment 4/29   SUMMARY OF ONCOLOGIC HISTORY: Oncology History Overview Note   Cancer Staging  Cancer of right colon [facility]) Staging form: Colon and Rectum, AJCC 8th Edition -  Pathologic stage from 07/23/2021: Stage IIIC (pT4a, pN2b, cM0) - Signed by Pollyann Samples, NP on 08/15/2021 Stage prefix: Initial diagnosis Histologic grading system: 4 grade system Histologic grade (G): G2 Laterality: Right Lymph-vascular invasion (LVI): LVI present/identified, NOS Perineural invasion (PNI): Present Microsatellite instability (MSI): Stable     Cancer of right colon  07/19/2021 Imaging   CT AP IMPRESSION: Focal area of high-grade narrowing of the proximal ascending colon may be sequela of chronic inflammation and stricture versus a mass. There is mild distension of the cecum and terminal ileum. Clinical correlation and further evaluation with lower GI study or colonoscopy recommended.   07/22/2021 Procedure   Colonoscopy A severe stenosis was found in the distal ascending colon and was non-traversed. It was ulcerated and inflamed. This could be inflammatory versus malignant stricture.   07/22/2021 Initial Biopsy   FINAL MICROSCOPIC DIAGNOSIS:  A. COLON, ASCENDING, BIOPSY:  - Invasive colorectal adenocarcinoma, moderately differentiated.    07/23/2021 Definitive Surgery   PRE-OPERATIVE DIAGNOSIS:  right colon mass POST-OPERATIVE DIAGNOSIS:  right colon mass PROCEDURE:  Procedure(s): XI ROBOT ASSISTED LAPAROSCOPIC RIGHT COLECTOMY (N/A) Dr. Derrell Lolling   07/23/2021 Pathology Results   FINAL MICROSCOPIC DIAGNOSIS:  A. COLON, RIGHT, RESECTION:  - Invasive moderately differentiated colonic adenocarcinoma, 4.5 cm.  - Carcinoma extends into pericolonic connective tissue and focally  involves serosal surface.  - Margins not involved.  - Metastatic carcinoma in nine of nineteen lymph nodes (9/19).  - Unremarkable appendix. MMR normal, MSI-stable    07/23/2021 Cancer Staging   Staging form: Colon and Rectum, AJCC 8th Edition - Pathologic stage from 07/23/2021: Stage IIIC (pT4a, pN2b, cM0) - Signed by Pollyann Samples, NP on 08/15/2021 Stage prefix: Initial  diagnosis Histologic grading system: 4 grade system Histologic grade (G): G2 Laterality: Right Lymph-vascular invasion (LVI): LVI present/identified, NOS Perineural invasion (PNI): Present Microsatellite instability (MSI): Stable   07/24/2021 Initial Diagnosis   Colon adenocarcinoma (HCC)   07/25/2021 Tumor Marker   CEA: 2.9   07/28/2021 Imaging   CT chest IMPRESSION: 1. No evidence of metastatic disease within the chest. 2. Lungs demonstrate atelectasis, most evident lower lobes and bases of the left upper lobe lingula and right middle lobe. No convincing pneumonia and no evidence of pulmonary edema.     09/11/2021 - 01/15/2022 Chemotherapy   Patient is on Treatment Plan : COLORECTAL Xelox (Capeox) q21d     08/2022 Progression   PET scan showed hypermetabolic oligo liver metastasis, biopsy attempted but liver lesion was not seen by [place]. GuardantReveal was also positive for circulating tumor DNA    09/02/2022 - 09/02/2022 Chemotherapy   Patient is on Treatment Plan : COLORECTAL FOLFIRI + Bevacizumab q14d      Genetic Testing   Negative genetic testing. No pathogenic variants identified on the [facility] Multi-Cancer+RNA panel. VUS in APC called [id number] identified. The report date is 09/15/2022.  The Multi-Cancer + RNA Panel offered by [facility] includes sequencing and/or deletion/duplication analysis of the following 70 genes:  AIP*, ALK, APC*, ATM*, AXIN2*, BAP1*, BARD1*, BLM*, BMPR1A*, BRCA1*, BRCA2*, BRIP1*, CDC73*, CDH1*, CDK4, CDKN1B*, CDKN2A, CHEK2*, CTNNA1*, DICER1*, EPCAM, EGFR, FH*, FLCN*, GREM1, HOXB13, KIT, LZTR1, MAX*, MBD4, MEN1*, MET, MITF, MLH1*, MSH2*, MSH3*, MSH6*, MUTYH*, NF1*, NF2*, NTHL1*, PALB2*, PDGFRA, PMS2*, POLD1*, POLE*, POT1*, PRKAR1A*, PTCH1*, PTEN*, RAD51C*, RAD51D*, RB1*, RET, SDHA*, SDHAF2*, SDHB*, SDHC*, SDHD*, SMAD4*, SMARCA4*, SMARCB1*, SMARCE1*, STK11*, SUFU*, TMEM127*, TP53*, TSC1*, TSC2*, VHL*. RNA analysis is performed for * genes.   10/20/2022 -   Chemotherapy   Patient is on Treatment Plan : COLORECTAL FOLFIRI q14d        INTERVAL HISTORY:  Joseph Haney is here for a follow up of ecurrent colon cancer  . He was last seen by NP Lacie on 12/02/2022 He presents to the clinic alone. Pt state he has no problems with last treatment. Pt state the Decadron and Compazine seems to help with the Nausea.   All other systems were reviewed with the patient and are negative.  MEDICAL HISTORY:  Past Medical History:  Diagnosis Date   Alcohol abuse    Cancer    GERD (gastroesophageal reflux disease)    Seizure     SURGICAL HISTORY: Past Surgical History:  Procedure Laterality Date   BIOPSY  07/22/2021   Procedure: BIOPSY;  Surgeon: Kathi Der, MD;  Location: [facility];  Service: Gastroenterology;;   COLON SURGERY     COLONOSCOPY WITH PROPOFOL N/A 07/22/2021   Procedure: COLONOSCOPY WITH PROPOFOL;  Surgeon: Kathi Der, MD;  Location: [facility];  Service: Gastroenterology;  Laterality: N/A;   IR IMAGING GUIDED PORT INSERTION  10/17/2022   NO PAST SURGERIES     SUBMUCOSAL TATTOO INJECTION  07/22/2021   Procedure: SUBMUCOSAL TATTOO INJECTION;  Surgeon: Kathi Der, MD;  Location: [facility];  Service: Gastroenterology;;    I have reviewed the social history and family history with the patient and they are unchanged from previous note.  ALLERGIES:  is allergic to latex and morphine and related.  MEDICATIONS:  Current Outpatient Medications  Medication Sig Dispense Refill   carvedilol (COREG) 25 MG tablet Take 1 tablet (25 mg total) by mouth 2 (two) times daily with a meal. 30 tablet 0   cyclobenzaprine (FLEXERIL) 10 MG tablet  Take 10-20 mg by mouth 2 (two) times daily as needed for muscle spasms.     dexamethasone (DECADRON) 4 MG tablet Take 1 tablet (4 mg total) by mouth daily. Take for 3-5 days after chemo for nausea and fatigue 20 tablet 1   diphenoxylate-atropine (LOMOTIL) 2.5-0.025 MG tablet Take 1  tablet by mouth 4 (four) times daily as needed for diarrhea or loose stools. 30 tablet 0   docusate sodium (COLACE) 100 MG capsule Take 100 mg by mouth daily.     escitalopram (LEXAPRO) 10 MG tablet Take 10 mg by mouth daily.     ferrous sulfate 325 (65 FE) MG tablet Take 1 tablet (325 mg total) by mouth 2 (two) times daily with a meal. 100 tablet 3   gabapentin (NEURONTIN) 400 MG capsule Take 400 mg by mouth every 6 (six) hours as needed.     HYDROcodone-acetaminophen (NORCO/VICODIN) 5-325 MG tablet Take 1 tablet by mouth every 6 (six) hours as needed for moderate pain.     lidocaine-prilocaine (EMLA) cream Apply to affected area once 30 g 3   magic mouthwash (nystatin, lidocaine, diphenhydrAMINE, alum & mag hydroxide) suspension Take 5 mLs by mouth 3 (three) times daily as needed for mouth pain. 140 mL 1   ondansetron (ZOFRAN) 8 MG tablet Take 1 tablet (8 mg total) by mouth every 8 (eight) hours as needed for nausea, vomiting or refractory nausea / vomiting. Start on the third day after chemotherapy. 30 tablet 1   oxyCODONE (ROXICODONE) 5 MG immediate release tablet Take 1 tablet (5 mg total) by mouth every 6 (six) hours as needed for severe pain. 20 tablet 0   polyethylene glycol (MIRALAX / GLYCOLAX) 17 g packet Take 17 g by mouth daily as needed for mild constipation or moderate constipation. 14 each 0   potassium chloride SA (KLOR-CON M) 20 MEQ tablet Take 1 tablet (20 mEq total) by mouth daily for 5 days. 5 tablet 0   prochlorperazine (COMPAZINE) 10 MG tablet Take 1 tablet (10 mg total) by mouth every 6 (six) hours as needed for nausea or vomiting. 30 tablet 1   promethazine (PHENERGAN) 25 MG tablet Take 1 tablet (25 mg total) by mouth every 6 (six) hours as needed for nausea or vomiting. 30 tablet 1   triamcinolone cream (KENALOG) 0.1 % Apply 1 Application topically daily as needed (for rash and itching).     No current facility-administered medications for this visit.    Facility-Administered Medications Ordered in Other Visits  Medication Dose Route Frequency Provider Last Rate Last Admin   atropine injection 0.5 mg  0.5 mg Intravenous Once PRN Malachy Mood, MD       fluorouracil (ADRUCIL) 5,000 mg in sodium chloride 0.9 % 150 mL chemo infusion  2,400 mg/m2 (Treatment Plan Recorded) Intravenous 1 day or 1 dose Malachy Mood, MD       fluorouracil (ADRUCIL) chemo injection 800 mg  400 mg/m2 (Treatment Plan Recorded) Intravenous Once Malachy Mood, MD       heparin lock flush 100 unit/mL  500 Units Intracatheter Once PRN Malachy Mood, MD       irinotecan (CAMPTOSAR) 400 mg in sodium chloride 0.9 % 500 mL chemo infusion  180 mg/m2 (Treatment Plan Recorded) Intravenous Once Malachy Mood, MD       leucovorin 804 mg in sodium chloride 0.9 % 250 mL infusion  400 mg/m2 (Treatment Plan Recorded) Intravenous Once Malachy Mood, MD       sodium chloride flush (NS) 0.9 %  injection 10 mL  10 mL Intracatheter PRN Malachy Mood, MD        PHYSICAL EXAMINATION: ECOG PERFORMANCE STATUS: 1 - Symptomatic but completely ambulatory  Vitals:   12/15/22 1044  BP: (!) 144/96  Pulse: 98  Resp: 16  Temp: 98.3 F (36.8 C)  SpO2: 98%   Wt Readings from Last 3 Encounters:  12/15/22 185 lb 4.8 oz (84.1 kg)  12/02/22 186 lb 12.8 oz (84.7 kg)  11/17/22 184 lb (83.5 kg)     GENERAL:alert, no distress and comfortable SKIN: skin color normal, no rashes or significant lesions EYES: normal, Conjunctiva are pink and non-injected, sclera clear  NEURO: alert & oriented x 3 with fluent speech LABORATORY DATA:  I have reviewed the data as listed    Latest Ref Rng & Units 12/15/2022   10:22 AM 12/02/2022    9:38 AM 11/17/2022    9:29 AM  CBC  WBC 4.0 - 10.5 K/uL 5.5  5.1  5.4   Hemoglobin 13.0 - 17.0 g/dL 16.1  09.6  04.5   Hematocrit 39.0 - 52.0 % 35.9  34.6  35.0   Platelets 150 - 400 K/uL 257  245  275         Latest Ref Rng & Units 12/15/2022   10:22 AM 12/02/2022    9:38 AM 11/17/2022    9:29 AM   CMP  Glucose 70 - 99 mg/dL 409  811  914   BUN 6 - 20 mg/dL Creatinine 0.61 - 1.24 mg/dL 7.82  9.56  2.13   Sodium 135 - 145 mmol/L 141  142  138   Potassium 3.5 - 5.1 mmol/L 3.5  3.8  4.1   Chloride 98 - 111 mmol/L 105  107  102   CO2 22 - 32 mmol/L Calcium 8.9 - 10.3 mg/dL 9.7  9.4  9.3   Total Protein 6.5 - 8.1 g/dL 8.1  7.8  7.9   Total Bilirubin 0.3 - 1.2 mg/dL 0.3  0.3  0.4   Alkaline Phos 38 - 126 U/L 112  99  114   AST 15 - 41 U/L ALT 0 - 44 U/L 55  18  12       RADIOGRAPHIC STUDIES: I have personally reviewed the radiological images as listed and agreed with the findings in the report. No results found.    No orders of the defined types were placed in this encounter.  All questions were answered. The patient knows to call the clinic with any problems, questions or concerns. No barriers to learning was detected. The total time spent in the appointment was 25 minutes.     Malachy Mood, MD 12/15/2022   Carolin Coy, CMA, am acting as scribe for Malachy Mood, MD.   I have reviewed the above documentation for accuracy and completeness, and I agree with the above.

## 2022-12-17 ENCOUNTER — Other Ambulatory Visit: Payer: Self-pay

## 2022-12-17 ENCOUNTER — Inpatient Hospital Stay: Payer: Medicaid Other

## 2022-12-17 VITALS — BP 147/100 | HR 91 | Temp 99.1°F | Resp 100

## 2022-12-17 DIAGNOSIS — Z5111 Encounter for antineoplastic chemotherapy: Secondary | ICD-10-CM | POA: Diagnosis not present

## 2022-12-17 DIAGNOSIS — K219 Gastro-esophageal reflux disease without esophagitis: Secondary | ICD-10-CM | POA: Diagnosis not present

## 2022-12-17 DIAGNOSIS — C182 Malignant neoplasm of ascending colon: Secondary | ICD-10-CM

## 2022-12-17 DIAGNOSIS — Z95828 Presence of other vascular implants and grafts: Secondary | ICD-10-CM

## 2022-12-17 DIAGNOSIS — Z7952 Long term (current) use of systemic steroids: Secondary | ICD-10-CM | POA: Diagnosis not present

## 2022-12-17 DIAGNOSIS — D500 Iron deficiency anemia secondary to blood loss (chronic): Secondary | ICD-10-CM

## 2022-12-17 DIAGNOSIS — K6389 Other specified diseases of intestine: Secondary | ICD-10-CM | POA: Diagnosis not present

## 2022-12-17 DIAGNOSIS — R51 Headache with orthostatic component, not elsewhere classified: Secondary | ICD-10-CM | POA: Diagnosis not present

## 2022-12-17 DIAGNOSIS — F1010 Alcohol abuse, uncomplicated: Secondary | ICD-10-CM | POA: Diagnosis not present

## 2022-12-17 DIAGNOSIS — G40909 Epilepsy, unspecified, not intractable, without status epilepticus: Secondary | ICD-10-CM | POA: Diagnosis not present

## 2022-12-17 DIAGNOSIS — R197 Diarrhea, unspecified: Secondary | ICD-10-CM | POA: Diagnosis not present

## 2022-12-17 DIAGNOSIS — K1230 Oral mucositis (ulcerative), unspecified: Secondary | ICD-10-CM | POA: Diagnosis not present

## 2022-12-17 DIAGNOSIS — C787 Secondary malignant neoplasm of liver and intrahepatic bile duct: Secondary | ICD-10-CM | POA: Diagnosis not present

## 2022-12-17 DIAGNOSIS — Z79899 Other long term (current) drug therapy: Secondary | ICD-10-CM | POA: Diagnosis not present

## 2022-12-17 MED ORDER — HEPARIN SOD (PORK) LOCK FLUSH 100 UNIT/ML IV SOLN
500.0000 [IU] | Freq: Once | INTRAVENOUS | Status: AC
  Administered 2022-12-17: 500 [IU]

## 2022-12-17 MED ORDER — SODIUM CHLORIDE 0.9% FLUSH
10.0000 mL | Freq: Once | INTRAVENOUS | Status: AC
  Administered 2022-12-17: 10 mL

## 2022-12-19 DIAGNOSIS — C182 Malignant neoplasm of ascending colon: Secondary | ICD-10-CM | POA: Diagnosis not present

## 2022-12-26 MED FILL — Dexamethasone Sodium Phosphate Inj 100 MG/10ML: INTRAMUSCULAR | Qty: 1 | Status: AC

## 2022-12-26 MED FILL — Fosaprepitant Dimeglumine For IV Infusion 150 MG (Base Eq): INTRAVENOUS | Qty: 5 | Status: AC

## 2022-12-28 NOTE — Assessment & Plan Note (Signed)
Stage IIIC (pT4a, pN2b, cM0), MSS, KRAS/NRAS/BRAF wild type -diagnosed in 07/2021 -s/p partial colectomy by Dr. Derrell Lolling 07/23/21, final path showed: pT4a lesion with LVI and PNI, no perforation; clear margins; 9/19 positive LNs, pN2b --He received Capox 09/11/21 - 01/15/22, oxali discontinued due to significant nausea/vomiting and headaches. He completed Xeloda in late June 2023.  -He unfortunately developed oligo liver metastasis based on PET/CT in 08/2022, liver biopsy was attempted but did not feasible.  -Guardian review was positive for circulating tumor DNA, consistent with recurrence. -Patient requested second opinion of surgery, was seen at [facility].  Neoadjuvant chemotherapy recommended before surgical resection, and he agreed. -He started chemotherapy FOLFIRI on 10/20/2022, no bevacizumab due to pending surgery  -NGS [employer] showed no targetable therapy, KRAS/NRAS/BRAF wild type, would not recommend EGFR inhibitor for first line due to his primary right side colon cancer.

## 2022-12-29 ENCOUNTER — Inpatient Hospital Stay: Payer: Medicaid Other

## 2022-12-29 ENCOUNTER — Encounter: Payer: Self-pay | Admitting: Hematology

## 2022-12-29 ENCOUNTER — Other Ambulatory Visit: Payer: Self-pay

## 2022-12-29 ENCOUNTER — Inpatient Hospital Stay (HOSPITAL_BASED_OUTPATIENT_CLINIC_OR_DEPARTMENT_OTHER): Payer: Medicaid Other | Admitting: Hematology

## 2022-12-29 VITALS — BP 139/80 | HR 85 | Temp 98.0°F | Resp 16 | Ht 69.0 in | Wt 190.2 lb

## 2022-12-29 DIAGNOSIS — R197 Diarrhea, unspecified: Secondary | ICD-10-CM | POA: Diagnosis not present

## 2022-12-29 DIAGNOSIS — C182 Malignant neoplasm of ascending colon: Secondary | ICD-10-CM

## 2022-12-29 DIAGNOSIS — Z7952 Long term (current) use of systemic steroids: Secondary | ICD-10-CM | POA: Diagnosis not present

## 2022-12-29 DIAGNOSIS — D500 Iron deficiency anemia secondary to blood loss (chronic): Secondary | ICD-10-CM

## 2022-12-29 DIAGNOSIS — F1010 Alcohol abuse, uncomplicated: Secondary | ICD-10-CM | POA: Diagnosis not present

## 2022-12-29 DIAGNOSIS — Z79899 Other long term (current) drug therapy: Secondary | ICD-10-CM | POA: Diagnosis not present

## 2022-12-29 DIAGNOSIS — Z5111 Encounter for antineoplastic chemotherapy: Secondary | ICD-10-CM | POA: Diagnosis not present

## 2022-12-29 DIAGNOSIS — C787 Secondary malignant neoplasm of liver and intrahepatic bile duct: Secondary | ICD-10-CM | POA: Diagnosis not present

## 2022-12-29 DIAGNOSIS — Z95828 Presence of other vascular implants and grafts: Secondary | ICD-10-CM

## 2022-12-29 DIAGNOSIS — G40909 Epilepsy, unspecified, not intractable, without status epilepticus: Secondary | ICD-10-CM | POA: Diagnosis not present

## 2022-12-29 DIAGNOSIS — K1230 Oral mucositis (ulcerative), unspecified: Secondary | ICD-10-CM | POA: Diagnosis not present

## 2022-12-29 DIAGNOSIS — R51 Headache with orthostatic component, not elsewhere classified: Secondary | ICD-10-CM | POA: Diagnosis not present

## 2022-12-29 DIAGNOSIS — K6389 Other specified diseases of intestine: Secondary | ICD-10-CM | POA: Diagnosis not present

## 2022-12-29 DIAGNOSIS — K219 Gastro-esophageal reflux disease without esophagitis: Secondary | ICD-10-CM | POA: Diagnosis not present

## 2022-12-29 LAB — CBC WITH DIFFERENTIAL (CANCER CENTER ONLY)
Abs Immature Granulocytes: 0.04 10*3/uL (ref 0.00–0.07)
Basophils Absolute: 0 10*3/uL (ref 0.0–0.1)
Basophils Relative: 1 %
Eosinophils Absolute: 0.3 10*3/uL (ref 0.0–0.5)
Eosinophils Relative: 6 %
HCT: 34.4 % — ABNORMAL LOW (ref 39.0–52.0)
Hemoglobin: 11.2 g/dL — ABNORMAL LOW (ref 13.0–17.0)
Immature Granulocytes: 1 %
Lymphocytes Relative: 43 %
Lymphs Abs: 2.1 10*3/uL (ref 0.7–4.0)
MCH: 28.4 pg (ref 26.0–34.0)
MCHC: 32.6 g/dL (ref 30.0–36.0)
MCV: 87.3 fL (ref 80.0–100.0)
Monocytes Absolute: 0.5 10*3/uL (ref 0.1–1.0)
Monocytes Relative: 10 %
Neutro Abs: 1.9 10*3/uL (ref 1.7–7.7)
Neutrophils Relative %: 39 %
Platelet Count: 245 10*3/uL (ref 150–400)
RBC: 3.94 MIL/uL — ABNORMAL LOW (ref 4.22–5.81)
RDW: 14.2 % (ref 11.5–15.5)
WBC Count: 4.8 10*3/uL (ref 4.0–10.5)
nRBC: 0 % (ref 0.0–0.2)

## 2022-12-29 LAB — CMP (CANCER CENTER ONLY)
ALT: 32 U/L (ref 0–44)
AST: 21 U/L (ref 15–41)
Albumin: 4.1 g/dL (ref 3.5–5.0)
Alkaline Phosphatase: 101 U/L (ref 38–126)
Anion gap: 7 (ref 5–15)
BUN: 11 mg/dL (ref 6–20)
CO2: 27 mmol/L (ref 22–32)
Calcium: 9.1 mg/dL (ref 8.9–10.3)
Chloride: 108 mmol/L (ref 98–111)
Creatinine: 1.09 mg/dL (ref 0.61–1.24)
GFR, Estimated: >60 mL/min (ref >60)
Glucose, Bld: 116 mg/dL — ABNORMAL HIGH (ref 70–99)
Potassium: 4 mmol/L (ref 3.5–5.1)
Sodium: 142 mmol/L (ref 135–145)
Total Bilirubin: 0.3 mg/dL (ref 0.3–1.2)
Total Protein: 7.4 g/dL (ref 6.5–8.1)

## 2022-12-29 MED ORDER — SODIUM CHLORIDE 0.9 % IV SOLN
180.0000 mg/m2 | Freq: Once | INTRAVENOUS | Status: AC
  Administered 2022-12-29: 400 mg via INTRAVENOUS
  Filled 2022-12-29: qty 20

## 2022-12-29 MED ORDER — SODIUM CHLORIDE 0.9 % IV SOLN
400.0000 mg/m2 | Freq: Once | INTRAVENOUS | Status: AC
  Administered 2022-12-29: 804 mg via INTRAVENOUS
  Filled 2022-12-29: qty 25

## 2022-12-29 MED ORDER — SODIUM CHLORIDE 0.9 % IV SOLN
150.0000 mg | Freq: Once | INTRAVENOUS | Status: AC
  Administered 2022-12-29: 150 mg via INTRAVENOUS
  Filled 2022-12-29: qty 150

## 2022-12-29 MED ORDER — PALONOSETRON HCL INJECTION 0.25 MG/5ML
0.2500 mg | Freq: Once | INTRAVENOUS | Status: AC
  Administered 2022-12-29: 0.25 mg via INTRAVENOUS
  Filled 2022-12-29: qty 5

## 2022-12-29 MED ORDER — SODIUM CHLORIDE 0.9 % IV SOLN
10.0000 mg | Freq: Once | INTRAVENOUS | Status: AC
  Administered 2022-12-29: 10 mg via INTRAVENOUS
  Filled 2022-12-29: qty 10

## 2022-12-29 MED ORDER — FLUOROURACIL CHEMO INJECTION 2.5 GM/50ML
400.0000 mg/m2 | Freq: Once | INTRAVENOUS | Status: AC
  Administered 2022-12-29: 800 mg via INTRAVENOUS
  Filled 2022-12-29: qty 16

## 2022-12-29 MED ORDER — SODIUM CHLORIDE 0.9 % IV SOLN: Freq: Once | INTRAVENOUS | Status: AC

## 2022-12-29 MED ORDER — ATROPINE SULFATE 1 MG/ML IV SOLN
0.5000 mg | Freq: Once | INTRAVENOUS | Status: AC | PRN
  Administered 2022-12-29: 0.5 mg via INTRAVENOUS
  Filled 2022-12-29: qty 1

## 2022-12-29 MED ORDER — SODIUM CHLORIDE 0.9% FLUSH
10.0000 mL | Freq: Once | INTRAVENOUS | Status: AC
  Administered 2022-12-29: 10 mL

## 2022-12-29 MED ORDER — SODIUM CHLORIDE 0.9 % IV SOLN
2400.0000 mg/m2 | INTRAVENOUS | Status: DC
  Administered 2022-12-29: 5000 mg via INTRAVENOUS
  Filled 2022-12-29: qty 100

## 2022-12-29 NOTE — Progress Notes (Signed)
Nutrition Follow-up:  Patient with stage III cancer of right colon, now with recurrence in liver.  Followed by Dr Mosetta Putt.  Receiving fofiri.  Planning surgery.  Met with patient during infusion.  Reports nausea this am, medications given and aromatherapy during treatment.  Says that he usually eats eggs, grits, [place] sausage/bacon for breakfast. Last night ate beanie weanies.  Likes spaghetti.  Snacks on mixed nuts and peanut butter and jelly sandwiches.  Says that he usually has diarrhea for the 2-3 days after treatment.  Drinks boost every day or every other day.      Medications: reviewed  Labs: reviewed  Anthropometrics:   Weight 190 lb 3.2 oz  186 lb 12.8 oz on 4/2 UBW of 192 lb (Dec 23)   NUTRITION DIAGNOSIS: Sub optimal energy intake ongoing    INTERVENTION:  Reviewed foods high in protein and importance Encouraged oral nutrition supplement daily    MONITORING, EVALUATION, GOAL: weight trends, intake   NEXT VISIT: to be determined with treatment  Joseph Haney B. Freida Busman, RD, LDN Registered Dietitian [phone]

## 2022-12-29 NOTE — Progress Notes (Signed)
[facility]   [phone] Fax:([phone]   Clinic Follow up Note   Patient Care Team: Frederic Jericho, [name] as PCP - General (Physician Assistant) Axel Filler, MD as Consulting Physician (General Surgery) Pollyann Samples, NP as Nurse Practitioner (Nurse Practitioner) Malachy Mood, MD as Consulting Physician (Hematology) Ewing Schlein, MD as Referring Physician (Pain Medicine)  Date of Service:  12/29/2022  CHIEF COMPLAINT: f/u of recurrent colon cancer   CURRENT THERAPY: Neoadjuvant FOLFIRI q14 days, starting 10/20/22    ASSESSMENT:  Joseph Haney is a 47 y.o. 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 Derrell Lolling 07/23/21, final path showed: pT4a lesion with LVI and PNI, no perforation; clear margins; 9/19 positive LNs, pN2b --He received Capox 09/11/21 - 01/15/22, oxali discontinued due to significant nausea/vomiting and headaches. He completed Xeloda in late June 2023.  -He unfortunately developed oligo liver metastasis based on PET/CT in 08/2022, liver biopsy was attempted but did not feasible.  -Guardian review was positive for circulating tumor DNA, consistent with recurrence. -Patient requested second opinion of surgery, was seen at [facility].  Neoadjuvant chemotherapy recommended before surgical resection, and he agreed. -He started chemotherapy FOLFIRI on 10/20/2022, no bevacizumab due to pending surgery  -NGS [facility] showed no targetable therapy, KRAS/NRAS/BRAF wild type, would not recommend EGFR inhibitor for first line due to his primary right side colon cancer.  -He is doing well, lab reviewed, adequate for treatment, will proceed cycle 6 FOLFIRI today.  This is the last scheduled chemo. -He has appointment for scan and follow-up with Dr. Gwenlyn Perking later this week.     PLAN: -lab reviewed. -discuss Surgery and possibly oral chemo  after surgery. -Phone visit after his visit at [facility]   SUMMARY OF ONCOLOGIC HISTORY: Oncology History Overview Note   Cancer Staging  Cancer of right colon [facility]) Staging form: Colon and Rectum, AJCC 8th Edition - Pathologic stage from 07/23/2021: Stage IIIC (pT4a, pN2b, cM0) - Signed by Pollyann Samples, NP on 08/15/2021 Stage prefix: Initial diagnosis Histologic grading system: 4 grade system Histologic grade (G): G2 Laterality: Right Lymph-vascular invasion (LVI): LVI present/identified, NOS Perineural invasion (PNI): Present Microsatellite instability (MSI): Stable     Cancer of right colon (HCC)  07/19/2021 Imaging   CT AP IMPRESSION: Focal area of high-grade narrowing of the proximal ascending colon may be sequela of chronic inflammation and stricture versus a mass. There is mild distension of the cecum and terminal ileum. Clinical correlation and further evaluation with lower GI study or colonoscopy recommended.   07/22/2021 Procedure   Colonoscopy A severe stenosis was found in the distal ascending colon and was non-traversed. It was ulcerated and inflamed. This could be inflammatory versus malignant stricture.   07/22/2021 Initial Biopsy   FINAL MICROSCOPIC DIAGNOSIS:  A. COLON, ASCENDING, BIOPSY:  - Invasive colorectal adenocarcinoma, moderately differentiated.    07/23/2021 Definitive Surgery   PRE-OPERATIVE DIAGNOSIS:  right colon mass POST-OPERATIVE DIAGNOSIS:  right colon mass PROCEDURE:  Procedure(s): XI ROBOT ASSISTED LAPAROSCOPIC RIGHT COLECTOMY (N/A) Dr. Derrell Lolling   07/23/2021 Pathology Results   FINAL MICROSCOPIC DIAGNOSIS:  A. COLON, RIGHT, RESECTION:  - Invasive moderately differentiated colonic adenocarcinoma, 4.5 cm.  - Carcinoma extends into pericolonic connective tissue and focally  involves serosal surface.  - Margins not involved.  - Metastatic carcinoma in nine of nineteen lymph nodes (9/19).  - Unremarkable appendix. MMR normal, MSI-stable  07/23/2021 Cancer Staging   Staging form: Colon and Rectum, AJCC 8th Edition - Pathologic stage from 07/23/2021: Stage IIIC (pT4a, pN2b, cM0) - Signed by Pollyann Samples, NP on 08/15/2021 Stage prefix: Initial diagnosis Histologic grading system: 4 grade system Histologic grade (G): G2 Laterality: Right Lymph-vascular invasion (LVI): LVI present/identified, NOS Perineural invasion (PNI): Present Microsatellite instability (MSI): Stable   07/24/2021 Initial Diagnosis   Colon adenocarcinoma (HCC)   07/25/2021 Tumor Marker   CEA: 2.9   07/28/2021 Imaging   CT chest IMPRESSION: 1. No evidence of metastatic disease within the chest. 2. Lungs demonstrate atelectasis, most evident lower lobes and bases of the left upper lobe lingula and right middle lobe. No convincing pneumonia and no evidence of pulmonary edema.     09/11/2021 - 01/15/2022 Chemotherapy   Patient is on Treatment Plan : COLORECTAL Xelox (Capeox) q21d     08/2022 Progression   PET scan showed hypermetabolic oligo liver metastasis, biopsy attempted but liver lesion was not seen by [place]. GuardantReveal was also positive for circulating tumor DNA    09/02/2022 - 09/02/2022 Chemotherapy   Patient is on Treatment Plan : COLORECTAL FOLFIRI + Bevacizumab q14d      Genetic Testing   Negative genetic testing. No pathogenic variants identified on the [facility] Multi-Cancer+RNA panel. VUS in APC called [id number] identified. The report date is 09/15/2022.  The Multi-Cancer + RNA Panel offered by [facility] includes sequencing and/or deletion/duplication analysis of the following 70 genes:  AIP*, ALK, APC*, ATM*, AXIN2*, BAP1*, BARD1*, BLM*, BMPR1A*, BRCA1*, BRCA2*, BRIP1*, CDC73*, CDH1*, CDK4, CDKN1B*, CDKN2A, CHEK2*, CTNNA1*, DICER1*, EPCAM, EGFR, FH*, FLCN*, GREM1, HOXB13, KIT, LZTR1, MAX*, MBD4, MEN1*, MET, MITF, MLH1*, MSH2*, MSH3*, MSH6*, MUTYH*, NF1*, NF2*, NTHL1*, PALB2*, PDGFRA, PMS2*, POLD1*, POLE*, POT1*, PRKAR1A*, PTCH1*,  PTEN*, RAD51C*, RAD51D*, RB1*, RET, SDHA*, SDHAF2*, SDHB*, SDHC*, SDHD*, SMAD4*, SMARCA4*, SMARCB1*, SMARCE1*, STK11*, SUFU*, TMEM127*, TP53*, TSC1*, TSC2*, VHL*. RNA analysis is performed for * genes.   10/20/2022 -  Chemotherapy   Patient is on Treatment Plan : COLORECTAL FOLFIRI q14d        INTERVAL HISTORY:  Joseph Haney is here for a follow up of recurrent colon cancer  He was last seen by me on 12/15/2022. He presents to the clinic alone. Pt state he did well from last cycle of treatment. Pt denied have numbness and tingling. He had very little diarrhea.    All other systems were reviewed with the patient and are negative.  MEDICAL HISTORY:  Past Medical History:  Diagnosis Date   Alcohol abuse    Cancer (HCC)    GERD (gastroesophageal reflux disease)    Seizure (HCC)     SURGICAL HISTORY: Past Surgical History:  Procedure Laterality Date   BIOPSY  07/22/2021   Procedure: BIOPSY;  Surgeon: Kathi Der, MD;  Location: [facility];  Service: Gastroenterology;;   COLON SURGERY     COLONOSCOPY WITH PROPOFOL N/A 07/22/2021   Procedure: COLONOSCOPY WITH PROPOFOL;  Surgeon: Kathi Der, MD;  Location: [facility];  Service: Gastroenterology;  Laterality: N/A;   IR IMAGING GUIDED PORT INSERTION  10/17/2022   NO PAST SURGERIES     SUBMUCOSAL TATTOO INJECTION  07/22/2021   Procedure: SUBMUCOSAL TATTOO INJECTION;  Surgeon: Kathi Der, MD;  Location: [facility];  Service: Gastroenterology;;    I have reviewed the social history and family history with the patient and they are unchanged from previous note.  ALLERGIES:  is allergic to latex and morphine and related.  MEDICATIONS:  Current Outpatient Medications  Medication Sig Dispense Refill   carvedilol (COREG) 25 MG tablet Take 1 tablet (25 mg total) by mouth 2 (two) times daily with a meal. 30 tablet 0   cyclobenzaprine (FLEXERIL) 10 MG tablet Take 10-20 mg by mouth 2 (two) times daily as needed for  muscle spasms.     dexamethasone (DECADRON) 4 MG tablet Take 1 tablet (4 mg total) by mouth daily. Take for 3-5 days after chemo for nausea and fatigue 20 tablet 1   diphenoxylate-atropine (LOMOTIL) 2.5-0.025 MG tablet Take 1 tablet by mouth 4 (four) times daily as needed for diarrhea or loose stools. 30 tablet 0   docusate sodium (COLACE) 100 MG capsule Take 100 mg by mouth daily.     escitalopram (LEXAPRO) 10 MG tablet Take 10 mg by mouth daily.     ferrous sulfate 325 (65 FE) MG tablet Take 1 tablet (325 mg total) by mouth 2 (two) times daily with a meal. 100 tablet 3   gabapentin (NEURONTIN) 400 MG capsule Take 400 mg by mouth every 6 (six) hours as needed.     HYDROcodone-acetaminophen (NORCO/VICODIN) 5-325 MG tablet Take 1 tablet by mouth every 6 (six) hours as needed for moderate pain.     lidocaine-prilocaine (EMLA) cream Apply to affected area once 30 g 3   magic mouthwash (nystatin, lidocaine, diphenhydrAMINE, alum & mag hydroxide) suspension Take 5 mLs by mouth 3 (three) times daily as needed for mouth pain. 140 mL 1   ondansetron (ZOFRAN) 8 MG tablet Take 1 tablet (8 mg total) by mouth every 8 (eight) hours as needed for nausea, vomiting or refractory nausea / vomiting. Start on the third day after chemotherapy. 30 tablet 1   oxyCODONE (ROXICODONE) 5 MG immediate release tablet Take 1 tablet (5 mg total) by mouth every 6 (six) hours as needed for severe pain. 20 tablet 0   polyethylene glycol (MIRALAX / GLYCOLAX) 17 g packet Take 17 g by mouth daily as needed for mild constipation or moderate constipation. 14 each 0   potassium chloride SA (KLOR-CON M) 20 MEQ tablet Take 1 tablet (20 mEq total) by mouth daily for 5 days. 5 tablet 0   prochlorperazine (COMPAZINE) 10 MG tablet Take 1 tablet (10 mg total) by mouth every 6 (six) hours as needed for nausea or vomiting. 30 tablet 1   promethazine (PHENERGAN) 25 MG tablet Take 1 tablet (25 mg total) by mouth every 6 (six) hours as needed for  nausea or vomiting. 30 tablet 1   triamcinolone cream (KENALOG) 0.1 % Apply 1 Application topically daily as needed (for rash and itching).     No current facility-administered medications for this visit.   Facility-Administered Medications Ordered in Other Visits  Medication Dose Route Frequency Provider Last Rate Last Admin   fluorouracil (ADRUCIL) 5,000 mg in sodium chloride 0.9 % 150 mL chemo infusion  2,400 mg/m2 (Treatment Plan Recorded) Intravenous 1 day or 1 dose Malachy Mood, MD   Infusion Verify at 12/29/22 1427    PHYSICAL EXAMINATION: ECOG PERFORMANCE STATUS: 1 - Symptomatic but completely ambulatory  Vitals:   12/29/22 1017  BP: 139/80  Pulse: 85  Resp: 16  Temp: 98 F (36.7 C)  SpO2: 96%   Wt Readings from Last 3 Encounters:  12/29/22 190 lb 3.2 oz (86.3 kg)  12/15/22 185 lb 4.8 oz (84.1 kg)  12/02/22 186 lb 12.8 oz (84.7 kg)     GENERAL:alert, no distress and comfortable SKIN: skin color normal, no rashes or significant lesions  EYES: normal, Conjunctiva are pink and non-injected, sclera clear  NEURO: alert & oriented x 3 with fluent speech    LABORATORY DATA:  I have reviewed the data as listed    Latest Ref Rng & Units 12/29/2022    9:49 AM 12/15/2022   10:22 AM 12/02/2022    9:38 AM  CBC  WBC 4.0 - 10.5 K/uL 4.8  5.5  5.1   Hemoglobin 13.0 - 17.0 g/dL 96.0  45.4  09.8   Hematocrit 39.0 - 52.0 % 34.4  35.9  34.6   Platelets 150 - 400 K/uL 245  257  245         Latest Ref Rng & Units 12/29/2022    9:49 AM 12/15/2022   10:22 AM 12/02/2022    9:38 AM  CMP  Glucose 70 - 99 mg/dL 119  147  829   BUN 6 - 20 mg/dL 11  15  9    Creatinine 0.61 - 1.24 mg/dL 5.62  1.30  8.65   Sodium 135 - 145 mmol/L 142  141  142   Potassium 3.5 - 5.1 mmol/L 4.0  3.5  3.8   Chloride 98 - 111 mmol/L 108  105  107   CO2 22 - 32 mmol/L 27  27  27    Calcium 8.9 - 10.3 mg/dL 9.1  9.7  9.4   Total Protein 6.5 - 8.1 g/dL 7.4  8.1  7.8   Total Bilirubin 0.3 - 1.2 mg/dL 0.3  0.3   0.3   Alkaline Phos 38 - 126 U/L 101  112  99   AST 15 - 41 U/L 21  20  15    ALT 0 - 44 U/L 32  55  18       RADIOGRAPHIC STUDIES: I have personally reviewed the radiological images as listed and agreed with the findings in the report. No results found.    No orders of the defined types were placed in this encounter.  All questions were answered. The patient knows to call the clinic with any problems, questions or concerns. No barriers to learning was detected. The total time spent in the appointment was 25 minutes.     Malachy Mood, MD 12/29/2022   Carolin Coy, CMA, am acting as scribe for Malachy Mood, MD.   I have reviewed the above documentation for accuracy and completeness, and I agree with the above.

## 2022-12-29 NOTE — Patient Instructions (Signed)
[place] [facility]  Discharge Instructions: Thank you for choosing [facility] to provide your oncology and hematology care.   If you have a lab appointment with the Cancer Center, please go directly to the Cancer Center and check in at the registration area.   Wear comfortable clothing and clothing appropriate for easy access to any Portacath or PICC line.   We strive to give you quality time with your provider. You may need to reschedule your appointment if you arrive late (15 or more minutes).  Arriving late affects you and other patients whose appointments are after yours.  Also, if you miss three or more appointments without notifying the office, you may be dismissed from the clinic at the provider's discretion.      For prescription refill requests, have your pharmacy contact our office and allow 72 hours for refills to be completed.    Today you received the following chemotherapy and/or immunotherapy agents : Irinotecan, leucovorin, 5FU      To help prevent nausea and vomiting after your treatment, we encourage you to take your nausea medication as directed.  BELOW ARE SYMPTOMS THAT SHOULD BE REPORTED IMMEDIATELY: *FEVER GREATER THAN 100.4 F (38 C) OR HIGHER *CHILLS OR SWEATING *NAUSEA AND VOMITING THAT IS NOT CONTROLLED WITH YOUR NAUSEA MEDICATION *UNUSUAL SHORTNESS OF BREATH *UNUSUAL BRUISING OR BLEEDING *URINARY PROBLEMS (pain or burning when urinating, or frequent urination) *BOWEL PROBLEMS (unusual diarrhea, constipation, pain near the anus) TENDERNESS IN MOUTH AND THROAT WITH OR WITHOUT PRESENCE OF ULCERS (sore throat, sores in mouth, or a toothache) UNUSUAL RASH, SWELLING OR PAIN  UNUSUAL VAGINAL DISCHARGE OR ITCHING   Items with * indicate a potential emergency and should be followed up as soon as possible or go to the Emergency Department if any problems should occur.  Please show the CHEMOTHERAPY ALERT CARD or IMMUNOTHERAPY  ALERT CARD at check-in to the Emergency Department and triage nurse.  Should you have questions after your visit or need to cancel or reschedule your appointment, please contact [place] [facility]  Dept: [phone]  and follow the prompts.  Office hours are 8:00 a.m. to 4:30 p.m. Monday - Friday. Please note that voicemails left after 4:00 p.m. may not be returned until the following business day.  We are closed weekends and major holidays. You have access to a nurse at all times for urgent questions. Please call the main number to the clinic Dept: [phone] and follow the prompts.   For any non-urgent questions, you may also contact your provider using MyChart. We now offer e-Visits for anyone 10 and older to request care online for non-urgent symptoms. For details visit [email or web address].   Also download the MyChart app! Go to the app store, search "MyChart", open the app, select [place], and log in with your MyChart username and password.

## 2022-12-31 ENCOUNTER — Other Ambulatory Visit: Payer: Self-pay

## 2022-12-31 ENCOUNTER — Inpatient Hospital Stay: Payer: Medicaid Other | Attending: Nurse Practitioner

## 2022-12-31 VITALS — BP 141/90 | HR 85 | Temp 99.3°F | Resp 18

## 2022-12-31 DIAGNOSIS — Z452 Encounter for adjustment and management of vascular access device: Secondary | ICD-10-CM | POA: Insufficient documentation

## 2022-12-31 DIAGNOSIS — C182 Malignant neoplasm of ascending colon: Secondary | ICD-10-CM

## 2022-12-31 DIAGNOSIS — C184 Malignant neoplasm of transverse colon: Secondary | ICD-10-CM | POA: Diagnosis not present

## 2022-12-31 MED ORDER — SODIUM CHLORIDE 0.9% FLUSH
10.0000 mL | INTRAVENOUS | Status: DC | PRN
  Administered 2022-12-31: 10 mL

## 2022-12-31 MED ORDER — HEPARIN SOD (PORK) LOCK FLUSH 100 UNIT/ML IV SOLN
500.0000 [IU] | Freq: Once | INTRAVENOUS | Status: AC | PRN
  Administered 2022-12-31: 500 [IU]

## 2023-01-02 ENCOUNTER — Other Ambulatory Visit: Payer: Self-pay

## 2023-01-02 DIAGNOSIS — C182 Malignant neoplasm of ascending colon: Secondary | ICD-10-CM

## 2023-01-02 MED ORDER — ONDANSETRON HCL 8 MG PO TABS
8.0000 mg | ORAL_TABLET | Freq: Three times a day (TID) | ORAL | 1 refills | Status: DC | PRN
Start: 2023-01-02 — End: 2023-05-12

## 2023-01-02 MED ORDER — PROCHLORPERAZINE MALEATE 10 MG PO TABS
10.0000 mg | ORAL_TABLET | Freq: Four times a day (QID) | ORAL | 1 refills | Status: DC | PRN
Start: 2023-01-02 — End: 2023-05-12

## 2023-01-02 MED ORDER — PROMETHAZINE HCL 25 MG PO TABS: 25.0000 mg | ORAL_TABLET | Freq: Four times a day (QID) | ORAL | 1 refills | Status: DC | PRN

## 2023-01-07 ENCOUNTER — Inpatient Hospital Stay (HOSPITAL_COMMUNITY)
Admission: EM | Admit: 2023-01-07 | Discharge: 2023-01-10 | DRG: 372 | Disposition: A | Discharge status: Home / Self Care | Payer: Medicaid Other | Attending: Internal Medicine | Admitting: Internal Medicine

## 2023-01-07 ENCOUNTER — Other Ambulatory Visit: Payer: Self-pay

## 2023-01-07 ENCOUNTER — Encounter (HOSPITAL_COMMUNITY): Payer: Self-pay | Admitting: Internal Medicine

## 2023-01-07 ENCOUNTER — Emergency Department (HOSPITAL_COMMUNITY): Payer: Medicaid Other

## 2023-01-07 DIAGNOSIS — T451X5A Adverse effect of antineoplastic and immunosuppressive drugs, initial encounter: Secondary | ICD-10-CM | POA: Diagnosis present

## 2023-01-07 DIAGNOSIS — R079 Chest pain, unspecified: Secondary | ICD-10-CM | POA: Diagnosis not present

## 2023-01-07 DIAGNOSIS — Z79899 Other long term (current) drug therapy: Secondary | ICD-10-CM

## 2023-01-07 DIAGNOSIS — Z1152 Encounter for screening for COVID-19: Secondary | ICD-10-CM

## 2023-01-07 DIAGNOSIS — Z8249 Family history of ischemic heart disease and other diseases of the circulatory system: Secondary | ICD-10-CM

## 2023-01-07 DIAGNOSIS — J9811 Atelectasis: Secondary | ICD-10-CM | POA: Diagnosis not present

## 2023-01-07 DIAGNOSIS — E876 Hypokalemia: Secondary | ICD-10-CM | POA: Diagnosis present

## 2023-01-07 DIAGNOSIS — R000 Tachycardia, unspecified: Secondary | ICD-10-CM | POA: Diagnosis not present

## 2023-01-07 DIAGNOSIS — C189 Malignant neoplasm of colon, unspecified: Secondary | ICD-10-CM | POA: Diagnosis present

## 2023-01-07 DIAGNOSIS — E860 Dehydration: Secondary | ICD-10-CM | POA: Diagnosis present

## 2023-01-07 DIAGNOSIS — A419 Sepsis, unspecified organism: Principal | ICD-10-CM

## 2023-01-07 DIAGNOSIS — Z91040 Latex allergy status: Secondary | ICD-10-CM

## 2023-01-07 DIAGNOSIS — K219 Gastro-esophageal reflux disease without esophagitis: Secondary | ICD-10-CM | POA: Diagnosis present

## 2023-01-07 DIAGNOSIS — K529 Noninfective gastroenteritis and colitis, unspecified: Secondary | ICD-10-CM | POA: Diagnosis not present

## 2023-01-07 DIAGNOSIS — I10 Essential (primary) hypertension: Secondary | ICD-10-CM | POA: Diagnosis present

## 2023-01-07 DIAGNOSIS — Z885 Allergy status to narcotic agent status: Secondary | ICD-10-CM

## 2023-01-07 DIAGNOSIS — A045 Campylobacter enteritis: Principal | ICD-10-CM | POA: Diagnosis present

## 2023-01-07 DIAGNOSIS — C182 Malignant neoplasm of ascending colon: Secondary | ICD-10-CM | POA: Diagnosis present

## 2023-01-07 DIAGNOSIS — R112 Nausea with vomiting, unspecified: Secondary | ICD-10-CM | POA: Diagnosis not present

## 2023-01-07 LAB — COMPREHENSIVE METABOLIC PANEL
ALT: 31 U/L (ref 0–44)
AST: 27 U/L (ref 15–41)
Albumin: 3.7 g/dL (ref 3.5–5.0)
Alkaline Phosphatase: 77 U/L (ref 38–126)
Anion gap: 12 (ref 5–15)
BUN: 17 mg/dL (ref 6–20)
CO2: 18 mmol/L — ABNORMAL LOW (ref 22–32)
Calcium: 8.8 mg/dL — ABNORMAL LOW (ref 8.9–10.3)
Chloride: 104 mmol/L (ref 98–111)
Creatinine, Ser: 1.07 mg/dL (ref 0.61–1.24)
GFR, Estimated: >60 mL/min (ref >60)
Glucose, Bld: 138 mg/dL — ABNORMAL HIGH (ref 70–99)
Potassium: 2.8 mmol/L — ABNORMAL LOW (ref 3.5–5.1)
Sodium: 134 mmol/L — ABNORMAL LOW (ref 135–145)
Total Bilirubin: 0.8 mg/dL (ref 0.3–1.2)
Total Protein: 8.2 g/dL — ABNORMAL HIGH (ref 6.5–8.1)

## 2023-01-07 LAB — URINE CULTURE

## 2023-01-07 LAB — BASIC METABOLIC PANEL
Anion gap: 13 (ref 5–15)
BUN: 18 mg/dL (ref 6–20)
CO2: 19 mmol/L — ABNORMAL LOW (ref 22–32)
Calcium: 8.7 mg/dL — ABNORMAL LOW (ref 8.9–10.3)
Chloride: 99 mmol/L (ref 98–111)
Creatinine, Ser: 1.17 mg/dL (ref 0.61–1.24)
GFR, Estimated: >60 mL/min (ref >60)
Glucose, Bld: 157 mg/dL — ABNORMAL HIGH (ref 70–99)
Potassium: 2.8 mmol/L — ABNORMAL LOW (ref 3.5–5.1)
Sodium: 131 mmol/L — ABNORMAL LOW (ref 135–145)

## 2023-01-07 LAB — CBC
HCT: 36.7 % — ABNORMAL LOW (ref 39.0–52.0)
Hemoglobin: 12.2 g/dL — ABNORMAL LOW (ref 13.0–17.0)
MCH: 28.2 pg (ref 26.0–34.0)
MCHC: 33.2 g/dL (ref 30.0–36.0)
MCV: 84.8 fL (ref 80.0–100.0)
Platelets: 360 10*3/uL (ref 150–400)
RBC: 4.33 MIL/uL (ref 4.22–5.81)
RDW: 13.7 % (ref 11.5–15.5)
WBC: 9 10*3/uL (ref 4.0–10.5)
nRBC: 0 % (ref 0.0–0.2)

## 2023-01-07 LAB — URINALYSIS, W/ REFLEX TO CULTURE (INFECTION SUSPECTED)
Bacteria, UA: NONE SEEN
Glucose, UA: NEGATIVE mg/dL
Hgb urine dipstick: NEGATIVE
Ketones, ur: NEGATIVE mg/dL
Leukocytes,Ua: NEGATIVE
Nitrite: NEGATIVE
Protein, ur: >=300 mg/dL — AB
Specific Gravity, Urine: >1.046 — ABNORMAL HIGH (ref 1.005–1.030)
pH: 5 (ref 5.0–8.0)

## 2023-01-07 LAB — TROPONIN I (HIGH SENSITIVITY)
Troponin I (High Sensitivity): 2 ng/L (ref <18)
Troponin I (High Sensitivity): 2 ng/L (ref <18)

## 2023-01-07 LAB — PROTIME-INR
INR: 1.2 (ref 0.8–1.2)
Prothrombin Time: 15 seconds (ref 11.4–15.2)

## 2023-01-07 LAB — MAGNESIUM: Magnesium: 2.1 mg/dL (ref 1.7–2.4)

## 2023-01-07 LAB — RESP PANEL BY RT-PCR (RSV, FLU A&B, COVID)  RVPGX2
Influenza A by PCR: NEGATIVE
Influenza B by PCR: NEGATIVE
Resp Syncytial Virus by PCR: NEGATIVE
SARS Coronavirus 2 by RT PCR: NEGATIVE

## 2023-01-07 LAB — APTT: aPTT: 27 seconds (ref 24–36)

## 2023-01-07 LAB — LACTIC ACID, PLASMA: Lactic Acid, Venous: 1.5 mmol/L (ref 0.5–1.9)

## 2023-01-07 LAB — POTASSIUM: Potassium: 3.4 mmol/L — ABNORMAL LOW (ref 3.5–5.1)

## 2023-01-07 MED ORDER — LACTATED RINGERS IV BOLUS (SEPSIS)
500.0000 mL | Freq: Once | INTRAVENOUS | Status: AC
  Administered 2023-01-07: 500 mL via INTRAVENOUS

## 2023-01-07 MED ORDER — HYDROMORPHONE HCL 2 MG/ML IJ SOLN
2.0000 mg | INTRAMUSCULAR | Status: DC | PRN
  Administered 2023-01-07 – 2023-01-10 (×12): 2 mg via INTRAVENOUS
  Filled 2023-01-07 (×12): qty 1

## 2023-01-07 MED ORDER — ESCITALOPRAM OXALATE 10 MG PO TABS
10.0000 mg | ORAL_TABLET | Freq: Every day | ORAL | Status: DC
  Administered 2023-01-07 – 2023-01-10 (×4): 10 mg via ORAL
  Filled 2023-01-07 (×4): qty 1

## 2023-01-07 MED ORDER — METRONIDAZOLE 500 MG/100ML IV SOLN
500.0000 mg | Freq: Two times a day (BID) | INTRAVENOUS | Status: DC
  Administered 2023-01-07 – 2023-01-09 (×4): 500 mg via INTRAVENOUS
  Filled 2023-01-07 (×4): qty 100

## 2023-01-07 MED ORDER — VANCOMYCIN HCL IN DEXTROSE 1-5 GM/200ML-% IV SOLN: 1000.0000 mg | Freq: Once | INTRAVENOUS | Status: DC

## 2023-01-07 MED ORDER — METRONIDAZOLE 500 MG/100ML IV SOLN
500.0000 mg | Freq: Once | INTRAVENOUS | Status: AC
  Administered 2023-01-07: 500 mg via INTRAVENOUS
  Filled 2023-01-07: qty 100

## 2023-01-07 MED ORDER — ENOXAPARIN SODIUM 40 MG/0.4ML IJ SOSY
40.0000 mg | PREFILLED_SYRINGE | INTRAMUSCULAR | Status: DC
  Administered 2023-01-07 – 2023-01-09 (×3): 40 mg via SUBCUTANEOUS
  Filled 2023-01-07 (×3): qty 0.4

## 2023-01-07 MED ORDER — ACETAMINOPHEN 325 MG PO TABS
650.0000 mg | ORAL_TABLET | Freq: Four times a day (QID) | ORAL | Status: DC | PRN
  Administered 2023-01-07: 650 mg via ORAL
  Filled 2023-01-07: qty 2

## 2023-01-07 MED ORDER — LEVOFLOXACIN IN D5W 500 MG/100ML IV SOLN
500.0000 mg | INTRAVENOUS | Status: DC
  Administered 2023-01-07 – 2023-01-08 (×2): 500 mg via INTRAVENOUS
  Filled 2023-01-07 (×2): qty 100

## 2023-01-07 MED ORDER — CYCLOBENZAPRINE HCL 10 MG PO TABS: 10.0000 mg | ORAL_TABLET | Freq: Two times a day (BID) | ORAL | Status: DC | PRN

## 2023-01-07 MED ORDER — LACTATED RINGERS IV BOLUS (SEPSIS)
1000.0000 mL | Freq: Once | INTRAVENOUS | Status: AC
  Administered 2023-01-07: 1000 mL via INTRAVENOUS

## 2023-01-07 MED ORDER — FENTANYL CITRATE PF 50 MCG/ML IJ SOSY
50.0000 ug | PREFILLED_SYRINGE | Freq: Once | INTRAMUSCULAR | Status: AC
  Administered 2023-01-07: 50 ug via INTRAVENOUS
  Filled 2023-01-07: qty 1

## 2023-01-07 MED ORDER — MAGNESIUM SULFATE 2 GM/50ML IV SOLN
2.0000 g | Freq: Once | INTRAVENOUS | Status: AC
  Administered 2023-01-07: 2 g via INTRAVENOUS
  Filled 2023-01-07: qty 50

## 2023-01-07 MED ORDER — VANCOMYCIN HCL 1750 MG/350ML IV SOLN
1750.0000 mg | Freq: Once | INTRAVENOUS | Status: AC
  Administered 2023-01-07: 1750 mg via INTRAVENOUS
  Filled 2023-01-07: qty 350

## 2023-01-07 MED ORDER — HYDROMORPHONE HCL 1 MG/ML IJ SOLN
0.5000 mg | INTRAMUSCULAR | Status: DC | PRN
  Administered 2023-01-07: 0.5 mg via INTRAVENOUS
  Filled 2023-01-07: qty 1

## 2023-01-07 MED ORDER — SODIUM CHLORIDE 0.9% FLUSH: 10.0000 mL | INTRAVENOUS | Status: DC | PRN

## 2023-01-07 MED ORDER — POTASSIUM CHLORIDE 10 MEQ/100ML IV SOLN
10.0000 meq | INTRAVENOUS | Status: AC
  Administered 2023-01-07 (×4): 10 meq via INTRAVENOUS
  Filled 2023-01-07 (×4): qty 100

## 2023-01-07 MED ORDER — OXYCODONE HCL 5 MG PO TABS
10.0000 mg | ORAL_TABLET | Freq: Four times a day (QID) | ORAL | Status: DC | PRN
  Administered 2023-01-07 – 2023-01-10 (×11): 10 mg via ORAL
  Filled 2023-01-07 (×13): qty 2

## 2023-01-07 MED ORDER — ONDANSETRON HCL 4 MG/2ML IJ SOLN
4.0000 mg | Freq: Once | INTRAMUSCULAR | Status: AC
  Administered 2023-01-07: 4 mg via INTRAVENOUS
  Filled 2023-01-07: qty 2

## 2023-01-07 MED ORDER — GABAPENTIN 300 MG PO CAPS
300.0000 mg | ORAL_CAPSULE | Freq: Three times a day (TID) | ORAL | Status: DC
  Administered 2023-01-07 – 2023-01-10 (×10): 300 mg via ORAL
  Filled 2023-01-07 (×10): qty 1

## 2023-01-07 MED ORDER — ACETAMINOPHEN 650 MG RE SUPP: 650.0000 mg | Freq: Four times a day (QID) | RECTAL | Status: DC | PRN

## 2023-01-07 MED ORDER — DICYCLOMINE HCL 10 MG PO CAPS
10.0000 mg | ORAL_CAPSULE | Freq: Three times a day (TID) | ORAL | Status: DC | PRN
  Administered 2023-01-07 – 2023-01-08 (×2): 10 mg via ORAL
  Filled 2023-01-07 (×2): qty 1

## 2023-01-07 MED ORDER — METOCLOPRAMIDE HCL 5 MG/ML IJ SOLN
10.0000 mg | Freq: Four times a day (QID) | INTRAMUSCULAR | Status: DC | PRN
  Administered 2023-01-07: 10 mg via INTRAVENOUS
  Filled 2023-01-07: qty 2

## 2023-01-07 MED ORDER — CARVEDILOL 25 MG PO TABS
25.0000 mg | ORAL_TABLET | Freq: Two times a day (BID) | ORAL | Status: DC
  Administered 2023-01-07 – 2023-01-10 (×6): 25 mg via ORAL
  Filled 2023-01-07 (×6): qty 1

## 2023-01-07 MED ORDER — ONDANSETRON HCL 4 MG/2ML IJ SOLN
4.0000 mg | Freq: Four times a day (QID) | INTRAMUSCULAR | Status: DC | PRN
  Administered 2023-01-09: 4 mg via INTRAVENOUS
  Filled 2023-01-07: qty 2

## 2023-01-07 MED ORDER — CHLORHEXIDINE GLUCONATE CLOTH 2 % EX PADS
6.0000 | MEDICATED_PAD | Freq: Every day | CUTANEOUS | Status: DC
  Administered 2023-01-08 – 2023-01-09 (×2): 6 via TOPICAL

## 2023-01-07 MED ORDER — LACTATED RINGERS IV SOLN: INTRAVENOUS | Status: AC

## 2023-01-07 MED ORDER — SODIUM CHLORIDE 0.9 % IV SOLN
2.0000 g | Freq: Once | INTRAVENOUS | Status: AC
  Administered 2023-01-07: 2 g via INTRAVENOUS
  Filled 2023-01-07: qty 12.5

## 2023-01-07 NOTE — ED Triage Notes (Signed)
Patient arrived stating he had chemo last Monday and has had generalized chest pain, generalized abdominal pain and vomiting since. Last dose of Oxycodone yesterday.

## 2023-01-07 NOTE — Progress Notes (Signed)
A consult was received from an ED physician for Vancomycin + Cefepime per pharmacy dosing.  The patient's profile has been reviewed for ht/wt/allergies/indication/available labs.   A one time order has been placed for Vancomycin 1750mg  IV x1 & Cefepime 2gm .  Further antibiotics/pharmacy consults should be ordered by admitting physician if indicated.                       Thank you, Junita Push PharmD 01/07/2023  3:24 AM

## 2023-01-07 NOTE — ED Notes (Signed)
ED TO INPATIENT HANDOFF REPORT  Name/Age/Gender Joseph Haney 47 y.o. male  Code Status    Code Status Orders  (From admission, onward)           Start     Ordered   01/07/23 0533  Full code  Continuous       Question:  By:  Answer:  Consent: discussion documented in EHR   01/07/23 0532           Code Status History     Date Active Date Inactive Code Status Order ID Comments User Context   05/03/2022 0030 05/07/2022 1626 Full Code [id number]  Rometta Emery, MD ED   07/20/2021 1717 08/01/2021 1947 Full Code [id number]  Azucena Fallen, MD Inpatient   11/26/2015 0032 11/30/2015 2056 Full Code [id number]  Bobette Mo, MD Inpatient   10/10/2015 1734 10/12/2015 1505 Full Code [id number]  Calvert Cantor, MD ED   05/21/2015 1040 05/22/2015 1933 Full Code [id number]  Ron Parker, MD Inpatient   05/20/2013 0242 05/23/2013 1601 Full Code [id number]  Houston Siren, MD Inpatient   10/12/2012 0537 10/13/2012 1706 Full Code [id number]  Manuela Schwartz Inpatient   07/25/2012 1645 07/27/2012 1726 Full Code [id number]  Artelia Laroche, RN Inpatient       Home/SNF/Other Home  Chief Complaint Dehydration [E86.0]  Level of Care/Admitting Diagnosis ED Disposition     ED Disposition  Admit   Condition  --   Comment  Hospital Area: [facility] [[id number]]  Level of Care: Telemetry [5]  Admit to tele based on following criteria: Monitor for Ischemic changes  May place patient in observation at [facility] or [facility] if equivalent level of care is available:: No  Covid Evaluation: Asymptomatic - no recent exposure (last 10 days) testing not required  Diagnosis: Dehydration [276.51.ICD-9-CM]  Admitting Physician: Angie Fava [[id number]]  Attending Physician: Angie Fava [[id number]]          Medical History Past Medical History:  Diagnosis Date   Alcohol abuse    Cancer (HCC)    GERD (gastroesophageal reflux disease)    Seizure  (HCC)     Allergies Allergies  Allergen Reactions   Latex Swelling and Other (See Comments)    Pain and swelling of penis after urinary catheter placed last surgery.   Morphine And Related Itching    IV Location/Drains/Wounds Patient Lines/Drains/Airways Status     Active Line/Drains/Airways     Name Placement date Placement time Site Days   Implanted Port 10/17/22 Right Chest 10/17/22  0937  Chest  82   Peripheral IV 01/07/23 20 G 1.88" Anterior;Left;Proximal;Upper Arm 01/07/23  0542  Arm  less than 1   Incision - 3 Ports Abdomen Upper;Left Umbilicus Lower;Right 07/23/21  1112  -- 533            Labs/Imaging Results for orders placed or performed during the hospital encounter of 01/07/23 (from the past 48 hour(s))  Basic metabolic panel     Status: Abnormal   Collection Time: 01/07/23  2:53 AM  Result Value Ref Range   Sodium 131 (L) 135 - 145 mmol/L   Potassium 2.8 (L) 3.5 - 5.1 mmol/L   Chloride 99 98 - 111 mmol/L   CO2 19 (L) 22 - 32 mmol/L   Glucose, Bld 157 (H) 70 - 99 mg/dL    Comment: Glucose reference range applies only to samples taken after fasting for at least 8 hours.   BUN 18  6 - 20 mg/dL   Creatinine, Ser 3.24 0.61 - 1.24 mg/dL   Calcium 8.7 (L) 8.9 - 10.3 mg/dL   GFR, Estimated >40 >10 mL/min    Comment: (NOTE) Calculated using the CKD-EPI Creatinine Equation (2021)    Anion gap 13 5 - 15    Comment: Performed at [facility], [address], [place], [place] [address]  CBC     Status: Abnormal   Collection Time: 01/07/23  2:53 AM  Result Value Ref Range   WBC 9.0 4.0 - 10.5 K/uL   RBC 4.33 4.22 - 5.81 MIL/uL   Hemoglobin 12.2 (L) 13.0 - 17.0 g/dL   HCT 66.4 (L) 40.3 - 47.4 %   MCV 84.8 80.0 - 100.0 fL   MCH 28.2 26.0 - 34.0 pg   MCHC 33.2 30.0 - 36.0 g/dL   RDW 25.9 56.3 - 87.5 %   Platelets 360 150 - 400 K/uL   nRBC 0.0 0.0 - 0.2 %    Comment: Performed at [facility], [address],  [place], [place] [address]  Troponin I (High Sensitivity)     Status: None   Collection Time: 01/07/23  2:53 AM  Result Value Ref Range   Troponin I (High Sensitivity) 2 <18 ng/L    Comment: (NOTE) Elevated high sensitivity troponin I (hsTnI) values and significant  changes across serial measurements may suggest ACS but many other  chronic and acute conditions are known to elevate hsTnI results.  Refer to the "Links" section for chest pain algorithms and additional  guidance. Performed at [facility], [address], [place], [place] [address]   Resp panel by RT-PCR (RSV, Flu A&B, Covid) Anterior Nasal Swab     Status: None   Collection Time: 01/07/23  3:11 AM   Specimen: Anterior Nasal Swab  Result Value Ref Range   SARS Coronavirus 2 by RT PCR NEGATIVE NEGATIVE    Comment: (NOTE) SARS-CoV-2 target nucleic acids are NOT DETECTED.  The SARS-CoV-2 RNA is generally detectable in upper respiratory specimens during the acute phase of infection. The lowest concentration of SARS-CoV-2 viral copies this assay can detect is 138 copies/mL. A negative result does not preclude SARS-Cov-2 infection and should not be used as the sole basis for treatment or other patient management decisions. A negative result may occur with  improper specimen collection/handling, submission of specimen other than nasopharyngeal swab, presence of viral mutation(s) within the areas targeted by this assay, and inadequate number of viral copies(<138 copies/mL). A negative result must be combined with clinical observations, patient history, and epidemiological information. The expected result is Negative.  Fact Sheet for Patients:  BloggerCourse.com  Fact Sheet for Healthcare Providers:  SeriousBroker.it  This test is no t yet approved or cleared by the [place] FDA and  has been authorized for detection and/or diagnosis of SARS-CoV-2 by FDA  under an Emergency Use Authorization (EUA). This EUA will remain  in effect (meaning this test can be used) for the duration of the COVID-19 declaration under Section 564(b)(1) of the Act, 21 U.S.C.section 360bbb-3(b)(1), unless the authorization is terminated  or revoked sooner.       Influenza A by PCR NEGATIVE NEGATIVE   Influenza B by PCR NEGATIVE NEGATIVE    Comment: (NOTE) The Xpert Xpress SARS-CoV-2/FLU/RSV plus assay is intended as an aid in the diagnosis of influenza from Nasopharyngeal swab specimens and should not be used as a sole basis for treatment. Nasal washings and  aspirates are unacceptable for Xpert Xpress SARS-CoV-2/FLU/RSV testing.  Fact Sheet for Patients: BloggerCourse.com  Fact Sheet for Healthcare Providers: SeriousBroker.it  This test is not yet approved or cleared by the [place] FDA and has been authorized for detection and/or diagnosis of SARS-CoV-2 by FDA under an Emergency Use Authorization (EUA). This EUA will remain in effect (meaning this test can be used) for the duration of the COVID-19 declaration under Section 564(b)(1) of the Act, 21 U.S.C. section 360bbb-3(b)(1), unless the authorization is terminated or revoked.     Resp Syncytial Virus by PCR NEGATIVE NEGATIVE    Comment: (NOTE) Fact Sheet for Patients: BloggerCourse.com  Fact Sheet for Healthcare Providers: SeriousBroker.it  This test is not yet approved or cleared by the [place] FDA and has been authorized for detection and/or diagnosis of SARS-CoV-2 by FDA under an Emergency Use Authorization (EUA). This EUA will remain in effect (meaning this test can be used) for the duration of the COVID-19 declaration under Section 564(b)(1) of the Act, 21 U.S.C. section 360bbb-3(b)(1), unless the authorization is terminated or revoked.  Performed at [facility], [address] [place], [place] [address]   Lactic acid, plasma     Status: None   Collection Time: 01/07/23  3:25 AM  Result Value Ref Range   Lactic Acid, Venous 1.5 0.5 - 1.9 mmol/L    Comment: Performed at [facility], [address], [place], [place] [address]  Protime-INR     Status: None   Collection Time: 01/07/23  3:25 AM  Result Value Ref Range   Prothrombin Time 15.0 11.4 - 15.2 seconds   INR 1.2 0.8 - 1.2    Comment: (NOTE) INR goal varies based on device and disease states. Performed at [facility], [address] [place], [place] [address]   APTT     Status: None   Collection Time: 01/07/23  3:25 AM  Result Value Ref Range   aPTT 27 24 - 36 seconds    Comment: Performed at [facility], [address], [place], [place] [address]  Comprehensive metabolic panel     Status: Abnormal   Collection Time: 01/07/23  3:55 AM  Result Value Ref Range   Sodium 134 (L) 135 - 145 mmol/L   Potassium 2.8 (L) 3.5 - 5.1 mmol/L   Chloride 104 98 - 111 mmol/L   CO2 18 (L) 22 - 32 mmol/L   Glucose, Bld 138 (H) 70 - 99 mg/dL    Comment: Glucose reference range applies only to samples taken after fasting for at least 8 hours.   BUN 17 6 - 20 mg/dL   Creatinine, Ser 2.13 0.61 - 1.24 mg/dL   Calcium 8.8 (L) 8.9 - 10.3 mg/dL   Total Protein 8.2 (H) 6.5 - 8.1 g/dL   Albumin 3.7 3.5 - 5.0 g/dL   AST 27 15 - 41 U/L   ALT 31 0 - 44 U/L   Alkaline Phosphatase 77 38 - 126 U/L   Total Bilirubin 0.8 0.3 - 1.2 mg/dL   GFR, Estimated >08 >65 mL/min    Comment: (NOTE) Calculated using the CKD-EPI Creatinine Equation (2021)    Anion gap 12 5 - 15    Comment: Performed at [facility], [address] [place], [place] [address]  Magnesium     Status: None   Collection Time: 01/07/23  3:55 AM  Result Value Ref Range   Magnesium 2.1 1.7 - 2.4  mg/dL    Comment: Performed at [facility], [address], [place], [place] [address]  Troponin I (High Sensitivity)     Status: None   Collection Time: 01/07/23  6:20 AM  Result Value Ref Range   Troponin I (High Sensitivity) 2 <18 ng/L    Comment: (NOTE) Elevated high sensitivity troponin I (hsTnI) values and significant  changes across serial measurements may suggest ACS but many other  chronic and acute conditions are known to elevate hsTnI results.  Refer to the "Links" section for chest pain algorithms and additional  guidance. Performed at [facility], [address], [place], [place] [address]   Urinalysis, w/ Reflex to Culture (Infection Suspected) -Urine, Clean Catch     Status: Abnormal   Collection Time: 01/07/23  7:12 AM  Result Value Ref Range   Specimen Source URINE, CLEAN CATCH    Color, Urine AMBER (A) YELLOW    Comment: BIOCHEMICALS MAY BE AFFECTED BY COLOR   APPearance CLOUDY (A) CLEAR   Specific Gravity, Urine >1.046 (H) 1.005 - 1.030   pH 5.0 5.0 - 8.0   Glucose, UA NEGATIVE NEGATIVE mg/dL   Hgb urine dipstick NEGATIVE NEGATIVE   Bilirubin Urine SMALL (A) NEGATIVE   Ketones, ur NEGATIVE NEGATIVE mg/dL   Protein, ur >=098 (A) NEGATIVE mg/dL   Nitrite NEGATIVE NEGATIVE   Leukocytes,Ua NEGATIVE NEGATIVE   RBC / HPF 6-10 0 - 5 RBC/hpf   WBC, UA 11-20 0 - 5 WBC/hpf    Comment:        Reflex urine culture not performed if WBC <=10, OR if Squamous epithelial cells >5. If Squamous epithelial cells >5 suggest recollection.    Bacteria, UA NONE SEEN NONE SEEN   Squamous Epithelial / HPF 0-5 0 - 5 /HPF   Mucus PRESENT    Hyaline Casts, UA PRESENT    Granular Casts, UA PRESENT     Comment: Performed at [facility] [place], [address], [place], [place] [address]  Urine Culture     Status: None (Preliminary result)   Collection Time: 01/07/23  7:12 AM   Specimen: Urine, Clean Catch  Result Value Ref Range   Specimen Description      URINE, CLEAN  CATCH Performed at [facility], [address] [place], [place] [address]    Special Requests      NONE Reflexed from [id number] Performed at [facility], [address] [place] [address] [place], [place] [address]    Culture PENDING    Report Status PENDING   Potassium     Status: Abnormal   Collection Time: 01/07/23 10:44 AM  Result Value Ref Range   Potassium 3.4 (L) 3.5 - 5.1 mmol/L    Comment: Performed at [facility], [address] [place], [place] [address]   CT Renal Stone Study  Result Date: 01/07/2023 CLINICAL DATA:  Abdominal/flank pain.  Metastatic colon cancer. EXAM: CT ABDOMEN AND PELVIS WITHOUT CONTRAST TECHNIQUE: Multidetector CT imaging of the abdomen and pelvis was performed following the standard protocol without IV contrast. RADIATION DOSE REDUCTION: This exam was performed according to the departmental dose-optimization program which includes automated exposure control, adjustment of the mA and/or kV according to patient size and/or use of iterative reconstruction technique. COMPARISON:  09/19/2022 FINDINGS: Lower chest: Streaky density with volume loss at the right more than left lung base attributed to scarring when compared to prior. Coronary atherosclerosis. Hepatobiliary: No low-density mass/metastasis in the  upper central liver measuring 2.5 cm.Cholelithiasis. No biliary dilatation. Pancreas: Unremarkable. Spleen: Unremarkable. Adrenals/Urinary Tract: Negative adrenals. No hydronephrosis or stone. Unremarkable bladder. Stomach/Bowel: Proximal colectomy with unremarkable enterocolonic anastomosis. Mild low-density thickening of the distal ileum before the anastomosis. Some distal colonic fluid levels are noted and there is mild accentuation of ileocolic lymph nodes. No bowel obstruction or perforation Vascular/Lymphatic: No acute vascular abnormality. Reproductive:No pathologic findings. Other: No ascites or pneumoperitoneum. Musculoskeletal: No  acute abnormalities. IMPRESSION: 1. Mild distal enteritis. 2. Cholelithiasis and known hepatic metastatic disease. Electronically Signed   By: Tiburcio Pea M.D.   On: 01/07/2023 06:23   DG Chest Port 1 View  Result Date: 01/07/2023 CLINICAL DATA:  Nausea, vomiting, chest pain EXAM: PORTABLE CHEST 1 VIEW COMPARISON:  None Available. FINDINGS: Lung volumes are small. Linear atelectasis within the right lung base. Lungs are otherwise clear. No pneumothorax or pleural effusion. Right internal jugular chest port tip seen at the superior cavoatrial junction. Cardiac size within normal limits. Pulmonary vascularity is normal. Osseous structures are age-appropriate. No acute bone abnormality. IMPRESSION: 1. Pulmonary hypoinflation. Electronically Signed   By: Helyn Numbers M.D.   On: 01/07/2023 02:54    Pending Labs Unresulted Labs (From admission, onward)     Start     Ordered   01/08/23 0500  CBC  Daily,   R      01/07/23 0752   01/08/23 0500  Basic metabolic panel  Daily,   R      01/07/23 0752   01/07/23 1103  Stool culture  Once,   R        01/07/23 1102   01/07/23 0311  Blood Culture (routine x 2)  (Septic presentation on arrival (screening labs, nursing and treatment orders for obvious sepsis))  BLOOD CULTURE X 2,   STAT      01/07/23 0311            Vitals/Pain Today's Vitals   01/07/23 1030 01/07/23 1115 01/07/23 1130 01/07/23 1430  BP: 138/77  (!) 141/87 121/71  Pulse: (!) 103  (!) 117 (!) 121  Resp: 20  16 (!) 21  Temp:  99.7 F (37.6 C)    TempSrc:  Oral    SpO2: 96%  100% (!) 84%  PainSc:        Isolation Precautions No active isolations  Medications Medications  lactated ringers infusion ( Intravenous New Bag/Given 01/07/23 1506)  sodium chloride flush (NS) 0.9 % injection 10-40 mL (has no administration in time range)  Chlorhexidine Gluconate Cloth 2 % PADS 6 each (6 each Topical Not Given 01/07/23 1046)  acetaminophen (TYLENOL) tablet 650 mg (has no administration  in time range)    Or  acetaminophen (TYLENOL) suppository 650 mg (has no administration in time range)  ondansetron (ZOFRAN) injection 4 mg (has no administration in time range)  metoCLOPramide (REGLAN) injection 10 mg (10 mg Intravenous Given 01/07/23 0807)  metroNIDAZOLE (FLAGYL) IVPB 500 mg (has no administration in time range)  enoxaparin (LOVENOX) injection 40 mg (40 mg Subcutaneous Given 01/07/23 1042)  levofloxacin (LEVAQUIN) IVPB 500 mg (0 mg Intravenous Stopped 01/07/23 1241)  HYDROmorphone (DILAUDID) injection 2 mg (2 mg Intravenous Given 01/07/23 1133)  oxyCODONE (Oxy IR/ROXICODONE) immediate release tablet 10 mg (10 mg Oral Given 01/07/23 1344)  dicyclomine (BENTYL) capsule 10 mg (10 mg Oral Given 01/07/23 1133)  carvedilol (COREG) tablet 25 mg (has no administration in time range)  escitalopram (LEXAPRO) tablet 10 mg (10 mg Oral Given 01/07/23 1133)  cyclobenzaprine (  FLEXERIL) tablet 10 mg (has no administration in time range)  gabapentin (NEURONTIN) capsule 300 mg (300 mg Oral Given 01/07/23 1133)  lactated ringers bolus 1,000 mL (0 mLs Intravenous Stopped 01/07/23 0533)    And  lactated ringers bolus 1,000 mL (0 mLs Intravenous Stopped 01/07/23 0415)    And  lactated ringers bolus 500 mL (0 mLs Intravenous Stopped 01/07/23 0502)  ceFEPIme (MAXIPIME) 2 g in sodium chloride 0.9 % 100 mL IVPB (0 g Intravenous Stopped 01/07/23 0416)  metroNIDAZOLE (FLAGYL) IVPB 500 mg (0 mg Intravenous Stopped 01/07/23 0447)  vancomycin (VANCOREADY) IVPB 1750 mg/350 mL (0 mg Intravenous Stopped 01/07/23 0707)  ondansetron (ZOFRAN) injection 4 mg (4 mg Intravenous Given 01/07/23 0359)  fentaNYL (SUBLIMAZE) injection 50 mcg (50 mcg Intravenous Given 01/07/23 0358)  magnesium sulfate IVPB 2 g 50 mL (0 g Intravenous Stopped 01/07/23 0713)  potassium chloride 10 mEq in 100 mL IVPB (0 mEq Intravenous Stopped 01/07/23 1034)    Mobility walks

## 2023-01-07 NOTE — ED Notes (Signed)
Pt ambulated to restroom without assistance and with a steady gait. Pt safely back in bed with rails up x2, call bell within reach and no further requests made.

## 2023-01-07 NOTE — Progress Notes (Signed)
  Carryover admission to the Day Admitter.  I discussed this case with the EDP, Dr. Nicanor Alcon.  Per these discussions:   This is a 47 year old male with colon cancer undergoing chemotherapy who is being admitted with dehydration, hypokalemia, after presenting with 10 days of nausea, vomiting, diarrhea after undergoing his most recent chemotherapy 11 days ago.  His nausea/vomiting has been refractory to outpatient Zofran, Compazine, Phenergan.  He is also noted intermittent fevers as an outpatient over the course of the last 10 days but is afebrile in the ED today.  Renal function appears to be at baseline, but potassium level noted to be 2.8.  He complains of some abdominal discomfort, which is felt to be more musculoskeletal at this time as a consequence of his recurrent episodes of vomiting.  CT abdomen/pelvis is currently pending.  Chest x-ray was clear.  No leukocytosis.   He is receiving IV potassium supplementation.  I have placed an order for Observation to med/tele  for further evaluation management of the above.  I have placed some additional preliminary admit orders via the adult multi-morbid admission order set. I have also ordered add on serum magnesium level, prn IV Zofran.  Is currently receiving continuous lactated Ringer's at 150 cc/h.    Newton Pigg, DO Hospitalist

## 2023-01-07 NOTE — Sepsis Progress Note (Signed)
Following for sepsis monitoring ?

## 2023-01-07 NOTE — Plan of Care (Signed)
Patient is alert and oriented, walked to bed, admitted to 1618 for abdominal pain, nausea, diarrhea, IV antibiotics started, LR running continuously, Pain 9/10 and patient connected to telemetry HR 112, Coreg administered, call bell within reach, bed at lowest position, toiletries provided, will continue to monitor.   Problem: Education: Goal: Knowledge of General Education information will improve Description: Including pain rating scale, medication(s)/side effects and non-pharmacologic comfort measures Outcome: Progressing   Problem: Health Behavior/Discharge Planning: Goal: Ability to manage health-related needs will improve Outcome: Progressing   Problem: Clinical Measurements: Goal: Ability to maintain clinical measurements within normal limits will improve Outcome: Progressing Goal: Will remain free from infection Outcome: Progressing Goal: Diagnostic test results will improve Outcome: Progressing Goal: Respiratory complications will improve Outcome: Progressing Goal: Cardiovascular complication will be avoided Outcome: Progressing   Problem: Activity: Goal: Risk for activity intolerance will decrease Outcome: Progressing   Problem: Nutrition: Goal: Adequate nutrition will be maintained Outcome: Progressing   Problem: Coping: Goal: Level of anxiety will decrease Outcome: Progressing   Problem: Elimination: Goal: Will not experience complications related to bowel motility Outcome: Progressing Goal: Will not experience complications related to urinary retention Outcome: Progressing   Problem: Pain Managment: Goal: General experience of comfort will improve Outcome: Progressing   Problem: Safety: Goal: Ability to remain free from injury will improve Outcome: Progressing   Problem: Skin Integrity: Goal: Risk for impaired skin integrity will decrease Outcome: Progressing

## 2023-01-07 NOTE — ED Provider Notes (Addendum)
[place] [facility] DEPARTMENT AT [facility] Provider Note   CSN: [id number] Arrival date & time: 01/07/23  0158     History  Chief Complaint  Patient presents with   Chest Pain   Abdominal Pain    Joseph Haney is a 47 y.o. male.  The history is provided by the patient and the spouse.  Illness Location:  Body Quality:  Pain and abdomen and chest with emesis.  Has had fevers, nausea and vomiting and diarrhea, post chemo fevers at home to 102 Severity:  Severe Onset quality:  Gradual Duration:  10 days Timing:  Intermittent Progression:  Unchanged Chronicity:  New Context:  Chemo therapy 11 days ago Relieved by:  Nothing Worsened by:  Nothing Ineffective treatments:  Home antiemetic Associated symptoms: abdominal pain, diarrhea, fever, nausea and vomiting   Risk factors:  On chemotherapy for colon cancer      Home Medications Prior to Admission medications   Medication Sig Start Date End Date Taking? Authorizing Provider  carvedilol (COREG) 25 MG tablet Take 1 tablet (25 mg total) by mouth 2 (two) times daily with a meal. 01/15/22  Yes Malachy Mood, MD  cyclobenzaprine (FLEXERIL) 10 MG tablet Take 10-20 mg by mouth 2 (two) times daily as needed for muscle spasms. 06/25/21  Yes [provider]  dexamethasone (DECADRON) 4 MG tablet Take 1 tablet (4 mg total) by mouth daily. Take for 3-5 days after chemo for nausea and fatigue 12/02/22  Yes Pollyann Samples, NP  escitalopram (LEXAPRO) 10 MG tablet Take 10 mg by mouth daily. 04/29/22  Yes [provider]  HYDROcodone-acetaminophen (NORCO/VICODIN) 5-325 MG tablet Take 1 tablet by mouth every 6 (six) hours as needed for moderate pain.   Yes [provider]  ondansetron (ZOFRAN) 8 MG tablet Take 1 tablet (8 mg total) by mouth every 8 (eight) hours as needed for nausea, vomiting or refractory nausea / vomiting. Start on the third day after chemotherapy. 01/02/23  Yes Malachy Mood, MD  prochlorperazine  (COMPAZINE) 10 MG tablet Take 1 tablet (10 mg total) by mouth every 6 (six) hours as needed for nausea or vomiting. 01/02/23  Yes Malachy Mood, MD  diphenoxylate-atropine (LOMOTIL) 2.5-0.025 MG tablet Take 1 tablet by mouth 4 (four) times daily as needed for diarrhea or loose stools. 12/02/22   Pollyann Samples, NP  ferrous sulfate 325 (65 FE) MG tablet Take 1 tablet (325 mg total) by mouth 2 (two) times daily with a meal. 08/01/21   [name], Rebekah Chesterfield, MD  lidocaine-prilocaine (EMLA) cream Apply to affected area once 10/20/22   Malachy Mood, MD  magic mouthwash (nystatin, lidocaine, diphenhydrAMINE, alum & mag hydroxide) suspension Take 5 mLs by mouth 3 (three) times daily as needed for mouth pain. 12/02/22   Pollyann Samples, NP  oxyCODONE (ROXICODONE) 5 MG immediate release tablet Take 1 tablet (5 mg total) by mouth every 6 (six) hours as needed for severe pain. 04/03/22   Darrick Grinder, [name]  polyethylene glycol (MIRALAX / GLYCOLAX) 17 g packet Take 17 g by mouth daily as needed for mild constipation or moderate constipation. 08/01/21   [name], Rebekah Chesterfield, MD  potassium chloride SA (KLOR-CON M) 20 MEQ tablet Take 1 tablet (20 mEq total) by mouth daily for 5 days. 05/07/22 05/12/22  Lanae Boast, MD  promethazine (PHENERGAN) 25 MG tablet Take 1 tablet (25 mg total) by mouth every 6 (six) hours as needed for nausea or vomiting. 01/02/23   Malachy Mood, MD  Allergies    Latex and Morphine and related    Review of Systems   Review of Systems  Constitutional:  Positive for fever.  HENT:  Negative for facial swelling.   Gastrointestinal:  Positive for abdominal pain, diarrhea, nausea and vomiting.  Genitourinary:  Negative for dysuria.  All other systems reviewed and are negative.   Physical Exam Updated Vital Signs BP 120/73 (BP Location: Left Arm)   Pulse (!) 116   Temp 98.3 F (36.8 C) (Oral)   Resp 20   SpO2 96%  Physical Exam Vitals and nursing note reviewed.  Constitutional:      General: He is not in  acute distress.    Appearance: He is well-developed. He is not diaphoretic.  HENT:     Head: Normocephalic and atraumatic.     Nose: Nose normal.  Eyes:     Conjunctiva/sclera: Conjunctivae normal.     Pupils: Pupils are equal, round, and reactive to light.  Cardiovascular:     Rate and Rhythm: Regular rhythm. Tachycardia present.     Pulses: Normal pulses.     Heart sounds: Normal heart sounds.  Pulmonary:     Effort: Pulmonary effort is normal.     Breath sounds: Normal breath sounds. No wheezing or rales.  Abdominal:     General: Bowel sounds are normal.     Palpations: Abdomen is soft.     Tenderness: There is no abdominal tenderness. There is no guarding or rebound.     Hernia: No hernia is present.  Musculoskeletal:        General: Normal range of motion.     Cervical back: Normal range of motion and neck supple.  Skin:    General: Skin is warm and dry.     Capillary Refill: Capillary refill takes less than 2 seconds.  Neurological:     General: No focal deficit present.     Mental Status: He is alert and oriented to person, place, and time.     Deep Tendon Reflexes: Reflexes normal.  Psychiatric:        Mood and Affect: Mood normal.        Behavior: Behavior normal.     ED Results / Procedures / Treatments   Labs (all labs ordered are listed, but only abnormal results are displayed) Results for orders placed or performed during the hospital encounter of 01/07/23  Resp panel by RT-PCR (RSV, Flu A&B, Covid) Anterior Nasal Swab   Specimen: Anterior Nasal Swab  Result Value Ref Range   SARS Coronavirus 2 by RT PCR NEGATIVE NEGATIVE   Influenza A by PCR NEGATIVE NEGATIVE   Influenza B by PCR NEGATIVE NEGATIVE   Resp Syncytial Virus by PCR NEGATIVE NEGATIVE  Basic metabolic panel  Result Value Ref Range   Sodium 131 (L) 135 - 145 mmol/L   Potassium 2.8 (L) 3.5 - 5.1 mmol/L   Chloride 99 98 - 111 mmol/L   CO2 19 (L) 22 - 32 mmol/L   Glucose, Bld 157 (H) 70 - 99  mg/dL   BUN 18 6 - 20 mg/dL   Creatinine, Ser 1.19 0.61 - 1.24 mg/dL   Calcium 8.7 (L) 8.9 - 10.3 mg/dL   GFR, Estimated >14 >78 mL/min   Anion gap 13 5 - 15  CBC  Result Value Ref Range   WBC 9.0 4.0 - 10.5 K/uL   RBC 4.33 4.22 - 5.81 MIL/uL   Hemoglobin 12.2 (L) 13.0 - 17.0 g/dL   HCT 29.5 (  L) 39.0 - 52.0 %   MCV 84.8 80.0 - 100.0 fL   MCH 28.2 26.0 - 34.0 pg   MCHC 33.2 30.0 - 36.0 g/dL   RDW 16.1 09.6 - 04.5 %   Platelets 360 150 - 400 K/uL   nRBC 0.0 0.0 - 0.2 %  Lactic acid, plasma  Result Value Ref Range   Lactic Acid, Venous 1.5 0.5 - 1.9 mmol/L  Protime-INR  Result Value Ref Range   Prothrombin Time 15.0 11.4 - 15.2 seconds   INR 1.2 0.8 - 1.2  APTT  Result Value Ref Range   aPTT 27 24 - 36 seconds  Urinalysis, w/ Reflex to Culture (Infection Suspected) -Urine, Clean Catch  Result Value Ref Range   Specimen Source URINE, CLEAN CATCH    Color, Urine AMBER (A) YELLOW   APPearance CLOUDY (A) CLEAR   Specific Gravity, Urine >1.046 (H) 1.005 - 1.030   pH 5.0 5.0 - 8.0   Glucose, UA NEGATIVE NEGATIVE mg/dL   Hgb urine dipstick NEGATIVE NEGATIVE   Bilirubin Urine SMALL (A) NEGATIVE   Ketones, ur NEGATIVE NEGATIVE mg/dL   Protein, ur >=409 (A) NEGATIVE mg/dL   Nitrite NEGATIVE NEGATIVE   Leukocytes,Ua NEGATIVE NEGATIVE   RBC / HPF 6-10 0 - 5 RBC/hpf   WBC, UA 11-20 0 - 5 WBC/hpf   Bacteria, UA NONE SEEN NONE SEEN   Squamous Epithelial / HPF 0-5 0 - 5 /HPF   Mucus PRESENT    Hyaline Casts, UA PRESENT    Granular Casts, UA PRESENT   Comprehensive metabolic panel  Result Value Ref Range   Sodium 134 (L) 135 - 145 mmol/L   Potassium 2.8 (L) 3.5 - 5.1 mmol/L   Chloride 104 98 - 111 mmol/L   CO2 18 (L) 22 - 32 mmol/L   Glucose, Bld 138 (H) 70 - 99 mg/dL   BUN 17 6 - 20 mg/dL   Creatinine, Ser 8.11 0.61 - 1.24 mg/dL   Calcium 8.8 (L) 8.9 - 10.3 mg/dL   Total Protein 8.2 (H) 6.5 - 8.1 g/dL   Albumin 3.7 3.5 - 5.0 g/dL   AST 27 15 - 41 U/L   ALT 31 0 - 44 U/L    Alkaline Phosphatase 77 38 - 126 U/L   Total Bilirubin 0.8 0.3 - 1.2 mg/dL   GFR, Estimated >91 >47 mL/min   Anion gap 12 5 - 15  Magnesium  Result Value Ref Range   Magnesium 2.1 1.7 - 2.4 mg/dL  Troponin I (High Sensitivity)  Result Value Ref Range   Troponin I (High Sensitivity) 2 <18 ng/L  Troponin I (High Sensitivity)  Result Value Ref Range   Troponin I (High Sensitivity) 2 <18 ng/L   CT Renal Stone Study  Result Date: 01/07/2023 CLINICAL DATA:  Abdominal/flank pain.  Metastatic colon cancer. EXAM: CT ABDOMEN AND PELVIS WITHOUT CONTRAST TECHNIQUE: Multidetector CT imaging of the abdomen and pelvis was performed following the standard protocol without IV contrast. RADIATION DOSE REDUCTION: This exam was performed according to the departmental dose-optimization program which includes automated exposure control, adjustment of the mA and/or kV according to patient size and/or use of iterative reconstruction technique. COMPARISON:  09/19/2022 FINDINGS: Lower chest: Streaky density with volume loss at the right more than left lung base attributed to scarring when compared to prior. Coronary atherosclerosis. Hepatobiliary: No low-density mass/metastasis in the upper central liver measuring 2.5 cm.Cholelithiasis. No biliary dilatation. Pancreas: Unremarkable. Spleen: Unremarkable. Adrenals/Urinary Tract: Negative adrenals. No hydronephrosis or  stone. Unremarkable bladder. Stomach/Bowel: Proximal colectomy with unremarkable enterocolonic anastomosis. Mild low-density thickening of the distal ileum before the anastomosis. Some distal colonic fluid levels are noted and there is mild accentuation of ileocolic lymph nodes. No bowel obstruction or perforation Vascular/Lymphatic: No acute vascular abnormality. Reproductive:No pathologic findings. Other: No ascites or pneumoperitoneum. Musculoskeletal: No acute abnormalities. IMPRESSION: 1. Mild distal enteritis. 2. Cholelithiasis and known hepatic metastatic  disease. Electronically Signed   By: Tiburcio Pea M.D.   On: 01/07/2023 06:23   DG Chest Port 1 View  Result Date: 01/07/2023 CLINICAL DATA:  Nausea, vomiting, chest pain EXAM: PORTABLE CHEST 1 VIEW COMPARISON:  None Available. FINDINGS: Lung volumes are small. Linear atelectasis within the right lung base. Lungs are otherwise clear. No pneumothorax or pleural effusion. Right internal jugular chest port tip seen at the superior cavoatrial junction. Cardiac size within normal limits. Pulmonary vascularity is normal. Osseous structures are age-appropriate. No acute bone abnormality. IMPRESSION: 1. Pulmonary hypoinflation. Electronically Signed   By: Helyn Numbers M.D.   On: 01/07/2023 02:54    EKG EKG Interpretation  Date/Time:  Wednesday Jan 07 2023 03:00:34 EDT Ventricular Rate:  103 PR Interval:  160 QRS Duration: 88 QT Interval:  347 QTC Calculation: 455 R Axis:   41 Text Interpretation: Sinus tachycardia Abnormal R-wave progression, early transition Baseline wander in lead(s) I III aVL Confirmed by Nicanor Alcon, Regla Fitzgibbon ([id number]) on 01/07/2023 4:16:22 AM  Radiology DG Chest Port 1 View  Result Date: 01/07/2023 CLINICAL DATA:  Nausea, vomiting, chest pain EXAM: PORTABLE CHEST 1 VIEW COMPARISON:  None Available. FINDINGS: Lung volumes are small. Linear atelectasis within the right lung base. Lungs are otherwise clear. No pneumothorax or pleural effusion. Right internal jugular chest port tip seen at the superior cavoatrial junction. Cardiac size within normal limits. Pulmonary vascularity is normal. Osseous structures are age-appropriate. No acute bone abnormality. IMPRESSION: 1. Pulmonary hypoinflation. Electronically Signed   By: Helyn Numbers M.D.   On: 01/07/2023 02:54    Procedures Procedures    Medications Ordered in ED Medications  lactated ringers infusion (has no administration in time range)  lactated ringers bolus 1,000 mL (1,000 mLs Intravenous New Bag/Given 01/07/23 0347)    And   lactated ringers bolus 1,000 mL (1,000 mLs Intravenous New Bag/Given 01/07/23 0345)    And  lactated ringers bolus 500 mL (500 mLs Intravenous New Bag/Given 01/07/23 0447)  vancomycin (VANCOREADY) IVPB 1750 mg/350 mL (has no administration in time range)  magnesium sulfate IVPB 2 g 50 mL (has no administration in time range)  potassium chloride 10 mEq in 100 mL IVPB (has no administration in time range)  ceFEPIme (MAXIPIME) 2 g in sodium chloride 0.9 % 100 mL IVPB (2 g Intravenous New Bag/Given 01/07/23 0346)  metroNIDAZOLE (FLAGYL) IVPB 500 mg (500 mg Intravenous New Bag/Given 01/07/23 0347)  ondansetron (ZOFRAN) injection 4 mg (4 mg Intravenous Given 01/07/23 0359)  fentaNYL (SUBLIMAZE) injection 50 mcg (50 mcg Intravenous Given 01/07/23 0358)    ED Course/ Medical Decision Making/ A&P                             Medical Decision Making Patient with nausea vomiting diarrhea and pain and intermitted fevers for 10 days unable to keep down food or drink chemo prior to this starting no change in regimen  Amount and/or Complexity of Data Reviewed Independent Historian: spouse    Details: See above  External Data Reviewed: labs and notes.  Details: Previous notes and labs reviewed  Labs: ordered.    Details: All labs reviewed: negative covid and flu.  Urine negative for uti.  Low sodium 131, low potassium 2.8, normal creatinine.  Negative troponin 2, normal white count 9, hemoglobin low 12.2, normal platelets.  Normal LFTs, lactate 1.5  Radiology: ordered and independent interpretation performed.    Details: Negative cxr by me  ECG/medicine tests: ordered and independent interpretation performed. Decision-making details documented in ED Course.  Risk Prescription drug management. Parenteral controlled substances. Decision regarding hospitalization.   CRITICAL CARE Performed by: Jeriann Sayres K [place] Welliver-Rasch Total critical care time: 60  minutes Critical care time was exclusive of separately  billable procedures and treating other patients. Critical care was necessary to treat or prevent imminent or life-threatening deterioration. Critical care was time spent personally by me on the following activities: development of treatment plan with patient and/or surrogate as well as nursing, discussions with consultants, evaluation of patient's response to treatment, examination of patient, obtaining history from patient or surrogate, ordering and performing treatments and interventions, ordering and review of laboratory studies, ordering and review of radiographic studies, pulse oximetry and re-evaluation of patient's condition. Sepsis bundle   Final Clinical Impression(s) / ED Diagnoses Final diagnoses:  Sepsis, due to unspecified organism, unspecified whether acute organ dysfunction present [facility])  Dehydration   The patient appears reasonably stabilized for admission considering the current resources, flow, and capabilities available in the ED at this time, and I doubt any other [facility] requiring further screening and/or treatment in the ED prior to admission.  Rx / DC Orders ED Discharge Orders     None                Averly Ericson, MD 01/07/23 [phone]

## 2023-01-07 NOTE — H&P (Signed)
History and Physical  Khyair Brookbank ZOX:[id number] DOB: 05-24-1976 DOA: 01/07/2023  PCP: Frederic Jericho, [name]   Chief Complaint: abd pain, vomiting   HPI: Fergus Dettling is a 47 y.o. male with medical history significant for hypertension, recurrent colon cancer currently on chemotherapy under the care of Dr. Mosetta Putt to be admitted to the hospital with 10 days of nausea, abdominal pain, diarrhea after his most recent chemotherapy 11 days ago.  Has been refractory to outpatient medications.  Lab work is relatively unremarkable except for potassium level 2.8.  Patient was given IV fluids, IV potassium replacement and empiric IV antibiotics.  CT abdomen pelvis shows evidence of enteritis.  Currently he has some mild nausea, has not vomited since ER presentation, had 1 watery bowel movement since ER presentation.  Review of Systems: Please see HPI for pertinent positives and negatives. A complete 10 system review of systems are otherwise negative.  Past Medical History:  Diagnosis Date   Alcohol abuse    Cancer (HCC)    GERD (gastroesophageal reflux disease)    Seizure [facility])    Past Surgical History:  Procedure Laterality Date   BIOPSY  07/22/2021   Procedure: BIOPSY;  Surgeon: Kathi Der, MD;  Location: [facility];  Service: Gastroenterology;;   COLON SURGERY     COLONOSCOPY WITH PROPOFOL N/A 07/22/2021   Procedure: COLONOSCOPY WITH PROPOFOL;  Surgeon: Kathi Der, MD;  Location: [facility];  Service: Gastroenterology;  Laterality: N/A;   IR IMAGING GUIDED PORT INSERTION  10/17/2022   NO PAST SURGERIES     SUBMUCOSAL TATTOO INJECTION  07/22/2021   Procedure: SUBMUCOSAL TATTOO INJECTION;  Surgeon: Kathi Der, MD;  Location: [facility];  Service: Gastroenterology;;    Social History:  reports that he has never smoked. He has never used smokeless tobacco. He reports that he does not currently use alcohol. He reports that he does not use drugs.   Allergies   Allergen Reactions   Latex Swelling and Other (See Comments)    Pain and swelling of penis after urinary catheter placed last surgery.   Morphine And Related Itching    Family History  Problem Relation Age of Onset   Coronary artery disease Mother    Coronary artery disease Father    Heart attack Father 48   Cancer Cousin 19       prostate     Prior to Admission medications   Medication Sig Start Date End Date Taking? Authorizing Provider  carvedilol (COREG) 25 MG tablet Take 1 tablet (25 mg total) by mouth 2 (two) times daily with a meal. 01/15/22  Yes Malachy Mood, MD  cyclobenzaprine (FLEXERIL) 10 MG tablet Take 10-20 mg by mouth 2 (two) times daily as needed for muscle spasms. 06/25/21  Yes [provider]  dexamethasone (DECADRON) 4 MG tablet Take 1 tablet (4 mg total) by mouth daily. Take for 3-5 days after chemo for nausea and fatigue 12/02/22  Yes Pollyann Samples, NP  escitalopram (LEXAPRO) 10 MG tablet Take 10 mg by mouth daily. 04/29/22  Yes [provider]  gabapentin (NEURONTIN) 300 MG capsule Take 300 mg by mouth 3 (three) times daily.   Yes [provider]  HYDROcodone-acetaminophen (NORCO/VICODIN) 5-325 MG tablet Take 1 tablet by mouth every 6 (six) hours as needed for moderate pain.   Yes [provider]  lidocaine-prilocaine (EMLA) cream Apply to affected area once 10/20/22  Yes Malachy Mood, MD  ondansetron (ZOFRAN) 8 MG tablet Take 1 tablet (8 mg total) by  mouth every 8 (eight) hours as needed for nausea, vomiting or refractory nausea / vomiting. Start on the third day after chemotherapy. 01/02/23  Yes Malachy Mood, MD  Oxycodone HCl 10 MG TABS Take 10 mg by mouth every 6 (six) hours as needed.   Yes [provider]  prochlorperazine (COMPAZINE) 10 MG tablet Take 1 tablet (10 mg total) by mouth every 6 (six) hours as needed for nausea or vomiting. 01/02/23  Yes Malachy Mood, MD  promethazine (PHENERGAN) 25 MG tablet Take 1 tablet (25 mg total)  by mouth every 6 (six) hours as needed for nausea or vomiting. 01/02/23  Yes Malachy Mood, MD  diphenoxylate-atropine (LOMOTIL) 2.5-0.025 MG tablet Take 1 tablet by mouth 4 (four) times daily as needed for diarrhea or loose stools. Patient not taking: Reported on 01/07/2023 12/02/22   Pollyann Samples, NP  ferrous sulfate 325 (65 FE) MG tablet Take 1 tablet (325 mg total) by mouth 2 (two) times daily with a meal. Patient not taking: Reported on 01/07/2023 08/01/21   Joycelyn Das, MD  magic mouthwash (nystatin, lidocaine, diphenhydrAMINE, alum & mag hydroxide) suspension Take 5 mLs by mouth 3 (three) times daily as needed for mouth pain. Patient not taking: Reported on 01/07/2023 12/02/22   Pollyann Samples, NP  oxyCODONE (ROXICODONE) 5 MG immediate release tablet Take 1 tablet (5 mg total) by mouth every 6 (six) hours as needed for severe pain. Patient not taking: Reported on 01/07/2023 04/03/22   Barrie Dunker B, [name]  polyethylene glycol (MIRALAX / GLYCOLAX) 17 g packet Take 17 g by mouth daily as needed for mild constipation or moderate constipation. Patient not taking: Reported on 01/07/2023 08/01/21   [name], Rebekah Chesterfield, MD  potassium chloride SA (KLOR-CON M) 20 MEQ tablet Take 1 tablet (20 mEq total) by mouth daily for 5 days. Patient not taking: Reported on 01/07/2023 05/07/22 05/12/22  Lanae Boast, MD    Physical Exam: BP (!) 148/81   Pulse (!) 101   Temp 98.1 F (36.7 C) (Oral)   Resp 17   SpO2 99%   General:  Alert, oriented, calm, in no acute distress, looks slightly dehydrated, not chronically ill appearing, and not acutely in any distress either. Eyes: EOMI, clear conjuctivae, white sclerea Neck: supple, no masses, trachea mildline  Cardiovascular: RRR, no murmurs or rubs, no peripheral edema  Respiratory: clear to auscultation bilaterally, no wheezes, no crackles  Abdomen: soft, tender in the epigastrium, nondistended, normal bowel tones heard  Skin: dry, no rashes  Musculoskeletal: no joint  effusions, normal range of motion  Psychiatric: appropriate affect, normal speech  Neurologic: extraocular muscles intact, clear speech, moving all extremities with intact sensorium          Labs on Admission:  Basic Metabolic Panel: Recent Labs  Lab 01/07/23 0253 01/07/23 0355  NA 131* 134*  K 2.8* 2.8*  CL 99 104  CO2 19* 18*  GLUCOSE 157* 138*  BUN 18 17  CREATININE 1.17 1.07  CALCIUM 8.7* 8.8*  MG  --  2.1   Liver Function Tests: Recent Labs  Lab 01/07/23 0355  AST 27  ALT 31  ALKPHOS 77  BILITOT 0.8  PROT 8.2*  ALBUMIN 3.7   No results for input(s): "LIPASE", "AMYLASE" in the last 168 hours. No results for input(s): "AMMONIA" in the last 168 hours. CBC: Recent Labs  Lab 01/07/23 0253  WBC 9.0  HGB 12.2*  HCT 36.7*  MCV 84.8  PLT 360   Cardiac Enzymes: No results  for input(s): "CKTOTAL", "CKMB", "CKMBINDEX", "TROPONINI" in the last 168 hours.  BNP (last 3 results) No results for input(s): "BNP" in the last 8760 hours.  ProBNP (last 3 results) No results for input(s): "PROBNP" in the last 8760 hours.  CBG: No results for input(s): "GLUCAP" in the last 168 hours.  Radiological Exams on Admission: CT Renal Stone Study  Result Date: 01/07/2023 CLINICAL DATA:  Abdominal/flank pain.  Metastatic colon cancer. EXAM: CT ABDOMEN AND PELVIS WITHOUT CONTRAST TECHNIQUE: Multidetector CT imaging of the abdomen and pelvis was performed following the standard protocol without IV contrast. RADIATION DOSE REDUCTION: This exam was performed according to the departmental dose-optimization program which includes automated exposure control, adjustment of the mA and/or kV according to patient size and/or use of iterative reconstruction technique. COMPARISON:  09/19/2022 FINDINGS: Lower chest: Streaky density with volume loss at the right more than left lung base attributed to scarring when compared to prior. Coronary atherosclerosis. Hepatobiliary: No low-density  mass/metastasis in the upper central liver measuring 2.5 cm.Cholelithiasis. No biliary dilatation. Pancreas: Unremarkable. Spleen: Unremarkable. Adrenals/Urinary Tract: Negative adrenals. No hydronephrosis or stone. Unremarkable bladder. Stomach/Bowel: Proximal colectomy with unremarkable enterocolonic anastomosis. Mild low-density thickening of the distal ileum before the anastomosis. Some distal colonic fluid levels are noted and there is mild accentuation of ileocolic lymph nodes. No bowel obstruction or perforation Vascular/Lymphatic: No acute vascular abnormality. Reproductive:No pathologic findings. Other: No ascites or pneumoperitoneum. Musculoskeletal: No acute abnormalities. IMPRESSION: 1. Mild distal enteritis. 2. Cholelithiasis and known hepatic metastatic disease. Electronically Signed   By: Tiburcio Pea M.D.   On: 01/07/2023 06:23   DG Chest Port 1 View  Result Date: 01/07/2023 CLINICAL DATA:  Nausea, vomiting, chest pain EXAM: PORTABLE CHEST 1 VIEW COMPARISON:  None Available. FINDINGS: Lung volumes are small. Linear atelectasis within the right lung base. Lungs are otherwise clear. No pneumothorax or pleural effusion. Right internal jugular chest port tip seen at the superior cavoatrial junction. Cardiac size within normal limits. Pulmonary vascularity is normal. Osseous structures are age-appropriate. No acute bone abnormality. IMPRESSION: 1. Pulmonary hypoinflation. Electronically Signed   By: Helyn Numbers M.D.   On: 01/07/2023 02:54    Assessment/Plan This is an unfortunate 47 year old gentleman with a history of recurrent colon cancer currently on chemotherapy, admitted to the hospital with 10 days of abdominal pain, vomiting, diarrhea found to have enteritis.  Not septic.   Enteritis -Observation admission -Continue empiric IV levofloxacin and IV Flagyl -Continue supportive care with IV fluids, pain and nausea medications -He may have a regular diet as tolerated     Hypokalemia-due to vomiting and diarrhea, recheck potassium level now, note normal magnesium level    Cancer of right colon (HCC)-followed by Dr. Mosetta Putt, last received neoadjuvant FOLFIRI cycle 6 on 4/29, which was the last scheduled chemotherapy  DVT prophylaxis: Lovenox     Code Status: Full Code  Consults called: Dr. Mosetta Putt added to inpatient treatment team.  Admission status: Observation  Time spent: 46 minutes  Shivon Hackel Sharlette Dense MD [facility] Pager [phone]  If 7PM-7AM, please contact night-coverage [email or web address] Password [facility]  01/07/2023, 9:23 AM

## 2023-01-07 NOTE — ED Notes (Signed)
Assumed care of patient. Patient resting comfortably in bed with no signs of acute distress noted. Magnesium and first bag of potassium has been completed at this time. Pt complaining of nausea and pain. Informed patient I would reach out to MD for orders. No other complaints at this time.

## 2023-01-08 DIAGNOSIS — R000 Tachycardia, unspecified: Secondary | ICD-10-CM | POA: Diagnosis not present

## 2023-01-08 DIAGNOSIS — E860 Dehydration: Secondary | ICD-10-CM | POA: Diagnosis not present

## 2023-01-08 DIAGNOSIS — E876 Hypokalemia: Secondary | ICD-10-CM

## 2023-01-08 DIAGNOSIS — C182 Malignant neoplasm of ascending colon: Secondary | ICD-10-CM

## 2023-01-08 DIAGNOSIS — A419 Sepsis, unspecified organism: Secondary | ICD-10-CM | POA: Diagnosis not present

## 2023-01-08 DIAGNOSIS — K529 Noninfective gastroenteritis and colitis, unspecified: Secondary | ICD-10-CM | POA: Diagnosis not present

## 2023-01-08 DIAGNOSIS — R079 Chest pain, unspecified: Secondary | ICD-10-CM | POA: Diagnosis not present

## 2023-01-08 DIAGNOSIS — Z8249 Family history of ischemic heart disease and other diseases of the circulatory system: Secondary | ICD-10-CM | POA: Diagnosis not present

## 2023-01-08 DIAGNOSIS — R112 Nausea with vomiting, unspecified: Secondary | ICD-10-CM | POA: Diagnosis not present

## 2023-01-08 DIAGNOSIS — Z79899 Other long term (current) drug therapy: Secondary | ICD-10-CM | POA: Diagnosis not present

## 2023-01-08 DIAGNOSIS — T451X5A Adverse effect of antineoplastic and immunosuppressive drugs, initial encounter: Secondary | ICD-10-CM | POA: Diagnosis not present

## 2023-01-08 DIAGNOSIS — Z885 Allergy status to narcotic agent status: Secondary | ICD-10-CM | POA: Diagnosis not present

## 2023-01-08 DIAGNOSIS — J9811 Atelectasis: Secondary | ICD-10-CM | POA: Diagnosis not present

## 2023-01-08 DIAGNOSIS — A045 Campylobacter enteritis: Secondary | ICD-10-CM | POA: Diagnosis not present

## 2023-01-08 DIAGNOSIS — Z1152 Encounter for screening for COVID-19: Secondary | ICD-10-CM | POA: Diagnosis not present

## 2023-01-08 DIAGNOSIS — Z91040 Latex allergy status: Secondary | ICD-10-CM | POA: Diagnosis not present

## 2023-01-08 DIAGNOSIS — K219 Gastro-esophageal reflux disease without esophagitis: Secondary | ICD-10-CM | POA: Diagnosis not present

## 2023-01-08 DIAGNOSIS — I10 Essential (primary) hypertension: Secondary | ICD-10-CM | POA: Diagnosis not present

## 2023-01-08 LAB — BASIC METABOLIC PANEL
Anion gap: 7 (ref 5–15)
BUN: 6 mg/dL (ref 6–20)
CO2: 24 mmol/L (ref 22–32)
Calcium: 7.8 mg/dL — ABNORMAL LOW (ref 8.9–10.3)
Chloride: 104 mmol/L (ref 98–111)
Creatinine, Ser: 0.81 mg/dL (ref 0.61–1.24)
GFR, Estimated: >60 mL/min (ref >60)
Glucose, Bld: 107 mg/dL — ABNORMAL HIGH (ref 70–99)
Potassium: 3 mmol/L — ABNORMAL LOW (ref 3.5–5.1)
Sodium: 135 mmol/L (ref 135–145)

## 2023-01-08 LAB — C DIFFICILE QUICK SCREEN W PCR REFLEX
C Diff antigen: NEGATIVE
C Diff interpretation: NOT DETECTED
C Diff toxin: NEGATIVE

## 2023-01-08 LAB — CBC
HCT: 41.1 % (ref 39.0–52.0)
Hemoglobin: 13.4 g/dL (ref 13.0–17.0)
MCH: 28.1 pg (ref 26.0–34.0)
MCHC: 32.6 g/dL (ref 30.0–36.0)
MCV: 86.2 fL (ref 80.0–100.0)
Platelets: 198 10*3/uL (ref 150–400)
RBC: 4.77 MIL/uL (ref 4.22–5.81)
RDW: 14 % (ref 11.5–15.5)
WBC: 5.8 10*3/uL (ref 4.0–10.5)
nRBC: 0 % (ref 0.0–0.2)

## 2023-01-08 LAB — STOOL CULTURE

## 2023-01-08 LAB — URINE CULTURE

## 2023-01-08 MED ORDER — POTASSIUM CHLORIDE CRYS ER 20 MEQ PO TBCR
40.0000 meq | EXTENDED_RELEASE_TABLET | ORAL | Status: AC
  Administered 2023-01-08 (×2): 40 meq via ORAL
  Filled 2023-01-08 (×2): qty 2

## 2023-01-08 NOTE — Assessment & Plan Note (Signed)
Likely secondary to GI losses.  Magnesium normal. -Replete potassium and monitor

## 2023-01-08 NOTE — Progress Notes (Signed)
  Progress Note   Patient: Joseph Haney ZOX:[id number] DOB: 05-27-1976 DOA: 01/07/2023     0 DOS: the patient was seen and examined on 01/08/2023   Brief hospital course: Taken from H&P.   Laquarius Stemarie is a 47 y.o. male with medical history significant for hypertension, recurrent colon cancer currently on chemotherapy under the care of Dr. Mosetta Putt to be admitted to the hospital with 10 days of nausea, abdominal pain, diarrhea after his most recent chemotherapy 11 days ago.  Has been refractory to outpatient medications.  Lab work is relatively unremarkable except for potassium level 2.8. Patient was given IV fluids, IV potassium replacement and empiric IV antibiotics. CT abdomen pelvis shows evidence of enteritis.    Patient was started on empiric antibiotics with IV levofloxacin, Flagyl and IV fluid.  5/9: Vital seems stable.  Potassium at 3.  Magnesium was normal which was checked yesterday.  Had 7-8 loose bowel movements overnight, ordered C. difficile and GI pathogen panel.     Assessment and Plan: * Enteritis Patient with history of recurrent colon cancer, currently on chemotherapy.  Diarrhea can be related to chemo.  Imaging concerning for enteritis. -Checking C. difficile and GI pathogen panel -Continue with IV hydration -Continue with empiric antibiotics   Hypokalemia Likely secondary to GI losses.  Magnesium normal. -Replete potassium and monitor  Cancer of right colon [facility]) History of recurrent colon cancer, currently undergoing chemotherapy by Dr. Mosetta Putt. last received neoadjuvant FOLFIRI cycle 6 on 4/29  -Continue outpatient management   Subjective: Patient continued to have diarrhea.  Some crampy abdominal pain.  No nausea or vomiting  Physical Exam: Vitals:   01/07/23 2023 01/08/23 0003 01/08/23 0353 01/08/23 1353  BP: 127/75 132/74 (!) 139/98 120/75  Pulse: 99 99 82 86  Resp: 16 16 16 18   Temp: 99.6 F (37.6 C) 98.2 F (36.8 C)  98 F (36.7 C)  TempSrc:  Oral Oral  Oral  SpO2: 100% 98% 100% 98%   General.  Well-developed gentleman, in no acute distress. Pulmonary.  Lungs clear bilaterally, normal respiratory effort. CV.  Regular rate and rhythm, no JVD, rub or murmur. Abdomen.  Soft, nontender, nondistended, BS positive. CNS.  Alert and oriented .  No focal neurologic deficit. Extremities.  No edema, no cyanosis, pulses intact and symmetrical. Psychiatry.  Judgment and insight appears normal.   Data Reviewed: Prior data reviewed  Family Communication: Discussed with patient  Disposition: Status is: Observation The patient will require care spanning > 2 midnights and should be moved to inpatient because: Severity of this  Planned Discharge Destination: Home  DVT prophylaxis.  Lovenox Time spent: 40 minutes  This record has been created using Conservation officer, historic buildings. Errors have been sought and corrected,but may not always be located. Such creation errors do not reflect on the standard of care.   Author: Arnetha Courser, MD 01/08/2023 3:09 PM  For on call review [email or web address].

## 2023-01-08 NOTE — Assessment & Plan Note (Signed)
Patient with history of recurrent colon cancer, currently on chemotherapy.  Diarrhea can be related to chemo.  Imaging concerning for enteritis. -Checking C. difficile and GI pathogen panel -Continue with IV hydration -Continue with empiric antibiotics

## 2023-01-08 NOTE — Hospital Course (Signed)
Taken from H&P.   Joseph Haney is a 47 y.o. male with medical history significant for hypertension, recurrent colon cancer currently on chemotherapy under the care of Dr. Mosetta Putt to be admitted to the hospital with 10 days of nausea, abdominal pain, diarrhea after his most recent chemotherapy 11 days ago.  Has been refractory to outpatient medications.  Lab work is relatively unremarkable except for potassium level 2.8. Patient was given IV fluids, IV potassium replacement and empiric IV antibiotics. CT abdomen pelvis shows evidence of enteritis.    Patient was started on empiric antibiotics with IV levofloxacin, Flagyl and IV fluid.  5/9: Vital seems stable.  Potassium at 3.  Magnesium was normal which was checked yesterday.  Had 7-8 loose bowel movements overnight, ordered C. difficile and GI pathogen panel.

## 2023-01-08 NOTE — Assessment & Plan Note (Signed)
History of recurrent colon cancer, currently undergoing chemotherapy by Dr. Mosetta Putt. last received neoadjuvant FOLFIRI cycle 6 on 4/29  -Continue outpatient management

## 2023-01-09 DIAGNOSIS — K529 Noninfective gastroenteritis and colitis, unspecified: Secondary | ICD-10-CM | POA: Diagnosis not present

## 2023-01-09 LAB — BASIC METABOLIC PANEL
Anion gap: 8 (ref 5–15)
BUN: 6 mg/dL (ref 6–20)
CO2: 26 mmol/L (ref 22–32)
Calcium: 8.3 mg/dL — ABNORMAL LOW (ref 8.9–10.3)
Chloride: 104 mmol/L (ref 98–111)
Creatinine, Ser: 0.8 mg/dL (ref 0.61–1.24)
GFR, Estimated: >60 mL/min (ref >60)
Glucose, Bld: 106 mg/dL — ABNORMAL HIGH (ref 70–99)
Potassium: 3.2 mmol/L — ABNORMAL LOW (ref 3.5–5.1)
Sodium: 138 mmol/L (ref 135–145)

## 2023-01-09 LAB — GASTROINTESTINAL PANEL BY PCR, STOOL (REPLACES STOOL CULTURE)

## 2023-01-09 LAB — CBC
HCT: 27.6 % — ABNORMAL LOW (ref 39.0–52.0)
Hemoglobin: 9 g/dL — ABNORMAL LOW (ref 13.0–17.0)
MCH: 28.3 pg (ref 26.0–34.0)
MCHC: 32.6 g/dL (ref 30.0–36.0)
MCV: 86.8 fL (ref 80.0–100.0)
Platelets: 250 10*3/uL (ref 150–400)
RBC: 3.18 MIL/uL — ABNORMAL LOW (ref 4.22–5.81)
RDW: 14 % (ref 11.5–15.5)
WBC: 7.3 10*3/uL (ref 4.0–10.5)
nRBC: 0 % (ref 0.0–0.2)

## 2023-01-09 LAB — CULTURE, BLOOD (ROUTINE X 2)

## 2023-01-09 MED ORDER — POTASSIUM CHLORIDE 10 MEQ/100ML IV SOLN
10.0000 meq | INTRAVENOUS | Status: AC
  Administered 2023-01-09 (×6): 10 meq via INTRAVENOUS
  Filled 2023-01-09 (×6): qty 100

## 2023-01-09 MED ORDER — SENNOSIDES-DOCUSATE SODIUM 8.6-50 MG PO TABS: 1.0000 | ORAL_TABLET | Freq: Every evening | ORAL | Status: DC | PRN

## 2023-01-09 MED ORDER — METOPROLOL TARTRATE 5 MG/5ML IV SOLN: 5.0000 mg | INTRAVENOUS | Status: DC | PRN

## 2023-01-09 MED ORDER — GUAIFENESIN 100 MG/5ML PO LIQD: 5.0000 mL | ORAL | Status: DC | PRN

## 2023-01-09 MED ORDER — HYDRALAZINE HCL 20 MG/ML IJ SOLN: 10.0000 mg | INTRAMUSCULAR | Status: DC | PRN

## 2023-01-09 MED ORDER — SODIUM CHLORIDE 0.9 % IV SOLN: INTRAVENOUS | Status: AC

## 2023-01-09 MED ORDER — AZITHROMYCIN 250 MG PO TABS
500.0000 mg | ORAL_TABLET | Freq: Every day | ORAL | Status: DC
  Administered 2023-01-09 – 2023-01-10 (×2): 500 mg via ORAL
  Filled 2023-01-09 (×2): qty 2

## 2023-01-09 NOTE — Progress Notes (Signed)
PROGRESS NOTE    Joseph Haney  PPI:[id number] DOB: 1976/04/08 DOA: 01/07/2023 PCP: Frederic Jericho, [name]   Brief Narrative:  47 year old with history of hypertension, recurrent colon cancer on chemotherapy follows with Dr. Mosetta Putt comes to the hospital complains of 10 days of nausea, abdominal pain and diarrhea after his chemotherapy.  Upon admission CT scan showed enteritis.  Started on empiric IV Levaquin, Flagyl and fluids.  GI panel was positive for Campylobacter therefore started on azithromycin   Assessment & Plan:  Principal Problem:   Enteritis Active Problems:   Hypokalemia   Cancer of right colon (HCC)   Dehydration   Campylobacter enteritis GI panel was positive for Campylobacter enteritis, C. difficile is negative.  Will start patient on azithromycin 500 mg for total of 3 days.  Supportive care, IV fluids, antiemetics.  Hypokalemia Replete electrolytes as needed   Cancer of right colon [facility]) History of recurrent colon cancer, currently undergoing chemotherapy by Dr. Mosetta Putt. last received neoadjuvant FOLFIRI cycle 6 on 4/29  -Continue outpatient management    DVT prophylaxis: Lovenox Code Status: Full code Family Communication:   Status is: Inpatient Continue hospital stay for management of electrolytes and Campylobacter gastroenteritis.       Diet Orders (From admission, onward)     Start     Ordered   01/07/23 1146  Diet regular Room service appropriate? Yes; Fluid consistency: Thin  Diet effective now       Comments: No pork or beef products. Only chicken or fish.  Question Answer Comment  Room service appropriate? Yes   Fluid consistency: Thin      01/07/23 1146            Subjective: Patient had about 9 episodes of diarrhea in last 24 hours.  His last stool was slightly more formed   Examination:  General exam: Appears calm and comfortable  Respiratory system: Clear to auscultation. Respiratory effort normal. Cardiovascular system:  S1 & S2 heard, RRR. No JVD, murmurs, rubs, gallops or clicks. No pedal edema. Gastrointestinal system: Abdomen is nondistended, soft and nontender. No organomegaly or masses felt. Normal bowel sounds heard. Central nervous system: Alert and oriented. No focal neurological deficits. Extremities: Symmetric 5 x 5 power. Skin: No rashes, lesions or ulcers Psychiatry: Judgement and insight appear normal. Mood & affect appropriate.  Objective: Vitals:   01/08/23 0353 01/08/23 1353 01/08/23 2121 01/09/23 0452  BP: (!) 139/98 120/75 124/77 (!) 140/97  Pulse: 82 86 90 85  Resp: 16 18 18 18   Temp:  98 F (36.7 C) 97.8 F (36.6 C) 98.2 F (36.8 C)  TempSrc:  Oral Oral Oral  SpO2: 100% 98% 100% 100%    Intake/Output Summary (Last 24 hours) at 01/09/2023 0857 Last data filed at 01/08/2023 1000 Gross per 24 hour  Intake 240 ml  Output --  Net 240 ml   There were no vitals filed for this visit.  Scheduled Meds:  azithromycin  500 mg Oral Daily   carvedilol  25 mg Oral BID WC   Chlorhexidine Gluconate Cloth  6 each Topical Daily   enoxaparin (LOVENOX) injection  40 mg Subcutaneous Q24H   escitalopram  10 mg Oral Daily   gabapentin  300 mg Oral TID   Continuous Infusions:  levofloxacin (LEVAQUIN) IV 500 mg (01/08/23 1145)   metronidazole 500 mg (01/09/23 0441)    Nutritional status     There is no height or weight on file to calculate BMI.  Data Reviewed:   CBC: Recent  Labs  Lab 01/07/23 0253 01/08/23 0500 01/09/23 0807  WBC 9.0 5.8 7.3  HGB 12.2* 13.4 9.0*  HCT 36.7* 41.1 27.6*  MCV 84.8 86.2 86.8  PLT 360 198 250   Basic Metabolic Panel: Recent Labs  Lab 01/07/23 0253 01/07/23 0355 01/07/23 1044 01/08/23 0500 01/09/23 0705  NA 131* 134*  --  135 138  K 2.8* 2.8* 3.4* 3.0* 3.2*  CL 99 104  --  104 104  CO2 19* 18*  --  24 26  GLUCOSE 157* 138*  --  107* 106*  BUN 18 17  --  6 6  CREATININE 1.17 1.07  --  0.81 0.80  CALCIUM 8.7* 8.8*  --  7.8* 8.3*  MG  --   2.1  --   --   --    GFR: Estimated Creatinine Clearance: 122.8 mL/min (by C-G formula based on SCr of 0.8 mg/dL). Liver Function Tests: Recent Labs  Lab 01/07/23 0355  AST 27  ALT 31  ALKPHOS 77  BILITOT 0.8  PROT 8.2*  ALBUMIN 3.7   No results for input(s): "LIPASE", "AMYLASE" in the last 168 hours. No results for input(s): "AMMONIA" in the last 168 hours. Coagulation Profile: Recent Labs  Lab 01/07/23 0325  INR 1.2   Cardiac Enzymes: No results for input(s): "CKTOTAL", "CKMB", "CKMBINDEX", "TROPONINI" in the last 168 hours. BNP (last 3 results) No results for input(s): "PROBNP" in the last 8760 hours. HbA1C: No results for input(s): "HGBA1C" in the last 72 hours. CBG: No results for input(s): "GLUCAP" in the last 168 hours. Lipid Profile: No results for input(s): "CHOL", "HDL", "LDLCALC", "TRIG", "CHOLHDL", "LDLDIRECT" in the last 72 hours. Thyroid Function Tests: No results for input(s): "TSH", "T4TOTAL", "FREET4", "T3FREE", "THYROIDAB" in the last 72 hours. Anemia Panel: No results for input(s): "VITAMINB12", "FOLATE", "FERRITIN", "TIBC", "IRON", "RETICCTPCT" in the last 72 hours. Sepsis Labs: Recent Labs  Lab 01/07/23 0325  LATICACIDVEN 1.5    Recent Results (from the past 240 hour(s))  Resp panel by RT-PCR (RSV, Flu A&B, Covid) Anterior Nasal Swab     Status: None   Collection Time: 01/07/23  3:11 AM   Specimen: Anterior Nasal Swab  Result Value Ref Range Status   SARS Coronavirus 2 by RT PCR NEGATIVE NEGATIVE Final    Comment: (NOTE) SARS-CoV-2 target nucleic acids are NOT DETECTED.  The SARS-CoV-2 RNA is generally detectable in upper respiratory specimens during the acute phase of infection. The lowest concentration of SARS-CoV-2 viral copies this assay can detect is 138 copies/mL. A negative result does not preclude SARS-Cov-2 infection and should not be used as the sole basis for treatment or other patient management decisions. A negative result  may occur with  improper specimen collection/handling, submission of specimen other than nasopharyngeal swab, presence of viral mutation(s) within the areas targeted by this assay, and inadequate number of viral copies(<138 copies/mL). A negative result must be combined with clinical observations, patient history, and epidemiological information. The expected result is Negative.  Fact Sheet for Patients:  BloggerCourse.com  Fact Sheet for Healthcare Providers:  SeriousBroker.it  This test is no t yet approved or cleared by the [place] FDA and  has been authorized for detection and/or diagnosis of SARS-CoV-2 by FDA under an Emergency Use Authorization (EUA). This EUA will remain  in effect (meaning this test can be used) for the duration of the COVID-19 declaration under Section 564(b)(1) of the Act, 21 U.S.C.section 360bbb-3(b)(1), unless the authorization is terminated  or revoked sooner.  Influenza A by PCR NEGATIVE NEGATIVE Final   Influenza B by PCR NEGATIVE NEGATIVE Final    Comment: (NOTE) The Xpert Xpress SARS-CoV-2/FLU/RSV plus assay is intended as an aid in the diagnosis of influenza from Nasopharyngeal swab specimens and should not be used as a sole basis for treatment. Nasal washings and aspirates are unacceptable for Xpert Xpress SARS-CoV-2/FLU/RSV testing.  Fact Sheet for Patients: BloggerCourse.com  Fact Sheet for Healthcare Providers: SeriousBroker.it  This test is not yet approved or cleared by the [place] FDA and has been authorized for detection and/or diagnosis of SARS-CoV-2 by FDA under an Emergency Use Authorization (EUA). This EUA will remain in effect (meaning this test can be used) for the duration of the COVID-19 declaration under Section 564(b)(1) of the Act, 21 U.S.C. section 360bbb-3(b)(1), unless the authorization is terminated  or revoked.     Resp Syncytial Virus by PCR NEGATIVE NEGATIVE Final    Comment: (NOTE) Fact Sheet for Patients: BloggerCourse.com  Fact Sheet for Healthcare Providers: SeriousBroker.it  This test is not yet approved or cleared by the [place] FDA and has been authorized for detection and/or diagnosis of SARS-CoV-2 by FDA under an Emergency Use Authorization (EUA). This EUA will remain in effect (meaning this test can be used) for the duration of the COVID-19 declaration under Section 564(b)(1) of the Act, 21 U.S.C. section 360bbb-3(b)(1), unless the authorization is terminated or revoked.  Performed at [facility], [address], [place], [place] [address]   Blood Culture (routine x 2)     Status: None (Preliminary result)   Collection Time: 01/07/23  3:25 AM   Specimen: BLOOD  Result Value Ref Range Status   Specimen Description   Final    BLOOD SITE NOT SPECIFIED Performed at [facility], [address] [place], [place] [address]    Special Requests   Final    BOTTLES DRAWN AEROBIC AND ANAEROBIC Blood Culture results may not be optimal due to an inadequate volume of blood received in culture bottles Performed at [facility], [address] [place], [place] [address]    Culture   Final    NO GROWTH 2 DAYS Performed at [facility], [address], [place], [place] [address]    Report Status PENDING  Incomplete  Blood Culture (routine x 2)     Status: None (Preliminary result)   Collection Time: 01/07/23  6:20 AM   Specimen: BLOOD  Result Value Ref Range Status   Specimen Description   Final    BLOOD SITE NOT SPECIFIED Performed at [facility], [address] [place], [place] [address]    Special Requests   Final    BOTTLES DRAWN AEROBIC AND ANAEROBIC Blood Culture adequate volume Performed at [facility],  [address] [place], [place] [address]    Culture   Final    NO GROWTH 2 DAYS Performed at [facility], [address], [place], [place] [address]    Report Status PENDING  Incomplete  Urine Culture     Status: Abnormal   Collection Time: 01/07/23  7:12 AM   Specimen: Urine, Clean Catch  Result Value Ref Range Status   Specimen Description   Final    URINE, CLEAN CATCH Performed at [facility], [address], [place], [place] [address]    Special Requests   Final    NONE Reflexed from [phone] Performed at [facility], [address], [place], [place] [address]    Culture (A)  Final    <10,000 COLONIES/mL INSIGNIFICANT GROWTH Performed at [facility], [address], [place], [place] [address]    Report Status 01/08/2023 FINAL  Final  Stool culture     Status: None   Collection Time: 01/07/23 11:03 AM   Specimen: Stool  Result Value Ref Range Status   Salmonella/Shigella Screen WRORD  Final    Comment: (NOTE) Test not performed. The required specimen for the test ordered was not received.      Requires a [employer] transport      Received a Administrator M. was notified 01/08/2023    Campylobacter Culture WRORD  Final    Comment: (NOTE) Test not performed. The required specimen for the test ordered was not received.      Requires a [employer] transport      Received a Administrator M. was notified 01/08/2023    E coli, Shiga toxin Assay WRORD  Final    Comment: (NOTE) Test not performed. The required specimen for the test ordered was not received.      Requires a [employer] transport      Received a Administrator M. was notified 01/08/2023 Performed At: [facility] [address] [place], [place] [id number] Jolene Schimke MD WG:[id number]   C Difficile Quick Screen w PCR reflex     Status: None   Collection Time: 01/08/23 10:25 AM   Specimen:  Stool  Result Value Ref Range Status   C Diff antigen NEGATIVE NEGATIVE Final   C Diff toxin NEGATIVE NEGATIVE Final   C Diff interpretation No C. difficile detected.  Final    Comment: Performed at [facility], [address] [place], [place] [address]  Gastrointestinal Panel by PCR , Stool     Status: Abnormal   Collection Time: 01/08/23 10:25 AM   Specimen: Stool  Result Value Ref Range Status   Campylobacter species DETECTED (A) NOT DETECTED Final    Comment: RESULT CALLED TO, READ BACK BY AND VERIFIED WITH: Ezra Sites AT [id number] 01/09/23.PMF    Plesimonas shigelloides NOT DETECTED NOT DETECTED Final   Salmonella species NOT DETECTED NOT DETECTED Final   Yersinia enterocolitica NOT DETECTED NOT DETECTED Final   Vibrio species NOT DETECTED NOT DETECTED Final   Vibrio cholerae NOT DETECTED NOT DETECTED Final   Enteroaggregative E coli (EAEC) NOT DETECTED NOT DETECTED Final   Enteropathogenic E coli (EPEC) DETECTED (A) NOT DETECTED Final    Comment: RESULT CALLED TO, READ BACK BY AND VERIFIED WITH: Ezra Sites AT [id number] 01/09/23.PMF    Enterotoxigenic E coli (ETEC) NOT DETECTED NOT DETECTED Final   Shiga like toxin producing E coli (STEC) NOT DETECTED NOT DETECTED Final   Shigella/Enteroinvasive E coli (EIEC) NOT DETECTED NOT DETECTED Final   Cryptosporidium NOT DETECTED NOT DETECTED Final   Cyclospora cayetanensis NOT DETECTED NOT DETECTED Final   Entamoeba histolytica NOT DETECTED NOT DETECTED Final   Giardia lamblia NOT DETECTED NOT DETECTED Final   Adenovirus F40/41 NOT DETECTED NOT DETECTED Final   Astrovirus NOT DETECTED NOT DETECTED Final   Norovirus GI/GII NOT DETECTED NOT DETECTED Final   Rotavirus A NOT DETECTED NOT DETECTED Final   Sapovirus (I, II, IV, and V) NOT DETECTED NOT DETECTED Final    Comment: Performed at [employer]  [facility], [address], [place], [place] [address]         Radiology Studies: No results  found.         LOS: 1 day   Time spent= 35 mins    Caidin Heidenreich Joline Maxcy, MD [facility]  If 7PM-7AM, please contact night-coverage  01/09/2023, 8:57 AM

## 2023-01-10 LAB — CBC
HCT: 27.7 % — ABNORMAL LOW (ref 39.0–52.0)
Hemoglobin: 9.1 g/dL — ABNORMAL LOW (ref 13.0–17.0)
MCH: 28.4 pg (ref 26.0–34.0)
MCHC: 32.9 g/dL (ref 30.0–36.0)
MCV: 86.6 fL (ref 80.0–100.0)
Platelets: 272 10*3/uL (ref 150–400)
RBC: 3.2 MIL/uL — ABNORMAL LOW (ref 4.22–5.81)
RDW: 13.7 % (ref 11.5–15.5)
WBC: 7.9 10*3/uL (ref 4.0–10.5)
nRBC: 0 % (ref 0.0–0.2)

## 2023-01-10 LAB — BASIC METABOLIC PANEL
Anion gap: 7 (ref 5–15)
BUN: 6 mg/dL (ref 6–20)
CO2: 25 mmol/L (ref 22–32)
Calcium: 8.4 mg/dL — ABNORMAL LOW (ref 8.9–10.3)
Chloride: 106 mmol/L (ref 98–111)
Creatinine, Ser: 0.79 mg/dL (ref 0.61–1.24)
GFR, Estimated: >60 mL/min (ref >60)
Glucose, Bld: 104 mg/dL — ABNORMAL HIGH (ref 70–99)
Potassium: 3.5 mmol/L (ref 3.5–5.1)
Sodium: 138 mmol/L (ref 135–145)

## 2023-01-10 LAB — CULTURE, BLOOD (ROUTINE X 2)
Culture: NO GROWTH
Culture: NO GROWTH

## 2023-01-10 LAB — MAGNESIUM: Magnesium: 1.8 mg/dL (ref 1.7–2.4)

## 2023-01-10 MED ORDER — HEPARIN SOD (PORK) LOCK FLUSH 100 UNIT/ML IV SOLN
500.0000 [IU] | Freq: Once | INTRAVENOUS | Status: AC
  Administered 2023-01-10: 500 [IU] via INTRAVENOUS
  Filled 2023-01-10: qty 5

## 2023-01-10 MED ORDER — POTASSIUM CHLORIDE CRYS ER 20 MEQ PO TBCR
40.0000 meq | EXTENDED_RELEASE_TABLET | Freq: Once | ORAL | Status: AC
  Administered 2023-01-10: 40 meq via ORAL
  Filled 2023-01-10: qty 2

## 2023-01-10 MED ORDER — AZITHROMYCIN 500 MG PO TABS: 500.0000 mg | ORAL_TABLET | Freq: Every day | ORAL | 0 refills | Status: AC

## 2023-01-10 MED ORDER — MAGNESIUM OXIDE -MG SUPPLEMENT 400 (240 MG) MG PO TABS
800.0000 mg | ORAL_TABLET | Freq: Once | ORAL | Status: AC
  Administered 2023-01-10: 800 mg via ORAL
  Filled 2023-01-10: qty 2

## 2023-01-10 NOTE — TOC Progression Note (Signed)
Transition of Care [facility]) - Progression Note    Patient Details  Name: Joseph Haney MRN: [id number] Date of Birth: May 12, 1976  Transition of Care [facility]) CM/SW Contact  Beckie Busing, RN Phone Number:[phone]  01/10/2023, 12:22 AM  Clinical Narrative:     Transition of Care (TOC) Screening Note   Patient Details  Name: Joseph Haney Date of Birth: 07/19/1976   Transition of Care [facility]) CM/SW Contact:    Beckie Busing, RN Phone Number: 01/10/2023, 12:22 AM    Transition of Care Department [facility]) has reviewed patient and no TOC needs have been identified at this time. We will continue to monitor patient advancement through interdisciplinary progression rounds. If new patient transition needs arise, please place a TOC consult.          Expected Discharge Plan and Services                                               Social Determinants of Health (SDOH) Interventions SDOH Screenings   Food Insecurity: No Food Insecurity (01/07/2023)  Housing: Low Risk  (01/07/2023)  Transportation Needs: No Transportation Needs (01/07/2023)  Utilities: Not At Risk (01/07/2023)  Tobacco Use: Low Risk  (01/07/2023)    Readmission Risk Interventions     No data to display

## 2023-01-10 NOTE — Discharge Summary (Signed)
Physician Discharge Summary  Joseph Haney MWU:[id number] DOB: 03-16-1976 DOA: 01/07/2023  PCP: Frederic Jericho, [name]  Admit date: 01/07/2023 Discharge date: 01/10/2023  Admitted From: Home Disposition: Home  Recommendations for Outpatient Follow-up:  Follow up with PCP in 1-2 weeks Please obtain BMP/CBC in one week your next doctors visit.  Oral azithromycin for 1 more day to complete total 3-day course for Campylobacter enteritis   Discharge Condition: Stable CODE STATUS: Full code Diet recommendation: Regular as tolerated  Brief/Interim Summary: 47 year old with history of hypertension, recurrent colon cancer on chemotherapy follows with Dr. Mosetta Putt comes to the hospital complains of 10 days of nausea, abdominal pain and diarrhea after his chemotherapy.  Upon admission CT scan showed enteritis.  Started on empiric IV Levaquin, Flagyl and fluids.  GI panel was positive for Campylobacter therefore started on azithromycin.  Patient received 2 days of oral azithromycin in the hospital, his symptoms significantly improved and was feeling much better.  Frequency of diarrhea has significantly improved.  He has had about 3 bowel movements in last 24 hours.  Medically stable for discharge today.  Tolerating p.o. without any issues.     Assessment & Plan:  Principal Problem:   Enteritis Active Problems:   Hypokalemia   Cancer of right colon (HCC)   Dehydration   Campylobacter enteritis GI panel was positive for Campylobacter enteritis, C. difficile is negative.  1 more day of oral azithromycin upon discharge to complete total 3-day course.  Significantly improved   Hypokalemia Replete electrolytes as needed   Cancer of right colon [facility]) History of recurrent colon cancer, currently undergoing chemotherapy by Dr. Mosetta Putt. last received neoadjuvant FOLFIRI cycle 6 on 4/29  -Continue outpatient management       Consultations: None  Subjective: Feels great no complaints.  Only 3  formed bowel movements over last 24 hours  Discharge Exam: Vitals:   01/09/23 2140 01/10/23 0511  BP: 129/84 139/84  Pulse: 78 98  Resp: 16   Temp: 98.3 F (36.8 C) 98.1 F (36.7 C)  SpO2: 100% 99%   Vitals:   01/09/23 1358 01/09/23 2140 01/10/23 0511 01/10/23 0513  BP: 118/63 129/84 139/84   Pulse: 90 78 98   Resp:  16    Temp: 98.8 F (37.1 C) 98.3 F (36.8 C) 98.1 F (36.7 C)   TempSrc: Oral Oral Oral   SpO2: 97% 100% 99%   Weight:    82.7 kg    General: Pt is alert, awake, not in acute distress Cardiovascular: RRR, S1/S2 +, no rubs, no gallops Respiratory: CTA bilaterally, no wheezing, no rhonchi Abdominal: Soft, NT, ND, bowel sounds + Extremities: no edema, no cyanosis  Discharge Instructions   Allergies as of 01/10/2023       Reactions   Latex Swelling, Other (See Comments)   Pain and swelling of penis after urinary catheter placed last surgery.   Morphine And Related Itching        Medication List     TAKE these medications    azithromycin 500 MG tablet Commonly known as: ZITHROMAX Take 1 tablet (500 mg total) by mouth daily for 1 day.   carvedilol 25 MG tablet Commonly known as: COREG Take 1 tablet (25 mg total) by mouth 2 (two) times daily with a meal.   cyclobenzaprine 10 MG tablet Commonly known as: FLEXERIL Take 10-20 mg by mouth 2 (two) times daily as needed for muscle spasms.   dexamethasone 4 MG tablet Commonly known as: DECADRON Take 1 tablet (4  mg total) by mouth daily. Take for 3-5 days after chemo for nausea and fatigue   diphenoxylate-atropine 2.5-0.025 MG tablet Commonly known as: LOMOTIL Take 1 tablet by mouth 4 (four) times daily as needed for diarrhea or loose stools.   escitalopram 10 MG tablet Commonly known as: LEXAPRO Take 10 mg by mouth daily.   ferrous sulfate 325 (65 FE) MG tablet Take 1 tablet (325 mg total) by mouth 2 (two) times daily with a meal.   gabapentin 300 MG capsule Commonly known as:  NEURONTIN Take 300 mg by mouth 3 (three) times daily.   HYDROcodone-acetaminophen 5-325 MG tablet Commonly known as: NORCO/VICODIN Take 1 tablet by mouth every 6 (six) hours as needed for moderate pain.   lidocaine-prilocaine cream Commonly known as: EMLA Apply to affected area once   magic mouthwash (nystatin, lidocaine, diphenhydrAMINE, alum & mag hydroxide) suspension Take 5 mLs by mouth 3 (three) times daily as needed for mouth pain.   ondansetron 8 MG tablet Commonly known as: ZOFRAN Take 1 tablet (8 mg total) by mouth every 8 (eight) hours as needed for nausea, vomiting or refractory nausea / vomiting. Start on the third day after chemotherapy.   Oxycodone HCl 10 MG Tabs Take 10 mg by mouth every 6 (six) hours as needed.   oxyCODONE 5 MG immediate release tablet Commonly known as: Roxicodone Take 1 tablet (5 mg total) by mouth every 6 (six) hours as needed for severe pain.   polyethylene glycol 17 g packet Commonly known as: MIRALAX / GLYCOLAX Take 17 g by mouth daily as needed for mild constipation or moderate constipation.   potassium chloride SA 20 MEQ tablet Commonly known as: KLOR-CON M Take 1 tablet (20 mEq total) by mouth daily for 5 days.   prochlorperazine 10 MG tablet Commonly known as: COMPAZINE Take 1 tablet (10 mg total) by mouth every 6 (six) hours as needed for nausea or vomiting.   promethazine 25 MG tablet Commonly known as: PHENERGAN Take 1 tablet (25 mg total) by mouth every 6 (six) hours as needed for nausea or vomiting.        Follow-up Information     Frederic Jericho, [name] Follow up in 1 week(s).   Specialty: Physician Assistant Contact information: [phone] [place] [address] [phone]                Allergies  Allergen Reactions   Latex Swelling and Other (See Comments)    Pain and swelling of penis after urinary catheter placed last surgery.   Morphine And Related Itching    You were  cared for by a hospitalist during your hospital stay. If you have any questions about your discharge medications or the care you received while you were in the hospital after you are discharged, you can call the unit and asked to speak with the hospitalist on call if the hospitalist that took care of you is not available. Once you are discharged, your primary care physician will handle any further medical issues. Please note that no refills for any discharge medications will be authorized once you are discharged, as it is imperative that you return to your primary care physician (or establish a relationship with a primary care physician if you do not have one) for your aftercare needs so that they can reassess your need for medications and monitor your lab values.  You were cared for by a hospitalist during your hospital stay. If you have any questions  about your discharge medications or the care you received while you were in the hospital after you are discharged, you can call the unit and asked to speak with the hospitalist on call if the hospitalist that took care of you is not available. Once you are discharged, your primary care physician will handle any further medical issues. Please note that NO REFILLS for any discharge medications will be authorized once you are discharged, as it is imperative that you return to your primary care physician (or establish a relationship with a primary care physician if you do not have one) for your aftercare needs so that they can reassess your need for medications and monitor your lab values.  Please request your Prim.MD to go over all Hospital Tests and Procedure/Radiological results at the follow up, please get all Hospital records sent to your Prim MD by signing hospital release before you go home.  Get CBC, CMP, 2 view Chest X ray checked  by Primary MD during your next visit or SNF MD in 5-7 days ( we routinely change or add medications that can affect your  baseline labs and fluid status, therefore we recommend that you get the mentioned basic workup next visit with your PCP, your PCP may decide not to get them or add new tests based on their clinical decision)  On your next visit with your primary care physician please Get Medicines reviewed and adjusted.  If you experience worsening of your admission symptoms, develop shortness of breath, life threatening emergency, suicidal or homicidal thoughts you must seek medical attention immediately by calling 911 or calling your MD immediately  if symptoms less severe.  You Must read complete instructions/literature along with all the possible adverse reactions/side effects for all the Medicines you take and that have been prescribed to you. Take any new Medicines after you have completely understood and accpet all the possible adverse reactions/side effects.   Do not drive, operate heavy machinery, perform activities at heights, swimming or participation in water activities or provide baby sitting services if your were admitted for syncope or siezures until you have seen by Primary MD or a Neurologist and advised to do so again.  Do not drive when taking Pain medications.   Procedures/Studies: CT Renal Stone Study  Result Date: 01/07/2023 CLINICAL DATA:  Abdominal/flank pain.  Metastatic colon cancer. EXAM: CT ABDOMEN AND PELVIS WITHOUT CONTRAST TECHNIQUE: Multidetector CT imaging of the abdomen and pelvis was performed following the standard protocol without IV contrast. RADIATION DOSE REDUCTION: This exam was performed according to the departmental dose-optimization program which includes automated exposure control, adjustment of the mA and/or kV according to patient size and/or use of iterative reconstruction technique. COMPARISON:  09/19/2022 FINDINGS: Lower chest: Streaky density with volume loss at the right more than left lung base attributed to scarring when compared to prior. Coronary atherosclerosis.  Hepatobiliary: No low-density mass/metastasis in the upper central liver measuring 2.5 cm.Cholelithiasis. No biliary dilatation. Pancreas: Unremarkable. Spleen: Unremarkable. Adrenals/Urinary Tract: Negative adrenals. No hydronephrosis or stone. Unremarkable bladder. Stomach/Bowel: Proximal colectomy with unremarkable enterocolonic anastomosis. Mild low-density thickening of the distal ileum before the anastomosis. Some distal colonic fluid levels are noted and there is mild accentuation of ileocolic lymph nodes. No bowel obstruction or perforation Vascular/Lymphatic: No acute vascular abnormality. Reproductive:No pathologic findings. Other: No ascites or pneumoperitoneum. Musculoskeletal: No acute abnormalities. IMPRESSION: 1. Mild distal enteritis. 2. Cholelithiasis and known hepatic metastatic disease. Electronically Signed   By: Tiburcio Pea M.D.   On: 01/07/2023 06:23  DG Chest Port 1 View  Result Date: 01/07/2023 CLINICAL DATA:  Nausea, vomiting, chest pain EXAM: PORTABLE CHEST 1 VIEW COMPARISON:  None Available. FINDINGS: Lung volumes are small. Linear atelectasis within the right lung base. Lungs are otherwise clear. No pneumothorax or pleural effusion. Right internal jugular chest port tip seen at the superior cavoatrial junction. Cardiac size within normal limits. Pulmonary vascularity is normal. Osseous structures are age-appropriate. No acute bone abnormality. IMPRESSION: 1. Pulmonary hypoinflation. Electronically Signed   By: Helyn Numbers M.D.   On: 01/07/2023 02:54     The results of significant diagnostics from this hospitalization (including imaging, microbiology, ancillary and laboratory) are listed below for reference.     Microbiology: Recent Results (from the past 240 hour(s))  Resp panel by RT-PCR (RSV, Flu A&B, Covid) Anterior Nasal Swab     Status: None   Collection Time: 01/07/23  3:11 AM   Specimen: Anterior Nasal Swab  Result Value Ref Range Status   SARS Coronavirus 2  by RT PCR NEGATIVE NEGATIVE Final    Comment: (NOTE) SARS-CoV-2 target nucleic acids are NOT DETECTED.  The SARS-CoV-2 RNA is generally detectable in upper respiratory specimens during the acute phase of infection. The lowest concentration of SARS-CoV-2 viral copies this assay can detect is 138 copies/mL. A negative result does not preclude SARS-Cov-2 infection and should not be used as the sole basis for treatment or other patient management decisions. A negative result may occur with  improper specimen collection/handling, submission of specimen other than nasopharyngeal swab, presence of viral mutation(s) within the areas targeted by this assay, and inadequate number of viral copies(<138 copies/mL). A negative result must be combined with clinical observations, patient history, and epidemiological information. The expected result is Negative.  Fact Sheet for Patients:  BloggerCourse.com  Fact Sheet for Healthcare Providers:  SeriousBroker.it  This test is no t yet approved or cleared by the [place] FDA and  has been authorized for detection and/or diagnosis of SARS-CoV-2 by FDA under an Emergency Use Authorization (EUA). This EUA will remain  in effect (meaning this test can be used) for the duration of the COVID-19 declaration under Section 564(b)(1) of the Act, 21 U.S.C.section 360bbb-3(b)(1), unless the authorization is terminated  or revoked sooner.       Influenza A by PCR NEGATIVE NEGATIVE Final   Influenza B by PCR NEGATIVE NEGATIVE Final    Comment: (NOTE) The Xpert Xpress SARS-CoV-2/FLU/RSV plus assay is intended as an aid in the diagnosis of influenza from Nasopharyngeal swab specimens and should not be used as a sole basis for treatment. Nasal washings and aspirates are unacceptable for Xpert Xpress SARS-CoV-2/FLU/RSV testing.  Fact Sheet for Patients: BloggerCourse.com  Fact  Sheet for Healthcare Providers: SeriousBroker.it  This test is not yet approved or cleared by the [place] FDA and has been authorized for detection and/or diagnosis of SARS-CoV-2 by FDA under an Emergency Use Authorization (EUA). This EUA will remain in effect (meaning this test can be used) for the duration of the COVID-19 declaration under Section 564(b)(1) of the Act, 21 U.S.C. section 360bbb-3(b)(1), unless the authorization is terminated or revoked.     Resp Syncytial Virus by PCR NEGATIVE NEGATIVE Final    Comment: (NOTE) Fact Sheet for Patients: BloggerCourse.com  Fact Sheet for Healthcare Providers: SeriousBroker.it  This test is not yet approved or cleared by the [place] FDA and has been authorized for detection and/or diagnosis of SARS-CoV-2 by FDA under an Emergency Use Authorization (EUA). This EUA  will remain in effect (meaning this test can be used) for the duration of the COVID-19 declaration under Section 564(b)(1) of the Act, 21 U.S.C. section 360bbb-3(b)(1), unless the authorization is terminated or revoked.  Performed at [facility], [address], [place], [place] [address]   Blood Culture (routine x 2)     Status: None (Preliminary result)   Collection Time: 01/07/23  3:25 AM   Specimen: BLOOD  Result Value Ref Range Status   Specimen Description   Final    BLOOD SITE NOT SPECIFIED Performed at [facility], [address], [place], [place] [address]    Special Requests   Final    BOTTLES DRAWN AEROBIC AND ANAEROBIC Blood Culture results may not be optimal due to an inadequate volume of blood received in culture bottles Performed at [facility], [address] [place], [place] [address]    Culture   Final    NO GROWTH 2 DAYS Performed at [facility], [address] [place], [place] [address]     Report Status PENDING  Incomplete  Blood Culture (routine x 2)     Status: None (Preliminary result)   Collection Time: 01/07/23  6:20 AM   Specimen: BLOOD  Result Value Ref Range Status   Specimen Description   Final    BLOOD SITE NOT SPECIFIED Performed at [facility], [address] [place], [place] [address]    Special Requests   Final    BOTTLES DRAWN AEROBIC AND ANAEROBIC Blood Culture adequate volume Performed at [facility], [address], [place], [place] [address]    Culture   Final    NO GROWTH 2 DAYS Performed at [facility], [address] [place], [place] [address]    Report Status PENDING  Incomplete  Urine Culture     Status: Abnormal   Collection Time: 01/07/23  7:12 AM   Specimen: Urine, Clean Catch  Result Value Ref Range Status   Specimen Description   Final    URINE, CLEAN CATCH Performed at [facility], [address], [place], [place] [address]    Special Requests   Final    NONE Reflexed from [phone] Performed at [facility], [address] [place], [place] [address]    Culture (A)  Final    <10,000 COLONIES/mL INSIGNIFICANT GROWTH Performed at [facility], [address] [place], [place] [address]    Report Status 01/08/2023 FINAL  Final  Stool culture     Status: None   Collection Time: 01/07/23 11:03 AM   Specimen: Stool  Result Value Ref Range Status   Salmonella/Shigella Screen WRORD  Final    Comment: (NOTE) Test not performed. The required specimen for the test ordered was not received.      Requires a ParaPak Orange transport      Received a Administrator M. was notified 01/08/2023    Campylobacter Culture WRORD  Final    Comment: (NOTE) Test not performed. The required specimen for the test ordered was not received.      Requires a ParaPak Orange transport      Received a Administrator M. was notified 01/08/2023     E coli, Shiga toxin Assay WRORD  Final    Comment: (NOTE) Test not performed. The required specimen for the test ordered was  not received.      Requires a ParaPak Orange transport      Received a Administrator M. was notified 01/08/2023 Performed At: [facility] [address] [place], [place] [id number] Jolene Schimke MD IO:[id number]   C Difficile Quick Screen w PCR reflex     Status: None   Collection Time: 01/08/23 10:25 AM   Specimen: Stool  Result Value Ref Range Status   C Diff antigen NEGATIVE NEGATIVE Final   C Diff toxin NEGATIVE NEGATIVE Final   C Diff interpretation No C. difficile detected.  Final    Comment: Performed at [facility], [address], [place], [place] [address]  Gastrointestinal Panel by PCR , Stool     Status: Abnormal   Collection Time: 01/08/23 10:25 AM   Specimen: Stool  Result Value Ref Range Status   Campylobacter species DETECTED (A) NOT DETECTED Final    Comment: RESULT CALLED TO, READ BACK BY AND VERIFIED WITH: Ezra Sites AT 0272 01/09/23.PMF    Plesimonas shigelloides NOT DETECTED NOT DETECTED Final   Salmonella species NOT DETECTED NOT DETECTED Final   Yersinia enterocolitica NOT DETECTED NOT DETECTED Final   Vibrio species NOT DETECTED NOT DETECTED Final   Vibrio cholerae NOT DETECTED NOT DETECTED Final   Enteroaggregative E coli (EAEC) NOT DETECTED NOT DETECTED Final   Enteropathogenic E coli (EPEC) DETECTED (A) NOT DETECTED Final    Comment: RESULT CALLED TO, READ BACK BY AND VERIFIED WITH: Ezra Sites AT 5366 01/09/23.PMF    Enterotoxigenic E coli (ETEC) NOT DETECTED NOT DETECTED Final   Shiga like toxin producing E coli (STEC) NOT DETECTED NOT DETECTED Final   Shigella/Enteroinvasive E coli (EIEC) NOT DETECTED NOT DETECTED Final   Cryptosporidium NOT DETECTED NOT DETECTED Final   Cyclospora cayetanensis NOT DETECTED NOT DETECTED Final   Entamoeba histolytica NOT DETECTED  NOT DETECTED Final   Giardia lamblia NOT DETECTED NOT DETECTED Final   Adenovirus F40/41 NOT DETECTED NOT DETECTED Final   Astrovirus NOT DETECTED NOT DETECTED Final   Norovirus GI/GII NOT DETECTED NOT DETECTED Final   Rotavirus A NOT DETECTED NOT DETECTED Final   Sapovirus (I, II, IV, and V) NOT DETECTED NOT DETECTED Final    Comment: Performed at [facility], [address] [place], [place] [address]  Stool culture     Status: None (Preliminary result)   Collection Time: 01/08/23  3:15 PM  Result Value Ref Range Status   Salmonella/Shigella Screen PENDING  Incomplete   Campylobacter Culture PENDING  Incomplete   E coli, Shiga toxin Assay Negative Negative Final    Comment: (NOTE) Performed At: [facility] [address] [place], [place] [id number] Jolene Schimke MD VF:[id number]      Labs: BNP (last 3 results) No results for input(s): "BNP" in the last 8760 hours. Basic Metabolic Panel: Recent Labs  Lab 01/07/23 0253 01/07/23 0355 01/07/23 1044 01/08/23 0500 01/09/23 0705 01/10/23 0541  NA 131* 134*  --  135 138 138  K 2.8* 2.8* 3.4* 3.0* 3.2* 3.5  CL 99 104  --  104 104 106  CO2 19* 18*  --  24 26 25   GLUCOSE 157* 138*  --  107* 106* 104*  BUN 18 17  --  6 6 6   CREATININE 1.17 1.07  --  0.81 0.80 0.79  CALCIUM 8.7* 8.8*  --  7.8* 8.3* 8.4*  MG  --  2.1  --   --   --  1.8   Liver Function Tests: Recent Labs  Lab 01/07/23 0355  AST 27  ALT 31  ALKPHOS 77  BILITOT 0.8  PROT 8.2*  ALBUMIN 3.7   No results for input(s): "LIPASE", "AMYLASE" in the last 168 hours. No results for input(s): "AMMONIA" in the last 168 hours. CBC: Recent Labs  Lab 01/07/23 0253 01/08/23 0500 01/09/23 0807 01/10/23 0541  WBC 9.0 5.8 7.3 7.9  HGB 12.2* 13.4 9.0* 9.1*  HCT 36.7* 41.1 27.6* 27.7*  MCV 84.8 86.2 86.8 86.6  PLT 360 198 250 272   Cardiac Enzymes: No results for input(s): "CKTOTAL", "CKMB", "CKMBINDEX", "TROPONINI" in the last 168  hours. BNP: Invalid input(s): "POCBNP" CBG: No results for input(s): "GLUCAP" in the last 168 hours. D-Dimer No results for input(s): "DDIMER" in the last 72 hours. Hgb A1c No results for input(s): "HGBA1C" in the last 72 hours. Lipid Profile No results for input(s): "CHOL", "HDL", "LDLCALC", "TRIG", "CHOLHDL", "LDLDIRECT" in the last 72 hours. Thyroid function studies No results for input(s): "TSH", "T4TOTAL", "T3FREE", "THYROIDAB" in the last 72 hours.  Invalid input(s): "FREET3" Anemia work up No results for input(s): "VITAMINB12", "FOLATE", "FERRITIN", "TIBC", "IRON", "RETICCTPCT" in the last 72 hours. Urinalysis    Component Value Date/Time   COLORURINE AMBER (A) 01/07/2023 0712   APPEARANCEUR CLOUDY (A) 01/07/2023 0712   LABSPEC >1.046 (H) 01/07/2023 0712   PHURINE 5.0 01/07/2023 0712   GLUCOSEU NEGATIVE 01/07/2023 0712   HGBUR NEGATIVE 01/07/2023 0712   BILIRUBINUR SMALL (A) 01/07/2023 0712   KETONESUR NEGATIVE 01/07/2023 0712   PROTEINUR >=300 (A) 01/07/2023 0712   UROBILINOGEN 1.0 05/21/2015 0606   NITRITE NEGATIVE 01/07/2023 0712   LEUKOCYTESUR NEGATIVE 01/07/2023 0712   Sepsis Labs Recent Labs  Lab 01/07/23 0253 01/08/23 0500 01/09/23 0807 01/10/23 0541  WBC 9.0 5.8 7.3 7.9   Microbiology Recent Results (from the past 240 hour(s))  Resp panel by RT-PCR (RSV, Flu A&B, Covid) Anterior Nasal Swab     Status: None   Collection Time: 01/07/23  3:11 AM   Specimen: Anterior Nasal Swab  Result Value Ref Range Status   SARS Coronavirus 2 by RT PCR NEGATIVE NEGATIVE Final    Comment: (NOTE) SARS-CoV-2 target nucleic acids are NOT DETECTED.  The SARS-CoV-2 RNA is generally detectable in upper respiratory specimens during the acute phase of infection. The lowest concentration of SARS-CoV-2 viral copies this assay can detect is 138 copies/mL. A negative result does not preclude SARS-Cov-2 infection and should not be used as the sole basis for treatment or other  patient management decisions. A negative result may occur with  improper specimen collection/handling, submission of specimen other than nasopharyngeal swab, presence of viral mutation(s) within the areas targeted by this assay, and inadequate number of viral copies(<138 copies/mL). A negative result must be combined with clinical observations, patient history, and epidemiological information. The expected result is Negative.  Fact Sheet for Patients:  BloggerCourse.com  Fact Sheet for Healthcare Providers:  SeriousBroker.it  This test is no t yet approved or cleared by the [place] FDA and  has been authorized for detection and/or diagnosis of SARS-CoV-2 by FDA under an Emergency Use Authorization (EUA). This EUA will remain  in effect (meaning this test can be used) for the duration of the COVID-19 declaration under Section 564(b)(1) of the Act, 21 U.S.C.section 360bbb-3(b)(1), unless the authorization is terminated  or revoked sooner.       Influenza A by PCR NEGATIVE NEGATIVE Final   Influenza B by PCR  NEGATIVE NEGATIVE Final    Comment: (NOTE) The Xpert Xpress SARS-CoV-2/FLU/RSV plus assay is intended as an aid in the diagnosis of influenza from Nasopharyngeal swab specimens and should not be used as a sole basis for treatment. Nasal washings and aspirates are unacceptable for Xpert Xpress SARS-CoV-2/FLU/RSV testing.  Fact Sheet for Patients: BloggerCourse.com  Fact Sheet for Healthcare Providers: SeriousBroker.it  This test is not yet approved or cleared by the [place] FDA and has been authorized for detection and/or diagnosis of SARS-CoV-2 by FDA under an Emergency Use Authorization (EUA). This EUA will remain in effect (meaning this test can be used) for the duration of the COVID-19 declaration under Section 564(b)(1) of the Act, 21 U.S.C. section  360bbb-3(b)(1), unless the authorization is terminated or revoked.     Resp Syncytial Virus by PCR NEGATIVE NEGATIVE Final    Comment: (NOTE) Fact Sheet for Patients: BloggerCourse.com  Fact Sheet for Healthcare Providers: SeriousBroker.it  This test is not yet approved or cleared by the [place] FDA and has been authorized for detection and/or diagnosis of SARS-CoV-2 by FDA under an Emergency Use Authorization (EUA). This EUA will remain in effect (meaning this test can be used) for the duration of the COVID-19 declaration under Section 564(b)(1) of the Act, 21 U.S.C. section 360bbb-3(b)(1), unless the authorization is terminated or revoked.  Performed at [facility], [address], [place], [place] [address]   Blood Culture (routine x 2)     Status: None (Preliminary result)   Collection Time: 01/07/23  3:25 AM   Specimen: BLOOD  Result Value Ref Range Status   Specimen Description   Final    BLOOD SITE NOT SPECIFIED Performed at [facility], [address], [place], [place] [address]    Special Requests   Final    BOTTLES DRAWN AEROBIC AND ANAEROBIC Blood Culture results may not be optimal due to an inadequate volume of blood received in culture bottles Performed at [facility], [address], [place], [place] [address]    Culture   Final    NO GROWTH 2 DAYS Performed at [facility], [address] [place], [place] [address]    Report Status PENDING  Incomplete  Blood Culture (routine x 2)     Status: None (Preliminary result)   Collection Time: 01/07/23  6:20 AM   Specimen: BLOOD  Result Value Ref Range Status   Specimen Description   Final    BLOOD SITE NOT SPECIFIED Performed at [facility], [address], [place], [place] [address]    Special Requests   Final    BOTTLES DRAWN AEROBIC AND ANAEROBIC Blood Culture  adequate volume Performed at [facility], [address], [place], [place] [address]    Culture   Final    NO GROWTH 2 DAYS Performed at [facility], [address] [place], [place] [address]    Report Status PENDING  Incomplete  Urine Culture     Status: Abnormal   Collection Time: 01/07/23  7:12 AM   Specimen: Urine, Clean Catch  Result Value Ref Range Status   Specimen Description   Final    URINE, CLEAN CATCH Performed at [facility], [address], [place], [place] [address]    Special Requests   Final    NONE Reflexed from [id number] Performed at [facility], [address] [place], [place] [address]    Culture (  A)  Final    <10,000 COLONIES/mL INSIGNIFICANT GROWTH Performed at [facility], [address], [place], [place] [address]    Report Status 01/08/2023 FINAL  Final  Stool culture     Status: None   Collection Time: 01/07/23 11:03 AM   Specimen: Stool  Result Value Ref Range Status   Salmonella/Shigella Screen WRORD  Final    Comment: (NOTE) Test not performed. The required specimen for the test ordered was not received.      Requires a ParaPak Orange transport      Received a Administrator M. was notified 01/08/2023    Campylobacter Culture WRORD  Final    Comment: (NOTE) Test not performed. The required specimen for the test ordered was not received.      Requires a ParaPak Orange transport      Received a Administrator M. was notified 01/08/2023    E coli, Shiga toxin Assay WRORD  Final    Comment: (NOTE) Test not performed. The required specimen for the test ordered was not received.      Requires a ParaPak Orange transport      Received a Administrator M. was notified 01/08/2023 Performed At: [facility] [address] [place], [place] [id number] Jolene Schimke MD AO:[id number]   C Difficile Quick Screen w PCR reflex      Status: None   Collection Time: 01/08/23 10:25 AM   Specimen: Stool  Result Value Ref Range Status   C Diff antigen NEGATIVE NEGATIVE Final   C Diff toxin NEGATIVE NEGATIVE Final   C Diff interpretation No C. difficile detected.  Final    Comment: Performed at [facility], [address] [place], [place] [address]  Gastrointestinal Panel by PCR , Stool     Status: Abnormal   Collection Time: 01/08/23 10:25 AM   Specimen: Stool  Result Value Ref Range Status   Campylobacter species DETECTED (A) NOT DETECTED Final    Comment: RESULT CALLED TO, READ BACK BY AND VERIFIED WITH: Ezra Sites AT [id number] 01/09/23.PMF    Plesimonas shigelloides NOT DETECTED NOT DETECTED Final   Salmonella species NOT DETECTED NOT DETECTED Final   Yersinia enterocolitica NOT DETECTED NOT DETECTED Final   Vibrio species NOT DETECTED NOT DETECTED Final   Vibrio cholerae NOT DETECTED NOT DETECTED Final   Enteroaggregative E coli (EAEC) NOT DETECTED NOT DETECTED Final   Enteropathogenic E coli (EPEC) DETECTED (A) NOT DETECTED Final    Comment: RESULT CALLED TO, READ BACK BY AND VERIFIED WITH: Ezra Sites AT [id number] 01/09/23.PMF    Enterotoxigenic E coli (ETEC) NOT DETECTED NOT DETECTED Final   Shiga like toxin producing E coli (STEC) NOT DETECTED NOT DETECTED Final   Shigella/Enteroinvasive E coli (EIEC) NOT DETECTED NOT DETECTED Final   Cryptosporidium NOT DETECTED NOT DETECTED Final   Cyclospora cayetanensis NOT DETECTED NOT DETECTED Final   Entamoeba histolytica NOT DETECTED NOT DETECTED Final   Giardia lamblia NOT DETECTED NOT DETECTED Final   Adenovirus F40/41 NOT DETECTED NOT DETECTED Final   Astrovirus NOT DETECTED NOT DETECTED Final   Norovirus GI/GII NOT DETECTED NOT DETECTED Final   Rotavirus A NOT DETECTED NOT DETECTED Final   Sapovirus (I, II, IV, and V) NOT DETECTED NOT DETECTED Final    Comment: Performed at [facility], [address], [place], [place] [address]   Stool culture  Status: None (Preliminary result)   Collection Time: 01/08/23  3:15 PM  Result Value Ref Range Status   Salmonella/Shigella Screen PENDING  Incomplete   Campylobacter Culture PENDING  Incomplete   E coli, Shiga toxin Assay Negative Negative Final    Comment: (NOTE) Performed At: [facility] [address] [place], [place] [id number] Jolene Schimke MD MV:[id number]      Time coordinating discharge:  I have spent 35 minutes face to face with the patient and on the ward discussing the patients care, assessment, plan and disposition with other care givers. >50% of the time was devoted counseling the patient about the risks and benefits of treatment/Discharge disposition and coordinating care.   SIGNED:   Dimple Nanas, MD  [facility] 01/10/2023, 11:01 AM   If 7PM-7AM, please contact night-coverage

## 2023-01-11 LAB — STOOL CULTURE: E coli, Shiga toxin Assay: NEGATIVE

## 2023-01-11 LAB — STOOL CULTURE REFLEX - RSASHR

## 2023-01-11 LAB — CULTURE, BLOOD (ROUTINE X 2): Special Requests: ADEQUATE

## 2023-01-12 LAB — CULTURE, BLOOD (ROUTINE X 2)

## 2023-01-12 LAB — STOOL CULTURE

## 2023-01-12 LAB — STOOL CULTURE REFLEX - CMPCXR

## 2023-01-13 DIAGNOSIS — M5416 Radiculopathy, lumbar region: Secondary | ICD-10-CM | POA: Diagnosis not present

## 2023-01-13 DIAGNOSIS — M5136 Other intervertebral disc degeneration, lumbar region: Secondary | ICD-10-CM | POA: Diagnosis not present

## 2023-01-13 DIAGNOSIS — M542 Cervicalgia: Secondary | ICD-10-CM | POA: Diagnosis not present

## 2023-01-13 DIAGNOSIS — G894 Chronic pain syndrome: Secondary | ICD-10-CM | POA: Diagnosis not present

## 2023-01-13 DIAGNOSIS — G8929 Other chronic pain: Secondary | ICD-10-CM | POA: Diagnosis not present

## 2023-01-16 ENCOUNTER — Inpatient Hospital Stay (HOSPITAL_BASED_OUTPATIENT_CLINIC_OR_DEPARTMENT_OTHER): Payer: Medicaid Other | Admitting: Hematology

## 2023-01-16 DIAGNOSIS — C182 Malignant neoplasm of ascending colon: Secondary | ICD-10-CM

## 2023-01-16 NOTE — Progress Notes (Signed)
[facility]   Telephone:([phone] Fax:([phone]   Clinic Follow up Note   Patient Care Team: Frederic Jericho, [name] as PCP - General (Physician Assistant) Axel Filler, MD as Consulting Physician (General Surgery) Pollyann Samples, NP as Nurse Practitioner (Nurse Practitioner) Malachy Mood, MD as Consulting Physician (Hematology) Ewing Schlein, MD as Referring Physician (Pain Medicine)  Date of Service:  01/16/2023  I connected with Joseph Haney on 01/16/2023 at  1:00 PM EDT by telephone visit and verified that I am speaking with the correct person using two identifiers.  I discussed the limitations, risks, security and privacy concerns of performing an evaluation and management service by telephone and the availability of in person appointments. I also discussed with the patient that there may be a patient responsible charge related to this service. The patient expressed understanding and agreed to proceed.   Other persons participating in the visit and their role in the encounter:  No  Patient's location:  Home Provider's location:  [facility] Office  CHIEF COMPLAINT: f/u of recurrent colon cancer   CURRENT THERAPY:    ASSESSMENT & PLAN:  Joseph Haney is a 47 y.o. 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 Derrell Lolling 07/23/21, final path showed: pT4a lesion with LVI and PNI, no perforation; clear margins; 9/19 positive LNs, pN2b --He received Capox 09/11/21 - 01/15/22, oxali discontinued due to significant nausea/vomiting and headaches. He completed Xeloda in late June 2023.  -He unfortunately developed oligo liver metastasis based on PET/CT in 08/2022, liver biopsy was attempted but did not feasible.  -Guardian review was positive for circulating tumor DNA, consistent with recurrence. -Patient requested second opinion of surgery, was seen at  [facility].  Neoadjuvant chemotherapy recommended before surgical resection, and he agreed. -He started chemotherapy FOLFIRI on 10/20/2022, no bevacizumab due to pending surgery  -NGS Tempus showed no targetable therapy, KRAS/NRAS/BRAF wild type, would not recommend EGFR inhibitor for first line due to his primary right side colon cancer. -He was recently hospitalized for Campylobacter enteritis, he has recovered well. -He completed a planned 3 months chemotherapy on December 29, 2022.  I encourage him to f/u with Dr. Gwenlyn Perking at [facility] to schedule his surgery   PLAN: -I  will message Dr.Nussbaum to get a surgery date -schedule a visit after surgery    SUMMARY OF ONCOLOGIC HISTORY: Oncology History Overview Note   Cancer Staging  Cancer of right colon [facility]) Staging form: Colon and Rectum, AJCC 8th Edition - Pathologic stage from 07/23/2021: Stage IIIC (pT4a, pN2b, cM0) - Signed by Pollyann Samples, NP on 08/15/2021 Stage prefix: Initial diagnosis Histologic grading system: 4 grade system Histologic grade (G): G2 Laterality: Right Lymph-vascular invasion (LVI): LVI present/identified, NOS Perineural invasion (PNI): Present Microsatellite instability (MSI): Stable     Cancer of right colon (HCC)  07/19/2021 Imaging   CT AP IMPRESSION: Focal area of high-grade narrowing of the proximal ascending colon may be sequela of chronic inflammation and stricture versus a mass. There is mild distension of the cecum and terminal ileum. Clinical correlation and further evaluation with lower GI study or colonoscopy recommended.   07/22/2021 Procedure   Colonoscopy A severe stenosis was found in the distal ascending colon and was non-traversed. It was ulcerated and inflamed. This could be inflammatory versus malignant stricture.   07/22/2021 Initial Biopsy   FINAL MICROSCOPIC DIAGNOSIS:  A. COLON, ASCENDING, BIOPSY:  -  Invasive colorectal adenocarcinoma, moderately differentiated.    07/23/2021  Definitive Surgery   PRE-OPERATIVE DIAGNOSIS:  right colon mass POST-OPERATIVE DIAGNOSIS:  right colon mass PROCEDURE:  Procedure(s): XI ROBOT ASSISTED LAPAROSCOPIC RIGHT COLECTOMY (N/A) Dr. Derrell Lolling   07/23/2021 Pathology Results   FINAL MICROSCOPIC DIAGNOSIS:  A. COLON, RIGHT, RESECTION:  - Invasive moderately differentiated colonic adenocarcinoma, 4.5 cm.  - Carcinoma extends into pericolonic connective tissue and focally  involves serosal surface.  - Margins not involved.  - Metastatic carcinoma in nine of nineteen lymph nodes (9/19).  - Unremarkable appendix. MMR normal, MSI-stable    07/23/2021 Cancer Staging   Staging form: Colon and Rectum, AJCC 8th Edition - Pathologic stage from 07/23/2021: Stage IIIC (pT4a, pN2b, cM0) - Signed by Pollyann Samples, NP on 08/15/2021 Stage prefix: Initial diagnosis Histologic grading system: 4 grade system Histologic grade (G): G2 Laterality: Right Lymph-vascular invasion (LVI): LVI present/identified, NOS Perineural invasion (PNI): Present Microsatellite instability (MSI): Stable   07/24/2021 Initial Diagnosis   Colon adenocarcinoma (HCC)   07/25/2021 Tumor Marker   CEA: 2.9   07/28/2021 Imaging   CT chest IMPRESSION: 1. No evidence of metastatic disease within the chest. 2. Lungs demonstrate atelectasis, most evident lower lobes and bases of the left upper lobe lingula and right middle lobe. No convincing pneumonia and no evidence of pulmonary edema.     09/11/2021 - 01/15/2022 Chemotherapy   Patient is on Treatment Plan : COLORECTAL Xelox (Capeox) q21d     08/2022 Progression   PET scan showed hypermetabolic oligo liver metastasis, biopsy attempted but liver lesion was not seen by [place]. GuardantReveal was also positive for circulating tumor DNA    09/02/2022 - 09/02/2022 Chemotherapy   Patient is on Treatment Plan : COLORECTAL FOLFIRI + Bevacizumab q14d      Genetic Testing   Negative genetic testing. No pathogenic variants  identified on the [facility] Multi-Cancer+RNA panel. VUS in APC called [id number] identified. The report date is 09/15/2022.  The Multi-Cancer + RNA Panel offered by [facility] includes sequencing and/or deletion/duplication analysis of the following 70 genes:  AIP*, ALK, APC*, ATM*, AXIN2*, BAP1*, BARD1*, BLM*, BMPR1A*, BRCA1*, BRCA2*, BRIP1*, CDC73*, CDH1*, CDK4, CDKN1B*, CDKN2A, CHEK2*, CTNNA1*, DICER1*, EPCAM, EGFR, FH*, FLCN*, GREM1, HOXB13, KIT, LZTR1, MAX*, MBD4, MEN1*, MET, MITF, MLH1*, MSH2*, MSH3*, MSH6*, MUTYH*, NF1*, NF2*, NTHL1*, PALB2*, PDGFRA, PMS2*, POLD1*, POLE*, POT1*, PRKAR1A*, PTCH1*, PTEN*, RAD51C*, RAD51D*, RB1*, RET, SDHA*, SDHAF2*, SDHB*, SDHC*, SDHD*, SMAD4*, SMARCA4*, SMARCB1*, SMARCE1*, STK11*, SUFU*, TMEM127*, TP53*, TSC1*, TSC2*, VHL*. RNA analysis is performed for * genes.   10/20/2022 -  Chemotherapy   Patient is on Treatment Plan : COLORECTAL FOLFIRI q14d        INTERVAL HISTORY:  Joseph Haney was contacted for a follow up of recurrent colon cancer. He was last seen by me on 12/29/2022. Pt was in the hospital for 3 days for Campylobacter enteritis, and he recover very well.   All other systems were reviewed with the patient and are negative.  MEDICAL HISTORY:  Past Medical History:  Diagnosis Date   Alcohol abuse    Cancer (HCC)    GERD (gastroesophageal reflux disease)    Seizure (HCC)     SURGICAL HISTORY: Past Surgical History:  Procedure Laterality Date   BIOPSY  07/22/2021   Procedure: BIOPSY;  Surgeon: Kathi Der, MD;  Location: [facility];  Service: Gastroenterology;;   COLON SURGERY     COLONOSCOPY WITH PROPOFOL N/A 07/22/2021   Procedure: COLONOSCOPY WITH PROPOFOL;  Surgeon: Kathi Der, MD;  Location:  [facility];  Service: Gastroenterology;  Laterality: N/A;   IR IMAGING GUIDED PORT INSERTION  10/17/2022   NO PAST SURGERIES     SUBMUCOSAL TATTOO INJECTION  07/22/2021   Procedure: SUBMUCOSAL TATTOO INJECTION;  Surgeon:  Kathi Der, MD;  Location: [facility];  Service: Gastroenterology;;    I have reviewed the social history and family history with the patient and they are unchanged from previous note.  ALLERGIES:  is allergic to latex and morphine and codeine.  MEDICATIONS:  Current Outpatient Medications  Medication Sig Dispense Refill   carvedilol (COREG) 25 MG tablet Take 1 tablet (25 mg total) by mouth 2 (two) times daily with a meal. 30 tablet 0   cyclobenzaprine (FLEXERIL) 10 MG tablet Take 10-20 mg by mouth 2 (two) times daily as needed for muscle spasms.     dexamethasone (DECADRON) 4 MG tablet Take 1 tablet (4 mg total) by mouth daily. Take for 3-5 days after chemo for nausea and fatigue 20 tablet 1   diphenoxylate-atropine (LOMOTIL) 2.5-0.025 MG tablet Take 1 tablet by mouth 4 (four) times daily as needed for diarrhea or loose stools. (Patient not taking: Reported on 01/07/2023) 30 tablet 0   escitalopram (LEXAPRO) 10 MG tablet Take 10 mg by mouth daily.     ferrous sulfate 325 (65 FE) MG tablet Take 1 tablet (325 mg total) by mouth 2 (two) times daily with a meal. (Patient not taking: Reported on 01/07/2023) 100 tablet 3   gabapentin (NEURONTIN) 300 MG capsule Take 300 mg by mouth 3 (three) times daily.     HYDROcodone-acetaminophen (NORCO/VICODIN) 5-325 MG tablet Take 1 tablet by mouth every 6 (six) hours as needed for moderate pain.     lidocaine-prilocaine (EMLA) cream Apply to affected area once 30 g 3   magic mouthwash (nystatin, lidocaine, diphenhydrAMINE, alum & mag hydroxide) suspension Take 5 mLs by mouth 3 (three) times daily as needed for mouth pain. (Patient not taking: Reported on 01/07/2023) 140 mL 1   ondansetron (ZOFRAN) 8 MG tablet Take 1 tablet (8 mg total) by mouth every 8 (eight) hours as needed for nausea, vomiting or refractory nausea / vomiting. Start on the third day after chemotherapy. 30 tablet 1   oxyCODONE (ROXICODONE) 5 MG immediate release tablet Take 1 tablet (5 mg  total) by mouth every 6 (six) hours as needed for severe pain. (Patient not taking: Reported on 01/07/2023) 20 tablet 0   Oxycodone HCl 10 MG TABS Take 10 mg by mouth every 6 (six) hours as needed.     polyethylene glycol (MIRALAX / GLYCOLAX) 17 g packet Take 17 g by mouth daily as needed for mild constipation or moderate constipation. (Patient not taking: Reported on 01/07/2023) 14 each 0   potassium chloride SA (KLOR-CON M) 20 MEQ tablet Take 1 tablet (20 mEq total) by mouth daily for 5 days. (Patient not taking: Reported on 01/07/2023) 5 tablet 0   prochlorperazine (COMPAZINE) 10 MG tablet Take 1 tablet (10 mg total) by mouth every 6 (six) hours as needed for nausea or vomiting. 30 tablet 1   promethazine (PHENERGAN) 25 MG tablet Take 1 tablet (25 mg total) by mouth every 6 (six) hours as needed for nausea or vomiting. 30 tablet 1   No current facility-administered medications for this visit.    PHYSICAL EXAMINATION: ECOG PERFORMANCE STATUS: 1 - Symptomatic but completely ambulatory  There were no vitals filed for this visit. Wt Readings from Last 3 Encounters:  01/10/23 182 lb 5.1 oz (82.7  kg)  12/29/22 190 lb 3.2 oz (86.3 kg)  12/15/22 185 lb 4.8 oz (84.1 kg)     No vitals taken today, Exam not performed today  LABORATORY DATA:  I have reviewed the data as listed    Latest Ref Rng & Units 01/10/2023    5:41 AM 01/09/2023    8:07 AM 01/08/2023    5:00 AM  CBC  WBC 4.0 - 10.5 K/uL 7.9  7.3  5.8   Hemoglobin 13.0 - 17.0 g/dL 9.1  9.0  16.1   Hematocrit 39.0 - 52.0 % 27.7  27.6  41.1   Platelets 150 - 400 K/uL 272  250  198         Latest Ref Rng & Units 01/10/2023    5:41 AM 01/09/2023    7:05 AM 01/08/2023    5:00 AM  CMP  Glucose 70 - 99 mg/dL 096  045  409   BUN 6 - 20 mg/dL 6  6  6    Creatinine 0.61 - 1.24 mg/dL 8.11  9.14  7.82   Sodium 135 - 145 mmol/L 138  138  135   Potassium 3.5 - 5.1 mmol/L 3.5  3.2  3.0   Chloride 98 - 111 mmol/L 106  104  104   CO2 22 - 32 mmol/L 25   26  24    Calcium 8.9 - 10.3 mg/dL 8.4  8.3  7.8       RADIOGRAPHIC STUDIES: I have personally reviewed the radiological images as listed and agreed with the findings in the report. No results found.    No orders of the defined types were placed in this encounter.  All questions were answered. The patient knows to call the clinic with any problems, questions or concerns. No barriers to learning was detected. The total time spent in the appointment was 15 minutes.     Malachy Mood, MD 01/16/2023   Carolin Coy am acting as scribe for Malachy Mood, MD.   I have reviewed the above documentation for accuracy and completeness, and I agree with the above.

## 2023-01-16 NOTE — Assessment & Plan Note (Signed)
Stage IIIC (pT4a, pN2b, cM0), MSS, KRAS/NRAS/BRAF wild type -diagnosed in 07/2021 -s/p partial colectomy by Dr. Derrell Lolling 07/23/21, final path showed: pT4a lesion with LVI and PNI, no perforation; clear margins; 9/19 positive LNs, pN2b --He received Capox 09/11/21 - 01/15/22, oxali discontinued due to significant nausea/vomiting and headaches. He completed Xeloda in late June 2023.  -He unfortunately developed oligo liver metastasis based on PET/CT in 08/2022, liver biopsy was attempted but did not feasible.  -Guardian review was positive for circulating tumor DNA, consistent with recurrence. -Patient requested second opinion of surgery, was seen at [facility].  Neoadjuvant chemotherapy recommended before surgical resection, and he agreed. -He started chemotherapy FOLFIRI on 10/20/2022, no bevacizumab due to pending surgery  -NGS Tempus showed no targetable therapy, KRAS/NRAS/BRAF wild type, would not recommend EGFR inhibitor for first line due to his primary right side colon cancer. -He was recently hospitalized for Campylobacter enteritis, he has recovered well.

## 2023-01-30 DIAGNOSIS — C787 Secondary malignant neoplasm of liver and intrahepatic bile duct: Secondary | ICD-10-CM | POA: Diagnosis not present

## 2023-01-30 DIAGNOSIS — J9811 Atelectasis: Secondary | ICD-10-CM | POA: Diagnosis not present

## 2023-01-30 DIAGNOSIS — C189 Malignant neoplasm of colon, unspecified: Secondary | ICD-10-CM | POA: Diagnosis not present

## 2023-01-30 DIAGNOSIS — R911 Solitary pulmonary nodule: Secondary | ICD-10-CM | POA: Diagnosis not present

## 2023-01-30 DIAGNOSIS — C19 Malignant neoplasm of rectosigmoid junction: Secondary | ICD-10-CM | POA: Diagnosis not present

## 2023-02-03 ENCOUNTER — Other Ambulatory Visit: Payer: Self-pay

## 2023-02-09 ENCOUNTER — Other Ambulatory Visit: Payer: Self-pay | Admitting: Hematology

## 2023-02-09 ENCOUNTER — Telehealth: Payer: Self-pay

## 2023-02-09 DIAGNOSIS — C182 Malignant neoplasm of ascending colon: Secondary | ICD-10-CM

## 2023-02-09 NOTE — Telephone Encounter (Signed)
Spoke with pt regarding missed MRI appt at [facility].  Stated that Dr. Mosetta Putt asked is this RN contact pt to see why pt missed MRI appt at [facility] and if the pt is willing to come in this week for a MRI here.  Pt is scheduled for surgery at [facility] on Thursday but if MRI is not completed, pt will be rescheduled until MRI is completed.  Pt stated he was at [facility] but they checked him into his appt with the provider first therefore, pt missed the MRI appt.  Pt stated they rescheduled the MRI appt to 03/05/2023.  Asked if pt is willing to come in this week before Thursday to have a MRI done here at [facility].  Pt stated he's unable to come this week d/t personal matters going on at home involving his children.  Pt stated he will just keep the appt at [facility] for 03/05/2023 and they will reschedule his surgery at [facility] to after he's completed the MRI.  This RN verbalized understanding and notified Dr. Mosetta Putt of the pt's situation.  Dr. Mosetta Putt notified surgeon at [facility] of this RN's conversation with pt.

## 2023-02-10 DIAGNOSIS — F1910 Other psychoactive substance abuse, uncomplicated: Secondary | ICD-10-CM | POA: Diagnosis not present

## 2023-02-10 DIAGNOSIS — M5416 Radiculopathy, lumbar region: Secondary | ICD-10-CM | POA: Diagnosis not present

## 2023-02-10 DIAGNOSIS — M542 Cervicalgia: Secondary | ICD-10-CM | POA: Diagnosis not present

## 2023-02-10 DIAGNOSIS — G894 Chronic pain syndrome: Secondary | ICD-10-CM | POA: Diagnosis not present

## 2023-02-10 DIAGNOSIS — C189 Malignant neoplasm of colon, unspecified: Secondary | ICD-10-CM | POA: Diagnosis not present

## 2023-02-10 DIAGNOSIS — Z5181 Encounter for therapeutic drug level monitoring: Secondary | ICD-10-CM | POA: Diagnosis not present

## 2023-02-10 DIAGNOSIS — M5136 Other intervertebral disc degeneration, lumbar region: Secondary | ICD-10-CM | POA: Diagnosis not present

## 2023-02-10 DIAGNOSIS — Z79891 Long term (current) use of opiate analgesic: Secondary | ICD-10-CM | POA: Diagnosis not present

## 2023-02-10 DIAGNOSIS — C229 Malignant neoplasm of liver, not specified as primary or secondary: Secondary | ICD-10-CM | POA: Diagnosis not present

## 2023-02-10 DIAGNOSIS — G8929 Other chronic pain: Secondary | ICD-10-CM | POA: Diagnosis not present

## 2023-02-12 ENCOUNTER — Other Ambulatory Visit: Payer: Self-pay

## 2023-02-13 ENCOUNTER — Ambulatory Visit (HOSPITAL_COMMUNITY)
Admission: RE | Admit: 2023-02-13 | Discharge: 2023-02-13 | Disposition: A | Discharge status: Home / Self Care | Payer: Medicaid Other | Source: Ambulatory Visit | Attending: Hematology | Admitting: Hematology

## 2023-02-13 ENCOUNTER — Other Ambulatory Visit: Payer: Self-pay

## 2023-02-13 DIAGNOSIS — C182 Malignant neoplasm of ascending colon: Secondary | ICD-10-CM

## 2023-02-13 DIAGNOSIS — C189 Malignant neoplasm of colon, unspecified: Secondary | ICD-10-CM | POA: Diagnosis not present

## 2023-02-13 MED ORDER — GADOBUTROL 1 MMOL/ML IV SOLN
8.0000 mL | Freq: Once | INTRAVENOUS | Status: AC | PRN
  Administered 2023-02-13: 8 mL via INTRAVENOUS

## 2023-02-16 ENCOUNTER — Other Ambulatory Visit: Payer: Self-pay

## 2023-02-19 ENCOUNTER — Ambulatory Visit (HOSPITAL_BASED_OUTPATIENT_CLINIC_OR_DEPARTMENT_OTHER)
Admission: RE | Admit: 2023-02-19 | Discharge: 2023-02-19 | Disposition: A | Discharge status: Home / Self Care | Payer: Medicaid Other | Source: Ambulatory Visit | Attending: Hematology | Admitting: Hematology

## 2023-02-19 DIAGNOSIS — C787 Secondary malignant neoplasm of liver and intrahepatic bile duct: Secondary | ICD-10-CM | POA: Diagnosis not present

## 2023-02-19 DIAGNOSIS — C801 Malignant (primary) neoplasm, unspecified: Secondary | ICD-10-CM | POA: Diagnosis not present

## 2023-02-19 DIAGNOSIS — C189 Malignant neoplasm of colon, unspecified: Secondary | ICD-10-CM | POA: Diagnosis not present

## 2023-02-19 DIAGNOSIS — C182 Malignant neoplasm of ascending colon: Secondary | ICD-10-CM | POA: Insufficient documentation

## 2023-02-19 DIAGNOSIS — C785 Secondary malignant neoplasm of large intestine and rectum: Secondary | ICD-10-CM | POA: Diagnosis not present

## 2023-02-19 MED ORDER — IOHEXOL 300 MG/ML  SOLN
100.0000 mL | Freq: Once | INTRAMUSCULAR | Status: AC | PRN
  Administered 2023-02-19: 100 mL via INTRAVENOUS

## 2023-02-24 ENCOUNTER — Other Ambulatory Visit: Payer: Self-pay

## 2023-03-09 ENCOUNTER — Other Ambulatory Visit: Payer: Self-pay

## 2023-03-10 ENCOUNTER — Other Ambulatory Visit: Payer: Self-pay

## 2023-03-10 DIAGNOSIS — M5416 Radiculopathy, lumbar region: Secondary | ICD-10-CM | POA: Diagnosis not present

## 2023-03-10 DIAGNOSIS — G894 Chronic pain syndrome: Secondary | ICD-10-CM | POA: Diagnosis not present

## 2023-03-10 DIAGNOSIS — C189 Malignant neoplasm of colon, unspecified: Secondary | ICD-10-CM | POA: Diagnosis not present

## 2023-03-10 DIAGNOSIS — M542 Cervicalgia: Secondary | ICD-10-CM | POA: Diagnosis not present

## 2023-03-10 DIAGNOSIS — G8929 Other chronic pain: Secondary | ICD-10-CM | POA: Diagnosis not present

## 2023-03-10 DIAGNOSIS — M5136 Other intervertebral disc degeneration, lumbar region: Secondary | ICD-10-CM | POA: Diagnosis not present

## 2023-03-10 DIAGNOSIS — C229 Malignant neoplasm of liver, not specified as primary or secondary: Secondary | ICD-10-CM | POA: Diagnosis not present

## 2023-03-20 DIAGNOSIS — C787 Secondary malignant neoplasm of liver and intrahepatic bile duct: Secondary | ICD-10-CM | POA: Diagnosis not present

## 2023-04-07 DIAGNOSIS — Z79891 Long term (current) use of opiate analgesic: Secondary | ICD-10-CM | POA: Diagnosis not present

## 2023-04-07 DIAGNOSIS — G8929 Other chronic pain: Secondary | ICD-10-CM | POA: Diagnosis not present

## 2023-04-07 DIAGNOSIS — M542 Cervicalgia: Secondary | ICD-10-CM | POA: Diagnosis not present

## 2023-04-07 DIAGNOSIS — C229 Malignant neoplasm of liver, not specified as primary or secondary: Secondary | ICD-10-CM | POA: Diagnosis not present

## 2023-04-07 DIAGNOSIS — G894 Chronic pain syndrome: Secondary | ICD-10-CM | POA: Diagnosis not present

## 2023-04-07 DIAGNOSIS — M5416 Radiculopathy, lumbar region: Secondary | ICD-10-CM | POA: Diagnosis not present

## 2023-04-07 DIAGNOSIS — C189 Malignant neoplasm of colon, unspecified: Secondary | ICD-10-CM | POA: Diagnosis not present

## 2023-04-07 DIAGNOSIS — M5136 Other intervertebral disc degeneration, lumbar region: Secondary | ICD-10-CM | POA: Diagnosis not present

## 2023-04-15 DIAGNOSIS — C787 Secondary malignant neoplasm of liver and intrahepatic bile duct: Secondary | ICD-10-CM | POA: Diagnosis not present

## 2023-04-16 DIAGNOSIS — C189 Malignant neoplasm of colon, unspecified: Secondary | ICD-10-CM | POA: Diagnosis not present

## 2023-04-16 DIAGNOSIS — M899 Disorder of bone, unspecified: Secondary | ICD-10-CM | POA: Diagnosis not present

## 2023-04-16 DIAGNOSIS — C787 Secondary malignant neoplasm of liver and intrahepatic bile duct: Secondary | ICD-10-CM | POA: Diagnosis not present

## 2023-04-20 ENCOUNTER — Other Ambulatory Visit: Payer: Self-pay

## 2023-04-20 DIAGNOSIS — C189 Malignant neoplasm of colon, unspecified: Secondary | ICD-10-CM

## 2023-04-20 DIAGNOSIS — C182 Malignant neoplasm of ascending colon: Secondary | ICD-10-CM

## 2023-04-22 ENCOUNTER — Inpatient Hospital Stay: Payer: Medicaid Other | Admitting: Hematology

## 2023-04-22 ENCOUNTER — Inpatient Hospital Stay: Payer: Medicaid Other | Attending: Nurse Practitioner

## 2023-04-22 ENCOUNTER — Telehealth: Payer: Self-pay | Admitting: Hematology

## 2023-04-22 ENCOUNTER — Other Ambulatory Visit: Payer: Self-pay

## 2023-04-22 VITALS — BP 144/87 | HR 91 | Temp 98.4°F | Resp 20 | Wt 182.5 lb

## 2023-04-22 DIAGNOSIS — C787 Secondary malignant neoplasm of liver and intrahepatic bile duct: Secondary | ICD-10-CM | POA: Diagnosis not present

## 2023-04-22 DIAGNOSIS — Z9049 Acquired absence of other specified parts of digestive tract: Secondary | ICD-10-CM | POA: Diagnosis not present

## 2023-04-22 DIAGNOSIS — Z9221 Personal history of antineoplastic chemotherapy: Secondary | ICD-10-CM | POA: Diagnosis not present

## 2023-04-22 DIAGNOSIS — D500 Iron deficiency anemia secondary to blood loss (chronic): Secondary | ICD-10-CM

## 2023-04-22 DIAGNOSIS — C182 Malignant neoplasm of ascending colon: Secondary | ICD-10-CM

## 2023-04-22 DIAGNOSIS — C189 Malignant neoplasm of colon, unspecified: Secondary | ICD-10-CM

## 2023-04-22 LAB — CBC WITH DIFFERENTIAL (CANCER CENTER ONLY)
Abs Immature Granulocytes: 0.01 10*3/uL (ref 0.00–0.07)
Basophils Absolute: 0 10*3/uL (ref 0.0–0.1)
Basophils Relative: 1 %
Eosinophils Absolute: 0.2 10*3/uL (ref 0.0–0.5)
Eosinophils Relative: 4 %
HCT: 41.4 % (ref 39.0–52.0)
Hemoglobin: 13.3 g/dL (ref 13.0–17.0)
Immature Granulocytes: 0 %
Lymphocytes Relative: 42 %
Lymphs Abs: 2.3 10*3/uL (ref 0.7–4.0)
MCH: 26.9 pg (ref 26.0–34.0)
MCHC: 32.1 g/dL (ref 30.0–36.0)
MCV: 83.8 fL (ref 80.0–100.0)
Monocytes Absolute: 0.3 10*3/uL (ref 0.1–1.0)
Monocytes Relative: 5 %
Neutro Abs: 2.7 10*3/uL (ref 1.7–7.7)
Neutrophils Relative %: 48 %
Platelet Count: 286 10*3/uL (ref 150–400)
RBC: 4.94 MIL/uL (ref 4.22–5.81)
RDW: 12.8 % (ref 11.5–15.5)
WBC Count: 5.6 10*3/uL (ref 4.0–10.5)
nRBC: 0 % (ref 0.0–0.2)

## 2023-04-22 LAB — CMP (CANCER CENTER ONLY)
ALT: 10 U/L (ref 0–44)
AST: 19 U/L (ref 15–41)
Albumin: 4.6 g/dL (ref 3.5–5.0)
Alkaline Phosphatase: 174 U/L — ABNORMAL HIGH (ref 38–126)
Anion gap: 9 (ref 5–15)
BUN: 11 mg/dL (ref 6–20)
CO2: 28 mmol/L (ref 22–32)
Calcium: 9.7 mg/dL (ref 8.9–10.3)
Chloride: 101 mmol/L (ref 98–111)
Creatinine: 0.77 mg/dL (ref 0.61–1.24)
GFR, Estimated: >60 mL/min (ref >60)
Glucose, Bld: 97 mg/dL (ref 70–99)
Potassium: 3.9 mmol/L (ref 3.5–5.1)
Sodium: 138 mmol/L (ref 135–145)
Total Bilirubin: 0.4 mg/dL (ref 0.3–1.2)
Total Protein: 8.8 g/dL — ABNORMAL HIGH (ref 6.5–8.1)

## 2023-04-22 MED ORDER — POTASSIUM CHLORIDE CRYS ER 20 MEQ PO TBCR: 20.0000 meq | EXTENDED_RELEASE_TABLET | Freq: Every day | ORAL | 0 refills | Status: DC

## 2023-04-22 NOTE — Addendum Note (Signed)
Addended by: Malachy Mood on: 04/22/2023 09:17 PM   Modules accepted: Orders

## 2023-04-22 NOTE — Assessment & Plan Note (Signed)
Stage IIIC (pT4a, pN2b, cM0), MSS, KRAS/NRAS/BRAF wild type -diagnosed in 07/2021 -s/p partial colectomy by Dr. Derrell Lolling 07/23/21, final path showed: pT4a lesion with LVI and PNI, no perforation; clear margins; 9/19 positive LNs, pN2b --He received Capox 09/11/21 - 01/15/22, oxali discontinued due to significant nausea/vomiting and headaches. He completed Xeloda in late June 2023.  -He unfortunately developed oligo liver metastasis based on PET/CT in 08/2022, liver biopsy was attempted but did not feasible.  -Guardian review was positive for circulating tumor DNA, consistent with recurrence. -Patient requested second opinion of surgery, was seen at [facility].  Neoadjuvant chemotherapy recommended before surgical resection, and he agreed. -He started chemotherapy FOLFIRI on 10/20/2022, no bevacizumab due to pending surgery  -NGS Tempus showed no targetable therapy, KRAS/NRAS/BRAF wild type, would not recommend EGFR inhibitor for first line due to his primary right side colon cancer. -he completed 3 months for very in April 2024.  He was referred to liver surgeon Dr. Gwenlyn Perking at [facility], for patient's personal issue, his surgery has been postponed until recently.  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  I will obtain a hip MRI for further evaluation -Depend on the MRI findings, we will decide if he needs a PET scan or bone biopsy -I discussed restarting chemotherapy if he has confirmed bone metastasis.  His liver metastasis has progressed also on recent CT scan, but still resectable.

## 2023-04-22 NOTE — Progress Notes (Signed)
[facility]   Telephone:(336) [phone] [phone]   Clinic Follow up Note   Patient Care Team: Frederic Jericho, [name] as PCP - General (Physician Assistant) Axel Filler, MD as Consulting Physician (General Surgery) Pollyann Samples, NP as Nurse Practitioner (Nurse Practitioner) Malachy Mood, MD as Consulting Physician (Hematology) Ewing Schlein, MD as Referring Physician (Pain Medicine)  Date of Service:  04/22/2023  CHIEF COMPLAINT: f/u of recurrent colon cancer   CURRENT THERAPY:  Pending workup  ASSESSMENT:  Joseph Haney is a 47 y.o. 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 Derrell Lolling 07/23/21, final path showed: pT4a lesion with LVI and PNI, no perforation; clear margins; 9/19 positive LNs, pN2b --He received Capox 09/11/21 - 01/15/22, oxali discontinued due to significant nausea/vomiting and headaches. He completed Xeloda in late June 2023.  -He unfortunately developed oligo liver metastasis based on PET/CT in 08/2022, liver biopsy was attempted but did not feasible.  -Guardian review was positive for circulating tumor DNA, consistent with recurrence. -Patient requested second opinion of surgery, was seen at [facility].  Neoadjuvant chemotherapy recommended before surgical resection, and he agreed. -He started chemotherapy FOLFIRI on 10/20/2022, no bevacizumab due to pending surgery  -NGS Tempus showed no targetable therapy, KRAS/NRAS/BRAF wild type, would not recommend EGFR inhibitor for first line due to his primary right side colon cancer. -he completed 3 months for very in April 2024.  He was referred to liver surgeon Dr. Gwenlyn Perking at [facility], for patient's personal issue, his surgery has been postponed until recently.  Unfortunately his recent CT scan on 04/16/23 at [facility] showed possible bone metastasis at right  Acetabulum, his liver resection was  canceled.  I will obtain a hip MRI for further evaluation -Depend on the MRI findings, we will decide if he needs a PET scan or bone biopsy -I discussed restarting chemotherapy if he has confirmed bone metastasis.  His liver metastasis has progressed also on recent CT scan, but still resectable.     PLAN: -I order MRI of the HIP area. - I refill potassium -I will call him after MRI   SUMMARY OF ONCOLOGIC HISTORY: Oncology History Overview Note   Cancer Staging  Cancer of right colon [facility]) Staging form: Colon and Rectum, AJCC 8th Edition - Pathologic stage from 07/23/2021: Stage IIIC (pT4a, pN2b, cM0) - Signed by Pollyann Samples, NP on 08/15/2021 Stage prefix: Initial diagnosis Histologic grading system: 4 grade system Histologic grade (G): G2 Laterality: Right Lymph-vascular invasion (LVI): LVI present/identified, NOS Perineural invasion (PNI): Present Microsatellite instability (MSI): Stable     Cancer of right colon (HCC)  07/19/2021 Imaging   CT AP IMPRESSION: Focal area of high-grade narrowing of the proximal ascending colon may be sequela of chronic inflammation and stricture versus a mass. There is mild distension of the cecum and terminal ileum. Clinical correlation and further evaluation with lower GI study or colonoscopy recommended.   07/22/2021 Procedure   Colonoscopy A severe stenosis was found in the distal ascending colon and was non-traversed. It was ulcerated and inflamed. This could be inflammatory versus malignant stricture.   07/22/2021 Initial Biopsy   FINAL MICROSCOPIC DIAGNOSIS:  A. COLON, ASCENDING, BIOPSY:  - Invasive colorectal adenocarcinoma, moderately differentiated.    07/23/2021 Definitive Surgery   PRE-OPERATIVE DIAGNOSIS:  right colon mass POST-OPERATIVE DIAGNOSIS:  right colon mass PROCEDURE:  Procedure(s): XI ROBOT ASSISTED LAPAROSCOPIC RIGHT COLECTOMY (N/A) Dr. Derrell Lolling  07/23/2021 Pathology Results   FINAL MICROSCOPIC DIAGNOSIS:   A. COLON, RIGHT, RESECTION:  - Invasive moderately differentiated colonic adenocarcinoma, 4.5 cm.  - Carcinoma extends into pericolonic connective tissue and focally  involves serosal surface.  - Margins not involved.  - Metastatic carcinoma in nine of nineteen lymph nodes (9/19).  - Unremarkable appendix. MMR normal, MSI-stable    07/23/2021 Cancer Staging   Staging form: Colon and Rectum, AJCC 8th Edition - Pathologic stage from 07/23/2021: Stage IIIC (pT4a, pN2b, cM0) - Signed by Pollyann Samples, NP on 08/15/2021 Stage prefix: Initial diagnosis Histologic grading system: 4 grade system Histologic grade (G): G2 Laterality: Right Lymph-vascular invasion (LVI): LVI present/identified, NOS Perineural invasion (PNI): Present Microsatellite instability (MSI): Stable   07/24/2021 Initial Diagnosis   Colon adenocarcinoma (HCC)   07/25/2021 Tumor Marker   CEA: 2.9   07/28/2021 Imaging   CT chest IMPRESSION: 1. No evidence of metastatic disease within the chest. 2. Lungs demonstrate atelectasis, most evident lower lobes and bases of the left upper lobe lingula and right middle lobe. No convincing pneumonia and no evidence of pulmonary edema.     09/11/2021 - 01/15/2022 Chemotherapy   Patient is on Treatment Plan : COLORECTAL Xelox (Capeox) q21d     08/2022 Progression   PET scan showed hypermetabolic oligo liver metastasis, biopsy attempted but liver lesion was not seen by [place]. GuardantReveal was also positive for circulating tumor DNA    09/02/2022 - 09/02/2022 Chemotherapy   Patient is on Treatment Plan : COLORECTAL FOLFIRI + Bevacizumab q14d      Genetic Testing   Negative genetic testing. No pathogenic variants identified on the [facility] Multi-Cancer+RNA panel. VUS in APC called [id number] identified. The report date is 09/15/2022.  The Multi-Cancer + RNA Panel offered by [facility] includes sequencing and/or deletion/duplication analysis of the following 70 genes:  AIP*, ALK,  APC*, ATM*, AXIN2*, BAP1*, BARD1*, BLM*, BMPR1A*, BRCA1*, BRCA2*, BRIP1*, CDC73*, CDH1*, CDK4, CDKN1B*, CDKN2A, CHEK2*, CTNNA1*, DICER1*, EPCAM, EGFR, FH*, FLCN*, GREM1, HOXB13, KIT, LZTR1, MAX*, MBD4, MEN1*, MET, MITF, MLH1*, MSH2*, MSH3*, MSH6*, MUTYH*, NF1*, NF2*, NTHL1*, PALB2*, PDGFRA, PMS2*, POLD1*, POLE*, POT1*, PRKAR1A*, PTCH1*, PTEN*, RAD51C*, RAD51D*, RB1*, RET, SDHA*, SDHAF2*, SDHB*, SDHC*, SDHD*, SMAD4*, SMARCA4*, SMARCB1*, SMARCE1*, STK11*, SUFU*, TMEM127*, TP53*, TSC1*, TSC2*, VHL*. RNA analysis is performed for * genes.   10/20/2022 -  Chemotherapy   Patient is on Treatment Plan : COLORECTAL FOLFIRI q14d        INTERVAL HISTORY:  Joseph Haney is here for a follow up of  recurrent colon cancer.He was last seen by me on 01/16/2023. He presents to the clinic alone. Pt state that his wife is in his ICU. Pt state that he has no pain in his hip.    All other systems were reviewed with the patient and are negative.  MEDICAL HISTORY:  Past Medical History:  Diagnosis Date   Alcohol abuse    Cancer (HCC)    GERD (gastroesophageal reflux disease)    Seizure (HCC)     SURGICAL HISTORY: Past Surgical History:  Procedure Laterality Date   BIOPSY  07/22/2021   Procedure: BIOPSY;  Surgeon: Kathi Der, MD;  Location: [facility];  Service: Gastroenterology;;   COLON SURGERY     COLONOSCOPY WITH PROPOFOL N/A 07/22/2021   Procedure: COLONOSCOPY WITH PROPOFOL;  Surgeon: Kathi Der, MD;  Location: [facility];  Service: Gastroenterology;  Laterality: N/A;   IR IMAGING GUIDED PORT INSERTION  10/17/2022   NO PAST SURGERIES     SUBMUCOSAL TATTOO INJECTION  07/22/2021   Procedure: SUBMUCOSAL TATTOO INJECTION;  Surgeon: Kathi Der, MD;  Location: [facility];  Service: Gastroenterology;;    I have reviewed the social history and family history with the patient and they are unchanged from previous note.  ALLERGIES:  is allergic to latex and morphine and  codeine.  MEDICATIONS:  Current Outpatient Medications  Medication Sig Dispense Refill   carvedilol (COREG) 25 MG tablet Take 1 tablet (25 mg total) by mouth 2 (two) times daily with a meal. 30 tablet 0   cyclobenzaprine (FLEXERIL) 10 MG tablet Take 10-20 mg by mouth 2 (two) times daily as needed for muscle spasms.     dexamethasone (DECADRON) 4 MG tablet Take 1 tablet (4 mg total) by mouth daily. Take for 3-5 days after chemo for nausea and fatigue 20 tablet 1   diphenoxylate-atropine (LOMOTIL) 2.5-0.025 MG tablet Take 1 tablet by mouth 4 (four) times daily as needed for diarrhea or loose stools. (Patient not taking: Reported on 01/07/2023) 30 tablet 0   escitalopram (LEXAPRO) 10 MG tablet Take 10 mg by mouth daily.     ferrous sulfate 325 (65 FE) MG tablet Take 1 tablet (325 mg total) by mouth 2 (two) times daily with a meal. (Patient not taking: Reported on 01/07/2023) 100 tablet 3   gabapentin (NEURONTIN) 300 MG capsule Take 300 mg by mouth 3 (three) times daily.     HYDROcodone-acetaminophen (NORCO/VICODIN) 5-325 MG tablet Take 1 tablet by mouth every 6 (six) hours as needed for moderate pain.     lidocaine-prilocaine (EMLA) cream Apply to affected area once 30 g 3   magic mouthwash (nystatin, lidocaine, diphenhydrAMINE, alum & mag hydroxide) suspension Take 5 mLs by mouth 3 (three) times daily as needed for mouth pain. (Patient not taking: Reported on 01/07/2023) 140 mL 1   ondansetron (ZOFRAN) 8 MG tablet Take 1 tablet (8 mg total) by mouth every 8 (eight) hours as needed for nausea, vomiting or refractory nausea / vomiting. Start on the third day after chemotherapy. 30 tablet 1   oxyCODONE (ROXICODONE) 5 MG immediate release tablet Take 1 tablet (5 mg total) by mouth every 6 (six) hours as needed for severe pain. (Patient not taking: Reported on 01/07/2023) 20 tablet 0   Oxycodone HCl 10 MG TABS Take 10 mg by mouth every 6 (six) hours as needed.     polyethylene glycol (MIRALAX / GLYCOLAX) 17 g  packet Take 17 g by mouth daily as needed for mild constipation or moderate constipation. (Patient not taking: Reported on 01/07/2023) 14 each 0   potassium chloride SA (KLOR-CON M) 20 MEQ tablet Take 1 tablet (20 mEq total) by mouth daily for 5 days. 30 tablet 0   prochlorperazine (COMPAZINE) 10 MG tablet Take 1 tablet (10 mg total) by mouth every 6 (six) hours as needed for nausea or vomiting. 30 tablet 1   promethazine (PHENERGAN) 25 MG tablet Take 1 tablet (25 mg total) by mouth every 6 (six) hours as needed for nausea or vomiting. 30 tablet 1   No current facility-administered medications for this visit.    PHYSICAL EXAMINATION: ECOG PERFORMANCE STATUS: 1 - Symptomatic but completely ambulatory  Vitals:   04/22/23 1400  BP: (!) 144/87  Pulse: 91  Resp: 20  Temp: 98.4 F (36.9 C)  SpO2: 100%   Wt Readings from Last 3 Encounters:  04/22/23 182 lb 8 oz (82.8 kg)  01/10/23 182 lb 5.1 oz (82.7 kg)  12/29/22 190 lb 3.2 oz (86.3 kg)  GENERAL:alert, no distress and comfortable SKIN: skin color normal, no rashes or significant lesions EYES: normal, Conjunctiva are pink and non-injected, sclera clear  NEURO: alert & oriented x 3 with fluent speech  LABORATORY DATA:  I have reviewed the data as listed    Latest Ref Rng & Units 04/22/2023    1:57 PM 01/10/2023    5:41 AM 01/09/2023    8:07 AM  CBC  WBC 4.0 - 10.5 K/uL 5.6  7.9  7.3   Hemoglobin 13.0 - 17.0 g/dL 56.3  9.1  9.0   Hematocrit 39.0 - 52.0 % 41.4  27.7  27.6   Platelets 150 - 400 K/uL 286  272  250         Latest Ref Rng & Units 04/22/2023    1:57 PM 01/10/2023    5:41 AM 01/09/2023    7:05 AM  CMP  Glucose 70 - 99 mg/dL 97  875  643   BUN 6 - 20 mg/dL 11  6  6    Creatinine 0.61 - 1.24 mg/dL 3.29  5.18  8.41   Sodium 135 - 145 mmol/L 138  138  138   Potassium 3.5 - 5.1 mmol/L 3.9  3.5  3.2   Chloride 98 - 111 mmol/L 101  106  104   CO2 22 - 32 mmol/L 28  25  26    Calcium 8.9 - 10.3 mg/dL 9.7  8.4  8.3    Total Protein 6.5 - 8.1 g/dL 8.8     Total Bilirubin 0.3 - 1.2 mg/dL 0.4     Alkaline Phos 38 - 126 U/L 174     AST 15 - 41 U/L 19     ALT 0 - 44 U/L 10         RADIOGRAPHIC STUDIES: I have personally reviewed the radiological images as listed and agreed with the findings in the report. No results found.    No orders of the defined types were placed in this encounter.  All questions were answered. The patient knows to call the clinic with any problems, questions or concerns. No barriers to learning was detected. The total time spent in the appointment was 20 minutes.     Malachy Mood, MD 04/22/2023   Carolin Coy, CMA, am acting as scribe for Malachy Mood, MD.   I have reviewed the above documentation for accuracy and completeness, and I agree with the above.

## 2023-04-23 ENCOUNTER — Inpatient Hospital Stay: Payer: Medicaid Other | Admitting: Hematology

## 2023-04-23 ENCOUNTER — Inpatient Hospital Stay: Payer: Medicaid Other

## 2023-04-26 ENCOUNTER — Other Ambulatory Visit: Payer: Self-pay

## 2023-04-29 ENCOUNTER — Ambulatory Visit (HOSPITAL_COMMUNITY)
Admission: RE | Admit: 2023-04-29 | Discharge: 2023-04-29 | Disposition: A | Discharge status: Home / Self Care | Payer: Medicaid Other | Source: Ambulatory Visit | Attending: Hematology | Admitting: Hematology

## 2023-04-29 DIAGNOSIS — C182 Malignant neoplasm of ascending colon: Secondary | ICD-10-CM | POA: Diagnosis not present

## 2023-04-29 MED ORDER — GADOBUTROL 1 MMOL/ML IV SOLN
8.0000 mL | Freq: Once | INTRAVENOUS | Status: AC | PRN
  Administered 2023-04-29: 8 mL via INTRAVENOUS

## 2023-05-11 ENCOUNTER — Telehealth: Payer: Self-pay | Admitting: Hematology

## 2023-05-11 ENCOUNTER — Telehealth: Payer: Self-pay

## 2023-05-11 NOTE — Telephone Encounter (Signed)
Spoke with Joseph Haney via telephone to get him scheduled for a f/u appt to go over Joseph Haney's MRI results.  Joseph Haney stated he's not sure if he's able to come in this week b/c he buried his wife on 05/10/2023.  Joseph Haney stated he will try to come in but can do a telephone visit w/Dr. Mosetta Putt to go over the results.  Informed Joseph Haney that Dr. Latanya Maudlin scheduler will contact him to get him scheduled for a telephone or OV this week.  Joseph Haney verbalized understanding and had no further questions or concerns.

## 2023-05-12 ENCOUNTER — Inpatient Hospital Stay: Payer: Medicaid Other | Attending: Nurse Practitioner | Admitting: Hematology

## 2023-05-12 ENCOUNTER — Encounter: Payer: Self-pay | Admitting: Hematology

## 2023-05-12 ENCOUNTER — Other Ambulatory Visit: Payer: Self-pay

## 2023-05-12 ENCOUNTER — Other Ambulatory Visit (HOSPITAL_COMMUNITY): Payer: Self-pay

## 2023-05-12 ENCOUNTER — Inpatient Hospital Stay: Payer: Medicaid Other | Admitting: Licensed Clinical Social Worker

## 2023-05-12 ENCOUNTER — Telehealth: Payer: Self-pay | Admitting: Pharmacist

## 2023-05-12 ENCOUNTER — Telehealth: Payer: Self-pay | Admitting: Pharmacy Technician

## 2023-05-12 DIAGNOSIS — M542 Cervicalgia: Secondary | ICD-10-CM | POA: Diagnosis not present

## 2023-05-12 DIAGNOSIS — C229 Malignant neoplasm of liver, not specified as primary or secondary: Secondary | ICD-10-CM | POA: Diagnosis not present

## 2023-05-12 DIAGNOSIS — Z79899 Other long term (current) drug therapy: Secondary | ICD-10-CM | POA: Insufficient documentation

## 2023-05-12 DIAGNOSIS — Z5111 Encounter for antineoplastic chemotherapy: Secondary | ICD-10-CM | POA: Insufficient documentation

## 2023-05-12 DIAGNOSIS — R112 Nausea with vomiting, unspecified: Secondary | ICD-10-CM | POA: Insufficient documentation

## 2023-05-12 DIAGNOSIS — Z79891 Long term (current) use of opiate analgesic: Secondary | ICD-10-CM | POA: Diagnosis not present

## 2023-05-12 DIAGNOSIS — C182 Malignant neoplasm of ascending colon: Secondary | ICD-10-CM

## 2023-05-12 DIAGNOSIS — C7951 Secondary malignant neoplasm of bone: Secondary | ICD-10-CM | POA: Insufficient documentation

## 2023-05-12 DIAGNOSIS — Z7952 Long term (current) use of systemic steroids: Secondary | ICD-10-CM | POA: Insufficient documentation

## 2023-05-12 DIAGNOSIS — K219 Gastro-esophageal reflux disease without esophagitis: Secondary | ICD-10-CM | POA: Insufficient documentation

## 2023-05-12 DIAGNOSIS — F1011 Alcohol abuse, in remission: Secondary | ICD-10-CM | POA: Insufficient documentation

## 2023-05-12 DIAGNOSIS — M5416 Radiculopathy, lumbar region: Secondary | ICD-10-CM | POA: Diagnosis not present

## 2023-05-12 DIAGNOSIS — G40909 Epilepsy, unspecified, not intractable, without status epilepticus: Secondary | ICD-10-CM | POA: Insufficient documentation

## 2023-05-12 DIAGNOSIS — C189 Malignant neoplasm of colon, unspecified: Secondary | ICD-10-CM | POA: Diagnosis not present

## 2023-05-12 DIAGNOSIS — G894 Chronic pain syndrome: Secondary | ICD-10-CM | POA: Diagnosis not present

## 2023-05-12 DIAGNOSIS — C787 Secondary malignant neoplasm of liver and intrahepatic bile duct: Secondary | ICD-10-CM | POA: Insufficient documentation

## 2023-05-12 MED ORDER — PROCHLORPERAZINE MALEATE 10 MG PO TABS
10.0000 mg | ORAL_TABLET | Freq: Four times a day (QID) | ORAL | 1 refills | Status: DC | PRN
Start: 2023-05-12 — End: 2023-06-15

## 2023-05-12 MED ORDER — CAPECITABINE 500 MG PO TABS
850.0000 mg/m2 | ORAL_TABLET | Freq: Two times a day (BID) | ORAL | 1 refills | Status: DC
  Filled 2023-05-12: qty 84, 14d supply, fill #0

## 2023-05-12 MED ORDER — ONDANSETRON HCL 8 MG PO TABS
8.0000 mg | ORAL_TABLET | Freq: Three times a day (TID) | ORAL | 1 refills | Status: DC | PRN
Start: 2023-05-12 — End: 2023-06-15

## 2023-05-12 MED ORDER — LIDOCAINE-PRILOCAINE 2.5-2.5 % EX CREA
TOPICAL_CREAM | CUTANEOUS | 3 refills | Status: DC
Start: 2023-05-12 — End: 2023-05-22

## 2023-05-12 MED ORDER — DEXAMETHASONE 4 MG PO TABS
8.0000 mg | ORAL_TABLET | Freq: Every day | ORAL | 1 refills | Status: DC
Start: 2023-05-12 — End: 2023-05-22

## 2023-05-12 NOTE — Telephone Encounter (Signed)
Oral Oncology Pharmacist Encounter  Received new prescription for Xeloda (capecitabine) for the treatment of metastatic colon cancer in conjunction with oxaliplatin and bevacizumab, planned duration until disease progression or unacceptable drug toxicity. Start date ~2 weeks per MD note.   CBC w/ Diff and CMP from 04/22/23 assessed, no relevant lab abnormalities requiring baseline dose adjustment required at this time. Prescription dose and frequency assessed for appropriateness.  Current medication list in Epic reviewed, DDIs with Xeloda identified: Category C DDI between Xeloda and Ondansetron and Lexapro due to risk of Qtc prolongation with fluorouracil products. Noted patient only taking ondansetron PRN and PO route, risk higher with IV administration. No change in therapy warranted for ondansetron or Lexapro at this time.   Evaluated chart and no patient barriers to medication adherence noted.   Patient agreement for treatment documented in MD note on 05/12/23.  Prescription has been e-scribed to the [facility] for benefits analysis and approval.  Oral Oncology Clinic will continue to follow for insurance authorization, copayment issues, initial counseling and start date.  Lenord Carbo, PharmD, BCPS, [facility] Hematology/Oncology Clinical Pharmacist [facility] Oral Chemotherapy Navigation Clinics [phone] 05/12/2023 12:38 PM

## 2023-05-12 NOTE — Assessment & Plan Note (Signed)
Stage IIIC (pT4a, pN2b, cM0), MSS, KRAS/NRAS/BRAF wild type -diagnosed in 07/2021 -s/p partial colectomy by Dr. Derrell Lolling 07/23/21, final path showed: pT4a lesion with LVI and PNI, no perforation; clear margins; 9/19 positive LNs, pN2b --He received Capox 09/11/21 - 01/15/22, oxali discontinued due to significant nausea/vomiting and headaches. He completed Xeloda in late June 2023.  -He unfortunately developed oligo liver metastasis based on PET/CT in 08/2022, liver biopsy was attempted but did not feasible.  -Guardian review was positive for circulating tumor DNA, consistent with recurrence. -Patient requested second opinion of surgery, was seen at [facility].  Neoadjuvant chemotherapy recommended before surgical resection, and he agreed. -He started chemotherapy FOLFIRI on 10/20/2022, no bevacizumab due to pending surgery  -NGS Tempus showed no targetable therapy, KRAS/NRAS/BRAF wild type, would not recommend EGFR inhibitor for first line due to his primary right side colon cancer. -he completed 3 months chemo in April 2024.  He was referred to liver surgeon Dr. Gwenlyn Perking at [facility], for patient's personal issue, his surgery has been postponed until recently.  Unfortunately his recent CT scan on 04/16/23 at [facility] showed possible bone metastasis at right Acetabulum, his liver resection was canceled.   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  He is at high risk for fracture. -I recommend restarting chemotherapy.  He unfortunately just lost his wife, plan to restart in 2 weeks.

## 2023-05-12 NOTE — Telephone Encounter (Signed)
Oral Oncology Patient Advocate Encounter  After completing a benefits investigation, prior authorization for capecitabine is not required at this time through [facility].  Patient's copay is $4.     Jinger Neighbors, CPhT-Adv Oncology Pharmacy Patient Advocate [facility] Direct Number: [phone]  Fax: [phone]

## 2023-05-12 NOTE — Progress Notes (Signed)
[facility]   [phone] [phone]   Clinic Follow up Note   Patient Care Team: Frederic Jericho, [name] as PCP - General (Physician Assistant) Axel Filler, MD as Consulting Physician (General Surgery) Pollyann Samples, NP as Nurse Practitioner (Nurse Practitioner) Malachy Mood, MD as Consulting Physician (Hematology) Ewing Schlein, MD as Referring Physician (Pain Medicine)  Date of Service:  05/12/2023  I connected with Joseph Haney on 05/12/2023 at  8:40 AM EDT by telephone visit and verified that I am speaking with the correct person using two identifiers.  I discussed the limitations, risks, security and privacy concerns of performing an evaluation and management service by telephone and the availability of in person appointments. I also discussed with the patient that there may be a patient responsible charge related to this service. The patient expressed understanding and agreed to proceed.   Other persons participating in the visit and their role in the encounter:  No  Patient's location:  Home Provider's location:  [facility] Office  CHIEF COMPLAINT: f/u of recurrent colon cancer   CURRENT THERAPY:   CapeOX + Bevacizumab q 21d  starting 05/25/2023  ASSESSMENT & PLAN:  Joseph Haney is a 47 y.o. 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 Derrell Lolling 07/23/21, final path showed: pT4a lesion with LVI and PNI, no perforation; clear margins; 9/19 positive LNs, pN2b --He received Capox 09/11/21 - 01/15/22, oxali discontinued due to significant nausea/vomiting and headaches. He completed Xeloda in late June 2023.  -He unfortunately developed oligo liver metastasis based on PET/CT in 08/2022, liver biopsy was attempted but did not feasible.  -Guardian review was positive for circulating tumor DNA, consistent with recurrence. -Patient  requested second opinion of surgery, was seen at [facility].  Neoadjuvant chemotherapy recommended before surgical resection, and he agreed. -He started chemotherapy FOLFIRI on 10/20/2022, no bevacizumab due to pending surgery  -NGS Tempus showed no targetable therapy, KRAS/NRAS/BRAF wild type, would not recommend EGFR inhibitor for first line due to his primary right side colon cancer. -he completed 3 months chemo in April 2024.  He was referred to liver surgeon Dr. Gwenlyn Perking at [facility], for patient's personal issue, his surgery has been postponed until recently.  Unfortunately his recent CT scan on 04/16/23 at [facility] showed possible bone metastasis at right Acetabulum, his liver resection was canceled.   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  He is at high risk for fracture. -I recommend restarting chemotherapy.  He unfortunately just lost his wife, plan to restart in 2 weeks. -I discussed option of CapeOx, FOLFOX and FOLFIRI, along with bevacizumab.  He would like to have CapeOx due to the less frequent office visit.  He previously took a CapeOx as adjuvant therapy, I reviewed the potential side effect with him again, and also side effect from bevacizumab, especially hypertension, proteinuria, small risk of thrombosis and hemorrhage, he agrees to proceed.  plan to start in 2 weeks.  Due to the previous significant nausea, will add IV Emend, and additional premeds to prevent reaction. -Will use EGFR inhibitor panitumumab at the next line treatment when he progressed further.  PLAN: - Reviewed CT scan w/ pt -recommend Whole body Bone scan - recommend restarting chemotherapy - recommend CapeOx +Beva -I referred SW Ronda Fairly -lab/flush  and treatment in 2 weeks -will discuss zometa on next visit    SUMMARY OF ONCOLOGIC HISTORY: Oncology History Overview Note   Cancer  Staging  Cancer of right colon [facility]) Staging form: Colon and Rectum, AJCC 8th Edition - Pathologic stage from 07/23/2021: Stage IIIC (pT4a, pN2b, cM0) - Signed by Pollyann Samples, NP on 08/15/2021 Stage prefix: Initial diagnosis Histologic grading system: 4 grade system Histologic grade (G): G2 Laterality: Right Lymph-vascular invasion (LVI): LVI present/identified, NOS Perineural invasion (PNI): Present Microsatellite instability (MSI): Stable     Cancer of right colon (HCC)  07/19/2021 Imaging   CT AP IMPRESSION: Focal area of high-grade narrowing of the proximal ascending colon may be sequela of chronic inflammation and stricture versus a mass. There is mild distension of the cecum and terminal ileum. Clinical correlation and further evaluation with lower GI study or colonoscopy recommended.   07/22/2021 Procedure   Colonoscopy A severe stenosis was found in the distal ascending colon and was non-traversed. It was ulcerated and inflamed. This could be inflammatory versus malignant stricture.   07/22/2021 Initial Biopsy   FINAL MICROSCOPIC DIAGNOSIS:  A. COLON, ASCENDING, BIOPSY:  - Invasive colorectal adenocarcinoma, moderately differentiated.    07/23/2021 Definitive Surgery   PRE-OPERATIVE DIAGNOSIS:  right colon mass POST-OPERATIVE DIAGNOSIS:  right colon mass PROCEDURE:  Procedure(s): XI ROBOT ASSISTED LAPAROSCOPIC RIGHT COLECTOMY (N/A) Dr. Derrell Lolling   07/23/2021 Pathology Results   FINAL MICROSCOPIC DIAGNOSIS:  A. COLON, RIGHT, RESECTION:  - Invasive moderately differentiated colonic adenocarcinoma, 4.5 cm.  - Carcinoma extends into pericolonic connective tissue and focally  involves serosal surface.  - Margins not involved.  - Metastatic carcinoma in nine of nineteen lymph nodes (9/19).  - Unremarkable appendix. MMR normal, MSI-stable    07/23/2021 Cancer Staging   Staging form: Colon and Rectum, AJCC 8th Edition - Pathologic stage from 07/23/2021: Stage IIIC  (pT4a, pN2b, cM0) - Signed by Pollyann Samples, NP on 08/15/2021 Stage prefix: Initial diagnosis Histologic grading system: 4 grade system Histologic grade (G): G2 Laterality: Right Lymph-vascular invasion (LVI): LVI present/identified, NOS Perineural invasion (PNI): Present Microsatellite instability (MSI): Stable   07/24/2021 Initial Diagnosis   Colon adenocarcinoma (HCC)   07/25/2021 Tumor Marker   CEA: 2.9   07/28/2021 Imaging   CT chest IMPRESSION: 1. No evidence of metastatic disease within the chest. 2. Lungs demonstrate atelectasis, most evident lower lobes and bases of the left upper lobe lingula and right middle lobe. No convincing pneumonia and no evidence of pulmonary edema.     09/11/2021 - 01/15/2022 Chemotherapy   Patient is on Treatment Plan : COLORECTAL Xelox (Capeox) q21d     08/2022 Progression   PET scan showed hypermetabolic oligo liver metastasis, biopsy attempted but liver lesion was not seen by [place]. GuardantReveal was also positive for circulating tumor DNA    09/02/2022 - 09/02/2022 Chemotherapy   Patient is on Treatment Plan : COLORECTAL FOLFIRI + Bevacizumab q14d      Genetic Testing   Negative genetic testing. No pathogenic variants identified on the [facility] Multi-Cancer+RNA panel. VUS in APC called [id number] identified. The report date is 09/15/2022.  The Multi-Cancer + RNA Panel offered by [facility] includes sequencing and/or deletion/duplication analysis of the following 70 genes:  AIP*, ALK, APC*, ATM*, AXIN2*, BAP1*, BARD1*, BLM*, BMPR1A*, BRCA1*, BRCA2*, BRIP1*, CDC73*, CDH1*, CDK4, CDKN1B*, CDKN2A, CHEK2*, CTNNA1*, DICER1*, EPCAM, EGFR, FH*, FLCN*, GREM1, HOXB13, KIT, LZTR1, MAX*, MBD4, MEN1*, MET, MITF, MLH1*, MSH2*, MSH3*, MSH6*, MUTYH*, NF1*, NF2*, NTHL1*, PALB2*, PDGFRA, PMS2*, POLD1*, POLE*,  POT1*, PRKAR1A*, PTCH1*, PTEN*, RAD51C*, RAD51D*, RB1*, RET, SDHA*, SDHAF2*, SDHB*, SDHC*, SDHD*, SMAD4*, SMARCA4*, SMARCB1*, SMARCE1*, STK11*, SUFU*,  TMEM127*, TP53*, TSC1*, TSC2*, VHL*. RNA analysis is performed for * genes.   10/20/2022 - 12/31/2022 Chemotherapy   Patient is on Treatment Plan : COLORECTAL FOLFIRI q14d     05/26/2023 -  Chemotherapy   Patient is on Treatment Plan : COLORECTAL CapeOx + Bevacizumab q21d        INTERVAL HISTORY:  Joseph Haney was contacted for a follow up of recurrent colon cancer . He was last seen by me on 04/22/2023.  Pt report of wife passing a week ago. Pt stated that he has no numbness and tingling in his fingertips and toes.   All other systems were reviewed with the patient and are negative.  MEDICAL HISTORY:  Past Medical History:  Diagnosis Date   Alcohol abuse    Cancer (HCC)    GERD (gastroesophageal reflux disease)    Seizure (HCC)     SURGICAL HISTORY: Past Surgical History:  Procedure Laterality Date   BIOPSY  07/22/2021   Procedure: BIOPSY;  Surgeon: Kathi Der, MD;  Location: [facility];  Service: Gastroenterology;;   COLON SURGERY     COLONOSCOPY WITH PROPOFOL N/A 07/22/2021   Procedure: COLONOSCOPY WITH PROPOFOL;  Surgeon: Kathi Der, MD;  Location: [facility];  Service: Gastroenterology;  Laterality: N/A;   IR IMAGING GUIDED PORT INSERTION  10/17/2022   NO PAST SURGERIES     SUBMUCOSAL TATTOO INJECTION  07/22/2021   Procedure: SUBMUCOSAL TATTOO INJECTION;  Surgeon: Kathi Der, MD;  Location: [facility];  Service: Gastroenterology;;    I have reviewed the social history and family history with the patient and they are unchanged from previous note.  ALLERGIES:  is allergic to latex and morphine and codeine.  MEDICATIONS:  Current Outpatient Medications  Medication Sig Dispense Refill   carvedilol (COREG) 25 MG tablet Take 1 tablet (25 mg total) by mouth 2 (two) times daily with a meal. 30 tablet 0   cyclobenzaprine (FLEXERIL) 10 MG tablet Take 10-20 mg by mouth 2 (two) times daily as needed for muscle spasms.     dexamethasone (DECADRON) 4 MG  tablet Take 1 tablet (4 mg total) by mouth daily. Take for 3-5 days after chemo for nausea and fatigue 20 tablet 1   dexamethasone (DECADRON) 4 MG tablet Take 2 tablets (8 mg total) by mouth daily. Start the day after chemotherapy for 2 days. Take with food. 30 tablet 1   diphenoxylate-atropine (LOMOTIL) 2.5-0.025 MG tablet Take 1 tablet by mouth 4 (four) times daily as needed for diarrhea or loose stools. (Patient not taking: Reported on 01/07/2023) 30 tablet 0   escitalopram (LEXAPRO) 10 MG tablet Take 10 mg by mouth daily.     ferrous sulfate 325 (65 FE) MG tablet Take 1 tablet (325 mg total) by mouth 2 (two) times daily with a meal. (Patient not taking: Reported on 01/07/2023) 100 tablet 3   gabapentin (NEURONTIN) 300 MG capsule Take 300 mg by mouth 3 (three) times daily.     HYDROcodone-acetaminophen (NORCO/VICODIN) 5-325 MG tablet Take 1 tablet by mouth every 6 (six) hours as needed for moderate pain.     lidocaine-prilocaine (EMLA) cream Apply to affected area once 30 g 3   magic mouthwash (nystatin, lidocaine, diphenhydrAMINE, alum & mag hydroxide) suspension Take 5 mLs by mouth 3 (three) times daily as needed for mouth pain. (Patient not taking: Reported on 01/07/2023) 140 mL 1  ondansetron (ZOFRAN) 8 MG tablet Take 1 tablet (8 mg total) by mouth every 8 (eight) hours as needed for nausea or vomiting. Start on the third day after chemotherapy. 30 tablet 1   oxyCODONE (ROXICODONE) 5 MG immediate release tablet Take 1 tablet (5 mg total) by mouth every 6 (six) hours as needed for severe pain. (Patient not taking: Reported on 01/07/2023) 20 tablet 0   Oxycodone HCl 10 MG TABS Take 10 mg by mouth every 6 (six) hours as needed.     polyethylene glycol (MIRALAX / GLYCOLAX) 17 g packet Take 17 g by mouth daily as needed for mild constipation or moderate constipation. (Patient not taking: Reported on 01/07/2023) 14 each 0   potassium chloride SA (KLOR-CON M) 20 MEQ tablet Take 1 tablet (20 mEq total) by mouth  daily for 5 days. 30 tablet 0   prochlorperazine (COMPAZINE) 10 MG tablet Take 1 tablet (10 mg total) by mouth every 6 (six) hours as needed for nausea or vomiting. 30 tablet 1   promethazine (PHENERGAN) 25 MG tablet Take 1 tablet (25 mg total) by mouth every 6 (six) hours as needed for nausea or vomiting. 30 tablet 1   No current facility-administered medications for this visit.    PHYSICAL EXAMINATION: ECOG PERFORMANCE STATUS: 0 - Asymptomatic  There were no vitals filed for this visit. Wt Readings from Last 3 Encounters:  04/22/23 182 lb 8 oz (82.8 kg)  01/10/23 182 lb 5.1 oz (82.7 kg)  12/29/22 190 lb 3.2 oz (86.3 kg)     No vitals taken today, Exam not performed today  LABORATORY DATA:  I have reviewed the data as listed    Latest Ref Rng & Units 04/22/2023    1:57 PM 01/10/2023    5:41 AM 01/09/2023    8:07 AM  CBC  WBC 4.0 - 10.5 K/uL 5.6  7.9  7.3   Hemoglobin 13.0 - 17.0 g/dL 62.1  9.1  9.0   Hematocrit 39.0 - 52.0 % 41.4  27.7  27.6   Platelets 150 - 400 K/uL 286  272  250         Latest Ref Rng & Units 04/22/2023    1:57 PM 01/10/2023    5:41 AM 01/09/2023    7:05 AM  CMP  Glucose 70 - 99 mg/dL 97  308  657   BUN 6 - 20 mg/dL 11  6  6    Creatinine 0.61 - 1.24 mg/dL 8.46  9.62  9.52   Sodium 135 - 145 mmol/L 138  138  138   Potassium 3.5 - 5.1 mmol/L 3.9  3.5  3.2   Chloride 98 - 111 mmol/L 101  106  104   CO2 22 - 32 mmol/L 28  25  26    Calcium 8.9 - 10.3 mg/dL 9.7  8.4  8.3   Total Protein 6.5 - 8.1 g/dL 8.8     Total Bilirubin 0.3 - 1.2 mg/dL 0.4     Alkaline Phos 38 - 126 U/L 174     AST 15 - 41 U/L 19     ALT 0 - 44 U/L 10         RADIOGRAPHIC STUDIES: I have personally reviewed the radiological images as listed and agreed with the findings in the report. No results found.    Orders Placed This Encounter  Procedures   Consent Attestation for Oncology Treatment    Order Specific Question:   The patient is informed of risks, benefits,  side-effects of the prescribed oncology treatment. Potential short term and long term side effects and response rates discussed. After a long discussion, the patient made informed decision to proceed.    Answer:   Yes   CBC with Differential (Cancer Center Only)    Standing Status:   Future    Standing Expiration Date:   05/25/2024   CMP (Cancer Center only)    Standing Status:   Future    Standing Expiration Date:   05/25/2024   Total Protein, Urine dipstick    Standing Status:   Future    Standing Expiration Date:   05/25/2024   CBC with Differential (Cancer Center Only)    Standing Status:   Future    Standing Expiration Date:   06/15/2024   CMP (Cancer Center only)    Standing Status:   Future    Standing Expiration Date:   06/15/2024   PHYSICIAN COMMUNICATION ORDER    UA Protein, dipstick required prior to every 3rd cycle bevacizumab   ONCBCN PHYSICIAN COMMUNICATION 1    8 Number of doses of oxaliplatin received at [facility] or outside facility. signature of Provider. If patient has received greater than 5 doses of oxaliplatin, the following pre-medications should be ordered: dexamethasone, diphenhydramine, and formulary histamine H2 antagonist. If patient cannot tolerate oral histamine H2 antagonist, IV may be given.   All questions were answered. The patient knows to call the clinic with any problems, questions or concerns. No barriers to learning was detected. The total time spent in the appointment was 22 minutes.     Malachy Mood, MD 05/12/2023   Carolin Coy am acting as scribe for Malachy Mood, MD.   I have reviewed the above documentation for accuracy and completeness, and I agree with the above.

## 2023-05-12 NOTE — Progress Notes (Signed)
[facility] CSW Progress Note  Clinical Child psychotherapist  contacted pt to check in.  Pt recently lost his wife unexpectedly and was also informed of progression of his colon cancer.  Pt resides with his daughter, son and 49 year old grandchild.  Pt's wife's family resides in [place] and has been back and forth since she was hospitalized a few weeks ago.  Pt states he is disappointed to have to restart chemotherapy, but wishes to "stick around" as long as possible for his children and grandchildren and is taking it one day at a time.  CSW provided pt w/ contact details for counseling should pt feel at any point this would be beneficial.  CSW to meet w/ pt in person to check in at next oncology appointment.        Rachel Moulds, LCSW Clinical Social Worker [facility]    Patient is participating in a Managed Medicaid Plan:  Yes

## 2023-05-13 ENCOUNTER — Other Ambulatory Visit (HOSPITAL_COMMUNITY): Payer: Self-pay

## 2023-05-13 ENCOUNTER — Other Ambulatory Visit: Payer: Self-pay

## 2023-05-13 MED ORDER — CAPECITABINE 500 MG PO TABS
850.0000 mg/m2 | ORAL_TABLET | Freq: Two times a day (BID) | ORAL | 1 refills | Status: DC
  Filled 2023-05-13: qty 84, 21d supply, fill #0
  Filled 2023-05-13: qty 84, 14d supply, fill #0

## 2023-05-13 NOTE — Telephone Encounter (Signed)
Oral Chemotherapy Pharmacist Encounter  I spoke with patient for overview of: Xeloda (capecitabine) for the treatment of metastatic colon cancer in conjunction with oxaliplatin and bevacizumab, planned duration until disease progression or unacceptable drug toxicity. Start date ~2 weeks per MD note.   Counseled patient on administration, dosing, side effects, monitoring, drug-food interactions, safe handling, storage, and disposal.  Patient will take Xeloda 500mg  tablets, 3 tablets (1500mg ) by mouth in AM and 3 tabs (1500mg ) by mouth in PM, within 30 minutes of finishing meals, on days 1-14 of each 21 day cycle.   Xeloda and oxaliplatin start date: pending - patient knows not to start Xeloda until first day if oxaliplatin infusion  Adverse effects include but are not limited to: fatigue, decreased blood counts, GI upset, diarrhea, mouth sores, and hand-foot syndrome. Hand-foot syndrome: discussed use of cream such as Udderly Smooth Extra Care 20 or equivalent advanced care cream that has 20% urea content for advanced skin hydration while on Xeloda. Patient reports he has not had issues with HFS in the past with on Xeloda. Diarrhea: Patient will obtain Imodium (loperamide) to have on hand if they experience diarrhea. Patient knows to alert the office of 4 or more loose stools above baseline.  Patient offered referral to diclofenac prophylaxis hand-foot syndrome research project. Patient agreed to referral.   Reviewed with patient importance of keeping a medication schedule and plan for any missed doses. No barriers to medication adherence identified.  Medication reconciliation performed and medication/allergy list updated.  All questions answered.  Joseph Haney voiced understanding and appreciation.   Medication education handout placed in mail for patient. Patient knows to call the office with questions or concerns. Oral Chemotherapy Clinic phone number provided to patient.   Joseph Haney, PharmD, BCPS, [facility] Hematology/Oncology Clinical Pharmacist [facility] Oral Chemotherapy Navigation Clinics [phone] 05/13/2023 11:44 AM

## 2023-05-13 NOTE — Telephone Encounter (Signed)
Patient successfully OnBoarded and drug education provided by pharmacist. Medication scheduled to be shipped on Thursday, 05/14/23 for delivery on Friday, 05/15/23 from [facility] to patient's address. Patient also knows to call Alan Ripper at [phone] with any questions or concerns regarding receiving medication or if there is any unexpected change in co-pay.    Ardeen Fillers, CPhT Oncology Pharmacy Patient Advocate  [facility]  [phone] (phone) [phone] (fax) 05/13/2023 11:46 AM

## 2023-05-15 ENCOUNTER — Other Ambulatory Visit: Payer: Self-pay

## 2023-05-15 DIAGNOSIS — Z1159 Encounter for screening for other viral diseases: Secondary | ICD-10-CM | POA: Diagnosis not present

## 2023-05-15 DIAGNOSIS — Z0000 Encounter for general adult medical examination without abnormal findings: Secondary | ICD-10-CM | POA: Diagnosis not present

## 2023-05-15 DIAGNOSIS — Z125 Encounter for screening for malignant neoplasm of prostate: Secondary | ICD-10-CM | POA: Diagnosis not present

## 2023-05-15 DIAGNOSIS — Z79899 Other long term (current) drug therapy: Secondary | ICD-10-CM | POA: Diagnosis not present

## 2023-05-15 DIAGNOSIS — Z6826 Body mass index (BMI) 26.0-26.9, adult: Secondary | ICD-10-CM | POA: Diagnosis not present

## 2023-05-21 ENCOUNTER — Other Ambulatory Visit (HOSPITAL_COMMUNITY): Payer: Self-pay

## 2023-05-21 ENCOUNTER — Telehealth: Payer: Self-pay

## 2023-05-21 NOTE — Telephone Encounter (Signed)
Attempted to contact patient regarding enrollment for topical diclofenac study. Unable to leave voicemail.   Jerry Caras, PharmD PGY2 Oncology Pharmacy Resident  [phone]  05/21/2023 1:19 PM

## 2023-05-22 ENCOUNTER — Other Ambulatory Visit: Payer: Self-pay

## 2023-05-22 DIAGNOSIS — C182 Malignant neoplasm of ascending colon: Secondary | ICD-10-CM

## 2023-05-22 MED ORDER — DEXAMETHASONE 4 MG PO TABS: 8.0000 mg | ORAL_TABLET | Freq: Every day | ORAL | 1 refills | Status: DC

## 2023-05-22 MED ORDER — LIDOCAINE-PRILOCAINE 2.5-2.5 % EX CREA
TOPICAL_CREAM | CUTANEOUS | 3 refills | Status: DC
Start: 2023-05-22 — End: 2023-07-24

## 2023-05-22 MED FILL — Dexamethasone Sodium Phosphate Inj 100 MG/10ML: INTRAMUSCULAR | Qty: 1 | Status: AC

## 2023-05-22 NOTE — Telephone Encounter (Signed)
Left voicemail regarding enrollment for topical diclofenac study.   Jerry Caras, PharmD PGY2 Oncology Pharmacy Resident  [phone]  05/22/2023 9:11 AM

## 2023-05-23 ENCOUNTER — Other Ambulatory Visit: Payer: Self-pay

## 2023-05-24 NOTE — Assessment & Plan Note (Signed)
Stage IIIC (pT4a, pN2b, cM0), MSS, KRAS/NRAS/BRAF wild type -diagnosed in 07/2021 -s/p partial colectomy by Dr. Derrell Lolling 07/23/21, final path showed: pT4a lesion with LVI and PNI, no perforation; clear margins; 9/19 positive LNs, pN2b --He received Capox 09/11/21 - 01/15/22, oxali discontinued due to significant nausea/vomiting and headaches. He completed Xeloda in late June 2023.  -He unfortunately developed oligo liver metastasis based on PET/CT in 08/2022, liver biopsy was attempted but did not feasible.  -Guardian review was positive for circulating tumor DNA, consistent with recurrence. -Patient requested second opinion of surgery, was seen at [facility].  Neoadjuvant chemotherapy recommended before surgical resection, and he agreed. -He started chemotherapy FOLFIRI on 10/20/2022, no bevacizumab due to pending surgery  -NGS Tempus showed no targetable therapy, KRAS/NRAS/BRAF wild type, would not recommend EGFR inhibitor for first line due to his primary right side colon cancer. -he completed 3 months chemo in April 2024.  He was referred to liver surgeon Dr. Gwenlyn Perking at [facility], for patient's personal issue, his surgery has been postponed until recently.  Unfortunately his recent CT scan on 04/16/23 at [facility] showed possible bone metastasis at right Acetabulum, his liver resection was canceled.   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  He is at high risk for fracture. -I recommend restarting chemotherapy.  He unfortunately recently lost his wife, plan to restart today on 05/25/23.

## 2023-05-25 ENCOUNTER — Inpatient Hospital Stay: Payer: Medicaid Other

## 2023-05-25 ENCOUNTER — Other Ambulatory Visit: Payer: Self-pay

## 2023-05-25 ENCOUNTER — Inpatient Hospital Stay (HOSPITAL_BASED_OUTPATIENT_CLINIC_OR_DEPARTMENT_OTHER): Payer: Medicaid Other | Admitting: Hematology

## 2023-05-25 ENCOUNTER — Encounter: Payer: Self-pay | Admitting: Hematology

## 2023-05-25 VITALS — BP 129/80 | HR 74

## 2023-05-25 VITALS — BP 106/66 | HR 75 | Temp 98.9°F | Resp 15 | Ht 69.0 in | Wt 172.0 lb

## 2023-05-25 DIAGNOSIS — C7951 Secondary malignant neoplasm of bone: Secondary | ICD-10-CM | POA: Diagnosis not present

## 2023-05-25 DIAGNOSIS — K219 Gastro-esophageal reflux disease without esophagitis: Secondary | ICD-10-CM | POA: Diagnosis not present

## 2023-05-25 DIAGNOSIS — C182 Malignant neoplasm of ascending colon: Secondary | ICD-10-CM

## 2023-05-25 DIAGNOSIS — D500 Iron deficiency anemia secondary to blood loss (chronic): Secondary | ICD-10-CM

## 2023-05-25 DIAGNOSIS — G40909 Epilepsy, unspecified, not intractable, without status epilepticus: Secondary | ICD-10-CM | POA: Diagnosis not present

## 2023-05-25 DIAGNOSIS — Z7952 Long term (current) use of systemic steroids: Secondary | ICD-10-CM | POA: Diagnosis not present

## 2023-05-25 DIAGNOSIS — Z95828 Presence of other vascular implants and grafts: Secondary | ICD-10-CM

## 2023-05-25 DIAGNOSIS — Z5111 Encounter for antineoplastic chemotherapy: Secondary | ICD-10-CM | POA: Diagnosis present

## 2023-05-25 DIAGNOSIS — F1011 Alcohol abuse, in remission: Secondary | ICD-10-CM | POA: Diagnosis not present

## 2023-05-25 DIAGNOSIS — R112 Nausea with vomiting, unspecified: Secondary | ICD-10-CM | POA: Diagnosis not present

## 2023-05-25 DIAGNOSIS — C787 Secondary malignant neoplasm of liver and intrahepatic bile duct: Secondary | ICD-10-CM | POA: Diagnosis not present

## 2023-05-25 DIAGNOSIS — Z79899 Other long term (current) drug therapy: Secondary | ICD-10-CM | POA: Diagnosis not present

## 2023-05-25 LAB — CBC WITH DIFFERENTIAL (CANCER CENTER ONLY)
Abs Immature Granulocytes: 0.03 10*3/uL (ref 0.00–0.07)
Basophils Absolute: 0 10*3/uL (ref 0.0–0.1)
Basophils Relative: 1 %
Eosinophils Absolute: 0.2 10*3/uL (ref 0.0–0.5)
Eosinophils Relative: 3 %
HCT: 36.1 % — ABNORMAL LOW (ref 39.0–52.0)
Hemoglobin: 11.6 g/dL — ABNORMAL LOW (ref 13.0–17.0)
Immature Granulocytes: 1 %
Lymphocytes Relative: 34 %
Lymphs Abs: 1.9 10*3/uL (ref 0.7–4.0)
MCH: 27.2 pg (ref 26.0–34.0)
MCHC: 32.1 g/dL (ref 30.0–36.0)
MCV: 84.5 fL (ref 80.0–100.0)
Monocytes Absolute: 0.4 10*3/uL (ref 0.1–1.0)
Monocytes Relative: 6 %
Neutro Abs: 3.1 10*3/uL (ref 1.7–7.7)
Neutrophils Relative %: 55 %
Platelet Count: 321 10*3/uL (ref 150–400)
RBC: 4.27 MIL/uL (ref 4.22–5.81)
RDW: 13.1 % (ref 11.5–15.5)
WBC Count: 5.6 10*3/uL (ref 4.0–10.5)
nRBC: 0 % (ref 0.0–0.2)

## 2023-05-25 LAB — CMP (CANCER CENTER ONLY)
ALT: 7 U/L (ref 0–44)
AST: 18 U/L (ref 15–41)
Albumin: 4.3 g/dL (ref 3.5–5.0)
Alkaline Phosphatase: 204 U/L — ABNORMAL HIGH (ref 38–126)
Anion gap: 6 (ref 5–15)
BUN: 11 mg/dL (ref 6–20)
CO2: 29 mmol/L (ref 22–32)
Calcium: 9.5 mg/dL (ref 8.9–10.3)
Chloride: 103 mmol/L (ref 98–111)
Creatinine: 0.9 mg/dL (ref 0.61–1.24)
GFR, Estimated: >60 mL/min (ref >60)
Glucose, Bld: 119 mg/dL — ABNORMAL HIGH (ref 70–99)
Potassium: 3.8 mmol/L (ref 3.5–5.1)
Sodium: 138 mmol/L (ref 135–145)
Total Bilirubin: 0.4 mg/dL (ref 0.3–1.2)
Total Protein: 8.2 g/dL — ABNORMAL HIGH (ref 6.5–8.1)

## 2023-05-25 LAB — TOTAL PROTEIN, URINE DIPSTICK: Protein, ur: NEGATIVE mg/dL

## 2023-05-25 MED ORDER — HEPARIN SOD (PORK) LOCK FLUSH 100 UNIT/ML IV SOLN
500.0000 [IU] | Freq: Once | INTRAVENOUS | Status: AC | PRN
  Administered 2023-05-25: 500 [IU]

## 2023-05-25 MED ORDER — DEXTROSE 5 % IV SOLN: Freq: Once | INTRAVENOUS | Status: AC

## 2023-05-25 MED ORDER — SODIUM CHLORIDE 0.9% FLUSH
10.0000 mL | INTRAVENOUS | Status: DC | PRN
  Administered 2023-05-25: 10 mL

## 2023-05-25 MED ORDER — SODIUM CHLORIDE 0.9 % IV SOLN
10.0000 mg | Freq: Once | INTRAVENOUS | Status: AC
  Administered 2023-05-25: 10 mg via INTRAVENOUS
  Filled 2023-05-25: qty 10

## 2023-05-25 MED ORDER — SODIUM CHLORIDE 0.9 % IV SOLN
7.5000 mg/kg | Freq: Once | INTRAVENOUS | Status: AC
  Administered 2023-05-25: 600 mg via INTRAVENOUS
  Filled 2023-05-25: qty 8

## 2023-05-25 MED ORDER — OXALIPLATIN CHEMO INJECTION 100 MG/20ML
175.0000 mg | Freq: Once | INTRAVENOUS | Status: AC
  Administered 2023-05-25: 175 mg via INTRAVENOUS
  Filled 2023-05-25: qty 24.5

## 2023-05-25 MED ORDER — SODIUM CHLORIDE 0.9 % IV SOLN: Freq: Once | INTRAVENOUS | Status: AC

## 2023-05-25 MED ORDER — SODIUM CHLORIDE 0.9% FLUSH
10.0000 mL | Freq: Once | INTRAVENOUS | Status: AC
  Administered 2023-05-25: 10 mL

## 2023-05-25 MED ORDER — DIPHENHYDRAMINE HCL 50 MG/ML IJ SOLN
25.0000 mg | Freq: Once | INTRAMUSCULAR | Status: AC
  Administered 2023-05-25: 25 mg via INTRAVENOUS
  Filled 2023-05-25: qty 1

## 2023-05-25 MED ORDER — PALONOSETRON HCL INJECTION 0.25 MG/5ML
0.2500 mg | Freq: Once | INTRAVENOUS | Status: AC
  Administered 2023-05-25: 0.25 mg via INTRAVENOUS
  Filled 2023-05-25: qty 5

## 2023-05-25 MED ORDER — SODIUM CHLORIDE 0.9 % IV SOLN
150.0000 mg | Freq: Once | INTRAVENOUS | Status: AC
  Administered 2023-05-25: 150 mg via INTRAVENOUS
  Filled 2023-05-25: qty 150

## 2023-05-25 MED ORDER — FAMOTIDINE IN NACL 20-0.9 MG/50ML-% IV SOLN
20.0000 mg | Freq: Once | INTRAVENOUS | Status: AC
  Administered 2023-05-25: 20 mg via INTRAVENOUS
  Filled 2023-05-25: qty 50

## 2023-05-25 NOTE — Progress Notes (Signed)
Nutrition Follow-up:  Patient with stage III cancer of right colon, with mets to liver.  Surgery at [facility] cancelled due to further progression in bones.  Patient restarting capeOx and bevacizumab.  Recently lost wife, unexpectedly.    Met with patient during infusion.  Reports that appetite has been down for the past few months.  Eating trail mix during visit.  Reports that he usually does not eat until after noon.  Gets up around 6-7 am. Yesterday able to eat about half of 6 inch philly chicken sub sandwich.    Likes boost shakes and has drank some in the past.     Medications: reviewed  Labs: reviewed  Anthropometrics:   Weight 172 lb today 190 lb 3.2 oz on 4/29 186 lb 12.8 oz on 4/2  7% weight loss in the last 5 months   NUTRITION DIAGNOSIS: Inadequate oral intake related to cancer , loss of loved one as evidenced by 7% weight loss in the last  5 months and decreased appetite.      INTERVENTION:  Encouraged ensure/boost shakes daily. Coupons given. Discussed foods high in protein and encouraged at each meal. Contact information provided    MONITORING, EVALUATION, GOAL: weight trends, intake   NEXT VISIT: Monday, Oct 14 during infusion  Lilia Letterman B. Freida Busman, RD, LDN Registered Dietitian [phone]

## 2023-05-25 NOTE — Progress Notes (Signed)
Ok to decrease Oxaliplatin dose based on most current weight.  Anola Gurney Forrest, [place], BCPS, BCOP 05/25/2023 10:37 AM

## 2023-05-25 NOTE — Progress Notes (Signed)
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 Derrell Lolling 07/23/21, final path showed: pT4a lesion with LVI and PNI, no perforation; clear margins; 9/19 positive LNs, pN2b --He received Capox 09/11/21 - 01/15/22, oxali discontinued due to significant nausea/vomiting and headaches. He completed Xeloda in late June 2023.  -He unfortunately developed oligo liver metastasis based on PET/CT in 08/2022, liver biopsy was attempted but did not feasible.  -Guardian review was positive for circulating tumor DNA, consistent with recurrence. -Patient requested second opinion of surgery, was seen at [facility].  Neoadjuvant chemotherapy recommended before surgical resection, and he agreed. -He started chemotherapy FOLFIRI on 10/20/2022, no bevacizumab due to pending surgery  -NGS Tempus showed no targetable therapy, KRAS/NRAS/BRAF wild type, would not recommend EGFR inhibitor for first line due to his primary right side colon cancer. -he completed 3 months chemo in April 2024.  He was referred to liver surgeon Dr. Gwenlyn Perking at [facility], for patient's personal issue, his surgery has been postponed until recently.  Unfortunately his recent CT scan on 04/16/23 at [facility] showed possible bone metastasis at right Acetabulum, his liver resection was canceled.   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  He is at high risk for fracture. -I recommend restarting chemotherapy.  He unfortunately recently lost his wife, plan to restart today on 05/25/23.     Assessment and Plan    Metastatic Colon Cancer Bone metastasis identified on imaging. No current pain or discomfort. Discussed restarting chemotherapy with a lower dose due to previous intolerance. -Start Oxaliplatin infusion today. -Start Capecitabine (oral) tonight, 3 tablets twice a day for two weeks, then off for a week. -Add Avastin to regimen, monitoring for potential side effects including high blood pressure, proteinuria, bleeding, blood clots, and bowel perforation. -Order PET scan in 2-3 weeks as a new baseline scan.  Bone metastasis and risk of fracture Right hip at risk due to bone metastasis. Discussed potential orthopedic consultation for prophylactic pin placement. -Consider orthopedic consultation for potential pin placement in right hip. -Start Zoledronic Acid (Azelate) infusions every three months after dental clearance to strengthen bone and prevent fracture. -Start over-the-counter Calcium and Vitamin D supplements to strengthen bone.  Weight Loss Lost 10 pounds over the past four months, likely due to stress and decreased appetite. Discussed importance of maintaining weight and adequate caloric intake during chemotherapy. -Encourage high protein, high calorie diet. -Refer to dietician for dietary guidance. -Consider Ensure or Boost for supplemental nutrition.  Nausea Management Previous issues with chemotherapy-induced nausea. -Continue home nausea medications as needed. -Start Dexamethasone tomorrow for next three days to suppress nausea and stimulate appetite.  Follow-up in three weeks for next chemotherapy cycle. Call if any issues arise in the meantime.      Plan -If labs adequate, will start first  cycle of oxaliplatin and capecitabine today, with dose reduction of oxaliplatin due to overall poor tolerance previously. -Restaging PET scan in the next 2 to 3 weeks -Dental clearance for Zometa -Follow-up in 3 weeks before next cycle chemo    SUMMARY OF ONCOLOGIC HISTORY: Oncology History Overview Note   Cancer Staging  Cancer of right colon [facility]) Staging form: Colon and Rectum, AJCC 8th Edition - Pathologic stage from 07/23/2021: Stage IIIC (pT4a, pN2b, cM0) - Signed by Pollyann Samples, NP on 08/15/2021 Stage prefix: Initial diagnosis Histologic grading system: 4 grade system Histologic grade (G): G2 Laterality: Right Lymph-vascular invasion (LVI): LVI present/identified, NOS Perineural invasion (PNI): Present Microsatellite instability (MSI): Stable     Cancer of right colon (HCC)  07/19/2021 Imaging   CT AP IMPRESSION: Focal area of high-grade narrowing of the proximal ascending colon may be sequela of chronic inflammation and stricture versus a mass. There is mild distension of the cecum and terminal ileum. Clinical correlation and further evaluation with lower GI study or colonoscopy recommended.   07/22/2021 Procedure   Colonoscopy A severe stenosis was found in the distal ascending colon and was non-traversed. It was ulcerated and inflamed. This could be inflammatory versus malignant stricture.   07/22/2021 Initial Biopsy   FINAL MICROSCOPIC DIAGNOSIS:  A. COLON, ASCENDING, BIOPSY:  - Invasive colorectal adenocarcinoma, moderately differentiated.    07/23/2021 Definitive Surgery   PRE-OPERATIVE DIAGNOSIS:  right colon mass POST-OPERATIVE DIAGNOSIS:  right colon mass PROCEDURE:  Procedure(s): XI ROBOT ASSISTED LAPAROSCOPIC RIGHT COLECTOMY (N/A) Dr. Derrell Lolling   07/23/2021 Pathology Results   FINAL MICROSCOPIC DIAGNOSIS:  A. COLON, RIGHT, RESECTION:  - Invasive moderately differentiated colonic adenocarcinoma, 4.5 cm.   - Carcinoma extends into pericolonic connective tissue and focally  involves serosal surface.  - Margins not involved.  - Metastatic carcinoma in nine of nineteen lymph nodes (9/19).  - Unremarkable appendix. MMR normal, MSI-stable    07/23/2021 Cancer Staging   Staging form: Colon and Rectum, AJCC 8th Edition - Pathologic stage from 07/23/2021: Stage IIIC (pT4a, pN2b, cM0) - Signed by Pollyann Samples, NP on 08/15/2021 Stage prefix: Initial diagnosis Histologic grading system: 4 grade system Histologic grade (G): G2 Laterality: Right Lymph-vascular invasion (LVI): LVI present/identified, NOS Perineural invasion (PNI): Present Microsatellite instability (MSI): Stable   07/24/2021 Initial Diagnosis   Colon adenocarcinoma (HCC)   07/25/2021 Tumor Marker   CEA: 2.9   07/28/2021 Imaging   CT chest IMPRESSION: 1. No evidence of metastatic disease within the chest. 2. Lungs demonstrate atelectasis, most evident lower lobes and bases of the left upper lobe lingula and right middle lobe. No convincing pneumonia and no evidence of pulmonary edema.     09/11/2021 - 01/15/2022 Chemotherapy   Patient is on Treatment Plan : COLORECTAL Xelox (Capeox) q21d     08/2022 Progression   PET scan showed hypermetabolic oligo liver metastasis, biopsy attempted but liver lesion was not seen by [place]. GuardantReveal was also positive for circulating tumor DNA    09/02/2022 - 09/02/2022 Chemotherapy   Patient is on Treatment Plan : COLORECTAL FOLFIRI + Bevacizumab q14d      Genetic Testing   Negative genetic testing. No pathogenic variants identified on the [facility] Multi-Cancer+RNA panel. VUS in APC called [id number] identified. The report date is 09/15/2022.  The Multi-Cancer + RNA Panel offered by [facility] includes sequencing and/or deletion/duplication analysis of the following 70 genes:  AIP*, ALK, APC*, ATM*, AXIN2*, BAP1*, BARD1*, BLM*, BMPR1A*, BRCA1*, BRCA2*, BRIP1*, CDC73*, CDH1*, CDK4, CDKN1B*,  CDKN2A, CHEK2*,  CTNNA1*, DICER1*, EPCAM, EGFR, FH*, FLCN*, GREM1, HOXB13, KIT, LZTR1, MAX*, MBD4, MEN1*, MET, MITF, MLH1*, MSH2*, MSH3*, MSH6*, MUTYH*, NF1*, NF2*, NTHL1*, PALB2*, PDGFRA, PMS2*, POLD1*, POLE*, POT1*, PRKAR1A*, PTCH1*, PTEN*, RAD51C*, RAD51D*, RB1*, RET, SDHA*, SDHAF2*, SDHB*, SDHC*, SDHD*, SMAD4*, SMARCA4*, SMARCB1*, SMARCE1*, STK11*, SUFU*, TMEM127*, TP53*, TSC1*, TSC2*, VHL*. RNA analysis is performed for * genes.   10/20/2022 - 12/31/2022 Chemotherapy   Patient is on Treatment Plan : COLORECTAL FOLFIRI q14d     05/25/2023 -  Chemotherapy   Patient is on Treatment Plan : COLORECTAL CapeOx + Bevacizumab q21d        Discussed the use of AI scribe software for clinical note transcription with the patient, who gave verbal consent to proceed.  History of Present Illness   The patient, with a history of colon cancer, presents for a follow-up visit. They have recently experienced significant life stressors, including the loss of his spouse. They report no physical pain but have lost 10 pounds over the past four months, which they attribute to stress and reduced appetite. They acknowledge the need to maintain a good diet, especially with the upcoming restart of chemotherapy.  The patient's colon cancer has metastasized to the bone, specifically the hip, which is at risk of fracture. They report no pain in the hip area. They are scheduled to restart chemotherapy, which previously caused them some discomfort. The patient has been prescribed oxaliplatin for infusion and capecitabine, an oral medication. They have not yet started the oral medication but plan to do so.  The patient has been seeing a dentist and reports no dental problems. They are advised to get a clearance letter from their dentist to start Azelate, a medication to strengthen the bones and prevent fractures.         All other systems were reviewed with the patient and are negative.  MEDICAL HISTORY:  Past Medical  History:  Diagnosis Date   Alcohol abuse    Cancer (HCC)    GERD (gastroesophageal reflux disease)    Seizure (HCC)     SURGICAL HISTORY: Past Surgical History:  Procedure Laterality Date   BIOPSY  07/22/2021   Procedure: BIOPSY;  Surgeon: Kathi Der, MD;  Location: [facility];  Service: Gastroenterology;;   COLON SURGERY     COLONOSCOPY WITH PROPOFOL N/A 07/22/2021   Procedure: COLONOSCOPY WITH PROPOFOL;  Surgeon: Kathi Der, MD;  Location: [facility];  Service: Gastroenterology;  Laterality: N/A;   IR IMAGING GUIDED PORT INSERTION  10/17/2022   NO PAST SURGERIES     SUBMUCOSAL TATTOO INJECTION  07/22/2021   Procedure: SUBMUCOSAL TATTOO INJECTION;  Surgeon: Kathi Der, MD;  Location: [facility];  Service: Gastroenterology;;    I have reviewed the social history and family history with the patient and they are unchanged from previous note.  ALLERGIES:  is allergic to latex and morphine and codeine.  MEDICATIONS:  Current Outpatient Medications  Medication Sig Dispense Refill   capecitabine (XELODA) 500 MG tablet Take 3 tablets (1,500 mg total) by mouth 2 (two) times daily after a meal. Take for 14 days on, then 7 days off. Repeat every 21 days. Start with IV chemo 84 tablet 1   carvedilol (COREG) 25 MG tablet Take 1 tablet (25 mg total) by mouth 2 (two) times daily with a meal. 30 tablet 0   cyclobenzaprine (FLEXERIL) 10 MG tablet Take 10-20 mg by mouth 2 (two) times daily as needed for muscle spasms.     dexamethasone (DECADRON) 4 MG tablet Take 1  tablet (4 mg total) by mouth daily. Take for 3-5 days after chemo for nausea and fatigue 20 tablet 1   dexamethasone (DECADRON) 4 MG tablet Take 2 tablets (8 mg total) by mouth daily. Start the day after chemotherapy for 2 days. Take with food. 30 tablet 1   diphenoxylate-atropine (LOMOTIL) 2.5-0.025 MG tablet Take 1 tablet by mouth 4 (four) times daily as needed for diarrhea or loose stools. (Patient not taking:  Reported on 01/07/2023) 30 tablet 0   escitalopram (LEXAPRO) 10 MG tablet Take 10 mg by mouth daily.     ferrous sulfate 325 (65 FE) MG tablet Take 1 tablet (325 mg total) by mouth 2 (two) times daily with a meal. (Patient not taking: Reported on 01/07/2023) 100 tablet 3   gabapentin (NEURONTIN) 300 MG capsule Take 300 mg by mouth 3 (three) times daily.     HYDROcodone-acetaminophen (NORCO/VICODIN) 5-325 MG tablet Take 1 tablet by mouth every 6 (six) hours as needed for moderate pain.     lidocaine-prilocaine (EMLA) cream Apply to affected area once 30 g 3   magic mouthwash (nystatin, lidocaine, diphenhydrAMINE, alum & mag hydroxide) suspension Take 5 mLs by mouth 3 (three) times daily as needed for mouth pain. (Patient not taking: Reported on 01/07/2023) 140 mL 1   ondansetron (ZOFRAN) 8 MG tablet Take 1 tablet (8 mg total) by mouth every 8 (eight) hours as needed for nausea or vomiting. Start on the third day after chemotherapy. 30 tablet 1   oxyCODONE (ROXICODONE) 5 MG immediate release tablet Take 1 tablet (5 mg total) by mouth every 6 (six) hours as needed for severe pain. (Patient not taking: Reported on 01/07/2023) 20 tablet 0   Oxycodone HCl 10 MG TABS Take 10 mg by mouth every 6 (six) hours as needed.     polyethylene glycol (MIRALAX / GLYCOLAX) 17 g packet Take 17 g by mouth daily as needed for mild constipation or moderate constipation. (Patient not taking: Reported on 01/07/2023) 14 each 0   potassium chloride SA (KLOR-CON M) 20 MEQ tablet Take 1 tablet (20 mEq total) by mouth daily for 5 days. 30 tablet 0   prochlorperazine (COMPAZINE) 10 MG tablet Take 1 tablet (10 mg total) by mouth every 6 (six) hours as needed for nausea or vomiting. 30 tablet 1   promethazine (PHENERGAN) 25 MG tablet Take 1 tablet (25 mg total) by mouth every 6 (six) hours as needed for nausea or vomiting. 30 tablet 1   No current facility-administered medications for this visit.   Facility-Administered Medications Ordered  in Other Visits  Medication Dose Route Frequency Provider Last Rate Last Admin   bevacizumab-adcd (VEGZELMA) 600 mg in sodium chloride 0.9 % 100 mL chemo infusion  7.5 mg/kg (Order-Specific) Intravenous Once Malachy Mood, MD       dexamethasone (DECADRON) 10 mg in sodium chloride 0.9 % 50 mL IVPB  10 mg Intravenous Once Malachy Mood, MD       dextrose 5 % solution   Intravenous Once Malachy Mood, MD       diphenhydrAMINE (BENADRYL) injection 25 mg  25 mg Intravenous Once Malachy Mood, MD       famotidine (PEPCID) IVPB 20 mg premix  20 mg Intravenous Once Malachy Mood, MD       heparin lock flush 100 unit/mL  500 Units Intracatheter Once PRN Malachy Mood, MD       oxaliplatin (ELOXATIN) 200 mg in dextrose 5 % 500 mL chemo infusion  90 mg/m2 (Order-Specific)  Intravenous Once Malachy Mood, MD       palonosetron Joylene John) injection 0.25 mg  0.25 mg Intravenous Once Malachy Mood, MD       sodium chloride flush (NS) 0.9 % injection 10 mL  10 mL Intracatheter PRN Malachy Mood, MD        PHYSICAL EXAMINATION: ECOG PERFORMANCE STATUS: 0 - Asymptomatic  Vitals:   05/25/23 0918  BP: 106/66  Pulse: 75  Resp: 15  Temp: 98.9 F (37.2 C)  SpO2: 98%   Wt Readings from Last 3 Encounters:  05/25/23 172 lb (78 kg)  04/22/23 182 lb 8 oz (82.8 kg)  01/10/23 182 lb 5.1 oz (82.7 kg)     GENERAL:alert, no distress and comfortable SKIN: skin color, texture, turgor are normal, no rashes or significant lesions EYES: normal, Conjunctiva are pink and non-injected, sclera clear NECK: supple, thyroid normal size, non-tender, without nodularity LYMPH:  no palpable lymphadenopathy in the cervical, axillary  LUNGS: clear to auscultation and percussion with normal breathing effort HEART: regular rate & rhythm and no murmurs and no lower extremity edema ABDOMEN:abdomen soft, non-tender and normal bowel sounds Musculoskeletal:no cyanosis of digits and no clubbing, no pain or tenderness  NEURO: alert & oriented x 3 with fluent speech, no  focal motor/sensory deficits    LABORATORY DATA:  I have reviewed the data as listed    Latest Ref Rng & Units 05/25/2023    8:57 AM 04/22/2023    1:57 PM 01/10/2023    5:41 AM  CBC  WBC 4.0 - 10.5 K/uL 5.6  5.6  7.9   Hemoglobin 13.0 - 17.0 g/dL 01.0  93.2  9.1   Hematocrit 39.0 - 52.0 % 36.1  41.4  27.7   Platelets 150 - 400 K/uL 321  286  272         Latest Ref Rng & Units 05/25/2023    8:57 AM 04/22/2023    1:57 PM 01/10/2023    5:41 AM  CMP  Glucose 70 - 99 mg/dL 355  97  732   BUN 6 - 20 mg/dL 11  11  6    Creatinine 0.61 - 1.24 mg/dL 2.02  5.42  7.06   Sodium 135 - 145 mmol/L 138  138  138   Potassium 3.5 - 5.1 mmol/L 3.8  3.9  3.5   Chloride 98 - 111 mmol/L 103  101  106   CO2 22 - 32 mmol/L 29  28  25    Calcium 8.9 - 10.3 mg/dL 9.5  9.7  8.4   Total Protein 6.5 - 8.1 g/dL 8.2  8.8    Total Bilirubin 0.3 - 1.2 mg/dL 0.4  0.4    Alkaline Phos 38 - 126 U/L 204  174    AST 15 - 41 U/L 18  19    ALT 0 - 44 U/L 7  10        RADIOGRAPHIC STUDIES: I have personally reviewed the radiological images as listed and agreed with the findings in the report. No results found.    Orders Placed This Encounter  Procedures   NM PET Image Initial (PI) Skull Base To Thigh    Standing Status:   Future    Standing Expiration Date:   05/24/2024    Order Specific Question:   If indicated for the ordered procedure, I authorize the administration of a radiopharmaceutical per Radiology protocol    Answer:   Yes    Order Specific Question:   Preferred imaging  location?    Answer:   [facility]    Order Specific Question:   Release to patient    Answer:   Immediate   CBC with Differential (Cancer Center Only)    Standing Status:   Future    Standing Expiration Date:   07/05/2024   CMP (Cancer Center only)    Standing Status:   Future    Standing Expiration Date:   07/05/2024   Total Protein, Urine dipstick    Standing Status:   Future    Standing Expiration Date:   07/05/2024   CBC  with Differential (Cancer Center Only)    Standing Status:   Future    Standing Expiration Date:   07/26/2024   CMP (Cancer Center only)    Standing Status:   Future    Standing Expiration Date:   07/26/2024   CBC with Differential (Cancer Center Only)    Standing Status:   Future    Standing Expiration Date:   08/16/2024   CMP (Cancer Center only)    Standing Status:   Future    Standing Expiration Date:   08/16/2024   Ambulatory Referral to [facility] Nutrition    Referral Priority:   Urgent    Referral Type:   Consultation    Referral Reason:   Specialty Services Required    Number of Visits Requested:   1   All questions were answered. The patient knows to call the clinic with any problems, questions or concerns. No barriers to learning was detected. The total time spent in the appointment was 30 minutes.     Malachy Mood, MD 05/25/2023

## 2023-05-25 NOTE — Patient Instructions (Signed)
[place] [facility]  Discharge Instructions: Thank you for choosing [facility] to provide your oncology and hematology care.   If you have a lab appointment with the Cancer Center, please go directly to the Cancer Center and check in at the registration area.   Wear comfortable clothing and clothing appropriate for easy access to any Portacath or PICC line.   We strive to give you quality time with your provider. You may need to reschedule your appointment if you arrive late (15 or more minutes).  Arriving late affects you and other patients whose appointments are after yours.  Also, if you miss three or more appointments without notifying the office, you may be dismissed from the clinic at the provider's discretion.      For prescription refill requests, have your pharmacy contact our office and allow 72 hours for refills to be completed.    Today you received the following chemotherapy and/or immunotherapy agents BEVACIZUMAB and OXALIPLATIN  To help prevent nausea and vomiting after your treatment, we encourage you to take your nausea medication as directed.  BELOW ARE SYMPTOMS THAT SHOULD BE REPORTED IMMEDIATELY: *FEVER GREATER THAN 100.4 F (38 C) OR HIGHER *CHILLS OR SWEATING *NAUSEA AND VOMITING THAT IS NOT CONTROLLED WITH YOUR NAUSEA MEDICATION *UNUSUAL SHORTNESS OF BREATH *UNUSUAL BRUISING OR BLEEDING *URINARY PROBLEMS (pain or burning when urinating, or frequent urination) *BOWEL PROBLEMS (unusual diarrhea, constipation, pain near the anus) TENDERNESS IN MOUTH AND THROAT WITH OR WITHOUT PRESENCE OF ULCERS (sore throat, sores in mouth, or a toothache) UNUSUAL RASH, SWELLING OR PAIN  UNUSUAL VAGINAL DISCHARGE OR ITCHING   Items with * indicate a potential emergency and should be followed up as soon as possible or go to the Emergency Department if any problems should occur.  Please show the CHEMOTHERAPY ALERT CARD or IMMUNOTHERAPY ALERT  CARD at check-in to the Emergency Department and triage nurse.  Should you have questions after your visit or need to cancel or reschedule your appointment, please contact [place] [facility]  Dept: [phone]  and follow the prompts.  Office hours are 8:00 a.m. to 4:30 p.m. Monday - Friday. Please note that voicemails left after 4:00 p.m. may not be returned until the following business day.  We are closed weekends and major holidays. You have access to a nurse at all times for urgent questions. Please call the main number to the clinic Dept: [phone] and follow the prompts.   For any non-urgent questions, you may also contact your provider using MyChart. We now offer e-Visits for anyone 51 and older to request care online for non-urgent symptoms. For details visit [email or web address].   Also download the MyChart app! Go to the app store, search "MyChart", open the app, select [facility], and log in with your MyChart username and password.  Bevacizumab Injection What is this medication? BEVACIZUMAB (be va SIZ yoo mab) treats some types of cancer. It works by blocking a protein that causes cancer cells to grow and multiply. This helps to slow or stop the spread of cancer cells. It is a monoclonal antibody. This medicine may be used for other purposes; ask your health care provider or pharmacist if you have questions. COMMON BRAND NAME(S): Alymsys, Avastin, MVASI, Omer Jack What should I tell my care team before I take this medication? They need to know if you have any of these conditions: Blood clots Coughing up blood Having or recent surgery Heart failure High  blood pressure History of a connection between 2 or more body parts that do not usually connect (fistula) History of a tear in your stomach or intestines Protein in your urine An unusual or allergic reaction to bevacizumab, other medications, foods, dyes, or preservatives Pregnant or trying to get  pregnant Breast-feeding How should I use this medication? This medication is injected into a vein. It is given by your care team in a hospital or clinic setting. Talk to your care team the use of this medication in children. Special care may be needed. Overdosage: If you think you have taken too much of this medicine contact a poison control center or emergency room at once. NOTE: This medicine is only for you. Do not share this medicine with others. What if I miss a dose? Keep appointments for follow-up doses. It is important not to miss your dose. Call your care team if you are unable to keep an appointment. What may interact with this medication? Interactions are not expected. This list may not describe all possible interactions. Give your health care provider a list of all the medicines, herbs, non-prescription drugs, or dietary supplements you use. Also tell them if you smoke, drink alcohol, or use illegal drugs. Some items may interact with your medicine. What should I watch for while using this medication? Your condition will be monitored carefully while you are receiving this medication. You may need blood work while taking this medication. This medication may make you feel generally unwell. This is not uncommon as chemotherapy can affect healthy cells as well as cancer cells. Report any side effects. Continue your course of treatment even though you feel ill unless your care team tells you to stop. This medication may increase your risk to bruise or bleed. Call your care team if you notice any unusual bleeding. Before having surgery, talk to your care team to make sure it is ok. This medication can increase the risk of poor healing of your surgical site or wound. You will need to stop this medication for 28 days before surgery. After surgery, wait at least 28 days before restarting this medication. Make sure the surgical site or wound is healed enough before restarting this medication. Talk  to your care team if questions. Talk to your care team if you may be pregnant. Serious birth defects can occur if you take this medication during pregnancy and for 6 months after the last dose. Contraception is recommended while taking this medication and for 6 months after the last dose. Your care team can help you find the option that works for you. Do not breastfeed while taking this medication and for 6 months after the last dose. This medication can cause infertility. Talk to your care team if you are concerned about your fertility. What side effects may I notice from receiving this medication? Side effects that you should report to your care team as soon as possible: Allergic reactions--skin rash, itching, hives, swelling of the face, lips, tongue, or throat Bleeding--bloody or black, tar-like stools, vomiting blood or brown material that looks like coffee grounds, red or dark brown urine, small red or purple spots on skin, unusual bruising or bleeding Blood clot--pain, swelling, or warmth in the leg, shortness of breath, chest pain Heart attack--pain or tightness in the chest, shoulders, arms, or jaw, nausea, shortness of breath, cold or clammy skin, feeling faint or lightheaded Heart failure--shortness of breath, swelling of the ankles, feet, or hands, sudden weight gain, unusual weakness or fatigue Increase  in blood pressure Infection--fever, chills, cough, sore throat, wounds that don't heal, pain or trouble when passing urine, general feeling of discomfort or being unwell Infusion reactions--chest pain, shortness of breath or trouble breathing, feeling faint or lightheaded Kidney injury--decrease in the amount of urine, swelling of the ankles, hands, or feet Stomach pain that is severe, does not go away, or gets worse Stroke--sudden numbness or weakness of the face, arm, or leg, trouble speaking, confusion, trouble walking, loss of balance or coordination, dizziness, severe headache,  change in vision Sudden and severe headache, confusion, change in vision, seizures, which may be signs of posterior reversible encephalopathy syndrome (PRES) Side effects that usually do not require medical attention (report to your care team if they continue or are bothersome): Back pain Change in taste Diarrhea Dry skin Increased tears Nosebleed This list may not describe all possible side effects. Call your doctor for medical advice about side effects. You may report side effects to FDA at 1-800-FDA-1088. Where should I keep my medication? This medication is given in a hospital or clinic. It will not be stored at home. NOTE: This sheet is a summary. It may not cover all possible information. If you have questions about this medicine, talk to your doctor, pharmacist, or health care provider.  2024 Elsevier/Gold Standard (2022-01-03 00:00:00)

## 2023-05-26 ENCOUNTER — Other Ambulatory Visit: Payer: Self-pay

## 2023-05-26 ENCOUNTER — Encounter: Payer: Self-pay | Admitting: Nurse Practitioner

## 2023-05-26 ENCOUNTER — Encounter: Payer: Self-pay | Admitting: Hematology

## 2023-05-26 ENCOUNTER — Telehealth: Payer: Self-pay

## 2023-05-26 DIAGNOSIS — E860 Dehydration: Secondary | ICD-10-CM

## 2023-05-26 DIAGNOSIS — C189 Malignant neoplasm of colon, unspecified: Secondary | ICD-10-CM

## 2023-05-26 DIAGNOSIS — E43 Unspecified severe protein-calorie malnutrition: Secondary | ICD-10-CM

## 2023-05-26 MED ORDER — PROMETHAZINE HCL 25 MG PO TABS: 25.0000 mg | ORAL_TABLET | Freq: Four times a day (QID) | ORAL | 1 refills | Status: DC | PRN

## 2023-05-26 NOTE — Telephone Encounter (Signed)
-----   Message from Nurse Karn Pickler sent at 05/25/2023  2:24 PM EDT ----- Regarding: Dr. Mosetta Putt 1st tx f/u call Dr. Mosetta Putt 1st tx f/u call. Restarted Oxaliplatin. Beva cizumab added to treatment plan

## 2023-05-26 NOTE — Telephone Encounter (Signed)
Called pt to see how he did with his treatment yesterday.  He reports having a h/a that started after his chemo yesterday & is not better.  He has been taking advil.  Informed not to take advil any more or any NAIDS or aspirin, only tylenol & explained that these drugs can cause bleeding along with the immunotherapy.  He also has nausea & states compazine is not helping.  Asked if can check his BP.  He states he can go to [facility].  He denies any bleeding.  He didn't p/u his decadron & says pharmacy didn't have.  Called & verified that pharmacy has this script.  Pt is to p/u.  He knows how to reach [place] if needed & knows his appts. Message routed to MD/Pod RN.

## 2023-05-26 NOTE — Telephone Encounter (Signed)
Patient called regarding ongoing nausea and headache following chemotherapy treatment on 9/23.  Patient notes that tylenol has not helped his headache and compazine has not helped in controlling his nausea. Patient does note that he has not been able to take dexamethasone as it was not available when he went to pick-up his medications.  Confirmed with patient's [facility] pharmacy that dexamethasone was ready for pick-up. Patient encouraged to pick-up and begin taking dexamethasone as this will help alleviate his symptoms. Patient is also scheduled for IV fluids and antiemetics tomorrow at 2 PM in our infusion suite. Patient agreeable to the appointment and confirmed time.  Dr. Mosetta Putt aware and orders entered.  Phenergan has also been re-ordered and sent to patient's preferred pharmacy.  Patient knows to callback should he have any additional questions or concerns.

## 2023-05-26 NOTE — Telephone Encounter (Signed)
Attempted to contact patient regarding enrollment for topical diclofenac study. Unable to leave voicemail.     Jerry Caras, PharmD PGY2 Oncology Pharmacy Resident   05/26/2023 4:44 PM

## 2023-05-27 ENCOUNTER — Inpatient Hospital Stay: Payer: Medicaid Other

## 2023-05-28 ENCOUNTER — Other Ambulatory Visit: Payer: Self-pay

## 2023-05-29 ENCOUNTER — Other Ambulatory Visit: Payer: Self-pay

## 2023-06-01 ENCOUNTER — Other Ambulatory Visit: Payer: Self-pay

## 2023-06-02 ENCOUNTER — Other Ambulatory Visit: Payer: Self-pay

## 2023-06-05 ENCOUNTER — Other Ambulatory Visit (HOSPITAL_COMMUNITY): Payer: Self-pay

## 2023-06-08 DIAGNOSIS — M542 Cervicalgia: Secondary | ICD-10-CM | POA: Diagnosis not present

## 2023-06-08 DIAGNOSIS — G894 Chronic pain syndrome: Secondary | ICD-10-CM | POA: Diagnosis not present

## 2023-06-08 DIAGNOSIS — M5416 Radiculopathy, lumbar region: Secondary | ICD-10-CM | POA: Diagnosis not present

## 2023-06-08 DIAGNOSIS — M5136 Other intervertebral disc degeneration, lumbar region with discogenic back pain only: Secondary | ICD-10-CM | POA: Diagnosis not present

## 2023-06-08 DIAGNOSIS — C229 Malignant neoplasm of liver, not specified as primary or secondary: Secondary | ICD-10-CM | POA: Diagnosis not present

## 2023-06-08 DIAGNOSIS — F1910 Other psychoactive substance abuse, uncomplicated: Secondary | ICD-10-CM | POA: Diagnosis not present

## 2023-06-08 DIAGNOSIS — C189 Malignant neoplasm of colon, unspecified: Secondary | ICD-10-CM | POA: Diagnosis not present

## 2023-06-08 DIAGNOSIS — Z79891 Long term (current) use of opiate analgesic: Secondary | ICD-10-CM | POA: Diagnosis not present

## 2023-06-12 MED FILL — Fosaprepitant Dimeglumine For IV Infusion 150 MG (Base Eq): INTRAVENOUS | Qty: 5 | Status: AC

## 2023-06-12 MED FILL — Dexamethasone Sodium Phosphate Inj 100 MG/10ML: INTRAMUSCULAR | Qty: 1 | Status: AC

## 2023-06-15 ENCOUNTER — Inpatient Hospital Stay: Payer: Medicaid Other | Attending: Nurse Practitioner

## 2023-06-15 ENCOUNTER — Inpatient Hospital Stay (HOSPITAL_BASED_OUTPATIENT_CLINIC_OR_DEPARTMENT_OTHER): Payer: Medicaid Other | Admitting: Hematology

## 2023-06-15 ENCOUNTER — Other Ambulatory Visit: Payer: Self-pay

## 2023-06-15 ENCOUNTER — Inpatient Hospital Stay: Payer: Medicaid Other

## 2023-06-15 ENCOUNTER — Other Ambulatory Visit: Payer: Medicaid Other

## 2023-06-15 ENCOUNTER — Encounter: Payer: Self-pay | Admitting: Hematology

## 2023-06-15 VITALS — BP 141/95 | HR 87 | Temp 98.1°F | Resp 16 | Ht 69.0 in | Wt 169.5 lb

## 2023-06-15 DIAGNOSIS — F1010 Alcohol abuse, uncomplicated: Secondary | ICD-10-CM | POA: Insufficient documentation

## 2023-06-15 DIAGNOSIS — K219 Gastro-esophageal reflux disease without esophagitis: Secondary | ICD-10-CM | POA: Insufficient documentation

## 2023-06-15 DIAGNOSIS — R630 Anorexia: Secondary | ICD-10-CM | POA: Diagnosis not present

## 2023-06-15 DIAGNOSIS — Z79899 Other long term (current) drug therapy: Secondary | ICD-10-CM | POA: Insufficient documentation

## 2023-06-15 DIAGNOSIS — Z7952 Long term (current) use of systemic steroids: Secondary | ICD-10-CM | POA: Diagnosis not present

## 2023-06-15 DIAGNOSIS — R112 Nausea with vomiting, unspecified: Secondary | ICD-10-CM | POA: Insufficient documentation

## 2023-06-15 DIAGNOSIS — D500 Iron deficiency anemia secondary to blood loss (chronic): Secondary | ICD-10-CM

## 2023-06-15 DIAGNOSIS — Z5111 Encounter for antineoplastic chemotherapy: Secondary | ICD-10-CM | POA: Insufficient documentation

## 2023-06-15 DIAGNOSIS — C182 Malignant neoplasm of ascending colon: Secondary | ICD-10-CM | POA: Insufficient documentation

## 2023-06-15 DIAGNOSIS — C7951 Secondary malignant neoplasm of bone: Secondary | ICD-10-CM | POA: Diagnosis not present

## 2023-06-15 DIAGNOSIS — Z95828 Presence of other vascular implants and grafts: Secondary | ICD-10-CM

## 2023-06-15 LAB — CBC WITH DIFFERENTIAL (CANCER CENTER ONLY)
Abs Immature Granulocytes: 0.01 10*3/uL (ref 0.00–0.07)
Basophils Absolute: 0 10*3/uL (ref 0.0–0.1)
Basophils Relative: 0 %
Eosinophils Absolute: 0.1 10*3/uL (ref 0.0–0.5)
Eosinophils Relative: 2 %
HCT: 35.2 % — ABNORMAL LOW (ref 39.0–52.0)
Hemoglobin: 10.9 g/dL — ABNORMAL LOW (ref 13.0–17.0)
Immature Granulocytes: 0 %
Lymphocytes Relative: 35 %
Lymphs Abs: 2 10*3/uL (ref 0.7–4.0)
MCH: 26.6 pg (ref 26.0–34.0)
MCHC: 31 g/dL (ref 30.0–36.0)
MCV: 85.9 fL (ref 80.0–100.0)
Monocytes Absolute: 0.5 10*3/uL (ref 0.1–1.0)
Monocytes Relative: 9 %
Neutro Abs: 3.1 10*3/uL (ref 1.7–7.7)
Neutrophils Relative %: 54 %
Platelet Count: 380 10*3/uL (ref 150–400)
RBC: 4.1 MIL/uL — ABNORMAL LOW (ref 4.22–5.81)
RDW: 14.6 % (ref 11.5–15.5)
WBC Count: 5.8 10*3/uL (ref 4.0–10.5)
nRBC: 0 % (ref 0.0–0.2)

## 2023-06-15 LAB — CMP (CANCER CENTER ONLY)
ALT: 8 U/L (ref 0–44)
AST: 16 U/L (ref 15–41)
Albumin: 4.2 g/dL (ref 3.5–5.0)
Alkaline Phosphatase: 239 U/L — ABNORMAL HIGH (ref 38–126)
Anion gap: 7 (ref 5–15)
BUN: 10 mg/dL (ref 6–20)
CO2: 29 mmol/L (ref 22–32)
Calcium: 9.5 mg/dL (ref 8.9–10.3)
Chloride: 101 mmol/L (ref 98–111)
Creatinine: 0.81 mg/dL (ref 0.61–1.24)
GFR, Estimated: >60 mL/min (ref >60)
Glucose, Bld: 111 mg/dL — ABNORMAL HIGH (ref 70–99)
Potassium: 3.8 mmol/L (ref 3.5–5.1)
Sodium: 137 mmol/L (ref 135–145)
Total Bilirubin: 0.4 mg/dL (ref 0.3–1.2)
Total Protein: 8.5 g/dL — ABNORMAL HIGH (ref 6.5–8.1)

## 2023-06-15 MED ORDER — SODIUM CHLORIDE 0.9 % IV SOLN
150.0000 mg | Freq: Once | INTRAVENOUS | Status: AC
  Administered 2023-06-15: 150 mg via INTRAVENOUS
  Filled 2023-06-15: qty 150

## 2023-06-15 MED ORDER — ONDANSETRON HCL 8 MG PO TABS: 8.0000 mg | ORAL_TABLET | Freq: Three times a day (TID) | ORAL | 2 refills | Status: DC | PRN

## 2023-06-15 MED ORDER — SODIUM CHLORIDE 0.9 % IV SOLN: Freq: Once | INTRAVENOUS | Status: AC

## 2023-06-15 MED ORDER — DIPHENHYDRAMINE HCL 50 MG/ML IJ SOLN
25.0000 mg | Freq: Once | INTRAMUSCULAR | Status: AC
  Administered 2023-06-15: 25 mg via INTRAVENOUS
  Filled 2023-06-15: qty 1

## 2023-06-15 MED ORDER — SODIUM CHLORIDE 0.9% FLUSH
10.0000 mL | INTRAVENOUS | Status: DC | PRN
  Administered 2023-06-15: 10 mL

## 2023-06-15 MED ORDER — DEXAMETHASONE 4 MG PO TABS: 8.0000 mg | ORAL_TABLET | Freq: Every day | ORAL | 1 refills | Status: DC

## 2023-06-15 MED ORDER — SODIUM CHLORIDE 0.9 % IV SOLN
7.5000 mg/kg | Freq: Once | INTRAVENOUS | Status: AC
  Administered 2023-06-15: 600 mg via INTRAVENOUS
  Filled 2023-06-15: qty 16

## 2023-06-15 MED ORDER — CAPECITABINE 500 MG PO TABS
850.0000 mg/m2 | ORAL_TABLET | Freq: Two times a day (BID) | ORAL | 1 refills | Status: DC
  Filled 2023-06-15: qty 84, 14d supply, fill #0

## 2023-06-15 MED ORDER — OXALIPLATIN CHEMO INJECTION 100 MG/20ML
175.0000 mg | Freq: Once | INTRAVENOUS | Status: AC
  Administered 2023-06-15: 175 mg via INTRAVENOUS
  Filled 2023-06-15: qty 35

## 2023-06-15 MED ORDER — PALONOSETRON HCL INJECTION 0.25 MG/5ML
0.2500 mg | Freq: Once | INTRAVENOUS | Status: AC
  Administered 2023-06-15: 0.25 mg via INTRAVENOUS
  Filled 2023-06-15: qty 5

## 2023-06-15 MED ORDER — PROCHLORPERAZINE MALEATE 10 MG PO TABS: 10.0000 mg | ORAL_TABLET | Freq: Four times a day (QID) | ORAL | 2 refills | Status: DC | PRN

## 2023-06-15 MED ORDER — SODIUM CHLORIDE 0.9% FLUSH: 10.0000 mL | Freq: Once | INTRAVENOUS | Status: DC

## 2023-06-15 MED ORDER — FAMOTIDINE IN NACL 20-0.9 MG/50ML-% IV SOLN
20.0000 mg | Freq: Once | INTRAVENOUS | Status: AC
  Administered 2023-06-15: 20 mg via INTRAVENOUS
  Filled 2023-06-15: qty 50

## 2023-06-15 MED ORDER — HEPARIN SOD (PORK) LOCK FLUSH 100 UNIT/ML IV SOLN
500.0000 [IU] | Freq: Once | INTRAVENOUS | Status: AC | PRN
  Administered 2023-06-15: 500 [IU]

## 2023-06-15 MED ORDER — SODIUM CHLORIDE 0.9 % IV SOLN
10.0000 mg | Freq: Once | INTRAVENOUS | Status: AC
  Administered 2023-06-15: 10 mg via INTRAVENOUS
  Filled 2023-06-15: qty 10

## 2023-06-15 MED ORDER — DEXTROSE 5 % IV SOLN: Freq: Once | INTRAVENOUS | Status: AC

## 2023-06-15 NOTE — Patient Instructions (Signed)
[place] [facility]  Discharge Instructions: Thank you for choosing [facility] to provide your oncology and hematology care.   If you have a lab appointment with the Cancer Center, please go directly to the Cancer Center and check in at the registration area.   Wear comfortable clothing and clothing appropriate for easy access to any Portacath or PICC line.   We strive to give you quality time with your provider. You may need to reschedule your appointment if you arrive late (15 or more minutes).  Arriving late affects you and other patients whose appointments are after yours.  Also, if you miss three or more appointments without notifying the office, you may be dismissed from the clinic at the provider's discretion.      For prescription refill requests, have your pharmacy contact our office and allow 72 hours for refills to be completed.    Today you received the following chemotherapy and/or immunotherapy agents BEVACIZUMAB and OXALIPLATIN  To help prevent nausea and vomiting after your treatment, we encourage you to take your nausea medication as directed.  BELOW ARE SYMPTOMS THAT SHOULD BE REPORTED IMMEDIATELY: *FEVER GREATER THAN 100.4 F (38 C) OR HIGHER *CHILLS OR SWEATING *NAUSEA AND VOMITING THAT IS NOT CONTROLLED WITH YOUR NAUSEA MEDICATION *UNUSUAL SHORTNESS OF BREATH *UNUSUAL BRUISING OR BLEEDING *URINARY PROBLEMS (pain or burning when urinating, or frequent urination) *BOWEL PROBLEMS (unusual diarrhea, constipation, pain near the anus) TENDERNESS IN MOUTH AND THROAT WITH OR WITHOUT PRESENCE OF ULCERS (sore throat, sores in mouth, or a toothache) UNUSUAL RASH, SWELLING OR PAIN  UNUSUAL VAGINAL DISCHARGE OR ITCHING   Items with * indicate a potential emergency and should be followed up as soon as possible or go to the Emergency Department if any problems should occur.  Please show the CHEMOTHERAPY ALERT CARD or IMMUNOTHERAPY ALERT  CARD at check-in to the Emergency Department and triage nurse.  Should you have questions after your visit or need to cancel or reschedule your appointment, please contact [place] [facility]  Dept: [phone]  and follow the prompts.  Office hours are 8:00 a.m. to 4:30 p.m. Monday - Friday. Please note that voicemails left after 4:00 p.m. may not be returned until the following business day.  We are closed weekends and major holidays. You have access to a nurse at all times for urgent questions. Please call the main number to the clinic Dept: [phone] and follow the prompts.   For any non-urgent questions, you may also contact your provider using MyChart. We now offer e-Visits for anyone 74 and older to request care online for non-urgent symptoms. For details visit [email or web address].   Also download the MyChart app! Go to the app store, search "MyChart", open the app, select [facility], and log in with your MyChart username and password.  Bevacizumab Injection What is this medication? BEVACIZUMAB (be va SIZ yoo mab) treats some types of cancer. It works by blocking a protein that causes cancer cells to grow and multiply. This helps to slow or stop the spread of cancer cells. It is a monoclonal antibody. This medicine may be used for other purposes; ask your health care provider or pharmacist if you have questions. COMMON BRAND NAME(S): Alymsys, Avastin, MVASI, Omer Jack What should I tell my care team before I take this medication? They need to know if you have any of these conditions: Blood clots Coughing up blood Having or recent surgery Heart failure High  blood pressure History of a connection between 2 or more body parts that do not usually connect (fistula) History of a tear in your stomach or intestines Protein in your urine An unusual or allergic reaction to bevacizumab, other medications, foods, dyes, or preservatives Pregnant or trying to get  pregnant Breast-feeding How should I use this medication? This medication is injected into a vein. It is given by your care team in a hospital or clinic setting. Talk to your care team the use of this medication in children. Special care may be needed. Overdosage: If you think you have taken too much of this medicine contact a poison control center or emergency room at once. NOTE: This medicine is only for you. Do not share this medicine with others. What if I miss a dose? Keep appointments for follow-up doses. It is important not to miss your dose. Call your care team if you are unable to keep an appointment. What may interact with this medication? Interactions are not expected. This list may not describe all possible interactions. Give your health care provider a list of all the medicines, herbs, non-prescription drugs, or dietary supplements you use. Also tell them if you smoke, drink alcohol, or use illegal drugs. Some items may interact with your medicine. What should I watch for while using this medication? Your condition will be monitored carefully while you are receiving this medication. You may need blood work while taking this medication. This medication may make you feel generally unwell. This is not uncommon as chemotherapy can affect healthy cells as well as cancer cells. Report any side effects. Continue your course of treatment even though you feel ill unless your care team tells you to stop. This medication may increase your risk to bruise or bleed. Call your care team if you notice any unusual bleeding. Before having surgery, talk to your care team to make sure it is ok. This medication can increase the risk of poor healing of your surgical site or wound. You will need to stop this medication for 28 days before surgery. After surgery, wait at least 28 days before restarting this medication. Make sure the surgical site or wound is healed enough before restarting this medication. Talk  to your care team if questions. Talk to your care team if you may be pregnant. Serious birth defects can occur if you take this medication during pregnancy and for 6 months after the last dose. Contraception is recommended while taking this medication and for 6 months after the last dose. Your care team can help you find the option that works for you. Do not breastfeed while taking this medication and for 6 months after the last dose. This medication can cause infertility. Talk to your care team if you are concerned about your fertility. What side effects may I notice from receiving this medication? Side effects that you should report to your care team as soon as possible: Allergic reactions--skin rash, itching, hives, swelling of the face, lips, tongue, or throat Bleeding--bloody or black, tar-like stools, vomiting blood or brown material that looks like coffee grounds, red or dark brown urine, small red or purple spots on skin, unusual bruising or bleeding Blood clot--pain, swelling, or warmth in the leg, shortness of breath, chest pain Heart attack--pain or tightness in the chest, shoulders, arms, or jaw, nausea, shortness of breath, cold or clammy skin, feeling faint or lightheaded Heart failure--shortness of breath, swelling of the ankles, feet, or hands, sudden weight gain, unusual weakness or fatigue Increase  in blood pressure Infection--fever, chills, cough, sore throat, wounds that don't heal, pain or trouble when passing urine, general feeling of discomfort or being unwell Infusion reactions--chest pain, shortness of breath or trouble breathing, feeling faint or lightheaded Kidney injury--decrease in the amount of urine, swelling of the ankles, hands, or feet Stomach pain that is severe, does not go away, or gets worse Stroke--sudden numbness or weakness of the face, arm, or leg, trouble speaking, confusion, trouble walking, loss of balance or coordination, dizziness, severe headache,  change in vision Sudden and severe headache, confusion, change in vision, seizures, which may be signs of posterior reversible encephalopathy syndrome (PRES) Side effects that usually do not require medical attention (report to your care team if they continue or are bothersome): Back pain Change in taste Diarrhea Dry skin Increased tears Nosebleed This list may not describe all possible side effects. Call your doctor for medical advice about side effects. You may report side effects to FDA at 1-800-FDA-1088. Where should I keep my medication? This medication is given in a hospital or clinic. It will not be stored at home. NOTE: This sheet is a summary. It may not cover all possible information. If you have questions about this medicine, talk to your doctor, pharmacist, or health care provider.  2024 Elsevier/Gold Standard (2022-01-03 00:00:00)

## 2023-06-15 NOTE — Progress Notes (Signed)
Received staff message from Alphonse Guild (Nutritionist Team at [facility]) stating pt would like to get a prescription for free oral supplementation.  Dr. Mosetta Putt was included on the staff message.  Dr. Mosetta Putt wrote "written" prescription for Boost Plus TID.  Prescription, last office note and nutritionist note, and pt demographics faxed to [facility] [phone]).  Fax confirmation received.

## 2023-06-15 NOTE — Progress Notes (Signed)
[facility]   [phone] [phone]   Clinic Follow up Note   Patient Care Team: Frederic Jericho, [name] as PCP - General (Physician Assistant) Axel Filler, MD as Consulting Physician (General Surgery) Pollyann Samples, NP as Nurse Practitioner (Nurse Practitioner) Malachy Mood, MD as Consulting Physician (Hematology) Ewing Schlein, MD as Referring Physician (Pain Medicine)  Date of Service:  06/15/2023  CHIEF COMPLAINT: f/u of metastatic colon cancer  CURRENT THERAPY:  chemotherapy CapeOx and bevacizumab every 3 weeks  Oncology History   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 Derrell Lolling 07/23/21, final path showed: pT4a lesion with LVI and PNI, no perforation; clear margins; 9/19 positive LNs, pN2b --He received Capox 09/11/21 - 01/15/22, oxali discontinued due to significant nausea/vomiting and headaches. He completed Xeloda in late June 2023.  -He unfortunately developed oligo liver metastasis based on PET/CT in 08/2022, liver biopsy was attempted but did not feasible.  -Guardian review was positive for circulating tumor DNA, consistent with recurrence. -Patient requested second opinion of surgery, was seen at [facility].  Neoadjuvant chemotherapy recommended before surgical resection, and he agreed. -He started chemotherapy FOLFIRI on 10/20/2022, no bevacizumab due to pending surgery  -NGS Tempus showed no targetable therapy, KRAS/NRAS/BRAF wild type, would not recommend EGFR inhibitor for first line due to his primary right side colon cancer. -he completed 3 months chemo in April 2024.  He was referred to liver surgeon Dr. Gwenlyn Perking at [facility], for patient's personal issue, his surgery has been postponed until recently.  Unfortunately his recent CT scan on 04/16/23 at [facility] showed possible bone metastasis at right Acetabulum, his liver resection was  canceled.   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  He is at high risk for fracture. -I recommend restarting chemotherapy CAPOX and beva.  He started on 05/25/23.     Assessment and Plan    Metastatic Colon Cancer Undergoing chemotherapy with significant side effects including severe nausea, vomiting, and headache. Weight loss noted. Disease is treatable but not curable. -Continue chemotherapy regimen. -Ensure adequate antiemetic coverage with Compazine and other nausea medication. -Consider use of dexamethasone for nausea control and potential headache relief. -Encourage use of nutritional supplements such as Boost, particularly during first week of chemotherapy cycle. -Refer to dietician for further nutritional support and potential assistance with obtaining nutritional supplements. -Plan for imaging on 06/18/2023 to assess disease status.  Bone Metastasis with Pain Reports significant pain in hip, rating it as 7/10. Has both oxycodone and tramadol at home. -Trial of both oxycodone and tramadol to determine which provides better pain control. -Consider radiation therapy if pain continues to worsen despite chemotherapy.  Plan -Lab reviewed, adequate for treatment, will proceed second cycle CapeOx and bevacizumab today at the same dose -I called in dexamethasone for him to take after chemotherapy for anorexia  -Follow-up in 3 weeks -He will see dietitian in infusion room today, we prescribed boost for him.     SUMMARY OF ONCOLOGIC HISTORY: Oncology History Overview Note   Cancer Staging  Cancer of right colon [facility]) Staging form: Colon and Rectum, AJCC 8th Edition - Pathologic stage from 07/23/2021: Stage IIIC (pT4a, pN2b, cM0) - Signed by Pollyann Samples, NP on 08/15/2021 Stage prefix: Initial diagnosis Histologic grading system: 4 grade  system Histologic grade (G):  G2 Laterality: Right Lymph-vascular invasion (LVI): LVI present/identified, NOS Perineural invasion (PNI): Present Microsatellite instability (MSI): Stable     Cancer of right colon (HCC)  07/19/2021 Imaging   CT AP IMPRESSION: Focal area of high-grade narrowing of the proximal ascending colon may be sequela of chronic inflammation and stricture versus a mass. There is mild distension of the cecum and terminal ileum. Clinical correlation and further evaluation with lower GI study or colonoscopy recommended.   07/22/2021 Procedure   Colonoscopy A severe stenosis was found in the distal ascending colon and was non-traversed. It was ulcerated and inflamed. This could be inflammatory versus malignant stricture.   07/22/2021 Initial Biopsy   FINAL MICROSCOPIC DIAGNOSIS:  A. COLON, ASCENDING, BIOPSY:  - Invasive colorectal adenocarcinoma, moderately differentiated.    07/23/2021 Definitive Surgery   PRE-OPERATIVE DIAGNOSIS:  right colon mass POST-OPERATIVE DIAGNOSIS:  right colon mass PROCEDURE:  Procedure(s): XI ROBOT ASSISTED LAPAROSCOPIC RIGHT COLECTOMY (N/A) Dr. Derrell Lolling   07/23/2021 Pathology Results   FINAL MICROSCOPIC DIAGNOSIS:  A. COLON, RIGHT, RESECTION:  - Invasive moderately differentiated colonic adenocarcinoma, 4.5 cm.  - Carcinoma extends into pericolonic connective tissue and focally  involves serosal surface.  - Margins not involved.  - Metastatic carcinoma in nine of nineteen lymph nodes (9/19).  - Unremarkable appendix. MMR normal, MSI-stable    07/23/2021 Cancer Staging   Staging form: Colon and Rectum, AJCC 8th Edition - Pathologic stage from 07/23/2021: Stage IIIC (pT4a, pN2b, cM0) - Signed by Pollyann Samples, NP on 08/15/2021 Stage prefix: Initial diagnosis Histologic grading system: 4 grade system Histologic grade (G): G2 Laterality: Right Lymph-vascular invasion (LVI): LVI present/identified, NOS Perineural invasion  (PNI): Present Microsatellite instability (MSI): Stable   07/24/2021 Initial Diagnosis   Colon adenocarcinoma (HCC)   07/25/2021 Tumor Marker   CEA: 2.9   07/28/2021 Imaging   CT chest IMPRESSION: 1. No evidence of metastatic disease within the chest. 2. Lungs demonstrate atelectasis, most evident lower lobes and bases of the left upper lobe lingula and right middle lobe. No convincing pneumonia and no evidence of pulmonary edema.     09/11/2021 - 01/15/2022 Chemotherapy   Patient is on Treatment Plan : COLORECTAL Xelox (Capeox) q21d     08/2022 Progression   PET scan showed hypermetabolic oligo liver metastasis, biopsy attempted but liver lesion was not seen by [place]. GuardantReveal was also positive for circulating tumor DNA    09/02/2022 - 09/02/2022 Chemotherapy   Patient is on Treatment Plan : COLORECTAL FOLFIRI + Bevacizumab q14d      Genetic Testing   Negative genetic testing. No pathogenic variants identified on the [facility] Multi-Cancer+RNA panel. VUS in APC called [id number] identified. The report date is 09/15/2022.  The Multi-Cancer + RNA Panel offered by [facility] includes sequencing and/or deletion/duplication analysis of the following 70 genes:  AIP*, ALK, APC*, ATM*, AXIN2*, BAP1*, BARD1*, BLM*, BMPR1A*, BRCA1*, BRCA2*, BRIP1*, CDC73*, CDH1*, CDK4, CDKN1B*, CDKN2A, CHEK2*, CTNNA1*, DICER1*, EPCAM, EGFR, FH*, FLCN*, GREM1, HOXB13, KIT, LZTR1, MAX*, MBD4, MEN1*, MET, MITF, MLH1*, MSH2*, MSH3*, MSH6*, MUTYH*, NF1*, NF2*, NTHL1*, PALB2*, PDGFRA, PMS2*, POLD1*, POLE*, POT1*, PRKAR1A*, PTCH1*, PTEN*, RAD51C*, RAD51D*, RB1*, RET, SDHA*, SDHAF2*, SDHB*, SDHC*, SDHD*, SMAD4*, SMARCA4*, SMARCB1*, SMARCE1*, STK11*, SUFU*, TMEM127*, TP53*, TSC1*, TSC2*, VHL*. RNA analysis is performed for * genes.   10/20/2022 - 12/31/2022 Chemotherapy   Patient is on Treatment Plan : COLORECTAL FOLFIRI q14d     05/25/2023 -  Chemotherapy   Patient is on Treatment Plan : COLORECTAL CapeOx + Bevacizumab q21d  Discussed the use of AI scribe software for clinical note transcription with the patient, who gave verbal consent to proceed.  History of Present Illness   A 47 year old patient with a known diagnosis of metastatic colon cancer presents for a follow-up visit. The patient started chemotherapy three weeks ago and reports experiencing severe headaches and nausea, which lasted for about four to five days post-chemotherapy. The patient was unable to eat for about a week due to the severity of the nausea. Despite a reduction in the chemotherapy dose, the patient continued to experience these side effects. The patient also reports a significant loss of appetite and weight loss of three pounds over the past few weeks. The patient has been trying to eat more in the past two weeks but has not been able to gain weight. The patient also reports pain in the hip bone, which is a site of metastasis. The pain level is reported to be about a seven on a scale of zero to ten. The patient has been taking oxycodone and tramadol for the pain.         All other systems were reviewed with the patient and are negative.  MEDICAL HISTORY:  Past Medical History:  Diagnosis Date   Alcohol abuse    Cancer (HCC)    GERD (gastroesophageal reflux disease)    Seizure (HCC)     SURGICAL HISTORY: Past Surgical History:  Procedure Laterality Date   BIOPSY  07/22/2021   Procedure: BIOPSY;  Surgeon: Kathi Der, MD;  Location: [facility];  Service: Gastroenterology;;   COLON SURGERY     COLONOSCOPY WITH PROPOFOL N/A 07/22/2021   Procedure: COLONOSCOPY WITH PROPOFOL;  Surgeon: Kathi Der, MD;  Location: [facility];  Service: Gastroenterology;  Laterality: N/A;   IR IMAGING GUIDED PORT INSERTION  10/17/2022   NO PAST SURGERIES     SUBMUCOSAL TATTOO INJECTION  07/22/2021   Procedure: SUBMUCOSAL TATTOO INJECTION;  Surgeon: Kathi Der, MD;  Location: [facility];  Service: Gastroenterology;;     I have reviewed the social history and family history with the patient and they are unchanged from previous note.  ALLERGIES:  is allergic to latex and morphine and codeine.  MEDICATIONS:  Current Outpatient Medications  Medication Sig Dispense Refill   capecitabine (XELODA) 500 MG tablet Take 3 tablets (1,500 mg total) by mouth 2 (two) times daily after a meal. Take for 14 days on, then 7 days off. Repeat every 21 days. Start with IV chemo 84 tablet 1   carvedilol (COREG) 25 MG tablet Take 1 tablet (25 mg total) by mouth 2 (two) times daily with a meal. 30 tablet 0   cyclobenzaprine (FLEXERIL) 10 MG tablet Take 10-20 mg by mouth 2 (two) times daily as needed for muscle spasms.     dexamethasone (DECADRON) 4 MG tablet Take 1 tablet (4 mg total) by mouth daily. Take for 3-5 days after chemo for nausea and fatigue 20 tablet 1   dexamethasone (DECADRON) 4 MG tablet Take 2 tablets (8 mg total) by mouth daily. Start the day after chemotherapy for 3-5 days. Take with food. 30 tablet 1   diphenoxylate-atropine (LOMOTIL) 2.5-0.025 MG tablet Take 1 tablet by mouth 4 (four) times daily as needed for diarrhea or loose stools. (Patient not taking: Reported on 01/07/2023) 30 tablet 0   escitalopram (LEXAPRO) 10 MG tablet Take 10 mg by mouth daily.     ferrous sulfate 325 (65 FE) MG tablet Take 1 tablet (325 mg total) by mouth 2 (  two) times daily with a meal. (Patient not taking: Reported on 01/07/2023) 100 tablet 3   gabapentin (NEURONTIN) 300 MG capsule Take 300 mg by mouth 3 (three) times daily.     HYDROcodone-acetaminophen (NORCO/VICODIN) 5-325 MG tablet Take 1 tablet by mouth every 6 (six) hours as needed for moderate pain.     lidocaine-prilocaine (EMLA) cream Apply to affected area once 30 g 3   magic mouthwash (nystatin, lidocaine, diphenhydrAMINE, alum & mag hydroxide) suspension Take 5 mLs by mouth 3 (three) times daily as needed for mouth pain. (Patient not taking: Reported on 01/07/2023) 140 mL 1    ondansetron (ZOFRAN) 8 MG tablet Take 1 tablet (8 mg total) by mouth every 8 (eight) hours as needed for nausea or vomiting. Start on the third day after chemotherapy. 30 tablet 2   oxyCODONE (ROXICODONE) 5 MG immediate release tablet Take 1 tablet (5 mg total) by mouth every 6 (six) hours as needed for severe pain. (Patient not taking: Reported on 01/07/2023) 20 tablet 0   Oxycodone HCl 10 MG TABS Take 10 mg by mouth every 6 (six) hours as needed.     polyethylene glycol (MIRALAX / GLYCOLAX) 17 g packet Take 17 g by mouth daily as needed for mild constipation or moderate constipation. (Patient not taking: Reported on 01/07/2023) 14 each 0   potassium chloride SA (KLOR-CON M) 20 MEQ tablet Take 1 tablet (20 mEq total) by mouth daily for 5 days. 30 tablet 0   prochlorperazine (COMPAZINE) 10 MG tablet Take 1 tablet (10 mg total) by mouth every 6 (six) hours as needed for nausea or vomiting. 30 tablet 2   promethazine (PHENERGAN) 25 MG tablet Take 1 tablet (25 mg total) by mouth every 6 (six) hours as needed for nausea or vomiting. 30 tablet 1   No current facility-administered medications for this visit.   Facility-Administered Medications Ordered in Other Visits  Medication Dose Route Frequency Provider Last Rate Last Admin   heparin lock flush 100 unit/mL  500 Units Intracatheter Once PRN Malachy Mood, MD       oxaliplatin (ELOXATIN) 175 mg in dextrose 5 % 500 mL chemo infusion  175 mg Intravenous Once Malachy Mood, MD 268 mL/hr at 06/15/23 1200 175 mg at 06/15/23 1200   sodium chloride flush (NS) 0.9 % injection 10 mL  10 mL Intracatheter PRN Malachy Mood, MD        PHYSICAL EXAMINATION: ECOG PERFORMANCE STATUS: 1 - Symptomatic but completely ambulatory  Vitals:   06/15/23 0906  BP: (!) 141/95  Pulse: 87  Resp: 16  Temp: 98.1 F (36.7 C)  SpO2: 100%   Wt Readings from Last 3 Encounters:  06/15/23 169 lb 8 oz (76.9 kg)  05/25/23 172 lb (78 kg)  04/22/23 182 lb 8 oz (82.8 kg)     GENERAL:alert,  no distress and comfortable SKIN: skin color, texture, turgor are normal, no rashes or significant lesions EYES: normal, Conjunctiva are pink and non-injected, sclera clear NECK: supple, thyroid normal size, non-tender, without nodularity LYMPH:  no palpable lymphadenopathy in the cervical, axillary  LUNGS: clear to auscultation and percussion with normal breathing effort HEART: regular rate & rhythm and no murmurs and no lower extremity edema ABDOMEN:abdomen soft, non-tender and normal bowel sounds Musculoskeletal:no cyanosis of digits and no clubbing  NEURO: alert & oriented x 3 with fluent speech, no focal motor/sensory deficits    LABORATORY DATA:  I have reviewed the data as listed    Latest Ref Rng &  Units 06/15/2023    8:34 AM 05/25/2023    8:57 AM 04/22/2023    1:57 PM  CBC  WBC 4.0 - 10.5 K/uL 5.8  5.6  5.6   Hemoglobin 13.0 - 17.0 g/dL 16.1  09.6  04.5   Hematocrit 39.0 - 52.0 % 35.2  36.1  41.4   Platelets 150 - 400 K/uL 380  321  286         Latest Ref Rng & Units 06/15/2023    8:34 AM 05/25/2023    8:57 AM 04/22/2023    1:57 PM  CMP  Glucose 70 - 99 mg/dL 409  811  97   BUN 6 - 20 mg/dL 10  11  11    Creatinine 0.61 - 1.24 mg/dL 9.14  7.82  9.56   Sodium 135 - 145 mmol/L 137  138  138   Potassium 3.5 - 5.1 mmol/L 3.8  3.8  3.9   Chloride 98 - 111 mmol/L 101  103  101   CO2 22 - 32 mmol/L 29  29  28    Calcium 8.9 - 10.3 mg/dL 9.5  9.5  9.7   Total Protein 6.5 - 8.1 g/dL 8.5  8.2  8.8   Total Bilirubin 0.3 - 1.2 mg/dL 0.4  0.4  0.4   Alkaline Phos 38 - 126 U/L 239  204  174   AST 15 - 41 U/L 16  18  19    ALT 0 - 44 U/L 8  7  10        RADIOGRAPHIC STUDIES: I have personally reviewed the radiological images as listed and agreed with the findings in the report. No results found.    No orders of the defined types were placed in this encounter.  All questions were answered. The patient knows to call the clinic with any problems, questions or concerns. No  barriers to learning was detected. The total time spent in the appointment was 25 minutes.     Malachy Mood, MD 06/15/2023

## 2023-06-15 NOTE — Assessment & Plan Note (Signed)
Stage IIIC (pT4a, pN2b, cM0), MSS, KRAS/NRAS/BRAF wild type -diagnosed in 07/2021 -s/p partial colectomy by Dr. Derrell Lolling 07/23/21, final path showed: pT4a lesion with LVI and PNI, no perforation; clear margins; 9/19 positive LNs, pN2b --He received Capox 09/11/21 - 01/15/22, oxali discontinued due to significant nausea/vomiting and headaches. He completed Xeloda in late June 2023.  -He unfortunately developed oligo liver metastasis based on PET/CT in 08/2022, liver biopsy was attempted but did not feasible.  -Guardian review was positive for circulating tumor DNA, consistent with recurrence. -Patient requested second opinion of surgery, was seen at [facility].  Neoadjuvant chemotherapy recommended before surgical resection, and he agreed. -He started chemotherapy FOLFIRI on 10/20/2022, no bevacizumab due to pending surgery  -NGS Tempus showed no targetable therapy, KRAS/NRAS/BRAF wild type, would not recommend EGFR inhibitor for first line due to his primary right side colon cancer. -he completed 3 months chemo in April 2024.  He was referred to liver surgeon Dr. Gwenlyn Perking at [facility], for patient's personal issue, his surgery has been postponed until recently.  Unfortunately his recent CT scan on 04/16/23 at [facility] showed possible bone metastasis at right Acetabulum, his liver resection was canceled.   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  He is at high risk for fracture. -I recommend restarting chemotherapy CAPOX and beva.  He started on 05/25/23.

## 2023-06-15 NOTE — Progress Notes (Signed)
Nutrition Follow-up:  Patient with stage III cancer of right colon, with mets to liver.  Surgery at [facility] cancelled due to further progression to bones.  Receiving CapeOx and bevacizumab.    Met with patient during infusion.  Noted nausea and vomiting after last treatment.  Md adding dexamethasone and compazine today.  Yesterday able to eat 4 tuna sandwiches, chips and 2 waffles. Drinks boost at times.   Medications: dexamethasone and compazine  Labs: reviewed  Anthropometrics:   Weight 169 lb 8 oz today 172 lb 9/23 190 lb 3.2 oz on 4/29 186 lb 12.8 oz on 4/2  11% weight loss in the last 6 months, concerning   NUTRITION DIAGNOSIS: Inadequate oral intake continues   INTERVENTION:  Patient agreeable to team submitting information to [employer] for possible Medicaid coverage of boost shakes Recommend boost plus TID for added calories and protein.  Weight continues to decline.  Reviewed foods to choose with nausea and vomiting. Handout provided    MONITORING, EVALUATION, GOAL: weight trends, intake   NEXT VISIT: Monday, Nov 4 during infusion  Nahsir Venezia B. Freida Busman, RD, LDN Registered Dietitian [phone]

## 2023-06-18 ENCOUNTER — Ambulatory Visit (HOSPITAL_COMMUNITY): Payer: Medicaid Other

## 2023-06-19 ENCOUNTER — Other Ambulatory Visit (HOSPITAL_COMMUNITY): Payer: Self-pay

## 2023-06-22 ENCOUNTER — Other Ambulatory Visit: Payer: Self-pay

## 2023-06-24 ENCOUNTER — Other Ambulatory Visit: Payer: Self-pay

## 2023-06-25 ENCOUNTER — Other Ambulatory Visit: Payer: Self-pay

## 2023-06-29 ENCOUNTER — Ambulatory Visit (HOSPITAL_COMMUNITY)
Admission: RE | Admit: 2023-06-29 | Discharge: 2023-06-29 | Disposition: A | Discharge status: Home / Self Care | Payer: Medicaid Other | Source: Ambulatory Visit | Attending: Hematology | Admitting: Hematology

## 2023-06-29 DIAGNOSIS — C7951 Secondary malignant neoplasm of bone: Secondary | ICD-10-CM | POA: Diagnosis not present

## 2023-06-29 DIAGNOSIS — C182 Malignant neoplasm of ascending colon: Secondary | ICD-10-CM | POA: Insufficient documentation

## 2023-06-29 DIAGNOSIS — C787 Secondary malignant neoplasm of liver and intrahepatic bile duct: Secondary | ICD-10-CM | POA: Diagnosis not present

## 2023-06-29 LAB — GLUCOSE, CAPILLARY: Glucose-Capillary: 96 mg/dL (ref 70–99)

## 2023-06-29 MED ORDER — FLUDEOXYGLUCOSE F - 18 (FDG) INJECTION
8.5000 | Freq: Once | INTRAVENOUS | Status: AC | PRN
  Administered 2023-06-29: 8.45 via INTRAVENOUS

## 2023-07-03 ENCOUNTER — Telehealth: Payer: Self-pay

## 2023-07-03 MED FILL — Fosaprepitant Dimeglumine For IV Infusion 150 MG (Base Eq): INTRAVENOUS | Qty: 5 | Status: AC

## 2023-07-03 NOTE — Telephone Encounter (Signed)
Pt called stating his grandmother died and would like to reschedule his appts for the week of 2023/07/31 d/t grandmother's death.  Gave condolences and stated this nurse will make Dr. Mosetta Putt aware and Dr. Latanya Maudlin scheduler will contact the pt to get him rescheduled for the appts.

## 2023-07-06 ENCOUNTER — Inpatient Hospital Stay: Payer: Medicaid Other | Admitting: Hematology

## 2023-07-06 ENCOUNTER — Inpatient Hospital Stay: Payer: Medicaid Other

## 2023-07-06 ENCOUNTER — Telehealth: Payer: Self-pay | Admitting: Hematology

## 2023-07-06 DIAGNOSIS — M5416 Radiculopathy, lumbar region: Secondary | ICD-10-CM | POA: Diagnosis not present

## 2023-07-06 DIAGNOSIS — C189 Malignant neoplasm of colon, unspecified: Secondary | ICD-10-CM | POA: Diagnosis not present

## 2023-07-06 DIAGNOSIS — G8929 Other chronic pain: Secondary | ICD-10-CM | POA: Diagnosis not present

## 2023-07-06 DIAGNOSIS — C229 Malignant neoplasm of liver, not specified as primary or secondary: Secondary | ICD-10-CM | POA: Diagnosis not present

## 2023-07-06 DIAGNOSIS — M542 Cervicalgia: Secondary | ICD-10-CM | POA: Diagnosis not present

## 2023-07-07 ENCOUNTER — Other Ambulatory Visit: Payer: Self-pay

## 2023-07-08 ENCOUNTER — Encounter: Payer: Self-pay | Admitting: General Practice

## 2023-07-08 NOTE — Progress Notes (Signed)
[facility] Spiritual Care Note  Referred by Scheduling for bereavement support due to the recent loss of Mr Bensen's grandmother. Voicemail has not been set up, so plan to keep trying.   [address] [place], [place], [facility] Pager [phone] Voicemail [phone]

## 2023-07-09 ENCOUNTER — Encounter: Payer: Self-pay | Admitting: General Practice

## 2023-07-09 NOTE — Progress Notes (Signed)
[facility] Spiritual Care Note  Reached Mr Cinquemani by phone to introduce Spiritual Care as part of his support team. We plan to meet in person at his infusion on 11/25, and he knows to call in the meantime as needed/desired.   [address] [place], [place], [facility] Pager [phone] Voicemail [phone]

## 2023-07-24 ENCOUNTER — Other Ambulatory Visit: Payer: Self-pay

## 2023-07-24 DIAGNOSIS — C182 Malignant neoplasm of ascending colon: Secondary | ICD-10-CM

## 2023-07-24 MED ORDER — PROCHLORPERAZINE MALEATE 10 MG PO TABS: 10.0000 mg | ORAL_TABLET | Freq: Four times a day (QID) | ORAL | 2 refills | Status: DC | PRN

## 2023-07-24 MED ORDER — ONDANSETRON HCL 8 MG PO TABS: 8.0000 mg | ORAL_TABLET | Freq: Three times a day (TID) | ORAL | 2 refills | Status: DC | PRN

## 2023-07-24 MED ORDER — DEXAMETHASONE 4 MG PO TABS: 8.0000 mg | ORAL_TABLET | Freq: Every day | ORAL | 1 refills | Status: DC

## 2023-07-24 MED ORDER — LIDOCAINE-PRILOCAINE 2.5-2.5 % EX CREA: TOPICAL_CREAM | CUTANEOUS | 3 refills | Status: DC

## 2023-07-24 MED FILL — Fosaprepitant Dimeglumine For IV Infusion 150 MG (Base Eq): INTRAVENOUS | Qty: 5 | Status: AC

## 2023-07-24 NOTE — Progress Notes (Signed)
Pt called requesting if refills could be sent to his preferred pharmacy for EMLA, Zofran, Compazine, and Dexamethasone.  Refill prescription sent.

## 2023-07-25 NOTE — Assessment & Plan Note (Signed)
Stage IIIC (pT4a, pN2b, cM0), MSS, KRAS/NRAS/BRAF wild type -diagnosed in 07/2021 -s/p partial colectomy by Dr. Derrell Lolling 07/23/21, final path showed: pT4a lesion with LVI and PNI, no perforation; clear margins; 9/19 positive LNs, pN2b --He received Capox 09/11/21 - 01/15/22, oxali discontinued due to significant nausea/vomiting and headaches. He completed Xeloda in late June 2023.  -He unfortunately developed oligo liver metastasis based on PET/CT in 08/2022, liver biopsy was attempted but did not feasible.  -Guardian review was positive for circulating tumor DNA, consistent with recurrence. -Patient requested second opinion of surgery, was seen at [facility].  Neoadjuvant chemotherapy recommended before surgical resection, and he agreed. -He started chemotherapy FOLFIRI on 10/20/2022, no bevacizumab due to pending surgery  -NGS Tempus showed no targetable therapy, KRAS/NRAS/BRAF wild type, would not recommend EGFR inhibitor for first line due to his primary right side colon cancer. -he completed 3 months chemo in April 2024.  He was referred to liver surgeon Dr. Gwenlyn Perking at [facility], for patient's personal issue, his surgery has been postponed until recently.  Unfortunately his recent CT scan on 04/16/23 at [facility] showed possible bone metastasis at right Acetabulum, his liver resection was canceled.   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  He is at high risk for fracture. -I recommend restarting chemotherapy CAPOX and beva.  He started on 05/25/23. Cycle 3 was cancelled due to a death in family

## 2023-07-27 ENCOUNTER — Encounter: Payer: Self-pay | Admitting: General Practice

## 2023-07-27 ENCOUNTER — Inpatient Hospital Stay: Payer: Medicaid Other | Attending: Nurse Practitioner

## 2023-07-27 ENCOUNTER — Inpatient Hospital Stay (HOSPITAL_BASED_OUTPATIENT_CLINIC_OR_DEPARTMENT_OTHER): Payer: Medicaid Other | Admitting: Hematology

## 2023-07-27 ENCOUNTER — Inpatient Hospital Stay: Payer: Medicaid Other

## 2023-07-27 VITALS — BP 130/96 | HR 90 | Temp 98.1°F | Resp 18 | Ht 69.0 in | Wt 162.2 lb

## 2023-07-27 DIAGNOSIS — I10 Essential (primary) hypertension: Secondary | ICD-10-CM | POA: Diagnosis not present

## 2023-07-27 DIAGNOSIS — Z7952 Long term (current) use of systemic steroids: Secondary | ICD-10-CM | POA: Insufficient documentation

## 2023-07-27 DIAGNOSIS — C182 Malignant neoplasm of ascending colon: Secondary | ICD-10-CM

## 2023-07-27 DIAGNOSIS — K219 Gastro-esophageal reflux disease without esophagitis: Secondary | ICD-10-CM | POA: Insufficient documentation

## 2023-07-27 DIAGNOSIS — K6389 Other specified diseases of intestine: Secondary | ICD-10-CM | POA: Insufficient documentation

## 2023-07-27 DIAGNOSIS — C787 Secondary malignant neoplasm of liver and intrahepatic bile duct: Secondary | ICD-10-CM | POA: Insufficient documentation

## 2023-07-27 DIAGNOSIS — Z5111 Encounter for antineoplastic chemotherapy: Secondary | ICD-10-CM | POA: Insufficient documentation

## 2023-07-27 DIAGNOSIS — G629 Polyneuropathy, unspecified: Secondary | ICD-10-CM | POA: Insufficient documentation

## 2023-07-27 DIAGNOSIS — F1010 Alcohol abuse, uncomplicated: Secondary | ICD-10-CM | POA: Insufficient documentation

## 2023-07-27 DIAGNOSIS — Z79899 Other long term (current) drug therapy: Secondary | ICD-10-CM | POA: Insufficient documentation

## 2023-07-27 LAB — CBC WITH DIFFERENTIAL (CANCER CENTER ONLY)
Abs Immature Granulocytes: 0.03 10*3/uL (ref 0.00–0.07)
Basophils Absolute: 0 10*3/uL (ref 0.0–0.1)
Basophils Relative: 1 %
Eosinophils Absolute: 0.2 10*3/uL (ref 0.0–0.5)
Eosinophils Relative: 2 %
HCT: 35.2 % — ABNORMAL LOW (ref 39.0–52.0)
Hemoglobin: 11.5 g/dL — ABNORMAL LOW (ref 13.0–17.0)
Immature Granulocytes: 0 %
Lymphocytes Relative: 30 %
Lymphs Abs: 2.1 10*3/uL (ref 0.7–4.0)
MCH: 28.2 pg (ref 26.0–34.0)
MCHC: 32.7 g/dL (ref 30.0–36.0)
MCV: 86.3 fL (ref 80.0–100.0)
Monocytes Absolute: 0.3 10*3/uL (ref 0.1–1.0)
Monocytes Relative: 4 %
Neutro Abs: 4.4 10*3/uL (ref 1.7–7.7)
Neutrophils Relative %: 63 %
Platelet Count: 426 10*3/uL — ABNORMAL HIGH (ref 150–400)
RBC: 4.08 MIL/uL — ABNORMAL LOW (ref 4.22–5.81)
RDW: 15.5 % (ref 11.5–15.5)
WBC Count: 7 10*3/uL (ref 4.0–10.5)
nRBC: 0 % (ref 0.0–0.2)

## 2023-07-27 LAB — CMP (CANCER CENTER ONLY)
ALT: 7 U/L (ref 0–44)
AST: 22 U/L (ref 15–41)
Albumin: 4.1 g/dL (ref 3.5–5.0)
Alkaline Phosphatase: 230 U/L — ABNORMAL HIGH (ref 38–126)
Anion gap: 6 (ref 5–15)
BUN: 15 mg/dL (ref 6–20)
CO2: 29 mmol/L (ref 22–32)
Calcium: 9.5 mg/dL (ref 8.9–10.3)
Chloride: 105 mmol/L (ref 98–111)
Creatinine: 0.7 mg/dL (ref 0.61–1.24)
GFR, Estimated: >60 mL/min (ref >60)
Glucose, Bld: 105 mg/dL — ABNORMAL HIGH (ref 70–99)
Potassium: 3.8 mmol/L (ref 3.5–5.1)
Sodium: 140 mmol/L (ref 135–145)
Total Bilirubin: 0.3 mg/dL (ref <1.2)
Total Protein: 9.3 g/dL — ABNORMAL HIGH (ref 6.5–8.1)

## 2023-07-27 LAB — TOTAL PROTEIN, URINE DIPSTICK: Protein, ur: NEGATIVE mg/dL

## 2023-07-27 MED ORDER — SODIUM CHLORIDE 0.9 % IV SOLN
7.5000 mg/kg | Freq: Once | INTRAVENOUS | Status: AC
  Administered 2023-07-27: 600 mg via INTRAVENOUS
  Filled 2023-07-27: qty 8

## 2023-07-27 MED ORDER — DIPHENHYDRAMINE HCL 50 MG/ML IJ SOLN
25.0000 mg | Freq: Once | INTRAMUSCULAR | Status: AC
  Administered 2023-07-27: 25 mg via INTRAVENOUS
  Filled 2023-07-27: qty 1

## 2023-07-27 MED ORDER — DEXAMETHASONE SODIUM PHOSPHATE 10 MG/ML IJ SOLN
10.0000 mg | Freq: Once | INTRAMUSCULAR | Status: AC
  Administered 2023-07-27: 10 mg via INTRAVENOUS
  Filled 2023-07-27: qty 1

## 2023-07-27 MED ORDER — PALONOSETRON HCL INJECTION 0.25 MG/5ML
0.2500 mg | Freq: Once | INTRAVENOUS | Status: AC
  Administered 2023-07-27: 0.25 mg via INTRAVENOUS
  Filled 2023-07-27: qty 5

## 2023-07-27 MED ORDER — OXALIPLATIN CHEMO INJECTION 100 MG/20ML
175.0000 mg | Freq: Once | INTRAVENOUS | Status: AC
  Administered 2023-07-27: 175 mg via INTRAVENOUS
  Filled 2023-07-27: qty 35

## 2023-07-27 MED ORDER — SODIUM CHLORIDE 0.9 % IV SOLN
150.0000 mg | Freq: Once | INTRAVENOUS | Status: AC
  Administered 2023-07-27: 150 mg via INTRAVENOUS
  Filled 2023-07-27: qty 150

## 2023-07-27 MED ORDER — FAMOTIDINE IN NACL 20-0.9 MG/50ML-% IV SOLN
20.0000 mg | Freq: Once | INTRAVENOUS | Status: AC
  Administered 2023-07-27: 20 mg via INTRAVENOUS
  Filled 2023-07-27: qty 50

## 2023-07-27 MED ORDER — SODIUM CHLORIDE 0.9% FLUSH
10.0000 mL | INTRAVENOUS | Status: DC | PRN
  Administered 2023-07-27: 10 mL

## 2023-07-27 MED ORDER — HEPARIN SOD (PORK) LOCK FLUSH 100 UNIT/ML IV SOLN
500.0000 [IU] | Freq: Once | INTRAVENOUS | Status: AC | PRN
  Administered 2023-07-27: 500 [IU]

## 2023-07-27 MED ORDER — SODIUM CHLORIDE 0.9 % IV SOLN: Freq: Once | INTRAVENOUS | Status: AC

## 2023-07-27 MED ORDER — DEXTROSE 5 % IV SOLN: Freq: Once | INTRAVENOUS | Status: AC

## 2023-07-27 NOTE — Progress Notes (Signed)
Continue Oxaliplatin 175mg  per MD.  Richardean Sale, RPH, BCPS, BCOP 07/27/2023 11:17 AM

## 2023-07-27 NOTE — Progress Notes (Signed)
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 Derrell Lolling 07/23/21, final path showed: pT4a lesion with LVI and PNI, no perforation; clear margins; 9/19 positive LNs, pN2b --He received Capox 09/11/21 - 01/15/22, oxali discontinued due to significant nausea/vomiting and headaches. He completed Xeloda in late June 2023.  -He unfortunately developed oligo liver metastasis based on PET/CT in 08/2022, liver biopsy was attempted but did not feasible.  -Guardian review was positive for circulating tumor DNA, consistent with recurrence. -Patient requested second opinion of surgery, was seen at [facility].  Neoadjuvant chemotherapy recommended before surgical resection, and he agreed. -He started chemotherapy FOLFIRI on 10/20/2022, no bevacizumab due to pending surgery  -NGS Tempus showed no targetable therapy, KRAS/NRAS/BRAF wild type, would not recommend EGFR inhibitor for first line due to his primary right side colon cancer. -he completed 3 months chemo in April 2024.  He was referred to liver surgeon Dr. Gwenlyn Perking at [facility], for patient's personal issue, his surgery has been postponed until recently.  Unfortunately his recent CT scan on 04/16/23 at [facility] showed possible bone metastasis at right Acetabulum, his liver resection was canceled.   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  He is at high risk for fracture. -I recommend restarting chemotherapy CAPOX and beva.  He started on 05/25/23. Cycle 3 was cancelled due to a death in family     Assessment and Plan    Metastatic Colon Cancer 47 year old with metastatic colon cancer, currently undergoing chemotherapy. Missed last session due to family issues. Reports 8-pound weight loss in the last month, likely due to stress and cancer. No new gastrointestinal symptoms. Resolved neuropathy post-chemotherapy. Blood counts stable, hemoglobin slightly improved. Elevated blood pressure likely due to bevacizumab. No current pain, aware of potential bone pain due to metastasis. Discussed high-protein, high-calorie diet for weight loss. Informed about chemotherapy side effects, including cold sensitivity and need to monitor blood pressure due to bevacizumab. Emphasized importance of continuing chemotherapy to manage cancer progression. - Restart chemotherapy with bevacizumab and oxaliplatin - Continue oral chemotherapy capecitabine (3 pills twice a day for 14 days) - Monitor weight and increase intake of high-protein, high-calorie foods - Monitor blood pressure weekly at home and restart carvedilol if needed - Provide urine sample for testing - Schedule next chemotherapy session for August 17, 2023 - Consider pain medication or radiation if bone pain worsens - Follow-up in three weeks  Hypertension Elevated blood pressure likely secondary to bevacizumab. Previously on carvedilol, which was discontinued. Advised to restart carvedilol and monitor blood pressure at home. Instructed to contact primary care physician if blood pressure remains elevated. - Restart carvedilol - Monitor blood pressure weekly at home - Contact primary care physician if blood pressure remains  elevated  General Health Maintenance Advised on dietary modifications to address weight loss.  Emphasized the need for high-protein, high-calorie foods. - Increase intake of high-protein, high-calorie foods - Monitor weight regularly  Follow-up - Next chemotherapy session on August 17, 2023 - Follow-up visit in three weeks.      Plan -Lab reviewed, CBC is adequate for treatment, CMP still pending, will proceed chemotherapy as scheduled -He will continue Xeloda at home for 2 weeks -Next follow-up in 2 weeks before next cycle chemo -PET scan reviewed with patient.   SUMMARY OF ONCOLOGIC HISTORY: Oncology History Overview Note   Cancer Staging  Cancer of right colon [facility]) Staging form: Colon and Rectum, AJCC 8th Edition - Pathologic stage from 07/23/2021: Stage IIIC (pT4a, pN2b, cM0) - Signed by Pollyann Samples, NP on 08/15/2021 Stage prefix: Initial diagnosis Histologic grading system: 4 grade system Histologic grade (G): G2 Laterality: Right Lymph-vascular invasion (LVI): LVI present/identified, NOS Perineural invasion (PNI): Present Microsatellite instability (MSI): Stable     Cancer of right colon (HCC)  07/19/2021 Imaging   CT AP IMPRESSION: Focal area of high-grade narrowing of the proximal ascending colon may be sequela of chronic inflammation and stricture versus a mass. There is mild distension of the cecum and terminal ileum. Clinical correlation and further evaluation with lower GI study or colonoscopy recommended.   07/22/2021 Procedure   Colonoscopy A severe stenosis was found in the distal ascending colon and was non-traversed. It was ulcerated and inflamed. This could be inflammatory versus malignant stricture.   07/22/2021 Initial Biopsy   FINAL MICROSCOPIC DIAGNOSIS:  A. COLON, ASCENDING, BIOPSY:  - Invasive colorectal adenocarcinoma, moderately differentiated.    07/23/2021 Definitive Surgery   PRE-OPERATIVE DIAGNOSIS:  right colon  mass POST-OPERATIVE DIAGNOSIS:  right colon mass PROCEDURE:  Procedure(s): XI ROBOT ASSISTED LAPAROSCOPIC RIGHT COLECTOMY (N/A) Dr. Derrell Lolling   07/23/2021 Pathology Results   FINAL MICROSCOPIC DIAGNOSIS:  A. COLON, RIGHT, RESECTION:  - Invasive moderately differentiated colonic adenocarcinoma, 4.5 cm.  - Carcinoma extends into pericolonic connective tissue and focally  involves serosal surface.  - Margins not involved.  - Metastatic carcinoma in nine of nineteen lymph nodes (9/19).  - Unremarkable appendix. MMR normal, MSI-stable    07/23/2021 Cancer Staging   Staging form: Colon and Rectum, AJCC 8th Edition - Pathologic stage from 07/23/2021: Stage IIIC (pT4a, pN2b, cM0) - Signed by Pollyann Samples, NP on 08/15/2021 Stage prefix: Initial diagnosis Histologic grading system: 4 grade system Histologic grade (G): G2 Laterality: Right Lymph-vascular invasion (LVI): LVI present/identified, NOS Perineural invasion (PNI): Present Microsatellite instability (MSI): Stable   07/24/2021 Initial Diagnosis   Colon adenocarcinoma (HCC)   07/25/2021 Tumor Marker   CEA: 2.9   07/28/2021 Imaging   CT chest IMPRESSION: 1. No evidence of metastatic disease within the chest. 2. Lungs demonstrate atelectasis, most evident lower lobes and bases of the left upper lobe lingula and right middle lobe. No convincing pneumonia and no evidence of pulmonary edema.     09/11/2021 - 01/15/2022 Chemotherapy   Patient is on Treatment Plan : COLORECTAL Xelox (Capeox) q21d     08/2022 Progression   PET scan showed hypermetabolic oligo liver metastasis, biopsy attempted but liver lesion was not seen by [place]. GuardantReveal was also positive for circulating tumor DNA    09/02/2022 - 09/02/2022 Chemotherapy   Patient is on Treatment Plan : COLORECTAL FOLFIRI + Bevacizumab q14d      Genetic Testing   Negative genetic testing. No pathogenic variants identified on the [facility] Multi-Cancer+RNA panel. VUS in APC  called [id number] identified. The report date is 09/15/2022.  The Multi-Cancer + RNA Panel offered by [facility] includes sequencing and/or deletion/duplication analysis of the following 70 genes:  AIP*, ALK, APC*, ATM*, AXIN2*, BAP1*, BARD1*, BLM*, BMPR1A*, BRCA1*, BRCA2*, BRIP1*, CDC73*, CDH1*, CDK4, CDKN1B*, CDKN2A, CHEK2*, CTNNA1*, DICER1*, EPCAM, EGFR, FH*, FLCN*, GREM1, HOXB13, KIT, LZTR1, MAX*, MBD4, MEN1*, MET, MITF, MLH1*, MSH2*, MSH3*, MSH6*, MUTYH*, NF1*, NF2*, NTHL1*, PALB2*, PDGFRA, PMS2*, POLD1*, POLE*, POT1*, PRKAR1A*, PTCH1*, PTEN*, RAD51C*, RAD51D*, RB1*, RET, SDHA*, SDHAF2*, SDHB*, SDHC*, SDHD*, SMAD4*, SMARCA4*, SMARCB1*, SMARCE1*, STK11*, SUFU*, TMEM127*, TP53*, TSC1*, TSC2*, VHL*. RNA analysis is performed for * genes.   10/20/2022 - 12/31/2022 Chemotherapy   Patient is on Treatment Plan : COLORECTAL FOLFIRI q14d     05/25/2023 -  Chemotherapy   Patient is on Treatment Plan : COLORECTAL CapeOx + Bevacizumab q21d        Discussed the use of AI scribe software for clinical note transcription with the patient, who gave verbal consent to proceed.  History of Present Illness   The patient, a 47 year old with metastatic colon cancer, presents for a follow-up visit after a month-long break from chemotherapy due to a family bereavement. He reports no new symptoms or stomach issues, and his bowel movements are regular. However, he has lost eight pounds in the past month, which he attributes to stress.  The patient is on a regimen of chemotherapy pills, which he takes twice a day for 14 days, and he also has nausea medication and steroids at home. He reports no skin problems from the oral chemotherapy. He experienced some neuropathy after the last round of chemotherapy, but this has since resolved.  The patient also mentions occasional pain in the area where he knows there is cancer in his bones.         All other systems were reviewed with the patient and are negative.  MEDICAL  HISTORY:  Past Medical History:  Diagnosis Date   Alcohol abuse    Cancer (HCC)    GERD (gastroesophageal reflux disease)    Seizure (HCC)     SURGICAL HISTORY: Past Surgical History:  Procedure Laterality Date   BIOPSY  07/22/2021   Procedure: BIOPSY;  Surgeon: Kathi Der, MD;  Location: [facility];  Service: Gastroenterology;;   COLON SURGERY     COLONOSCOPY WITH PROPOFOL N/A 07/22/2021   Procedure: COLONOSCOPY WITH PROPOFOL;  Surgeon: Kathi Der, MD;  Location: [facility];  Service: Gastroenterology;  Laterality: N/A;   IR IMAGING GUIDED PORT INSERTION  10/17/2022   NO PAST SURGERIES     SUBMUCOSAL TATTOO INJECTION  07/22/2021   Procedure: SUBMUCOSAL TATTOO INJECTION;  Surgeon: Kathi Der, MD;  Location: [facility];  Service: Gastroenterology;;    I have reviewed the social history and family history with the patient and they are unchanged from previous note.  ALLERGIES:  is allergic to latex and morphine and codeine.  MEDICATIONS:  Current Outpatient Medications  Medication Sig Dispense Refill   capecitabine (XELODA) 500 MG tablet Take 3 tablets (1,500 mg total) by mouth 2 (two) times daily after a meal. Take for 14 days on, then 7 days off. Repeat every 21 days. Start with IV chemo 84 tablet 1   carvedilol (COREG) 25 MG tablet Take 1 tablet (25 mg total) by mouth 2 (two) times daily with a meal. 30 tablet 0   cyclobenzaprine (FLEXERIL) 10 MG tablet Take 10-20 mg by mouth 2 (two) times daily as needed for muscle spasms.     dexamethasone (DECADRON) 4 MG tablet Take 1 tablet (4 mg total)  by mouth daily. Take for 3-5 days after chemo for nausea and fatigue 20 tablet 1   dexamethasone (DECADRON) 4 MG tablet Take 2 tablets (8 mg total) by mouth daily. Start the day after chemotherapy for 3-5 days. Take with food. 30 tablet 1   diphenoxylate-atropine (LOMOTIL) 2.5-0.025 MG tablet Take 1 tablet by mouth 4 (four) times daily as needed for diarrhea or loose  stools. (Patient not taking: Reported on 01/07/2023) 30 tablet 0   escitalopram (LEXAPRO) 10 MG tablet Take 10 mg by mouth daily.     ferrous sulfate 325 (65 FE) MG tablet Take 1 tablet (325 mg total) by mouth 2 (two) times daily with a meal. (Patient not taking: Reported on 01/07/2023) 100 tablet 3   gabapentin (NEURONTIN) 300 MG capsule Take 300 mg by mouth 3 (three) times daily.     HYDROcodone-acetaminophen (NORCO/VICODIN) 5-325 MG tablet Take 1 tablet by mouth every 6 (six) hours as needed for moderate pain.     lidocaine-prilocaine (EMLA) cream Apply to affected area once 30 g 3   magic mouthwash (nystatin, lidocaine, diphenhydrAMINE, alum & mag hydroxide) suspension Take 5 mLs by mouth 3 (three) times daily as needed for mouth pain. (Patient not taking: Reported on 01/07/2023) 140 mL 1   ondansetron (ZOFRAN) 8 MG tablet Take 1 tablet (8 mg total) by mouth every 8 (eight) hours as needed for nausea or vomiting. Start on the third day after chemotherapy. 30 tablet 2   oxyCODONE (ROXICODONE) 5 MG immediate release tablet Take 1 tablet (5 mg total) by mouth every 6 (six) hours as needed for severe pain. (Patient not taking: Reported on 01/07/2023) 20 tablet 0   Oxycodone HCl 10 MG TABS Take 10 mg by mouth every 6 (six) hours as needed.     polyethylene glycol (MIRALAX / GLYCOLAX) 17 g packet Take 17 g by mouth daily as needed for mild constipation or moderate constipation. (Patient not taking: Reported on 01/07/2023) 14 each 0   potassium chloride SA (KLOR-CON M) 20 MEQ tablet Take 1 tablet (20 mEq total) by mouth daily for 5 days. 30 tablet 0   prochlorperazine (COMPAZINE) 10 MG tablet Take 1 tablet (10 mg total) by mouth every 6 (six) hours as needed for nausea or vomiting. 30 tablet 2   promethazine (PHENERGAN) 25 MG tablet Take 1 tablet (25 mg total) by mouth every 6 (six) hours as needed for nausea or vomiting. 30 tablet 1   No current facility-administered medications for this visit.    PHYSICAL  EXAMINATION: ECOG PERFORMANCE STATUS: 1 - Symptomatic but completely ambulatory  Vitals:   07/27/23 0855 07/27/23 0856  BP: (!) 144/99 (!) 130/96  Pulse: 90   Resp: 18   Temp: 98.1 F (36.7 C)   SpO2: 100%    Wt Readings from Last 3 Encounters:  07/27/23 162 lb 3 oz (73.6 kg)  06/15/23 169 lb 8 oz (76.9 kg)  05/25/23 172 lb (78 kg)     GENERAL:alert, no distress and comfortable SKIN: skin color, texture, turgor are normal, no rashes or significant lesions EYES: normal, Conjunctiva are pink and non-injected, sclera clear NECK: supple, thyroid normal size, non-tender, without nodularity LYMPH:  no palpable lymphadenopathy in the cervical, axillary  LUNGS: clear to auscultation and percussion with normal breathing effort HEART: regular rate & rhythm and no murmurs and no lower extremity edema ABDOMEN:abdomen soft, non-tender and normal bowel sounds Musculoskeletal:no cyanosis of digits and no clubbing  NEURO: alert & oriented x 3 with  fluent speech, no focal motor/sensory deficits   LABORATORY DATA:  I have reviewed the data as listed    Latest Ref Rng & Units 07/27/2023    8:35 AM 06/15/2023    8:34 AM 05/25/2023    8:57 AM  CBC  WBC 4.0 - 10.5 K/uL 7.0  5.8  5.6   Hemoglobin 13.0 - 17.0 g/dL 00.9  38.1  82.9   Hematocrit 39.0 - 52.0 % 35.2  35.2  36.1   Platelets 150 - 400 K/uL 426  380  321         Latest Ref Rng & Units 06/15/2023    8:34 AM 05/25/2023    8:57 AM 04/22/2023    1:57 PM  CMP  Glucose 70 - 99 mg/dL 937  169  97   BUN 6 - 20 mg/dL 10  11  11    Creatinine 0.61 - 1.24 mg/dL 6.78  9.38  1.01   Sodium 135 - 145 mmol/L 137  138  138   Potassium 3.5 - 5.1 mmol/L 3.8  3.8  3.9   Chloride 98 - 111 mmol/L 101  103  101   CO2 22 - 32 mmol/L 29  29  28    Calcium 8.9 - 10.3 mg/dL 9.5  9.5  9.7   Total Protein 6.5 - 8.1 g/dL 8.5  8.2  8.8   Total Bilirubin 0.3 - 1.2 mg/dL 0.4  0.4  0.4   Alkaline Phos 38 - 126 U/L 239  204  174   AST 15 - 41 U/L 16  18  19     ALT 0 - 44 U/L 8  7  10        RADIOGRAPHIC STUDIES: I have personally reviewed the radiological images as listed and agreed with the findings in the report. No results found.    Orders Placed This Encounter  Procedures   CBC with Differential (Cancer Center Only)    Standing Status:   Future    Standing Expiration Date:   09/06/2024   CMP (Cancer Center only)    Standing Status:   Future    Standing Expiration Date:   09/06/2024   Total Protein, Urine dipstick    Standing Status:   Future    Standing Expiration Date:   09/06/2024   All questions were answered. The patient knows to call the clinic with any problems, questions or concerns. No barriers to learning was detected. The total time spent in the appointment was 25 minutes.     Malachy Mood, MD 07/27/2023

## 2023-07-27 NOTE — Progress Notes (Signed)
Nutrition Follow-up:  Patient with stage III cancer of right colon cancer with mets to liver.  Surgery at [facility] cancelled due to further progression to bones.  Patient on capecitabine, oxaliplatin, vebacizumab-adcd (vegzeloma).    Met with patient during infusion. Reports that he eats only 1 meal a day.  Has not heard anything from [employer] about boost shakes.  Denies stomach upset and regular bowel movements.     Medications: reviewed  Labs: reviewed  Anthropometrics:   Weight 162 lb 3 oz today  169 lb 8 oz on 10/14 172 lb on 9/23 190 lb on 4/29 186 lb on 4/2  6% weight loss in the last 2 months, concerning   NUTRITION DIAGNOSIS: Inadequate oral intake continues   INTERVENTION:  Will follow up with [employer] regarding [place] Medicaid coverage for oral nutrition supplements Continue shakes Encouraged small frequent meals ([place] sandwich, yogurt, peanut butter crackers, etc).   May benefit from trial of appetite stimulant.      MONITORING, EVALUATION, GOAL: weight trends, intake   NEXT VISIT: Monday, Dec 16 during infusion  Arlene Brickel B. Freida Busman, RD, LDN Registered Dietitian [phone]

## 2023-07-27 NOTE — Patient Instructions (Signed)
[facility] - A DEPT OF [facility]  Discharge Instructions: Thank you for choosing [facility] to provide your oncology and hematology care.   If you have a lab appointment with the Cancer Center, please go directly to the Cancer Center and check in at the registration area.   Wear comfortable clothing and clothing appropriate for easy access to any Portacath or PICC line.   We strive to give you quality time with your provider. You may need to reschedule your appointment if you arrive late (15 or more minutes).  Arriving late affects you and other patients whose appointments are after yours.  Also, if you miss three or more appointments without notifying the office, you may be dismissed from the clinic at the provider's discretion.      For prescription refill requests, have your pharmacy contact our office and allow 72 hours for refills to be completed.    Today you received the following chemotherapy and/or immunotherapy agents: Bevacizumab, Oxaliplatin.       To help prevent nausea and vomiting after your treatment, we encourage you to take your nausea medication as directed.  BELOW ARE SYMPTOMS THAT SHOULD BE REPORTED IMMEDIATELY: *FEVER GREATER THAN 100.4 F (38 C) OR HIGHER *CHILLS OR SWEATING *NAUSEA AND VOMITING THAT IS NOT CONTROLLED WITH YOUR NAUSEA MEDICATION *UNUSUAL SHORTNESS OF BREATH *UNUSUAL BRUISING OR BLEEDING *URINARY PROBLEMS (pain or burning when urinating, or frequent urination) *BOWEL PROBLEMS (unusual diarrhea, constipation, pain near the anus) TENDERNESS IN MOUTH AND THROAT WITH OR WITHOUT PRESENCE OF ULCERS (sore throat, sores in mouth, or a toothache) UNUSUAL RASH, SWELLING OR PAIN  UNUSUAL VAGINAL DISCHARGE OR ITCHING   Items with * indicate a potential emergency and should be followed up as soon as possible or go to the Emergency Department if any problems should occur.  Please show the CHEMOTHERAPY ALERT CARD  or IMMUNOTHERAPY ALERT CARD at check-in to the Emergency Department and triage nurse.  Should you have questions after your visit or need to cancel or reschedule your appointment, please contact [facility] - A DEPT OF [facility]  Dept: [phone]  and follow the prompts.  Office hours are 8:00 a.m. to 4:30 p.m. Monday - Friday. Please note that voicemails left after 4:00 p.m. may not be returned until the following business day.  We are closed weekends and major holidays. You have access to a nurse at all times for urgent questions. Please call the main number to the clinic Dept: [phone] and follow the prompts.   For any non-urgent questions, you may also contact your provider using MyChart. We now offer e-Visits for anyone 39 and older to request care online for non-urgent symptoms. For details visit [email or web address].   Also download the MyChart app! Go to the app store, search "MyChart", open the app, select , and log in with your MyChart username and password.

## 2023-07-27 NOTE — Progress Notes (Signed)
[facility] Spiritual Care Note  Visited with Mr Witteman briefly twice in infusion today, first to meet in person after connecting by phone and later just to offer another opportunity to speak if it would be helpful.  Brought a handmade blessing blanket as a tangible sign of support and comfort, as well as Spiritual Care brochure and information about free [facility] grief counseling, support groups, and similar resources as he is coping with the deaths of his wife and his grandmother while preparing for the holiday season.  Mr Ellenburg anticipates that he will make Thanksgiving plans in the coming days, noting that having people to talk to is helpful, and watches football for enjoyment. Provided empathic listening and normalized doing things that help with meaning, purpose, and connecting with the present moment through the ongoing grieving process.  Plan to follow up by phone next week to offer a more private setting for conversation.   [address] Rush Barer, [place], [facility] Pager [phone] Voicemail [phone]

## 2023-07-27 NOTE — Progress Notes (Signed)
Per Mosetta Putt MD, ok to treat without urine protein today since pt is having trouble producing a sample.

## 2023-07-28 ENCOUNTER — Telehealth: Payer: Self-pay

## 2023-07-28 NOTE — Telephone Encounter (Signed)
Nutrition  Spoke with [employer] regarding [place] Medicaid approval for boost shakes.  [employer] has been trying to reach patient and has been unsuccessful so order was cancelled.  Representative from [employer] told RD to have patient call them so they can reactivate the order.    RD called and left message on patient's voicemail letting him know that he needs to call [employer] at [phone].  Cherrelle Plante B. Freida Busman, RD, LDN Registered Dietitian [phone]

## 2023-07-29 ENCOUNTER — Other Ambulatory Visit: Payer: Self-pay

## 2023-07-30 ENCOUNTER — Other Ambulatory Visit: Payer: Self-pay

## 2023-08-04 ENCOUNTER — Encounter: Payer: Self-pay | Admitting: General Practice

## 2023-08-04 NOTE — Progress Notes (Signed)
[facility] Spiritual Care Note  Followed up with Joseph Haney by phone as planned after brief conversations in infusion. We plan to speak by phone tomorrow around 11 per his request.   Caro Hight, MDiv, [facility] Pager [phone] Voicemail [phone]

## 2023-08-05 ENCOUNTER — Telehealth: Payer: Self-pay

## 2023-08-05 ENCOUNTER — Encounter: Payer: Self-pay | Admitting: General Practice

## 2023-08-05 ENCOUNTER — Other Ambulatory Visit (HOSPITAL_COMMUNITY): Payer: Self-pay

## 2023-08-05 NOTE — Telephone Encounter (Signed)
Tried calling pt to inform pt that [facility] have been trying to contact pt for pickup of pt's Capecitabine.  Unfortunately, this nurse was unsuccessful in reaching pt and pt's voicemail box is not setup.

## 2023-08-05 NOTE — Progress Notes (Signed)
[facility] Spiritual Care Note  Attempted three times to reach Joseph Haney at scheduled time, but no answer or voicemail. Plan to try again tomorrow.   [address] Rush Barer, [place], [facility] Pager [phone] Voicemail [phone]

## 2023-08-06 ENCOUNTER — Encounter: Payer: Self-pay | Admitting: General Practice

## 2023-08-06 NOTE — Progress Notes (Signed)
[facility] Spiritual Care Note  Provided grief support and education by phone, including empathic listening, normalization of feelings, and suggestions for coping with challenges such as living with things unsaid and wondering how to create a legacy of stories of his wife for his grandbaby. In sum, suggested writing letters and using and empty chair for processing, as well as collecting stories, which would make very meaningful and also free gifts. Suan was very appreciative of call and requests follow-up call next week.   [address] Rush Barer, [place], [facility] Pager [phone] Voicemail [phone]

## 2023-08-07 ENCOUNTER — Other Ambulatory Visit: Payer: Self-pay

## 2023-08-11 DIAGNOSIS — G894 Chronic pain syndrome: Secondary | ICD-10-CM | POA: Diagnosis not present

## 2023-08-11 DIAGNOSIS — F1910 Other psychoactive substance abuse, uncomplicated: Secondary | ICD-10-CM | POA: Diagnosis not present

## 2023-08-11 DIAGNOSIS — C189 Malignant neoplasm of colon, unspecified: Secondary | ICD-10-CM | POA: Diagnosis not present

## 2023-08-11 DIAGNOSIS — M542 Cervicalgia: Secondary | ICD-10-CM | POA: Diagnosis not present

## 2023-08-11 DIAGNOSIS — Z79891 Long term (current) use of opiate analgesic: Secondary | ICD-10-CM | POA: Diagnosis not present

## 2023-08-11 DIAGNOSIS — M5416 Radiculopathy, lumbar region: Secondary | ICD-10-CM | POA: Diagnosis not present

## 2023-08-11 DIAGNOSIS — C229 Malignant neoplasm of liver, not specified as primary or secondary: Secondary | ICD-10-CM | POA: Diagnosis not present

## 2023-08-12 ENCOUNTER — Other Ambulatory Visit: Payer: Self-pay | Admitting: Pharmacy Technician

## 2023-08-12 NOTE — Progress Notes (Signed)
Pharmacy unable to make contact with patient and medication has not been filled in over 3 months. Pharmacy will resume following in the future as needed.

## 2023-08-13 ENCOUNTER — Encounter: Payer: Self-pay | Admitting: General Practice

## 2023-08-13 NOTE — Progress Notes (Signed)
[facility] Spiritual Care Note  Reached Joseph Haney by phone at an inconvenient time; he plans to return call.   [address] [place], [place], [facility] Pager [phone] Voicemail [phone]

## 2023-08-14 MED FILL — Fosaprepitant Dimeglumine For IV Infusion 150 MG (Base Eq): INTRAVENOUS | Qty: 5 | Status: AC

## 2023-08-16 NOTE — Assessment & Plan Note (Signed)
 Stage IIIC (pT4a, pN2b, cM0), MSS, KRAS/NRAS/BRAF wild type -diagnosed in 07/2021 -s/p partial colectomy by Dr. Derrell Lolling 07/23/21, final path showed: pT4a lesion with LVI and PNI, no perforation; clear margins; 9/19 positive LNs, pN2b --He received Capox 09/11/21 - 01/15/22, oxali discontinued due to significant nausea/vomiting and headaches. He completed Xeloda in late June 2023.  -He unfortunately developed oligo liver metastasis based on PET/CT in 08/2022, liver biopsy was attempted but did not feasible.  -Guardian review was positive for circulating tumor DNA, consistent with recurrence. -Patient requested second opinion of surgery, was seen at [facility].  Neoadjuvant chemotherapy recommended before surgical resection, and he agreed. -He started chemotherapy FOLFIRI on 10/20/2022, no bevacizumab due to pending surgery  -NGS Tempus showed no targetable therapy, KRAS/NRAS/BRAF wild type, would not recommend EGFR inhibitor for first line due to his primary right side colon cancer. -he completed 3 months chemo in April 2024.  He was referred to liver surgeon Dr. Gwenlyn Perking at [facility], for patient's personal issue, his surgery has been postponed until recently.  Unfortunately his recent CT scan on 04/16/23 at [facility] showed possible bone metastasis at right Acetabulum, his liver resection was canceled.   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  He is at high risk for fracture. -I recommend restarting chemotherapy CAPOX and beva.  He started on 05/25/23. Cycle 3 was cancelled due to a death in family

## 2023-08-17 ENCOUNTER — Telehealth: Payer: Self-pay

## 2023-08-17 ENCOUNTER — Inpatient Hospital Stay: Payer: Medicaid Other | Attending: Nurse Practitioner

## 2023-08-17 ENCOUNTER — Inpatient Hospital Stay: Payer: Medicaid Other | Admitting: Hematology

## 2023-08-17 ENCOUNTER — Inpatient Hospital Stay: Payer: Medicaid Other

## 2023-08-17 ENCOUNTER — Telehealth: Payer: Self-pay | Admitting: Hematology

## 2023-08-17 ENCOUNTER — Inpatient Hospital Stay (HOSPITAL_BASED_OUTPATIENT_CLINIC_OR_DEPARTMENT_OTHER): Payer: Medicaid Other | Admitting: Hematology

## 2023-08-17 DIAGNOSIS — C182 Malignant neoplasm of ascending colon: Secondary | ICD-10-CM

## 2023-08-17 MED ORDER — DEXAMETHASONE 4 MG PO TABS: 4.0000 mg | ORAL_TABLET | Freq: Every day | ORAL | 1 refills | Status: DC

## 2023-08-17 MED ORDER — ACETAMINOPHEN ER 650 MG PO TBCR: 650.0000 mg | EXTENDED_RELEASE_TABLET | Freq: Three times a day (TID) | ORAL | 1 refills | Status: DC | PRN

## 2023-08-17 NOTE — Telephone Encounter (Signed)
Spoke with pt via telephone regarding appts today.  Pt stated he forgot he had appts today.  Pt stated he will need to reschedule since he missed his appts.  Pt also requested a refill on the Dexamethasone Dr. Mosetta Putt prescribed.  Pt also stated the last tx knocked him off his feet.  He said he's also in pain.  Pt stated he's having pain in his bilateral legs and buttocks.  Pt stated he had called in and spoke with someone after his last tx about his symptoms and was told to take Dexamethasone for 2 days and Acetaminophen for the pain.  Pt stated the Dexamethasone worked for a few days but his symptoms returned.  Pt stated the oxycodone works for [employer] but later [employer].  Pt stated he hate he missed his appts today because he really need to speak with Dr. Mosetta Putt before she leaves on vacation.  Stated this nurse will make Dr. Mosetta Putt aware of his request.  Stated this nurse will send a scheduling message to Dr. Latanya Maudlin scheduler to get the pt rescheduled for his chemo.

## 2023-08-18 ENCOUNTER — Encounter: Payer: Self-pay | Admitting: Hematology

## 2023-08-18 ENCOUNTER — Other Ambulatory Visit: Payer: Self-pay

## 2023-08-18 ENCOUNTER — Encounter: Payer: Self-pay | Admitting: Nurse Practitioner

## 2023-08-18 NOTE — Assessment & Plan Note (Signed)
 Stage IIIC (pT4a, pN2b, cM0), MSS, KRAS/NRAS/BRAF wild type -diagnosed in 07/2021 -s/p partial colectomy by Dr. Derrell Lolling 07/23/21, final path showed: pT4a lesion with LVI and PNI, no perforation; clear margins; 9/19 positive LNs, pN2b --He received Capox 09/11/21 - 01/15/22, oxali discontinued due to significant nausea/vomiting and headaches. He completed Xeloda in late June 2023.  -He unfortunately developed oligo liver metastasis based on PET/CT in 08/2022, liver biopsy was attempted but did not feasible.  -Guardian review was positive for circulating tumor DNA, consistent with recurrence. -Patient requested second opinion of surgery, was seen at [facility].  Neoadjuvant chemotherapy recommended before surgical resection, and he agreed. -He started chemotherapy FOLFIRI on 10/20/2022, no bevacizumab due to pending surgery  -NGS Tempus showed no targetable therapy, KRAS/NRAS/BRAF wild type, would not recommend EGFR inhibitor for first line due to his primary right side colon cancer. -he completed 3 months chemo in April 2024.  He was referred to liver surgeon Dr. Gwenlyn Perking at [facility], for patient's personal issue, his surgery has been postponed until recently.  Unfortunately his recent CT scan on 04/16/23 at [facility] showed possible bone metastasis at right Acetabulum, his liver resection was canceled.   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  He is at high risk for fracture. -I recommend restarting chemotherapy CAPOX and beva.  He started on 05/25/23. Cycle 3 was cancelled due to a death in family

## 2023-08-18 NOTE — Progress Notes (Signed)
[facility]   Telephone:(336) [phone] Fax:(336) [phone]   Clinic Follow up Note   Patient Care Team: Frederic Jericho, [name] as PCP - General (Physician Assistant) Axel Filler, MD as Consulting Physician (General Surgery) Pollyann Samples, NP as Nurse Practitioner (Nurse Practitioner) Malachy Mood, MD as Consulting Physician (Hematology) Ewing Schlein, MD as Referring Physician (Pain Medicine) 08/18/2023  I connected with Joseph Haney on 08/18/23 at  3:00 PM EST by telephone and verified that I am speaking with the correct person using two identifiers.   I discussed the limitations, risks, security and privacy concerns of performing an evaluation and management service by telephone and the availability of in person appointments. I also discussed with the patient that there may be a patient responsible charge related to this service. The patient expressed understanding and agreed to proceed.   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 Derrell Haney 07/23/21, final path showed: pT4a lesion with LVI and PNI, no perforation; clear margins; 9/19 positive LNs, pN2b --He received Capox 09/11/21 - 01/15/22, oxali discontinued due to significant nausea/vomiting and headaches. He completed Xeloda in late June 2023.  -He unfortunately developed oligo liver metastasis based on PET/CT in 08/2022, liver biopsy was attempted but did not feasible.  -Guardian review was positive for circulating tumor DNA, consistent with recurrence. -Patient requested second opinion of surgery, was seen at [facility].  Neoadjuvant chemotherapy recommended before surgical resection, and he agreed. -He started chemotherapy  FOLFIRI on 10/20/2022, no bevacizumab due to pending surgery  -NGS Tempus showed no targetable therapy, KRAS/NRAS/BRAF wild type, would not recommend EGFR inhibitor for first line due to his primary right side colon cancer. -he completed 3 months chemo in April 2024.  He was referred to liver surgeon Dr. Gwenlyn Haney at [facility], for patient's personal issue, his surgery has been postponed until recently.  Unfortunately his recent CT scan on 04/16/23 at [facility] showed possible bone metastasis at right Acetabulum, his liver resection was canceled.   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  He is at high risk for fracture. -I recommend restarting chemotherapy CAPOX and beva.  He started on 05/25/23. Cycle 3 was cancelled due to a death in family    Assessment and Plan    Metastatic Colon Cancer 47 year old with metastatic colon cancer, undergoing chemotherapy since May 25, 2023. Missed chemotherapy due to confusion about the date and severe side effects, including significant nausea and inability to eat for a week and a half. Despite dose reduction, severe side effects persist. Patient is willing to continue the most beneficial treatment. CT scan planned for January to assess response. Discussed switching to oral chemotherapy (Xeloda) for better side effect management. Informed about the need for a restaging scan before the next appointment. - Order CT scan for January -Will hold oxaliplatin this cycle, he will proceed with Xeloda - Discuss treatment plan with patient's mother during the next visit  Bone Pain Intermittent tailbone pain likely due to bone metastasis, exacerbated by weight loss and prolonged pressure. Some relief with dexamethasone, but pain returns after stopping. No pressure ulcers reported. Discussed pain management options including Tylenol  650 mg. -  Refill dexamethasone prescription - Prescribe Tylenol 650 mg, TID - Instruct patient to check for pressure ulcers during showers - Consider additional pain management options if current regimen is insufficient  General Health Maintenance Inquired about dietary restrictions, specifically sugar intake. Advised that moderate consumption of sweets is acceptable. - Encourage adequate nutrition and hydration  Plan -Due to his poor tolerance to last cycle chemo, I will hold oxaliplatin for this cycle, he will start oral capecitabine tomorrow - Schedule PET scan before September 08, 2023 - Follow-up appointment on September 08, 2023.         SUMMARY OF ONCOLOGIC HISTORY: Oncology History Overview Note   Cancer Staging  Cancer of right colon [facility]) Staging form: Colon and Rectum, AJCC 8th Edition - Pathologic stage from 07/23/2021: Stage IIIC (pT4a, pN2b, cM0) - Signed by Pollyann Samples, NP on 08/15/2021 Stage prefix: Initial diagnosis Histologic grading system: 4 grade system Histologic grade (G): G2 Laterality: Right Lymph-vascular invasion (LVI): LVI present/identified, NOS Perineural invasion (PNI): Present Microsatellite instability (MSI): Stable     Cancer of right colon (HCC)  07/19/2021 Imaging   CT AP IMPRESSION: Focal area of high-grade narrowing of the proximal ascending colon may be sequela of chronic inflammation and stricture versus a mass. There is mild distension of the cecum and terminal ileum. Clinical correlation and further evaluation with lower GI study or colonoscopy recommended.   07/22/2021 Procedure   Colonoscopy A severe stenosis was found in the distal ascending colon and was non-traversed. It was ulcerated and inflamed. This could be inflammatory versus malignant stricture.   07/22/2021 Initial Biopsy   FINAL MICROSCOPIC DIAGNOSIS:  A. COLON, ASCENDING, BIOPSY:  - Invasive colorectal adenocarcinoma, moderately differentiated.    07/23/2021 Definitive  Surgery   PRE-OPERATIVE DIAGNOSIS:  right colon mass POST-OPERATIVE DIAGNOSIS:  right colon mass PROCEDURE:  Procedure(s): XI ROBOT ASSISTED LAPAROSCOPIC RIGHT COLECTOMY (N/A) Dr. Derrell Haney   07/23/2021 Pathology Results   FINAL MICROSCOPIC DIAGNOSIS:  A. COLON, RIGHT, RESECTION:  - Invasive moderately differentiated colonic adenocarcinoma, 4.5 cm.  - Carcinoma extends into pericolonic connective tissue and focally  involves serosal surface.  - Margins not involved.  - Metastatic carcinoma in nine of nineteen lymph nodes (9/19).  - Unremarkable appendix. MMR normal, MSI-stable    07/23/2021 Cancer Staging   Staging form: Colon and Rectum, AJCC 8th Edition - Pathologic stage from 07/23/2021: Stage IIIC (pT4a, pN2b, cM0) - Signed by Pollyann Samples, NP on 08/15/2021 Stage prefix: Initial diagnosis Histologic grading system: 4 grade system Histologic grade (G): G2 Laterality: Right Lymph-vascular invasion (LVI): LVI present/identified, NOS Perineural invasion (PNI): Present Microsatellite instability (MSI): Stable   07/24/2021 Initial Diagnosis   Colon adenocarcinoma (HCC)   07/25/2021 Tumor Marker   CEA: 2.9   07/28/2021 Imaging   CT chest IMPRESSION: 1. No evidence of metastatic disease within the chest. 2. Lungs demonstrate atelectasis, most evident lower lobes and bases of the left upper lobe lingula and right middle lobe. No convincing pneumonia and no evidence of pulmonary edema.     09/11/2021 - 01/15/2022 Chemotherapy   Patient is on Treatment Plan : COLORECTAL Xelox (Capeox) q21d     08/2022 Progression   PET scan showed hypermetabolic oligo liver metastasis, biopsy attempted but liver lesion was not seen by [place]. GuardantReveal was also positive for circulating tumor DNA    09/02/2022 - 09/02/2022 Chemotherapy   Patient is on Treatment Plan : COLORECTAL FOLFIRI + Bevacizumab q14d      Genetic Testing  Negative genetic testing. No pathogenic variants identified on  the [facility] Multi-Cancer+RNA panel. VUS in APC called [id number] identified. The report date is 09/15/2022.  The Multi-Cancer + RNA Panel offered by [facility] includes sequencing and/or deletion/duplication analysis of the following 70 genes:  AIP*, ALK, APC*, ATM*, AXIN2*, BAP1*, BARD1*, BLM*, BMPR1A*, BRCA1*, BRCA2*, BRIP1*, CDC73*, CDH1*, CDK4, CDKN1B*, CDKN2A, CHEK2*, CTNNA1*, DICER1*, EPCAM, EGFR, FH*, FLCN*, GREM1, HOXB13, KIT, LZTR1, MAX*, MBD4, MEN1*, MET, MITF, MLH1*, MSH2*, MSH3*, MSH6*, MUTYH*, NF1*, NF2*, NTHL1*, PALB2*, PDGFRA, PMS2*, POLD1*, POLE*, POT1*, PRKAR1A*, PTCH1*, PTEN*, RAD51C*, RAD51D*, RB1*, RET, SDHA*, SDHAF2*, SDHB*, SDHC*, SDHD*, SMAD4*, SMARCA4*, SMARCB1*, SMARCE1*, STK11*, SUFU*, TMEM127*, TP53*, TSC1*, TSC2*, VHL*. RNA analysis is performed for * genes.   10/20/2022 - 12/31/2022 Chemotherapy   Patient is on Treatment Plan : COLORECTAL FOLFIRI q14d     05/25/2023 -  Chemotherapy   Patient is on Treatment Plan : COLORECTAL CapeOx + Bevacizumab q21d       Discussed the use of AI scribe software for clinical note transcription with the patient, who gave verbal consent to proceed.  History of Present Illness   Joseph Haney, a 47 year old male with metastatic colon cancer, missed his scheduled chemotherapy appointment due to severe side effects from the previous session. He reports feeling unwell for about a week and a half after the last chemotherapy session, with symptoms including difficulty eating and significant pain. He also mentions a loss of weight. He took steroids, which provided some relief, but the pain returned after he stopped taking them. He has been experiencing pain in the tailbone area, which comes and goes. He denies having any sores in that area. He has been taking oxycodone and hydrocodone for pain, but reports that these medications only provide temporary relief. He also mentions that he has been able to start eating again in the past few days.          REVIEW OF SYSTEMS:   Constitutional: Denies fevers, chills or abnormal weight loss Eyes: Denies blurriness of vision Ears, nose, mouth, throat, and face: Denies mucositis or sore throat Respiratory: Denies cough, dyspnea or wheezes Cardiovascular: Denies palpitation, chest discomfort or lower extremity swelling Gastrointestinal:  Denies nausea, heartburn or change in bowel habits Skin: Denies abnormal skin rashes Lymphatics: Denies new lymphadenopathy or easy bruising Neurological:Denies numbness, tingling or new weaknesses Behavioral/Psych: Mood is stable, no new changes  All other systems were reviewed with the patient and are negative.  MEDICAL HISTORY:  Past Medical History:  Diagnosis Date   Alcohol abuse    Cancer (HCC)    GERD (gastroesophageal reflux disease)    Seizure (HCC)     SURGICAL HISTORY: Past Surgical History:  Procedure Laterality Date   BIOPSY  07/22/2021   Procedure: BIOPSY;  Surgeon: Kathi Der, MD;  Location: [facility];  Service: Gastroenterology;;   COLON SURGERY     COLONOSCOPY WITH PROPOFOL N/A 07/22/2021   Procedure: COLONOSCOPY WITH PROPOFOL;  Surgeon: Kathi Der, MD;  Location: [facility];  Service: Gastroenterology;  Laterality: N/A;   IR IMAGING GUIDED PORT INSERTION  10/17/2022   NO PAST SURGERIES     SUBMUCOSAL TATTOO INJECTION  07/22/2021   Procedure: SUBMUCOSAL TATTOO INJECTION;  Surgeon: Kathi Der, MD;  Location: [facility];  Service: Gastroenterology;;    I have reviewed the social history and family history with the patient and they are unchanged from previous note.  ALLERGIES:  is allergic to latex and morphine and codeine.  MEDICATIONS:  Current Outpatient Medications  Medication Sig Dispense  Refill   acetaminophen (TYLENOL 8 HOUR) 650 MG CR tablet Take 1 tablet (650 mg total) by mouth every 8 (eight) hours as needed for pain. 60 tablet 1   capecitabine (XELODA) 500 MG tablet Take 3 tablets  (1,500 mg total) by mouth 2 (two) times daily after a meal. Take for 14 days on, then 7 days off. Repeat every 21 days. Start with IV chemo 84 tablet 1   carvedilol (COREG) 25 MG tablet Take 1 tablet (25 mg total) by mouth 2 (two) times daily with a meal. 30 tablet 0   cyclobenzaprine (FLEXERIL) 10 MG tablet Take 10-20 mg by mouth 2 (two) times daily as needed for muscle spasms.     dexamethasone (DECADRON) 4 MG tablet Take 2 tablets (8 mg total) by mouth daily. Start the day after chemotherapy for 3-5 days. Take with food. 30 tablet 1   dexamethasone (DECADRON) 4 MG tablet Take 1 tablet (4 mg total) by mouth daily. Take for 3-5 days after chemo for nausea and fatigue 20 tablet 1   diphenoxylate-atropine (LOMOTIL) 2.5-0.025 MG tablet Take 1 tablet by mouth 4 (four) times daily as needed for diarrhea or loose stools. (Patient not taking: Reported on 01/07/2023) 30 tablet 0   escitalopram (LEXAPRO) 10 MG tablet Take 10 mg by mouth daily.     ferrous sulfate 325 (65 FE) MG tablet Take 1 tablet (325 mg total) by mouth 2 (two) times daily with a meal. (Patient not taking: Reported on 01/07/2023) 100 tablet 3   gabapentin (NEURONTIN) 300 MG capsule Take 300 mg by mouth 3 (three) times daily.     HYDROcodone-acetaminophen (NORCO/VICODIN) 5-325 MG tablet Take 1 tablet by mouth every 6 (six) hours as needed for moderate pain.     lidocaine-prilocaine (EMLA) cream Apply to affected area once 30 g 3   magic mouthwash (nystatin, lidocaine, diphenhydrAMINE, alum & mag hydroxide) suspension Take 5 mLs by mouth 3 (three) times daily as needed for mouth pain. (Patient not taking: Reported on 01/07/2023) 140 mL 1   ondansetron (ZOFRAN) 8 MG tablet Take 1 tablet (8 mg total) by mouth every 8 (eight) hours as needed for nausea or vomiting. Start on the third day after chemotherapy. 30 tablet 2   oxyCODONE (ROXICODONE) 5 MG immediate release tablet Take 1 tablet (5 mg total) by mouth every 6 (six) hours as needed for severe  pain. (Patient not taking: Reported on 01/07/2023) 20 tablet 0   Oxycodone HCl 10 MG TABS Take 10 mg by mouth every 6 (six) hours as needed.     polyethylene glycol (MIRALAX / GLYCOLAX) 17 g packet Take 17 g by mouth daily as needed for mild constipation or moderate constipation. (Patient not taking: Reported on 01/07/2023) 14 each 0   potassium chloride SA (KLOR-CON M) 20 MEQ tablet Take 1 tablet (20 mEq total) by mouth daily for 5 days. 30 tablet 0   prochlorperazine (COMPAZINE) 10 MG tablet Take 1 tablet (10 mg total) by mouth every 6 (six) hours as needed for nausea or vomiting. 30 tablet 2   promethazine (PHENERGAN) 25 MG tablet Take 1 tablet (25 mg total) by mouth every 6 (six) hours as needed for nausea or vomiting. 30 tablet 1   No current facility-administered medications for this visit.    PHYSICAL EXAMINATION: Not performed   LABORATORY DATA:  I have reviewed the data as listed    Latest Ref Rng & Units 07/27/2023    8:35 AM 06/15/2023  8:34 AM 05/25/2023    8:57 AM  CBC  WBC 4.0 - 10.5 K/uL 7.0  5.8  5.6   Hemoglobin 13.0 - 17.0 g/dL 78.2  95.6  21.3   Hematocrit 39.0 - 52.0 % 35.2  35.2  36.1   Platelets 150 - 400 K/uL 426  380  321         Latest Ref Rng & Units 07/27/2023    8:35 AM 06/15/2023    8:34 AM 05/25/2023    8:57 AM  CMP  Glucose 70 - 99 mg/dL 086  578  469   BUN 6 - 20 mg/dL 15  10  11    Creatinine 0.61 - 1.24 mg/dL 6.29  5.28  4.13   Sodium 135 - 145 mmol/L 140  137  138   Potassium 3.5 - 5.1 mmol/L 3.8  3.8  3.8   Chloride 98 - 111 mmol/L 105  101  103   CO2 22 - 32 mmol/L 29  29  29    Calcium 8.9 - 10.3 mg/dL 9.5  9.5  9.5   Total Protein 6.5 - 8.1 g/dL 9.3  8.5  8.2   Total Bilirubin <1.2 mg/dL 0.3  0.4  0.4   Alkaline Phos 38 - 126 U/L 230  239  204   AST 15 - 41 U/L 22  16  18    ALT 0 - 44 U/L 7  8  7        RADIOGRAPHIC STUDIES: I have personally reviewed the radiological images as listed and agreed with the findings in the report. No  results found.     I discussed the assessment and treatment plan with the patient. The patient was provided an opportunity to ask questions and all were answered. The patient agreed with the plan and demonstrated an understanding of the instructions.   The patient was advised to call back or seek an in-person evaluation if the symptoms worsen or if the condition fails to improve as anticipated.  I provided 21 minutes of non face-to-face telephone visit time during this encounter, and > 50% was spent counseling as documented under my assessment & plan.     Malachy Mood, MD 08/17/2023

## 2023-08-19 ENCOUNTER — Other Ambulatory Visit: Payer: Medicaid Other

## 2023-08-19 ENCOUNTER — Telehealth: Payer: Self-pay

## 2023-08-19 ENCOUNTER — Encounter: Payer: Self-pay | Admitting: Nurse Practitioner

## 2023-08-19 ENCOUNTER — Encounter: Payer: Self-pay | Admitting: Hematology

## 2023-08-19 ENCOUNTER — Ambulatory Visit: Payer: Medicaid Other

## 2023-08-19 NOTE — Telephone Encounter (Signed)
Open by mistake

## 2023-08-19 NOTE — Telephone Encounter (Signed)
Spoke with pt via telephone to inform pt that Karna Christmas in [place] Sched[facility] has been trying to call him to get him scheduled for his CT Scan.  Pt stated he has Peggy's telephone number and will give her a call now to schedule his CT Scan.  Pt had no further questions or concerns at this time.

## 2023-08-23 ENCOUNTER — Other Ambulatory Visit: Payer: Self-pay

## 2023-08-30 ENCOUNTER — Other Ambulatory Visit: Payer: Self-pay

## 2023-09-07 MED FILL — Fosaprepitant Dimeglumine For IV Infusion 150 MG (Base Eq): INTRAVENOUS | Qty: 5 | Status: AC

## 2023-09-07 NOTE — Assessment & Plan Note (Deleted)
 Stage IIIC (pT4a, pN2b, cM0), MSS, KRAS/NRAS/BRAF wild type -diagnosed in 07/2021 -s/p partial colectomy by Dr. Derrell Lolling 07/23/21, final path showed: pT4a lesion with LVI and PNI, no perforation; clear margins; 9/19 positive LNs, pN2b --He received Capox 09/11/21 - 01/15/22, oxali discontinued due to significant nausea/vomiting and headaches. He completed Xeloda in late June 2023.  -He unfortunately developed oligo liver metastasis based on PET/CT in 08/2022, liver biopsy was attempted but did not feasible.  -Guardian review was positive for circulating tumor DNA, consistent with recurrence. -Patient requested second opinion of surgery, was seen at [facility].  Neoadjuvant chemotherapy recommended before surgical resection, and he agreed. -He started chemotherapy FOLFIRI on 10/20/2022, no bevacizumab due to pending surgery  -NGS Tempus showed no targetable therapy, KRAS/NRAS/BRAF wild type, would not recommend EGFR inhibitor for first line due to his primary right side colon cancer. -he completed 3 months chemo in April 2024.  He was referred to liver surgeon Dr. Gwenlyn Perking at [facility], for patient's personal issue, his surgery has been postponed until recently.  Unfortunately his recent CT scan on 04/16/23 at [facility] showed possible bone metastasis at right Acetabulum, his liver resection was canceled.   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  He is at high risk for fracture. -I recommend restarting chemotherapy CAPOX and beva.  He started on 05/25/23. Cycle 3 was cancelled due to a death in family

## 2023-09-08 ENCOUNTER — Inpatient Hospital Stay: Payer: Medicaid Other | Admitting: Dietician

## 2023-09-08 ENCOUNTER — Inpatient Hospital Stay: Payer: Medicaid Other | Admitting: Hematology

## 2023-09-08 ENCOUNTER — Inpatient Hospital Stay: Payer: Medicaid Other

## 2023-09-08 ENCOUNTER — Inpatient Hospital Stay: Payer: Medicaid Other | Attending: Nurse Practitioner

## 2023-09-08 DIAGNOSIS — C182 Malignant neoplasm of ascending colon: Secondary | ICD-10-CM

## 2023-09-09 ENCOUNTER — Other Ambulatory Visit: Payer: Self-pay

## 2023-09-14 DIAGNOSIS — G894 Chronic pain syndrome: Secondary | ICD-10-CM | POA: Diagnosis not present

## 2023-09-14 DIAGNOSIS — Z79891 Long term (current) use of opiate analgesic: Secondary | ICD-10-CM | POA: Diagnosis not present

## 2023-09-14 DIAGNOSIS — F1910 Other psychoactive substance abuse, uncomplicated: Secondary | ICD-10-CM | POA: Diagnosis not present

## 2023-09-25 ENCOUNTER — Other Ambulatory Visit: Payer: Self-pay

## 2023-09-28 MED FILL — Fosaprepitant Dimeglumine For IV Infusion 150 MG (Base Eq): INTRAVENOUS | Qty: 5 | Status: AC

## 2023-09-28 NOTE — Progress Notes (Deleted)
 Patient Care Team: Frederic Jericho, [name] as PCP - General (Physician Assistant) Axel Filler, MD as Consulting Physician (General Surgery) Pollyann Samples, NP as Nurse Practitioner (Nurse Practitioner) Malachy Mood, MD as Consulting Physician (Hematology) Ewing Schlein, MD as Referring Physician (Pain Medicine)   CHIEF COMPLAINT: Follow up metastatic colon cancer   Oncology History Overview Note   Cancer Staging  Cancer of right colon [facility]) Staging form: Colon and Rectum, AJCC 8th Edition - Pathologic stage from 07/23/2021: Stage IIIC (pT4a, pN2b, cM0) - Signed by Pollyann Samples, NP on 08/15/2021 Stage prefix: Initial diagnosis Histologic grading system: 4 grade system Histologic grade (G): G2 Laterality: Right Lymph-vascular invasion (LVI): LVI present/identified, NOS Perineural invasion (PNI): Present Microsatellite instability (MSI): Stable     Cancer of right colon (HCC)  07/19/2021 Imaging   CT AP IMPRESSION: Focal area of high-grade narrowing of the proximal ascending colon may be sequela of chronic inflammation and stricture versus a mass. There is mild distension of the cecum and terminal ileum. Clinical correlation and further evaluation with lower GI study or colonoscopy recommended.   07/22/2021 Procedure   Colonoscopy A severe stenosis was found in the distal ascending colon and was non-traversed. It was ulcerated and inflamed. This could be inflammatory versus malignant stricture.   07/22/2021 Initial Biopsy   FINAL MICROSCOPIC DIAGNOSIS:  A. COLON, ASCENDING, BIOPSY:  - Invasive colorectal adenocarcinoma, moderately differentiated.    07/23/2021 Definitive Surgery   PRE-OPERATIVE DIAGNOSIS:  right colon mass POST-OPERATIVE DIAGNOSIS:  right colon mass PROCEDURE:  Procedure(s): XI ROBOT ASSISTED LAPAROSCOPIC RIGHT COLECTOMY (N/A) Dr. Derrell Lolling   07/23/2021 Pathology Results   FINAL MICROSCOPIC DIAGNOSIS:  A. COLON, RIGHT, RESECTION:  -  Invasive moderately differentiated colonic adenocarcinoma, 4.5 cm.  - Carcinoma extends into pericolonic connective tissue and focally  involves serosal surface.  - Margins not involved.  - Metastatic carcinoma in nine of nineteen lymph nodes (9/19).  - Unremarkable appendix. MMR normal, MSI-stable    07/23/2021 Cancer Staging   Staging form: Colon and Rectum, AJCC 8th Edition - Pathologic stage from 07/23/2021: Stage IIIC (pT4a, pN2b, cM0) - Signed by Pollyann Samples, NP on 08/15/2021 Stage prefix: Initial diagnosis Histologic grading system: 4 grade system Histologic grade (G): G2 Laterality: Right Lymph-vascular invasion (LVI): LVI present/identified, NOS Perineural invasion (PNI): Present Microsatellite instability (MSI): Stable   07/24/2021 Initial Diagnosis   Colon adenocarcinoma (HCC)   07/25/2021 Tumor Marker   CEA: 2.9   07/28/2021 Imaging   CT chest IMPRESSION: 1. No evidence of metastatic disease within the chest. 2. Lungs demonstrate atelectasis, most evident lower lobes and bases of the left upper lobe lingula and right middle lobe. No convincing pneumonia and no evidence of pulmonary edema.     09/11/2021 - 01/15/2022 Chemotherapy   Patient is on Treatment Plan : COLORECTAL Xelox (Capeox) q21d     08/2022 Progression   PET scan showed hypermetabolic oligo liver metastasis, biopsy attempted but liver lesion was not seen by [place]. GuardantReveal was also positive for circulating tumor DNA    09/02/2022 - 09/02/2022 Chemotherapy   Patient is on Treatment Plan : COLORECTAL FOLFIRI + Bevacizumab q14d      Genetic Testing   Negative genetic testing. No pathogenic variants identified on the [facility] Multi-Cancer+RNA panel. VUS in APC called [id number] identified. The report date is 09/15/2022.  The Multi-Cancer + RNA Panel offered by [facility] includes sequencing and/or deletion/duplication analysis of the following 70 genes:  AIP*, ALK, APC*, ATM*, AXIN2*, BAP1*, BARD1*,  BLM*, BMPR1A*, BRCA1*, BRCA2*, BRIP1*, CDC73*, CDH1*, CDK4, CDKN1B*, CDKN2A, CHEK2*, CTNNA1*, DICER1*, EPCAM, EGFR, FH*, FLCN*, GREM1, HOXB13, KIT, LZTR1, MAX*, MBD4, MEN1*, MET, MITF, MLH1*, MSH2*, MSH3*, MSH6*, MUTYH*, NF1*, NF2*, NTHL1*, PALB2*, PDGFRA, PMS2*, POLD1*, POLE*, POT1*, PRKAR1A*, PTCH1*, PTEN*, RAD51C*, RAD51D*, RB1*, RET, SDHA*, SDHAF2*, SDHB*, SDHC*, SDHD*, SMAD4*, SMARCA4*, SMARCB1*, SMARCE1*, STK11*, SUFU*, TMEM127*, TP53*, TSC1*, TSC2*, VHL*. RNA analysis is performed for * genes.   10/20/2022 - 12/31/2022 Chemotherapy   Patient is on Treatment Plan : COLORECTAL FOLFIRI q14d     05/25/2023 -  Chemotherapy   Patient is on Treatment Plan : COLORECTAL CapeOx + Bevacizumab q21d        CURRENT THERAPY:   INTERVAL HISTORY   ROS   Past Medical History:  Diagnosis Date   Alcohol abuse    Cancer (HCC)    GERD (gastroesophageal reflux disease)    Seizure (HCC)      Past Surgical History:  Procedure Laterality Date   BIOPSY  07/22/2021   Procedure: BIOPSY;  Surgeon: Kathi Der, MD;  Location: [facility];  Service: Gastroenterology;;   COLON SURGERY     COLONOSCOPY WITH PROPOFOL N/A 07/22/2021   Procedure: COLONOSCOPY WITH PROPOFOL;  Surgeon: Kathi Der, MD;  Location: [facility];  Service: Gastroenterology;  Laterality: N/A;   IR IMAGING GUIDED PORT INSERTION  10/17/2022   NO PAST SURGERIES     SUBMUCOSAL TATTOO INJECTION  07/22/2021   Procedure: SUBMUCOSAL TATTOO INJECTION;  Surgeon: Kathi Der, MD;  Location: [facility];  Service: Gastroenterology;;     Outpatient Encounter Medications as of 09/29/2023  Medication Sig Note   acetaminophen (TYLENOL 8 HOUR) 650 MG CR tablet Take 1 tablet (650 mg total) by mouth every 8 (eight) hours as needed for pain.    capecitabine (XELODA) 500 MG tablet Take 3 tablets (1,500 mg total) by mouth 2 (two) times daily after a meal. Take for 14 days on, then 7 days off. Repeat every 21 days. Start with IV chemo     carvedilol (COREG) 25 MG tablet Take 1 tablet (25 mg total) by mouth 2 (two) times daily with a meal.    cyclobenzaprine (FLEXERIL) 10 MG tablet Take 10-20 mg by mouth 2 (two) times daily as needed for muscle spasms.    dexamethasone (DECADRON) 4 MG tablet Take 2 tablets (8 mg total) by mouth daily. Start the day after chemotherapy for 3-5 days. Take with food.    dexamethasone (DECADRON) 4 MG tablet Take 1 tablet (4 mg total) by mouth daily. Take for 3-5 days after chemo for nausea and fatigue    diphenoxylate-atropine (LOMOTIL) 2.5-0.025 MG tablet Take 1 tablet by mouth 4 (four) times daily as needed for diarrhea or loose stools. (Patient not taking: Reported on 01/07/2023)    escitalopram (LEXAPRO) 10 MG tablet Take 10 mg by mouth daily.    ferrous sulfate 325 (65 FE) MG tablet Take 1 tablet (325 mg total) by mouth 2 (two) times daily with a meal. (Patient not taking: Reported on 01/07/2023)    gabapentin (NEURONTIN) 300 MG capsule Take 300 mg by mouth 3 (three) times daily.    HYDROcodone-acetaminophen (NORCO/VICODIN) 5-325 MG tablet Take 1 tablet by mouth every 6 (six) hours as needed for moderate pain.    lidocaine-prilocaine (EMLA) cream Apply to affected area once    magic mouthwash (nystatin, lidocaine, diphenhydrAMINE, alum & mag hydroxide) suspension Take 5 mLs by mouth 3 (three) times daily as needed for mouth pain. (Patient not taking: Reported on 01/07/2023)  ondansetron (ZOFRAN) 8 MG tablet Take 1 tablet (8 mg total) by mouth every 8 (eight) hours as needed for nausea or vomiting. Start on the third day after chemotherapy.    oxyCODONE (ROXICODONE) 5 MG immediate release tablet Take 1 tablet (5 mg total) by mouth every 6 (six) hours as needed for severe pain. (Patient not taking: Reported on 01/07/2023)    Oxycodone HCl 10 MG TABS Take 10 mg by mouth every 6 (six) hours as needed. 01/07/2023: Pt states he takes this medication, unable to find dispense report   polyethylene glycol (MIRALAX /  GLYCOLAX) 17 g packet Take 17 g by mouth daily as needed for mild constipation or moderate constipation. (Patient not taking: Reported on 01/07/2023)    potassium chloride SA (KLOR-CON M) 20 MEQ tablet Take 1 tablet (20 mEq total) by mouth daily for 5 days.    prochlorperazine (COMPAZINE) 10 MG tablet Take 1 tablet (10 mg total) by mouth every 6 (six) hours as needed for nausea or vomiting.    promethazine (PHENERGAN) 25 MG tablet Take 1 tablet (25 mg total) by mouth every 6 (six) hours as needed for nausea or vomiting.    No facility-administered encounter medications on file as of 09/29/2023.     There were no vitals filed for this visit. There is no height or weight on file to calculate BMI.   PHYSICAL EXAM GENERAL:alert, no distress and comfortable SKIN: no rash  EYES: sclera clear NECK: without mass LYMPH:  no palpable cervical or supraclavicular lymphadenopathy  LUNGS: clear with normal breathing effort HEART: regular rate & rhythm, no lower extremity edema ABDOMEN: abdomen soft, non-tender and normal bowel sounds NEURO: alert & oriented x 3 with fluent speech, no focal motor/sensory deficits Breast exam:  PAC without erythema    CBC    Component Value Date/Time   WBC 7.0 07/27/2023 0835   WBC 7.9 01/10/2023 0541   RBC 4.08 (L) 07/27/2023 0835   HGB 11.5 (L) 07/27/2023 0835   HCT 35.2 (L) 07/27/2023 0835   PLT 426 (H) 07/27/2023 0835   MCV 86.3 07/27/2023 0835   MCH 28.2 07/27/2023 0835   MCHC 32.7 07/27/2023 0835   RDW 15.5 07/27/2023 0835   LYMPHSABS 2.1 07/27/2023 0835   MONOABS 0.3 07/27/2023 0835   EOSABS 0.2 07/27/2023 0835   BASOSABS 0.0 07/27/2023 0835     CMP     Component Value Date/Time   NA 140 07/27/2023 0835   K 3.8 07/27/2023 0835   CL 105 07/27/2023 0835   CO2 29 07/27/2023 0835   GLUCOSE 105 (H) 07/27/2023 0835   BUN 15 07/27/2023 0835   CREATININE 0.70 07/27/2023 0835   CREATININE 0.62 10/21/2012 1530   CALCIUM 9.5 07/27/2023 0835   PROT 9.3  (H) 07/27/2023 0835   ALBUMIN 4.1 07/27/2023 0835   AST 22 07/27/2023 0835   ALT 7 07/27/2023 0835   ALKPHOS 230 (H) 07/27/2023 0835   BILITOT 0.3 07/27/2023 0835   GFRNONAA >60 07/27/2023 0835   GFRNONAA >89 10/21/2012 1530   GFRAA >60 11/28/2015 0501   GFRAA >89 10/21/2012 1530     ASSESSMENT & PLAN:Joseph Haney is a 48 y.o. male with    Cancer of right colon, Stage IIIC (pT4a, pN2b, cM0), MSS; recurrence in liver 07/2022; KRAS/NRAS/BRAF wild type, PIK3CA + -diagnosed in 07/2021 -s/p partial colectomy by Dr. Derrell Lolling 07/23/21, final path showed: pT4a lesion with LVI and PNI, no perforation; clear margins; 9/19 positive LNs, pN2b -He received Capox 09/11/21 -  01/15/22, oxali discontinued due to significant nausea/vomiting and headaches. He completed Xeloda in late June 2023.  -on surveillance  -CT 07/2022 showed new hepatic subsegment VIII lesion just along the margin of the middle hepatic vein near the dome of the RIGHT hemiliver, and enlarging RIGHT upper lobe pulmonary nodule suspicious for metastatic disease.  The liver lesion was hypermetabolic on PET scan, but lung nodule was not  -liver biopsy was attempted but not feasible  - GuardantReveal was positive for circulating tumor DNA, consistent with recurrence -He requested second opinion at [facility].  Neoadjuvant chemo recommended before surgical resection, he agreed.  Port placed -NGS Tempus showed KRAS/NRAS/BRAF wild type, PIK3CA +. We discussed even though he is a candidate for EGFR inhibitor, he would not benefit given that his primary tumor was right side colon cancer.  -Neoadjuvant chemo FOLFIRI 10/20/22 - 12/2022, surgery was postponed due to personal reasons. 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     All questions were answered. The patient knows to call the clinic with any problems, questions or concerns. No barriers to learning were detected. I spent *** counseling the patient face to face. The total time spent in the appointment was *** and more than 50% was on counseling, review of test results, and coordination of care.   Santiago Glad, NP-C @DATE @

## 2023-09-29 ENCOUNTER — Inpatient Hospital Stay: Payer: Medicaid Other

## 2023-09-29 ENCOUNTER — Inpatient Hospital Stay: Payer: Medicaid Other | Admitting: Nurse Practitioner

## 2023-09-29 ENCOUNTER — Telehealth: Payer: Self-pay

## 2023-09-29 ENCOUNTER — Other Ambulatory Visit: Payer: Self-pay | Admitting: Nurse Practitioner

## 2023-09-29 ENCOUNTER — Telehealth: Payer: Self-pay | Admitting: *Deleted

## 2023-09-29 ENCOUNTER — Inpatient Hospital Stay: Payer: Medicaid Other | Admitting: Dietician

## 2023-09-29 DIAGNOSIS — C182 Malignant neoplasm of ascending colon: Secondary | ICD-10-CM

## 2023-09-29 NOTE — Telephone Encounter (Signed)
Called pt number on file as well as brother and friend number on file to f/u with pt about missed appts this am. Pt has a nonworking number, brother did not answer and not able to leave a vmail, and friend number on file was also invalid.

## 2023-09-29 NOTE — Telephone Encounter (Signed)
This nurse and Boykin Reaper, LPN both tried contacting the pt and pt's listed contacts on file.  No answer at either numbers.  Pt's number on file is no longer in service.  Unable to leave a voicemail at either numbers.  Documented appts today as NO SHOW.

## 2023-09-30 ENCOUNTER — Other Ambulatory Visit: Payer: Self-pay

## 2023-10-01 ENCOUNTER — Telehealth: Payer: Self-pay | Admitting: *Deleted

## 2023-10-01 NOTE — Telephone Encounter (Signed)
Spoke with pt brother, he stated that his brother's phone is off and he is working to get his telephone back on. He stated that he is going over to his mother's house this evening and will let him know to call us tomorrow.

## 2023-10-05 ENCOUNTER — Inpatient Hospital Stay: Payer: Medicaid Other | Attending: Nurse Practitioner | Admitting: Licensed Clinical Social Worker

## 2023-10-05 DIAGNOSIS — C787 Secondary malignant neoplasm of liver and intrahepatic bile duct: Secondary | ICD-10-CM | POA: Insufficient documentation

## 2023-10-05 DIAGNOSIS — R630 Anorexia: Secondary | ICD-10-CM | POA: Insufficient documentation

## 2023-10-05 DIAGNOSIS — Z9049 Acquired absence of other specified parts of digestive tract: Secondary | ICD-10-CM | POA: Insufficient documentation

## 2023-10-05 DIAGNOSIS — C182 Malignant neoplasm of ascending colon: Secondary | ICD-10-CM | POA: Insufficient documentation

## 2023-10-05 DIAGNOSIS — R530 Neoplastic (malignant) related fatigue: Secondary | ICD-10-CM | POA: Insufficient documentation

## 2023-10-05 NOTE — Assessment & Plan Note (Deleted)
 Stage IIIC (pT4a, pN2b, cM0), MSS, KRAS/NRAS/BRAF wild type -diagnosed in 07/2021 -s/p partial colectomy by Dr. Derrell Lolling 07/23/21, final path showed: pT4a lesion with LVI and PNI, no perforation; clear margins; 9/19 positive LNs, pN2b --He received Capox 09/11/21 - 01/15/22, oxali discontinued due to significant nausea/vomiting and headaches. He completed Xeloda in late June 2023.  -He unfortunately developed oligo liver metastasis based on PET/CT in 08/2022, liver biopsy was attempted but did not feasible.  -Guardian review was positive for circulating tumor DNA, consistent with recurrence. -Patient requested second opinion of surgery, was seen at [facility].  Neoadjuvant chemotherapy recommended before surgical resection, and he agreed. -He started chemotherapy FOLFIRI on 10/20/2022, no bevacizumab due to pending surgery  -NGS Tempus showed no targetable therapy, KRAS/NRAS/BRAF wild type, would not recommend EGFR inhibitor for first line due to his primary right side colon cancer. -he completed 3 months chemo in April 2024.  He was referred to liver surgeon Dr. Gwenlyn Perking at [facility], for patient's personal issue, his surgery has been postponed until recently.  Unfortunately his recent CT scan on 04/16/23 at [facility] showed possible bone metastasis at right Acetabulum, his liver resection was canceled.   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  He is at high risk for fracture. -I recommend restarting chemotherapy CAPOX and beva.  He started on 05/25/23. Cycle 3 was cancelled due to a death in family. He tolerated chemo poorly and has not returned for treatment after cycle 3 on 1125/24

## 2023-10-05 NOTE — Progress Notes (Signed)
[facility] CSW Progress Note  Visual merchandiser  received a call from pt's mother, Joseph Haney.  Per pt's mother she has now moved him into her home where he will be staying until he is stronger and doing better overall medically.  Joseph Haney was calling to see if there is a support group pt can join.  CSW explained the challenges the medical team has experienced engaging pt in treatment.  It is unlikely pt would engage in a support group if he is not engaging in treatment.  Joseph Haney assured CSW she will be assisting pt moving forward and will make sure he makes it to his next treatment.  Pt's mother inquired about Dr. Mosetta Putt seeing pt prior to his 2/18 appointment as he has painful bumps in his rectum which is making it difficult to sit.  CSW contacted medical team who scheduled pt for 2/4 at 9:40.  Pt's mother informed and will bring pt in tomorrow.  CSW will meet w/ both pt and his mother at his infusion appointment on 2/18 to encourage pt to engage in counseling and supportive services.        Rachel Moulds, LCSW Clinical Social Worker [facility]

## 2023-10-06 ENCOUNTER — Inpatient Hospital Stay: Payer: Medicaid Other | Admitting: Hematology

## 2023-10-06 DIAGNOSIS — C182 Malignant neoplasm of ascending colon: Secondary | ICD-10-CM

## 2023-10-07 ENCOUNTER — Telehealth: Payer: Self-pay

## 2023-10-07 NOTE — Telephone Encounter (Signed)
 Pt's mom LVM on [facility] voicemail stating pt is having stomach and rectal pain.  Pt's mom stated the pt is needing to be seen.  Notified Dr. Lanny of pt's mom's call and sent a message to the scheduler to contact pt and mom to get pt scheduled for f/u appt again.

## 2023-10-09 ENCOUNTER — Telehealth: Payer: Self-pay | Admitting: Hematology

## 2023-10-11 NOTE — Assessment & Plan Note (Deleted)
 Stage IIIC (pT4a, pN2b, cM0), MSS, KRAS/NRAS/BRAF wild type -diagnosed in 07/2021 -s/p partial colectomy by Dr. Derrell Lolling 07/23/21, final path showed: pT4a lesion with LVI and PNI, no perforation; clear margins; 9/19 positive LNs, pN2b --He received Capox 09/11/21 - 01/15/22, oxali discontinued due to significant nausea/vomiting and headaches. He completed Xeloda in late June 2023.  -He unfortunately developed oligo liver metastasis based on PET/CT in 08/2022, liver biopsy was attempted but did not feasible.  -Guardian review was positive for circulating tumor DNA, consistent with recurrence. -Patient requested second opinion of surgery, was seen at [facility].  Neoadjuvant chemotherapy recommended before surgical resection, and he agreed. -He started chemotherapy FOLFIRI on 10/20/2022, no bevacizumab due to pending surgery  -NGS Tempus showed no targetable therapy, KRAS/NRAS/BRAF wild type, would not recommend EGFR inhibitor for first line due to his primary right side colon cancer. -he completed 3 months chemo in April 2024.  He was referred to liver surgeon Dr. Gwenlyn Perking at [facility], for patient's personal issue, his surgery has been postponed until recently.  Unfortunately his recent CT scan on 04/16/23 at [facility] showed possible bone metastasis at right Acetabulum, his liver resection was canceled.   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  He is at high risk for fracture. -I recommend restarting chemotherapy CAPOX and beva.  He started on 05/25/23. Cycle 3 was cancelled due to a death in family. He tolerated chemo poorly and has not returned for treatment after cycle 3 on 1125/24

## 2023-10-12 ENCOUNTER — Inpatient Hospital Stay: Payer: Medicaid Other | Admitting: Hematology

## 2023-10-12 ENCOUNTER — Inpatient Hospital Stay: Payer: Medicaid Other

## 2023-10-12 DIAGNOSIS — C182 Malignant neoplasm of ascending colon: Secondary | ICD-10-CM

## 2023-10-13 DIAGNOSIS — Z79891 Long term (current) use of opiate analgesic: Secondary | ICD-10-CM | POA: Diagnosis not present

## 2023-10-13 DIAGNOSIS — C229 Malignant neoplasm of liver, not specified as primary or secondary: Secondary | ICD-10-CM | POA: Diagnosis not present

## 2023-10-13 DIAGNOSIS — C189 Malignant neoplasm of colon, unspecified: Secondary | ICD-10-CM | POA: Diagnosis not present

## 2023-10-13 DIAGNOSIS — G894 Chronic pain syndrome: Secondary | ICD-10-CM | POA: Diagnosis not present

## 2023-10-13 DIAGNOSIS — M542 Cervicalgia: Secondary | ICD-10-CM | POA: Diagnosis not present

## 2023-10-13 DIAGNOSIS — G8929 Other chronic pain: Secondary | ICD-10-CM | POA: Diagnosis not present

## 2023-10-13 DIAGNOSIS — M5416 Radiculopathy, lumbar region: Secondary | ICD-10-CM | POA: Diagnosis not present

## 2023-10-14 ENCOUNTER — Other Ambulatory Visit: Payer: Self-pay

## 2023-10-15 ENCOUNTER — Telehealth: Payer: Self-pay | Admitting: Hematology

## 2023-10-15 NOTE — Telephone Encounter (Signed)
Left patient's mother a voicemail regarding scheduled appointment times/dates, was unable to leave patient a message due to voicemail box being full

## 2023-10-19 MED FILL — Fosaprepitant Dimeglumine For IV Infusion 150 MG (Base Eq): INTRAVENOUS | Qty: 5 | Status: AC

## 2023-10-19 NOTE — Assessment & Plan Note (Deleted)
 Stage IIIC (pT4a, pN2b, cM0), MSS, KRAS/NRAS/BRAF wild type -diagnosed in 07/2021 -s/p partial colectomy by Dr. Derrell Lolling 07/23/21, final path showed: pT4a lesion with LVI and PNI, no perforation; clear margins; 9/19 positive LNs, pN2b --He received Capox 09/11/21 - 01/15/22, oxali discontinued due to significant nausea/vomiting and headaches. He completed Xeloda in late June 2023.  -He unfortunately developed oligo liver metastasis based on PET/CT in 08/2022, liver biopsy was attempted but did not feasible.  -Guardian review was positive for circulating tumor DNA, consistent with recurrence. -Patient requested second opinion of surgery, was seen at [facility].  Neoadjuvant chemotherapy recommended before surgical resection, and he agreed. -He started chemotherapy FOLFIRI on 10/20/2022, no bevacizumab due to pending surgery  -NGS Tempus showed no targetable therapy, KRAS/NRAS/BRAF wild type, would not recommend EGFR inhibitor for first line due to his primary right side colon cancer. -he completed 3 months chemo in April 2024.  He was referred to liver surgeon Dr. Gwenlyn Perking at [facility], for patient's personal issue, his surgery has been postponed until recently.  Unfortunately his recent CT scan on 04/16/23 at [facility] showed possible bone metastasis at right Acetabulum, his liver resection was canceled.   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  He is at high risk for fracture. -I recommend restarting chemotherapy CAPOX and beva.  He started on 05/25/23. Cycle 3 was cancelled due to a death in family. He tolerated chemo poorly and has not returned for treatment after cycle 3 on 1125/24

## 2023-10-20 ENCOUNTER — Inpatient Hospital Stay: Payer: Medicaid Other | Admitting: Dietician

## 2023-10-20 ENCOUNTER — Inpatient Hospital Stay: Payer: Medicaid Other | Admitting: Hematology

## 2023-10-20 ENCOUNTER — Telehealth: Payer: Self-pay

## 2023-10-20 ENCOUNTER — Inpatient Hospital Stay: Payer: Medicaid Other

## 2023-10-20 DIAGNOSIS — C182 Malignant neoplasm of ascending colon: Secondary | ICD-10-CM

## 2023-10-20 NOTE — Telephone Encounter (Signed)
 Pt will not be coming to appointments today 10/20/2023. Asked about having an appointment with dr. Mosetta Putt to be prescribed steroids because he does not feel well enough to come on site for treatments. If you need to contact him, use the mother's phone number. [phone].

## 2023-10-22 ENCOUNTER — Inpatient Hospital Stay (HOSPITAL_BASED_OUTPATIENT_CLINIC_OR_DEPARTMENT_OTHER): Payer: Medicaid Other | Admitting: Hematology

## 2023-10-22 ENCOUNTER — Telehealth: Payer: Self-pay | Admitting: Hematology

## 2023-10-22 DIAGNOSIS — C182 Malignant neoplasm of ascending colon: Secondary | ICD-10-CM

## 2023-10-22 MED ORDER — ONDANSETRON HCL 8 MG PO TABS: 8.0000 mg | ORAL_TABLET | Freq: Three times a day (TID) | ORAL | 2 refills | Status: DC | PRN

## 2023-10-22 MED ORDER — MEGESTROL ACETATE 625 MG/5ML PO SUSP: 625.0000 mg | Freq: Every day | ORAL | 0 refills | Status: DC

## 2023-10-22 MED ORDER — DEXAMETHASONE 4 MG PO TABS: 8.0000 mg | ORAL_TABLET | Freq: Every day | ORAL | 1 refills | Status: DC

## 2023-10-22 NOTE — Telephone Encounter (Signed)
 Left patient mother a voicemail in regards to scheduled telephone visit per provider request on 10/22/2023

## 2023-10-22 NOTE — Assessment & Plan Note (Signed)
 Stage IIIC (pT4a, pN2b, cM0), MSS, KRAS/NRAS/BRAF wild type -diagnosed in 07/2021 -s/p partial colectomy by Dr. Derrell Lolling 07/23/21, final path showed: pT4a lesion with LVI and PNI, no perforation; clear margins; 9/19 positive LNs, pN2b --He received Capox 09/11/21 - 01/15/22, oxali discontinued due to significant nausea/vomiting and headaches. He completed Xeloda in late June 2023.  -He unfortunately developed oligo liver metastasis based on PET/CT in 08/2022, liver biopsy was attempted but did not feasible.  -Guardian review was positive for circulating tumor DNA, consistent with recurrence. -Patient requested second opinion of surgery, was seen at [facility].  Neoadjuvant chemotherapy recommended before surgical resection, and he agreed. -He started chemotherapy FOLFIRI on 10/20/2022, no bevacizumab due to pending surgery  -NGS Tempus showed no targetable therapy, KRAS/NRAS/BRAF wild type, would not recommend EGFR inhibitor for first line due to his primary right side colon cancer. -he completed 3 months chemo in April 2024.  He was referred to liver surgeon Dr. Gwenlyn Perking at [facility], for patient's personal issue, his surgery has been postponed until recently.  Unfortunately his recent CT scan on 04/16/23 at [facility] showed possible bone metastasis at right Acetabulum, his liver resection was canceled.   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  He is at high risk for fracture. -I recommend restarting chemotherapy CAPOX and beva.  He started on 05/25/23. Cycle 3 was cancelled due to a death in family. He tolerated chemo poorly and has not returned for treatment after cycle 3 on 1125/24

## 2023-10-22 NOTE — Progress Notes (Signed)
 [facility]   Telephone:([phone]) [phone] Fax:(336) [phone]   Clinic Follow up Note   Patient Care Team: Frederic Jericho, [name] as PCP - General (Physician Assistant) Axel Filler, MD as Consulting Physician (General Surgery) Pollyann Samples, NP as Nurse Practitioner (Nurse Practitioner) Malachy Mood, MD as Consulting Physician (Hematology) Ewing Schlein, MD as Referring Physician (Pain Medicine) 10/22/2023  I connected with Joseph Haney on 10/22/23 at  3:00 PM EST by telephone and verified that I am speaking with the correct person using two identifiers.   I discussed the limitations, risks, security and privacy concerns of performing an evaluation and management service by telephone and the availability of in person appointments. I also discussed with the patient that there may be a patient responsible charge related to this service. The patient expressed understanding and agreed to proceed.   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 Derrell Lolling 07/23/21, final path showed: pT4a lesion with LVI and PNI, no perforation; clear margins; 9/19 positive LNs, pN2b --He received Capox 09/11/21 - 01/15/22, oxali discontinued due to significant nausea/vomiting and headaches. He completed Xeloda in late June 2023.  -He unfortunately developed oligo liver metastasis based on PET/CT in 08/2022, liver biopsy was attempted but did not feasible.  -Guardian review was positive for circulating tumor DNA, consistent with recurrence. -Patient requested second opinion of surgery, was seen at [facility].  Neoadjuvant chemotherapy recommended before surgical resection, and he agreed. -He started chemotherapy FOLFIRI on 10/20/2022, no  bevacizumab due to pending surgery  -NGS [facility] showed no targetable therapy, KRAS/NRAS/BRAF wild type, would not recommend EGFR inhibitor for first line due to his primary right side colon cancer. -he completed 3 months chemo in April 2024.  He was referred to liver surgeon Dr. Gwenlyn Perking at [facility], for patient's personal issue, his surgery has been postponed until recently.  Unfortunately his recent CT scan on 04/16/23 at [facility] showed possible bone metastasis at right Acetabulum, his liver resection was canceled.   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  He is at high risk for fracture. -I recommend restarting chemotherapy CAPOX and beva.  He started on 05/25/23. Cycle 3 was cancelled due to a death in family. He tolerated chemo poorly and has not returned for treatment after cycle 3 on 1125/24  Assessment and Plan    Metastatic colon cancer -Patient has not returned to office since his last chemo treatment in November 2024, and developed multiple symptoms including fatigue, anorexia, and pain are related to the underlying cancer. Currently too weak to tolerate chemotherapy. Discussed hospice care for symptom management at home, but prefers current medication adjustments first. Explained that hospice care can be stopped if condition improves and chemotherapy becomes an option again. - Set up a follow-up phone call in two weeks to reassess symptoms and overall condition  Cancer-related Fatigue and Anorexia Severe fatigue and anorexia, likely secondary to underlying cancer. Reports eating barely once a day and experiencing nausea. Currently too weak to tolerate chemotherapy. Discussed the use of megestrol acetate to boost appetite and energy levels. - Prescribe megestrol acetate, 1 tablet daily - Prescribe ondansetron for nausea  Low Back Pain with Sciatica Chronic  low back pain with an intensity of 8/10, radiating to the leg, suggestive of sciatica. Limited mobility, primarily walking to the bathroom and spending most of the time lying down. Pain partially managed with fentanyl patches and oxycodone prescribed by a pain management specialist. Discussed the use of dexamethasone to potentially improve appetite and energy levels. - Prescribe dexamethasone, 1 tablet daily - Prescribe ondansetron for nausea  Plan -Symptom management reviewed with patient and his mother, I reviewed Compazine, dexamethasone, and called in Megace for him. -He will follow-up with his pain specialist for fentanyl and oxycodone refills -I encouraged him to consider hospice home care service, he declined. - Set up a follow-up phone call in two weeks.         SUMMARY OF ONCOLOGIC HISTORY: Oncology History Overview Note   Cancer Staging  Cancer of right colon [facility]) Staging form: Colon and Rectum, AJCC 8th Edition - Pathologic stage from 07/23/2021: Stage IIIC (pT4a, pN2b, cM0) - Signed by Pollyann Samples, NP on 08/15/2021 Stage prefix: Initial diagnosis Histologic grading system: 4 grade system Histologic grade (G): G2 Laterality: Right Lymph-vascular invasion (LVI): LVI present/identified, NOS Perineural invasion (PNI): Present Microsatellite instability (MSI): Stable     Cancer of right colon (HCC)  07/19/2021 Imaging   CT AP IMPRESSION: Focal area of high-grade narrowing of the proximal ascending colon may be sequela of chronic inflammation and stricture versus a mass. There is mild distension of the cecum and terminal ileum. Clinical correlation and further evaluation with lower GI study or colonoscopy recommended.   07/22/2021 Procedure   Colonoscopy A severe stenosis was found in the distal ascending colon and was non-traversed. It was ulcerated and inflamed. This could be inflammatory versus malignant stricture.   07/22/2021 Initial Biopsy   FINAL  MICROSCOPIC DIAGNOSIS:  A. COLON, ASCENDING, BIOPSY:  - Invasive colorectal adenocarcinoma, moderately differentiated.    07/23/2021 Definitive Surgery   PRE-OPERATIVE DIAGNOSIS:  right colon mass POST-OPERATIVE DIAGNOSIS:  right colon mass PROCEDURE:  Procedure(s): XI ROBOT ASSISTED LAPAROSCOPIC RIGHT COLECTOMY (N/A) Dr. Derrell Lolling   07/23/2021 Pathology Results   FINAL MICROSCOPIC DIAGNOSIS:  A. COLON, RIGHT, RESECTION:  - Invasive moderately differentiated colonic adenocarcinoma, 4.5 cm.  - Carcinoma extends into pericolonic connective tissue and focally  involves serosal surface.  - Margins not involved.  - Metastatic carcinoma in nine of nineteen lymph nodes (9/19).  - Unremarkable appendix. MMR normal, MSI-stable    07/23/2021 Cancer Staging   Staging form: Colon and Rectum, AJCC 8th Edition - Pathologic stage from 07/23/2021: Stage IIIC (pT4a, pN2b, cM0) - Signed by Pollyann Samples, NP on 08/15/2021 Stage prefix: Initial diagnosis Histologic grading system: 4 grade system Histologic grade (G): G2 Laterality: Right Lymph-vascular invasion (LVI): LVI present/identified, NOS Perineural invasion (PNI): Present Microsatellite instability (MSI): Stable   07/24/2021 Initial Diagnosis   Colon adenocarcinoma (HCC)   07/25/2021 Tumor Marker   CEA: 2.9   07/28/2021 Imaging   CT chest IMPRESSION: 1. No evidence of metastatic disease within the chest. 2. Lungs demonstrate atelectasis, most evident lower lobes and bases of the left upper lobe lingula and right middle lobe. No convincing pneumonia and no evidence of pulmonary edema.     09/11/2021 - 01/15/2022 Chemotherapy   Patient is on Treatment Plan : COLORECTAL Xelox (Capeox) q21d     08/2022 Progression   PET scan showed hypermetabolic oligo liver metastasis, biopsy attempted but liver lesion was not seen by [place]. GuardantReveal was also positive for circulating  tumor DNA    09/02/2022 - 09/02/2022 Chemotherapy   Patient is  on Treatment Plan : COLORECTAL FOLFIRI + Bevacizumab q14d      Genetic Testing   Negative genetic testing. No pathogenic variants identified on the [facility] Multi-Cancer+RNA panel. VUS in APC called [id number] identified. The report date is 09/15/2022.  The Multi-Cancer + RNA Panel offered by [facility] includes sequencing and/or deletion/duplication analysis of the following 70 genes:  AIP*, ALK, APC*, ATM*, AXIN2*, BAP1*, BARD1*, BLM*, BMPR1A*, BRCA1*, BRCA2*, BRIP1*, CDC73*, CDH1*, CDK4, CDKN1B*, CDKN2A, CHEK2*, CTNNA1*, DICER1*, EPCAM, EGFR, FH*, FLCN*, GREM1, HOXB13, KIT, LZTR1, MAX*, MBD4, MEN1*, MET, MITF, MLH1*, MSH2*, MSH3*, MSH6*, MUTYH*, NF1*, NF2*, NTHL1*, PALB2*, PDGFRA, PMS2*, POLD1*, POLE*, POT1*, PRKAR1A*, PTCH1*, PTEN*, RAD51C*, RAD51D*, RB1*, RET, SDHA*, SDHAF2*, SDHB*, SDHC*, SDHD*, SMAD4*, SMARCA4*, SMARCB1*, SMARCE1*, STK11*, SUFU*, TMEM127*, TP53*, TSC1*, TSC2*, VHL*. RNA analysis is performed for * genes.   10/20/2022 - 12/31/2022 Chemotherapy   Patient is on Treatment Plan : COLORECTAL FOLFIRI q14d     05/25/2023 -  Chemotherapy   Patient is on Treatment Plan : COLORECTAL CapeOx + Bevacizumab q21d       Discussed the use of AI scribe software for clinical note transcription with the patient, who gave verbal consent to proceed.  History of Present Illness   The patient, with a history of cancer, presents with severe low back pain, rating it an 8 out of 10. He believes the pain is due to a previous injury that caused the bones to move away from each other and rub against each other, possibly indicating a disc herniation. The pain radiates down his leg, suggesting sciatica. He is able to walk to the bathroom but has not been able to leave the house or go to the store. He spends most of his time lying down and relies on his mother to prepare meals for him.  He has a significantly reduced appetite, eating barely once a day, and experiences nausea. He has been out of nausea  medication and has been staying with his mother. He has been prescribed dexamethasone, a steroid, but has not been taking it regularly. He also has a pain management doctor who has prescribed fentanyl patches and oxycodone, but the pain relief is temporary.  The patient also mentions fatigue and a lack of energy, which he attributes to his cancer and the emotional toll of recent events. He expresses a desire to regain his strength and appetite, and to potentially resume chemotherapy once he is feeling better.         REVIEW OF SYSTEMS:   Constitutional: Denies fevers, chills or abnormal weight loss Eyes: Denies blurriness of vision Ears, nose, mouth, throat, and face: Denies mucositis or sore throat Respiratory: Denies cough, dyspnea or wheezes Cardiovascular: Denies palpitation, chest discomfort or lower extremity swelling Gastrointestinal:  Denies nausea, heartburn or change in bowel habits Skin: Denies abnormal skin rashes Lymphatics: Denies new lymphadenopathy or easy bruising Neurological:Denies numbness, tingling or new weaknesses Behavioral/Psych: Mood is stable, no new changes  All other systems were reviewed with the patient and are negative.  MEDICAL HISTORY:  Past Medical History:  Diagnosis Date   Alcohol abuse    Cancer (HCC)    GERD (gastroesophageal reflux disease)    Seizure (HCC)     SURGICAL HISTORY: Past Surgical History:  Procedure Laterality Date   BIOPSY  07/22/2021   Procedure: BIOPSY;  Surgeon: Kathi Der, MD;  Location: [facility];  Service: Gastroenterology;;   COLON SURGERY  COLONOSCOPY WITH PROPOFOL N/A 07/22/2021   Procedure: COLONOSCOPY WITH PROPOFOL;  Surgeon: Kathi Der, MD;  Location: [facility];  Service: Gastroenterology;  Laterality: N/A;   IR IMAGING GUIDED PORT INSERTION  10/17/2022   NO PAST SURGERIES     SUBMUCOSAL TATTOO INJECTION  07/22/2021   Procedure: SUBMUCOSAL TATTOO INJECTION;  Surgeon: Kathi Der, MD;   Location: [facility];  Service: Gastroenterology;;    I have reviewed the social history and family history with the patient and they are unchanged from previous note.  ALLERGIES:  is allergic to latex and morphine and codeine.  MEDICATIONS:  Current Outpatient Medications  Medication Sig Dispense Refill   megestrol (MEGACE ES) 625 MG/5ML suspension Take 5 mLs (625 mg total) by mouth daily. 150 mL 0   acetaminophen (TYLENOL 8 HOUR) 650 MG CR tablet Take 1 tablet (650 mg total) by mouth every 8 (eight) hours as needed for pain. 60 tablet 1   capecitabine (XELODA) 500 MG tablet Take 3 tablets (1,500 mg total) by mouth 2 (two) times daily after a meal. Take for 14 days on, then 7 days off. Repeat every 21 days. Start with IV chemo 84 tablet 1   carvedilol (COREG) 25 MG tablet Take 1 tablet (25 mg total) by mouth 2 (two) times daily with a meal. 30 tablet 0   cyclobenzaprine (FLEXERIL) 10 MG tablet Take 10-20 mg by mouth 2 (two) times daily as needed for muscle spasms.     dexamethasone (DECADRON) 4 MG tablet Take 1 tablet (4 mg total) by mouth daily. Take for 3-5 days after chemo for nausea and fatigue 20 tablet 1   dexamethasone (DECADRON) 4 MG tablet Take 2 tablets (8 mg total) by mouth daily. 30 tablet 1   diphenoxylate-atropine (LOMOTIL) 2.5-0.025 MG tablet Take 1 tablet by mouth 4 (four) times daily as needed for diarrhea or loose stools. (Patient not taking: Reported on 01/07/2023) 30 tablet 0   escitalopram (LEXAPRO) 10 MG tablet Take 10 mg by mouth daily.     ferrous sulfate 325 (65 FE) MG tablet Take 1 tablet (325 mg total) by mouth 2 (two) times daily with a meal. (Patient not taking: Reported on 01/07/2023) 100 tablet 3   gabapentin (NEURONTIN) 300 MG capsule Take 300 mg by mouth 3 (three) times daily.     HYDROcodone-acetaminophen (NORCO/VICODIN) 5-325 MG tablet Take 1 tablet by mouth every 6 (six) hours as needed for moderate pain.     lidocaine-prilocaine (EMLA) cream Apply to  affected area once 30 g 3   magic mouthwash (nystatin, lidocaine, diphenhydrAMINE, alum & mag hydroxide) suspension Take 5 mLs by mouth 3 (three) times daily as needed for mouth pain. (Patient not taking: Reported on 01/07/2023) 140 mL 1   ondansetron (ZOFRAN) 8 MG tablet Take 1 tablet (8 mg total) by mouth every 8 (eight) hours as needed for nausea or vomiting. Start on the third day after chemotherapy. 30 tablet 2   oxyCODONE (ROXICODONE) 5 MG immediate release tablet Take 1 tablet (5 mg total) by mouth every 6 (six) hours as needed for severe pain. (Patient not taking: Reported on 01/07/2023) 20 tablet 0   Oxycodone HCl 10 MG TABS Take 10 mg by mouth every 6 (six) hours as needed.     polyethylene glycol (MIRALAX / GLYCOLAX) 17 g packet Take 17 g by mouth daily as needed for mild constipation or moderate constipation. (Patient not taking: Reported on 01/07/2023) 14 each 0   potassium chloride SA (KLOR-CON M) 20  MEQ tablet Take 1 tablet (20 mEq total) by mouth daily for 5 days. 30 tablet 0   prochlorperazine (COMPAZINE) 10 MG tablet Take 1 tablet (10 mg total) by mouth every 6 (six) hours as needed for nausea or vomiting. 30 tablet 2   promethazine (PHENERGAN) 25 MG tablet Take 1 tablet (25 mg total) by mouth every 6 (six) hours as needed for nausea or vomiting. 30 tablet 1   No current facility-administered medications for this visit.    PHYSICAL EXAMINATION: Not performed   LABORATORY DATA:  I have reviewed the data as listed    Latest Ref Rng & Units 07/27/2023    8:35 AM 06/15/2023    8:34 AM 05/25/2023    8:57 AM  CBC  WBC 4.0 - 10.5 K/uL 7.0  5.8  5.6   Hemoglobin 13.0 - 17.0 g/dL 16.1  09.6  04.5   Hematocrit 39.0 - 52.0 % 35.2  35.2  36.1   Platelets 150 - 400 K/uL 426  380  321         Latest Ref Rng & Units 07/27/2023    8:35 AM 06/15/2023    8:34 AM 05/25/2023    8:57 AM  CMP  Glucose 70 - 99 mg/dL 409  811  914   BUN 6 - 20 mg/dL 15  10  11    Creatinine 0.61 - 1.24 mg/dL  7.82  9.56  2.13   Sodium 135 - 145 mmol/L 140  137  138   Potassium 3.5 - 5.1 mmol/L 3.8  3.8  3.8   Chloride 98 - 111 mmol/L 105  101  103   CO2 22 - 32 mmol/L 29  29  29    Calcium 8.9 - 10.3 mg/dL 9.5  9.5  9.5   Total Protein 6.5 - 8.1 g/dL 9.3  8.5  8.2   Total Bilirubin <1.2 mg/dL 0.3  0.4  0.4   Alkaline Phos 38 - 126 U/L 230  239  204   AST 15 - 41 U/L 22  16  18    ALT 0 - 44 U/L 7  8  7        RADIOGRAPHIC STUDIES: I have personally reviewed the radiological images as listed and agreed with the findings in the report. No results found.     I discussed the assessment and treatment plan with the patient. The patient was provided an opportunity to ask questions and all were answered. The patient agreed with the plan and demonstrated an understanding of the instructions.   The patient was advised to call back or seek an in-person evaluation if the symptoms worsen or if the condition fails to improve as anticipated.  I provided 25 minutes of non face-to-face telephone visit time during this encounter, and > 50% was spent counseling as documented under my assessment & plan.     Malachy Mood, MD 10/22/23

## 2023-10-23 ENCOUNTER — Other Ambulatory Visit: Payer: Self-pay

## 2023-10-23 ENCOUNTER — Other Ambulatory Visit: Payer: Self-pay | Admitting: Nurse Practitioner

## 2023-10-23 MED ORDER — MEGESTROL ACETATE 625 MG/5ML PO SUSP: 625.0000 mg | Freq: Every day | ORAL | 0 refills | Status: DC

## 2023-10-26 ENCOUNTER — Other Ambulatory Visit: Payer: Self-pay | Admitting: Hematology

## 2023-10-26 ENCOUNTER — Other Ambulatory Visit: Payer: Self-pay

## 2023-10-26 ENCOUNTER — Telehealth: Payer: Self-pay

## 2023-10-26 ENCOUNTER — Telehealth: Payer: Self-pay | Admitting: *Deleted

## 2023-10-26 MED ORDER — MEGESTROL ACETATE 625 MG/5ML PO SUSP: 625.0000 mg | Freq: Every day | ORAL | 0 refills | Status: DC

## 2023-10-26 NOTE — Telephone Encounter (Signed)
 Secure Chat from CMA  that Daimen Berti's "family called stating pharmacy told them Megestrol Acetate ES is requiring a PA. Would you please check Covermymeds to resolve PA. Thank you."   Medication Prior Authorization Status  Megace ES Suspension PA created by this nurse.per CoverMyMeds  KEY: [id number]  Submitted Today as urgent.  Per [employer] Case ID: [id number]   Effective pending through pending.

## 2023-10-27 ENCOUNTER — Other Ambulatory Visit: Payer: Self-pay

## 2023-10-27 ENCOUNTER — Other Ambulatory Visit: Payer: Self-pay | Admitting: Nurse Practitioner

## 2023-10-27 MED ORDER — MEGESTROL ACETATE 400 MG/10ML PO SUSP: 400.0000 mg | Freq: Every day | ORAL | 1 refills | Status: DC

## 2023-10-27 NOTE — Progress Notes (Signed)
 Provider notified of prior authorization denial request for Megestrol Acetate 625mg /67ml Suspension. Medication changed to preferred strength of 400mg /22ml Suspension. No other needs or concerns noted at this time.

## 2023-10-28 ENCOUNTER — Encounter: Payer: Self-pay | Admitting: Nurse Practitioner

## 2023-10-28 ENCOUNTER — Encounter: Payer: Self-pay | Admitting: Hematology

## 2023-10-28 NOTE — Telephone Encounter (Signed)
 Open by accident

## 2023-10-30 ENCOUNTER — Encounter: Payer: Self-pay | Admitting: Nurse Practitioner

## 2023-10-30 ENCOUNTER — Encounter: Payer: Self-pay | Admitting: Hematology

## 2023-11-04 NOTE — Assessment & Plan Note (Signed)
 Stage IIIC (pT4a, pN2b, cM0), MSS, KRAS/NRAS/BRAF wild type -diagnosed in 07/2021 -s/p partial colectomy by Dr. Derrell Lolling 07/23/21, final path showed: pT4a lesion with LVI and PNI, no perforation; clear margins; 9/19 positive LNs, pN2b --He received Capox 09/11/21 - 01/15/22, oxali discontinued due to significant nausea/vomiting and headaches. He completed Xeloda in late June 2023.  -He unfortunately developed oligo liver metastasis based on PET/CT in 08/2022, liver biopsy was attempted but did not feasible.  -Guardian review was positive for circulating tumor DNA, consistent with recurrence. -Patient requested second opinion of surgery, was seen at [facility].  Neoadjuvant chemotherapy recommended before surgical resection, and he agreed. -He started chemotherapy FOLFIRI on 10/20/2022, no bevacizumab due to pending surgery  -NGS Tempus showed no targetable therapy, KRAS/NRAS/BRAF wild type, would not recommend EGFR inhibitor for first line due to his primary right side colon cancer. -he completed 3 months chemo in April 2024.  He was referred to liver surgeon Dr. Gwenlyn Perking at [facility], for patient's personal issue, his surgery has been postponed until recently.  Unfortunately his recent CT scan on 04/16/23 at [facility] showed possible bone metastasis at right Acetabulum, his liver resection was canceled.   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  He is at high risk for fracture. -I recommend restarting chemotherapy CAPOX and beva.  He started on 05/25/23. Cycle 3 was cancelled due to a death in family. He tolerated chemo poorly and has not returned for treatment after cycle 3 on 1125/24

## 2023-11-05 ENCOUNTER — Inpatient Hospital Stay: Payer: Medicaid Other | Attending: Nurse Practitioner | Admitting: Hematology

## 2023-11-05 DIAGNOSIS — C182 Malignant neoplasm of ascending colon: Secondary | ICD-10-CM

## 2023-11-05 NOTE — Progress Notes (Signed)
 Pt is scheduled for a phone visit.  I called pt, his number is not in service anymore.  I called his mother twice, no answer, and was not able to leave her a message.  Will reschedule   Malachy Mood  11/05/2023

## 2023-11-06 ENCOUNTER — Inpatient Hospital Stay (HOSPITAL_COMMUNITY)
Admission: EM | Admit: 2023-11-06 | Discharge: 2023-12-03 | DRG: 947 | Disposition: A | Discharge status: Hospice | Attending: Internal Medicine | Admitting: Internal Medicine

## 2023-11-06 ENCOUNTER — Emergency Department (HOSPITAL_COMMUNITY)

## 2023-11-06 ENCOUNTER — Other Ambulatory Visit: Payer: Self-pay

## 2023-11-06 DIAGNOSIS — Z91199 Patient's noncompliance with other medical treatment and regimen due to unspecified reason: Secondary | ICD-10-CM

## 2023-11-06 DIAGNOSIS — Z751 Person awaiting admission to adequate facility elsewhere: Secondary | ICD-10-CM

## 2023-11-06 DIAGNOSIS — R0789 Other chest pain: Secondary | ICD-10-CM | POA: Diagnosis not present

## 2023-11-06 DIAGNOSIS — K219 Gastro-esophageal reflux disease without esophagitis: Secondary | ICD-10-CM | POA: Diagnosis present

## 2023-11-06 DIAGNOSIS — Z8619 Personal history of other infectious and parasitic diseases: Secondary | ICD-10-CM

## 2023-11-06 DIAGNOSIS — Z1152 Encounter for screening for COVID-19: Secondary | ICD-10-CM | POA: Diagnosis not present

## 2023-11-06 DIAGNOSIS — C7951 Secondary malignant neoplasm of bone: Secondary | ICD-10-CM | POA: Diagnosis not present

## 2023-11-06 DIAGNOSIS — F331 Major depressive disorder, recurrent, moderate: Secondary | ICD-10-CM | POA: Diagnosis not present

## 2023-11-06 DIAGNOSIS — K5903 Drug induced constipation: Secondary | ICD-10-CM | POA: Diagnosis not present

## 2023-11-06 DIAGNOSIS — Z7401 Bed confinement status: Secondary | ICD-10-CM

## 2023-11-06 DIAGNOSIS — F32A Depression, unspecified: Secondary | ICD-10-CM | POA: Diagnosis present

## 2023-11-06 DIAGNOSIS — C787 Secondary malignant neoplasm of liver and intrahepatic bile duct: Secondary | ICD-10-CM | POA: Diagnosis not present

## 2023-11-06 DIAGNOSIS — R4589 Other symptoms and signs involving emotional state: Secondary | ICD-10-CM

## 2023-11-06 DIAGNOSIS — Z8719 Personal history of other diseases of the digestive system: Secondary | ICD-10-CM

## 2023-11-06 DIAGNOSIS — Z66 Do not resuscitate: Secondary | ICD-10-CM | POA: Diagnosis not present

## 2023-11-06 DIAGNOSIS — C189 Malignant neoplasm of colon, unspecified: Secondary | ICD-10-CM | POA: Diagnosis present

## 2023-11-06 DIAGNOSIS — Z6821 Body mass index (BMI) 21.0-21.9, adult: Secondary | ICD-10-CM

## 2023-11-06 DIAGNOSIS — M7989 Other specified soft tissue disorders: Secondary | ICD-10-CM

## 2023-11-06 DIAGNOSIS — F1011 Alcohol abuse, in remission: Secondary | ICD-10-CM | POA: Diagnosis present

## 2023-11-06 DIAGNOSIS — Z5982 Transportation insecurity: Secondary | ICD-10-CM

## 2023-11-06 DIAGNOSIS — C7801 Secondary malignant neoplasm of right lung: Secondary | ICD-10-CM | POA: Diagnosis present

## 2023-11-06 DIAGNOSIS — C7802 Secondary malignant neoplasm of left lung: Secondary | ICD-10-CM | POA: Diagnosis present

## 2023-11-06 DIAGNOSIS — Z634 Disappearance and death of family member: Secondary | ICD-10-CM

## 2023-11-06 DIAGNOSIS — I10 Essential (primary) hypertension: Secondary | ICD-10-CM | POA: Diagnosis not present

## 2023-11-06 DIAGNOSIS — Z515 Encounter for palliative care: Secondary | ICD-10-CM | POA: Diagnosis not present

## 2023-11-06 DIAGNOSIS — E440 Moderate protein-calorie malnutrition: Secondary | ICD-10-CM | POA: Insufficient documentation

## 2023-11-06 DIAGNOSIS — L89322 Pressure ulcer of left buttock, stage 2: Secondary | ICD-10-CM | POA: Diagnosis not present

## 2023-11-06 DIAGNOSIS — R0989 Other specified symptoms and signs involving the circulatory and respiratory systems: Secondary | ICD-10-CM | POA: Diagnosis not present

## 2023-11-06 DIAGNOSIS — Z9049 Acquired absence of other specified parts of digestive tract: Secondary | ICD-10-CM

## 2023-11-06 DIAGNOSIS — E43 Unspecified severe protein-calorie malnutrition: Secondary | ICD-10-CM | POA: Diagnosis present

## 2023-11-06 DIAGNOSIS — R079 Chest pain, unspecified: Secondary | ICD-10-CM | POA: Diagnosis not present

## 2023-11-06 DIAGNOSIS — C7800 Secondary malignant neoplasm of unspecified lung: Secondary | ICD-10-CM | POA: Diagnosis not present

## 2023-11-06 DIAGNOSIS — K2100 Gastro-esophageal reflux disease with esophagitis, without bleeding: Secondary | ICD-10-CM | POA: Diagnosis not present

## 2023-11-06 DIAGNOSIS — G40909 Epilepsy, unspecified, not intractable, without status epilepticus: Secondary | ICD-10-CM | POA: Diagnosis present

## 2023-11-06 DIAGNOSIS — R531 Weakness: Secondary | ICD-10-CM

## 2023-11-06 DIAGNOSIS — R7989 Other specified abnormal findings of blood chemistry: Secondary | ICD-10-CM | POA: Diagnosis not present

## 2023-11-06 DIAGNOSIS — D696 Thrombocytopenia, unspecified: Secondary | ICD-10-CM | POA: Diagnosis not present

## 2023-11-06 DIAGNOSIS — I959 Hypotension, unspecified: Secondary | ICD-10-CM | POA: Diagnosis not present

## 2023-11-06 DIAGNOSIS — Z7189 Other specified counseling: Secondary | ICD-10-CM

## 2023-11-06 DIAGNOSIS — R64 Cachexia: Secondary | ICD-10-CM | POA: Diagnosis present

## 2023-11-06 DIAGNOSIS — Z452 Encounter for adjustment and management of vascular access device: Secondary | ICD-10-CM | POA: Diagnosis not present

## 2023-11-06 DIAGNOSIS — R5381 Other malaise: Secondary | ICD-10-CM | POA: Diagnosis present

## 2023-11-06 DIAGNOSIS — M4807 Spinal stenosis, lumbosacral region: Secondary | ICD-10-CM | POA: Diagnosis not present

## 2023-11-06 DIAGNOSIS — D638 Anemia in other chronic diseases classified elsewhere: Secondary | ICD-10-CM | POA: Diagnosis present

## 2023-11-06 DIAGNOSIS — G893 Neoplasm related pain (acute) (chronic): Principal | ICD-10-CM | POA: Diagnosis present

## 2023-11-06 DIAGNOSIS — L89154 Pressure ulcer of sacral region, stage 4: Secondary | ICD-10-CM | POA: Diagnosis not present

## 2023-11-06 DIAGNOSIS — E876 Hypokalemia: Secondary | ICD-10-CM | POA: Diagnosis not present

## 2023-11-06 DIAGNOSIS — L89150 Pressure ulcer of sacral region, unstageable: Secondary | ICD-10-CM | POA: Diagnosis not present

## 2023-11-06 DIAGNOSIS — Z923 Personal history of irradiation: Secondary | ICD-10-CM

## 2023-11-06 DIAGNOSIS — Z95828 Presence of other vascular implants and grafts: Secondary | ICD-10-CM

## 2023-11-06 DIAGNOSIS — Z91040 Latex allergy status: Secondary | ICD-10-CM

## 2023-11-06 DIAGNOSIS — E860 Dehydration: Secondary | ICD-10-CM | POA: Diagnosis present

## 2023-11-06 DIAGNOSIS — Z599 Problem related to housing and economic circumstances, unspecified: Secondary | ICD-10-CM

## 2023-11-06 DIAGNOSIS — L8990 Pressure ulcer of unspecified site, unspecified stage: Secondary | ICD-10-CM | POA: Insufficient documentation

## 2023-11-06 DIAGNOSIS — M549 Dorsalgia, unspecified: Secondary | ICD-10-CM | POA: Diagnosis not present

## 2023-11-06 DIAGNOSIS — C182 Malignant neoplasm of ascending colon: Secondary | ICD-10-CM | POA: Diagnosis not present

## 2023-11-06 DIAGNOSIS — M8448XA Pathological fracture, other site, initial encounter for fracture: Secondary | ICD-10-CM | POA: Diagnosis not present

## 2023-11-06 DIAGNOSIS — R269 Unspecified abnormalities of gait and mobility: Secondary | ICD-10-CM | POA: Diagnosis present

## 2023-11-06 DIAGNOSIS — R0902 Hypoxemia: Secondary | ICD-10-CM | POA: Diagnosis not present

## 2023-11-06 DIAGNOSIS — R945 Abnormal results of liver function studies: Secondary | ICD-10-CM | POA: Diagnosis not present

## 2023-11-06 DIAGNOSIS — C3490 Malignant neoplasm of unspecified part of unspecified bronchus or lung: Secondary | ICD-10-CM | POA: Diagnosis not present

## 2023-11-06 DIAGNOSIS — R634 Abnormal weight loss: Secondary | ICD-10-CM | POA: Diagnosis not present

## 2023-11-06 DIAGNOSIS — R918 Other nonspecific abnormal finding of lung field: Secondary | ICD-10-CM | POA: Diagnosis not present

## 2023-11-06 DIAGNOSIS — G4089 Other seizures: Secondary | ICD-10-CM | POA: Diagnosis not present

## 2023-11-06 DIAGNOSIS — R630 Anorexia: Secondary | ICD-10-CM

## 2023-11-06 DIAGNOSIS — Z79899 Other long term (current) drug therapy: Secondary | ICD-10-CM

## 2023-11-06 DIAGNOSIS — R7401 Elevation of levels of liver transaminase levels: Secondary | ICD-10-CM | POA: Diagnosis present

## 2023-11-06 DIAGNOSIS — Z9221 Personal history of antineoplastic chemotherapy: Secondary | ICD-10-CM

## 2023-11-06 DIAGNOSIS — Z510 Encounter for antineoplastic radiation therapy: Secondary | ICD-10-CM | POA: Diagnosis not present

## 2023-11-06 DIAGNOSIS — D72829 Elevated white blood cell count, unspecified: Secondary | ICD-10-CM | POA: Diagnosis present

## 2023-11-06 DIAGNOSIS — Z8249 Family history of ischemic heart disease and other diseases of the circulatory system: Secondary | ICD-10-CM

## 2023-11-06 DIAGNOSIS — Z85038 Personal history of other malignant neoplasm of large intestine: Secondary | ICD-10-CM

## 2023-11-06 DIAGNOSIS — L89300 Pressure ulcer of unspecified buttock, unstageable: Secondary | ICD-10-CM | POA: Diagnosis not present

## 2023-11-06 DIAGNOSIS — Z885 Allergy status to narcotic agent status: Secondary | ICD-10-CM

## 2023-11-06 DIAGNOSIS — Z87440 Personal history of urinary (tract) infections: Secondary | ICD-10-CM

## 2023-11-06 DIAGNOSIS — Z8042 Family history of malignant neoplasm of prostate: Secondary | ICD-10-CM

## 2023-11-06 DIAGNOSIS — M4856XA Collapsed vertebra, not elsewhere classified, lumbar region, initial encounter for fracture: Secondary | ICD-10-CM | POA: Diagnosis not present

## 2023-11-06 DIAGNOSIS — I159 Secondary hypertension, unspecified: Secondary | ICD-10-CM | POA: Diagnosis not present

## 2023-11-06 DIAGNOSIS — J9811 Atelectasis: Secondary | ICD-10-CM | POA: Diagnosis not present

## 2023-11-06 DIAGNOSIS — Z9889 Other specified postprocedural states: Secondary | ICD-10-CM

## 2023-11-06 DIAGNOSIS — R0602 Shortness of breath: Secondary | ICD-10-CM | POA: Diagnosis not present

## 2023-11-06 LAB — TROPONIN I (HIGH SENSITIVITY)
Troponin I (High Sensitivity): 5 ng/L (ref <18)
Troponin I (High Sensitivity): 5 ng/L (ref <18)

## 2023-11-06 LAB — HEPATIC FUNCTION PANEL
ALT: 96 U/L — ABNORMAL HIGH (ref 0–44)
AST: 138 U/L — ABNORMAL HIGH (ref 15–41)
Albumin: 2.6 g/dL — ABNORMAL LOW (ref 3.5–5.0)
Alkaline Phosphatase: 956 U/L — ABNORMAL HIGH (ref 38–126)
Bilirubin, Direct: 0.1 mg/dL (ref 0.0–0.2)
Indirect Bilirubin: 0.7 mg/dL (ref 0.3–0.9)
Total Bilirubin: 0.8 mg/dL (ref 0.0–1.2)
Total Protein: 8.1 g/dL (ref 6.5–8.1)

## 2023-11-06 LAB — CBC WITH DIFFERENTIAL/PLATELET
Abs Immature Granulocytes: 1.28 10*3/uL — ABNORMAL HIGH (ref 0.00–0.07)
Basophils Absolute: 0.1 10*3/uL (ref 0.0–0.1)
Basophils Relative: 1 %
Eosinophils Absolute: 0.1 10*3/uL (ref 0.0–0.5)
Eosinophils Relative: 1 %
HCT: 30 % — ABNORMAL LOW (ref 39.0–52.0)
Hemoglobin: 8.9 g/dL — ABNORMAL LOW (ref 13.0–17.0)
Immature Granulocytes: 8 %
Lymphocytes Relative: 16 %
Lymphs Abs: 2.6 10*3/uL (ref 0.7–4.0)
MCH: 25.8 pg — ABNORMAL LOW (ref 26.0–34.0)
MCHC: 29.7 g/dL — ABNORMAL LOW (ref 30.0–36.0)
MCV: 87 fL (ref 80.0–100.0)
Monocytes Absolute: 0.6 10*3/uL (ref 0.1–1.0)
Monocytes Relative: 4 %
Neutro Abs: 11.6 10*3/uL — ABNORMAL HIGH (ref 1.7–7.7)
Neutrophils Relative %: 70 %
Platelets: 231 10*3/uL (ref 150–400)
RBC: 3.45 MIL/uL — ABNORMAL LOW (ref 4.22–5.81)
RDW: 18 % — ABNORMAL HIGH (ref 11.5–15.5)
WBC: 16.2 10*3/uL — ABNORMAL HIGH (ref 4.0–10.5)
nRBC: 0.2 % (ref 0.0–0.2)

## 2023-11-06 LAB — BASIC METABOLIC PANEL
Anion gap: 11 (ref 5–15)
BUN: 14 mg/dL (ref 6–20)
CO2: 20 mmol/L — ABNORMAL LOW (ref 22–32)
Calcium: 8.8 mg/dL — ABNORMAL LOW (ref 8.9–10.3)
Chloride: 104 mmol/L (ref 98–111)
Creatinine, Ser: 0.53 mg/dL — ABNORMAL LOW (ref 0.61–1.24)
GFR, Estimated: >60 mL/min (ref >60)
Glucose, Bld: 91 mg/dL (ref 70–99)
Potassium: 3.4 mmol/L — ABNORMAL LOW (ref 3.5–5.1)
Sodium: 135 mmol/L (ref 135–145)

## 2023-11-06 LAB — URINALYSIS, ROUTINE W REFLEX MICROSCOPIC
Bacteria, UA: NONE SEEN
Bilirubin Urine: NEGATIVE
Glucose, UA: NEGATIVE mg/dL
Ketones, ur: NEGATIVE mg/dL
Leukocytes,Ua: NEGATIVE
Nitrite: NEGATIVE
Protein, ur: 30 mg/dL — AB
Specific Gravity, Urine: >1.046 — ABNORMAL HIGH (ref 1.005–1.030)
pH: 5 (ref 5.0–8.0)

## 2023-11-06 LAB — RESP PANEL BY RT-PCR (RSV, FLU A&B, COVID)  RVPGX2
Influenza A by PCR: NEGATIVE
Influenza B by PCR: NEGATIVE
Resp Syncytial Virus by PCR: NEGATIVE
SARS Coronavirus 2 by RT PCR: NEGATIVE

## 2023-11-06 LAB — POC OCCULT BLOOD, ED: Fecal Occult Bld: NEGATIVE

## 2023-11-06 MED ORDER — OXYCODONE-ACETAMINOPHEN 5-325 MG PO TABS: 1.0000 | ORAL_TABLET | Freq: Once | ORAL | Status: DC

## 2023-11-06 MED ORDER — IOHEXOL 300 MG/ML  SOLN
100.0000 mL | Freq: Once | INTRAMUSCULAR | Status: AC | PRN
  Administered 2023-11-06: 100 mL via INTRAVENOUS

## 2023-11-06 MED ORDER — FENTANYL 50 MCG/HR TD PT72
1.0000 | MEDICATED_PATCH | TRANSDERMAL | Status: DC
  Administered 2023-11-07: 1 via TRANSDERMAL
  Filled 2023-11-06 (×2): qty 1

## 2023-11-06 MED ORDER — HYDROMORPHONE HCL 1 MG/ML IJ SOLN: 1.0000 mg | Freq: Once | INTRAMUSCULAR | Status: DC

## 2023-11-06 MED ORDER — HYDROMORPHONE HCL 1 MG/ML IJ SOLN
2.0000 mg | Freq: Once | INTRAMUSCULAR | Status: AC
  Administered 2023-11-06: 2 mg via INTRAVENOUS
  Filled 2023-11-06: qty 2

## 2023-11-06 MED ORDER — HYDROMORPHONE HCL 1 MG/ML IJ SOLN
1.0000 mg | Freq: Once | INTRAMUSCULAR | Status: AC
  Administered 2023-11-06: 1 mg via INTRAVENOUS
  Filled 2023-11-06: qty 1

## 2023-11-06 MED ORDER — LACTATED RINGERS IV BOLUS
1000.0000 mL | Freq: Once | INTRAVENOUS | Status: AC
  Administered 2023-11-06: 1000 mL via INTRAVENOUS

## 2023-11-06 NOTE — ED Provider Notes (Signed)
  EMERGENCY DEPARTMENT AT [facility] Provider Note   CSN: [id number] Arrival date & time: 11/06/23  1417     History  Chief Complaint  Patient presents with   Shortness of Breath   Weakness    Joseph Haney is a 48 y.o. male with past medical history of of colon cancer (s/p partial colectomy 2022), liver and pelvic bone metastasis presents to the emergency department for evaluation of shortness of breath and intermittent chest pain, weakness, decreased appetite for the past 2 weeks.  He sought ED evaluation today as he has had constant chest pain, abdominal pain, back pain over the past 3 days.  Chest pain and shortness of breath worsened with exertion.  He has had a 70 pound weight loss over the past 10 months. He reports that he typically eats once a day at most and takes Megestrol for appetite stimulant without improvement. Last bowel movement was this morning and normal. No vomiting diarrhea, fever, hx of clot, no hemopysis, no pedal edema, nor recent travel.  He was seen on 10/22/2023 by his oncologist who recommended him to restart chemotherapy however patient has been too weak to tolerate it and has since suggested hospice care.  He had 2 completed cycles with his third 1 canceled due to a death in the family.  He has not returned to obtain chemo since 07/27/2023. Oncologist recommended hospice care. He is followed by pain management who provide him oxycodone 15mg  and fentaynl patches for pain without relief  Of note, his wife recently passed away 6 months ago from bilateral PNA.   Shortness of Breath Associated symptoms: no abdominal pain, no chest pain, no cough, no fever, no headaches, no vomiting and no wheezing   Weakness Associated symptoms: shortness of breath   Associated symptoms: no abdominal pain, no chest pain, no cough, no diarrhea, no dizziness, no fever, no headaches, no nausea, no seizures and no vomiting        Home Medications Prior to  Admission medications   Medication Sig Start Date End Date Taking? Authorizing Provider  acetaminophen (TYLENOL 8 HOUR) 650 MG CR tablet Take 1 tablet (650 mg total) by mouth every 8 (eight) hours as needed for pain. 08/17/23   Malachy Mood, MD  capecitabine (XELODA) 500 MG tablet Take 3 tablets (1,500 mg total) by mouth 2 (two) times daily after a meal. Take for 14 days on, then 7 days off. Repeat every 21 days. Start with IV chemo 06/15/23   Malachy Mood, MD  carvedilol (COREG) 25 MG tablet Take 1 tablet (25 mg total) by mouth 2 (two) times daily with a meal. 01/15/22   Malachy Mood, MD  cyclobenzaprine (FLEXERIL) 10 MG tablet Take 10-20 mg by mouth 2 (two) times daily as needed for muscle spasms. 06/25/21   [provider]  dexamethasone (DECADRON) 4 MG tablet Take 1 tablet (4 mg total) by mouth daily. Take for 3-5 days after chemo for nausea and fatigue 08/17/23   Malachy Mood, MD  dexamethasone (DECADRON) 4 MG tablet Take 2 tablets (8 mg total) by mouth daily. 10/22/23   Malachy Mood, MD  diphenoxylate-atropine (LOMOTIL) 2.5-0.025 MG tablet Take 1 tablet by mouth 4 (four) times daily as needed for diarrhea or loose stools. Patient not taking: Reported on 01/07/2023 12/02/22   Pollyann Samples, NP  escitalopram (LEXAPRO) 10 MG tablet Take 10 mg by mouth daily. 04/29/22   [provider]  ferrous sulfate 325 (65 FE) MG tablet Take 1  tablet (325 mg total) by mouth 2 (two) times daily with a meal. Patient not taking: Reported on 01/07/2023 08/01/21   [name], Rebekah Chesterfield, MD  gabapentin (NEURONTIN) 300 MG capsule Take 300 mg by mouth 3 (three) times daily.    [provider]  HYDROcodone-acetaminophen (NORCO/VICODIN) 5-325 MG tablet Take 1 tablet by mouth every 6 (six) hours as needed for moderate pain.    [provider]  lidocaine-prilocaine (EMLA) cream Apply to affected area once 07/24/23   Malachy Mood, MD  magic mouthwash (nystatin, lidocaine, diphenhydrAMINE, alum & mag hydroxide)  suspension Take 5 mLs by mouth 3 (three) times daily as needed for mouth pain. Patient not taking: Reported on 01/07/2023 12/02/22   Pollyann Samples, NP  megestrol (MEGACE) 400 MG/10ML suspension Take 10 mLs (400 mg total) by mouth daily. 10/27/23   Carlean Jews, NP  ondansetron (ZOFRAN) 8 MG tablet Take 1 tablet (8 mg total) by mouth every 8 (eight) hours as needed for nausea or vomiting. Start on the third day after chemotherapy. 10/22/23   Malachy Mood, MD  oxyCODONE (ROXICODONE) 5 MG immediate release tablet Take 1 tablet (5 mg total) by mouth every 6 (six) hours as needed for severe pain. Patient not taking: Reported on 01/07/2023 04/03/22   Darrick Grinder, [name]  Oxycodone HCl 10 MG TABS Take 10 mg by mouth every 6 (six) hours as needed.    [provider]  polyethylene glycol (MIRALAX / GLYCOLAX) 17 g packet Take 17 g by mouth daily as needed for mild constipation or moderate constipation. Patient not taking: Reported on 01/07/2023 08/01/21   [name], Rebekah Chesterfield, MD  potassium chloride SA (KLOR-CON M) 20 MEQ tablet Take 1 tablet (20 mEq total) by mouth daily for 5 days. 04/22/23 04/27/23  Malachy Mood, MD  prochlorperazine (COMPAZINE) 10 MG tablet Take 1 tablet (10 mg total) by mouth every 6 (six) hours as needed for nausea or vomiting. 07/24/23   Malachy Mood, MD  promethazine (PHENERGAN) 25 MG tablet Take 1 tablet (25 mg total) by mouth every 6 (six) hours as needed for nausea or vomiting. 05/26/23   Malachy Mood, MD      Allergies    Latex and Morphine and codeine    Review of Systems   Review of Systems  Constitutional:  Negative for chills, fatigue and fever.  Respiratory:  Positive for shortness of breath. Negative for cough, chest tightness and wheezing.   Cardiovascular:  Negative for chest pain and palpitations.  Gastrointestinal:  Negative for abdominal pain, constipation, diarrhea, nausea and vomiting.  Neurological:  Positive for weakness. Negative for dizziness, seizures, light-headedness,  numbness and headaches.    Physical Exam Updated Vital Signs BP (!) 151/92   Pulse (!) 111   Temp 97.9 F (36.6 C) (Oral)   Resp 19   Ht 5\' 4"  (1.626 m)   Wt 56.2 kg   SpO2 100%   BMI 21.28 kg/m  Physical Exam Vitals and nursing note reviewed.  Constitutional:      General: He is not in acute distress.    Appearance: Normal appearance.     Comments: Chronically ill appearing with clothes that are oversized  HENT:     Head: Normocephalic and atraumatic.  Eyes:     Conjunctiva/sclera: Conjunctivae normal.  Cardiovascular:     Rate and Rhythm: Tachycardia present.     Pulses: Normal pulses.     Comments: 108-117bpm likely 2/2 to pain and dehydration Pulmonary:     Effort: Pulmonary  effort is normal. No respiratory distress.     Breath sounds: Normal breath sounds.  Chest:     Chest wall: No tenderness.  Abdominal:     General: There is no distension.     Palpations: Abdomen is soft.     Tenderness: There is generalized abdominal tenderness. There is no right CVA tenderness, left CVA tenderness, guarding or rebound.  Musculoskeletal:     Cervical back: Normal range of motion and neck supple. No rigidity or tenderness.     Right lower leg: No edema.     Left lower leg: No edema.  Skin:    Capillary Refill: Capillary refill takes less than 2 seconds.     Coloration: Skin is not jaundiced or pale.  Neurological:     Mental Status: He is alert and oriented to person, place, and time. Mental status is at baseline.     ED Results / Procedures / Treatments   Labs (all labs ordered are listed, but only abnormal results are displayed) Labs Reviewed  BASIC METABOLIC PANEL - Abnormal; Notable for the following components:      Result Value   Potassium 3.4 (*)    CO2 20 (*)    Creatinine, Ser 0.53 (*)    Calcium 8.8 (*)    All other components within normal limits  CBC WITH DIFFERENTIAL/PLATELET - Abnormal; Notable for the following components:   WBC 16.2 (*)    RBC  3.45 (*)    Hemoglobin 8.9 (*)    HCT 30.0 (*)    MCH 25.8 (*)    MCHC 29.7 (*)    RDW 18.0 (*)    Neutro Abs 11.6 (*)    Abs Immature Granulocytes 1.28 (*)    All other components within normal limits  URINALYSIS, ROUTINE W REFLEX MICROSCOPIC - Abnormal; Notable for the following components:   Specific Gravity, Urine >1.046 (*)    Hgb urine dipstick SMALL (*)    Protein, ur 30 (*)    All other components within normal limits  HEPATIC FUNCTION PANEL - Abnormal; Notable for the following components:   Albumin 2.6 (*)    AST 138 (*)    ALT 96 (*)    Alkaline Phosphatase 956 (*)    All other components within normal limits  RESP PANEL BY RT-PCR (RSV, FLU A&B, COVID)  [id number]  POC OCCULT BLOOD, ED  TROPONIN I (HIGH SENSITIVITY)  TROPONIN I (HIGH SENSITIVITY)    EKG None  Radiology DG Chest 2 View Result Date: 11/06/2023 CLINICAL DATA:  Shortness of breath.  Weakness. EXAM: CHEST - 2 VIEW COMPARISON:  Subsequent chest CT, available at time of radiograph interpretation. FINDINGS: Right chest port in place. Lung volumes are low.The cardiomediastinal contours are normal. Right upper lobe nodule on CT is not well seen by radiograph. The additional small pulmonary nodules are also not seen. Pulmonary vasculature is normal. Subsegmental atelectasis at the bases. No confluent consolidation, pleural effusion, or pneumothorax. No acute osseous abnormalities are seen. IMPRESSION: 1. Pulmonary nodules on subsequent CT are not well demonstrated by radiograph. 2. Subsegmental atelectasis at the lung bases. Electronically Signed   By: Narda Rutherford M.D.   On: 11/06/2023 18:37   CT CHEST ABDOMEN PELVIS W CONTRAST Result Date: 11/06/2023 CLINICAL DATA:  Chest pain, abdominal pain, metastatic colon cancer * Tracking Code: BO * EXAM: CT CHEST, ABDOMEN, AND PELVIS WITH CONTRAST TECHNIQUE: Multidetector CT imaging of the chest, abdomen and pelvis was performed following the standard protocol during bolus  administration of intravenous contrast. RADIATION DOSE REDUCTION: This exam was performed according to the departmental dose-optimization program which includes automated exposure control, adjustment of the mA and/or kV according to patient size and/or use of iterative reconstruction technique. CONTRAST:  OMNIPAQUE IOHEXOL 300 MG/ML  SOLN COMPARISON:  PET-CT, 06/29/2023 FINDINGS: CT CHEST FINDINGS Cardiovascular: Right chest port catheter. Normal heart size. Left coronary artery calcifications. No pericardial effusion. Mediastinum/Nodes: No enlarged mediastinal, hilar, or axillary lymph nodes. Thyroid gland, trachea, and esophagus demonstrate no significant findings. Lungs/Pleura: Interval enlargement and increased in solid character of a dominant spiculated nodule of the right upper lobe measuring 1.1 x 1.0 cm, previously 0.8 x 0.7 cm when measured similarly (series 6, image 26). Numerous additional small nodules, the majority very tiny, some of the larger nodules seen on prior examination slightly increased in size, for example in the medial left upper lobe measuring 0.5 cm, previously 0.2 cm (series 6, image 56). Bandlike scarring of the middle lobe and lingula. No pleural effusion or pneumothorax. Musculoskeletal: No chest wall abnormality. No acute osseous findings. CT ABDOMEN PELVIS FINDINGS Hepatobiliary: Interval enlargement of a hypodense metastasis of the medial liver dome, hepatic segment IV a, measuring 5.2 x 4.3 cm, previously 3.2 x 3.1 cm. Multiple additional new small satellite lesions. No gallstones, gallbladder wall thickening, or biliary dilatation. Pancreas: Unremarkable. No pancreatic ductal dilatation or surrounding inflammatory changes. Spleen: Normal in size without significant abnormality. Adrenals/Urinary Tract: Adrenal glands are unremarkable. Kidneys are normal, without renal calculi, solid lesion, or hydronephrosis. Bladder is unremarkable. Stomach/Bowel: Stomach is within normal  limits. Status post partial right hemicolectomy. No evidence of bowel wall thickening, distention, or inflammatory changes. Vascular/Lymphatic: Aortic atherosclerosis. No enlarged abdominal or pelvic lymph nodes. Reproductive: No mass or other abnormality. Other: No abdominal wall hernia or abnormality. No ascites. Musculoskeletal: No acute osseous findings. Diffusely heterogeneous mixed lytic and sclerotic osseous metastatic disease throughout the axial skeleton, significantly increased in conspicuity compared to prior examination. IMPRESSION: 1. New and enlarged pulmonary nodules. 2. Interval enlargement of a hypodense metastasis of the medial liver dome with multiple new small satellite metastases. 3. Diffusely heterogeneous mixed lytic and sclerotic osseous metastatic disease throughout the axial skeleton, significantly increased in conspicuity compared to prior examination. 4. Constellation of findings is consistent with worsened, widespread metastatic disease. 5. Status post partial right hemicolectomy. 6. Coronary artery disease. Aortic Atherosclerosis (ICD10-I70.0). Electronically Signed   By: Jearld Lesch M.D.   On: 11/06/2023 17:34    Procedures Procedures    Medications Ordered in ED Medications  HYDROmorphone (DILAUDID) injection 1 mg (1 mg Intravenous Given 11/06/23 1720)  iohexol (OMNIPAQUE) 300 MG/ML solution 100 mL (100 mLs Intravenous Contrast Given 11/06/23 1712)  lactated ringers bolus 1,000 mL (0 mLs Intravenous Stopped 11/06/23 2239)  HYDROmorphone (DILAUDID) injection 2 mg (2 mg Intravenous Given 11/06/23 2007)  HYDROmorphone (DILAUDID) injection 2 mg (2 mg Intravenous Given 11/06/23 2235)    ED Course/ Medical Decision Making/ A&P Clinical Course as of 11/06/23 2253  Fri Nov 06, 2023  1555 Hemoglobin(!): 8.9 9-13.3 over past 10 months. Most recently 11.5 three months ago [LB]    Clinical Course User Index [LB] Judithann Sheen, PA                                 Medical  Decision Making Amount and/or Complexity of Data Reviewed Labs: ordered. Decision-making details documented in ED Course. Radiology: ordered.  Risk  Prescription drug management. Decision regarding hospitalization.   Patient presents to the ED for concern of CP, abd pain, shob, decreased appetite, this involves an extensive number of treatment options, and is a complaint that carries with it a high risk of complications and morbidity.  The differential diagnosis includes cancer leg pain, ACS, intra-abdominal pathology, pneumonia, cancer metastasis   Co morbidities that complicate the patient evaluation  Colon cancer with metastasis to liver, bone, lungs   Additional history obtained:  Additional history obtained from Nursing and Outside Medical Records   External records from outside source obtained and reviewed including  Triage RN note Oncology note from 10/22/2023 and yesterday   Lab Tests:  I Ordered, and personally interpreted labs.  The pertinent results include:   Leukocytosis of 16.2 AST 138 ALT 96 ALP 956 Hemoglobin 8.9.  Fecal occult negative Troponin 5 UA notable for dehydration without infection Respiratory panel negative   Imaging Studies ordered:  I ordered imaging studies including CT chest abdomen pelvis with contrast I independently visualized and interpreted imaging which showed  New and enlarged pulmonary nodules. Interval enlargement of a hypodense metastasis of the medial liver dome with multiple new small satellite metastases. Diffusely heterogeneous mixed lytic and sclerotic osseous metastatic disease throughout the axial skeleton, significantly increased in conspicuity compared to prior examination. Constellation of findings is consistent with worsened, widespread metastatic disease. Status post partial right hemicolectomy. Coronary artery disease. I agree with the radiologist interpretation   Cardiac Monitoring:  The patient was  maintained on a cardiac monitor.  I personally viewed and interpreted the cardiac monitored which showed an underlying rhythm of: Sinus tachycardia with no ST nor T wave abnormalities.  Similar to tachycardia on previous EKGs   Medicines ordered and prescription drug management:  I ordered medication including dilaudid and IVF for pain and dehydration Reevaluation of the patient after these medicines showed that the patient stayed the same I have reviewed the patients home medicines and have made adjustments as needed    Consultations Obtained:  I requested consultation with the hospitalist,  and discussed lab and imaging findings as well as pertinent plan - Dr. 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  He has elevated transaminase from prior and likely due to liver mets.  Alk phos significantly elevated from prior.  UA shows dehydration without infection.  Respiratory panel negative. CP, abd pain likely related to chronic cancer pain CT as noted in depth above notes worsening cancer as he is not currently being treated Will provide 1L IVF for dehydration Chronic pain is currently not improving with his oxycodone 15 mg and fentanyl patches at home provided by pain management Patient's pain improved following 3 doses of Dilaudid here in ED.  Will admit pt for intractable pain   Reevaluation:  After the interventions noted above, I reevaluated the patient and found that they have :stayed the same    Dispostion:  After consideration of the diagnostic results and the patients response to treatment, I feel that the patent would benefit from admission for intractable pain 2/2 cancer.    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

## 2023-11-06 NOTE — ED Triage Notes (Signed)
 Pt BIB EMS coming from home, c/o of generalized weakness and shob x2 weeks. Unable to keep an appetite. Feeling pressure on chest. States having lost weight in the past couple of months. Hx: of liver cancer. Last chemo treatment was 1 month ago. Pt lost wife 4 months ago.  EMS: BP 130/60, HR 110, Spo2 98%, Given aspirin en route

## 2023-11-06 NOTE — H&P (Signed)
 History and Physical    Patient: Joseph Haney UJW:[id number] DOB: Aug 31, 1976 DOA: 11/06/2023 DOS: the patient was seen and examined on 11/06/2023 PCP: Frederic Jericho, [name]  Patient coming from: Home  Chief Complaint:  Chief Complaint  Patient presents with   Shortness of Breath   Weakness   HPI: Joseph Haney is a 48 y.o. 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  Patient was on CAPOX, also Xtandi which was discontinued due to nausea vomiting and headaches.  Patient was on Xeloda and finished in June 2023.  He was seen at [facility] and started on neoadjuvant chemotherapy and possible surgical resection.  He started chemo in February last year with the FOLFIRI but then patient has not completed his chemo.  He has not follow-up on chemotherapy that was started in September he has had issues and has been canceling it.  At that time he was supposed to be on CAPOX.  Since then he has been lost to follow-up and has not been taking any medications.  Patient came to the ED today with significant pain.  He is not getting relief.  He is on oxycodone at home.  It is not relieving his pain.  He is also having some nausea.  Patient also reported fatigue and anorexia.  In the ER workup so far is uneventful.  Due to uncontrolled pain patient is being admitted to the hospital for treatment of cancer related pain.  Review of Systems: As mentioned in the history of present illness. All other systems reviewed and are negative. Past Medical History:  Diagnosis Date   Alcohol abuse    Cancer (HCC)    GERD (gastroesophageal reflux disease)    Seizure [facility])    Past Surgical History:  Procedure Laterality Date   BIOPSY  07/22/2021   Procedure: BIOPSY;  Surgeon: Kathi Der, MD;  Location: [facility]  ENDOSCOPY;  Service: Gastroenterology;;   COLON SURGERY     COLONOSCOPY WITH PROPOFOL N/A 07/22/2021   Procedure: COLONOSCOPY WITH PROPOFOL;  Surgeon: Kathi Der, MD;  Location: [facility] ENDOSCOPY;  Service: Gastroenterology;  Laterality: N/A;   IR IMAGING GUIDED PORT INSERTION  10/17/2022   NO PAST SURGERIES     SUBMUCOSAL TATTOO INJECTION  07/22/2021   Procedure: SUBMUCOSAL TATTOO INJECTION;  Surgeon: Kathi Der, MD;  Location: [facility] ENDOSCOPY;  Service: Gastroenterology;;   Social History:  reports that he has never smoked. He has never used smokeless tobacco. He reports that he does not currently use alcohol. He reports that he does not use drugs.  Allergies  Allergen Reactions   Latex Swelling and Other (See Comments)    Pain and swelling of penis after urinary catheter placed last surgery.   Morphine And Codeine Itching    Family History  Problem Relation Age of Onset   Coronary artery disease Mother    Coronary artery disease Father    Heart attack Father 65   Cancer Cousin 13       prostate    Prior to Admission medications   Medication Sig Start Date End Date Taking? Authorizing Provider  acetaminophen (TYLENOL 8 HOUR) 650 MG CR tablet Take 1 tablet (650 mg total) by mouth every 8 (eight) hours as needed for pain. 08/17/23   Malachy Mood, MD  capecitabine (XELODA) 500 MG tablet Take 3 tablets (1,500  mg total) by mouth 2 (two) times daily after a meal. Take for 14 days on, then 7 days off. Repeat every 21 days. Start with IV chemo 06/15/23   Malachy Mood, MD  carvedilol (COREG) 25 MG tablet Anorexia 1 tablet (25 mg total) by mouth 2 (two) times daily with a meal. 01/15/22   Malachy Mood, MD  cyclobenzaprine (FLEXERIL) 10 MG tablet Take 10-20 mg by mouth 2 (two) times daily as needed for muscle spasms. 06/25/21   [provider]  dexamethasone (DECADRON) 4 MG tablet Take 1 tablet (4 mg total) by mouth daily. Take for 3-5 days after chemo for nausea and fatigue 08/17/23    Malachy Mood, MD  dexamethasone (DECADRON) 4 MG tablet Take 2 tablets (8 mg total) by mouth daily. 10/22/23   Malachy Mood, MD  diphenoxylate-atropine (LOMOTIL) 2.5-0.025 MG tablet Take 1 tablet by mouth 4 (four) times daily as needed for diarrhea or loose stools. Patient not taking: Reported on 01/07/2023 12/02/22   Pollyann Samples, NP  escitalopram (LEXAPRO) 10 MG tablet Take 10 mg by mouth daily. 04/29/22   [provider]  ferrous sulfate 325 (65 FE) MG tablet Take 1 tablet (325 mg total) by mouth 2 (two) times daily with a meal. Patient not taking: Reported on 01/07/2023 08/01/21   [name], Rebekah Chesterfield, MD  gabapentin (NEURONTIN) 300 MG capsule Take 300 mg by mouth 3 (three) times daily.    [provider]  HYDROcodone-acetaminophen (NORCO/VICODIN) 5-325 MG tablet Take 1 tablet by mouth every 6 (six) hours as needed for moderate pain.    [provider]  lidocaine-prilocaine (EMLA) cream Apply to affected area once 07/24/23   Malachy Mood, MD  magic mouthwash (nystatin, lidocaine, diphenhydrAMINE, alum & mag hydroxide) suspension Take 5 mLs by mouth 3 (three) times daily as needed for mouth pain. Patient not taking: Reported on 01/07/2023 12/02/22   Pollyann Samples, NP  megestrol (MEGACE) 400 MG/10ML suspension Take 10 mLs (400 mg total) by mouth daily. 10/27/23   Carlean Jews, NP  ondansetron (ZOFRAN) 8 MG tablet Take 1 tablet (8 mg total) by mouth every 8 (eight) hours as needed for nausea or vomiting. Start on the third day after chemotherapy. 10/22/23   Malachy Mood, MD  oxyCODONE (ROXICODONE) 5 MG immediate release tablet Take 1 tablet (5 mg total) by mouth every 6 (six) hours as needed for severe pain. Patient not taking: Reported on 01/07/2023 04/03/22   Darrick Grinder, [name]  Oxycodone HCl 10 MG TABS Take 10 mg by mouth every 6 (six) hours as needed.    [provider]  polyethylene glycol (MIRALAX / GLYCOLAX) 17 g packet Take 17 g by mouth daily as needed for mild constipation  or moderate constipation. Patient not taking: Reported on 01/07/2023 08/01/21   [name], Rebekah Chesterfield, MD  potassium chloride SA (KLOR-CON M) 20 MEQ tablet Take 1 tablet (20 mEq total) by mouth daily for 5 days. 04/22/23 04/27/23  Malachy Mood, MD  prochlorperazine (COMPAZINE) 10 MG tablet Take 1 tablet (10 mg total) by mouth every 6 (six) hours as needed for nausea or vomiting. 07/24/23   Malachy Mood, MD  promethazine (PHENERGAN) 25 MG tablet Take 1 tablet (25 mg total) by mouth every 6 (six) hours as needed for nausea or vomiting. 05/26/23   Malachy Mood, MD    Physical Exam: Vitals:   11/06/23 1450 11/06/23 1745 11/06/23 1834 11/06/23 1930  BP:  135/84  135/89  Pulse:  (!) 108  [name])  117  Resp:  (!) 23  (!) 24  Temp:   98.9 F (37.2 C)   TempSrc:   Oral   SpO2: 100% 100%  100%  Weight: 56.2 kg     Height: 5\' 4"  (1.626 m)      Constitutional: Chronically ill looking, NAD, calm, comfortable Eyes: PERRL, lids and conjunctivae normal ENMT: Mucous membranes are moist. Posterior pharynx clear of any exudate or lesions.Normal dentition.  Neck: normal, supple, no masses, no thyromegaly Respiratory: clear to auscultation bilaterally, no wheezing, no crackles. Normal respiratory effort. No accessory muscle use.  Cardiovascular: Sinus tachycardia, no murmurs / rubs / gallops. No extremity edema. 2+ pedal pulses. No carotid bruits.  Abdomen: no tenderness, no masses palpated. No hepatosplenomegaly. Bowel sounds positive.  Musculoskeletal: Good range of motion, no joint swelling or tenderness, Skin: no rashes, lesions, ulcers. No induration Neurologic: CN 2-12 grossly intact. Sensation intact, DTR normal. Strength 5/5 in all 4.  Psychiatric: Normal judgment and insight. Alert and oriented x 3.  Depressed mood  Data Reviewed:  Temperature 98 blood pressure 150/92, pulse 117 respirate of 24 oxygen 100% room air.  White count 16.2 hemoglobin 8.9.  Potassium 3.4 creatinine 0.53 and CO2 20 calcium 8.8 acute viral  screen is negative urinalysis negative.  Fecal occult blood testing is also negative.  Chest x-ray showed pulmonary nodules CT chest abdomen pelvis showed new enlarged pulmonary nodules.  There is interval enlargement of hypodense metastasis to the liver.  Also multiple lytic and sclerotic metastatic disease throughout the axial skeleton there is constellation of findings consistent with worsening worse.  Metastatic disease  Assessment and Plan:  #1 uncontrolled cancer related pain: Patient has tried multiple medications and not controlled.  There is conversation about possible hospice care but patient and family not interested yet.  They want patient to be seen by oncology.  Patient has tried fentanyl patch for low dose before.  He walked but it is wearing out.  He is currently on oxycodone does not relieving his pain.  He has received Dilaudid in the ER with some minimal relief but has been wearing off quickly.  Discussed pain options with the patient.  Will admit.  Initiate Dilaudid IV for breakthrough pain every 2 hours as needed.  Additionally we will start patient on fentanyl 50 mcg every 72 hours.  Dose can be adjusted since patient cannot tolerate it.  #2 metastatic colon cancer: Will consult Dr. Mosetta Putt patient has not been able to go for his visit but now can be seen here.  #3 hypokalemia: Replete potassium  #4 severe protein calorie malnutrition: Continue diet and nutrition consult.  #5 leukocytosis: Possibly cancer related.  Monitor white count  #6 anemia of chronic disease: Hemoglobin 8.9.  Was 11.5 last year.  Continue to monitor.    Advance Care Planning:   Code Status: Full Code   Consults: Dr. Mosetta Putt, Oncology  Family Communication: Mother at bedside  Severity of Illness: The appropriate patient status for this patient is INPATIENT. Inpatient status is judged to be reasonable and necessary in order to provide the required intensity of service to ensure the patient's safety. The  patient's presenting symptoms, physical exam findings, and initial radiographic and laboratory data in the context of their chronic comorbidities is felt to place them at high risk for further clinical deterioration. Furthermore, it is not anticipated that the patient will be medically stable for discharge from the hospital within 2 midnights of admission.   * I certify that at  the point of admission it is my clinical judgment that the patient will require inpatient hospital care spanning beyond 2 midnights from the point of admission due to high intensity of service, high risk for further deterioration and high frequency of surveillance required.*  AuthorLonia Blood, MD 11/06/2023 10:30 PM  For on call review [email or web address].

## 2023-11-07 DIAGNOSIS — G893 Neoplasm related pain (acute) (chronic): Principal | ICD-10-CM

## 2023-11-07 LAB — COMPREHENSIVE METABOLIC PANEL
ALT: 76 U/L — ABNORMAL HIGH (ref 0–44)
AST: 127 U/L — ABNORMAL HIGH (ref 15–41)
Albumin: 2.5 g/dL — ABNORMAL LOW (ref 3.5–5.0)
Alkaline Phosphatase: 1070 U/L — ABNORMAL HIGH (ref 38–126)
Anion gap: 12 (ref 5–15)
BUN: 11 mg/dL (ref 6–20)
CO2: 21 mmol/L — ABNORMAL LOW (ref 22–32)
Calcium: 8.6 mg/dL — ABNORMAL LOW (ref 8.9–10.3)
Chloride: 103 mmol/L (ref 98–111)
Creatinine, Ser: 0.58 mg/dL — ABNORMAL LOW (ref 0.61–1.24)
GFR, Estimated: >60 mL/min (ref >60)
Glucose, Bld: 117 mg/dL — ABNORMAL HIGH (ref 70–99)
Potassium: 3.9 mmol/L (ref 3.5–5.1)
Sodium: 136 mmol/L (ref 135–145)
Total Bilirubin: 0.7 mg/dL (ref 0.0–1.2)
Total Protein: 7.4 g/dL (ref 6.5–8.1)

## 2023-11-07 LAB — CBC
HCT: 27 % — ABNORMAL LOW (ref 39.0–52.0)
Hemoglobin: 8.2 g/dL — ABNORMAL LOW (ref 13.0–17.0)
MCH: 26.5 pg (ref 26.0–34.0)
MCHC: 30.4 g/dL (ref 30.0–36.0)
MCV: 87.4 fL (ref 80.0–100.0)
Platelets: 209 10*3/uL (ref 150–400)
RBC: 3.09 MIL/uL — ABNORMAL LOW (ref 4.22–5.81)
RDW: 18.1 % — ABNORMAL HIGH (ref 11.5–15.5)
WBC: 14.4 10*3/uL — ABNORMAL HIGH (ref 4.0–10.5)
nRBC: 0.1 % (ref 0.0–0.2)

## 2023-11-07 LAB — GLUCOSE, CAPILLARY: Glucose-Capillary: 283 mg/dL — ABNORMAL HIGH (ref 70–99)

## 2023-11-07 MED ORDER — ONDANSETRON HCL 4 MG/2ML IJ SOLN: 4.0000 mg | Freq: Four times a day (QID) | INTRAMUSCULAR | Status: DC | PRN

## 2023-11-07 MED ORDER — KETOROLAC TROMETHAMINE 15 MG/ML IJ SOLN
15.0000 mg | Freq: Four times a day (QID) | INTRAMUSCULAR | Status: DC
  Administered 2023-11-07: 15 mg via INTRAVENOUS
  Filled 2023-11-07: qty 1

## 2023-11-07 MED ORDER — HYDROMORPHONE 1 MG/ML IV SOLN
INTRAVENOUS | Status: DC
  Administered 2023-11-07: 30 mg via INTRAVENOUS
  Filled 2023-11-07: qty 30

## 2023-11-07 MED ORDER — HYDROMORPHONE HCL 1 MG/ML IJ SOLN
1.0000 mg | INTRAMUSCULAR | Status: DC | PRN
  Administered 2023-11-07 (×4): 1 mg via INTRAVENOUS
  Filled 2023-11-07 (×4): qty 1

## 2023-11-07 MED ORDER — DIPHENHYDRAMINE HCL 50 MG/ML IJ SOLN: 12.5000 mg | Freq: Four times a day (QID) | INTRAMUSCULAR | Status: DC | PRN

## 2023-11-07 MED ORDER — NALOXONE HCL 0.4 MG/ML IJ SOLN: 0.4000 mg | INTRAMUSCULAR | Status: DC | PRN

## 2023-11-07 MED ORDER — KCL-LACTATED RINGERS-D5W 20 MEQ/L IV SOLN
INTRAVENOUS | Status: AC
  Filled 2023-11-07 (×3): qty 1000

## 2023-11-07 MED ORDER — ACETAMINOPHEN 500 MG PO TABS
1000.0000 mg | ORAL_TABLET | Freq: Three times a day (TID) | ORAL | Status: AC
  Administered 2023-11-07 – 2023-11-13 (×15): 1000 mg via ORAL
  Filled 2023-11-07 (×15): qty 2

## 2023-11-07 MED ORDER — ENSURE ENLIVE PO LIQD
237.0000 mL | Freq: Two times a day (BID) | ORAL | Status: DC
  Administered 2023-11-10 – 2023-11-13 (×2): 237 mL via ORAL

## 2023-11-07 MED ORDER — HYDROMORPHONE HCL 1 MG/ML IJ SOLN
2.0000 mg | INTRAMUSCULAR | Status: DC | PRN
  Administered 2023-11-07 (×2): 2 mg via INTRAVENOUS
  Filled 2023-11-07 (×2): qty 2

## 2023-11-07 MED ORDER — POTASSIUM CHLORIDE CRYS ER 20 MEQ PO TBCR
20.0000 meq | EXTENDED_RELEASE_TABLET | Freq: Every day | ORAL | Status: DC
  Administered 2023-11-07 – 2023-11-20 (×14): 20 meq via ORAL
  Filled 2023-11-07 (×15): qty 1

## 2023-11-07 MED ORDER — MEGESTROL ACETATE 400 MG/10ML PO SUSP
400.0000 mg | Freq: Every day | ORAL | Status: DC
  Administered 2023-11-07 – 2023-11-10 (×4): 400 mg via ORAL
  Filled 2023-11-07 (×4): qty 10

## 2023-11-07 MED ORDER — OXYCODONE HCL 5 MG PO TABS
15.0000 mg | ORAL_TABLET | ORAL | Status: DC | PRN
  Administered 2023-11-07: 15 mg via ORAL
  Filled 2023-11-07: qty 3

## 2023-11-07 MED ORDER — DIPHENHYDRAMINE HCL 12.5 MG/5ML PO ELIX: 12.5000 mg | ORAL_SOLUTION | Freq: Four times a day (QID) | ORAL | Status: DC | PRN

## 2023-11-07 MED ORDER — DEXAMETHASONE SODIUM PHOSPHATE 4 MG/ML IJ SOLN
4.0000 mg | INTRAMUSCULAR | Status: DC
Start: 2023-11-07 — End: 2023-11-11
  Administered 2023-11-07 – 2023-11-10 (×4): 4 mg via INTRAVENOUS
  Filled 2023-11-07 (×5): qty 1

## 2023-11-07 MED ORDER — ENOXAPARIN SODIUM 40 MG/0.4ML IJ SOSY
40.0000 mg | PREFILLED_SYRINGE | INTRAMUSCULAR | Status: DC
  Administered 2023-11-07 – 2023-11-29 (×23): 40 mg via SUBCUTANEOUS
  Filled 2023-11-07 (×23): qty 0.4

## 2023-11-07 MED ORDER — CARVEDILOL 25 MG PO TABS
25.0000 mg | ORAL_TABLET | Freq: Two times a day (BID) | ORAL | Status: DC
  Administered 2023-11-07 – 2023-11-28 (×42): 25 mg via ORAL
  Filled 2023-11-07 (×42): qty 1

## 2023-11-07 MED ORDER — OXYCODONE HCL 5 MG PO TABS
15.0000 mg | ORAL_TABLET | Freq: Every day | ORAL | Status: DC
  Administered 2023-11-07: 15 mg via ORAL
  Filled 2023-11-07: qty 3

## 2023-11-07 MED ORDER — ONDANSETRON HCL 4 MG PO TABS
4.0000 mg | ORAL_TABLET | Freq: Four times a day (QID) | ORAL | Status: DC | PRN
Start: 2023-11-07 — End: 2023-12-03

## 2023-11-07 MED ORDER — OXYCODONE HCL 5 MG PO TABS: 15.0000 mg | ORAL_TABLET | ORAL | Status: DC | PRN

## 2023-11-07 MED ORDER — SODIUM CHLORIDE 0.9% FLUSH: 9.0000 mL | INTRAVENOUS | Status: DC | PRN

## 2023-11-07 MED ORDER — PANTOPRAZOLE SODIUM 40 MG PO TBEC
40.0000 mg | DELAYED_RELEASE_TABLET | Freq: Every day | ORAL | Status: DC
  Administered 2023-11-07 – 2023-12-03 (×27): 40 mg via ORAL
  Filled 2023-11-07 (×27): qty 1

## 2023-11-07 NOTE — Progress Notes (Signed)
 Handoff report  regarding dilaudid pca done with Rushie Goltz, RN . All settings correct, pt documentation completed on mar . Patient is alert and oriented x 4, c/o pain 9/10, encouraged to use pca button . Verbalizes an understanding.

## 2023-11-07 NOTE — Progress Notes (Signed)
 Paged provider requesting PCA as patient states his q2H dilaudid does nothing to help.

## 2023-11-07 NOTE — Progress Notes (Addendum)
 PROGRESS NOTE    Joseph Haney  ZOX:[id number] DOB: 03/09/1976 DOA: 11/06/2023 PCP: Joseph Jericho, [name]  Chief Complaint  Patient presents with   Shortness of Breath   Weakness    Brief Narrative:   48 yo with hx metastatic colon cancer presenting with cancer related pain.   Assessment & Plan:   Principal Problem:   Cancer associated pain Active Problems:   Hypokalemia   Cancer of right colon (HCC)   Protein-calorie malnutrition, severe (HCC)  Cancer Related Pain Metastatic Colon Cancer At home takes oxycodone prn (15 mg he notes 4-6 times Joseph Haney day).  Recently started on fentanyl patch. Continue oxycodone and fentanyl patch. Pain uncontrolled despite IV dilaudid -> transition to PCA Dexamethasone per oncology CT with new and enlarged pulmonary nodules.  Enlargement of hypodense metastasis of medial liver dome with multiple new small satellite metastases.  Diffusely heterogeneous mixed lytic and sclerotic osseous metastatic disease throughout the axial skeleton.  Worsened widespread metastatic disease.  Hypokalemia  Trend  Hypertension Coreg  Elevated LFT's Related to metastatic disease  Leukocytosis Suspected related to malignancy, trend  Anemia Will trend   Severe Protein Calorie Malnutrition RD megace     DVT prophylaxis: lovenox Code Status: full Family Communication: brother at bedside, sister over the phone Disposition:   Status is: Inpatient Remains inpatient appropriate because: pain controlled   Consultants:  Oncology Palliative care  Procedures:  none  Antimicrobials:  Anti-infectives (From admission, onward)    None       Subjective: Pain is uncontrolled   Objective: Vitals:   11/07/23 0015 11/07/23 0232 11/07/23 0626 11/07/23 1343  BP: (!) 145/90 (!) 141/92 (!) 145/91 105/68  Pulse: (!) 114 (!) 105 (!) 105 91  Resp: 20 18 18 16   Temp: 98 F (36.7 C) 98.3 F (36.8 C) 98.6 F (37 C) 98.1 F (36.7 C)  TempSrc:  Oral   Oral  SpO2: 99% 100% 100% 100%  Weight:      Height:        Intake/Output Summary (Last 24 hours) at 11/07/2023 1444 Last data filed at 11/07/2023 8119 Gross per 24 hour  Intake --  Output 100 ml  Net -100 ml   Filed Weights   11/06/23 1450  Weight: 56.2 kg    Examination:  General exam: Appears calm and comfortable - chronically ill appearing Respiratory system: unlabored Cardiovascular system: RRR Gastrointestinal system: Abdomen is nondistended, soft and nontender. Central nervous system: Alert and oriented. No focal neurological deficits. Extremities: no LEE    Data Reviewed: I have personally reviewed following labs and imaging studies  CBC: Recent Labs  Lab 11/06/23 1537 11/07/23 0524  WBC 16.2* 14.4*  NEUTROABS 11.6*  --   HGB 8.9* 8.2*  HCT 30.0* 27.0*  MCV 87.0 87.4  PLT 231 209    Basic Metabolic Panel: Recent Labs  Lab 11/06/23 1537 11/07/23 0524  NA 135 136  K 3.4* 3.9  CL 104 103  CO2 20* 21*  GLUCOSE 91 117*  BUN 14 11  CREATININE 0.53* 0.58*  CALCIUM 8.8* 8.6*    GFR: Estimated Creatinine Clearance: 88.8 mL/min (Joseph Haney) (by C-G formula based on SCr of 0.58 mg/dL (L)).  Liver Function Tests: Recent Labs  Lab 11/06/23 1537 11/07/23 0524  AST 138* 127*  ALT 96* 76*  ALKPHOS 956* 1,070*  BILITOT 0.8 0.7  PROT 8.1 7.4  ALBUMIN 2.6* 2.5*    CBG: No results for input(s): "GLUCAP" in the last 168 hours.   Recent  Results (from the past 240 hours)  Resp panel by RT-PCR (RSV, Flu Joseph Haney&B, Covid) Anterior Nasal Swab     Status: None   Collection Time: 11/06/23  3:23 PM   Specimen: Anterior Nasal Swab  Result Value Ref Range Status   SARS Coronavirus 2 by RT PCR NEGATIVE NEGATIVE Final    Comment: (NOTE) SARS-CoV-2 target nucleic acids are NOT DETECTED.  The SARS-CoV-2 RNA is generally detectable in upper respiratory specimens during the acute phase of infection. The lowest concentration of SARS-CoV-2 viral copies this assay can  detect is 138 copies/mL. Joseph Haney negative result does not preclude SARS-Cov-2 infection and should not be used as the sole basis for treatment or other patient management decisions. Joseph Haney negative result may occur with  improper specimen collection/handling, submission of specimen other than nasopharyngeal swab, presence of viral mutation(s) within the areas targeted by this assay, and inadequate number of viral copies(<138 copies/mL). Joseph Haney negative result must be combined with clinical observations, patient history, and epidemiological information. The expected result is Negative.  Fact Sheet for Patients:  Joseph Haney  Fact Sheet for Healthcare Providers:  Joseph Haney  This test is no t yet approved or cleared by the Joseph Haney and  has been authorized for detection and/or diagnosis of SARS-CoV-2 by Haney under an Emergency Use Authorization (EUA). This EUA will remain  in effect (meaning this test can be used) for the duration of the COVID-19 declaration under Section 564(b)(1) of the Act, 21 U.S.C.section 360bbb-3(b)(1), unless the authorization is terminated  or revoked sooner.       Influenza Joseph Haney by PCR NEGATIVE NEGATIVE Final   Influenza B by PCR NEGATIVE NEGATIVE Final    Comment: (NOTE) The Xpert Xpress SARS-CoV-2/FLU/RSV plus assay is intended as an aid in the diagnosis of influenza from Nasopharyngeal swab specimens and should not be used as Joseph Haney sole basis for treatment. Nasal washings and aspirates are unacceptable for Xpert Xpress SARS-CoV-2/FLU/RSV testing.  Fact Sheet for Patients: Joseph Haney  Fact Sheet for Healthcare Providers: Joseph Haney  This test is not yet approved or cleared by the Joseph Haney and has been authorized for detection and/or diagnosis of SARS-CoV-2 by Haney under an Emergency Use Authorization (EUA). This EUA will remain in  effect (meaning this test can be used) for the duration of the COVID-19 declaration under Section 564(b)(1) of the Act, 21 U.S.C. section 360bbb-3(b)(1), unless the authorization is terminated or revoked.     Resp Syncytial Virus by PCR NEGATIVE NEGATIVE Final    Comment: (NOTE) Fact Sheet for Patients: Joseph Haney  Fact Sheet for Healthcare Providers: Joseph Haney  This test is not yet approved or cleared by the Joseph Haney and has been authorized for detection and/or diagnosis of SARS-CoV-2 by Haney under an Emergency Use Authorization (EUA). This EUA will remain in effect (meaning this test can be used) for the duration of the COVID-19 declaration under Section 564(b)(1) of the Act, 21 U.S.C. section 360bbb-3(b)(1), unless the authorization is terminated or revoked.  Performed at [facility], [address] [place], [place] [address]          Radiology Studies: DG Chest 2 View Result Date: 11/06/2023 CLINICAL DATA:  Shortness of breath.  Weakness. EXAM: CHEST - 2 VIEW COMPARISON:  Subsequent chest CT, available at time of radiograph interpretation. FINDINGS: Right chest port in place. Lung volumes are low.The cardiomediastinal contours are normal. Right upper lobe nodule on CT is not well seen by radiograph. The additional small pulmonary nodules are  also not seen. Pulmonary vasculature is normal. Subsegmental atelectasis at the bases. No confluent consolidation, pleural effusion, or pneumothorax. No acute osseous abnormalities are seen. IMPRESSION: 1. Pulmonary nodules on subsequent CT are not well demonstrated by radiograph. 2. Subsegmental atelectasis at the lung bases. Electronically Signed   By: Narda Rutherford M.D.   On: 11/06/2023 18:37   CT CHEST ABDOMEN PELVIS W CONTRAST Result Date: 11/06/2023 CLINICAL DATA:  Chest pain, abdominal pain, metastatic colon cancer * Tracking Code: BO *  EXAM: CT CHEST, ABDOMEN, AND PELVIS WITH CONTRAST TECHNIQUE: Multidetector CT imaging of the chest, abdomen and pelvis was performed following the standard protocol during bolus administration of intravenous contrast. RADIATION DOSE REDUCTION: This exam was performed according to the departmental dose-optimization program which includes automated exposure control, adjustment of the mA and/or kV according to patient size and/or use of iterative reconstruction technique. CONTRAST:  OMNIPAQUE IOHEXOL 300 MG/ML  SOLN COMPARISON:  PET-CT, 06/29/2023 FINDINGS: CT CHEST FINDINGS Cardiovascular: Right chest port catheter. Normal heart size. Left coronary artery calcifications. No pericardial effusion. Mediastinum/Nodes: No enlarged mediastinal, hilar, or axillary lymph nodes. Thyroid gland, trachea, and esophagus demonstrate no significant findings. Lungs/Pleura: Interval enlargement and increased in solid character of Akin Yi dominant spiculated nodule of the right upper lobe measuring 1.1 x 1.0 cm, previously 0.8 x 0.7 cm when measured similarly (series 6, image 26). Numerous additional small nodules, the majority very tiny, some of the larger nodules seen on prior examination slightly increased in size, for example in the medial left upper lobe measuring 0.5 cm, previously 0.2 cm (series 6, image 56). Bandlike scarring of the middle lobe and lingula. No pleural effusion or pneumothorax. Musculoskeletal: No chest wall abnormality. No acute osseous findings. CT ABDOMEN PELVIS FINDINGS Hepatobiliary: Interval enlargement of Culley Hedeen hypodense metastasis of the medial liver dome, hepatic segment IV Persia Lintner, measuring 5.2 x 4.3 cm, previously 3.2 x 3.1 cm. Multiple additional new small satellite lesions. No gallstones, gallbladder wall thickening, or biliary dilatation. Pancreas: Unremarkable. No pancreatic ductal dilatation or surrounding inflammatory changes. Spleen: Normal in size without significant abnormality. Adrenals/Urinary  Tract: Adrenal glands are unremarkable. Kidneys are normal, without renal calculi, solid lesion, or hydronephrosis. Bladder is unremarkable. Stomach/Bowel: Stomach is within normal limits. Status post partial right hemicolectomy. No evidence of bowel wall thickening, distention, or inflammatory changes. Vascular/Lymphatic: Aortic atherosclerosis. No enlarged abdominal or pelvic lymph nodes. Reproductive: No mass or other abnormality. Other: No abdominal wall hernia or abnormality. No ascites. Musculoskeletal: No acute osseous findings. Diffusely heterogeneous mixed lytic and sclerotic osseous metastatic disease throughout the axial skeleton, significantly increased in conspicuity compared to prior examination. IMPRESSION: 1. New and enlarged pulmonary nodules. 2. Interval enlargement of Marcena Dias hypodense metastasis of the medial liver dome with multiple new small satellite metastases. 3. Diffusely heterogeneous mixed lytic and sclerotic osseous metastatic disease throughout the axial skeleton, significantly increased in conspicuity compared to prior examination. 4. Constellation of findings is consistent with worsened, widespread metastatic disease. 5. Status post partial right hemicolectomy. 6. Coronary artery disease. Aortic Atherosclerosis (ICD10-I70.0). Electronically Signed   By: Jearld Lesch M.D.   On: 11/06/2023 17:34        Scheduled Meds:  acetaminophen  1,000 mg Oral Q8H   carvedilol  25 mg Oral BID WC   dexamethasone  4 mg Intravenous Q24H   enoxaparin (LOVENOX) injection  40 mg Subcutaneous Q24H   feeding supplement  237 mL Oral BID BM   fentaNYL  1 patch Transdermal Q72H   HYDROmorphone  Intravenous Q4H   megestrol  400 mg Oral Daily   oxyCODONE  15 mg Oral 5 X Daily   pantoprazole  40 mg Oral Daily   potassium chloride SA  20 mEq Oral Daily   Continuous Infusions:  dextrose 5% lactated ringers with KCl 20 mEq/L 100 mL/hr at 11/07/23 1103     LOS: 1 day    Time spent: over 30  min    Lacretia Nicks, MD [facility]   To contact the attending provider between 7A-7P or the covering provider during after hours 7P-7A, please log into the web site [email or web address] and access using universal [place] password for that web site. If you do not have the password, please call the hospital operator.  11/07/2023, 2:44 PM

## 2023-11-07 NOTE — Progress Notes (Signed)
 Notified MD I am not comfortable giving 15 of oxy as ordered in addition to his dilaudid PCA.

## 2023-11-07 NOTE — Progress Notes (Signed)
 Joseph Haney   DOB:05-13-1976   ZO#:[id number]    ASSESSMENT & PLAN:  Metastatic colon cancer to the lungs, bone and liver The patient has been off treatment for several months His performance status at home is really poor and he has significant decline in performance status He is only on palliative mode over the past few months with pain control and supportive care Recommend palliative care consult to further address goals of care  Uncontrolled cancer associated pain He has significant diffuse bone pain Imaging study revealed progression of disease in his bone I would discontinue ketorolac and switch to dexamethasone in addition to his pain medicine for pain control Appreciate assistance from palliative care team for pain management  Abnormal liver enzymes Severe anemia Due to his metastatic disease Monitor closely  Goals of care discussion It appears that the patient is still at full code Given his poor prognosis, I recommend transitioning his care towards comfort and hopefully the patient would be receptive to the idea of DO NOT RESUSCITATE eventually The plan for the weekend would be to try to get his pain under control Dr. Mosetta Putt will resume care next week   Artis Delay, MD 11/07/2023 12:26 PM  Subjective:  I was notified of his admission and hospitalist team from last night requested assistance to manage his pain.  I reviewed his records extensively He has not been receiving chemotherapy over the past few months, last dose of Xeloda was around the new year, according to the patient He lives at home with his mother He has not been doing well with significant decline in overall mobility due to uncontrolled pain CBC, CMP as well as CT imaging were reviewed  Objective:  Vitals:   11/07/23 0232 11/07/23 0626  BP: (!) 141/92 (!) 145/91  Pulse: (!) 105 (!) 105  Resp: 18 18  Temp: 98.3 F (36.8 C) 98.6 F (37 C)  SpO2: 100% 100%     Intake/Output Summary (Last 24 hours) at  11/07/2023 1226 Last data filed at 11/07/2023 0704 Gross per 24 hour  Intake --  Output 100 ml  Net -100 ml

## 2023-11-07 NOTE — Progress Notes (Signed)
   11/07/23 1414  TOC Brief Assessment  Insurance and Status Reviewed  Patient has primary care physician Yes  Home environment has been reviewed home alone  Prior level of function: independent  Prior/Current Home Services No current home services  Social Drivers of Health Review SDOH reviewed no interventions necessary  Readmission risk has been reviewed Yes  Transition of care needs transition of care needs identified, TOC will continue to follow

## 2023-11-08 ENCOUNTER — Encounter (HOSPITAL_COMMUNITY): Payer: Self-pay | Admitting: Internal Medicine

## 2023-11-08 DIAGNOSIS — G893 Neoplasm related pain (acute) (chronic): Secondary | ICD-10-CM | POA: Diagnosis not present

## 2023-11-08 DIAGNOSIS — C189 Malignant neoplasm of colon, unspecified: Secondary | ICD-10-CM | POA: Diagnosis not present

## 2023-11-08 LAB — MAGNESIUM: Magnesium: 1.8 mg/dL (ref 1.7–2.4)

## 2023-11-08 LAB — CBC WITH DIFFERENTIAL/PLATELET
Abs Immature Granulocytes: 0 10*3/uL (ref 0.00–0.07)
Basophils Absolute: 0 10*3/uL (ref 0.0–0.1)
Basophils Relative: 0 %
Eosinophils Absolute: 0 10*3/uL (ref 0.0–0.5)
Eosinophils Relative: 0 %
HCT: 26.2 % — ABNORMAL LOW (ref 39.0–52.0)
Hemoglobin: 8 g/dL — ABNORMAL LOW (ref 13.0–17.0)
Lymphocytes Relative: 13 %
Lymphs Abs: 1.7 10*3/uL (ref 0.7–4.0)
MCH: 26.2 pg (ref 26.0–34.0)
MCHC: 30.5 g/dL (ref 30.0–36.0)
MCV: 85.9 fL (ref 80.0–100.0)
Monocytes Absolute: 0.5 10*3/uL (ref 0.1–1.0)
Monocytes Relative: 4 %
Neutro Abs: 11 10*3/uL — ABNORMAL HIGH (ref 1.7–7.7)
Neutrophils Relative %: 83 %
Platelets: 226 10*3/uL (ref 150–400)
RBC: 3.05 MIL/uL — ABNORMAL LOW (ref 4.22–5.81)
RDW: 18 % — ABNORMAL HIGH (ref 11.5–15.5)
WBC: 13.3 10*3/uL — ABNORMAL HIGH (ref 4.0–10.5)
nRBC: 0.2 % (ref 0.0–0.2)

## 2023-11-08 LAB — COMPREHENSIVE METABOLIC PANEL
ALT: 72 U/L — ABNORMAL HIGH (ref 0–44)
AST: 133 U/L — ABNORMAL HIGH (ref 15–41)
Albumin: 2.2 g/dL — ABNORMAL LOW (ref 3.5–5.0)
Alkaline Phosphatase: 973 U/L — ABNORMAL HIGH (ref 38–126)
Anion gap: 10 (ref 5–15)
BUN: 18 mg/dL (ref 6–20)
CO2: 22 mmol/L (ref 22–32)
Calcium: 8.4 mg/dL — ABNORMAL LOW (ref 8.9–10.3)
Chloride: 103 mmol/L (ref 98–111)
Creatinine, Ser: 0.83 mg/dL (ref 0.61–1.24)
GFR, Estimated: >60 mL/min (ref >60)
Glucose, Bld: 111 mg/dL — ABNORMAL HIGH (ref 70–99)
Potassium: 4.5 mmol/L (ref 3.5–5.1)
Sodium: 135 mmol/L (ref 135–145)
Total Bilirubin: 0.5 mg/dL (ref 0.0–1.2)
Total Protein: 7 g/dL (ref 6.5–8.1)

## 2023-11-08 LAB — HIV ANTIBODY (ROUTINE TESTING W REFLEX): HIV Screen 4th Generation wRfx: NONREACTIVE

## 2023-11-08 LAB — PHOSPHORUS: Phosphorus: 3.5 mg/dL (ref 2.5–4.6)

## 2023-11-08 MED ORDER — ONDANSETRON HCL 4 MG/2ML IJ SOLN: 4.0000 mg | Freq: Four times a day (QID) | INTRAMUSCULAR | Status: DC | PRN

## 2023-11-08 MED ORDER — SODIUM CHLORIDE 0.9% FLUSH: 9.0000 mL | INTRAVENOUS | Status: DC | PRN

## 2023-11-08 MED ORDER — MORPHINE SULFATE ER 15 MG PO TBCR
15.0000 mg | EXTENDED_RELEASE_TABLET | Freq: Two times a day (BID) | ORAL | Status: DC
  Administered 2023-11-08 – 2023-11-09 (×3): 15 mg via ORAL
  Filled 2023-11-08 (×3): qty 1

## 2023-11-08 MED ORDER — NALOXONE HCL 0.4 MG/ML IJ SOLN: 0.4000 mg | INTRAMUSCULAR | Status: DC | PRN

## 2023-11-08 MED ORDER — HYDROMORPHONE 1 MG/ML IV SOLN
INTRAVENOUS | Status: DC
  Administered 2023-11-08: 30 mg via INTRAVENOUS
  Filled 2023-11-08: qty 30

## 2023-11-08 NOTE — Progress Notes (Signed)
.  25 ml of hydromorphone wasted in stericycle with Marylene Land, RN .

## 2023-11-08 NOTE — Consult Note (Signed)
 Consultation Note Date: 11/08/2023   Patient Name: Joseph Haney  DOB: Jul 03, 1976  MRN: [id number]  Age / Sex: 48 y.o., male  PCP: Frederic Jericho, [name] Referring Physician: Zigmund Daniel., *  Reason for Consultation: Pain control  HPI/Patient Profile: 48 y.o. male admitted on 11/06/2023   Clinical Assessment and Goals of Care: 48 year old gentleman who lives at home with his mother, past medical history significant for metastatic colon cancer to the lungs bone and liver.  Admitted for uncontrolled cancer associated pain.  Patient has diffuse bone pain and recent imaging studies revealed progression of disease in the bone.  Has ongoing significant decline in performance status.  Was on palliative treatments recently.  Palliative consult for pain management and also to address further goals of care has been requested. Patient also has severe anemia and abnormal liver enzymes due to metastatic disease. Patient placed on Dilaudid PCA. Palliative consult request received.  Patient seen and examined.  Mother also present at bedside. Palliative medicine is specialized medical care for people living with serious illness. It focuses on providing relief from the symptoms and stress of a serious illness. The goal is to improve quality of life for both the patient and the family. Goals of care: Broad aims of medical therapy in relation to the patient's values and preferences. Our aim is to provide medical care aimed at enabling patients to achieve the goals that matter most to them, given the circumstances of their particular medical situation and their constraints.  Pain management options discussed with the patient.  He was on oxycodone immediate release as well as MS Contin long-acting before pain at home.  Patient complains of ongoing discomfort despite PCA.  He states that the PCA works but that it does not  provide sustained relief.  At present, he rates his pain at 9 out of 10.  Discussed about adjusting bolus rate of Dilaudid PCA from 0.3 to 0.5 mg to be available every 8 minutes on an as-needed basis.  Continue other adjuvants, already on dexamethasone 4 diffuse bone pain metastatic cancer pain.  Also on bowel and antiemetic regimen, continue the same.  Patient is under the care of Dr. Mosetta Putt from medical oncology.  NEXT OF KIN  Mother at bedside.   SUMMARY OF RECOMMENDATIONS    Full Code.  Adjust Dilaudid PCA : Bolus only 0.5 mg Q 8 minutes, also has loading dose, monitor PCA needs.  Resume long acting opioid MS Contin 15 mg PO BID.  Also on adjuvant steroids.  Continue to monitor pain medication needs.  Thank you for the consult.   Code Status/Advance Care Planning: Full code   Symptom Management:   As above.   Palliative Prophylaxis:  Frequent Pain Assessment  Additional Recommendations (Limitations, Scope, Preferences): Full Scope Treatment  Psycho-social/Spiritual:  Desire for further Chaplaincy support:yes Additional Recommendations: Caregiving  Support/Resources  Prognosis:  Unable to determine  Discharge Planning: To Be Determined      Primary Diagnoses: Present on Admission:  Cancer of right colon (HCC)  Protein-calorie malnutrition, severe (HCC)  Cancer associated pain  Hypokalemia   I have reviewed the medical record, interviewed the patient and family, and examined the patient. The following aspects are pertinent.  Past Medical History:  Diagnosis Date   Alcohol abuse    Cancer (HCC)    GERD (gastroesophageal reflux disease)    Seizure (HCC)    Social History   Socioeconomic History   Marital status: Married    Spouse name: Not on file   Number of children: 3   Years of education: Not on file   Highest education level: Not on file  Occupational History   Not on file  Tobacco Use   Smoking status: Never   Smokeless tobacco: Never  Vaping Use    Vaping status: Never Used  Substance and Sexual Activity   Alcohol use: Not Currently    Comment: drank from age 24 - 89, quit 7 years ago   Drug use: No   Sexual activity: Yes  Other Topics Concern   Not on file  Social History Narrative   Not on file   Social Drivers of Health   Financial Resource Strain: Not on file  Food Insecurity: No Food Insecurity (11/07/2023)   Hunger Vital Sign    Worried About Running Out of Food in the Last Year: Never true    Ran Out of Food in the Last Year: Never true  Transportation Needs: Unmet Transportation Needs (11/07/2023)   PRAPARE - Administrator, Civil Service (Medical): No    Lack of Transportation (Non-Medical): Yes  Physical Activity: Not on file  Stress: Not on file  Social Connections: Unknown (11/07/2023)   Social Connection and Isolation Panel [NHANES]    Frequency of Communication with Friends and Family: Three times a week    Frequency of Social Gatherings with Friends and Family: More than three times a week    Attends Religious Services: Patient unable to answer    Active Member of Clubs or Organizations: No    Attends Banker Meetings: Patient unable to answer    Marital Status: Widowed   Family History  Problem Relation Age of Onset   Coronary artery disease Mother    Coronary artery disease Father    Heart attack Father 77   Cancer Cousin 63       prostate   Scheduled Meds:  acetaminophen  1,000 mg Oral Q8H   carvedilol  25 mg Oral BID WC   dexamethasone  4 mg Intravenous Q24H   enoxaparin (LOVENOX) injection  40 mg Subcutaneous Q24H   feeding supplement  237 mL Oral BID BM   HYDROmorphone   Intravenous Q4H   megestrol  400 mg Oral Daily   pantoprazole  40 mg Oral Daily   potassium chloride SA  20 mEq Oral Daily   Continuous Infusions: PRN Meds:.naloxone **AND** sodium chloride flush, ondansetron (ZOFRAN) IV, ondansetron **OR** [DISCONTINUED] ondansetron (ZOFRAN) IV Medications Prior to  Admission:  Prior to Admission medications   Medication Sig Start Date End Date Taking? Authorizing Provider  acetaminophen (TYLENOL 8 HOUR) 650 MG CR tablet Take 1 tablet (650 mg total) by mouth every 8 (eight) hours as needed for pain. Patient taking differently: Take 1,300 mg by mouth every 8 (eight) hours as needed for pain. 08/17/23  Yes Malachy Mood, MD  carvedilol (COREG) 25 MG tablet Take 1 tablet (25 mg total) by mouth 2 (two) times daily with a meal. Patient taking differently: Take 25 mg  by mouth 2 (two) times daily as needed (high BP). 01/15/22  Yes Malachy Mood, MD  dexamethasone (DECADRON) 4 MG tablet Take 1 tablet (4 mg total) by mouth daily. Take for 3-5 days after chemo for nausea and fatigue 08/17/23  Yes Malachy Mood, MD  diphenoxylate-atropine (LOMOTIL) 2.5-0.025 MG tablet Take 1 tablet by mouth 4 (four) times daily as needed for diarrhea or loose stools. Patient taking differently: Take 1 tablet by mouth daily as needed for diarrhea or loose stools. 12/02/22  Yes Pollyann Samples, NP  HYDROcodone-acetaminophen (NORCO/VICODIN) 5-325 MG tablet Take 1 tablet by mouth every 4 (four) hours as needed for moderate pain (pain score 4-6).   Yes [provider]  megestrol (MEGACE) 400 MG/10ML suspension Take 10 mLs (400 mg total) by mouth daily. 10/27/23  Yes Boscia, Kathlynn Grate, NP  morphine (MS CONTIN) 15 MG 12 hr tablet Take 15 mg by mouth 2 (two) times daily as needed for pain. 09/14/23  Yes [provider]  ondansetron (ZOFRAN) 8 MG tablet Take 1 tablet (8 mg total) by mouth every 8 (eight) hours as needed for nausea or vomiting. Start on the third day after chemotherapy. 10/22/23  Yes Malachy Mood, MD  oxyCODONE (ROXICODONE) 15 MG immediate release tablet Take 15 mg by mouth every 4 (four) hours as needed for pain.   Yes [provider]  polyethylene glycol (MIRALAX / GLYCOLAX) 17 g packet Take 17 g by mouth daily as needed for mild constipation or moderate constipation. 08/01/21   Yes [name], Rebekah Chesterfield, MD  prochlorperazine (COMPAZINE) 10 MG tablet Take 1 tablet (10 mg total) by mouth every 6 (six) hours as needed for nausea or vomiting. 07/24/23  Yes Malachy Mood, MD  promethazine (PHENERGAN) 25 MG tablet Take 1 tablet (25 mg total) by mouth every 6 (six) hours as needed for nausea or vomiting. 05/26/23  Yes Malachy Mood, MD  capecitabine (XELODA) 500 MG tablet Take 3 tablets (1,500 mg total) by mouth 2 (two) times daily after a meal. Take for 14 days on, then 7 days off. Repeat every 21 days. Start with IV chemo Patient not taking: Reported on 11/07/2023 06/15/23   Malachy Mood, MD  dexamethasone (DECADRON) 4 MG tablet Take 2 tablets (8 mg total) by mouth daily. Patient not taking: Reported on 11/07/2023 10/22/23   Malachy Mood, MD  fentaNYL (DURAGESIC) 50 MCG/HR SMARTSIG:1 Patch(s) Topical Every 12 Hours Patient not taking: Reported on 11/07/2023 10/16/23   [provider]  gabapentin (NEURONTIN) 300 MG capsule Take 300 mg by mouth 3 (three) times daily. Patient not taking: Reported on 11/07/2023    [provider]  lidocaine-prilocaine (EMLA) cream Apply to affected area once Patient not taking: Reported on 11/07/2023 07/24/23   Malachy Mood, MD  magic mouthwash (nystatin, lidocaine, diphenhydrAMINE, alum & mag hydroxide) suspension Take 5 mLs by mouth 3 (three) times daily as needed for mouth pain. Patient not taking: Reported on 01/07/2023 12/02/22   Pollyann Samples, NP   Allergies  Allergen Reactions   Latex Swelling and Other (See Comments)    Pain and swelling of penis after urinary catheter placed last surgery.   Morphine And Codeine Itching    Pt still takes this medication even with this reaction.    Review of Systems + chest and abdominal pain.   Physical Exam Awake alert Complains of pain Generalized abdominal tenderness Mild to moderate distress due to pain.   Vital Signs: BP 127/81 (BP Location: Right Arm)   Pulse  87   Temp 98.1 F (36.7 C) (Oral)   Resp  20   Ht 5\' 4"  (1.626 m)   Wt 56.2 kg   SpO2 100%   BMI 21.28 kg/m  Pain Scale: 0-10 POSS *See Group Information*: 1-Acceptable,Awake and alert Pain Score: 9    SpO2: SpO2: 100 % O2 Device:SpO2: 100 % O2 Flow Rate: .O2 Flow Rate (L/min): 1 L/min  IO: Intake/output summary:  Intake/Output Summary (Last 24 hours) at 11/08/2023 1116 Last data filed at 11/08/2023 0800 Gross per 24 hour  Intake 2412.87 ml  Output 525 ml  Net 1887.87 ml    LBM:   Baseline Weight: Weight: 56.2 kg Most recent weight: Weight: 56.2 kg     Palliative Assessment/Data:   PPS 50%  Time In:  10 Time Out:  11 Time Total:  60 min.  Greater than 50%  of this time was spent counseling and coordinating care related to the above assessment and plan.  Signed by: Rosalin Hawking, MD   Please contact Palliative Medicine Team phone at [phone] for questions and concerns.  For individual provider: See Loretha Stapler

## 2023-11-08 NOTE — Progress Notes (Signed)
 PROGRESS NOTE    Joseph Haney  WUJ:[id number] DOB: September 12, 1975 DOA: 11/06/2023 PCP: Frederic Jericho, [name]  Chief Complaint  Patient presents with   Shortness of Breath   Weakness    Brief Narrative:   48 yo with hx metastatic colon cancer presenting with cancer related pain.   Assessment & Plan:   Principal Problem:   Cancer associated pain Active Problems:   Hypokalemia   Cancer of right colon (HCC)   Protein-calorie malnutrition, severe (HCC)  Cancer Related Pain Metastatic Colon Cancer At home takes oxycodone prn (15 mg he notes 4-6 times Zadie Deemer day).  Recently started on fentanyl patch. Continue oxycodone and fentanyl patch. Pain uncontrolled - continue PCA, long acting morphine - appreciate palliative care assistance Dexamethasone per oncology CT with new and enlarged pulmonary nodules.  Enlargement of hypodense metastasis of medial liver dome with multiple new small satellite metastases.  Diffusely heterogeneous mixed lytic and sclerotic osseous metastatic disease throughout the axial skeleton.  Worsened widespread metastatic disease.  Hypokalemia  Trend  Hypertension Coreg  Elevated LFT's Related to metastatic disease  Leukocytosis Suspected related to malignancy, trend  Anemia Will trend   Severe Protein Calorie Malnutrition RD megace     DVT prophylaxis: lovenox Code Status: full Family Communication: sister, daughter at bedside Disposition:   Status is: Inpatient Remains inpatient appropriate because: pain controlled   Consultants:  Oncology Palliative care  Procedures:  none  Antimicrobials:  Anti-infectives (From admission, onward)    None       Subjective: Continued 9/10 pain  Objective: Vitals:   11/08/23 0355 11/08/23 0403 11/08/23 0702 11/08/23 1044  BP:  127/81    Pulse:  87    Resp: 16 (!) 22 20 20   Temp:  98.1 F (36.7 C)    TempSrc:  Oral    SpO2: 100% 100% 98% 100%  Weight:      Height:         Intake/Output Summary (Last 24 hours) at 11/08/2023 1512 Last data filed at 11/08/2023 0800 Gross per 24 hour  Intake 2412.87 ml  Output 525 ml  Net 1887.87 ml   Filed Weights   11/06/23 1450  Weight: 56.2 kg    Examination:  General: No acute distress. Cardiovascular: RRR Lungs: unlabored Abdomen: s/nt/nd Neurological: Alert and oriented 3. Moves all extremities 4 with equal strength. Cranial nerves II through XII grossly intact. Extremities: No clubbing or cyanosis. No edema.   Data Reviewed: I have personally reviewed following labs and imaging studies  CBC: Recent Labs  Lab 11/06/23 1537 11/07/23 0524 11/08/23 0454  WBC 16.2* 14.4* 13.3*  NEUTROABS 11.6*  --  11.0*  HGB 8.9* 8.2* 8.0*  HCT 30.0* 27.0* 26.2*  MCV 87.0 87.4 85.9  PLT 231 209 226    Basic Metabolic Panel: Recent Labs  Lab 11/06/23 1537 11/07/23 0524 11/08/23 0454  NA 135 136 135  K 3.4* 3.9 4.5  CL 104 103 103  CO2 20* 21* 22  GLUCOSE 91 117* 111*  BUN 14 11 18   CREATININE 0.53* 0.58* 0.83  CALCIUM 8.8* 8.6* 8.4*  MG  --   --  1.8  PHOS  --   --  3.5    GFR: Estimated Creatinine Clearance: 85.6 mL/min (by C-G formula based on SCr of 0.83 mg/dL).  Liver Function Tests: Recent Labs  Lab 11/06/23 1537 11/07/23 0524 11/08/23 0454  AST 138* 127* 133*  ALT 96* 76* 72*  ALKPHOS 956* 1,070* 973*  BILITOT 0.8 0.7 0.5  PROT 8.1 7.4 7.0  ALBUMIN 2.6* 2.5* 2.2*    CBG: Recent Labs  Lab 11/07/23 1759  GLUCAP 283*     Recent Results (from the past 240 hours)  Resp panel by RT-PCR (RSV, Flu Carlin Mamone&B, Covid) Anterior Nasal Swab     Status: None   Collection Time: 11/06/23  3:23 PM   Specimen: Anterior Nasal Swab  Result Value Ref Range Status   SARS Coronavirus 2 by RT PCR NEGATIVE NEGATIVE Final    Comment: (NOTE) SARS-CoV-2 target nucleic acids are NOT DETECTED.  The SARS-CoV-2 RNA is generally detectable in upper respiratory specimens during the acute phase of infection.  The lowest concentration of SARS-CoV-2 viral copies this assay can detect is 138 copies/mL. Mariafernanda Hendricksen negative result does not preclude SARS-Cov-2 infection and should not be used as the sole basis for treatment or other patient management decisions. Ival Basquez negative result may occur with  improper specimen collection/handling, submission of specimen other than nasopharyngeal swab, presence of viral mutation(s) within the areas targeted by this assay, and inadequate number of viral copies(<138 copies/mL). Panzy Bubeck negative result must be combined with clinical observations, patient history, and epidemiological information. The expected result is Negative.  Fact Sheet for Patients:  BloggerCourse.com  Fact Sheet for Healthcare Providers:  SeriousBroker.it  This test is no t yet approved or cleared by the [place] FDA and  has been authorized for detection and/or diagnosis of SARS-CoV-2 by FDA under an Emergency Use Authorization (EUA). This EUA will remain  in effect (meaning this test can be used) for the duration of the COVID-19 declaration under Section 564(b)(1) of the Act, 21 U.S.C.section 360bbb-3(b)(1), unless the authorization is terminated  or revoked sooner.       Influenza Darrion Wyszynski by PCR NEGATIVE NEGATIVE Final   Influenza B by PCR NEGATIVE NEGATIVE Final    Comment: (NOTE) The Xpert Xpress SARS-CoV-2/FLU/RSV plus assay is intended as an aid in the diagnosis of influenza from Nasopharyngeal swab specimens and should not be used as Donevin Sainsbury sole basis for treatment. Nasal washings and aspirates are unacceptable for Xpert Xpress SARS-CoV-2/FLU/RSV testing.  Fact Sheet for Patients: BloggerCourse.com  Fact Sheet for Healthcare Providers: SeriousBroker.it  This test is not yet approved or cleared by the [place] FDA and has been authorized for detection and/or diagnosis of SARS-CoV-2 by FDA  under an Emergency Use Authorization (EUA). This EUA will remain in effect (meaning this test can be used) for the duration of the COVID-19 declaration under Section 564(b)(1) of the Act, 21 U.S.C. section 360bbb-3(b)(1), unless the authorization is terminated or revoked.     Resp Syncytial Virus by PCR NEGATIVE NEGATIVE Final    Comment: (NOTE) Fact Sheet for Patients: BloggerCourse.com  Fact Sheet for Healthcare Providers: SeriousBroker.it  This test is not yet approved or cleared by the [place] FDA and has been authorized for detection and/or diagnosis of SARS-CoV-2 by FDA under an Emergency Use Authorization (EUA). This EUA will remain in effect (meaning this test can be used) for the duration of the COVID-19 declaration under Section 564(b)(1) of the Act, 21 U.S.C. section 360bbb-3(b)(1), unless the authorization is terminated or revoked.  Performed at [facility], [address] [place], [place] [address]          Radiology Studies: DG Chest 2 View Result Date: 11/06/2023 CLINICAL DATA:  Shortness of breath.  Weakness. EXAM: CHEST - 2 VIEW COMPARISON:  Subsequent chest CT, available at time of radiograph interpretation. FINDINGS: Right chest port in  place. Lung volumes are low.The cardiomediastinal contours are normal. Right upper lobe nodule on CT is not well seen by radiograph. The additional small pulmonary nodules are also not seen. Pulmonary vasculature is normal. Subsegmental atelectasis at the bases. No confluent consolidation, pleural effusion, or pneumothorax. No acute osseous abnormalities are seen. IMPRESSION: 1. Pulmonary nodules on subsequent CT are not well demonstrated by radiograph. 2. Subsegmental atelectasis at the lung bases. Electronically Signed   By: Narda Rutherford M.D.   On: 11/06/2023 18:37   CT CHEST ABDOMEN PELVIS W CONTRAST Result Date: 11/06/2023 CLINICAL DATA:  Chest  pain, abdominal pain, metastatic colon cancer * Tracking Code: BO * EXAM: CT CHEST, ABDOMEN, AND PELVIS WITH CONTRAST TECHNIQUE: Multidetector CT imaging of the chest, abdomen and pelvis was performed following the standard protocol during bolus administration of intravenous contrast. RADIATION DOSE REDUCTION: This exam was performed according to the departmental dose-optimization program which includes automated exposure control, adjustment of the mA and/or kV according to patient size and/or use of iterative reconstruction technique. CONTRAST:  OMNIPAQUE IOHEXOL 300 MG/ML  SOLN COMPARISON:  PET-CT, 06/29/2023 FINDINGS: CT CHEST FINDINGS Cardiovascular: Right chest port catheter. Normal heart size. Left coronary artery calcifications. No pericardial effusion. Mediastinum/Nodes: No enlarged mediastinal, hilar, or axillary lymph nodes. Thyroid gland, trachea, and esophagus demonstrate no significant findings. Lungs/Pleura: Interval enlargement and increased in solid character of Natalea Sutliff dominant spiculated nodule of the right upper lobe measuring 1.1 x 1.0 cm, previously 0.8 x 0.7 cm when measured similarly (series 6, image 26). Numerous additional small nodules, the majority very tiny, some of the larger nodules seen on prior examination slightly increased in size, for example in the medial left upper lobe measuring 0.5 cm, previously 0.2 cm (series 6, image 56). Bandlike scarring of the middle lobe and lingula. No pleural effusion or pneumothorax. Musculoskeletal: No chest wall abnormality. No acute osseous findings. CT ABDOMEN PELVIS FINDINGS Hepatobiliary: Interval enlargement of Mariajose Mow hypodense metastasis of the medial liver dome, hepatic segment IV Ursula Dermody, measuring 5.2 x 4.3 cm, previously 3.2 x 3.1 cm. Multiple additional new small satellite lesions. No gallstones, gallbladder wall thickening, or biliary dilatation. Pancreas: Unremarkable. No pancreatic ductal dilatation or surrounding inflammatory changes. Spleen:  Normal in size without significant abnormality. Adrenals/Urinary Tract: Adrenal glands are unremarkable. Kidneys are normal, without renal calculi, solid lesion, or hydronephrosis. Bladder is unremarkable. Stomach/Bowel: Stomach is within normal limits. Status post partial right hemicolectomy. No evidence of bowel wall thickening, distention, or inflammatory changes. Vascular/Lymphatic: Aortic atherosclerosis. No enlarged abdominal or pelvic lymph nodes. Reproductive: No mass or other abnormality. Other: No abdominal wall hernia or abnormality. No ascites. Musculoskeletal: No acute osseous findings. Diffusely heterogeneous mixed lytic and sclerotic osseous metastatic disease throughout the axial skeleton, significantly increased in conspicuity compared to prior examination. IMPRESSION: 1. New and enlarged pulmonary nodules. 2. Interval enlargement of Kolbey Teichert hypodense metastasis of the medial liver dome with multiple new small satellite metastases. 3. Diffusely heterogeneous mixed lytic and sclerotic osseous metastatic disease throughout the axial skeleton, significantly increased in conspicuity compared to prior examination. 4. Constellation of findings is consistent with worsened, widespread metastatic disease. 5. Status post partial right hemicolectomy. 6. Coronary artery disease. Aortic Atherosclerosis (ICD10-I70.0). Electronically Signed   By: Jearld Lesch M.D.   On: 11/06/2023 17:34        Scheduled Meds:  acetaminophen  1,000 mg Oral Q8H   carvedilol  25 mg Oral BID WC   dexamethasone  4 mg Intravenous Q24H   enoxaparin (LOVENOX) injection  40 mg Subcutaneous Q24H   feeding supplement  237 mL Oral BID BM   HYDROmorphone   Intravenous Q4H   megestrol  400 mg Oral Daily   morphine  15 mg Oral Q12H   pantoprazole  40 mg Oral Daily   potassium chloride SA  20 mEq Oral Daily   Continuous Infusions:     LOS: 2 days    Time spent: over 30 min    Lacretia Nicks, MD [facility]   To contact the attending provider between 7A-7P or the covering provider during after hours 7P-7A, please log into the web site [email or web address] and access using universal [place] password for that web site. If you do not have the password, please call the hospital operator.  11/08/2023, 3:12 PM

## 2023-11-09 ENCOUNTER — Other Ambulatory Visit: Payer: Self-pay

## 2023-11-09 DIAGNOSIS — G893 Neoplasm related pain (acute) (chronic): Secondary | ICD-10-CM | POA: Diagnosis not present

## 2023-11-09 DIAGNOSIS — E440 Moderate protein-calorie malnutrition: Secondary | ICD-10-CM | POA: Insufficient documentation

## 2023-11-09 LAB — COMPREHENSIVE METABOLIC PANEL
ALT: 60 U/L — ABNORMAL HIGH (ref 0–44)
AST: 121 U/L — ABNORMAL HIGH (ref 15–41)
Albumin: 2.1 g/dL — ABNORMAL LOW (ref 3.5–5.0)
Alkaline Phosphatase: 1156 U/L — ABNORMAL HIGH (ref 38–126)
Anion gap: 11 (ref 5–15)
BUN: 14 mg/dL (ref 6–20)
CO2: 22 mmol/L (ref 22–32)
Calcium: 8.3 mg/dL — ABNORMAL LOW (ref 8.9–10.3)
Chloride: 100 mmol/L (ref 98–111)
Creatinine, Ser: 0.63 mg/dL (ref 0.61–1.24)
GFR, Estimated: >60 mL/min (ref >60)
Glucose, Bld: 104 mg/dL — ABNORMAL HIGH (ref 70–99)
Potassium: 4.4 mmol/L (ref 3.5–5.1)
Sodium: 133 mmol/L — ABNORMAL LOW (ref 135–145)
Total Bilirubin: 0.5 mg/dL (ref 0.0–1.2)
Total Protein: 6.9 g/dL (ref 6.5–8.1)

## 2023-11-09 LAB — PHOSPHORUS: Phosphorus: 3.2 mg/dL (ref 2.5–4.6)

## 2023-11-09 LAB — CBC WITH DIFFERENTIAL/PLATELET
Abs Immature Granulocytes: 1.17 10*3/uL — ABNORMAL HIGH (ref 0.00–0.07)
Basophils Absolute: 0.1 10*3/uL (ref 0.0–0.1)
Basophils Relative: 0 %
Eosinophils Absolute: 0 10*3/uL (ref 0.0–0.5)
Eosinophils Relative: 0 %
HCT: 26.7 % — ABNORMAL LOW (ref 39.0–52.0)
Hemoglobin: 8.2 g/dL — ABNORMAL LOW (ref 13.0–17.0)
Immature Granulocytes: 8 %
Lymphocytes Relative: 21 %
Lymphs Abs: 3 10*3/uL (ref 0.7–4.0)
MCH: 26.5 pg (ref 26.0–34.0)
MCHC: 30.7 g/dL (ref 30.0–36.0)
MCV: 86.1 fL (ref 80.0–100.0)
Monocytes Absolute: 0.7 10*3/uL (ref 0.1–1.0)
Monocytes Relative: 5 %
Neutro Abs: 9 10*3/uL — ABNORMAL HIGH (ref 1.7–7.7)
Neutrophils Relative %: 66 %
Platelets: 244 10*3/uL (ref 150–400)
RBC: 3.1 MIL/uL — ABNORMAL LOW (ref 4.22–5.81)
RDW: 18.2 % — ABNORMAL HIGH (ref 11.5–15.5)
WBC: 13.9 10*3/uL — ABNORMAL HIGH (ref 4.0–10.5)
nRBC: 0.4 % — ABNORMAL HIGH (ref 0.0–0.2)

## 2023-11-09 LAB — MAGNESIUM: Magnesium: 1.8 mg/dL (ref 1.7–2.4)

## 2023-11-09 MED ORDER — MORPHINE SULFATE ER 30 MG PO TBCR
30.0000 mg | EXTENDED_RELEASE_TABLET | Freq: Two times a day (BID) | ORAL | Status: DC
  Administered 2023-11-09 – 2023-11-10 (×2): 30 mg via ORAL
  Filled 2023-11-09 (×2): qty 1

## 2023-11-09 MED ORDER — HYDROMORPHONE 1 MG/ML IV SOLN
INTRAVENOUS | Status: DC
  Administered 2023-11-09: 5.25 mg via INTRAVENOUS
  Administered 2023-11-09: 30 mg via INTRAVENOUS
  Administered 2023-11-10 (×2): 3 mg via INTRAVENOUS
  Administered 2023-11-10: 3.75 mg via INTRAVENOUS
  Administered 2023-11-10: 8.25 mg via INTRAVENOUS
  Administered 2023-11-10: 5.25 mg via INTRAVENOUS
  Administered 2023-11-11 (×2): 3 mg via INTRAVENOUS
  Administered 2023-11-11: 9 mg via INTRAVENOUS
  Administered 2023-11-12: 3.75 mg via INTRAVENOUS
  Administered 2023-11-12: 4.5 mg via INTRAVENOUS
  Administered 2023-11-12: 10.5 mg via INTRAVENOUS
  Administered 2023-11-12: 7.5 mg via INTRAVENOUS
  Administered 2023-11-12: 5.25 mg via INTRAVENOUS
  Administered 2023-11-13: 2.25 mg via INTRAVENOUS
  Administered 2023-11-13: 30 mg via INTRAVENOUS
  Administered 2023-11-13: 3.75 mg via INTRAVENOUS
  Administered 2023-11-14: 4.5 mg via INTRAVENOUS
  Administered 2023-11-14 (×2): 3 mg via INTRAVENOUS
  Administered 2023-11-14 – 2023-11-15 (×2): 30 mg via INTRAVENOUS
  Administered 2023-11-17: 2.25 mg via INTRAVENOUS
  Administered 2023-11-17: 30 mg via INTRAVENOUS
  Administered 2023-11-18: 3 mg via INTRAVENOUS
  Administered 2023-11-18: 2.25 mg via INTRAVENOUS
  Administered 2023-11-18: 0.75 mg via INTRAVENOUS
  Administered 2023-11-18: 2.25 mg via INTRAVENOUS
  Administered 2023-11-18: 1.5 mg via INTRAVENOUS
  Administered 2023-11-18: 2.25 mg via INTRAVENOUS
  Administered 2023-11-19: 0.075 mL via INTRAVENOUS
  Administered 2023-11-19: 0.75 mL via INTRAVENOUS
  Administered 2023-11-19: 0.75 mg via INTRAVENOUS
  Administered 2023-11-19: 3 mg via INTRAVENOUS
  Administered 2023-11-19: 0.75 mg via INTRAVENOUS
  Administered 2023-11-19: 0.75 mL via INTRAVENOUS
  Administered 2023-11-20 (×2): 1.5 mg via INTRAVENOUS
  Administered 2023-11-20: 30 mg via INTRAVENOUS
  Administered 2023-11-21 (×2): 0.75 mL via INTRAVENOUS
  Administered 2023-11-21: 2.25 mg via INTRAVENOUS
  Administered 2023-11-21: 0.75 mL via INTRAVENOUS
  Administered 2023-11-21: 3 mg via INTRAVENOUS
  Administered 2023-11-21: 1.5 mg via INTRAVENOUS
  Administered 2023-11-22: 30 mg via INTRAVENOUS
  Administered 2023-11-22: 6 mg via INTRAVENOUS
  Administered 2023-11-22: 2.25 mg via INTRAVENOUS
  Administered 2023-11-22: 1.5 mg via INTRAVENOUS
  Administered 2023-11-22: 4.5 mg via INTRAVENOUS
  Administered 2023-11-23: 1.5 mg via INTRAVENOUS
  Administered 2023-11-23: 3 mg via INTRAVENOUS
  Administered 2023-11-23 (×2): 0.75 mg via INTRAVENOUS
  Filled 2023-11-09 (×9): qty 30

## 2023-11-09 NOTE — Progress Notes (Signed)
 Joseph Haney   DOB:1976/08/14   JW#:[id number]   FAO#:[id number]  Medical oncology follow-up note  Subjective: Patient is well-known to me, under my care for his metastatic colon cancer.  But he has not been able to follow-up with me in the office since mid December 2024.  He is overall condition has deteriorated significantly in the past few months, with worsening body pains, anorexia, and has been spending most time in bed.    Objective:  Vitals:   11/09/23 1232 11/09/23 1928  BP:    Pulse:    Resp: 18 18  Temp:    SpO2: 99% 98%    Body mass index is 21.28 kg/m.  Intake/Output Summary (Last 24 hours) at 11/09/2023 1938 Last data filed at 11/09/2023 1759 Gross per 24 hour  Intake --  Output 1100 ml  Net -1100 ml     Sclerae unicteric  Oropharynx clear  MSK no focal spinal tenderness, no peripheral edema  Neuro nonfocal   CBG (last 3)  Recent Labs    11/07/23 1759  GLUCAP 283*     Labs:  Lab Results  Component Value Date   WBC 13.9 (H) 11/09/2023   HGB 8.2 (L) 11/09/2023   HCT 26.7 (L) 11/09/2023   MCV 86.1 11/09/2023   PLT 244 11/09/2023   NEUTROABS 9.0 (H) 11/09/2023      Urine Studies No results for input(s): "UHGB", "CRYS" in the last 72 hours.  Invalid input(s): "UACOL", "UAPR", "USPG", "UPH", "UTP", "UGL", "UKET", "UBIL", "UNIT", "UROB", "ULEU", "UEPI", "UWBC", "URBC", "UBAC", "CAST", "UCOM", "BILUA"  Basic Metabolic Panel: Recent Labs  Lab 11/06/23 1537 11/07/23 0524 11/08/23 0454 11/09/23 0538  NA 135 136 135 133*  K 3.4* 3.9 4.5 4.4  CL 104 103 103 100  CO2 20* 21* 22 22  GLUCOSE 91 117* 111* 104*  BUN 14 11 18 14   CREATININE 0.53* 0.58* 0.83 0.63  CALCIUM 8.8* 8.6* 8.4* 8.3*  MG  --   --  1.8 1.8  PHOS  --   --  3.5 3.2   GFR Estimated Creatinine Clearance: 88.8 mL/min (by C-G formula based on SCr of 0.63 mg/dL). Liver Function Tests: Recent Labs  Lab 11/06/23 1537 11/07/23 0524 11/08/23 0454 11/09/23 0538  AST 138* 127*  133* 121*  ALT 96* 76* 72* 60*  ALKPHOS 956* 1,070* 973* 1,156*  BILITOT 0.8 0.7 0.5 0.5  PROT 8.1 7.4 7.0 6.9  ALBUMIN 2.6* 2.5* 2.2* 2.1*   No results for input(s): "LIPASE", "AMYLASE" in the last 168 hours. No results for input(s): "AMMONIA" in the last 168 hours. Coagulation profile No results for input(s): "INR", "PROTIME" in the last 168 hours.  CBC: Recent Labs  Lab 11/06/23 1537 11/07/23 0524 11/08/23 0454 11/09/23 0538  WBC 16.2* 14.4* 13.3* 13.9*  NEUTROABS 11.6*  --  11.0* 9.0*  HGB 8.9* 8.2* 8.0* 8.2*  HCT 30.0* 27.0* 26.2* 26.7*  MCV 87.0 87.4 85.9 86.1  PLT 231 209 226 244   Cardiac Enzymes: No results for input(s): "CKTOTAL", "CKMB", "CKMBINDEX", "TROPONINI" in the last 168 hours. BNP: Invalid input(s): "POCBNP" CBG: Recent Labs  Lab 11/07/23 1759  GLUCAP 283*   D-Dimer No results for input(s): "DDIMER" in the last 72 hours. Hgb A1c No results for input(s): "HGBA1C" in the last 72 hours. Lipid Profile No results for input(s): "CHOL", "HDL", "LDLCALC", "TRIG", "CHOLHDL", "LDLDIRECT" in the last 72 hours. Thyroid function studies No results for input(s): "TSH", "T4TOTAL", "T3FREE", "THYROIDAB" in the last 72 hours.  Invalid  input(s): "FREET3" Anemia work up No results for input(s): "VITAMINB12", "FOLATE", "FERRITIN", "TIBC", "IRON", "RETICCTPCT" in the last 72 hours. Microbiology Recent Results (from the past 240 hours)  Resp panel by RT-PCR (RSV, Flu A&B, Covid) Anterior Nasal Swab     Status: None   Collection Time: 11/06/23  3:23 PM   Specimen: Anterior Nasal Swab  Result Value Ref Range Status   SARS Coronavirus 2 by RT PCR NEGATIVE NEGATIVE Final    Comment: (NOTE) SARS-CoV-2 target nucleic acids are NOT DETECTED.  The SARS-CoV-2 RNA is generally detectable in upper respiratory specimens during the acute phase of infection. The lowest concentration of SARS-CoV-2 viral copies this assay can detect is 138 copies/mL. A negative result does  not preclude SARS-Cov-2 infection and should not be used as the sole basis for treatment or other patient management decisions. A negative result may occur with  improper specimen collection/handling, submission of specimen other than nasopharyngeal swab, presence of viral mutation(s) within the areas targeted by this assay, and inadequate number of viral copies(<138 copies/mL). A negative result must be combined with clinical observations, patient history, and epidemiological information. The expected result is Negative.  Fact Sheet for Patients:  BloggerCourse.com  Fact Sheet for Healthcare Providers:  SeriousBroker.it  This test is no t yet approved or cleared by the [place] FDA and  has been authorized for detection and/or diagnosis of SARS-CoV-2 by FDA under an Emergency Use Authorization (EUA). This EUA will remain  in effect (meaning this test can be used) for the duration of the COVID-19 declaration under Section 564(b)(1) of the Act, 21 U.S.C.section 360bbb-3(b)(1), unless the authorization is terminated  or revoked sooner.       Influenza A by PCR NEGATIVE NEGATIVE Final   Influenza B by PCR NEGATIVE NEGATIVE Final    Comment: (NOTE) The Xpert Xpress SARS-CoV-2/FLU/RSV plus assay is intended as an aid in the diagnosis of influenza from Nasopharyngeal swab specimens and should not be used as a sole basis for treatment. Nasal washings and aspirates are unacceptable for Xpert Xpress SARS-CoV-2/FLU/RSV testing.  Fact Sheet for Patients: BloggerCourse.com  Fact Sheet for Healthcare Providers: SeriousBroker.it  This test is not yet approved or cleared by the [place] FDA and has been authorized for detection and/or diagnosis of SARS-CoV-2 by FDA under an Emergency Use Authorization (EUA). This EUA will remain in effect (meaning this test can be used) for the  duration of the COVID-19 declaration under Section 564(b)(1) of the Act, 21 U.S.C. section 360bbb-3(b)(1), unless the authorization is terminated or revoked.     Resp Syncytial Virus by PCR NEGATIVE NEGATIVE Final    Comment: (NOTE) Fact Sheet for Patients: BloggerCourse.com  Fact Sheet for Healthcare Providers: SeriousBroker.it  This test is not yet approved or cleared by the [place] FDA and has been authorized for detection and/or diagnosis of SARS-CoV-2 by FDA under an Emergency Use Authorization (EUA). This EUA will remain in effect (meaning this test can be used) for the duration of the COVID-19 declaration under Section 564(b)(1) of the Act, 21 U.S.C. section 360bbb-3(b)(1), unless the authorization is terminated or revoked.  Performed at [facility], [address], [place], [place] [address]       Studies:  No results found.  Assessment: 48 y.o. male   Metastatic colon cancer Cancer related pain, anorexia, and weakness Anemia and leukocytosis Hypertension Transaminitis Severe protein and calorie malnutrition Depression    Plan:  -Patient has not followed with [place] in office for  the past 3 months, due to his worsening cancer related symptoms, weakness and non-compliance  He is also depressed. -Due to the patient's overall malnutrition and poor performance status, I do not think he is a candidate for chemotherapy.  I reviewed his recent CT scan findings, and his treatment course, and discussed the overall very poor prognosis.  He is not expectancy is likely a few months. I Have explained this repeatedly to patient and his mother and maternal aunt.  However patient still wants to fight with the cancer, and try chemotherapy.  -After lengthy discussion, patient and his family agreed with palliative care goal and will focus on his pain and other symptom management while in hospital. -I discussed  CODE STATUS with patient and his family, patient agreed with DNR and this is witnessed by his mother. -I recommend home hospice service on discharge.  Patient initially was not open to hospice, I spent some time explaining to him about hospice covers, and told him okay to withdraw from hospice if he does get better, and if he is able to return to my office for follow-up.  At the end of the conversation, he seemed to be open to considering hospice.  I will follow-up in a few days. -Symptom management per palliative care team.  Due to the diffuse pain, I do not recommend palliative radiation at this point. -I spent a total of 50 minutes for his visit today, more than 50% time on face-to-face counseling.   Malachy Mood, MD 11/09/2023

## 2023-11-09 NOTE — Progress Notes (Signed)
 Daily Progress Note   Patient Name: Joseph Haney       Date: 11/09/2023 DOB: 1975-11-07  Age: 48 y.o. MRN#: [id number] Attending Physician: Zigmund Daniel., * Primary Care Physician: Frederic Jericho, [name] Admit Date: 11/06/2023  Reason for Consultation/Follow-up: Non pain symptom management and Pain control  Subjective:  Complains of 8-9/10 pain in the past 24 hours, mother and sister at bedside.   Length of Stay: 3  Current Medications: Scheduled Meds:   acetaminophen  1,000 mg Oral Q8H   carvedilol  25 mg Oral BID WC   dexamethasone  4 mg Intravenous Q24H   enoxaparin (LOVENOX) injection  40 mg Subcutaneous Q24H   feeding supplement  237 mL Oral BID BM   HYDROmorphone   Intravenous Q4H   megestrol  400 mg Oral Daily   morphine  30 mg Oral Q12H   pantoprazole  40 mg Oral Daily   potassium chloride SA  20 mEq Oral Daily    Continuous Infusions:   PRN Meds: naloxone **AND** sodium chloride flush, ondansetron (ZOFRAN) IV, ondansetron **OR** [DISCONTINUED] ondansetron (ZOFRAN) IV  Physical Exam         Awake alert Generalized pain Regular work of breathing No edema  Vital Signs: BP 116/85 (BP Location: Right Arm)   Pulse (!) 104   Temp 97.7 F (36.5 C) (Oral)   Resp 16   Ht 5\' 4"  (1.626 m)   Wt 56.2 kg   SpO2 100%   BMI 21.28 kg/m  SpO2: SpO2: 100 % O2 Device: O2 Device: Nasal Cannula O2 Flow Rate: O2 Flow Rate (L/min): 2 L/min  Intake/output summary:  Intake/Output Summary (Last 24 hours) at 11/09/2023 1157 Last data filed at 11/09/2023 0200 Gross per 24 hour  Intake --  Output 300 ml  Net -300 ml   LBM:   Baseline Weight: Weight: 56.2 kg Most recent weight: Weight: 56.2 kg       Palliative Assessment/Data:      Patient Active  Problem List   Diagnosis Date Noted   Cancer associated pain 11/06/2023   Dehydration 01/07/2023   Port-A-Cath in place 11/17/2022   Genetic testing 09/18/2022   Sciatica 05/03/2022   Cholelithiasis 05/02/2022   Iron deficiency anemia due to chronic blood loss 07/24/2021   Cancer of right colon (HCC) 07/24/2021  Colonic mass 07/19/2021   Enteritis 11/26/2015   Hypomagnesemia 11/26/2015   Rectal bleeding 11/26/2015   Urinary tract infectious disease    Hypokalemia 10/10/2015   Alcoholic ketoacidosis 05/21/2015   Hyponatremia 05/21/2015   Concussion 05/21/2015   C. difficile diarrhea 05/21/2013   Diarrhea 05/20/2013   Protein-calorie malnutrition, severe (HCC) 05/20/2013   Abnormality of gait 12/08/2012   Neck pain 10/21/2012   Back pain 10/21/2012   Temporomandibular subluxation 07/25/2012    Palliative Care Assessment & Plan   Patient Profile:    Assessment:  Generalized pain 48 yo with metastatic colon cancer Cancer related pain   Recommendations/Plan: Dilaudid PCA 0.75 mg PCA Q 10 min on bolus dose PCA. Also has loading doses.  MS Contin 30 mg PO BID. Continue current pain and non-pain regimen.  Full code, full scope, discussed with patient, mother and sister who were at bedside.    Goals of Care and Additional Recommendations: Limitations on Scope of Treatment: Full Scope Treatment  Code Status:    Code Status Orders  (From admission, onward)           Start     Ordered   11/06/23 2229  Full code  Continuous       Question:  By:  Answer:  Consent: discussion documented in EHR   11/06/23 2229           Code Status History     Date Active Date Inactive Code Status Order ID Comments User Context   01/07/2023 0532 01/10/2023 1524 Full Code [id number]  Angie Fava, DO ED   05/03/2022 0030 05/07/2022 1626 Full Code [id number]  Rometta Emery, MD ED   07/20/2021 1717 08/01/2021 1947 Full Code [id number]  Azucena Fallen, MD Inpatient    11/26/2015 0032 11/30/2015 2056 Full Code [id number]  Bobette Mo, MD Inpatient   10/10/2015 1734 10/12/2015 1505 Full Code [id number]  Calvert Cantor, MD ED   05/21/2015 1040 05/22/2015 1933 Full Code [id number]  Ron Parker, MD Inpatient   05/20/2013 0242 05/23/2013 1601 Full Code [id number]  Houston Siren, MD Inpatient   10/12/2012 0537 10/13/2012 1706 Full Code [id number]  Manuela Schwartz Inpatient   07/25/2012 1645 07/27/2012 1726 Full Code [id number]  Artelia Laroche, RN Inpatient       Prognosis:  Unable to determine  Discharge Planning: To Be Determined  Care plan was discussed with patient, mother and sister.   Thank you for allowing the Palliative Medicine Team to assist in the care of this patient.  Mod MDM.      Greater than 50%  of this time was spent counseling and coordinating care related to the above assessment and plan.  Rosalin Hawking, MD  Please contact Palliative Medicine Team phone at [phone] for questions and concerns.

## 2023-11-09 NOTE — Progress Notes (Signed)
 PROGRESS NOTE    Joseph Haney  ION:[id number] DOB: May 06, 1976 DOA: 11/06/2023 PCP: Joseph Jericho, [name]  Chief Complaint  Patient presents with   Shortness of Breath   Weakness    Brief Narrative:   48 yo with hx metastatic colon cancer presenting with cancer related pain.   Assessment & Plan:   Principal Problem:   Cancer associated pain Active Problems:   Hypokalemia   Cancer of right colon (HCC)   Protein-calorie malnutrition, severe (HCC)  Cancer Related Pain Metastatic Colon Cancer At home takes oxycodone prn (15 mg he notes 4-6 times Joseph Haney day).  Recently started on fentanyl patch. Pain uncontrolled - continue PCA, long acting morphine - appreciate palliative care assistance - they're adjusting as needed Dexamethasone per oncology CT with new and enlarged pulmonary nodules.  Enlargement of hypodense metastasis of medial liver dome with multiple new small satellite metastases.  Diffusely heterogeneous mixed lytic and sclerotic osseous metastatic disease throughout the axial skeleton.  Worsened widespread metastatic disease. Dr. Mosetta Putt to see today, appreciate assistance  Hypokalemia  Trend  Hypertension Coreg  Elevated LFT's Related to metastatic disease  Leukocytosis Suspected related to malignancy, trend  Anemia Will trend   Severe Protein Calorie Malnutrition RD megace     DVT prophylaxis: lovenox Code Status: full Family Communication: mother at bedside Disposition:   Status is: Inpatient Remains inpatient appropriate because: pain controlled   Consultants:  Oncology Palliative care  Procedures:  none  Antimicrobials:  Anti-infectives (From admission, onward)    None       Subjective: Continued pain  Objective: Vitals:   11/09/23 0656 11/09/23 0747 11/09/23 1208 11/09/23 1232  BP:  116/85 105/70   Pulse:  (!) 104 97   Resp: 18 16 16 18   Temp:  97.7 F (36.5 C) 98 F (36.7 C)   TempSrc:  Oral Oral   SpO2: 99% 100%  100% 99%  Weight:      Height:        Intake/Output Summary (Last 24 hours) at 11/09/2023 1347 Last data filed at 11/09/2023 0200 Gross per 24 hour  Intake --  Output 300 ml  Net -300 ml   Filed Weights   11/06/23 1450  Weight: 56.2 kg    Examination:  General: No acute distress. Cardiovascular: RRR Lungs: unlabored Abdomen: Soft, nontender, nondistended  Neurological: Alert. Moves all extremities 4 with equal strength. Cranial nerves II through XII grossly intact. Extremities: No clubbing or cyanosis. No edema.  Data Reviewed: I have personally reviewed following labs and imaging studies  CBC: Recent Labs  Lab 11/06/23 1537 11/07/23 0524 11/08/23 0454 11/09/23 0538  WBC 16.2* 14.4* 13.3* 13.9*  NEUTROABS 11.6*  --  11.0* 9.0*  HGB 8.9* 8.2* 8.0* 8.2*  HCT 30.0* 27.0* 26.2* 26.7*  MCV 87.0 87.4 85.9 86.1  PLT 231 209 226 244    Basic Metabolic Panel: Recent Labs  Lab 11/06/23 1537 11/07/23 0524 11/08/23 0454 11/09/23 0538  NA 135 136 135 133*  K 3.4* 3.9 4.5 4.4  CL 104 103 103 100  CO2 20* 21* 22 22  GLUCOSE 91 117* 111* 104*  BUN 14 11 18 14   CREATININE 0.53* 0.58* 0.83 0.63  CALCIUM 8.8* 8.6* 8.4* 8.3*  MG  --   --  1.8 1.8  PHOS  --   --  3.5 3.2    GFR: Estimated Creatinine Clearance: 88.8 mL/min (by C-G formula based on SCr of 0.63 mg/dL).  Liver Function Tests: Recent Labs  Lab  11/06/23 1537 11/07/23 0524 11/08/23 0454 11/09/23 0538  AST 138* 127* 133* 121*  ALT 96* 76* 72* 60*  ALKPHOS 956* 1,070* 973* 1,156*  BILITOT 0.8 0.7 0.5 0.5  PROT 8.1 7.4 7.0 6.9  ALBUMIN 2.6* 2.5* 2.2* 2.1*    CBG: Recent Labs  Lab 11/07/23 1759  GLUCAP 283*     Recent Results (from the past 240 hours)  Resp panel by RT-PCR (RSV, Flu Joseph Haney&B, Covid) Anterior Nasal Swab     Status: None   Collection Time: 11/06/23  3:23 PM   Specimen: Anterior Nasal Swab  Result Value Ref Range Status   SARS Coronavirus 2 by RT PCR NEGATIVE NEGATIVE Final     Comment: (NOTE) SARS-CoV-2 target nucleic acids are NOT DETECTED.  The SARS-CoV-2 RNA is generally detectable in upper respiratory specimens during the acute phase of infection. The lowest concentration of SARS-CoV-2 viral copies this assay can detect is 138 copies/mL. Joseph Haney negative result does not preclude SARS-Cov-2 infection and should not be used as the sole basis for treatment or other patient management decisions. Joseph Haney negative result may occur with  improper specimen collection/handling, submission of specimen other than nasopharyngeal swab, presence of viral mutation(s) within the areas targeted by this assay, and inadequate number of viral copies(<138 copies/mL). Joseph Haney negative result must be combined with clinical observations, patient history, and epidemiological information. The expected result is Negative.  Fact Sheet for Patients:  BloggerCourse.com  Fact Sheet for Healthcare Providers:  SeriousBroker.it  This test is no t yet approved or cleared by the [place] FDA and  has been authorized for detection and/or diagnosis of SARS-CoV-2 by FDA under an Emergency Use Authorization (EUA). This EUA will remain  in effect (meaning this test can be used) for the duration of the COVID-19 declaration under Section 564(b)(1) of the Act, 21 U.S.C.section 360bbb-3(b)(1), unless the authorization is terminated  or revoked sooner.       Influenza Joseph Haney by PCR NEGATIVE NEGATIVE Final   Influenza B by PCR NEGATIVE NEGATIVE Final    Comment: (NOTE) The Xpert Xpress SARS-CoV-2/FLU/RSV plus assay is intended as an aid in the diagnosis of influenza from Nasopharyngeal swab specimens and should not be used as Joseph Haney sole basis for treatment. Nasal washings and aspirates are unacceptable for Xpert Xpress SARS-CoV-2/FLU/RSV testing.  Fact Sheet for Patients: BloggerCourse.com  Fact Sheet for Healthcare  Providers: SeriousBroker.it  This test is not yet approved or cleared by the [place] FDA and has been authorized for detection and/or diagnosis of SARS-CoV-2 by FDA under an Emergency Use Authorization (EUA). This EUA will remain in effect (meaning this test can be used) for the duration of the COVID-19 declaration under Section 564(b)(1) of the Act, 21 U.S.C. section 360bbb-3(b)(1), unless the authorization is terminated or revoked.     Resp Syncytial Virus by PCR NEGATIVE NEGATIVE Final    Comment: (NOTE) Fact Sheet for Patients: BloggerCourse.com  Fact Sheet for Healthcare Providers: SeriousBroker.it  This test is not yet approved or cleared by the [place] FDA and has been authorized for detection and/or diagnosis of SARS-CoV-2 by FDA under an Emergency Use Authorization (EUA). This EUA will remain in effect (meaning this test can be used) for the duration of the COVID-19 declaration under Section 564(b)(1) of the Act, 21 U.S.C. section 360bbb-3(b)(1), unless the authorization is terminated or revoked.  Performed at [facility], [address], [place], [place] [address]          Radiology Studies: No  results found.       Scheduled Meds:  acetaminophen  1,000 mg Oral Q8H   carvedilol  25 mg Oral BID WC   dexamethasone  4 mg Intravenous Q24H   enoxaparin (LOVENOX) injection  40 mg Subcutaneous Q24H   feeding supplement  237 mL Oral BID BM   HYDROmorphone   Intravenous Q4H   megestrol  400 mg Oral Daily   morphine  30 mg Oral Q12H   pantoprazole  40 mg Oral Daily   potassium chloride SA  20 mEq Oral Daily   Continuous Infusions:     LOS: 3 days    Time spent: over 30 min    Lacretia Nicks, MD [facility]   To contact the attending provider between 7A-7P or the covering provider during after hours 7P-7A, please log into the web site  [email or web address] and access using universal [place] password for that web site. If you do not have the password, please call the hospital operator.  11/09/2023, 1:47 PM

## 2023-11-09 NOTE — Plan of Care (Signed)

## 2023-11-09 NOTE — Progress Notes (Signed)
 Initial Nutrition Assessment  DOCUMENTATION CODES:   Non-severe (moderate) malnutrition in context of chronic illness  INTERVENTION:  - Regular diet.  - Ensure Plus High Protein po BID, each supplement provides 350 kcal and 20 grams of protein. - Encourage intake of frequent meals/snacks throughout the day in addition to Ensure.  - Monitor weight trends.   NUTRITION DIAGNOSIS:   Moderate Malnutrition related to chronic illness, cancer and cancer related treatments as evidenced by moderate fat depletion, moderate muscle depletion, percent weight loss (32% in 7 months).  GOAL:   Patient will meet greater than or equal to 90% of their needs  MONITOR:   PO intake, Supplement acceptance, Weight trends  REASON FOR ASSESSMENT:   Consult Assessment of nutrition requirement/status  ASSESSMENT:   48 y.o. male with PMH significant of metastatic colon cancer including to the liver previously on chemotherapy and s/p partial colectomy, prior history of alcohol abuse, seizure disorder, GERD who presented with significant pain.   Patient reports a UBW of 192# and steady weight loss over the past 7-8 months due to cancer, pain, and eating less.  Per EMR, patient weighed at 182# in August and now weighed at 124#. This is a 58# or 32% weight loss in 7 months, which is significant and severe for the time frame.   Patient endorses that he would normally eat 1-2 meals a day. Over the past several months he has been eating less, maybe only a few snacks a day. Notes his appetite is actually okay but he has been limited by pain.  His pain continues to be a major hindrance of his intake. Notes he is always at a state of 10/10 pain and the pain medication only get its down to 9/10. He reports trying to eat since admission but often times can only get a little in before the pain comes back.  He has been refusing Ensures since admission. Patient inquiring if they have too much sugar and he should be  worried about that. Dicussed that the Ensures can provide vital calories and protein to support meeting increased needs for cancer, especially given that he has already lost so much weight.  Enocurgaed patient to eat small and frequent meals during the day in addition to Ensure to increase intake. Also encouraged patient to time out meals around pain medication administration so he is having less pain when meals arrive. Patient endorsed understanding, appreciative of care.   Medications reviewed and include: Decadron, Megace  Labs reviewed:  Na 133   NUTRITION - FOCUSED PHYSICAL EXAM:  Flowsheet Row Most Recent Value  Orbital Region Mild depletion  Upper Arm Region Moderate depletion  Thoracic and Lumbar Region Moderate depletion  Buccal Region Moderate depletion  Temple Region Mild depletion  Clavicle Bone Region Mild depletion  Clavicle and Acromion Bone Region Mild depletion  Scapular Bone Region Unable to assess  Dorsal Hand Mild depletion  Patellar Region Moderate depletion  Anterior Thigh Region Moderate depletion  Posterior Calf Region Moderate depletion  Edema (RD Assessment) None  Hair Reviewed  Eyes Reviewed  Mouth Reviewed  Skin Reviewed  Nails Reviewed       Diet Order:   Diet Order             Diet regular Room service appropriate? Yes; Fluid consistency: Thin  Diet effective now                   EDUCATION NEEDS:  Education needs have been addressed  Skin:  Skin Assessment: Reviewed RN Assessment  Last BM:  unknown  Height:  Ht Readings from Last 1 Encounters:  11/06/23 5\' 4"  (1.626 m)   Weight:  Wt Readings from Last 1 Encounters:  11/06/23 56.2 kg    BMI:  Body mass index is 21.28 kg/m.  Estimated Nutritional Needs:  Kcal:  1950-2150 kcals Protein:  85-100 grams Fluid:  >/= 2L    Shelle Iron RD, LDN Contact via Secure Chat.

## 2023-11-09 NOTE — Progress Notes (Signed)
 Bladder scan 731, pt in discomfort.  Notifying MD at this time

## 2023-11-09 NOTE — Progress Notes (Signed)
   11/09/23 0944  Readmission Prevention Plan - High Risk  Transportation Screening Complete  Home Care Consult (High Risk) Complete  High Risk Social Work Consult for recovery care planning/counseling (includes patient and caregiver) Complete  High Risk Palliative Care Screening Not Applicable  Medication Review Complete

## 2023-11-10 ENCOUNTER — Inpatient Hospital Stay (HOSPITAL_COMMUNITY)

## 2023-11-10 DIAGNOSIS — R531 Weakness: Secondary | ICD-10-CM

## 2023-11-10 DIAGNOSIS — C189 Malignant neoplasm of colon, unspecified: Secondary | ICD-10-CM

## 2023-11-10 DIAGNOSIS — Z7189 Other specified counseling: Secondary | ICD-10-CM

## 2023-11-10 DIAGNOSIS — Z515 Encounter for palliative care: Secondary | ICD-10-CM | POA: Diagnosis not present

## 2023-11-10 DIAGNOSIS — R630 Anorexia: Secondary | ICD-10-CM

## 2023-11-10 DIAGNOSIS — C7951 Secondary malignant neoplasm of bone: Secondary | ICD-10-CM

## 2023-11-10 DIAGNOSIS — E43 Unspecified severe protein-calorie malnutrition: Secondary | ICD-10-CM

## 2023-11-10 DIAGNOSIS — Z79899 Other long term (current) drug therapy: Secondary | ICD-10-CM

## 2023-11-10 DIAGNOSIS — G893 Neoplasm related pain (acute) (chronic): Secondary | ICD-10-CM | POA: Diagnosis not present

## 2023-11-10 DIAGNOSIS — R4589 Other symptoms and signs involving emotional state: Secondary | ICD-10-CM

## 2023-11-10 LAB — CBC WITH DIFFERENTIAL/PLATELET
Abs Immature Granulocytes: 1.2 10*3/uL — ABNORMAL HIGH (ref 0.00–0.07)
Basophils Absolute: 0.1 10*3/uL (ref 0.0–0.1)
Basophils Relative: 0 %
Eosinophils Absolute: 0 10*3/uL (ref 0.0–0.5)
Eosinophils Relative: 0 %
HCT: 27 % — ABNORMAL LOW (ref 39.0–52.0)
Hemoglobin: 8 g/dL — ABNORMAL LOW (ref 13.0–17.0)
Immature Granulocytes: 8 %
Lymphocytes Relative: 19 %
Lymphs Abs: 2.7 10*3/uL (ref 0.7–4.0)
MCH: 26.2 pg (ref 26.0–34.0)
MCHC: 29.6 g/dL — ABNORMAL LOW (ref 30.0–36.0)
MCV: 88.5 fL (ref 80.0–100.0)
Monocytes Absolute: 0.8 10*3/uL (ref 0.1–1.0)
Monocytes Relative: 6 %
Neutro Abs: 9.7 10*3/uL — ABNORMAL HIGH (ref 1.7–7.7)
Neutrophils Relative %: 67 %
Platelets: 238 10*3/uL (ref 150–400)
RBC: 3.05 MIL/uL — ABNORMAL LOW (ref 4.22–5.81)
RDW: 18.1 % — ABNORMAL HIGH (ref 11.5–15.5)
WBC: 14.4 10*3/uL — ABNORMAL HIGH (ref 4.0–10.5)
nRBC: 0.4 % — ABNORMAL HIGH (ref 0.0–0.2)

## 2023-11-10 LAB — COMPREHENSIVE METABOLIC PANEL
ALT: 58 U/L — ABNORMAL HIGH (ref 0–44)
AST: 121 U/L — ABNORMAL HIGH (ref 15–41)
Albumin: 2.1 g/dL — ABNORMAL LOW (ref 3.5–5.0)
Alkaline Phosphatase: 1285 U/L — ABNORMAL HIGH (ref 38–126)
Anion gap: 12 (ref 5–15)
BUN: 20 mg/dL (ref 6–20)
CO2: 22 mmol/L (ref 22–32)
Calcium: 8.4 mg/dL — ABNORMAL LOW (ref 8.9–10.3)
Chloride: 99 mmol/L (ref 98–111)
Creatinine, Ser: 0.79 mg/dL (ref 0.61–1.24)
GFR, Estimated: >60 mL/min (ref >60)
Glucose, Bld: 114 mg/dL — ABNORMAL HIGH (ref 70–99)
Potassium: 4.6 mmol/L (ref 3.5–5.1)
Sodium: 133 mmol/L — ABNORMAL LOW (ref 135–145)
Total Bilirubin: 0.8 mg/dL (ref 0.0–1.2)
Total Protein: 7.3 g/dL (ref 6.5–8.1)

## 2023-11-10 LAB — MAGNESIUM: Magnesium: 2 mg/dL (ref 1.7–2.4)

## 2023-11-10 MED ORDER — OLANZAPINE 5 MG PO TABS
2.5000 mg | ORAL_TABLET | Freq: Every day | ORAL | Status: DC
  Administered 2023-11-10 – 2023-11-11 (×2): 2.5 mg via ORAL
  Filled 2023-11-10 (×2): qty 1

## 2023-11-10 MED ORDER — MORPHINE SULFATE ER 30 MG PO TBCR
60.0000 mg | EXTENDED_RELEASE_TABLET | Freq: Every day | ORAL | Status: DC
  Administered 2023-11-10: 60 mg via ORAL
  Filled 2023-11-10: qty 2

## 2023-11-10 MED ORDER — CHLORHEXIDINE GLUCONATE CLOTH 2 % EX PADS
6.0000 | MEDICATED_PAD | Freq: Every day | CUTANEOUS | Status: DC
  Administered 2023-11-10 – 2023-12-03 (×23): 6 via TOPICAL

## 2023-11-10 MED ORDER — MORPHINE SULFATE ER 30 MG PO TBCR
30.0000 mg | EXTENDED_RELEASE_TABLET | ORAL | Status: DC
  Administered 2023-11-11: 30 mg via ORAL
  Filled 2023-11-10: qty 1

## 2023-11-10 NOTE — Plan of Care (Signed)

## 2023-11-10 NOTE — Plan of Care (Signed)

## 2023-11-10 NOTE — Progress Notes (Signed)
 Daily Progress Note   Patient Name: Joseph Haney       Date: 11/10/2023 DOB: 22-Jul-1976  Age: 48 y.o. MRN#: [id number] Attending Physician: Zigmund Daniel., * Primary Care Physician: Frederic Jericho, [name] Admit Date: 11/06/2023 Length of Stay: 4 days  Reason for Consultation/Follow-up: Non pain symptom management and Pain control  Subjective:   CC: Patient continuing to have diffuse pain in setting of metastatic cancer to bone.  Palliative medicine team following up regarding symptom management.  Subjective:  Reviewed EMR prior to presenting to bedside.  At time of EMR review in past 24 hours patient has received IV Dilaudid via PCA dosed at 0.75 mg every 10 minute as needed x 47 boluses.  Patient also receiving MS Contin 30 mg every 12 hours scheduled and IV dexamethasone 4 mg daily. Oncologist, Dr. Mosetta Putt, met with patient and family on 11/09/2023.  Due to patient's current overall malnutrition and poor performance status, patient is not currently a candidate for chemotherapy.  Noted likely prognosis of a few months.  Patient did agree with changing CODE STATUS to DNR/DNI.  It was recommended that patient be discharged home with hospice with note that patient could withdraw from hospice if he improved and would be able to follow-up with oncology.  Presented to bedside to see patient.  Patient's brother present at bedside.  With permission, able to introduce myself as a member of the palliative medicine team.  Spent time following up on patient's diffuse bone pain.  Discussed patient's use of IV Dilaudid PCA.  Patient continues to voice that while of the dose of IV Dilaudid at 0.57 mg every 2 minute as needed does assist with pain management, added short lived.  Discussed idea of the PCA to determine patient's OME requirements to get him on longer acting medication and short acting oral medication that he could leave the hospital on.  Discussed based on patient's OME requirements in  the past 24 hours, recommend change of MS Contin dose to 30 mg at 8 AM, 30 mg at 2 PM, and 60 mg at 10 PM.  Patient will likely need increased doses though attempting to titrate slowly.  Patient denies any adverse effects from receiving morphine or IV Dilaudid including confusion or lethargy.  Patient noted he was able to speak to oncologist yesterday.  Patient heard recommendation about going home with hospice though he notes he still wants to receive chemotherapy.  Spent time explaining not wanting to cause patient further harm by giving chemotherapy in his currently weakened state.  Discussed how having a poor functional status and poor nutritional status if giving chemotherapy can cause more harm than benefit.  Patient noted he is motivated to improve his nutritional status.  Patient requesting to work with physical therapy for evaluation.  Noted can have physical therapy evaluate.  Discussed importance of nutritional aspects including high-protein foods.  Also spent time discussing how all cancer continues to evolve and use a person's nutrition to continue progressing as well.  Patient has stated that he wants to continue improving these aspects and does not want to go home with hospice at this time.  Noted would continue adjusting pain medications and continue appropriate medical interventions at this time.  Noted importance of continued discussions moving forward as want to support patient's quality of life.  Provided emotional support reactive listening.  All questions answered at time.  Noted palliative medicine team continue following with patient's medical journey.  Objective:   Vital Signs:  BP 110/70 (BP Location: Right Arm)   Pulse 97   Temp (!) 97.4 F (36.3 C) (Oral)   Resp 16   Ht 5\' 4"  (1.626 m)   Wt 56.2 kg   SpO2 99%   BMI 21.28 kg/m   Physical Exam: General: NAD, alert, cachectic, chronically ill-appearing Cardiovascular: RRR Respiratory: no increased work of breathing  noted, not in respiratory distress Neuro: A&Ox4, following commands easily Psych: appropriately answers all questions  Imaging:  I personally reviewed recent imaging.   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  Recent imaging has shown progression of the disease and patient's bone.  Oncology consulted for recommendations.  Palliative medicine team consulted to assist with symptom management.  Recommendations/Plan: # Complex medical decision making/goals of care:  - Discussed care with patient as detailed above in HPI.  Patient notes that he would like to attempt to regain strength and nutrition for continued chemotherapy in the outpatient setting.  Counseled patient on how nutritional status and functional status need to be at a certain level for a person to receive chemotherapy so that the chemotherapy does not outright cause him harm.  Patient wanting evaluation by physical therapy, have consulted.  Patient noted that home with hospice had been discussed.  Patient not currently wanting to go home with hospice; spent time explaining the benefits of hospice and symptom management and support and quality time so that if patient did nutritionally and functionally improve, could return to follow-up with Dr. Mosetta Putt regarding appropriateness of cancer directed therapies.  Noted importance of continued discussions moving forward.  Palliative medicine team to continue engaging as able.  -  Code Status: Limited: Do not attempt resuscitation (DNR) -DNR-LIMITED -Do Not Intubate/DNI   # Symptom management: Patient is receiving these palliative interventions for symptom management with an intent to improve quality of life.   -Pain, acutely severe in setting of metastatic colon cancer with mets to bone, liver, and  lung Within the past 24 hours hours, patient has required 35.25 mg of IV Dilaudid for opioid management. Based on OMEs calculated for this dose, which would be approximately 220 OME when reducing by 50% for incomplete cross tolerance, will appropriately start patient on the listed regimen below.    -Change MS Contin to 30 mg at 8 AM, 30 mg at 2 PM, and 60 mg at 10 PM   -Continue PCA with IV Dilaudid 0.75 mg every 10 minutes bolus.  No continuous infusion as on long-acting oral opioids.   -Continue IV dexamethasone 4 mg daily   -Mood/appetite   -Discontinued Megace due to concerns about hypercoagulability and adrenal insufficiency   -Personally reviewed EKG which noted QTc on 11/09/2023 to be 432   -Start olanzapine 2.5 mg nightly  # Psychosocial Support:  -Brother, mother  # Discharge Planning: To Be Determined  Discussed with: Hospitalist, oncologist, RN, patient, patient's brother present at bedside, pharmacy  Thank you for allowing the palliative care team to participate in the care Joseph Maduro.  Alvester Morin, DO Palliative Care Provider PMT # [phone]  If patient remains symptomatic despite maximum doses, please call PMT at [phone] between 0700 and 1900. Outside of these hours, please call attending, as PMT does not have night coverage.

## 2023-11-10 NOTE — Progress Notes (Signed)
 PROGRESS NOTE    Joseph Haney  ZOX:[id number] DOB: 1976-01-04 DOA: 11/06/2023 PCP: Frederic Jericho, [name]  Chief Complaint  Patient presents with   Shortness of Breath   Weakness    Brief Narrative:   48 yo with hx metastatic colon cancer presenting with cancer related pain.   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  Recently started on fentanyl patch. Pain uncontrolled - continue PCA, long acting morphine - appreciate palliative care assistance - they're adjusting as needed Dexamethasone per oncology CT with new and enlarged pulmonary nodules.  Enlargement of hypodense metastasis of medial liver dome with multiple new small satellite metastases.  Diffusely heterogeneous mixed lytic and sclerotic osseous metastatic disease throughout the axial skeleton.  Worsened widespread metastatic disease. Dr. Mosetta Putt saw 3/10 -> recommending hospice.  She notes life expectancy likely Caliana Spires few months.  Mr. Paradiso remains hopeful to "fight".  Appreciate palliative care eval.  He told palliative today, he wasn't interested in home with hospice at this time.    Bilateral Lower Extremity Weakness Will follow MRI T/L spine  Concerning for spine met   Hypokalemia  Trend  Hypertension Coreg  Elevated LFT's Related to metastatic disease  Leukocytosis Suspected related to malignancy, trend  Anemia Will trend   Severe Protein Calorie Malnutrition RD remeron     DVT prophylaxis:  lovenox Code Status: full Family Communication: mother at bedside Disposition:   Status is: Inpatient Remains inpatient appropriate because: pain controlled   Consultants:  Oncology Palliative care  Procedures:  none  Antimicrobials:  Anti-infectives (From admission, onward)    None       Subjective: Notes he lost ability to walk sometime after admission  Objective: Vitals:   11/10/23 1225 11/10/23 1227 11/10/23 1700 11/10/23 1743  BP: 100/66   111/64  Pulse: 89   94  Resp: 18 16 16    Temp: 97.7 F (36.5 C)   98.9 F (37.2 C)  TempSrc: Oral   Oral  SpO2: 100% 100% 99% 100%  Weight:      Height:        Intake/Output Summary (Last 24 hours) at 11/10/2023 1833 Last data filed at 11/10/2023 1801 Gross per 24 hour  Intake 568.25 ml  Output 200 ml  Net 368.25 ml   Filed Weights   11/06/23 1450  Weight: 56.2 kg    Examination:  General: No acute distress. Cardiovascular: RRR Lungs: unlabored Abdomen: Soft, nontender, nondistended Neurological:  2/5 bilateral LE strength, unable to clear heel off bed - no numbness to light touch, denies saddle anesthesia Extremities: No clubbing or cyanosis. No edema.  Data Reviewed: I have personally reviewed following labs and imaging studies  CBC: Recent Labs  Lab 11/06/23 1537 11/07/23 0524 11/08/23 0454 11/09/23 0538 11/10/23 0427  WBC 16.2* 14.4* 13.3* 13.9* 14.4*  NEUTROABS 11.6*  --  11.0* 9.0* 9.7*  HGB 8.9* 8.2* 8.0* 8.2* 8.0*  HCT 30.0* 27.0* 26.2* 26.7* 27.0*  MCV 87.0 87.4  85.9 86.1 88.5  PLT 231 209 226 244 238    Basic Metabolic Panel: Recent Labs  Lab 11/06/23 1537 11/07/23 0524 11/08/23 0454 11/09/23 0538 11/10/23 0427  NA 135 136 135 133* 133*  K 3.4* 3.9 4.5 4.4 4.6  CL 104 103 103 100 99  CO2 20* 21* 22 22 22   GLUCOSE 91 117* 111* 104* 114*  BUN 14 11 18 14 20   CREATININE 0.53* 0.58* 0.83 0.63 0.79  CALCIUM 8.8* 8.6* 8.4* 8.3* 8.4*  MG  --   --  1.8 1.8 2.0  PHOS  --   --  3.5  3.2  --     GFR: Estimated Creatinine Clearance: 88.8 mL/min (by C-G formula based on SCr of 0.79 mg/dL).  Liver Function Tests: Recent Labs  Lab 11/06/23 1537 11/07/23 0524 11/08/23 0454 11/09/23 0538 11/10/23 0427  AST 138* 127* 133* 121* 121*  ALT 96* 76* 72* 60* 58*  ALKPHOS 956* 1,070* 973* 1,156* 1,285*  BILITOT 0.8 0.7 0.5 0.5 0.8  PROT 8.1 7.4 7.0 6.9 7.3  ALBUMIN 2.6* 2.5* 2.2* 2.1* 2.1*    CBG: Recent Labs  Lab 11/07/23 1759  GLUCAP 283*     Recent Results (from the past 240 hours)  Resp panel by RT-PCR (RSV, Flu Kedric Bumgarner&B, Covid) Anterior Nasal Swab     Status: None   Collection Time: 11/06/23  3:23 PM   Specimen: Anterior Nasal Swab  Result Value Ref Range Status   SARS Coronavirus 2 by RT PCR NEGATIVE NEGATIVE Final    Comment: (NOTE) SARS-CoV-2 target nucleic acids are NOT DETECTED.  The SARS-CoV-2 RNA is generally detectable in upper respiratory specimens during the acute phase of infection. The lowest concentration of SARS-CoV-2 viral copies this assay can detect is 138 copies/mL. Graiden Henes negative result does not preclude SARS-Cov-2 infection and should not be used as the sole basis for treatment or other patient management decisions. Rasaan Brotherton negative result may occur with  improper specimen collection/handling, submission of specimen other than nasopharyngeal swab, presence of viral mutation(s) within the areas targeted by this assay, and inadequate number of viral copies(<138 copies/mL). Rashan Rounsaville negative result must be combined with clinical observations, patient history, and epidemiological information. The expected result is Negative.  Fact Sheet for Patients:  BloggerCourse.com  Fact Sheet for Healthcare Providers:  SeriousBroker.it  This test is no t yet approved or cleared by the [place] FDA and  has been authorized for detection and/or diagnosis of SARS-CoV-2 by FDA under an Emergency Use Authorization  (EUA). This EUA will remain  in effect (meaning this test can be used) for the duration of the COVID-19 declaration under Section 564(b)(1) of the Act, 21 U.S.C.section 360bbb-3(b)(1), unless the authorization is terminated  or revoked sooner.       Influenza Demaris Leavell by PCR NEGATIVE NEGATIVE Final   Influenza B by PCR NEGATIVE NEGATIVE Final    Comment: (NOTE) The Xpert Xpress SARS-CoV-2/FLU/RSV plus assay is intended as an aid in the diagnosis of influenza from Nasopharyngeal swab specimens and should not be used as Nashika Coker sole basis for treatment. Nasal washings and aspirates are unacceptable for Xpert Xpress SARS-CoV-2/FLU/RSV testing.  Fact Sheet for Patients: BloggerCourse.com  Fact Sheet for Healthcare Providers: SeriousBroker.it  This test is not yet approved or cleared by the [place] FDA and has been authorized for detection and/or diagnosis of SARS-CoV-2 by FDA under an Emergency Use Authorization (EUA). This EUA will remain in effect (meaning this test can be used) for the duration  of the COVID-19 declaration under Section 564(b)(1) of the Act, 21 U.S.C. section 360bbb-3(b)(1), unless the authorization is terminated or revoked.     Resp Syncytial Virus by PCR NEGATIVE NEGATIVE Final    Comment: (NOTE) Fact Sheet for Patients: BloggerCourse.com  Fact Sheet for Healthcare Providers: SeriousBroker.it  This test is not yet approved or cleared by the [place] FDA and has been authorized for detection and/or diagnosis of SARS-CoV-2 by FDA under an Emergency Use Authorization (EUA). This EUA will remain in effect (meaning this test can be used) for the duration of the COVID-19 declaration under Section 564(b)(1) of the Act, 21 U.S.C. section 360bbb-3(b)(1), unless the authorization is terminated or revoked.  Performed at [facility], [address], [place], [place] [address]          Radiology Studies: DG CHEST PORT 1 VIEW Result Date: 11/10/2023 CLINICAL DATA:  Metastatic colon cancer.  Chest pain. EXAM: PORTABLE CHEST 1 VIEW COMPARISON:  Chest radiograph dated 11/06/2023. FINDINGS: Left-sided [place] Elster-Cath in similar position. There is shallow inspiration with bibasilar linear and platelike atelectasis. No focal consolidation, pleural effusion, or pneumothorax. Stable cardiac silhouette. No acute osseous pathology. IMPRESSION: Shallow inspiration with bibasilar atelectasis. Electronically Signed   By: Elgie Collard M.D.   On: 11/10/2023 18:12         Scheduled Meds:  acetaminophen  1,000 mg Oral Q8H   carvedilol  25 mg Oral BID WC   Chlorhexidine Gluconate Cloth  6 each Topical Daily   dexamethasone  4 mg Intravenous Q24H   enoxaparin (LOVENOX) injection  40 mg Subcutaneous Q24H   feeding supplement  237 mL Oral BID BM   HYDROmorphone   Intravenous Q4H   morphine  30 mg Oral 2 times per day   And   morphine  60 mg Oral QHS   OLANZapine  2.5 mg Oral QHS   pantoprazole  40 mg Oral Daily   potassium chloride SA  20 mEq Oral Daily   Continuous Infusions:     LOS: 4 days    Time spent: over 30 min    Lacretia Nicks, MD [facility]   To contact the attending provider between 7A-7P or the covering provider during after hours 7P-7A, please log into the web site [email or web address] and access using universal [place] password for that web site. If you do not have the password, please call the hospital operator.  11/10/2023, 6:33 PM

## 2023-11-10 NOTE — Progress Notes (Signed)
 0.8 mLs Dilaudid from PCA wasted with April in steric.

## 2023-11-11 ENCOUNTER — Inpatient Hospital Stay (HOSPITAL_COMMUNITY)

## 2023-11-11 DIAGNOSIS — C182 Malignant neoplasm of ascending colon: Secondary | ICD-10-CM | POA: Diagnosis not present

## 2023-11-11 DIAGNOSIS — Z79899 Other long term (current) drug therapy: Secondary | ICD-10-CM | POA: Diagnosis not present

## 2023-11-11 DIAGNOSIS — Z515 Encounter for palliative care: Secondary | ICD-10-CM | POA: Diagnosis not present

## 2023-11-11 DIAGNOSIS — I10 Essential (primary) hypertension: Secondary | ICD-10-CM | POA: Insufficient documentation

## 2023-11-11 DIAGNOSIS — G893 Neoplasm related pain (acute) (chronic): Secondary | ICD-10-CM | POA: Diagnosis not present

## 2023-11-11 DIAGNOSIS — Z7189 Other specified counseling: Secondary | ICD-10-CM | POA: Diagnosis not present

## 2023-11-11 DIAGNOSIS — L8990 Pressure ulcer of unspecified site, unspecified stage: Secondary | ICD-10-CM | POA: Insufficient documentation

## 2023-11-11 DIAGNOSIS — R7989 Other specified abnormal findings of blood chemistry: Secondary | ICD-10-CM | POA: Insufficient documentation

## 2023-11-11 DIAGNOSIS — E43 Unspecified severe protein-calorie malnutrition: Secondary | ICD-10-CM | POA: Diagnosis not present

## 2023-11-11 DIAGNOSIS — R531 Weakness: Secondary | ICD-10-CM | POA: Diagnosis not present

## 2023-11-11 MED ORDER — GADOBUTROL 1 MMOL/ML IV SOLN
5.0000 mL | Freq: Once | INTRAVENOUS | Status: AC | PRN
  Administered 2023-11-11: 5 mL via INTRAVENOUS

## 2023-11-11 MED ORDER — MORPHINE SULFATE ER 30 MG PO TBCR
60.0000 mg | EXTENDED_RELEASE_TABLET | Freq: Three times a day (TID) | ORAL | Status: DC
  Administered 2023-11-11 – 2023-11-13 (×6): 60 mg via ORAL
  Filled 2023-11-11 (×6): qty 2

## 2023-11-11 MED ORDER — CELECOXIB 200 MG PO CAPS
200.0000 mg | ORAL_CAPSULE | Freq: Two times a day (BID) | ORAL | Status: AC
  Administered 2023-11-11 – 2023-11-15 (×10): 200 mg via ORAL
  Filled 2023-11-11 (×10): qty 1

## 2023-11-11 MED ORDER — HYDROMORPHONE HCL 1 MG/ML IJ SOLN
1.0000 mg | Freq: Once | INTRAMUSCULAR | Status: AC | PRN
  Administered 2023-11-11: 1 mg via INTRAVENOUS
  Filled 2023-11-11: qty 1

## 2023-11-11 MED ORDER — LORAZEPAM 2 MG/ML IJ SOLN
0.5000 mg | Freq: Once | INTRAMUSCULAR | Status: AC | PRN
  Administered 2023-11-11: 0.5 mg via INTRAVENOUS
  Filled 2023-11-11: qty 1

## 2023-11-11 MED ORDER — DEXAMETHASONE SODIUM PHOSPHATE 4 MG/ML IJ SOLN
8.0000 mg | INTRAMUSCULAR | Status: DC
  Administered 2023-11-11: 8 mg via INTRAVENOUS
  Filled 2023-11-11 (×2): qty 2

## 2023-11-11 NOTE — Consult Note (Signed)
 WOC Nurse Consult Note: Reason for Consult: new pressure injury Observed by therapy; bedside nurse has assessed  Wound type: Stage 2 Pressure Injury x 2 Pressure Injury POA: No Measurement: Left buttock per bedside nurse; 5cm x 2cm  Inner gluteal cleft per bedside nurse; 5cm x 1cm  Wound ZHY:QMVH, partial thickness Drainage (amount, consistency, odor) none Periwound: intact Dressing procedure/placement/frequency: Low air loss mattress ordered, patient is refusing to be turned at times due to pain, patient is hospice appropriate per notes Patient does not get up to chair Nursing skin care order set implemented with silicone foam, change every 3 days.    Discussed POC with patient and bedside nurse.  Re consult if needed, will not follow at this time. Thanks  Rhet Rorke [employer], RN,CWOCN, CNS, CWON-AP [phone])

## 2023-11-11 NOTE — Evaluation (Signed)
 Physical Therapy Evaluation Patient Details Name: Joseph Haney MRN: [id number] DOB: 08/12/1976 Today's Date: 11/11/2023  History of Present Illness  Joseph Haney is a 48 y.o. male admitted with cancer associated pain. PMH: colon cancer (s/p partial colectomy 2022), liver and pelvic bone metastasis  Clinical Impression  Pt admitted with above diagnosis. Pt from home with mom for ~2 months, reports spouse died in 09-05-24sister at bedside reports increased weakness for ~3 weeks with pt now completing sponge baths and no longer standing for showers, also limited amb in the home to restroom but mostly supine in regular bed at home. On eval, pt significantly limited by pain, PCA reached max limit. Pt needing max A+2 for rolling R/L, educated on log rolling for comfort due to back pain complaints. Sitting EOB not attempted due to high pain with rolling and PCA reached max limit. Pt's BLE noted to be profoundly weak, pt reports walking to ambulance to come to hospital, actively L ankle 1/5 strength, L knee 0/5, L hip 0/5 and entire R LE 0/5 with quad pain complaints with passive heel slide and hip abduction in supine. Pt reports good BLE sensation, though unsure if valid due to pt reports performing active glute set and not visualized or palpated by PT. Pt also noted to be soiled with bowel movement, though unaware. Pt's sister and multiple brothers present intermittently during evaluation, answering questions and offering encouragement as appropriate. D/c rec TBD and DME TBD pending hospital course; pt reports going for MRI today for further investigation of BLE weakness and back pain. Pt currently with functional limitations due to the deficits listed below (see PT Problem List). Pt will benefit from acute skilled PT to increase their independence and safety with mobility to allow discharge.           If plan is discharge home, recommend the following: Two people to help with walking and/or  transfers;A lot of help with bathing/dressing/bathroom;Assistance with cooking/housework;Assist for transportation   Can travel by private vehicle        Equipment Recommendations Other (comment) (TBD)  Recommendations for Other Services       Functional Status Assessment Patient has had a recent decline in their functional status and demonstrates the ability to make significant improvements in function in a reasonable and predictable amount of time.     Precautions / Restrictions        Mobility  Bed Mobility Overal bed mobility: Needs Assistance Bed Mobility: Rolling Rolling: Max assist, +2 for physical assistance, Used rails         General bed mobility comments: max A+2 to roll R/L, therapists managing LE into hooklying position, assist to rotate upper trunk and reach for bedrails, log rolling education and technique utilized for comfort    Transfers                   General transfer comment: not attempted, PCA max limit hit and pt reportingn 10/10 pain with rolling R/L    Ambulation/Gait                  Stairs            Wheelchair Mobility     Tilt Bed    Modified Rankin (Stroke Patients Only)       Balance  Pertinent Vitals/Pain Pain Assessment Pain Assessment: 0-10 Pain Score: 10-Worst pain ever Pain Location: back Pain Descriptors / Indicators: Guarding, Grimacing Pain Intervention(s): Limited activity within patient's tolerance, Monitored during session, Repositioned, PCA encouraged (PCA max limit reached during session)    Home Living Family/patient expects to be discharged to:: Private residence Living Arrangements: Alone;Parent Available Help at Discharge: Family;Available 24 hours/day Type of Home: House Home Access: Stairs to enter   [employer] of Steps: 1   Home Layout: One level   Additional Comments: deceased spouse's RW in the  closet. Pt's spouse died August 20224, living with mom for ~2 months with increased weakness and decline for past ~3 weeks but able to walk    Prior Function Prior Level of Function : Independent/Modified Independent             Mobility Comments: pt reports ind with in home ambulation without AD ADLs Comments: pt reports ind with self care, over last ~3 weeks switched to sponge baths, mom completing household chores     Extremity/Trunk Assessment   Upper Extremity Assessment Upper Extremity Assessment: Defer to OT evaluation    Lower Extremity Assessment Lower Extremity Assessment: RLE deficits/detail;LLE deficits/detail RLE Deficits / Details: PROM WFL, quad pain with passive heel slide and passive hip abduction; 0/5 ankle, knee and hip; pt reports good sensation but also reports performing isometric glute activation when not palpable RLE Coordination: decreased fine motor;decreased gross motor LLE Deficits / Details: PROM WFL, quad pain with passive heel slide and passive hip abduction; 1/5 ankle, 0/5 knee and hip; pt reports good sensation but also reports performing isometric glute activation when not palpable LLE Coordination: decreased fine motor;decreased gross motor       Communication   Communication Communication: No apparent difficulties    Cognition Arousal: Alert Behavior During Therapy: WFL for tasks assessed/performed   PT - Cognitive impairments: No apparent impairments                       PT - Cognition Comments: pt able to follow commands, asking multiple questions regarding prognosis, diagnosis and "will I ever be able to walk again?" Following commands: Intact       Cueing       General Comments      Exercises     Assessment/Plan    PT Assessment Patient needs continued PT services  PT Problem List Decreased strength;Decreased activity tolerance;Decreased balance;Decreased mobility       PT Treatment Interventions DME  instruction;Gait training;Functional mobility training;Therapeutic activities;Therapeutic exercise;Balance training;Neuromuscular re-education;Patient/family education;Wheelchair mobility training    PT Goals (Current goals can be found in the Care Plan section)  Acute Rehab PT Goals Patient Stated Goal: "walk again" PT Goal Formulation: With patient/family Time For Goal Achievement: 11/25/23 Potential to Achieve Goals: Fair    Frequency Min 2X/week     Co-evaluation               AM-PAC PT "6 Clicks" Mobility  Outcome Measure Help needed turning from your back to your side while in a flat bed without using bedrails?: Total Help needed moving from lying on your back to sitting on the side of a flat bed without using bedrails?: Total Help needed moving to and from a bed to a chair (including a wheelchair)?: Total Help needed standing up from a chair using your arms (e.g., wheelchair or bedside chair)?: Total Help needed to walk in hospital room?: Total Help needed climbing 3-5 steps with  a railing? : Total 6 Click Score: 6    End of Session Equipment Utilized During Treatment: Oxygen Activity Tolerance: Patient limited by pain Patient left: in bed;with call bell/phone within reach;with bed alarm set;with family/visitor present Nurse Communication: Mobility status      Time: 1003-1047 PT Time Calculation (min) (ACUTE ONLY): 44 min   Charges:   PT Evaluation $PT Eval Moderate Complexity: 1 Mod   PT General Charges $$ ACUTE PT VISIT: 1 Visit         Tori Danasia Baker PT, DPT 11/11/23, 11:13 AM

## 2023-11-11 NOTE — Assessment & Plan Note (Signed)
 replete as needed.

## 2023-11-11 NOTE — Assessment & Plan Note (Signed)
-   Suspect progressive functional decline from underlying cancer but also due to patient being mostly bedridden at home -Given lower extremity weakness with Stage IV colon cancer, MRI T and L-spine ordered.  Follow-up results

## 2023-11-11 NOTE — Evaluation (Signed)
 Occupational Therapy Evaluation Patient Details Name: Joseph Haney MRN: [id number] DOB: Oct 28, 1975 Today's Date: 11/11/2023   History of Present Illness   Joseph Haney is a 48 y.o. male admitted with cancer associated pain. PMH: colon cancer (s/p partial colectomy 2022), liver and pelvic bone metastasis     Clinical Impressions Pt admitted with the above diagnosis. Pt currently with functional limitations due to the deficits listed below (see OT Problem List). Prior to admit, pt reports that roughly 3 weeks ago he was independent with all ADL tasks and functional mobility. Currently, pt is requiring increased physical assist to complete bed level mobility. Unable to attempt OOB activity during evaluation d/t increased pain level. Discharge recommendation TBD as MRI is pending. Pt will benefit from acute skilled OT to increase their safety and independence with ADL and functional mobility for ADL to facilitate discharge.       If plan is discharge home, recommend the following:   Other (comment) (24/7 increased physical assist for all BADL tasks at bed level)     Functional Status Assessment   Patient has had a recent decline in their functional status and demonstrates the ability to make significant improvements in function in a reasonable and predictable amount of time.     Equipment Recommendations   BSC/3in1;Tub/shower bench;Hospital bed;Hoyer lift;Other (comment) (TBD)     \  Precautions/Restrictions   Precautions Precautions: Fall;Other (comment) (uncontrolled pain) Recall of Precautions/Restrictions: Intact Precaution/Restrictions Comments: Log rolled during bed mobility for comfort Restrictions Weight Bearing Restrictions Per Provider Order: No     Mobility Bed Mobility Overal bed mobility: Needs Assistance Bed Mobility: Rolling Rolling: Max assist, +2 for physical assistance, Used rails         General bed mobility comments: max A+2 to roll R/L,  therapists managing LE into hooklying position, assist to rotate upper trunk and reach for bedrails, log rolling education and technique utilized for comfort    Transfers    General transfer comment: not attempted, PCA max limit hit and pt reporting 10/10 pain with rolling R/L      Balance Overall balance assessment: Needs assistance (pt did not sit on EOB during evaluation although demonstrated poor core strength and stability requiring assist from pillow to provided lateral support while sitting up in bed.)      ADL either performed or assessed with clinical judgement   ADL Overall ADL's : Needs assistance/impaired Eating/Feeding: Set up;Bed level   Grooming: Set up;Bed level   Upper Body Bathing: Moderate assistance;Bed level   Lower Body Bathing: Total assistance;+2 for physical assistance;Bed level   Upper Body Dressing : Minimal assistance;Bed level   Lower Body Dressing: Total assistance;+2 for physical assistance;Bed level   Toilet Transfer:  (NT d/t safety concerns and pain)   Toileting- Clothing Manipulation and Hygiene: Total assistance;+2 for physical assistance;Bed level               Vision Baseline Vision/History: 0 No visual deficits Ability to See in Adequate Light: 0 Adequate Patient Visual Report: No change from baseline Vision Assessment?: No apparent visual deficits     Perception Perception: Not tested       Praxis Praxis: Not tested       Pertinent Vitals/Pain Pain Assessment Pain Assessment: 0-10 Pain Score: 10-Worst pain ever Pain Location: back Pain Descriptors / Indicators: Guarding, Grimacing Pain Intervention(s): Limited activity within patient's tolerance, Monitored during session, PCA encouraged, Repositioned     Extremity/Trunk Assessment Upper Extremity Assessment Upper Extremity Assessment: Right hand dominant;RUE  deficits/detail;LUE deficits/detail RUE Deficits / Details: A/ROM is [facility]. Strength 3/5 grossly RUE  Coordination: decreased gross motor LUE Deficits / Details: A/ROM is [facility]. Strength 3/5 grossly LUE Coordination: decreased gross motor   Lower Extremity Assessment Lower Extremity Assessment: Defer to PT evaluation RLE Deficits / Details: PROM WFL, quad pain with passive heel slide and passive hip abduction; 0/5 ankle, knee and hip; pt reports good sensation but also reports performing isometric glute activation when not palpable RLE Coordination: decreased fine motor;decreased gross motor LLE Deficits / Details: PROM WFL, quad pain with passive heel slide and passive hip abduction; 1/5 ankle, 0/5 knee and hip; pt reports good sensation but also reports performing isometric glute activation when not palpable LLE Coordination: decreased fine motor;decreased gross motor   Cervical / Trunk Assessment Cervical / Trunk Assessment: Kyphotic   Communication Communication Communication: No apparent difficulties   Cognition Arousal: Alert Behavior During Therapy: WFL for tasks assessed/performed Cognition: No apparent impairments    Following commands: Intact       Cueing  General Comments   Cueing Techniques: Verbal cues  VSS on RA           Home Living Family/patient expects to be discharged to:: Private residence Living Arrangements: Alone;Parent Available Help at Discharge: Family;Available 24 hours/day Type of Home: House Home Access: Stairs to enter [employer] of Steps: 1   Home Layout: One level     Bathroom Shower/Tub: Chief Strategy Officer: Standard     Home Equipment: Agricultural consultant (2 wheels)   Additional Comments: deceased spouse's RW in the closet. Pt's spouse died August 20224, living with mom for ~2 months with increased weakness and decline for past ~3 weeks but able to walk      Prior Functioning/Environment Prior Level of Function : Independent/Modified Independent      Mobility Comments: pt reports ind with in home  ambulation without AD ADLs Comments: pt reports ind with self care, over last ~3 weeks switched to sponge baths, mom completing household chores    OT Problem List: Decreased strength;Decreased activity tolerance;Impaired balance (sitting and/or standing);Decreased coordination;Decreased knowledge of use of DME or AE;Impaired UE functional use;Pain   OT Treatment/Interventions: Self-care/ADL training;Therapeutic exercise;Neuromuscular education;Energy conservation;DME and/or AE instruction;Manual therapy;Modalities;Splinting;Therapeutic activities;Patient/family education;Balance training      OT Goals(Current goals can be found in the care plan section)   Acute Rehab OT Goals Patient Stated Goal: to get better OT Goal Formulation: With patient Time For Goal Achievement: 11/25/23 Potential to Achieve Goals: Good   OT Frequency:  Min 2X/week    Co-evaluation PT/OT/SLP Co-Evaluation/Treatment: Yes Reason for Co-Treatment: Complexity of the patient's impairments (multi-system involvement);For patient/therapist safety;To address functional/ADL transfers   OT goals addressed during session: ADL's and self-care;Proper use of Adaptive equipment and DME;Strengthening/ROM      AM-PAC OT "6 Clicks" Daily Activity     Outcome Measure Help from another person eating meals?: None Help from another person taking care of personal grooming?: None Help from another person toileting, which includes using toliet, bedpan, or urinal?: Total Help from another person bathing (including washing, rinsing, drying)?: A Lot Help from another person to put on and taking off regular upper body clothing?: A Little Help from another person to put on and taking off regular lower body clothing?: Total 6 Click Score: 15   End of Session Nurse Communication: Mobility status;Other (comment) (new pressure area to sacral area)  Activity Tolerance: Patient limited by pain Patient left: in bed;with call bell/phone  within reach;with bed alarm set;with family/visitor present  OT Visit Diagnosis: Unsteadiness on feet (R26.81);Muscle weakness (generalized) (M62.81)                Time: 8295-6213 OT Time Calculation (min): 44 min Charges:  OT General Charges $OT Visit: 1 Visit OT Evaluation $OT Eval Moderate Complexity: 1 Mod  [employer], OTR/L,CBIS  Supplemental OT - [facility] and [facility] Secure Chat Preferred    Raechelle Sarti, Charisse March 11/11/2023, 11:55 AM

## 2023-11-11 NOTE — Hospital Course (Signed)
 Joseph Haney is a 48 yo male with PMH metastatic colon cancer (lung, liver, bones), worsening debility/weakness, GERD who initially presented with shortness of breath, weakness, and uncontrolled pain. He has had gradual decline since December 2024 spending majority of time in bed and having ongoing anorexia with weight loss. He was admitted for pain control.  Palliative care and oncology were consulted on admission as well.

## 2023-11-11 NOTE — Assessment & Plan Note (Signed)
-   Palliative care consulted for assistance with pain management and GOC discussions - Currently on Dilaudid PCA with MS Contin being continuously adjusted

## 2023-11-11 NOTE — Assessment & Plan Note (Signed)
-   Body mass index is 21.28 kg/m. - continued poor intake and weight loss

## 2023-11-11 NOTE — Assessment & Plan Note (Signed)
continue coreg 

## 2023-11-11 NOTE — Progress Notes (Signed)
   11/11/23 1525  Spiritual Encounters  Type of Visit Initial  Care provided to: Pt and family  Reason for visit Routine spiritual support  OnCall Visit No   Visited with patient and family. Patient had two guest at his bedside. Chaplain introduced self and advised of spiritual care and support availability. Patient declined at this time, however showed interest in a later date.

## 2023-11-11 NOTE — Progress Notes (Signed)
 Daily Progress Note   Patient Name: Joseph Haney       Date: 11/11/2023 DOB: 1976/02/08  Age: 48 y.o. MRN#: [id number] Attending Physician: Lewie Chamber, MD Primary Care Physician: Frederic Jericho, [name] Admit Date: 11/06/2023 Length of Stay: 5 days  Reason for Consultation/Follow-up: Non pain symptom management and Pain control  Subjective:   CC: Patient continuing to have diffuse pain in setting of metastatic cancer to bone.  Palliative medicine team following up regarding symptom management.  Subjective:  Reviewed EMR prior to presenting to bedside.  At time of EMR review in past 24 hours patient has received IV Dilaudid via PCA bolus dose of 0.75 mg x 41 boluses.  Patient also receiving MS Contin 30 mg at 8 AM, 30 mg at 2 PM, and 60 mg at 10 PM.  Patient also receiving IV dexamethasone 4 mg daily and olanzapine 2.5 mg nightly (started 11/10/2023). Discussed care with bedside RN for medical updates.  Planning to obtain MRI today.  Presented to bedside to see patient.  OT/PT initially at bedside though noted they would return to work with patient.  Patient's sister present at bedside.  Patient gave permission to engage in conversation.  Able to follow-up regarding patient's pain at this time.  Patient reports pain primarily at this time in right upper quadrant mainly in the back and upper ribs in the back.  Patient feels IV Dilaudid from PCA does assist with pain management though quickly wears off.  Again spent time educating about IV Dilaudid being a short acting medication.  Discussed ideas to get patient on longer acting medication that worked better moving forward.  Discussed based on patient's OME needs, can increase patient's MS Contin today.  Patient denies any adverse effects from this medication.  Noted would increase MS Contin to 60 mg every 8 hours. Patient did state that he was able to eat more this morning.  Patient notes that his pain must be better controlled as that has  been limiting him with movement of his right arm the past few days.  Acknowledged this. Patient also primarily describing bone associated pain.  Discussed in the setting of cancer metastatic to bone, would make commend that we will increase his IV steroids to assist with inflammatory pain as well as had NSAIDs for management.  Reviewed patient's BMP which showed creatinine of 0.79 and CBC showing platelets at 238.  Patient agreeing with adjustments to steroids and adding NSAIDs to hopefully assist with inflammatory pain management.  Patient concerned about pain associated with MRI.  Noted would provide with one-time dose of IV Dilaudid 1 mg before MRI and IV Ativan 0.5 mg before MRI to assist with management.  Patient agreement with this plan.  Spent time answering questions as able.  Noted palliative medicine team to continue to follow with patient's medical journey.  Discussed care with IDT including nurse and hospitalist after visit.  Objective:   Vital Signs:  BP 128/70 (BP Location: Right Arm)   Pulse 94   Temp 98.6 F (37 C) (Oral)   Resp 18   Ht 5\' 4"  (1.626 m)   Wt 56.2 kg   SpO2 99%   BMI 21.28 kg/m   Physical Exam: General: NAD, alert, cachectic, chronically ill-appearing Cardiovascular: RRR Respiratory: no increased work of breathing noted, not in respiratory distress Neuro: A&Ox4, following commands easily Psych: appropriately answers all questions  Imaging:  I personally reviewed recent imaging.   Assessment & Plan:   Assessment: Patient is a  48 year old male with a past medical history of metastatic colon cancer with metastatic disease to lungs, bone, and liver who was admitted on 11/06/2023 for uncontrolled cancer associated pain.  Recent imaging has shown progression of the disease and patient's bone.  Oncology consulted for recommendations.  Palliative medicine team consulted to assist with symptom management.  Recommendations/Plan: # Complex medical decision  making/goals of care:  - Discussed care with patient as detailed above in HPI.  Has been recommended by oncology that patient consider hospice support at home; agree with this recommendation due to patient's poor nutritional and functional status..  Patient having difficulty processing his medical illness and has stated that he does not currently want to pursue getting home with hospice support.  Have explained how hospice does not mean there is a "timestamp" and if patient improved with hospice support, could follow-up with Dr. Mosetta Putt in the outpatient setting.  Patient continues to state he wants to "get better" in terms of regaining strength and improving nutrition to pursue further cancer directed therapies.  Palliative medicine team to continue engaging as able.  -  Code Status: Limited: Do not attempt resuscitation (DNR) -DNR-LIMITED -Do Not Intubate/DNI   # Symptom management: Patient is receiving these palliative interventions for symptom management with an intent to improve quality of life.   -Pain, acutely severe in setting of metastatic colon cancer with mets to bone, liver, and lung Within the past 24 hours hours, patient has required 30.75 mg of IV Dilaudid for opioid management. Based on OMEs calculated for this dose, which would be approximately 192 OME when reducing by 50% for incomplete cross tolerance, will appropriately start patient on the listed regimen below.    -Change MS Contin to 60 mg every 8 hours.  Realize this will likely need to be further increased, trying to titrate up slowly.   -Continue PCA with IV Dilaudid 0.75 mg every 10 minutes bolus.  No continuous infusion as on long-acting oral opioids.   -Increase IV dexamethasone 8 mg daily   -Start Celebrex 200 mg twice daily   -Consider use of bisphosphonate    -Will provide one-time as needed dose of Ativan 0.5 mg IV and 1 mg Dilaudid prior to MRI today.   -Mood/appetite   -Personally reviewed EKG which noted QTc on  11/09/2023 to be 432   -Continue olanzapine 2.5 mg nightly  # Psychosocial Support:  -Brother, mother  # Discharge Planning: To Be Determined  Discussed with: Hospitalist, RN, patient, patient's sister present at bedside  Thank you for allowing the palliative care team to participate in the care [name].  Alvester Morin, DO Palliative Care Provider PMT # [phone]  If patient remains symptomatic despite maximum doses, please call PMT at [phone] between 0700 and 1900. Outside of these hours, please call attending, as PMT does not have night coverage.

## 2023-11-11 NOTE — Assessment & Plan Note (Signed)
-   follows with Dr. Mosetta Putt; was noted to have been too weak to follow up with oncology in the office as well recently  - with poor performance status, malnutrition, and tolerance, he has been recommended for transition to hospice but he is not yet ready - status has been updated to DNR at this time but main focus still remains pain control, but he's hopeful to walk more again; and still hopeful for "treatment again one day" - MRI T/L spine obtained 3/12 showing further metastatic lesions ("diffuse osseous disease in lumbar, lower thoracic, and posterior pelvis"; also noted with tumor extending into anterior epidural spaces along T12-L3 - evaluated by rad-onc on 3/13; suspect some of the lesion at T2-T4 may be amenable to radiation; patient planning to discuss further with his mom tonight, but tentative plan for radiation beginning 3/14 - decadron started 3/13 per palliative care as well

## 2023-11-11 NOTE — Assessment & Plan Note (Signed)
-   related to metastasis

## 2023-11-11 NOTE — Assessment & Plan Note (Addendum)
-   palliative care following and continuing discussions as needed - had long discussion 3/17 also; FYI, patient also benefits from conversations with family present (today, son and 2 brothers present) and I do not feel he is truly following the full conversation at times and his understanding in general is very poor, especially with the complexity of his diagnoses; again, reviewed terminal and uncurable diagnosis and the progression notably as evidenced from last PET to current MRIs. Empathetic listening and validation given to all in the room; despite ~45 min conversation, their biggest "ask" continues to be hoping he can continue chemo (to the point of wanting to seek 2nd opinion at [facility], etc). Explained he'd have to substantially improve physically (both mobility and nutritionally) but probably other factors too needed by oncology. They do seem to be in the "bargaining" phase but also are just fixated on prolonging life even if not much quality (seems like since patient is talking and capable of engaging in conversations etc that they deem this as enough quality of life), however they will also say he was "getting out of bed" at least a few times just prior to admission so they did not agree that he was "bedbound" despite also having a pressure ulcer on admission (although it did worsen during hospitalization due to patient declining turns in bed etc and not getting up with PT hardly any). End point of the conversation was that he plans to "try" and see what he's physically capable of in the coming days and going to "eat better" and they plan to ask Dr. Mosetta Putt for chemo and if not, then it sounds like if/when they are discharged home that they might pursue 2nd opinion elsewhere, however I also explained that unless he could essentially ambulate to a car effectively and walk into an appointment (amongst other things), that there was also very low chance of being offered any treatment elsewhere (although they also  mentioned [facility] as well)

## 2023-11-11 NOTE — Progress Notes (Signed)
 Progress Note    Joseph Haney   ZOX:[id number]  DOB: Jun 08, 1976  DOA: 11/06/2023     5 PCP: Frederic Jericho, [name]  Initial CC: pain, SOB  Hospital Course: [name] is a 48 yo male with PMH metastatic colon cancer (lung, liver, bones), worsening debility/weakness, GERD who initially presented with shortness of breath, weakness, and uncontrolled pain. He has had gradual decline since December 2024 spending majority of time in bed and having ongoing anorexia with weight loss. He was admitted for pain control.  Palliative care and oncology were consulted on admission as well.  Interval History:  Noted overnight.  Family present bedside this morning.  We reviewed pain control plans.  He is hopeful for trying to walk again and be more mobile.  MRI being performed today.  Assessment and Plan: * Cancer associated pain - Palliative care consulted for assistance with pain management and GOC discussions - Currently on Dilaudid PCA with MS Contin being continuously adjusted  Generalized weakness - Suspect progressive functional decline from underlying cancer but also due to patient being mostly bedridden at home -Given lower extremity weakness with Stage IV colon cancer, MRI T and L-spine ordered.  Follow-up results  Goals of care, counseling/discussion - palliative care following and continuing discussions as needed  Cancer of right colon (HCC) - follows with Dr. Mosetta Putt; was noted to have been too weak to follow up with oncology in the office as well recently  - with poor performance status, malnutrition, and tolerance, he has been recommended for transition to hospice but he is not yet ready - status has been updated to DNR at this time but main focus still remains pain control, but he's hopeful to walk more again; and still hopeful for "treatment again one day"  Hypokalemia - replete as needed  Elevated LFTs - related to metastasis   HTN (hypertension) - continue  coreg  Protein-calorie malnutrition, severe (HCC) - Body mass index is 21.28 kg/m. - continued poor intake and weight loss   Old records reviewed in assessment of this patient  Antimicrobials:   DVT prophylaxis:  enoxaparin (LOVENOX) injection 40 mg Start: 11/07/23 1000   Code Status:   Code Status: Limited: Do not attempt resuscitation (DNR) -DNR-LIMITED -Do Not Intubate/DNI   Mobility Assessment (Last 72 Hours)     Mobility Assessment     Row Name 11/11/23 1143 11/11/23 1057 11/11/23 0740 11/10/23 2034 11/10/23 0900   Does patient have an order for bedrest or is patient medically unstable -- -- No - Continue assessment No - Continue assessment No - Continue assessment   What is the highest level of mobility based on the progressive mobility assessment? Level 1 (Bedfast) - Unable to balance while sitting on edge of bed Level 1 (Bedfast) - Unable to balance while sitting on edge of bed Level 3 (Stands with assist) - Balance while standing  and cannot march in place Level 3 (Stands with assist) - Balance while standing  and cannot march in place Level 3 (Stands with assist) - Balance while standing  and cannot march in place   Is the above level different from baseline mobility prior to current illness? -- -- No - Consider discontinuing PT/OT -- --    Row Name 11/09/23 2230 11/09/23 0734 11/08/23 2000       Does patient have an order for bedrest or is patient medically unstable No - Continue assessment No - Continue assessment No - Continue assessment     What is  the highest level of mobility based on the progressive mobility assessment? Level 3 (Stands with assist) - Balance while standing  and cannot march in place Level 3 (Stands with assist) - Balance while standing  and cannot march in place Level 3 (Stands with assist) - Balance while standing  and cannot march in place     Is the above level different from baseline mobility prior to current illness? -- No - Consider  discontinuing PT/OT Yes - Recommend PT order              Barriers to discharge: none Disposition Plan:  Home  HH orders placed:  Status is: Inpt  Objective: Blood pressure 128/70, pulse 94, temperature 98.6 F (37 C), temperature source Oral, resp. rate 16, height 5\' 4"  (1.626 m), weight 56.2 kg, SpO2 99%.  Examination:  Physical Exam Constitutional:      General: He is not in acute distress.    Comments: Thin and in no distress  HENT:     Head: Normocephalic and atraumatic.     Mouth/Throat:     Mouth: Mucous membranes are moist.  Eyes:     Extraocular Movements: Extraocular movements intact.  Cardiovascular:     Rate and Rhythm: Normal rate and regular rhythm.  Pulmonary:     Effort: Pulmonary effort is normal. No respiratory distress.     Breath sounds: Normal breath sounds. No wheezing.  Abdominal:     General: Bowel sounds are normal. There is no distension.     Palpations: Abdomen is soft.     Tenderness: There is no abdominal tenderness.  Musculoskeletal:        General: Normal range of motion.     Cervical back: Normal range of motion and neck supple.  Skin:    General: Skin is warm and dry.  Neurological:     Mental Status: He is alert.     Motor: Weakness (1-2/5 LE strength) present.  Psychiatric:        Attention and Perception: Attention normal.        Mood and Affect: Mood is depressed.        Speech: Speech normal.        Behavior: Behavior normal.        Thought Content: Thought content normal.      Consultants:  Oncology Palliative care   Procedures:    Data Reviewed: No results found for this or any previous visit (from the past 24 hours).  I have reviewed pertinent nursing notes, vitals, labs, and images as necessary. I have ordered labwork to follow up on as indicated.  I have reviewed the last notes from staff over past 24 hours. I have discussed patient's care plan and test results with nursing staff, CM/SW, and other staff as  appropriate.  Time spent: Greater than 50% of the 55 minute visit was spent in counseling/coordination of care for the patient as laid out in the A&P.   LOS: 5 days   Lewie Chamber, MD [facility] 11/11/2023, 5:15 PM

## 2023-11-11 NOTE — Progress Notes (Signed)
 Joseph Haney   DOB:05-04-1976   NG#:[id number]   GMW#:[id number]  Medical oncology follow-up note  Subjective: Patient states his pain has improved, but not adequately controlled.  His appetite has improved, he is eating well.  He has not been out of bed much.    Objective:  Vitals:   11/11/23 2007 11/11/23 2116  BP:  103/62  Pulse:  94  Resp: 12 16  Temp:  98.5 F (36.9 C)  SpO2: 98% 100%    Body mass index is 21.28 kg/m.  Intake/Output Summary (Last 24 hours) at 11/11/2023 2231 Last data filed at 11/11/2023 1900 Gross per 24 hour  Intake 360 ml  Output 1175 ml  Net -815 ml     Sclerae unicteric  Oropharynx clear  MSK no focal spinal tenderness, no peripheral edema  Neuro nonfocal   CBG (last 3)  No results for input(s): "GLUCAP" in the last 72 hours.    Labs:  Lab Results  Component Value Date   WBC 14.4 (H) 11/10/2023   HGB 8.0 (L) 11/10/2023   HCT 27.0 (L) 11/10/2023   MCV 88.5 11/10/2023   PLT 238 11/10/2023   NEUTROABS 9.7 (H) 11/10/2023      Urine Studies No results for input(s): "UHGB", "CRYS" in the last 72 hours.  Invalid input(s): "UACOL", "UAPR", "USPG", "UPH", "UTP", "UGL", "UKET", "UBIL", "UNIT", "UROB", "ULEU", "UEPI", "UWBC", "URBC", "UBAC", "CAST", "UCOM", "BILUA"  Basic Metabolic Panel: Recent Labs  Lab 11/06/23 1537 11/07/23 0524 11/08/23 0454 11/09/23 0538 11/10/23 0427  NA 135 136 135 133* 133*  K 3.4* 3.9 4.5 4.4 4.6  CL 104 103 103 100 99  CO2 20* 21* 22 22 22   GLUCOSE 91 117* 111* 104* 114*  BUN 14 11 18 14 20   CREATININE 0.53* 0.58* 0.83 0.63 0.79  CALCIUM 8.8* 8.6* 8.4* 8.3* 8.4*  MG  --   --  1.8 1.8 2.0  PHOS  --   --  3.5 3.2  --    GFR Estimated Creatinine Clearance: 88.8 mL/min (by C-G formula based on SCr of 0.79 mg/dL). Liver Function Tests: Recent Labs  Lab 11/06/23 1537 11/07/23 0524 11/08/23 0454 11/09/23 0538 11/10/23 0427  AST 138* 127* 133* 121* 121*  ALT 96* 76* 72* 60* 58*  ALKPHOS 956*  1,070* 973* 1,156* 1,285*  BILITOT 0.8 0.7 0.5 0.5 0.8  PROT 8.1 7.4 7.0 6.9 7.3  ALBUMIN 2.6* 2.5* 2.2* 2.1* 2.1*   No results for input(s): "LIPASE", "AMYLASE" in the last 168 hours. No results for input(s): "AMMONIA" in the last 168 hours. Coagulation profile No results for input(s): "INR", "PROTIME" in the last 168 hours.  CBC: Recent Labs  Lab 11/06/23 1537 11/07/23 0524 11/08/23 0454 11/09/23 0538 11/10/23 0427  WBC 16.2* 14.4* 13.3* 13.9* 14.4*  NEUTROABS 11.6*  --  11.0* 9.0* 9.7*  HGB 8.9* 8.2* 8.0* 8.2* 8.0*  HCT 30.0* 27.0* 26.2* 26.7* 27.0*  MCV 87.0 87.4 85.9 86.1 88.5  PLT 231 209 226 244 238   Cardiac Enzymes: No results for input(s): "CKTOTAL", "CKMB", "CKMBINDEX", "TROPONINI" in the last 168 hours. BNP: Invalid input(s): "POCBNP" CBG: Recent Labs  Lab 11/07/23 1759  GLUCAP 283*   D-Dimer No results for input(s): "DDIMER" in the last 72 hours. Hgb A1c No results for input(s): "HGBA1C" in the last 72 hours. Lipid Profile No results for input(s): "CHOL", "HDL", "LDLCALC", "TRIG", "CHOLHDL", "LDLDIRECT" in the last 72 hours. Thyroid function studies No results for input(s): "TSH", "T4TOTAL", "T3FREE", "THYROIDAB" in the last  72 hours.  Invalid input(s): "FREET3" Anemia work up No results for input(s): "VITAMINB12", "FOLATE", "FERRITIN", "TIBC", "IRON", "RETICCTPCT" in the last 72 hours. Microbiology Recent Results (from the past 240 hours)  Resp panel by RT-PCR (RSV, Flu A&B, Covid) Anterior Nasal Swab     Status: None   Collection Time: 11/06/23  3:23 PM   Specimen: Anterior Nasal Swab  Result Value Ref Range Status   SARS Coronavirus 2 by RT PCR NEGATIVE NEGATIVE Final    Comment: (NOTE) SARS-CoV-2 target nucleic acids are NOT DETECTED.  The SARS-CoV-2 RNA is generally detectable in upper respiratory specimens during the acute phase of infection. The lowest concentration of SARS-CoV-2 viral copies this assay can detect is 138 copies/mL. A  negative result does not preclude SARS-Cov-2 infection and should not be used as the sole basis for treatment or other patient management decisions. A negative result may occur with  improper specimen collection/handling, submission of specimen other than nasopharyngeal swab, presence of viral mutation(s) within the areas targeted by this assay, and inadequate number of viral copies(<138 copies/mL). A negative result must be combined with clinical observations, patient history, and epidemiological information. The expected result is Negative.  Fact Sheet for Patients:  BloggerCourse.com  Fact Sheet for Healthcare Providers:  SeriousBroker.it  This test is no t yet approved or cleared by the [place] FDA and  has been authorized for detection and/or diagnosis of SARS-CoV-2 by FDA under an Emergency Use Authorization (EUA). This EUA will remain  in effect (meaning this test can be used) for the duration of the COVID-19 declaration under Section 564(b)(1) of the Act, 21 U.S.C.section 360bbb-3(b)(1), unless the authorization is terminated  or revoked sooner.       Influenza A by PCR NEGATIVE NEGATIVE Final   Influenza B by PCR NEGATIVE NEGATIVE Final    Comment: (NOTE) The Xpert Xpress SARS-CoV-2/FLU/RSV plus assay is intended as an aid in the diagnosis of influenza from Nasopharyngeal swab specimens and should not be used as a sole basis for treatment. Nasal washings and aspirates are unacceptable for Xpert Xpress SARS-CoV-2/FLU/RSV testing.  Fact Sheet for Patients: BloggerCourse.com  Fact Sheet for Healthcare Providers: SeriousBroker.it  This test is not yet approved or cleared by the [place] FDA and has been authorized for detection and/or diagnosis of SARS-CoV-2 by FDA under an Emergency Use Authorization (EUA). This EUA will remain in effect (meaning this test can  be used) for the duration of the COVID-19 declaration under Section 564(b)(1) of the Act, 21 U.S.C. section 360bbb-3(b)(1), unless the authorization is terminated or revoked.     Resp Syncytial Virus by PCR NEGATIVE NEGATIVE Final    Comment: (NOTE) Fact Sheet for Patients: BloggerCourse.com  Fact Sheet for Healthcare Providers: SeriousBroker.it  This test is not yet approved or cleared by the [place] FDA and has been authorized for detection and/or diagnosis of SARS-CoV-2 by FDA under an Emergency Use Authorization (EUA). This EUA will remain in effect (meaning this test can be used) for the duration of the COVID-19 declaration under Section 564(b)(1) of the Act, 21 U.S.C. section 360bbb-3(b)(1), unless the authorization is terminated or revoked.  Performed at [facility], [address] [place], [place] [address]       Studies:  MR THORACIC SPINE W WO CONTRAST Result Date: 11/11/2023 CLINICAL DATA:  Back pain.  Metastatic colon cancer EXAM: MRI THORACIC WITHOUT AND WITH CONTRAST TECHNIQUE: Multiplanar and multiecho pulse sequences of the thoracic spine were obtained without and with  intravenous contrast. CONTRAST:  5mL GADAVIST GADOBUTROL 1 MMOL/ML IV SOLN COMPARISON:  CT 11/06/2023 FINDINGS: Alignment:  Physiologic. Vertebrae: Diffuse marrow replacement throughout the thoracic spine with patchy enhancement on postcontrast sequences. Bulging of the posterior walls of the majority of the thoracic vertebral bodies with a small amount of enhancing tumor extending into the anterior epidural spaces. This is most pronounced at the T4 level on the right where a expansile lesion centered in the right aspect of the vertebral body and pedicle abuts the thoracic cord (series 20, image 12). The right foramina at T3-4 and T4-5 are narrowed by this process. Additional expansile bony lesions with extraosseous soft tissue  component include the posterior left T5 rib and the posterior right T7 and T11 ribs. Pathologic fracture of the T4 vertebral body appears acute with approximately 40% vertebral body height loss. Subtle superior endplate compression deformities at T5, T6, T7, and T9. Cord: Increased T2 signal within the thoracic cord at the T3-4 level (series 19, image 14). Paraspinal and other soft tissues: Right upper lobe pulmonary nodule (series 20, image 10). Disc levels: Canal stenosis at the level of the T4 vertebral body primarily secondary to mass effect from expansile tumor. No additional levels of significant canal stenosis. IMPRESSION: 1. 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 Bulging of the posterior walls of the majority of the thoracic vertebral bodies with a small amount of enhancing tumor extending into the anterior epidural spaces. This is most pronounced at the T4 level on the right where an expansile lesion centered in the right aspect of the vertebral body and pedicle abuts the thoracic cord. Increased T2 signal within the thoracic cord at the T3-4 level, likely reflecting cord edema. 2. Pathologic fracture of the T4 vertebral body appears acute. Subtle superior endplate compression deformities at T5, T6, T7, and T9. 3. Right upper lobe pulmonary nodule. Electronically Signed   By: Duanne Guess D.O.   On: 11/11/2023 18:59   MR Lumbar Spine W Wo Contrast Result Date: 11/11/2023 CLINICAL DATA:  Severe back pain and bilateral leg weakness. History of metastatic colon cancer EXAM: MRI LUMBAR SPINE WITHOUT AND WITH CONTRAST TECHNIQUE: Multiplanar and multiecho pulse sequences of the lumbar spine were obtained without and with intravenous contrast. CONTRAST:  5mL GADAVIST GADOBUTROL 1 MMOL/ML IV SOLN COMPARISON:  CT 11/06/2023, 02/19/2023 FINDINGS: Segmentation:  Standard. Alignment:  Physiologic. Vertebrae: Diffuse marrow replacement throughout the lumbar spine, lower thoracic spine,  and included posterior pelvis with patchy postcontrast enhancement. Findings are compatible with diffuse osseous metastatic disease. There is slight bulging of the posterior walls of the T12 through L3 vertebral bodies with a small amount of tumor extending into the anterior epidural spaces at these levels (see series 26, images 1-20). Subtle superior endplate compression fracture of the L2 vertebral body, new since 02/19/2023. Assessment for associated bone marrow edema at this location is limited in the presence of diffuse marrow replacement. There is also a very subtle endplate depression at L1. Conus medullaris and cauda equina: Conus extends to the L1-L2 level. Conus and cauda equina appear normal. Paraspinal and other soft tissues: No acute abnormality. Disc levels: Mild degenerative disc disease at L5-S1 with annular disc bulge. No focal disc protrusion at any level. No canal stenosis within the lumbar spine. Mild bilateral foraminal stenosis at L5-S1, left greater than right. No additional level of foraminal narrowing. IMPRESSION: 1. 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 2. Slight bulging of the  posterior walls of the T12 through L3 vertebral bodies with a small amount of tumor extending into the anterior epidural spaces at these levels. 3. Subtle superior endplate compression fracture of the L1 and L2 vertebral bodies. Assessment for associated bone marrow edema at this location is limited in the presence of diffuse marrow replacement. 4. Mild degenerative disc disease at L5-S1 with mild bilateral foraminal stenosis, left greater than right. 5. No canal stenosis within the lumbar spine. Electronically Signed   By: Duanne Guess D.O.   On: 11/11/2023 18:45   DG CHEST PORT 1 VIEW Result Date: 11/10/2023 CLINICAL DATA:  Metastatic colon cancer.  Chest pain. EXAM: PORTABLE CHEST 1 VIEW COMPARISON:  Chest radiograph dated  11/06/2023. FINDINGS: Left-sided Port-A-Cath in similar position. There is shallow inspiration with bibasilar linear and platelike atelectasis. No focal consolidation, pleural effusion, or pneumothorax. Stable cardiac silhouette. No acute osseous pathology. IMPRESSION: Shallow inspiration with bibasilar atelectasis. Electronically Signed   By: Elgie Collard M.D.   On: 11/10/2023 18:12    Assessment: 48 y.o. male   Metastatic colon cancer Cancer related pain, anorexia, and weakness Anemia and leukocytosis Hypertension Transaminitis Severe protein and calorie malnutrition Depression    Plan:  -Patient is a poor candidate for chemotherapy due to poor performance status, malnutrition and noncompliant with appointments.  I have recommended home hospice on discharge.  Patient has not agreed with hospice care, but agreed with DNR and palliative goal of care. -I appreciated palliative care team's input and help.  -Patient's appetite has improved, but has not been out of bed much.  I encouraged him to participate physical therapy if his pain is controlled. -Due to his diffuse bone metastasis related pain, I do not feel palliative radiation is needed. -I will f/u as needed    Malachy Mood, MD 11/11/2023

## 2023-11-12 ENCOUNTER — Ambulatory Visit
Admit: 2023-11-12 | Discharge: 2023-11-12 | Disposition: A | Discharge status: Home / Self Care | Attending: Radiation Oncology | Admitting: Radiation Oncology

## 2023-11-12 DIAGNOSIS — C189 Malignant neoplasm of colon, unspecified: Secondary | ICD-10-CM | POA: Diagnosis not present

## 2023-11-12 DIAGNOSIS — Z7189 Other specified counseling: Secondary | ICD-10-CM | POA: Diagnosis not present

## 2023-11-12 DIAGNOSIS — Z515 Encounter for palliative care: Secondary | ICD-10-CM | POA: Diagnosis not present

## 2023-11-12 DIAGNOSIS — R630 Anorexia: Secondary | ICD-10-CM | POA: Diagnosis not present

## 2023-11-12 DIAGNOSIS — G893 Neoplasm related pain (acute) (chronic): Secondary | ICD-10-CM | POA: Diagnosis not present

## 2023-11-12 DIAGNOSIS — C182 Malignant neoplasm of ascending colon: Secondary | ICD-10-CM | POA: Diagnosis not present

## 2023-11-12 DIAGNOSIS — R531 Weakness: Secondary | ICD-10-CM | POA: Diagnosis not present

## 2023-11-12 MED ORDER — DEXAMETHASONE SODIUM PHOSPHATE 4 MG/ML IJ SOLN
4.0000 mg | Freq: Four times a day (QID) | INTRAMUSCULAR | Status: DC
  Administered 2023-11-12 – 2023-11-16 (×16): 4 mg via INTRAVENOUS
  Filled 2023-11-12 (×17): qty 1

## 2023-11-12 MED ORDER — OLANZAPINE 5 MG PO TABS
5.0000 mg | ORAL_TABLET | Freq: Every day | ORAL | Status: DC
  Administered 2023-11-12 – 2023-12-02 (×21): 5 mg via ORAL
  Filled 2023-11-12 (×21): qty 1

## 2023-11-12 NOTE — Progress Notes (Signed)
 Daily Progress Note   Patient Name: Joseph Haney       Date: 11/12/2023 DOB: 1976/02/27  Age: 48 y.o. MRN#: [id number] Attending Physician: Lewie Chamber, MD Primary Care Physician: Frederic Jericho, [name] Admit Date: 11/06/2023 Length of Stay: 6 days  Reason for Consultation/Follow-up: Non pain symptom management and Pain control  Subjective:   CC: Patient continuing to have diffuse pain in setting of metastatic cancer to bone.  Palliative medicine team following up regarding symptom management.  Subjective:  Reviewed EMR prior to presenting to bedside.  Personally reviewed MRI imaging.  Diffuse osseous disease noted, noted signs of tumor at extending into epidural space with signs of cord edema and acute pathological fracture. Upon review of EMR, in past 24 hours patient has received IV Dilaudid via PCA 0.75 mg bolus x 54 doses.  Patient continues to receive MS Contin 60 mg every 8. Discussed care with oncology, hospitalist, and radiation oncologist prior to seeing patient.  Expressed concern that with patient's extensive bony disease, unfortunately opioids would not be able to completely control this type of pain without getting levels to where adverse effects such as lethargy would be present.  Patient wanting to be as awake as able.  Have already increased steroids to every 6 hours.  Radiation oncologist evaluated imaging today and noted that after speaking with patient, short course of radiation therapy to patient's primary location of pain around T4 (where patient noted to have tumor extending into epidural space) would be appropriate.  Presented to bedside to see patient.  Patient noted he had just spoken with the radiation oncologist.  Patient considering pursuing radiation starting tomorrow.  Spent time discussing different types of pain and am concerned that opioids are not going to target his particular source of pain, though radiation may be able to assist with management of  this.  Patient awaiting to talk further to his mother about it.  Spent time answering questions as able.  Discussed with patient plan for today to increase IV steroids to every 6 hours to help with inflammatory aspects of pain.  Patient's appetite is slowly improving so we will increase olanzapine as well since denies adverse effects.  All questions answered at that time.  Noted palliative medicine team continue to follow with patient's medical journey.  Discussed care with bedside RN to update regarding plan.  Discussed care with IDT during the day to coordinate care. Objective:   Vital Signs:  BP 118/63 (BP Location: Right Arm)   Pulse 89   Temp 97.8 F (36.6 C) (Oral)   Resp 20   Ht 5\' 4"  (1.626 m)   Wt 56.2 kg   SpO2 100%   BMI 21.28 kg/m   Physical Exam: General: NAD, alert, cachectic, chronically ill-appearing Cardiovascular: RRR Respiratory: no increased work of breathing noted, not in respiratory distress Neuro: A&Ox4, following commands easily Psych: appropriately answers all questions  Imaging:  I personally reviewed recent imaging.   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  Recent imaging has shown progression of the disease and patient's bone.  Oncology consulted for recommendations.  Palliative medicine team consulted to assist with symptom management.  Recommendations/Plan: # Complex medical decision making/goals of care:  - Discussed care with patient as detailed above in HPI.  It has already been recommended that patient consider  hospice support at home; agree with this recommendation due to patient's poor nutritional and functional status.  Patient having difficulty processing his medical illness and has stated that he does not currently want to pursue getting home  with hospice support.  Have explained how hospice does not mean there is a "timestamp" and if patient improved with hospice support, could follow-up with Dr. Mosetta Putt in the outpatient setting.  Patient continues to state he wants to "get better" in terms of regaining strength and improving nutrition to pursue further cancer directed therapies.  Palliative medicine team to continue engaging as able.  -  Code Status: Limited: Do not attempt resuscitation (DNR) -DNR-LIMITED -Do Not Intubate/DNI   # Symptom management: Patient is receiving these palliative interventions for symptom management with an intent to improve quality of life.   -Pain, acutely severe in setting of metastatic colon cancer with mets to bone, liver, and lung Within the past 24 hours hours, patient has required 30.75 mg of IV Dilaudid for opioid management. Based on OMEs calculated for this dose, which would be approximately 192 OME when reducing by 50% for incomplete cross tolerance, will appropriately start patient on the listed regimen below.    -Continue MS Contin to 60 mg every 8 hours.  Realize this will likely need to be further increased, trying to titrate up slowly.   -Continue PCA with IV Dilaudid 0.75 mg every 10 minutes bolus.  No continuous infusion as on long-acting oral opioids.   -Increase IV dexamethasone 4 mg every 6 hours due to edema noted on MRI   -Continue Celebrex 200 mg twice daily   -Consider use of bisphosphonate    -Radiation oncology offering short course of radiation to help with primary pain at T4 region   -Mood/appetite   -Personally reviewed EKG which noted QTc on 11/09/2023 to be 432   -Increase olanzapine to 5 mg nightly  # Psychosocial Support:  -Brother, mother  # Discharge Planning: To Be Determined  Discussed with: Hospitalist, RN, patient, patient's brother present at bedside, oncologist, radiation oncologist  Thank you for allowing the palliative care team to participate in the care [name].  Alvester Morin, DO Palliative Care Provider PMT # [phone]  If patient remains symptomatic despite maximum doses, please call PMT at [phone] between 0700 and 1900. Outside of these hours, please call attending, as PMT does not have night coverage.

## 2023-11-12 NOTE — Progress Notes (Signed)
 Progress Note    Joseph Haney   QIO:[id number]  DOB: 08-18-1976  DOA: 11/06/2023     6 PCP: Frederic Jericho, [name]  Initial CC: pain, SOB  Hospital Course: Joseph Haney is a 48 yo male with PMH metastatic colon cancer (lung, liver, bones), worsening debility/weakness, GERD who initially presented with shortness of breath, weakness, and uncontrolled pain. He has had gradual decline since December 2024 spending majority of time in bed and having ongoing anorexia with weight loss. He was admitted for pain control.  Palliative care and oncology were consulted on admission as well.  Interval History:  Reviewed MRI findings with patient and he also has met with radiation oncology this morning. He seems to be leaning towards radiation but plans to talk more with his mom about it tonight. Tentatively would start tomorrow.  Pain control still ongoing and remains on quite a bit of dilaudid via PCA.   Assessment and Plan: * Cancer associated pain - Palliative care consulted for assistance with pain management and GOC discussions - Currently on Dilaudid PCA with MS Contin being continuously adjusted  Stage IV carcinoma of colon (HCC) - follows with Dr. Mosetta Putt; was noted to have been too weak to follow up with oncology in the office as well recently  - with poor performance status, malnutrition, and tolerance, he has been recommended for transition to hospice but he is not yet ready - status has been updated to DNR at this time but main focus still remains pain control, but he's hopeful to walk more again; and still hopeful for "treatment again one day" - MRI T/L spine obtained 3/12 showing further metastatic lesions ("diffuse osseous disease in lumbar, lower thoracic, and posterior pelvis"; also noted with tumor extending into anterior epidural spaces along T12-L3 - evaluated by rad-onc on 3/13; suspect some of the lesion at T2-T4 may be amenable to radiation; patient planning to discuss  further with his mom tonight, but tentative plan for radiation beginning 3/14 - decadron started 3/13 per palliative care as well   Goals of care, counseling/discussion - palliative care following and continuing discussions as needed  Generalized weakness - Suspect progressive functional decline from underlying cancer but also due to patient being mostly bedridden at home -Given lower extremity weakness with Stage IV colon cancer, MRI T and L-spine ordered (see cancer)  Hypokalemia - replete as needed  Elevated LFTs - related to metastasis   HTN (hypertension) - continue coreg  Protein-calorie malnutrition, severe (HCC) - Body mass index is 21.28 kg/m. - continued poor intake and weight loss   Old records reviewed in assessment of this patient  Antimicrobials:   DVT prophylaxis:  enoxaparin (LOVENOX) injection 40 mg Start: 11/07/23 1000   Code Status:   Code Status: Limited: Do not attempt resuscitation (DNR) -DNR-LIMITED -Do Not Intubate/DNI   Mobility Assessment (Last 72 Hours)     Mobility Assessment     Row Name 11/12/23 0701 11/11/23 1925 11/11/23 1143 11/11/23 1057 11/11/23 0740   Does patient have an order for bedrest or is patient medically unstable No - Continue assessment No - Continue assessment -- -- No - Continue assessment   What is the highest level of mobility based on the progressive mobility assessment? Level 2 (Chairfast) - Balance while sitting on edge of bed and cannot stand Level 2 (Chairfast) - Balance while sitting on edge of bed and cannot stand Level 1 (Bedfast) - Unable to balance while sitting on edge of bed Level 1 (Bedfast) -  Unable to balance while sitting on edge of bed Level 3 (Stands with assist) - Balance while standing  and cannot march in place   Is the above level different from baseline mobility prior to current illness? Yes - Recommend PT order Yes - Recommend PT order -- -- No - Consider discontinuing PT/OT    Row Name 11/10/23  2034 11/10/23 0900 11/09/23 2230       Does patient have an order for bedrest or is patient medically unstable No - Continue assessment No - Continue assessment No - Continue assessment     What is the highest level of mobility based on the progressive mobility assessment? Level 3 (Stands with assist) - Balance while standing  and cannot march in place Level 3 (Stands with assist) - Balance while standing  and cannot march in place Level 3 (Stands with assist) - Balance while standing  and cannot march in place              Barriers to discharge: none Disposition Plan:  Home  HH orders placed:  Status is: Inpt  Objective: Blood pressure (!) 106/56, pulse 90, temperature 98.4 F (36.9 C), temperature source Oral, resp. rate 18, height 5\' 4"  (1.626 m), weight 56.2 kg, SpO2 99%.  Examination:  Physical Exam Constitutional:      General: He is not in acute distress.    Comments: Thin and in no distress  HENT:     Head: Normocephalic and atraumatic.     Mouth/Throat:     Mouth: Mucous membranes are moist.  Eyes:     Extraocular Movements: Extraocular movements intact.  Cardiovascular:     Rate and Rhythm: Normal rate and regular rhythm.  Pulmonary:     Effort: Pulmonary effort is normal. No respiratory distress.     Breath sounds: Normal breath sounds. No wheezing.  Abdominal:     General: Bowel sounds are normal. There is no distension.     Palpations: Abdomen is soft.     Tenderness: There is no abdominal tenderness.  Musculoskeletal:        General: Normal range of motion.     Cervical back: Normal range of motion and neck supple.  Skin:    General: Skin is warm and dry.  Neurological:     Mental Status: He is alert.     Motor: Weakness (1-2/5 LE strength) present.  Psychiatric:        Attention and Perception: Attention normal.        Mood and Affect: Mood is depressed.        Speech: Speech normal.        Behavior: Behavior normal.        Thought Content: Thought  content normal.      Consultants:  Oncology Palliative care  Radiation oncology   Procedures:    Data Reviewed: No results found for this or any previous visit (from the past 24 hours).  I have reviewed pertinent nursing notes, vitals, labs, and images as necessary. I have ordered labwork to follow up on as indicated.  I have reviewed the last notes from staff over past 24 hours. I have discussed patient's care plan and test results with nursing staff, CM/SW, and other staff as appropriate.  Time spent: Greater than 50% of the 55 minute visit was spent in counseling/coordination of care for the patient as laid out in the A&P.   LOS: 6 days   Lewie Chamber, MD [facility] 11/12/2023, 2:35 PM

## 2023-11-12 NOTE — Plan of Care (Signed)

## 2023-11-12 NOTE — Progress Notes (Signed)
 Radiation Oncology         [phone] ________________________________  Initial inpatient Consultation  Name: Joseph Haney MRN: [id number]  Date of Service: 11/12/2023 DOB: [birth year]  IR:JJOACZ, Purvis Kilts, [name]  Malachy Mood, MD   REFERRING PHYSICIAN: Malachy Mood, MD  DIAGNOSIS: There were no encounter diagnoses.  No diagnosis found.  HISTORY OF PRESENT ILLNESS: Joseph Haney is a 48 y.o. male seen at the request of Dr. Mosetta Putt. In summary, he initially presented with abdominal distention, and subsequent CT A/P showed concerning findings in proximal ascending colon. He underwent colonoscopy on 07/22/21 showing a stricture in the distal ascending colon, and pathology confirmed moderately differentiated invasive colorectal adenocarcinoma. He was taken for partial colectomy the following day, with final pathology showing 4.5 cm stage pT4aN2b adenocarcinoma. There was invasion into pericolonic connective tissue and focally to serosal surface, but final margins were negative. He was seen by Dr. Mosetta Putt and treated with CapeOx from 09/11/21 through 01/15/22. The oxaliplatin was discontinued due to intolerance, and he received Xeloda along through 01/2022. 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 A biopsy was attempted but deemed not feasible. He was treated with chemotherapy with FOLFIRI from 10/20/22 through 12/2022. He was supposed to undergo resection with Dr. Gwenlyn Perking at [facility], but the patient postponed the procedure due to personal issues. When he underwent a restaging CT scan on 04/16/23 at [facility], he was found to have a possible new bone metastasis within the right acetabulum, thus his resection surgery was cancelled. 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 He received three additional cycles of CapeOx with  bevacizumab from 05/25/23 through 07/27/23 but did not return for further treatment after poor tolerance of chemo.  Most  recently, he presented to the ED on 11/06/23 with significant pain, with nausea, fatigue, and anorexia. A CT C/A/P revealed: new and enlarged pulmonary nodules; interval enlargement of a hypodense metastasis of medial liver dome with multiple new small satellite metastases; diffusely heterogeneous mixed lytic and sclerotic osseous metastatic disease throughout axial skeleton, significantly increased compared to prior exam. He underwent further evaluation with lumbar and thoracic spine MRI scans yesterday, 11/11/23, showing: diffuse osseous metastatic disease throughout imaged spine and included posterior pelvis; bulging of posterior walls of majority of thoracic and of T12 through L3 vertebral bodies with small amount of tumor extending into anterior epidural spaces, most pronounced at T4 level; acute pathologic fracture of T4 vertebral body; subtle superior endplate compression deformities at T5, T6, T7, T9, L1, and L2.  PREVIOUS RADIATION THERAPY: No  PAST MEDICAL HISTORY:  Past Medical History:  Diagnosis Date   Alcohol abuse    Cancer (HCC)    GERD (gastroesophageal reflux disease)    Seizure (HCC)       PAST SURGICAL HISTORY: Past Surgical History:  Procedure Laterality Date   BIOPSY  07/22/2021   Procedure: BIOPSY;  Surgeon: Kathi Der, MD;  Location: [facility];  Service: Gastroenterology;;   COLON SURGERY     COLONOSCOPY WITH PROPOFOL N/A 07/22/2021   Procedure: COLONOSCOPY WITH PROPOFOL;  Surgeon: Kathi Der, MD;  Location: [facility];  Service: Gastroenterology;  Laterality: N/A;   IR IMAGING GUIDED PORT INSERTION  10/17/2022   NO PAST SURGERIES     SUBMUCOSAL TATTOO INJECTION  07/22/2021   Procedure: SUBMUCOSAL TATTOO INJECTION;  Surgeon: Kathi Der, MD;  Location: [facility];  Service: Gastroenterology;;    FAMILY HISTORY:  Family History  Problem Relation Age of  Onset   Coronary artery disease Mother    Coronary artery disease Father    Heart attack  Father 77   Cancer Cousin 66       prostate    SOCIAL HISTORY:  Social History   Socioeconomic History   Marital status: Married    Spouse name: Not on file   Number of children: 3   Years of education: Not on file   Highest education level: Not on file  Occupational History   Not on file  Tobacco Use   Smoking status: Never   Smokeless tobacco: Never  Vaping Use   Vaping status: Never Used  Substance and Sexual Activity   Alcohol use: Not Currently    Comment: drank from age 71 - 63, quit 7 years ago   Drug use: No   Sexual activity: Yes  Other Topics Concern   Not on file  Social History Narrative   Not on file   Social Drivers of Health   Financial Resource Strain: Not on file  Food Insecurity: No Food Insecurity (11/07/2023)   Hunger Vital Sign    Worried About Running Out of Food in the Last Year: Never true    Ran Out of Food in the Last Year: Never true  Transportation Needs: Unmet Transportation Needs (11/07/2023)   PRAPARE - Administrator, Civil Service (Medical): No    Lack of Transportation (Non-Medical): Yes  Physical Activity: Not on file  Stress: Not on file  Social Connections: Unknown (11/07/2023)   Social Connection and Isolation Panel [NHANES]    Frequency of Communication with Friends and Family: Three times a week    Frequency of Social Gatherings with Friends and Family: More than three times a week    Attends Religious Services: Patient unable to answer    Active Member of Clubs or Organizations: No    Attends Banker Meetings: Patient unable to answer    Marital Status: Widowed  Intimate Partner Violence: Not At Risk (11/07/2023)   Humiliation, Afraid, Rape, and Kick questionnaire    Fear of Current or Ex-Partner: No    Emotionally Abused: No    Physically Abused: No    Sexually Abused: No    ALLERGIES: Latex  MEDICATIONS:  No current facility-administered medications for this encounter.   No current outpatient  medications on file.   Facility-Administered Medications Ordered in Other Encounters  Medication Dose Route Frequency Provider Last Rate Last Admin   acetaminophen (TYLENOL) tablet 1,000 mg  1,000 mg Oral Q8H Zigmund Daniel., MD   1,000 mg at 11/12/23 0535   carvedilol (COREG) tablet 25 mg  25 mg Oral BID WC Earlie Lou L, MD   25 mg at 11/12/23 5409   celecoxib (CELEBREX) capsule 200 mg  200 mg Oral BID Alena Bills, DO   200 mg at 11/12/23 8119   Chlorhexidine Gluconate Cloth 2 % PADS 6 each  6 each Topical Daily Zigmund Daniel., MD   6 each at 11/12/23 0906   dexamethasone (DECADRON) injection 4 mg  4 mg Intravenous Q6H Mims, Lauren W, DO   4 mg at 11/12/23 1001   enoxaparin (LOVENOX) injection 40 mg  40 mg Subcutaneous Q24H Earlie Lou L, MD   40 mg at 11/12/23 0906   feeding supplement (ENSURE ENLIVE / ENSURE PLUS) liquid 237 mL  237 mL Oral BID BM Rometta Emery, MD   237 mL at 11/10/23 0951   HYDROmorphone (  DILAUDID) 1 mg/mL PCA injection   Intravenous Q4H Rosalin Hawking, MD   4.5 mg at 11/12/23 0643   morphine (MS CONTIN) 12 hr tablet 60 mg  60 mg Oral Q8H Mims, Lauren W, DO   60 mg at 11/12/23 0535   naloxone (NARCAN) injection 0.4 mg  0.4 mg Intravenous PRN Rosalin Hawking, MD       And   sodium chloride flush (NS) 0.9 % injection 9 mL  9 mL Intravenous PRN Rosalin Hawking, MD       OLANZapine (ZYPREXA) tablet 2.5 mg  2.5 mg Oral QHS Mims, Lauren W, DO   2.5 mg at 11/11/23 2152   ondansetron (ZOFRAN) injection 4 mg  4 mg Intravenous Q6H PRN Rosalin Hawking, MD       ondansetron (ZOFRAN) tablet 4 mg  4 mg Oral Q6H PRN Rometta Emery, MD       pantoprazole (PROTONIX) EC tablet 40 mg  40 mg Oral Daily Zigmund Daniel., MD   40 mg at 11/12/23 1610   potassium chloride SA (KLOR-CON M) CR tablet 20 mEq  20 mEq Oral Daily Rometta Emery, MD   20 mEq at 11/12/23 9604    REVIEW OF SYSTEMS:  On review of systems, the patient reports that he is doing well overall. He  denies any chest pain, shortness of breath, cough, fevers, chills, night sweats, unintended weight changes. He denies any bowel or bladder disturbances, and denies abdominal pain, nausea or vomiting. He denies any new musculoskeletal or joint aches or pains.*** A complete review of systems is obtained and is otherwise negative.    PHYSICAL EXAM:  Wt Readings from Last 3 Encounters:  11/06/23 124 lb (56.2 kg)  07/27/23 162 lb 3 oz (73.6 kg)  06/15/23 169 lb 8 oz (76.9 kg)   Temp Readings from Last 3 Encounters:  11/12/23 98.1 F (36.7 C) (Oral)  07/27/23 98.1 F (36.7 C) (Temporal)  06/15/23 98.1 F (36.7 C) (Oral)   BP Readings from Last 3 Encounters:  11/12/23 128/74  07/27/23 (!) 130/96  06/15/23 (!) 141/95   Pulse Readings from Last 3 Encounters:  11/12/23 87  07/27/23 90  06/15/23 87    /10  In general this is a well appearing *** man in no acute distress. He's alert and oriented x4 and appropriate throughout the examination. Cardiopulmonary assessment is negative for acute distress and he exhibits normal effort.     KPS = ***  100 - Normal; no complaints; no evidence of disease. 90   - Able to carry on normal activity; minor signs or symptoms of disease. 80   - Normal activity with effort; some signs or symptoms of disease. 79   - Cares for self; unable to carry on normal activity or to do active work. 60   - Requires occasional assistance, but is able to care for most of his personal needs. 50   - Requires considerable assistance and frequent medical care. 40   - Disabled; requires special care and assistance. 30   - Severely disabled; hospital admission is indicated although death not imminent. 20   - Very sick; hospital admission necessary; active supportive treatment necessary. 10   - Moribund; fatal processes progressing rapidly. 0     - Dead  Karnofsky DA, Abelmann WH, Craver LS and Burchenal [facility] [phone]) The use of the nitrogen mustards in the palliative  treatment of carcinoma: with particular reference to bronchogenic carcinoma Cancer 1 [id number]  LABORATORY DATA:  Lab Results  Component Value Date   WBC 14.4 (H) 11/10/2023   HGB 8.0 (L) 11/10/2023   HCT 27.0 (L) 11/10/2023   MCV 88.5 11/10/2023   PLT 238 11/10/2023   Lab Results  Component Value Date   NA 133 (L) 11/10/2023   K 4.6 11/10/2023   CL 99 11/10/2023   CO2 22 11/10/2023   Lab Results  Component Value Date   ALT 58 (H) 11/10/2023   AST 121 (H) 11/10/2023   ALKPHOS 1,285 (H) 11/10/2023   BILITOT 0.8 11/10/2023     RADIOGRAPHY: MR THORACIC SPINE W WO CONTRAST Result Date: 11/11/2023 CLINICAL DATA:  Back pain.  Metastatic colon cancer EXAM: MRI THORACIC WITHOUT AND WITH CONTRAST TECHNIQUE: Multiplanar and multiecho pulse sequences of the thoracic spine were obtained without and with intravenous contrast. CONTRAST:  5mL GADAVIST GADOBUTROL 1 MMOL/ML IV SOLN COMPARISON:  CT 11/06/2023 FINDINGS: Alignment:  Physiologic. Vertebrae: Diffuse marrow replacement throughout the thoracic spine with patchy enhancement on postcontrast sequences. Bulging of the posterior walls of the majority of the thoracic vertebral bodies with a small amount of enhancing tumor extending into the anterior epidural spaces. This is most pronounced at the T4 level on the right where a expansile lesion centered in the right aspect of the vertebral body and pedicle abuts the thoracic cord (series 20, image 12). The right foramina at T3-4 and T4-5 are narrowed by this process. Additional expansile bony lesions with extraosseous soft tissue component include the posterior left T5 rib and the posterior right T7 and T11 ribs. Pathologic fracture of the T4 vertebral body appears acute with approximately 40% vertebral body height loss. Subtle superior endplate compression deformities at T5, T6, T7, and T9. Cord: Increased T2 signal within the thoracic cord at the T3-4 level (series 19, image 14). Paraspinal and other soft  tissues: Right upper lobe pulmonary nodule (series 20, image 10). Disc levels: Canal stenosis at the level of the T4 vertebral body primarily secondary to mass effect from expansile tumor. No additional levels of significant canal stenosis. IMPRESSION: 1. 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 Bulging of the posterior walls of the majority of the thoracic vertebral bodies with a small amount of enhancing tumor extending into the anterior epidural spaces. This is most pronounced at the T4 level on the right where an expansile lesion centered in the right aspect of the vertebral body and pedicle abuts the thoracic cord. Increased T2 signal within the thoracic cord at the T3-4 level, likely reflecting cord edema. 2. Pathologic fracture of the T4 vertebral body appears acute. Subtle superior endplate compression deformities at T5, T6, T7, and T9. 3. Right upper lobe pulmonary nodule. Electronically Signed   By: Duanne Guess D.O.   On: 11/11/2023 18:59   MR Lumbar Spine W Wo Contrast Result Date: 11/11/2023 CLINICAL DATA:  Severe back pain and bilateral leg weakness. History of metastatic colon cancer EXAM: MRI LUMBAR SPINE WITHOUT AND WITH CONTRAST TECHNIQUE: Multiplanar and multiecho pulse sequences of the lumbar spine were obtained without and with intravenous contrast. CONTRAST:  5mL GADAVIST GADOBUTROL 1 MMOL/ML IV SOLN COMPARISON:  CT 11/06/2023, 02/19/2023 FINDINGS: Segmentation:  Standard. Alignment:  Physiologic. Vertebrae: Diffuse marrow replacement throughout the lumbar spine, lower thoracic spine, and included posterior pelvis with patchy postcontrast enhancement. Findings are compatible with diffuse osseous metastatic disease. There is slight bulging of the posterior walls of the T12 through L3 vertebral bodies with a small amount of tumor extending into the anterior epidural spaces  at these levels (see series 26, images 1-20). Subtle superior endplate compression fracture  of the L2 vertebral body, new since 02/19/2023. Assessment for associated bone marrow edema at this location is limited in the presence of diffuse marrow replacement. There is also a very subtle endplate depression at L1. Conus medullaris and cauda equina: Conus extends to the L1-L2 level. Conus and cauda equina appear normal. Paraspinal and other soft tissues: No acute abnormality. Disc levels: Mild degenerative disc disease at L5-S1 with annular disc bulge. No focal disc protrusion at any level. No canal stenosis within the lumbar spine. Mild bilateral foraminal stenosis at L5-S1, left greater than right. No additional level of foraminal narrowing. IMPRESSION: 1. 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 2. Slight bulging of the posterior walls of the T12 through L3 vertebral bodies with a small amount of tumor extending into the anterior epidural spaces at these levels. 3. Subtle superior endplate compression fracture of the L1 and L2 vertebral bodies. Assessment for associated bone marrow edema at this location is limited in the presence of diffuse marrow replacement. 4. Mild degenerative disc disease at L5-S1 with mild bilateral foraminal stenosis, left greater than right. 5. No canal stenosis within the lumbar spine. Electronically Signed   By: Duanne Guess D.O.   On: 11/11/2023 18:45   DG CHEST PORT 1 VIEW Result Date: 11/10/2023 CLINICAL DATA:  Metastatic colon cancer.  Chest pain. EXAM: PORTABLE CHEST 1 VIEW COMPARISON:  Chest radiograph dated 11/06/2023. FINDINGS: Left-sided Port-A-Cath in similar position. There is shallow inspiration with bibasilar linear and platelike atelectasis. No focal consolidation, pleural effusion, or pneumothorax. Stable cardiac silhouette. No acute osseous pathology. IMPRESSION: Shallow inspiration with bibasilar atelectasis. Electronically Signed   By: Elgie Collard M.D.   On: 11/10/2023 18:12    DG Chest 2 View Result Date: 11/06/2023 CLINICAL DATA:  Shortness of breath.  Weakness. EXAM: CHEST - 2 VIEW COMPARISON:  Subsequent chest CT, available at time of radiograph interpretation. FINDINGS: Right chest port in place. Lung volumes are low.The cardiomediastinal contours are normal. Right upper lobe nodule on CT is not well seen by radiograph. The additional small pulmonary nodules are also not seen. Pulmonary vasculature is normal. Subsegmental atelectasis at the bases. No confluent consolidation, pleural effusion, or pneumothorax. No acute osseous abnormalities are seen. IMPRESSION: 1. Pulmonary nodules on subsequent CT are not well demonstrated by radiograph. 2. Subsegmental atelectasis at the lung bases. Electronically Signed   By: Narda Rutherford M.D.   On: 11/06/2023 18:37   CT CHEST ABDOMEN PELVIS W CONTRAST Result Date: 11/06/2023 CLINICAL DATA:  Chest pain, abdominal pain, metastatic colon cancer * Tracking Code: BO * EXAM: CT CHEST, ABDOMEN, AND PELVIS WITH CONTRAST TECHNIQUE: Multidetector CT imaging of the chest, abdomen and pelvis was performed following the standard protocol during bolus administration of intravenous contrast. RADIATION DOSE REDUCTION: This exam was performed according to the departmental dose-optimization program which includes automated exposure control, adjustment of the mA and/or kV according to patient size and/or use of iterative reconstruction technique. CONTRAST:  OMNIPAQUE IOHEXOL 300 MG/ML  SOLN COMPARISON:  PET-CT, 06/29/2023 FINDINGS: CT CHEST FINDINGS Cardiovascular: Right chest port catheter. Normal heart size. Left coronary artery calcifications. No pericardial effusion. Mediastinum/Nodes: No enlarged mediastinal, hilar, or axillary lymph nodes. Thyroid gland, trachea, and esophagus demonstrate no significant findings. Lungs/Pleura: Interval enlargement and increased in solid character of a dominant spiculated nodule of the right upper lobe measuring  1.1 x 1.0  cm, previously 0.8 x 0.7 cm when measured similarly (series 6, image 26). Numerous additional small nodules, the majority very tiny, some of the larger nodules seen on prior examination slightly increased in size, for example in the medial left upper lobe measuring 0.5 cm, previously 0.2 cm (series 6, image 56). Bandlike scarring of the middle lobe and lingula. No pleural effusion or pneumothorax. Musculoskeletal: No chest wall abnormality. No acute osseous findings. CT ABDOMEN PELVIS FINDINGS Hepatobiliary: Interval enlargement of a hypodense metastasis of the medial liver dome, hepatic segment IV a, measuring 5.2 x 4.3 cm, previously 3.2 x 3.1 cm. Multiple additional new small satellite lesions. No gallstones, gallbladder wall thickening, or biliary dilatation. Pancreas: Unremarkable. No pancreatic ductal dilatation or surrounding inflammatory changes. Spleen: Normal in size without significant abnormality. Adrenals/Urinary Tract: Adrenal glands are unremarkable. Kidneys are normal, without renal calculi, solid lesion, or hydronephrosis. Bladder is unremarkable. Stomach/Bowel: Stomach is within normal limits. Status post partial right hemicolectomy. No evidence of bowel wall thickening, distention, or inflammatory changes. Vascular/Lymphatic: Aortic atherosclerosis. No enlarged abdominal or pelvic lymph nodes. Reproductive: No mass or other abnormality. Other: No abdominal wall hernia or abnormality. No ascites. Musculoskeletal: No acute osseous findings. Diffusely heterogeneous mixed lytic and sclerotic osseous metastatic disease throughout the axial skeleton, significantly increased in conspicuity compared to prior examination. IMPRESSION: 1. New and enlarged pulmonary nodules. 2. Interval enlargement of a hypodense metastasis of the medial liver dome with multiple new small satellite metastases. 3. Diffusely heterogeneous mixed lytic and sclerotic osseous metastatic disease throughout the axial  skeleton, significantly increased in conspicuity compared to prior examination. 4. Constellation of findings is consistent with worsened, widespread metastatic disease. 5. Status post partial right hemicolectomy. 6. Coronary artery disease. Aortic Atherosclerosis (ICD10-I70.0). Electronically Signed   By: Jearld Lesch M.D.   On: 11/06/2023 17:34      IMPRESSION/PLAN: 1. 48 y.o. man with painful osseous metastatic disease from stage pT4aN2b ascending colon cancer***  Today, we talked to the patient and family about the findings and workup thus far. We discussed the natural history of metastatic colon cancer and general treatment, highlighting the role of radiotherapy in the management of painful osseous disease. We discussed the available radiation techniques, and focused on the details and logistics of delivery. We reviewed the anticipated acute and late sequelae associated with radiation in this setting. The patient was encouraged to ask questions that were answered to his satisfaction.  At the end of our conversation, the patient ***   I personally spent *** minutes in this encounter including chart review, reviewing radiological studies, meeting face-to-face with the patient, entering orders and completing documentation.   ------------------------------------------------   Margaretmary Dys, MD [facility]  Radiation Oncology Direct Dial: [phone]  Fax: [phone] [email or web address]  Skype  [email or web address]   This document serves as a record of services personally performed by Margaretmary Dys, MD. It was created on his behalf by Mickie Bail, a trained medical scribe. The creation of this record is based on the scribe's personal observations and the provider's statements to them. This document has been checked and approved by the attending provider.

## 2023-11-13 ENCOUNTER — Inpatient Hospital Stay (HOSPITAL_COMMUNITY)

## 2023-11-13 ENCOUNTER — Other Ambulatory Visit: Payer: Self-pay

## 2023-11-13 ENCOUNTER — Ambulatory Visit
Admit: 2023-11-13 | Discharge: 2023-11-13 | Disposition: A | Discharge status: Home / Self Care | Attending: Radiation Oncology | Admitting: Radiation Oncology

## 2023-11-13 DIAGNOSIS — R630 Anorexia: Secondary | ICD-10-CM | POA: Diagnosis not present

## 2023-11-13 DIAGNOSIS — C189 Malignant neoplasm of colon, unspecified: Secondary | ICD-10-CM

## 2023-11-13 DIAGNOSIS — K5903 Drug induced constipation: Secondary | ICD-10-CM

## 2023-11-13 DIAGNOSIS — G893 Neoplasm related pain (acute) (chronic): Secondary | ICD-10-CM | POA: Diagnosis not present

## 2023-11-13 DIAGNOSIS — Z515 Encounter for palliative care: Secondary | ICD-10-CM | POA: Diagnosis not present

## 2023-11-13 LAB — RAD ONC ARIA SESSION SUMMARY
Course Elapsed Days: 0
Plan Fractions Treated to Date: 1
Plan Prescribed Dose Per Fraction: 5 Gy
Plan Total Fractions Prescribed: 4
Plan Total Prescribed Dose: 20 Gy
Reference Point Dosage Given to Date: 5 Gy
Reference Point Session Dosage Given: 5 Gy
Session Number: 1

## 2023-11-13 MED ORDER — SODIUM CHLORIDE 0.9% FLUSH
10.0000 mL | INTRAVENOUS | Status: DC | PRN
  Administered 2023-12-01: 10 mL

## 2023-11-13 MED ORDER — MORPHINE SULFATE ER 100 MG PO TBCR: 100.0000 mg | EXTENDED_RELEASE_TABLET | Freq: Three times a day (TID) | ORAL | Status: DC

## 2023-11-13 MED ORDER — SODIUM CHLORIDE 0.9% FLUSH
10.0000 mL | Freq: Two times a day (BID) | INTRAVENOUS | Status: DC
  Administered 2023-11-13 – 2023-11-23 (×19): 10 mL
  Administered 2023-11-23: 20 mL
  Administered 2023-11-24 – 2023-11-27 (×7): 10 mL
  Administered 2023-11-27: 20 mL
  Administered 2023-11-28: 10 mL
  Administered 2023-11-28: 20 mL
  Administered 2023-11-29: 10 mL
  Administered 2023-11-30: 20 mL
  Administered 2023-12-01 – 2023-12-03 (×5): 10 mL

## 2023-11-13 MED ORDER — MORPHINE SULFATE ER 30 MG PO TBCR
90.0000 mg | EXTENDED_RELEASE_TABLET | Freq: Three times a day (TID) | ORAL | Status: DC
  Administered 2023-11-13 – 2023-11-24 (×33): 90 mg via ORAL
  Filled 2023-11-13 (×28): qty 3
  Filled 2023-11-13 (×2): qty 6
  Filled 2023-11-13 (×3): qty 3

## 2023-11-13 MED ORDER — SENNA 8.6 MG PO TABS
1.0000 | ORAL_TABLET | Freq: Two times a day (BID) | ORAL | Status: DC
  Administered 2023-11-13 – 2023-12-03 (×27): 8.6 mg via ORAL
  Filled 2023-11-13 (×34): qty 1

## 2023-11-13 MED ORDER — ZINC OXIDE 12.8 % EX OINT
TOPICAL_OINTMENT | CUTANEOUS | Status: DC | PRN
  Administered 2023-11-14: 1 via TOPICAL
  Filled 2023-11-13 (×2): qty 56.7

## 2023-11-13 MED ORDER — HYDROMORPHONE HCL 1 MG/ML IJ SOLN
1.0000 mg | Freq: Once | INTRAMUSCULAR | Status: AC | PRN
  Administered 2023-11-13: 1 mg via INTRAVENOUS
  Filled 2023-11-13: qty 1

## 2023-11-13 NOTE — Plan of Care (Signed)

## 2023-11-13 NOTE — Progress Notes (Signed)
 Patient returned from CT simulation with loss of IV in Left forearm. Left forearm +3 edema, warm, pulse+3, denies N/T, at 1013. MD notified.   Patient reported numbness in LUE at 1804. MD notified. Ct scan ordered.

## 2023-11-13 NOTE — Progress Notes (Signed)
 PT Cancellation Note  Patient Details Name: Joseph Haney MRN: [id number] DOB: [birth year]   Cancelled Treatment:    Reason Eval/Treat Not Completed: Fatigue/lethargy limiting ability to participate;Pain limiting ability to participate. Pt reports fatigued from going for simulation and treatment planning; also reports pain and requesting to rest until going for radiation this afternoon. Will continue to follow acutely and assess for d/c recs.    Domenick Bookbinder PT, DPT 11/13/23, 12:43 PM

## 2023-11-13 NOTE — Progress Notes (Signed)
 Progress Note    Joseph Haney   ZOX:[id number]  DOB: 1976/04/09  DOA: 11/06/2023     7 PCP: Frederic Jericho, [name]  Initial CC: pain, SOB  Hospital Course: Mr. Kluever is a 48 yo male with PMH metastatic colon cancer (lung, liver, bones), worsening debility/weakness, GERD who initially presented with shortness of breath, weakness, and uncontrolled pain. He has had gradual decline since December 2024 spending majority of time in bed and having ongoing anorexia with weight loss. He was admitted for pain control.  Palliative care and oncology were consulted on admission as well.  Interval History:  No events overnight.  Underwent CT simulation this morning.  IV infiltrated in the left arm. Accessing port.  First radiation treatment planned for this afternoon.  Assessment and Plan: * Cancer associated pain - Palliative care consulted for assistance with pain management and GOC discussions - Currently on Dilaudid PCA with MS Contin being continuously adjusted  Stage IV carcinoma of colon (HCC) - follows with Dr. Mosetta Putt; was noted to have been too weak to follow up with oncology in the office as well recently  - with poor performance status, malnutrition, and tolerance, he has been recommended for transition to hospice but he is not yet ready - status has been updated to DNR at this time but main focus still remains pain control, but he's hopeful to walk more again; and still hopeful for "treatment again one day" - MRI T/L spine obtained 3/12 showing further metastatic lesions ("diffuse osseous disease in lumbar, lower thoracic, and posterior pelvis"; also noted with tumor extending into anterior epidural spaces along T12-L3 - evaluated by rad-onc on 3/13; suspect some of the lesion at T2-T4 may be amenable to radiation - decadron started 3/13 per palliative care as well  - CT sim done on 3/14 and radiation starting 3/14 with plan for 4 fractions (end on 3/19)  Goals of care,  counseling/discussion - palliative care following and continuing discussions as needed  Generalized weakness - Suspect progressive functional decline from underlying cancer but also due to patient being mostly bedridden at home -Given lower extremity weakness with Stage IV colon cancer, MRI T and L-spine ordered (see cancer)  Hypokalemia - replete as needed  Drug-induced constipation - continue senna  Elevated LFTs - related to metastasis   HTN (hypertension) - continue coreg  Protein-calorie malnutrition, severe (HCC) - Body mass index is 21.28 kg/m. - continued poor intake and weight loss   Old records reviewed in assessment of this patient  Antimicrobials:   DVT prophylaxis:  enoxaparin (LOVENOX) injection 40 mg Start: 11/07/23 1000   Code Status:   Code Status: Limited: Do not attempt resuscitation (DNR) -DNR-LIMITED -Do Not Intubate/DNI   Mobility Assessment (Last 72 Hours)     Mobility Assessment     Row Name 11/12/23 1915 11/12/23 0701 11/11/23 1925 11/11/23 1143 11/11/23 1057   Does patient have an order for bedrest or is patient medically unstable No - Continue assessment No - Continue assessment No - Continue assessment -- --   What is the highest level of mobility based on the progressive mobility assessment? Level 2 (Chairfast) - Balance while sitting on edge of bed and cannot stand Level 2 (Chairfast) - Balance while sitting on edge of bed and cannot stand Level 2 (Chairfast) - Balance while sitting on edge of bed and cannot stand Level 1 (Bedfast) - Unable to balance while sitting on edge of bed Level 1 (Bedfast) - Unable to balance while sitting  on edge of bed   Is the above level different from baseline mobility prior to current illness? Yes - Recommend PT order Yes - Recommend PT order Yes - Recommend PT order -- --    Row Name 11/11/23 0740 11/10/23 2034         Does patient have an order for bedrest or is patient medically unstable No - Continue  assessment No - Continue assessment      What is the highest level of mobility based on the progressive mobility assessment? Level 3 (Stands with assist) - Balance while standing  and cannot march in place Level 3 (Stands with assist) - Balance while standing  and cannot march in place      Is the above level different from baseline mobility prior to current illness? No - Consider discontinuing PT/OT --               Barriers to discharge: none Disposition Plan:  Home  HH orders placed:  Status is: Inpt  Objective: Blood pressure 110/67, pulse 90, temperature 98.1 F (36.7 C), resp. rate 14, height 5\' 4"  (1.626 m), weight 56.2 kg, SpO2 99%.  Examination:  Physical Exam Constitutional:      General: He is not in acute distress.    Comments: Thin and in no distress  HENT:     Head: Normocephalic and atraumatic.     Mouth/Throat:     Mouth: Mucous membranes are moist.  Eyes:     Extraocular Movements: Extraocular movements intact.  Cardiovascular:     Rate and Rhythm: Normal rate and regular rhythm.  Pulmonary:     Effort: Pulmonary effort is normal. No respiratory distress.     Breath sounds: Normal breath sounds. No wheezing.  Abdominal:     General: Bowel sounds are normal. There is no distension.     Palpations: Abdomen is soft.     Tenderness: There is no abdominal tenderness.  Musculoskeletal:        General: Normal range of motion.     Cervical back: Normal range of motion and neck supple.     Comments: Left forearm mildly swollen with mild TTP.  No erythema or calor appreciated  Skin:    General: Skin is warm and dry.  Neurological:     Mental Status: He is alert.     Motor: Weakness (1-2/5 LE strength) present.  Psychiatric:        Attention and Perception: Attention normal.        Mood and Affect: Mood is depressed.        Speech: Speech normal.        Behavior: Behavior normal.        Thought Content: Thought content normal.      Consultants:   Oncology Palliative care  Radiation oncology   Procedures:    Data Reviewed: No results found for this or any previous visit (from the past 24 hours).  I have reviewed pertinent nursing notes, vitals, labs, and images as necessary. I have ordered labwork to follow up on as indicated.  I have reviewed the last notes from staff over past 24 hours. I have discussed patient's care plan and test results with nursing staff, CM/SW, and other staff as appropriate.  Time spent: Greater than 50% of the 55 minute visit was spent in counseling/coordination of care for the patient as laid out in the A&P.   LOS: 7 days   Lewie Chamber, MD [facility] 11/13/2023, 1:53 PM

## 2023-11-13 NOTE — Progress Notes (Signed)
  Radiation Oncology         [phone] ________________________________  Name: Joseph Haney MRN: [id number]  Date: 11/13/2023  DOB: [birth year]  INPATIENT  SIMULATION AND TREATMENT PLANNING NOTE    ICD-10-CM   1. 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 (HCC)  C18.9    C79.51       DIAGNOSIS:  48 y.o. patient with painful upper thoracic spinal metastases  NARRATIVE:  The patient was brought to the CT Simulation planning suite.  Identity was confirmed.  All relevant records and images related to the planned course of therapy were reviewed.  The patient freely provided informed written consent to proceed with treatment after reviewing the details related to the planned course of therapy. The consent form was witnessed and verified by the simulation staff.  Then, the patient was set-up in a stable reproducible  supine position for radiation therapy.  CT images were obtained.  Surface markings were placed.  The CT images were loaded into the planning software.  Then the target and avoidance structures were contoured including lungs and spinal cord.  Treatment planning then occurred.  The radiation prescription was entered and confirmed.  Then, I designed and supervised the construction of a total of 3 medically necessary complex treatment devices with body positioner and MLCs to shield organs at risk.  I have requested : 3D Simulation  I have requested a DVH of the following structures: Left lung, Right lung and target.  PLAN:  The patient will receive 20 Gy in 4 fractions.  Anticipate start today and finish on Wednesday 11/18/23  ________________________________  Artist Pais. Kathrynn Running, M.D.

## 2023-11-13 NOTE — Assessment & Plan Note (Signed)
-   continue senna

## 2023-11-13 NOTE — Progress Notes (Signed)
 Daily Progress Note   Patient Name: Joseph Haney       Date: 11/13/2023 DOB: 14-Sep-1975  Age: 48 y.o. MRN#: [id number] Attending Physician: Lewie Chamber, MD Primary Care Physician: Frederic Jericho, [name] Admit Date: 11/06/2023 Length of Stay: 7 days  Reason for Consultation/Follow-up: Non pain symptom management and Pain control  Subjective:   CC: Patient went for radiation simulation this morning.  Palliative medicine team following up regarding symptom management.  Subjective:  Reviewed EMR prior to presenting to bedside.  At time of EMR review in past 24 hours patient has received IV Dilaudid via PCA bolus dose that 0.75 mg x 44 boluses.  Patient has continued to receive scheduled MS Contin 60 mg every 8 and dexamethasone 4 mg every 6 hours.  Reviewed documentation from radiation oncologist.  Noted vitals remained stable without noted episodes of hypotension.  Presented to bedside to see patient.  Patient laying in bed noting he had just returned from CT simulation.  Patient able to introduce his son, Joseph Haney, who was at bedside.  Discussed patient is planning to start radiation this afternoon.  Discussed pain management at this time.  Hoping radiation will be able to improve pain at T4 lesion.  Discussed based on patient's only requirements from IV Dilaudid PCA in past 24 hours, can increase long-acting morphine.  Patient agrees with this plan and denies any concerns regarding lethargy or confusion.  Currently noted patient lost IV access down at CT and so awaiting replacement of IV so patient can be on PCA. Discussed management of constipation as well.  Patient noted his last bowel movement was yesterday.  Patient denies any symptoms of constipation at this time.  Discussed importance of having regular bowel movements while receiving opioids.  Spent time answering questions as able.  Noted palliative medicine team will continue to follow with patient's medical  journey.  Objective:   Vital Signs:  BP 110/67 (BP Location: Right Arm)   Pulse 90   Temp 98.1 F (36.7 C)   Resp 17   Ht 5\' 4"  (1.626 m)   Wt 56.2 kg   SpO2 99%   BMI 21.28 kg/m   Physical Exam: General: NAD, alert, cachectic, chronically ill-appearing Cardiovascular: RRR Respiratory: no increased work of breathing noted, not in respiratory distress Neuro: A&Ox4, following commands easily Psych: appropriately answers all questions  Imaging:  I personally reviewed recent imaging.   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  Recent imaging has shown progression of the disease and patient's bone.  Oncology consulted for recommendations.  Palliative medicine team consulted to assist with symptom management.  Recommendations/Plan: # Complex medical decision making/goals of care:  - Discussed care with patient as detailed above in HPI.  It has already been recommended that patient consider hospice support at home; agree with this recommendation due to patient's poor nutritional and functional status.  Patient having difficulty processing his medical illness and has stated that he does not currently want to pursue getting home with hospice support.  Patient going to receive palliative radiation to assist with pain management at this time.  Palliative medicine team to continue engaging as able.  -  Code Status: Limited: Do not attempt resuscitation (DNR) -DNR-LIMITED -Do Not Intubate/DNI   # Symptom management: Patient is receiving these palliative interventions for symptom  management with an intent to improve quality of life.   -Pain, severe in setting of metastatic colon cancer with mets to bone, liver, and lung Within the past 24 hours hours, patient has required 33 mg mg of IV Dilaudid  for opioid management. Based on OMEs calculated for this dose, which would be approximately 206 OME when reducing by 50% for incomplete cross tolerance, will appropriately start patient on the listed regimen below.    -Increase MS Contin to 90 mg every 8 hours.     -Continue PCA with IV Dilaudid 0.75 mg every 10 minutes bolus.  No continuous infusion as on long-acting oral opioids.   -Continue IV dexamethasone 4 mg every 6 hours due to edema noted on MRI   -Continue Celebrex 200 mg twice daily.  Patient receiving Protonix 40 mg daily.   -Consider use of bisphosphonate    -Agree with radiation course to assist with pain management as unfortunately opioids have difficulty managing bone related cancer pain without requiring adverse effects such as sedation to obtain pain management   -Mood/appetite   -Personally reviewed EKG which noted QTc on 11/09/2023 to be 432   -Continue olanzapine to 5 mg nightly   -Constipation   -Start senna 1 tab twice daily  # Psychosocial Support:  -Brother, mother  # Discharge Planning: To Be Determined  Discussed with: Patient, patient's son, RN, hospitalist  Thank you for allowing the palliative care team to participate in the care [name].  Alvester Morin, DO Palliative Care Provider PMT # [phone]  If patient remains symptomatic despite maximum doses, please call PMT at [phone] between 0700 and 1900. Outside of these hours, please call attending, as PMT does not have night coverage.

## 2023-11-14 DIAGNOSIS — R531 Weakness: Secondary | ICD-10-CM | POA: Diagnosis not present

## 2023-11-14 DIAGNOSIS — Z79899 Other long term (current) drug therapy: Secondary | ICD-10-CM | POA: Diagnosis not present

## 2023-11-14 DIAGNOSIS — Z7189 Other specified counseling: Secondary | ICD-10-CM | POA: Diagnosis not present

## 2023-11-14 DIAGNOSIS — C189 Malignant neoplasm of colon, unspecified: Secondary | ICD-10-CM | POA: Diagnosis not present

## 2023-11-14 DIAGNOSIS — M7989 Other specified soft tissue disorders: Secondary | ICD-10-CM

## 2023-11-14 DIAGNOSIS — R4589 Other symptoms and signs involving emotional state: Secondary | ICD-10-CM | POA: Diagnosis not present

## 2023-11-14 DIAGNOSIS — Z515 Encounter for palliative care: Secondary | ICD-10-CM | POA: Diagnosis not present

## 2023-11-14 DIAGNOSIS — G893 Neoplasm related pain (acute) (chronic): Secondary | ICD-10-CM | POA: Diagnosis not present

## 2023-11-14 NOTE — Progress Notes (Signed)
 Daily Progress Note   Patient Name: Joseph Haney       Date: 11/14/2023 DOB: 01/09/1976  Age: 48 y.o. MRN#: [id number] Attending Physician: Lewie Chamber, MD Primary Care Physician: Frederic Jericho, [name] Admit Date: 11/06/2023 Length of Stay: 8 days  Reason for Consultation/Follow-up: Non pain symptom management and Pain control  Subjective:   CC: Patient notes he had more pain with movement due to radiation yesterday though feeling okay today to continue IV Dilaudid PCA.  Palliative medicine team following up regarding symptom management.  Subjective:  Reviewed EMR prior to presenting to bedside.  At time of EMR review, patient has received IV Dilaudid via PCA with bolus dose of 0.75 mg x 25 doses.  Had increased patient's long-acting yesterday so now receiving MS Contin 90 mg every 8 hours.  Patient received first radiation therapy yesterday.  Continues to receive IV dexamethasone 4 mg every 6 hours.  Presented to bedside to see patient.  Patient laying comfortably in bed.  Patient's son present at bedside as well.  Discussed with patient pain management.  Patient noted he had worsening pain yesterday with movement due to radiation though feeling better at this time.  Discussed could work to transition patient from IV Dilaudid PCA to oral short acting medication.  Patient requests to continue IV Dilaudid PCA today to make sure his pain stays more stable after all the movement yesterday.  Acknowledged and can further discuss if ready to transition to oral as needed short acting medication tomorrow. Inquired about constipation management.  Patient notes he had regular bowel movement yesterday evening.  Discussed continuing on scheduled senna though can decrease if needed.  Patient denied having any concerns regarding bleeding and no blood in stool.  Noted continue to monitor with being on current medications.  Spent time answering questions as able.  Patient hopeful to have more  interactions with family coming to visit today.  Spent time providing emotional support via active listening.  Noted palliative medicine team to continue to follow with patient's medical journey.  Discussed care with hospitalist after seeing patient.  Objective:   Vital Signs:  BP 125/63 (BP Location: Right Arm)   Pulse 84   Temp 99 F (37.2 C) (Oral)   Resp 19   Ht 5\' 4"  (1.626 m)   Wt 56.2 kg   SpO2 100%   BMI 21.28 kg/m   Physical Exam: General: NAD, alert, cachectic, chronically ill-appearing Cardiovascular: RRR Respiratory: no increased work of breathing noted, not in respiratory distress Neuro: A&Ox4, following commands easily Psych: appropriately answers all questions  Imaging:  I personally reviewed recent imaging.   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  Recent imaging has shown progression of the disease and patient's bone.  Oncology consulted for recommendations.  Palliative medicine team consulted to assist with symptom management.  Recommendations/Plan: # Complex medical decision making/goals of care:  - Discussed care with patient as detailed above in HPI.  It has already been recommended that patient consider hospice support at home; agree with this recommendation due to patient's poor nutritional and functional status.  Patient having difficulty processing his medical illness and has stated that he does not currently want to pursue getting home with hospice support.  Patient going to receive palliative radiation to assist with pain management at this time.  Palliative medicine team to continue engaging as able.  -  Code Status: Limited: Do not attempt resuscitation (DNR) -DNR-LIMITED -Do Not Intubate/DNI   # Symptom management: Patient is receiving these palliative  interventions for symptom management with an intent to improve quality of life.   -Pain, severe in setting of metastatic colon cancer with mets to bone, liver, and lung Patient's as needed bolus doses from IV Dilaudid PCA decreased in past 24 hours as compared to previous days after increasing MS Contin further on 11/13/2023.  Also hoping that IV steroids and NSAID are assisting with edema management.   -Continue MS Contin to 90 mg every 8 hours.     -Continue PCA with IV Dilaudid 0.75 mg every 10 minutes bolus.  No continuous infusion as on long-acting oral opioids.   -Continue IV dexamethasone 4 mg every 6 hours due to edema noted on MRI   -Continue Celebrex 200 mg twice daily.  Patient receiving Protonix 40 mg daily.   -Consider use of bisphosphonate    -Agree with radiation course to assist with pain management as unfortunately opioids have difficulty managing bone related cancer pain without requiring adverse effects such as sedation to obtain pain management   -Mood/appetite   -Personally reviewed EKG which noted QTc on 11/09/2023 to be 432   -Continue olanzapine to 5 mg nightly   -Constipation   -Continue senna 1 tab twice daily  # Psychosocial Support:  -Brother, mother  # Discharge Planning: To Be Determined  Discussed with: Patient, patient's son, hospitalist  Thank you for allowing the palliative care team to participate in the care [name].  Alvester Morin, DO Palliative Care Provider PMT # [phone]  If patient remains symptomatic despite maximum doses, please call PMT at [phone] between 0700 and 1900. Outside of these hours, please call attending, as PMT does not have night coverage.

## 2023-11-14 NOTE — Progress Notes (Signed)
 Progress Note    Joseph Haney   [id number]  DOB: [birth year]  DOA: 11/06/2023     8 PCP: Frederic Jericho, [name]  Initial CC: pain, SOB  Hospital Course: Joseph Haney is a 48 yo male with PMH metastatic colon cancer (lung, liver, bones), worsening debility/weakness, GERD who initially presented with shortness of breath, weakness, and uncontrolled pain. He has had gradual decline since December 2024 spending majority of time in bed and having ongoing anorexia with weight loss. He was admitted for pain control.  Palliative care and oncology were consulted on admission as well.  Interval History:  No events overnight. Left forearm swelling has improved this morning since yesterday.  Underwent 1st radiation treatment yesterday afternoon.   Assessment and Plan: * Cancer associated pain - Palliative care consulted for assistance with pain management and GOC discussions - Currently on Dilaudid PCA with MS Contin being continuously adjusted  Stage IV carcinoma of colon (HCC) - follows with Dr. Mosetta Putt; was noted to have been too weak to follow up with oncology in the office as well recently  - with poor performance status, malnutrition, and tolerance, he has been recommended for transition to hospice but he is not yet ready - status has been updated to DNR at this time but main focus still remains pain control, but he's hopeful to walk more again; and still hopeful for "treatment again one day" - MRI T/L spine obtained 3/12 showing further metastatic lesions ("diffuse osseous disease in lumbar, lower thoracic, and posterior pelvis"; also noted with tumor extending into anterior epidural spaces along T12-L3 - evaluated by rad-onc on 3/13; suspect some of the lesion at T2-T4 may be amenable to radiation - decadron started 3/13 per palliative care as well  - CT sim done on 3/14 and radiation starting 3/14 with plan for 4 fractions (end on 3/19)  Left arm swelling - IV infiltrated on  3/14 - CT forearm showed diffuse edema - overall has improved today, 3/15 when seen; good pulse, no signs of compartment syndrome - okay to d/c neurovasc checks - continue elevation and heat pad as needed  Goals of care, counseling/discussion - palliative care following and continuing discussions as needed  Generalized weakness - Suspect progressive functional decline from underlying cancer but also due to patient being mostly bedridden at home -Given lower extremity weakness with Stage IV colon cancer, MRI T and L-spine ordered (see cancer)  Hypokalemia - replete as needed  Drug-induced constipation - continue senna  Elevated LFTs - related to metastasis   HTN (hypertension) - continue coreg  Protein-calorie malnutrition, severe (HCC) - Body mass index is 21.28 kg/m. - continued poor intake and weight loss   Old records reviewed in assessment of this patient  Antimicrobials:   DVT prophylaxis:  enoxaparin (LOVENOX) injection 40 mg Start: 11/07/23 1000   Code Status:   Code Status: Limited: Do not attempt resuscitation (DNR) -DNR-LIMITED -Do Not Intubate/DNI   Mobility Assessment (Last 72 Hours)     Mobility Assessment     Row Name 11/14/23 1000 11/13/23 1920 11/13/23 0849 11/12/23 1915 11/12/23 0701   Does patient have an order for bedrest or is patient medically unstable No - Continue assessment No - Continue assessment No - Continue assessment No - Continue assessment No - Continue assessment   What is the highest level of mobility based on the progressive mobility assessment? Level 2 (Chairfast) - Balance while sitting on edge of bed and cannot stand Level 2 (Chairfast) - Balance while  sitting on edge of bed and cannot stand Level 2 (Chairfast) - Balance while sitting on edge of bed and cannot stand Level 2 (Chairfast) - Balance while sitting on edge of bed and cannot stand Level 2 (Chairfast) - Balance while sitting on edge of bed and cannot stand   Is the above  level different from baseline mobility prior to current illness? Yes - Recommend PT order Yes - Recommend PT order Yes - Recommend PT order Yes - Recommend PT order Yes - Recommend PT order    Row Name 11/11/23 1925           Does patient have an order for bedrest or is patient medically unstable No - Continue assessment       What is the highest level of mobility based on the progressive mobility assessment? Level 2 (Chairfast) - Balance while sitting on edge of bed and cannot stand       Is the above level different from baseline mobility prior to current illness? Yes - Recommend PT order                Barriers to discharge: none Disposition Plan:  Home  HH orders placed:  Status is: Inpt  Objective: Blood pressure 120/66, pulse 86, temperature 97.9 F (36.6 C), resp. rate 16, height 5\' 4"  (1.626 m), weight 56.2 kg, SpO2 98%.  Examination:  Physical Exam Constitutional:      General: He is not in acute distress.    Comments: Thin and in no distress  HENT:     Head: Normocephalic and atraumatic.     Mouth/Throat:     Mouth: Mucous membranes are moist.  Eyes:     Extraocular Movements: Extraocular movements intact.  Cardiovascular:     Rate and Rhythm: Normal rate and regular rhythm.  Pulmonary:     Effort: Pulmonary effort is normal. No respiratory distress.     Breath sounds: Normal breath sounds. No wheezing.  Abdominal:     General: Bowel sounds are normal. There is no distension.     Palpations: Abdomen is soft.     Tenderness: There is no abdominal tenderness.  Musculoskeletal:        General: Normal range of motion.     Cervical back: Normal range of motion and neck supple.     Comments: Left forearm mildly swollen and now improved; no further TTP. Normal radial pulse  Skin:    General: Skin is warm and dry.  Neurological:     Mental Status: He is alert.     Motor: Weakness (1-2/5 LE strength) present.  Psychiatric:        Attention and Perception:  Attention normal.        Mood and Affect: Mood is depressed.        Speech: Speech normal.        Behavior: Behavior normal.        Thought Content: Thought content normal.      Consultants:  Oncology Palliative care  Radiation oncology   Procedures:    Data Reviewed: No results found for this or any previous visit (from the past 24 hours).  I have reviewed pertinent nursing notes, vitals, labs, and images as necessary. I have ordered labwork to follow up on as indicated.  I have reviewed the last notes from staff over past 24 hours. I have discussed patient's care plan and test results with nursing staff, CM/SW, and other staff as appropriate.  Time spent: Greater than  50% of the 55 minute visit was spent in counseling/coordination of care for the patient as laid out in the A&P.   LOS: 8 days   Lewie Chamber, MD [facility] 11/14/2023, 1:34 PM

## 2023-11-14 NOTE — Assessment & Plan Note (Signed)
-   IV infiltrated on 3/14 - CT forearm showed diffuse edema - overall has improved today, 3/15 when seen; good pulse, no signs of compartment syndrome - okay to d/c neurovasc checks - continue elevation and heat pad as needed - resolved as of 3/16

## 2023-11-15 ENCOUNTER — Other Ambulatory Visit: Payer: Self-pay

## 2023-11-15 DIAGNOSIS — C189 Malignant neoplasm of colon, unspecified: Secondary | ICD-10-CM | POA: Diagnosis not present

## 2023-11-15 DIAGNOSIS — R531 Weakness: Secondary | ICD-10-CM | POA: Diagnosis not present

## 2023-11-15 DIAGNOSIS — G893 Neoplasm related pain (acute) (chronic): Secondary | ICD-10-CM | POA: Diagnosis not present

## 2023-11-15 DIAGNOSIS — Z515 Encounter for palliative care: Secondary | ICD-10-CM | POA: Diagnosis not present

## 2023-11-15 DIAGNOSIS — Z7189 Other specified counseling: Secondary | ICD-10-CM | POA: Diagnosis not present

## 2023-11-15 DIAGNOSIS — R630 Anorexia: Secondary | ICD-10-CM | POA: Diagnosis not present

## 2023-11-15 NOTE — Progress Notes (Signed)
 Daily Progress Note   Patient Name: Joseph Haney       Date: 11/15/2023 DOB: Dec 09, 1975  Age: 48 y.o. MRN#: [id number] Attending Physician: Lewie Chamber, MD Primary Care Physician: Frederic Jericho, [name] Admit Date: 11/06/2023 Length of Stay: 9 days  Reason for Consultation/Follow-up: Non pain symptom management and Pain control  Subjective:   CC: Patient again wanting to discuss planning moving forward.  Palliative medicine team following up regarding symptom management.  Subjective:  Reviewed EMR prior to presenting to bedside.  At time of EMR review in past 24 hours patient has required as needed IV Dilaudid bolus 0.75 mg x 40 doses.  Patient continuing to receive IV dexamethasone 4 mg every 6 hours.  Patient receiving Celebrex so stop date placed for 10 doses total.  Presented to bedside to see patient.  No family present at bedside.  Spent time discussing patient's pain management at this time.  Patient inquiring about planning moving forward.  Again spent time reviewing patient's overall medical illness.  Spent time explaining stage IV colon cancer and what this ultimately means.  Again spent time reviewing how patient has not currently a candidate for chemotherapy due to his poor functional status and malnutrition.  Again reviewed that current interventions are aimed at symptom management for the patient reviewed radiation as a tool for symptom management to help alleviate patient's pain.  Discussed how radiation is not overall changing the outcome of his stage IV colon cancer.  Noted that radiation is scheduled to go through Wednesday.  Oncology has already recommended that patient return home when able with hospice support.  Spent time again describing generalities regarding hospice care.  Discussed that if patient were to go home with hospice, could be provided PCA since hospice can manage this.  Again goal with hospice would be to focus on comfort at the end of life.  Patient  wanting to discuss his overall prognosis.  Spent time describing prognosis in likely terms of short months.  Discussed patient would be appropriate for hospice care since one of the criteria for hospice is prognosis of 6 months or less.  Spent time providing emotional support during this discussion as patient appropriately tearful.  Empathized with difficult situation. Encouraged discussions with his family about recommendation to go home with hospice.  As per prior oncology notes, this has already been discussed with the family.  Patient just has not been agreeing to hospice.  Patient noted his mother would be at bedside tomorrow so noted could follow-up in a.m. to hopefully discussed with her as well.   When discussing pain management for today, we will maintain on PCA as patient considering options about possible hospice support and will be continuing to receive radiation at this time.  Discussed if patient opts not to go home with hospice, need to get on oral regimen. Patient inquiring about spiritual support so noted would ask RN to page chaplain for visit. Spent time answering questions as able.  Noted palliative medicine team will continue to follow along with patient's medical journey.  Discussed care with RN and hospitalist after visit. Objective:   Vital Signs:  BP 105/83 (BP Location: Right Arm)   Pulse 87   Temp 98 F (36.7 C)   Resp 18   Ht 5\' 4"  (1.626 m)   Wt 56.2 kg   SpO2 99%   BMI 21.28 kg/m   Physical Exam: General: NAD, alert, cachectic, chronically ill-appearing Cardiovascular: RRR Respiratory: no increased work of breathing noted, not  in respiratory distress Neuro: A&Ox4, following commands easily Psych: appropriately answers all questions  Imaging:  I personally reviewed recent imaging.   Assessment & Plan:   Assessment: Patient is a 48 year old male with a past medical history of metastatic colon cancer with metastatic disease to lungs, bone, and liver who  was admitted on 11/06/2023 for uncontrolled cancer associated pain.  Recent imaging has shown progression of the disease and patient's bone.  Oncology consulted for recommendations.  Palliative medicine team consulted to assist with symptom management.  Recommendations/Plan: # Complex medical decision making/goals of care:  - Discussed care with patient as detailed above in HPI.  Spent lengthy amount of time again reviewing patient's medical status, patient is not currently a candidate for cancer directed therapies due to his poor functional status and malnutrition, and has been recommended for patient to return home with hospice support.  Patient having difficulty processing all of this information.  Patient still wanting to focus on hope that he will "get better".  Spent time providing emotional support and discussing how better time could be time spent at home with those he loves.  Palliative medicine team continuing to follow along to engage in conversations as able.  -  Code Status: Limited: Do not attempt resuscitation (DNR) -DNR-LIMITED -Do Not Intubate/DNI   # Symptom management: Patient is receiving these palliative interventions for symptom management with an intent to improve quality of life.   -Pain, severe in setting of metastatic colon cancer with mets to bone, liver, and lung Patient's as needed bolus doses from IV Dilaudid PCA decreased in past 24 hours as compared to previous days after increasing MS Contin further on 11/13/2023.  Also hoping that IV steroids and NSAID are assisting with edema management.   -Continue MS Contin to 90 mg every 8 hours.     -Continue PCA with IV Dilaudid 0.75 mg every 10 minutes bolus.  No continuous infusion as on long-acting oral opioids.   -Continue IV dexamethasone 4 mg every 6 hours due to edema noted on MRI   -Continue Celebrex 200 mg twice daily for total of 10 doses.   Patient receiving Protonix 40 mg daily.   -Consider use of  bisphosphonate    -Agree with radiation course to assist with pain management as unfortunately opioids have difficulty managing bone related cancer pain without requiring adverse effects such as sedation to obtain pain management   -Mood/appetite   -Personally reviewed EKG which noted QTc on 11/09/2023 to be 432   -Continue olanzapine to 5 mg nightly   -Constipation   -Continue senna 1 tab twice daily  # Psychosocial Support:  -Brother, mother  -Asked RN to page chaplain for support  # Discharge Planning: To Be Determined  Discussed with: Patient, hospitalist, RN  Thank you for allowing the palliative care team to participate in the care [name].  Alvester Morin, DO Palliative Care Provider PMT # [phone]  If patient remains symptomatic despite maximum doses, please call PMT at [phone] between 0700 and 1900. Outside of these hours, please call attending, as PMT does not have night coverage.  Personally spent 55 minutes in patient care including extensive chart review (labs, imaging, progress/consult notes, vital signs), medically appropraite exam, discussed with treatment team, education to patient, family, and staff, documenting clinical information, medication review and management, coordination of care, and available advanced directive documents.

## 2023-11-15 NOTE — Progress Notes (Signed)
 Progress Note    Joseph Haney   [id number]  DOB: 10-13-1975  DOA: 11/06/2023     9 PCP: Frederic Jericho, [name]  Initial CC: pain, SOB  Hospital Course: Joseph Haney is a 48 yo male with PMH metastatic colon cancer (lung, liver, bones), worsening debility/weakness, GERD who initially presented with shortness of breath, weakness, and uncontrolled pain. He has had gradual decline since December 2024 spending majority of time in bed and having ongoing anorexia with weight loss. He was admitted for pain control.  Palliative care and oncology were consulted on admission as well.  Interval History:  No events overnight. Left forearm swelling now resolved.  Pain control seems equivocal.  Cannot move LE at all for me, not even wiggling toes (unchanged).    Assessment and Plan: * Cancer associated pain - Palliative care consulted for assistance with pain management and GOC discussions - Currently on Dilaudid PCA with MS Contin being continuously adjusted  Stage IV carcinoma of colon (HCC) - follows with Dr. Mosetta Putt; was noted to have been too weak to follow up with oncology in the office as well recently  - with poor performance status, malnutrition, and tolerance, he has been recommended for transition to hospice but he is not yet ready - status has been updated to DNR at this time but main focus still remains pain control, but he's hopeful to walk more again; and still hopeful for "treatment again one day" - MRI T/L spine obtained 3/12 showing further metastatic lesions ("diffuse osseous disease in lumbar, lower thoracic, and posterior pelvis"; also noted with tumor extending into anterior epidural spaces along T12-L3 - evaluated by rad-onc on 3/13; suspect some of the lesion at T2-T4 may be amenable to radiation - decadron started 3/13 per palliative care as well  - CT sim done on 3/14 and radiation starting 3/14 with plan for 4 fractions (end on 3/19)  Goals of care,  counseling/discussion - palliative care following and continuing discussions as needed  Generalized weakness - Suspect progressive functional decline from underlying cancer but also due to patient being mostly bedridden at home -Given lower extremity weakness with Stage IV colon cancer, MRI T and L-spine ordered (see cancer)  Left arm swelling-resolved as of 11/15/2023 - IV infiltrated on 3/14 - CT forearm showed diffuse edema - overall has improved today, 3/15 when seen; good pulse, no signs of compartment syndrome - okay to d/c neurovasc checks - continue elevation and heat pad as needed - resolved as of 3/16  Hypokalemia - replete as needed  Drug-induced constipation - continue senna  Elevated LFTs - related to metastasis   HTN (hypertension) - continue coreg  Protein-calorie malnutrition, severe (HCC) - Body mass index is 21.28 kg/m. - continued poor intake and weight loss   Old records reviewed in assessment of this patient  Antimicrobials:   DVT prophylaxis:  enoxaparin (LOVENOX) injection 40 mg Start: 11/07/23 1000   Code Status:   Code Status: Limited: Do not attempt resuscitation (DNR) -DNR-LIMITED -Do Not Intubate/DNI   Mobility Assessment (Last 72 Hours)     Mobility Assessment     Row Name 11/15/23 0805 11/14/23 2030 11/14/23 1000 11/13/23 1920 11/13/23 0849   Does patient have an order for bedrest or is patient medically unstable No - Continue assessment Yes- Bedfast (Level 1) - Complete No - Continue assessment No - Continue assessment No - Continue assessment   What is the highest level of mobility based on the progressive mobility assessment? Level 2 (  Chairfast) - Balance while sitting on edge of bed and cannot stand Level 2 (Chairfast) - Balance while sitting on edge of bed and cannot stand Level 2 (Chairfast) - Balance while sitting on edge of bed and cannot stand Level 2 (Chairfast) - Balance while sitting on edge of bed and cannot stand Level 2  (Chairfast) - Balance while sitting on edge of bed and cannot stand   Is the above level different from baseline mobility prior to current illness? Yes - Recommend PT order Yes - Recommend PT order Yes - Recommend PT order Yes - Recommend PT order Yes - Recommend PT order    Row Name 11/12/23 1915           Does patient have an order for bedrest or is patient medically unstable No - Continue assessment       What is the highest level of mobility based on the progressive mobility assessment? Level 2 (Chairfast) - Balance while sitting on edge of bed and cannot stand       Is the above level different from baseline mobility prior to current illness? Yes - Recommend PT order                Barriers to discharge: none Disposition Plan:  Home  HH orders placed:  Status is: Inpt  Objective: Blood pressure 113/67, pulse 97, temperature 98 F (36.7 C), resp. rate 16, height 5\' 4"  (1.626 m), weight 56.2 kg, SpO2 100%.  Examination:  Physical Exam Constitutional:      General: He is not in acute distress.    Comments: Thin and in no distress  HENT:     Head: Normocephalic and atraumatic.     Mouth/Throat:     Mouth: Mucous membranes are moist.  Eyes:     Extraocular Movements: Extraocular movements intact.  Cardiovascular:     Rate and Rhythm: Normal rate and regular rhythm.  Pulmonary:     Effort: Pulmonary effort is normal. No respiratory distress.     Breath sounds: Normal breath sounds. No wheezing.  Abdominal:     General: Bowel sounds are normal. There is no distension.     Palpations: Abdomen is soft.     Tenderness: There is no abdominal tenderness.  Musculoskeletal:        General: Normal range of motion.     Cervical back: Normal range of motion and neck supple.     Comments: Left forearm mildly swollen and now improved; no further TTP. Normal radial pulse  Skin:    General: Skin is warm and dry.  Neurological:     Mental Status: He is alert.     Motor: Weakness  (Unsure if effort related but no muscle tone in LE (0/5) , maybe 1/5 in LLE) present.  Psychiatric:        Attention and Perception: Attention normal.        Mood and Affect: Mood is depressed.        Speech: Speech normal.        Behavior: Behavior normal.        Thought Content: Thought content normal.      Consultants:  Oncology Palliative care  Radiation oncology   Procedures:    Data Reviewed: No results found for this or any previous visit (from the past 24 hours).  I have reviewed pertinent nursing notes, vitals, labs, and images as necessary. I have ordered labwork to follow up on as indicated.  I have reviewed the  last notes from staff over past 24 hours. I have discussed patient's care plan and test results with nursing staff, CM/SW, and other staff as appropriate.  Time spent: Greater than 50% of the 55 minute visit was spent in counseling/coordination of care for the patient as laid out in the A&P.   LOS: 9 days   Lewie Chamber, MD [facility] 11/15/2023, 12:03 PM

## 2023-11-15 NOTE — Consult Note (Signed)
 WOC Nurse Consult Note: Reason for Consult: sacral pressure injury; requested re-evaluation after last week's assessment  End-stage cancer diagnosis Wound type: Unstageable Pressure Injury Pressure Injury POA: No Measurement: see nursing flow sheets Wound bed:100% necrotic tissue Drainage (amount, consistency, odor) see nursing flow sheets Periwound:intact  Dressing procedure/placement/frequency: Low air loss mattress ordered previously Chair pad for pressure redistribution declined Discussed with MD/palliative and nursing no aggressive debridement beneficial at this time. Air mattress not in place, patient may have refused.  Apply betadine to wound bed, top with foam.  Discussed POC with patient and bedside nurse.  Re consult if needed, will not follow at this time. Thanks  Jodeci Roarty [employer], RN,CWOCN, CNS, CWON-AP [phone])

## 2023-11-15 NOTE — Progress Notes (Signed)
 Offered air mattress for pt, explained benefits of air mattress, pt states he will let me know if he decides he wants it

## 2023-11-15 NOTE — Plan of Care (Signed)
  Problem: Education: Goal: Knowledge of General Education information will improve Description: Including pain rating scale, medication(s)/side effects and non-pharmacologic comfort measures 11/15/2023 0632 by Donia Pounds, RN Outcome: Progressing 11/15/2023 4098 by Donia Pounds, RN Outcome: Progressing   Problem: Health Behavior/Discharge Planning: Goal: Ability to manage health-related needs will improve 11/15/2023 1191 by Donia Pounds, RN Outcome: Progressing 11/15/2023 4782 by Donia Pounds, RN Outcome: Progressing   Problem: Clinical Measurements: Goal: Ability to maintain clinical measurements within normal limits will improve 11/15/2023 9562 by Donia Pounds, RN Outcome: Progressing 11/15/2023 1308 by Donia Pounds, RN Outcome: Progressing Goal: Will remain free from infection 11/15/2023 6578 by Donia Pounds, RN Outcome: Progressing 11/15/2023 4696 by Donia Pounds, RN Outcome: Progressing Goal: Diagnostic test results will improve 11/15/2023 2952 by Donia Pounds, RN Outcome: Progressing 11/15/2023 8413 by Donia Pounds, RN Outcome: Progressing Goal: Respiratory complications will improve 11/15/2023 2440 by Donia Pounds, RN Outcome: Progressing 11/15/2023 1027 by Donia Pounds, RN Outcome: Progressing Goal: Cardiovascular complication will be avoided 11/15/2023 2536 by Donia Pounds, RN Outcome: Progressing 11/15/2023 6440 by Donia Pounds, RN Outcome: Progressing   Problem: Coping: Goal: Level of anxiety will decrease 11/15/2023 3474 by Donia Pounds, RN Outcome: Progressing 11/15/2023 2595 by Donia Pounds, RN Outcome: Progressing   Problem: Safety: Goal: Ability to remain free from injury will improve 11/15/2023 6387 by Donia Pounds, RN Outcome: Progressing 11/15/2023 5643 by Donia Pounds, RN Outcome: Progressing   Problem: Skin Integrity: Goal: Risk for impaired skin integrity will decrease 11/15/2023 3295 by Donia Pounds, RN Outcome: Progressing 11/15/2023 1884 by Donia Pounds, RN Outcome: Progressing

## 2023-11-15 NOTE — Plan of Care (Signed)

## 2023-11-16 ENCOUNTER — Other Ambulatory Visit: Payer: Self-pay

## 2023-11-16 ENCOUNTER — Ambulatory Visit
Admit: 2023-11-16 | Discharge: 2023-11-16 | Disposition: A | Discharge status: Home / Self Care | Attending: Radiation Oncology

## 2023-11-16 DIAGNOSIS — Z7189 Other specified counseling: Secondary | ICD-10-CM | POA: Diagnosis not present

## 2023-11-16 DIAGNOSIS — Z79899 Other long term (current) drug therapy: Secondary | ICD-10-CM | POA: Diagnosis not present

## 2023-11-16 DIAGNOSIS — C189 Malignant neoplasm of colon, unspecified: Secondary | ICD-10-CM | POA: Diagnosis not present

## 2023-11-16 DIAGNOSIS — R4589 Other symptoms and signs involving emotional state: Secondary | ICD-10-CM | POA: Diagnosis not present

## 2023-11-16 DIAGNOSIS — Z515 Encounter for palliative care: Secondary | ICD-10-CM | POA: Diagnosis not present

## 2023-11-16 DIAGNOSIS — G893 Neoplasm related pain (acute) (chronic): Secondary | ICD-10-CM | POA: Diagnosis not present

## 2023-11-16 LAB — RAD ONC ARIA SESSION SUMMARY
Course Elapsed Days: 3
Plan Fractions Treated to Date: 2
Plan Prescribed Dose Per Fraction: 5 Gy
Plan Total Fractions Prescribed: 4
Plan Total Prescribed Dose: 20 Gy
Reference Point Dosage Given to Date: 10 Gy
Reference Point Session Dosage Given: 5 Gy
Session Number: 2

## 2023-11-16 LAB — CBC WITH DIFFERENTIAL/PLATELET
Abs Immature Granulocytes: 1.1 10*3/uL — ABNORMAL HIGH (ref 0.00–0.07)
Band Neutrophils: 4 %
Basophils Absolute: 0 10*3/uL (ref 0.0–0.1)
Basophils Relative: 0 %
Eosinophils Absolute: 0 10*3/uL (ref 0.0–0.5)
Eosinophils Relative: 0 %
HCT: 22.8 % — ABNORMAL LOW (ref 39.0–52.0)
Hemoglobin: 6.8 g/dL — CL (ref 13.0–17.0)
Lymphocytes Relative: 14 %
Lymphs Abs: 1.8 10*3/uL (ref 0.7–4.0)
MCH: 26.4 pg (ref 26.0–34.0)
MCHC: 29.8 g/dL — ABNORMAL LOW (ref 30.0–36.0)
MCV: 88.4 fL (ref 80.0–100.0)
Metamyelocytes Relative: 2 %
Monocytes Absolute: 0.1 10*3/uL (ref 0.1–1.0)
Monocytes Relative: 1 %
Myelocytes: 7 %
Neutro Abs: 9.6 10*3/uL — ABNORMAL HIGH (ref 1.7–7.7)
Neutrophils Relative %: 72 %
Platelets: 230 10*3/uL (ref 150–400)
RBC: 2.58 MIL/uL — ABNORMAL LOW (ref 4.22–5.81)
RDW: 18.7 % — ABNORMAL HIGH (ref 11.5–15.5)
WBC: 12.6 10*3/uL — ABNORMAL HIGH (ref 4.0–10.5)
nRBC: 3.9 % — ABNORMAL HIGH (ref 0.0–0.2)

## 2023-11-16 LAB — COMPREHENSIVE METABOLIC PANEL
ALT: 44 U/L (ref 0–44)
AST: 86 U/L — ABNORMAL HIGH (ref 15–41)
Albumin: 1.8 g/dL — ABNORMAL LOW (ref 3.5–5.0)
Alkaline Phosphatase: 1034 U/L — ABNORMAL HIGH (ref 38–126)
Anion gap: 10 (ref 5–15)
BUN: 38 mg/dL — ABNORMAL HIGH (ref 6–20)
CO2: 24 mmol/L (ref 22–32)
Calcium: 8.6 mg/dL — ABNORMAL LOW (ref 8.9–10.3)
Chloride: 104 mmol/L (ref 98–111)
Creatinine, Ser: 0.73 mg/dL (ref 0.61–1.24)
GFR, Estimated: >60 mL/min (ref >60)
Glucose, Bld: 127 mg/dL — ABNORMAL HIGH (ref 70–99)
Potassium: 4.8 mmol/L (ref 3.5–5.1)
Sodium: 138 mmol/L (ref 135–145)
Total Bilirubin: 0.5 mg/dL (ref 0.0–1.2)
Total Protein: 6.4 g/dL — ABNORMAL LOW (ref 6.5–8.1)

## 2023-11-16 LAB — MAGNESIUM: Magnesium: 2.1 mg/dL (ref 1.7–2.4)

## 2023-11-16 LAB — HEMOGLOBIN AND HEMATOCRIT, BLOOD
HCT: 28.3 % — ABNORMAL LOW (ref 39.0–52.0)
Hemoglobin: 8.8 g/dL — ABNORMAL LOW (ref 13.0–17.0)

## 2023-11-16 LAB — PREPARE RBC (CROSSMATCH)

## 2023-11-16 MED ORDER — SODIUM CHLORIDE 0.9% IV SOLUTION: Freq: Once | INTRAVENOUS | Status: AC

## 2023-11-16 MED ORDER — DEXAMETHASONE SODIUM PHOSPHATE 4 MG/ML IJ SOLN
4.0000 mg | Freq: Three times a day (TID) | INTRAMUSCULAR | Status: DC
Start: 2023-11-16 — End: 2023-11-23
  Administered 2023-11-16 – 2023-11-23 (×21): 4 mg via INTRAVENOUS
  Filled 2023-11-16 (×22): qty 1

## 2023-11-16 MED ORDER — ALUM & MAG HYDROXIDE-SIMETH 200-200-20 MG/5ML PO SUSP
15.0000 mL | ORAL | Status: DC | PRN
  Administered 2023-11-16 – 2023-12-01 (×8): 15 mL via ORAL
  Filled 2023-11-16 (×8): qty 30

## 2023-11-16 NOTE — Progress Notes (Signed)
Pt transferred to air mattress bed

## 2023-11-16 NOTE — Progress Notes (Signed)
 Daily Progress Note   Patient Name: Joseph Haney       Date: 11/16/2023 DOB: 09/14/1975  Age: 48 y.o. MRN#: [id number] Attending Physician: Lewie Chamber, MD Primary Care Physician: Frederic Jericho, [name] Admit Date: 11/06/2023 Length of Stay: 10 days  Reason for Consultation/Follow-up: Establishing goals of care, Non pain symptom management, and Pain control  Subjective:   CC: Following up today regarding continued discussions about planning.  Palliative medicine team following up regarding symptom management.  Subjective:  Reviewed EMR prior to presenting to bedside.  At time of EMR review in past 24 hours patient has received IV Dilaudid via PCA bolus at 0.75 mg x 23 doses.  Patient's CMP reviewed noting creatinine of 0.73 and albumin of 1.8.  Patient's CBC reviewed which showed hemoglobin of 6.8 and patient received blood transfusion with improvement of hemoglobin to 8.8.  Wound care nurse has evaluated patient who was noted to have unstageable sacral wound.  Presented to bedside to see patient this morning.  Had plan to speak with patient's mother at bedside at his request between 10 and 11 AM.  When presenting to bedside, patient notes that because his radiation time was changed to 3 PM today, he told his mother not to come.  Patient's son is currently at bedside though was only involved in short percentage of conversation as he stepped out of the room and did not return while this provider was present. Again spent extensive time with the patient reviewing his medical illness.  At his request, discussed prognosis and continued progression of his stage IV cancer.  Again spent time reviewing that current radiation is to help with pain management, which patient feels his pain is slightly better today.  Noted radiation would be finished on Wednesday and then need to determine next supportive step for patient.  Again discussed multiple providers including palliative medicine team have  recommended patient return home with hospice support.  Provided generalities about what home hospice would and would not provide.  Discussed patient could continue to receive pain management via PCA when going home with hospice.  Patient today noting he is willing to speak with a hospice liaison to get more information.  Noted importance of having his family present for this so everyone could hear the same information. We again reviewed that based on patient's functional status, he is not a candidate for cancer directed therapies.  Physical therapy was consulted and evaluated patient at his request.  Patient still not having progression of functional status.  Again discussed how patient has to be certain functional status so that giving cancer directed medications does not harm him more than help him. I again tried to explore what patient is hoping for moving forward.  Patient continues to state he wants to "get better".  When exploring more about this patient notes he wants to be able to return home and walk again.  Discussed how patient has not been able to walk here in the hospital and do not expect his functional status to overall improve in light of his continued cancer progression and poor nutrition.  Have been trying to optimize patient's nutrition as able.  Patient voiced frustration about not hearing this though we have continued to discuss about it daily as per his request. Noted in terms of pain management today, will continue on PCA with patient getting radiation to allow for acute pain management.  Would encourage patient and family to discuss with home hospice possible support 1 could receive to  continue PCA in the outpatient setting.  Again reviewed that hospice is focused on comfort at the end of life and would not be providing aggressive medical interventions like lab work, IV fluids, or cancer directed therapies.  Again patient stated he is willing to speak with the hospice representative for  the inpatient hospice here in town, [place].  Even spent time explaining inpatient versus home with hospice at this to patient and the generalities around this as he seemed confused that he can just go to inpatient hospice and get "better" there.  Spent time answering questions as able.  Noted palliative medicine team continue to follow along and engage in conversations as able.  Discussed care with IDT after visit to coordinate care including hospitalist, RN, chaplain, oncology, radiation oncologist, and [facility] liaison. Objective:   Vital Signs:  BP 130/81 (BP Location: Left Arm)   Pulse 86   Temp 98.2 F (36.8 C) (Oral)   Resp 16   Ht 5\' 4"  (1.626 m)   Wt 56.2 kg   SpO2 100%   BMI 21.28 kg/m   Physical Exam: General: NAD, alert, cachectic, chronically ill-appearing Cardiovascular: RRR Respiratory: no increased work of breathing noted, not in respiratory distress Neuro: A&Ox4, following commands easily Psych: appropriately answers all questions  Imaging:  I personally reviewed recent imaging.   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  Recent imaging has shown progression of the disease and patient's bone.  Oncology consulted for recommendations.  Palliative medicine team consulted to assist with symptom management.  Recommendations/Plan: # Complex medical decision making/goals of care:  - Discussed care with patient as detailed above in HPI.  Have continued to have multiple lengthy conversations about patient's medical status, prognosis, not being a candidate for further cancer directed therapy based on his poor functional and nutritional status, and discussed possible support moving forward through home hospice.  Patient continues to struggle with processing this information and  continues to focus on getting "better".  Palliative medicine team continuing to follow along to engage in conversations as able.  -  Code Status: Limited: Do not attempt resuscitation (DNR) -DNR-LIMITED -Do Not Intubate/DNI   # Symptom management: Patient is receiving these palliative interventions for symptom management with an intent to improve quality of life.   -Pain, severe in setting of metastatic colon cancer with mets to bone, liver, and lung Patient's as needed bolus doses from IV Dilaudid PCA decreased in past 24 hours as compared to previous day after increasing MS Contin further on 11/13/2023.  Also hoping that IV steroids and NSAID are assisting with edema management.   -Continue MS Contin to 90 mg every 8 hours.     -Continue PCA with IV Dilaudid 0.75 mg every 10 minutes bolus.  No continuous infusion as on long-acting oral opioids.   -Decrease IV dexamethasone 4 mg 8 hours.  Continue to titrate medication down as able.   -Continue Celebrex 200 mg twice daily for total of 10 doses.   Patient receiving Protonix 40 mg daily.   -Consider use of bisphosphonate    -Agree with radiation course to assist with pain management as unfortunately opioids have difficulty managing bone related cancer pain without requiring adverse effects such as sedation to obtain pain management   -Mood/appetite   -Personally reviewed EKG which noted QTc on  11/09/2023 to be 432   -Continue olanzapine to 5 mg nightly   -Constipation   -Continue senna 1 tab twice daily  # Psychosocial Support:  -Brother, mother, son  -Chaplain following for support  # Discharge Planning: To Be Determined  Discussed with: Patient, patient's son, hospitalist, RN, radiation oncology, oncology, [facility] liaison  Thank you for allowing the palliative care team to participate in the care [name].  Alvester Morin, DO Palliative Care Provider PMT # [phone]  If patient remains symptomatic despite maximum doses, please  call PMT at [phone] between 0700 and 1900. Outside of these hours, please call attending, as PMT does not have night coverage.  Personally spent 65 minutes in patient care including extensive chart review (labs, imaging, progress/consult notes, vital signs), medically appropraite exam, discussed with treatment team, education to patient, family, and staff, documenting clinical information, medication review and management, coordination of care, and available advanced directive documents.

## 2023-11-16 NOTE — Progress Notes (Signed)
 Joseph Haney   DOB:06-06-1976   QI#:[id number]   LOV#:[id number]  Medical oncology follow-up note  Subjective: Patient started palliative radiation today, he has good appetite and eats well.  However he has not been out of bed for walk. He is still on PCA. His son is at bedside.    Objective:  Vitals:   11/16/23 1250 11/16/23 1705  BP: 130/73   Pulse: 95   Resp: 17 18  Temp: 98 F (36.7 C)   SpO2: 100% 99%    Body mass index is 21.28 kg/m.  Intake/Output Summary (Last 24 hours) at 11/16/2023 1745 Last data filed at 11/16/2023 1336 Gross per 24 hour  Intake 276 ml  Output 600 ml  Net -324 ml     Sclerae unicteric  Oropharynx clear  MSK no focal spinal tenderness, no peripheral edema  Neuro nonfocal   CBG (last 3)  No results for input(s): "GLUCAP" in the last 72 hours.    Labs:  Lab Results  Component Value Date   WBC 12.6 (H) 11/16/2023   HGB 8.8 (L) 11/16/2023   HCT 28.3 (L) 11/16/2023   MCV 88.4 11/16/2023   PLT 230 11/16/2023   NEUTROABS 9.6 (H) 11/16/2023      Urine Studies No results for input(s): "UHGB", "CRYS" in the last 72 hours.  Invalid input(s): "UACOL", "UAPR", "USPG", "UPH", "UTP", "UGL", "UKET", "UBIL", "UNIT", "UROB", "ULEU", "UEPI", "UWBC", "URBC", "UBAC", "CAST", "UCOM", "BILUA"  Basic Metabolic Panel: Recent Labs  Lab 11/10/23 0427 11/16/23 0229  NA 133* 138  K 4.6 4.8  CL 99 104  CO2 22 24  GLUCOSE 114* 127*  BUN 20 38*  CREATININE 0.79 0.73  CALCIUM 8.4* 8.6*  MG 2.0 2.1   GFR Estimated Creatinine Clearance: 88.8 mL/min (by C-G formula based on SCr of 0.73 mg/dL). Liver Function Tests: Recent Labs  Lab 11/10/23 0427 11/16/23 0229  AST 121* 86*  ALT 58* 44  ALKPHOS 1,285* 1,034*  BILITOT 0.8 0.5  PROT 7.3 6.4*  ALBUMIN 2.1* 1.8*   No results for input(s): "LIPASE", "AMYLASE" in the last 168 hours. No results for input(s): "AMMONIA" in the last 168 hours. Coagulation profile No results for input(s): "INR",  "PROTIME" in the last 168 hours.  CBC: Recent Labs  Lab 11/10/23 0427 11/16/23 0229 11/16/23 0859  WBC 14.4* 12.6*  --   NEUTROABS 9.7* 9.6*  --   HGB 8.0* 6.8* 8.8*  HCT 27.0* 22.8* 28.3*  MCV 88.5 88.4  --   PLT 238 230  --    Cardiac Enzymes: No results for input(s): "CKTOTAL", "CKMB", "CKMBINDEX", "TROPONINI" in the last 168 hours. BNP: Invalid input(s): "POCBNP" CBG: No results for input(s): "GLUCAP" in the last 168 hours.  D-Dimer No results for input(s): "DDIMER" in the last 72 hours. Hgb A1c No results for input(s): "HGBA1C" in the last 72 hours. Lipid Profile No results for input(s): "CHOL", "HDL", "LDLCALC", "TRIG", "CHOLHDL", "LDLDIRECT" in the last 72 hours. Thyroid function studies No results for input(s): "TSH", "T4TOTAL", "T3FREE", "THYROIDAB" in the last 72 hours.  Invalid input(s): "FREET3" Anemia work up No results for input(s): "VITAMINB12", "FOLATE", "FERRITIN", "TIBC", "IRON", "RETICCTPCT" in the last 72 hours. Microbiology No results found for this or any previous visit (from the past 240 hours).     Studies:  No results found.   Assessment: 48 y.o. male   Metastatic colon cancer Cancer related pain, anorexia, and weakness Anemia and leukocytosis Hypertension Transaminitis Severe protein and calorie malnutrition Depression Sacral pressure ulcer  Plan:  -Due to his very poor performance status, especially mobility limitation, he is not a candidate for chemotherapy.  I made it clear with patient and his son today. -I had a long conversation with patient and his son about the his goal of care, which is palliative with comfort as priority.  Patient and his son are in agreement, however both of them are fixed on the idea of " give me more time and I will get better".  Patient was able to get out of bed and use the bathroom at home before he came, however his mobility has further decreased in the hospital. Pt and his son acknowledged that.   -then I discussed the transition of hospital and home care, I am afraid that patient will come back to hospital right after discharge without appropriate support at home, especially medical care, patient and his son also voices the same concern.  I explained to them that home hospice is the best care he can get to cover his medical needs, I explained the logistics and the limitation of hospice service.  After lengthy discussion, he became open to hospice care, and would like to get more information.  I will reach out to to [facility] hospital liaison, and hopefully they will meet him and his family tomorrow. -Patient previously has agreed to DNR. -Patient still wants to meet physical therapy in hospital.  -Pain management per palliative care team -I will f/u in 2-3 days.  -I spent a total of 50 minutes for his visit today, more than 50% time on face-to-face counseling.   Malachy Mood, MD 11/16/2023

## 2023-11-16 NOTE — Progress Notes (Signed)
 Progress Note    Joseph Haney   WUJ:[id number]  DOB: Nov 20, 1975  DOA: 11/06/2023     10 PCP: Frederic Jericho, [name]  Initial CC: pain, SOB  Hospital Course: Joseph Haney is a 48 yo male with PMH metastatic colon cancer (lung, liver, bones), worsening debility/weakness, GERD who initially presented with shortness of breath, weakness, and uncontrolled pain. He has had gradual decline since December 2024 spending majority of time in bed and having ongoing anorexia with weight loss. He was admitted for pain control.  Palliative care and oncology were consulted on admission as well.  Interval History:   Long discussion bedside today with his son and 2 brothers presents. See GOC discussion below.  Unfortunately, very hard for them to understand why the recommendation is hospice and discontinuation of chemo. And they seem to have no intention of going home with hospice without seeking 2nd opinion.    Assessment and Plan: * Cancer associated pain - Palliative care consulted for assistance with pain management and GOC discussions - Currently on Dilaudid PCA with MS Contin being continuously adjusted  Stage IV carcinoma of colon (HCC) - follows with Dr. Mosetta Putt; was noted to have been too weak to follow up with oncology in the office as well recently  - with poor performance status, malnutrition, and tolerance, he has been recommended for transition to hospice but he is not yet ready - status has been updated to DNR at this time but main focus still remains pain control, but he's hopeful to walk more again; and still hopeful for "treatment again one day" - MRI T/L spine obtained 3/12 showing further metastatic lesions ("diffuse osseous disease in lumbar, lower thoracic, and posterior pelvis"; also noted with tumor extending into anterior epidural spaces along T12-L3 - evaluated by rad-onc on 3/13; suspect some of the lesion at T2-T4 may be amenable to radiation - decadron started 3/13 per  palliative care as well  - CT sim done on 3/14 and radiation starting 3/14 with plan for 4 fractions (end on 3/19)  Goals of care, counseling/discussion - palliative care following and continuing discussions as needed - had long discussion 3/17 also; FYI, patient also benefits from conversations with family present (today, son and 2 brothers present) and I do not feel he is truly following the full conversation at times and his understanding in general is very poor, especially with the complexity of his diagnoses; again, reviewed terminal and uncurable diagnosis and the progression notably as evidenced from last PET to current MRIs. Empathetic listening and validation given to all in the room; despite ~45 min conversation, their biggest "ask" continues to be hoping he can continue chemo (to the point of wanting to seek 2nd opinion at [facility], etc). Explained he'd have to substantially improve physically (both mobility and nutritionally) but probably other factors too needed by oncology. They do seem to be in the "bargaining" phase but also are just fixated on prolonging life even if not much quality (seems like since patient is talking and capable of engaging in conversations etc that they deem this as enough quality of life), however they will also say he was "getting out of bed" at least a few times just prior to admission so they did not agree that he was "bedbound" despite also having a pressure ulcer on admission (although it did worsen during hospitalization due to patient declining turns in bed etc and not getting up with PT hardly any). End point of the conversation was that he plans  to "try" and see what he's physically capable of in the coming days and going to "eat better" and they plan to ask Dr. Mosetta Putt for chemo and if not, then it sounds like if/when they are discharged home that they might pursue 2nd opinion elsewhere, however I also explained that unless he could essentially ambulate to a car  effectively and walk into an appointment (amongst other things), that there was also very low chance of being offered any treatment elsewhere (although they also mentioned [facility] as well)  Generalized weakness - Suspect progressive functional decline from underlying cancer but also due to patient being mostly bedridden at home -Given lower extremity weakness with Stage IV colon cancer, MRI T and L-spine ordered (see cancer)  Hypokalemia - replete as needed  Left arm swelling-resolved as of 11/15/2023 - IV infiltrated on 3/14 - CT forearm showed diffuse edema - overall has improved today, 3/15 when seen; good pulse, no signs of compartment syndrome - okay to d/c neurovasc checks - continue elevation and heat pad as needed - resolved as of 3/16  Drug-induced constipation - continue senna  Elevated LFTs - related to metastasis   HTN (hypertension) - continue coreg  Protein-calorie malnutrition, severe (HCC) - Body mass index is 21.28 kg/m. - continued poor intake and weight loss   Old records reviewed in assessment of this patient  Antimicrobials:   DVT prophylaxis:  enoxaparin (LOVENOX) injection 40 mg Start: 11/07/23 1000   Code Status:   Code Status: Limited: Do not attempt resuscitation (DNR) -DNR-LIMITED -Do Not Intubate/DNI   Mobility Assessment (Last 72 Hours)     Mobility Assessment     Row Name 11/16/23 0830 11/15/23 2000 11/15/23 0805 11/14/23 2030 11/14/23 1000   Does patient have an order for bedrest or is patient medically unstable No - Continue assessment Yes- Bedfast (Level 1) - Complete No - Continue assessment Yes- Bedfast (Level 1) - Complete No - Continue assessment   What is the highest level of mobility based on the progressive mobility assessment? Level 2 (Chairfast) - Balance while sitting on edge of bed and cannot stand Level 2 (Chairfast) - Balance while sitting on edge of bed and cannot stand Level 2 (Chairfast) -  Balance while sitting on edge of bed and cannot stand Level 2 (Chairfast) - Balance while sitting on edge of bed and cannot stand Level 2 (Chairfast) - Balance while sitting on edge of bed and cannot stand   Is the above level different from baseline mobility prior to current illness? Yes - Recommend PT order Yes - Recommend PT order Yes - Recommend PT order Yes - Recommend PT order Yes - Recommend PT order    Row Name 11/13/23 1920           Does patient have an order for bedrest or is patient medically unstable No - Continue assessment       What is the highest level of mobility based on the progressive mobility assessment? Level 2 (Chairfast) - Balance while sitting on edge of bed and cannot stand       Is the above level different from baseline mobility prior to current illness? Yes - Recommend PT order                Barriers to discharge: none Disposition Plan:  Home  HH orders placed:  Status is: Inpt  Objective: Blood pressure 130/73, pulse 95, temperature 98 F (36.7 C), temperature source Oral, resp. rate 17,  height 5\' 4"  (1.626 m), weight 56.2 kg, SpO2 100%.  Examination:  Physical Exam Constitutional:      General: He is not in acute distress.    Comments: Thin and in no distress  HENT:     Head: Normocephalic and atraumatic.     Mouth/Throat:     Mouth: Mucous membranes are moist.  Eyes:     Extraocular Movements: Extraocular movements intact.  Cardiovascular:     Rate and Rhythm: Normal rate and regular rhythm.  Pulmonary:     Effort: Pulmonary effort is normal. No respiratory distress.     Breath sounds: Normal breath sounds. No wheezing.  Abdominal:     General: Bowel sounds are normal. There is no distension.     Palpations: Abdomen is soft.     Tenderness: There is no abdominal tenderness.  Musculoskeletal:        General: Normal range of motion.     Cervical back: Normal range of motion and neck supple.     Comments: Left forearm back to normal  size, no further swelling nor tenderness.   Skin:    General: Skin is warm and dry.  Neurological:     Mental Status: He is alert.     Motor: Weakness (Unsure if effort related but no muscle tone in LE (0/5) , maybe 1/5 in LLE) present.  Psychiatric:        Attention and Perception: Attention normal.        Mood and Affect: Mood is depressed.        Speech: Speech normal.        Behavior: Behavior normal.        Thought Content: Thought content normal.      Consultants:  Oncology Palliative care  Radiation oncology   Procedures:    Data Reviewed: Results for orders placed or performed during the hospital encounter of 11/06/23 (from the past 24 hours)  CBC with Differential/Platelet     Status: Abnormal   Collection Time: 11/16/23  2:29 AM  Result Value Ref Range   WBC 12.6 (H) 4.0 - 10.5 K/uL   RBC 2.58 (L) 4.22 - 5.81 MIL/uL   Hemoglobin 6.8 (LL) 13.0 - 17.0 g/dL   HCT 82.9 (L) 56.2 - 13.0 %   MCV 88.4 80.0 - 100.0 fL   MCH 26.4 26.0 - 34.0 pg   MCHC 29.8 (L) 30.0 - 36.0 g/dL   RDW 86.5 (H) 78.4 - 69.6 %   Platelets 230 150 - 400 K/uL   nRBC 3.9 (H) 0.0 - 0.2 %   Neutrophils Relative % 72 %   Neutro Abs 9.6 (H) 1.7 - 7.7 K/uL   Band Neutrophils 4 %   Lymphocytes Relative 14 %   Lymphs Abs 1.8 0.7 - 4.0 K/uL   Monocytes Relative 1 %   Monocytes Absolute 0.1 0.1 - 1.0 K/uL   Eosinophils Relative 0 %   Eosinophils Absolute 0.0 0.0 - 0.5 K/uL   Basophils Relative 0 %   Basophils Absolute 0.0 0.0 - 0.1 K/uL   WBC Morphology Moderate Left Shift (>5% metas and myelos)    Metamyelocytes Relative 2 %   Myelocytes 7 %   Abs Immature Granulocytes 1.10 (H) 0.00 - 0.07 K/uL  Comprehensive metabolic panel     Status: Abnormal   Collection Time: 11/16/23  2:29 AM  Result Value Ref Range   Sodium 138 135 - 145 mmol/L   Potassium 4.8 3.5 - 5.1 mmol/L   Chloride  104 98 - 111 mmol/L   CO2 24 22 - 32 mmol/L   Glucose, Bld 127 (H) 70 - 99 mg/dL   BUN 38 (H) 6 - 20 mg/dL    Creatinine, Ser 8.29 0.61 - 1.24 mg/dL   Calcium 8.6 (L) 8.9 - 10.3 mg/dL   Total Protein 6.4 (L) 6.5 - 8.1 g/dL   Albumin 1.8 (L) 3.5 - 5.0 g/dL   AST 86 (H) 15 - 41 U/L   ALT 44 0 - 44 U/L   Alkaline Phosphatase 1,034 (H) 38 - 126 U/L   Total Bilirubin 0.5 0.0 - 1.2 mg/dL   GFR, Estimated >56 >21 mL/min   Anion gap 10 5 - 15  Magnesium     Status: None   Collection Time: 11/16/23  2:29 AM  Result Value Ref Range   Magnesium 2.1 1.7 - 2.4 mg/dL  Type and screen [facility]     Status: None (Preliminary result)   Collection Time: 11/16/23  3:26 AM  Result Value Ref Range   ABO/RH(D) O POS    Antibody Screen NEG    Sample Expiration 11/19/2023,2359    Unit Number [id number]    Blood Component Type RBC LR PHER2    Unit division 00    Status of Unit ISSUED    Transfusion Status OK TO TRANSFUSE    Crossmatch Result      Compatible Performed at [facility], [address] [place], [place] [address]   Prepare RBC (crossmatch)     Status: None   Collection Time: 11/16/23  3:29 AM  Result Value Ref Range   Order Confirmation      ORDER PROCESSED BY BLOOD BANK Performed at [facility], [address] [place], [place] [address]   Hemoglobin and hematocrit, blood     Status: Abnormal   Collection Time: 11/16/23  8:59 AM  Result Value Ref Range   Hemoglobin 8.8 (L) 13.0 - 17.0 g/dL   HCT 10.2 (L) 72.5 - 36.6 %    I have reviewed pertinent nursing notes, vitals, labs, and images as necessary. I have ordered labwork to follow up on as indicated.  I have reviewed the last notes from staff over past 24 hours. I have discussed patient's care plan and test results with nursing staff, CM/SW, and other staff as appropriate.  Time spent: Greater than 50% of the 85 minute visit was spent in counseling/coordination of care for the patient as laid out in the A&P.   LOS: 10 days   Lewie Chamber, MD [facility] 11/16/2023, 3:51 PM

## 2023-11-16 NOTE — Progress Notes (Signed)
   11/16/23 1140  Spiritual Encounters  Type of Visit Initial  Care provided to: Pt and family  Referral source Physician  Reason for visit Grief/loss (accepting prognosis)  Spiritual Framework  Presenting Themes Goals in life/care;Values and beliefs   Per referral from Dr. Patterson Hammersmith, I met with Joseph Haney, his brother, and son.  [name] expressed feeling overwhelmed after meeting with Dr. Patterson Hammersmith. He shared his desire to maintain hope in accordance with his faith outlook. Family shared a similar theme, all also expressing their ongoing grief process after losing [name] last August.  I provided compassionate presence and active listening. I sought to establish a relationship of trust and care in order to follow-up and continue to explore feelings around goals of care.  Laisa Larrick L. Sophronia Simas, M.Div [phone]

## 2023-11-16 NOTE — Progress Notes (Signed)
 OT Cancellation Note  Patient Details Name: Joseph Haney MRN: [id number] DOB: 10-03-1975   Cancelled Treatment:    Reason Eval/Treat Not Completed: Patient at procedure or test/ unavailable. Transport taking pt to radiation upon OT arrival. OT will follow up next available time  Galen Manila 11/16/2023, 2:03 PM

## 2023-11-16 NOTE — Progress Notes (Signed)
 PT Cancellation Note  Patient Details Name: Joseph Haney MRN: [id number] DOB: [birth year]   Cancelled Treatment:     Chaplain exiting room, "You may want to try later, he just received some bad news".  Then Pt out of room at Radiation.  Rehab Team to continue to follow and attempt another day as schedule permits.  Felecia Shelling  PTA Acute  Rehabilitation Services Office M-F          [phone]

## 2023-11-17 ENCOUNTER — Ambulatory Visit
Admit: 2023-11-17 | Discharge: 2023-11-17 | Disposition: A | Discharge status: Home / Self Care | Attending: Radiation Oncology

## 2023-11-17 ENCOUNTER — Other Ambulatory Visit: Payer: Self-pay

## 2023-11-17 DIAGNOSIS — C189 Malignant neoplasm of colon, unspecified: Secondary | ICD-10-CM | POA: Diagnosis not present

## 2023-11-17 DIAGNOSIS — G893 Neoplasm related pain (acute) (chronic): Secondary | ICD-10-CM | POA: Diagnosis not present

## 2023-11-17 DIAGNOSIS — Z7189 Other specified counseling: Secondary | ICD-10-CM | POA: Diagnosis not present

## 2023-11-17 DIAGNOSIS — R531 Weakness: Secondary | ICD-10-CM | POA: Diagnosis not present

## 2023-11-17 LAB — RAD ONC ARIA SESSION SUMMARY
Course Elapsed Days: 4
Plan Fractions Treated to Date: 3
Plan Prescribed Dose Per Fraction: 5 Gy
Plan Total Fractions Prescribed: 4
Plan Total Prescribed Dose: 20 Gy
Reference Point Dosage Given to Date: 15 Gy
Reference Point Session Dosage Given: 5 Gy
Session Number: 3

## 2023-11-17 LAB — TYPE AND SCREEN
ABO/RH(D): O POS
Antibody Screen: NEGATIVE
Unit division: 0

## 2023-11-17 LAB — BPAM RBC
Blood Product Expiration Date: 202504132359
ISSUE DATE / TIME: 202503170456
Unit Type and Rh: 5100

## 2023-11-17 MED ORDER — ENSURE MAX PROTEIN PO LIQD
11.0000 [oz_av] | Freq: Two times a day (BID) | ORAL | Status: DC
  Administered 2023-11-18 – 2023-12-01 (×14): 11 [oz_av] via ORAL
  Filled 2023-11-17 (×2): qty 330

## 2023-11-17 NOTE — Progress Notes (Signed)
 Daily Progress Note   Patient Name: Joseph Haney       Date: 11/17/2023 DOB: Mar 03, 1976  Age: 48 y.o. MRN#: [id number] Attending Physician: Lewie Chamber, MD Primary Care Physician: Frederic Jericho, [name] Admit Date: 11/06/2023 Length of Stay: 11 days  Reason for Consultation/Follow-up: Establishing goals of care, Non pain symptom management, and Pain control  Subjective:   CC: Following up today regarding continued discussions about planning.  Palliative medicine team following up regarding symptom management.  Subjective:  Reviewed EMR prior to presenting to bedside.  Remains on Dilaudid PCA.   Reviewed with patient and son about discussions from yesterday, also discussed with TRH MD as well.  Patient states that he is still not coming to terms with the serious irreversible nature of his illness, he states that his entire family plans to discuss what to do next within the upcoming few days.  Patient states his last radiation dose is tomorrow. Patient asks about role and scope of radiation, explained to him to the best of my ability.  He doesn't want to feel like he's being "pushed out" of the hospital. I discussed with him about hospice role again.  Continues to require PCA.     Spent time answering questions as able.  Noted palliative medicine team continue to follow along and engage in conversations as able.  Discussed care with IDT after visit to coordinate care including hospitalist.  Objective:   Vital Signs:  BP 98/87 (BP Location: Right Arm)   Pulse 72   Temp 98.1 F (36.7 C)   Resp 18   Ht 5\' 4"  (1.626 m)   Wt 56.2 kg   SpO2 100%   BMI 21.28 kg/m   Physical Exam: General: NAD, alert, cachectic, chronically ill-appearing Cardiovascular: RRR Respiratory: no increased work of breathing noted, not in respiratory distress Neuro: A&Ox4, following commands easily Psych: appropriately answers all questions  Imaging:  I personally reviewed recent imaging.    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  Recent imaging has shown progression of the disease and patient's bone.  Oncology consulted for recommendations.  Palliative medicine team consulted to assist with symptom management.  Recommendations/Plan: # Complex medical decision making/goals of care:  - Discussed care with patient as detailed above in HPI.  Have continued to have multiple lengthy conversations about patient's medical status, prognosis, not being a candidate for further cancer directed therapy based on his poor functional and nutritional status, and discussed possible support moving forward through home hospice.  Patient continues to struggle with processing this information and continues to focus on getting "better".  Palliative medicine team continuing to follow along to engage in conversations as able.  -  Code Status: Limited: Do not attempt resuscitation (DNR) -DNR-LIMITED -Do Not Intubate/DNI   # Symptom management: Patient is receiving these palliative interventions for symptom management with an intent to improve quality of life.   -Pain, severe in setting of metastatic colon cancer with mets to bone, liver, and lung Remains on IV Dilaudid PCA  also on MS Contin   Also on IV steroids and NSAID are assisting with edema management.   -Continue MS Contin to 90 mg every 8 hours.     -Continue PCA with IV Dilaudid 0.75 mg every 10 minutes bolus.  No continuous infusion as on long-acting oral  opioids.   -Decrease IV dexamethasone 4 mg 8 hours.  Continue to titrate medication down as able.   -Continue Celebrex 200 mg twice daily for total of 10 doses.   Patient receiving Protonix 40 mg daily.   -Consider use of bisphosphonate    -Agree with radiation course to assist with pain  management as unfortunately opioids have difficulty managing bone related cancer pain without requiring adverse effects such as sedation to obtain pain management   -Mood/appetite   -Personally reviewed EKG which noted QTc on 11/09/2023 to be 432   -Continue olanzapine to 5 mg nightly   -Constipation   -Continue senna 1 tab twice daily  # Psychosocial Support:  -Brother, mother, son  -Chaplain following for support  # Discharge Planning: To Be Determined  Discussed with: Patient, patient's son, hospitalist   Thank you for allowing the palliative care team to participate in the care Joseph Maduro.  High MDM Joseph Hawking MD Palliative Care Provider PMT # [phone]  If patient remains symptomatic despite maximum doses, please call PMT at [phone] between 0700 and 1900. Outside of these hours, please call attending, as PMT does not have night coverage.

## 2023-11-17 NOTE — Progress Notes (Signed)
 Nutrition Follow-up  DOCUMENTATION CODES:   Non-severe (moderate) malnutrition in context of chronic illness  INTERVENTION:  - Regular diet.  - Ensure Max po BID, each supplement provides 150 kcal and 30 grams of protein.  - Encourage intake of frequent meals/snacks throughout the day in addition to Ensure.  - Monitor weight trends.   NUTRITION DIAGNOSIS:   Moderate Malnutrition related to chronic illness, cancer and cancer related treatments as evidenced by moderate fat depletion, moderate muscle depletion, percent weight loss (32% in 7 months). *ongoing  GOAL:   Patient will meet greater than or equal to 90% of their needs *progressing  MONITOR:   PO intake, Supplement acceptance, Weight trends  REASON FOR ASSESSMENT:   Consult Assessment of nutrition requirement/status  ASSESSMENT:   48 y.o. male with PMH significant of metastatic colon cancer including to the liver previously on chemotherapy and s/p partial colectomy, prior history of alcohol abuse, seizure disorder, GERD who presented with significant pain.  Patient reports his intake is somewhat improved from what it had been.  Shares he has been ordering 3 meals a day, eating what he can of them, and then drinking a Premier Protein (brought in by family) to supplement kcals/protein.  Per RN documentation, patient eating 0-50% of meals.  Patient endorses drinking about 1.5 Premier Proteins a day.  Encouraged patient to continue to order 3 meals a day and eat as much as he can of them. Also discussed can always have outside food brought in if able/desired.  Will change patient to Ensure Max as well for an option he can get while inpatient. Of note, patient currently getting radiation.    Medications reviewed and include: Morphine, Senokot  Labs reviewed:  No BMP today   Diet Order:   Diet Order             Diet regular Room service appropriate? Yes; Fluid consistency: Thin  Diet effective now                    EDUCATION NEEDS:  Education needs have been addressed  Skin:  Skin Assessment: Skin Integrity Issues: Skin Integrity Issues:: Stage II Stage II: Sacrum; Left Buttocks; Scrotum  Last BM:  3/16  Height:  Ht Readings from Last 1 Encounters:  11/06/23 5\' 4"  (1.626 m)   Weight:  Wt Readings from Last 1 Encounters:  11/06/23 56.2 kg    BMI:  Body mass index is 21.28 kg/m.  Estimated Nutritional Needs:  Kcal:  1950-2150 kcals Protein:  85-100 grams Fluid:  >/= 2L    Shelle Iron RD, LDN Contact via Secure Chat.

## 2023-11-17 NOTE — Progress Notes (Signed)
 Occupational Therapy Treatment Patient Details Name: Joseph Haney MRN: [id number] DOB: Mar 07, 1976 Today's Date: 11/17/2023   History of present illness Joseph Haney is a 48 y.o. male admitted with cancer associated pain. PMH: colon cancer (s/p partial colectomy 2022), liver and pelvic bone metastasis   OT comments  Pt agreeable to work with OT. Pt's brother and son present and offering encouragement.  Pt sup - sit mod A sup - sit EOB with use of rails requiring increased time and effort, max A to scoot hips to EOB. Pt sat EOB ~6 minutes for activity. Pt reports that he cannot "feel" his feet (reported to RN and MD). Session ended due to transportation arriving to take pt to radiation. Pt returned to supine in bed max A. OT will continue to follow acutely to maximize level of function and safety      If plan is discharge home, recommend the following:  Other (comment) (24/7 assist with ADLs)   Equipment Recommendations  BSC/3in1;Tub/shower bench;Hospital bed;Hoyer lift    Recommendations for Other Services      Precautions / Restrictions Precautions Precautions: Fall;Other (comment) Recall of Precautions/Restrictions: Intact Precaution/Restrictions Comments: Log rolled during bed mobility for comfort Restrictions Weight Bearing Restrictions Per Provider Order: No       Mobility Bed Mobility Overal bed mobility: Needs Assistance Bed Mobility: Rolling, Sidelying to Sit, Sit to Supine Rolling: Mod assist Sidelying to sit: Mod assist, HOB elevated, Used rails   Sit to supine: Max assist   General bed mobility comments: required increased time and effort to complete to sit EOB. Pt sat EOB ~6 minutes for functional tasks    Transfers                   General transfer comment: not attempted, transport coming to take pt to radiation     Balance Overall balance assessment: Needs assistance Sitting-balance support: Feet supported, Single extremity supported Sitting  balance-Leahy Scale: Fair                                     ADL either performed or assessed with clinical judgement   ADL Overall ADL's : Needs assistance/impaired     Grooming: Wash/dry hands;Wash/dry face;Minimal assistance;Sitting Grooming Details (indicate cue type and reason): EOB                               General ADL Comments: mod A sup - sit EOB with use of rails. Pt sat EOB ~6 minutes for activity. Pt reports that he cannot "feel" his feet    Extremity/Trunk Assessment Upper Extremity Assessment Upper Extremity Assessment: Generalized weakness   Lower Extremity Assessment Lower Extremity Assessment: Defer to PT evaluation   Cervical / Trunk Assessment Cervical / Trunk Assessment: Kyphotic    Vision Baseline Vision/History: 0 No visual deficits Ability to See in Adequate Light: 0 Adequate Patient Visual Report: No change from baseline     Perception     Praxis     Communication Communication Communication: No apparent difficulties   Cognition Arousal: Alert Behavior During Therapy: WFL for tasks assessed/performed Cognition: No apparent impairments                               Following commands: Intact        Cueing  Exercises      Shoulder Instructions       General Comments      Pertinent Vitals/ Pain       Pain Assessment Pain Assessment: 0-10 Pain Score: 7  Pain Location: upper body Pain Descriptors / Indicators: Pressure Pain Intervention(s): Monitored during session, Limited activity within patient's tolerance, PCA encouraged  Home Living                                          Prior Functioning/Environment              Frequency  Min 2X/week        Progress Toward Goals  OT Goals(current goals can now be found in the care plan section)  Progress towards OT goals: Progressing toward goals     Plan      Co-evaluation                  AM-PAC OT "6 Clicks" Daily Activity     Outcome Measure   Help from another person eating meals?: None Help from another person taking care of personal grooming?: A Little Help from another person toileting, which includes using toliet, bedpan, or urinal?: Total Help from another person bathing (including washing, rinsing, drying)?: A Lot Help from another person to put on and taking off regular upper body clothing?: A Little Help from another person to put on and taking off regular lower body clothing?: Total 6 Click Score: 14    End of Session    OT Visit Diagnosis: Unsteadiness on feet (R26.81);Muscle weakness (generalized) (M62.81)   Activity Tolerance Patient limited by fatigue   Patient Left in bed;with call bell/phone within reach;with nursing/sitter in room;Other (comment)   Nurse Communication Mobility status        Time: 1610-9604 OT Time Calculation (min): 16 min  Charges: OT Treatments $Therapeutic Activity: 8-22 mins  Galen Manila 11/17/2023, 2:15 PM

## 2023-11-17 NOTE — Plan of Care (Signed)

## 2023-11-17 NOTE — Progress Notes (Signed)
 Progress Note    Joseph Haney   EAV:[id number]  DOB: [birth year]  DOA: 11/06/2023     11 PCP: Joseph Jericho, [name]  Initial CC: pain, SOB  Haney Course: [name] is a 48 yo male with PMH metastatic colon Joseph (lung, liver, bones), worsening debility/weakness, GERD who initially presented with shortness of breath, weakness, and uncontrolled pain. He has had gradual decline since December 2024 spending majority of time in bed and having ongoing anorexia with weight loss. He was admitted for pain control.  Palliative care and oncology were consulted on admission as well.  Interval History:   No events overnight.  Resting in bed in his usual state.  He does like the air mattress better.  Has meeting today with hospice liaison with family as well.  Assessment and Plan: * Joseph associated pain - Palliative care consulted for assistance with pain management and GOC discussions - Currently on Dilaudid PCA with MS Contin being continuously adjusted  Stage IV carcinoma of colon (HCC) - follows with Dr. Mosetta Haney; was noted to have been too weak to follow up with oncology in the office as well recently  - with poor performance status, malnutrition, and tolerance, he has been recommended for transition to hospice but he is not yet ready - status has been updated to DNR at this time but main focus still remains pain control, but he's hopeful to walk more again; and still hopeful for "treatment again one day" - MRI T/L spine obtained 3/12 showing further metastatic lesions ("diffuse osseous disease in lumbar, lower thoracic, and posterior pelvis"; also noted with tumor extending into anterior epidural spaces along T12-L3 - evaluated by rad-onc on 3/13; suspect some of the lesion at T2-T4 may be amenable to radiation - decadron started 3/13 per palliative care as well  - CT sim done on 3/14 and radiation starting 3/14 with plan for 4 fractions (end on 3/19)  Goals of care,  counseling/discussion - palliative care following and continuing discussions as needed - had long discussion 3/17 also; FYI, patient also benefits from conversations with family present (today, son and 2 brothers present) and I do not feel he is truly following the full conversation at times and his understanding in general is very poor, especially with the complexity of his diagnoses; again, reviewed terminal and uncurable diagnosis and the progression notably as evidenced from last PET to current MRIs. Empathetic listening and validation given to all in the room; despite ~45 min conversation, their biggest "ask" continues to be hoping he can continue chemo (to the point of wanting to seek 2nd opinion at [facility], etc). Explained he'd have to substantially improve physically (both mobility and nutritionally) but probably other factors too needed by oncology. They do seem to be in the "bargaining" phase but also are just fixated on prolonging life even if not much quality (seems like since patient is talking and capable of engaging in conversations etc that they deem this as enough quality of life), however they will also say he was "getting out of bed" at least a few times just prior to admission so they did not agree that he was "bedbound" despite also having a pressure ulcer on admission (although it did worsen during hospitalization due to patient declining turns in bed etc and not getting up with PT hardly any). End point of the conversation was that he plans to "try" and see what he's physically capable of in the coming days and going to "eat better" and they  plan to ask Dr. Mosetta Haney for chemo and if not, then it sounds like if/when they are discharged home that they might pursue 2nd opinion elsewhere, however I also explained that unless he could essentially ambulate to a car effectively and walk into an appointment (amongst other things), that there was also very low chance of being offered any treatment elsewhere  (although they also mentioned [facility] as well)  Generalized weakness - Suspect progressive functional decline from underlying Joseph but also due to patient being mostly bedridden at home -Given lower extremity weakness with Stage IV colon Joseph, MRI T and L-spine ordered (see Joseph)  Hypokalemia - replete as needed  Left arm swelling-resolved as of 11/15/2023 - IV infiltrated on 3/14 - CT forearm showed diffuse edema - overall has improved today, 3/15 when seen; good pulse, no signs of compartment syndrome - okay to d/c neurovasc checks - continue elevation and heat pad as needed - resolved as of 3/16  Drug-induced constipation - continue senna  Elevated LFTs - related to metastasis   HTN (hypertension) - continue coreg  Protein-calorie malnutrition, severe (HCC) - Body mass index is 21.28 kg/m. - continued poor intake and weight loss   Old records reviewed in assessment of this patient  Antimicrobials:   DVT prophylaxis:  enoxaparin (LOVENOX) injection 40 mg Start: 11/07/23 1000   Code Status:   Code Status: Limited: Do not attempt resuscitation (DNR) -DNR-LIMITED -Do Not Intubate/DNI   Mobility Assessment (Last 72 Hours)     Mobility Assessment     Row Name 11/17/23 1413 11/16/23 2000 11/16/23 0830 11/15/23 2000 11/15/23 0805   Does patient have an order for bedrest or is patient medically unstable -- Yes- Bedfast (Level 1) - Complete No - Continue assessment Yes- Bedfast (Level 1) - Complete No - Continue assessment   What is the highest level of mobility based on the progressive mobility assessment? Level 2 (Chairfast) - Balance while sitting on edge of bed and cannot stand Level 2 (Chairfast) - Balance while sitting on edge of bed and cannot stand Level 2 (Chairfast) - Balance while sitting on edge of bed and cannot stand Level 2 (Chairfast) - Balance while sitting on edge of bed and cannot stand Level 2 (Chairfast) - Balance while  sitting on edge of bed and cannot stand   Is the above level different from baseline mobility prior to current illness? -- Yes - Recommend PT order Yes - Recommend PT order Yes - Recommend PT order Yes - Recommend PT order    Row Name 11/14/23 2030           Does patient have an order for bedrest or is patient medically unstable Yes- Bedfast (Level 1) - Complete       What is the highest level of mobility based on the progressive mobility assessment? Level 2 (Chairfast) - Balance while sitting on edge of bed and cannot stand       Is the above level different from baseline mobility prior to current illness? Yes - Recommend PT order                Barriers to discharge: none Disposition Plan:  Home  HH orders placed:  Status is: Inpt  Objective: Blood pressure 98/87, pulse 72, temperature 98.1 F (36.7 C), resp. rate 10, height 5\' 4"  (1.626 m), weight 56.2 kg, SpO2 98%.  Examination:  Physical Exam Constitutional:      General: He is not in acute distress.  Comments: Thin and in no distress  HENT:     Head: Normocephalic and atraumatic.     Mouth/Throat:     Mouth: Mucous membranes are moist.  Eyes:     Extraocular Movements: Extraocular movements intact.  Cardiovascular:     Rate and Rhythm: Normal rate and regular rhythm.  Pulmonary:     Effort: Pulmonary effort is normal. No respiratory distress.     Breath sounds: Normal breath sounds. No wheezing.  Abdominal:     General: Bowel sounds are normal. There is no distension.     Palpations: Abdomen is soft.     Tenderness: There is no abdominal tenderness.  Musculoskeletal:        General: Normal range of motion.     Cervical back: Normal range of motion and neck supple.     Comments: Left forearm back to normal size, no further swelling nor tenderness.   Skin:    General: Skin is warm and dry.  Neurological:     Mental Status: He is alert.     Motor: Weakness (Unsure if effort related but no muscle tone in LE  (0/5) , maybe 1/5 in LLE) present.  Psychiatric:        Attention and Perception: Attention normal.        Mood and Affect: Mood is depressed.        Speech: Speech normal.        Behavior: Behavior normal.        Thought Content: Thought content normal.      Consultants:  Oncology Palliative care  Radiation oncology   Procedures:    Data Reviewed: No results found for this or any previous visit (from the past 24 hours).   I have reviewed pertinent nursing notes, vitals, labs, and images as necessary. I have ordered labwork to follow up on as indicated.  I have reviewed the last notes from staff over past 24 hours. I have discussed patient's care plan and test results with nursing staff, CM/SW, and other staff as appropriate.  Time spent: Greater than 50% of the 85 minute visit was spent in counseling/coordination of care for the patient as laid out in the A&P.   LOS: 11 days   Lewie Chamber, MD [facility] 11/17/2023, 2:22 PM

## 2023-11-18 ENCOUNTER — Ambulatory Visit
Admit: 2023-11-18 | Discharge: 2023-11-18 | Disposition: A | Discharge status: Home / Self Care | Attending: Radiation Oncology

## 2023-11-18 ENCOUNTER — Other Ambulatory Visit: Payer: Self-pay

## 2023-11-18 ENCOUNTER — Ambulatory Visit

## 2023-11-18 DIAGNOSIS — G893 Neoplasm related pain (acute) (chronic): Secondary | ICD-10-CM | POA: Diagnosis not present

## 2023-11-18 DIAGNOSIS — R531 Weakness: Secondary | ICD-10-CM | POA: Diagnosis not present

## 2023-11-18 LAB — RAD ONC ARIA SESSION SUMMARY
Course Elapsed Days: 5
Plan Fractions Treated to Date: 4
Plan Prescribed Dose Per Fraction: 5 Gy
Plan Total Fractions Prescribed: 4
Plan Total Prescribed Dose: 20 Gy
Reference Point Dosage Given to Date: 20 Gy
Reference Point Session Dosage Given: 5 Gy
Session Number: 4

## 2023-11-18 MED ORDER — LACTULOSE 10 GM/15ML PO SOLN
20.0000 g | Freq: Two times a day (BID) | ORAL | Status: DC
  Administered 2023-11-19 – 2023-12-02 (×7): 20 g via ORAL
  Filled 2023-11-18 (×17): qty 30

## 2023-11-18 MED ORDER — CALCIUM CARBONATE ANTACID 500 MG PO CHEW
2.0000 | CHEWABLE_TABLET | Freq: Three times a day (TID) | ORAL | Status: DC | PRN
Start: 2023-11-18 — End: 2023-12-03
  Administered 2023-11-18 – 2023-11-26 (×9): 400 mg via ORAL
  Filled 2023-11-18 (×10): qty 2

## 2023-11-18 NOTE — Plan of Care (Signed)

## 2023-11-18 NOTE — Progress Notes (Signed)
 Occupational Therapy Treatment Patient Details Name: Joseph Haney MRN: [id number] DOB: December 28, 1975 Today's Date: 11/18/2023   History of present illness Joseph Haney is a 48 y.o. male admitted with cancer associated pain. PMH: colon cancer (s/p partial colectomy 2022), liver and pelvic bone metastasis   OT comments  Pt agreeable to activity. Mod A with increased time and effort to sit EOB. Pt seated EOB ~18-20 minutes with max - mod effort to balance/steady himself using B UEs. LOB x 1 when using R UE to show MD where his pain is. Mod A for balance required to use R UE to wash face and during UE AROM. At end of session, pt asking many questions about when and how he will get better, as well as about prognosis, pain meds and radiation (encouraged pt to discuss pain meds, prognosis and radiation with MDs) .OT educated pt on importance of consistent participation and effort with therapies and how he is currently doing with therapy sessions. OT will continue to follow acutely to maximize level of function and safety      If plan is discharge home, recommend the following:  A lot of help with bathing/dressing/bathroom;Two people to help with walking and/or transfers   Equipment Recommendations  BSC/3in1;Tub/shower bench;Hospital bed;Hoyer lift    Recommendations for Other Services      Precautions / Restrictions Precautions Precautions: Fall;Other (comment) Recall of Precautions/Restrictions: Intact Precaution/Restrictions Comments: Log rolled during bed mobility for comfort Restrictions Weight Bearing Restrictions Per Provider Order: No       Mobility Bed Mobility Overal bed mobility: Needs Assistance Bed Mobility: Rolling, Sidelying to Sit, Sit to Supine Rolling: Mod assist Sidelying to sit: Mod assist, HOB elevated, Used rails       General bed mobility comments: required increased time and effort to complete to sit EOB. Pt sat EOB ~18-20 minutes for functional tasks. LOB x 1  when pt attempting to use R UE to show MD where pain is    Transfers Overall transfer level: Needs assistance   Transfers: Sit to/from Stand Sit to Stand: Total assist, +2 physical assistance           General transfer comment: sit - stand frrom EOB, pt unable to extend knees, up at EOB ~5 seconds     Balance Overall balance assessment: Needs assistance Sitting-balance support: Feet supported, Single extremity supported Sitting balance-Leahy Scale: Fair Sitting balance - Comments: Pt seated EOB ~18-20 minutes with max - mod effort to balance/steady himself using B UEs.LOB x 1 when using R UE to show MD where his pain is. Mod A for balance required to use R UE to wash face and during UE AROM   Standing balance support: Bilateral upper extremity supported, Reliant on assistive device for balance Standing balance-Leahy Scale: Zero                             ADL either performed or assessed with clinical judgement   ADL Overall ADL's : Needs assistance/impaired     Grooming: Wash/dry hands;Wash/dry face;Minimal assistance;Sitting Grooming Details (indicate cue type and reason): EOB, impaired balance                               General ADL Comments: mod A sup - sit EOB with use of rails. Pt sat EOB ~18-20 minutes for activity. Total A +2 sit - stand x 5 seconds, unable to  extend knees    Extremity/Trunk Assessment Upper Extremity Assessment Upper Extremity Assessment: Generalized weakness       Cervical / Trunk Assessment Cervical / Trunk Assessment: Kyphotic    Vision Baseline Vision/History: 0 No visual deficits Ability to See in Adequate Light: 0 Adequate Patient Visual Report: No change from baseline     Perception     Praxis     Communication Communication Communication: No apparent difficulties   Cognition Arousal: Alert Behavior During Therapy: WFL for tasks assessed/performed Cognition: No apparent impairments                                Following commands: Intact        Cueing   Cueing Techniques: Verbal cues  Exercises      Shoulder Instructions       General Comments      Pertinent Vitals/ Pain       Pain Assessment Pain Assessment: 0-10 Pain Score: 5  Pain Location: upper body Pain Descriptors / Indicators: Pressure Pain Intervention(s): Monitored during session, Repositioned, PCA encouraged  Home Living                                          Prior Functioning/Environment              Frequency  Min 2X/week        Progress Toward Goals  OT Goals(current goals can now be found in the care plan section)  Progress towards OT goals: Progressing toward goals     Plan      Co-evaluation                 AM-PAC OT "6 Clicks" Daily Activity     Outcome Measure   Help from another person eating meals?: None Help from another person taking care of personal grooming?: A Little Help from another person toileting, which includes using toliet, bedpan, or urinal?: Total Help from another person bathing (including washing, rinsing, drying)?: A Lot Help from another person to put on and taking off regular upper body clothing?: A Little Help from another person to put on and taking off regular lower body clothing?: Total 6 Click Score: 14    End of Session Equipment Utilized During Treatment: Gait belt;Rolling walker (2 wheels)  OT Visit Diagnosis: Unsteadiness on feet (R26.81);Muscle weakness (generalized) (M62.81);Pain Pain - part of body:  (generalized)   Activity Tolerance Patient limited by fatigue;Patient limited by pain   Patient Left in bed;with call bell/phone within reach;with nursing/sitter in room   Nurse Communication          Time: 1610-9604 OT Time Calculation (min): 51 min  Charges: OT General Charges $OT Visit: 1 Visit OT Treatments $Self Care/Home Management : 8-22 mins $Therapeutic Activity: 23-37  mins    Galen Manila 11/18/2023, 2:16 PM

## 2023-11-18 NOTE — Progress Notes (Signed)
 Daily Progress Note   Patient Name: Joseph Haney       Date: 11/18/2023 DOB: 04/20/1976  Age: 48 y.o. MRN#: [id number] Attending Physician: Joseph Crape, DO Primary Care Physician: Joseph Jericho, [name] Admit Date: 11/06/2023 Length of Stay: 12 days  Reason for Consultation/Follow-up: Establishing goals of care, Non pain symptom management, and Pain control  Subjective:   CC: Following up today regarding continued discussions about planning.  Palliative medicine team following up regarding symptom management.  Subjective:  Reviewed EMR prior to presenting to bedside.  Remains on Dilaudid PCA.   Reviewed with patient and son again about his current condition and the overall plan of care.  Patient believes that he is participating very well with PT-OT.  He believes that he is pushing himself to gain strength.  Ongoing daily repeated discussions with the patient and family present at bedside about the serious incurable nature of the patient's condition.  Continuing to work on establishing trust relationship with the patient and also continuing symptom management as well as broad goals of care discussions.   Continues to require PCA.     Spent time answering questions as able.  Noted palliative medicine team continue to follow along and engage in conversations as able.  Discussed care with IDT after visit to coordinate care including hospitalist.  Objective:   Vital Signs:  BP 120/83   Pulse 85   Temp 98.6 F (37 C)   Resp 16   Ht 5\' 4"  (1.626 m)   Wt 87.2 kg   SpO2 100%   BMI 33.01 kg/m   Physical Exam: General: NAD, alert, cachectic, chronically ill-appearing Cardiovascular: RRR Respiratory: no increased work of breathing noted, not in respiratory distress Neuro: A&Ox4, following commands easily Psych: appropriately answers all questions  Imaging:  I personally reviewed recent imaging.   Assessment & Plan:   Assessment: Patient is a 48 year old male  with a past medical history of metastatic colon cancer with metastatic disease to lungs, bone, and liver who was admitted on 11/06/2023 for uncontrolled cancer associated pain.  Recent imaging has shown progression of the disease and patient's bone.  Oncology consulted for recommendations.  Palliative medicine team consulted to assist with symptom management.  Recommendations/Plan: # Complex medical decision making/goals of care:  - Discussed care with patient as detailed above in HPI.  Have continued to have multiple lengthy conversations about patient's medical status, prognosis, not being a candidate for further cancer directed therapy based on his poor functional and nutritional status, and discussed possible support moving forward through home hospice.  Patient continues to struggle with processing this information and continues to focus on getting "better".  Palliative medicine team continuing to follow along to engage in conversations as able.  -  Code Status: Limited: Do not attempt resuscitation (DNR) -DNR-LIMITED -Do Not Intubate/DNI   # Symptom management: Patient is receiving these palliative interventions for symptom management with an intent to improve quality of life.   -Pain, severe in setting of metastatic colon cancer with mets to bone, liver, and lung Remains on IV Dilaudid PCA  also on MS Contin   Also on IV steroids and NSAID are assisting with edema management.   -Continue MS Contin to 90 mg every 8 hours.     -Continue PCA with IV Dilaudid 0.75 mg every 10 minutes bolus.  No continuous infusion as on long-acting oral opioids.   -Decrease IV dexamethasone 4 mg 8 hours.  Continue to titrate medication down as able.   -  Continue Celebrex 200 mg twice daily for total of 10 doses.   Patient receiving Protonix 40 mg daily.   -Consider use of bisphosphonate    -Agree with radiation course to assist with pain management as unfortunately opioids have difficulty managing bone related  cancer pain without requiring adverse effects such as sedation to obtain pain management   -Mood/appetite   -Personally reviewed EKG which noted QTc on 11/09/2023 to be 432   -Continue olanzapine to 5 mg nightly   -Constipation   -Continue senna 1 tab twice daily  # Psychosocial Support:  -Brother, mother, son  -Chaplain following for support  # Discharge Planning: To Be Determined  Discussed with: Patient, patient's son, corresponded with TOC as well.  Thank you for allowing the palliative care team to participate in the care Joseph Haney Maduro.  High MDM Joseph Hawking MD Palliative Care Provider PMT # [phone]  If patient remains symptomatic despite maximum doses, please call PMT at [phone] between 0700 and 1900. Outside of these hours, please call attending, as PMT does not have night coverage.

## 2023-11-18 NOTE — Plan of Care (Signed)
 ?  Problem: Education: ?Goal: Knowledge of General Education information will improve ?Description: Including pain rating scale, medication(s)/side effects and non-pharmacologic comfort measures ?Outcome: Progressing ?  ?Problem: Health Behavior/Discharge Planning: ?Goal: Ability to manage health-related needs will improve ?Outcome: Progressing ?  ?Problem: Coping: ?Goal: Level of anxiety will decrease ?Outcome: Progressing ?  ?

## 2023-11-18 NOTE — Progress Notes (Signed)
 PROGRESS NOTE    Joseph Haney  YQM:[id number] DOB: Jun 24, 1976 DOA: 11/06/2023 PCP: Frederic Jericho, [name]  Chief Complaint  Patient presents with   Shortness of Breath   Weakness    Hospital Course:  Joseph Haney is 48 y.o. 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  He was admitted on 3/7 for pain control.  Palliative care and oncology were consulted.  Patient has required high-dose opioids for adequate pain control.  Currently necessitating Dilaudid PCA with MS Contin.  At this time family would like to continue to pursue all aggressive measures and is not interested in hospice discussions.  Subjective: On evaluation today patient is working with occupational therapy.  He is sitting on the edge of the bed.  He is using his arms for support.  When he tries to motion he falls over.  He lacks any core strength. He continues to report that the pain medications are not lasting very long.   Objective: Vitals:   11/18/23 0443 11/18/23 0500 11/18/23 0733 11/18/23 0820  BP: 126/64   127/79  Pulse: 76   83  Resp: 16  20 15   Temp: (!) 97.5 F (36.4 C)   97.8 F (36.6 C)  TempSrc: Oral   Oral  SpO2: 100%   100%  Weight:  87.2 kg    Height:        Intake/Output Summary (Last 24 hours) at 11/18/2023 0927 Last data filed at 11/18/2023 0600 Gross per 24 hour  Intake 199.5 ml  Output 1875 ml  Net -1675.5 ml   Filed Weights   11/06/23 1450 11/18/23 0500  Weight: 56.2 kg 87.2 kg    Examination: General exam: Appears calm and comfortable, frail appearing. Respiratory system: No work of breathing, symmetric chest wall expansion Cardiovascular system: S1 & S2 heard, RRR.  Gastrointestinal system: Abdomen is nondistended, soft and nontender.  Neuro: Alert and oriented. No focal neurological deficits. Extremities: Symmetric, expected ROM Skin: No  rashes, lesions Psychiatry:  Mood & affect appropriate for situation.   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 [facility])   Need for emotional support   Medication management   Palliative care encounter   Appetite loss   HTN (hypertension)   Pressure injury of skin   Elevated LFTs   Counseling and coordination of care   Drug-induced constipation    Cancer associated pain - Currently on Dilaudid PCA with MS Contin being continuously adjusted -- Cont decadron - Palliative care consulted for assistance with pain management and ongoing GOC discussions  Stage IV carcinoma of colon, 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, liver, bones - MRIs and PET scans reveal worsening of disease - Multiple GOC conversations with family and patient, at this time they are requesting continued aggressive management - Family continues to hope to pursue chemotherapy, at this time does not have enough functional reserve to pursue this - If patient gains enough strength to ambulate they will attempt to discharge and seek second opinion elsewhere - Oncology following and agrees with the above - Please see GOC conversation as documented from 3/18 note  Generalized weakness -  Progressive functional decline from underlying malignancy - Repeat MRIs reveal worsening metastatic spread - Essentially bedbound at home, family does report he was able to intermittently ambulate.  Multiple pressure ulcers present on arrival.  Hypokalemia - Replace as needed  Left arm swelling, resolved - Secondary to IV infiltration on 3/14.  CT forearm with diffuse edema  Drug-induced constipation - Secondary to high opioid burden - Continue  aggressive bowel regimen  Elevated LFTs - Secondary to metastasis  Hypertension - Continue Coreg  Severe protein calorie malnutrition - Worsening anorexia secondary to malignancy - Cont supplements   DVT prophylaxis: Lovenox   Code Status: Limited: Do not attempt resuscitation (DNR) -DNR-LIMITED -Do Not Intubate/DNI  Family Communication:  Discussed directly with patient Disposition:  Inpatient still hospitalized for pain control, will discharge to home with [facility] vs hospice when he is able to wean from PCA  Consultants:  Treatment Team:  Consulting Physician: Malachy Mood, MD  Procedures:    Antimicrobials:  Anti-infectives (From admission, onward)    None       Data Reviewed: I have personally reviewed following labs and imaging studies CBC: Recent Labs  Lab 11/16/23 0229 11/16/23 0859  WBC 12.6*  --   NEUTROABS 9.6*  --   HGB 6.8* 8.8*  HCT 22.8* 28.3*  MCV 88.4  --   PLT 230  --    Basic Metabolic Panel: Recent Labs  Lab 11/16/23 0229  NA 138  K 4.8  CL 104  CO2 24  GLUCOSE 127*  BUN 38*  CREATININE 0.73  CALCIUM 8.6*  MG 2.1   GFR: Estimated Creatinine Clearance: 111.2 mL/min (by C-G formula based on SCr of 0.73 mg/dL). Liver Function Tests: Recent Labs  Lab 11/16/23 0229  AST 86*  ALT 44  ALKPHOS 1,034*  BILITOT 0.5  PROT 6.4*  ALBUMIN 1.8*   CBG: No results for input(s): "GLUCAP" in the last 168 hours.  No results found for this or any previous visit (from the past 240 hours).   Radiology Studies: No results found.  Scheduled Meds:  carvedilol  25 mg Oral BID WC   Chlorhexidine Gluconate Cloth  6 each Topical Daily   dexamethasone  4 mg Intravenous Q8H   enoxaparin (LOVENOX) injection  40 mg Subcutaneous Q24H   HYDROmorphone   Intravenous Q4H   morphine  90 mg Oral Q8H   OLANZapine  5 mg Oral QHS   pantoprazole  40 mg Oral Daily   potassium chloride SA  20 mEq Oral Daily   Ensure Max Protein  11 oz Oral BID   senna  1 tablet  Oral BID   sodium chloride flush  10-40 mL Intracatheter Q12H   Continuous Infusions:   LOS: 12 days  MDM: Patient is high risk for one or more organ failure.  They necessitate ongoing hospitalization for continued IV therapies and subsequent lab monitoring. Total time spent interpreting labs and vitals, coordinating care amongst consultants and care team members, directly assessing and discussing care with the patient and/or family: 55 min    Debarah Crape, DO [facility]  To contact the attending physician between 7A-7P please use Epic Chat. To contact the covering physician during after hours 7P-7A, please review Amion.   11/18/2023, 9:27 AM   *This document has been created with the assistance of dictation software. Please excuse typographical errors. *

## 2023-11-19 DIAGNOSIS — K5903 Drug induced constipation: Secondary | ICD-10-CM

## 2023-11-19 DIAGNOSIS — G893 Neoplasm related pain (acute) (chronic): Secondary | ICD-10-CM | POA: Diagnosis not present

## 2023-11-19 DIAGNOSIS — E440 Moderate protein-calorie malnutrition: Secondary | ICD-10-CM

## 2023-11-19 DIAGNOSIS — L89300 Pressure ulcer of unspecified buttock, unstageable: Secondary | ICD-10-CM | POA: Diagnosis not present

## 2023-11-19 DIAGNOSIS — R531 Weakness: Secondary | ICD-10-CM | POA: Diagnosis not present

## 2023-11-19 DIAGNOSIS — C189 Malignant neoplasm of colon, unspecified: Secondary | ICD-10-CM | POA: Diagnosis not present

## 2023-11-19 DIAGNOSIS — I159 Secondary hypertension, unspecified: Secondary | ICD-10-CM | POA: Diagnosis not present

## 2023-11-19 DIAGNOSIS — E876 Hypokalemia: Secondary | ICD-10-CM

## 2023-11-19 DIAGNOSIS — R7989 Other specified abnormal findings of blood chemistry: Secondary | ICD-10-CM

## 2023-11-19 LAB — CBC
HCT: 27.2 % — ABNORMAL LOW (ref 39.0–52.0)
Hemoglobin: 8.5 g/dL — ABNORMAL LOW (ref 13.0–17.0)
MCH: 27.2 pg (ref 26.0–34.0)
MCHC: 31.3 g/dL (ref 30.0–36.0)
MCV: 86.9 fL (ref 80.0–100.0)
Platelets: 168 10*3/uL (ref 150–400)
RBC: 3.13 MIL/uL — ABNORMAL LOW (ref 4.22–5.81)
RDW: 18.3 % — ABNORMAL HIGH (ref 11.5–15.5)
WBC: 13.2 10*3/uL — ABNORMAL HIGH (ref 4.0–10.5)
nRBC: 4.2 % — ABNORMAL HIGH (ref 0.0–0.2)

## 2023-11-19 LAB — COMPREHENSIVE METABOLIC PANEL
ALT: 52 U/L — ABNORMAL HIGH (ref 0–44)
AST: 126 U/L — ABNORMAL HIGH (ref 15–41)
Albumin: 2 g/dL — ABNORMAL LOW (ref 3.5–5.0)
Alkaline Phosphatase: 1013 U/L — ABNORMAL HIGH (ref 38–126)
Anion gap: 10 (ref 5–15)
BUN: 28 mg/dL — ABNORMAL HIGH (ref 6–20)
CO2: 26 mmol/L (ref 22–32)
Calcium: 8.7 mg/dL — ABNORMAL LOW (ref 8.9–10.3)
Chloride: 100 mmol/L (ref 98–111)
Creatinine, Ser: 0.55 mg/dL — ABNORMAL LOW (ref 0.61–1.24)
GFR, Estimated: >60 mL/min (ref >60)
Glucose, Bld: 114 mg/dL — ABNORMAL HIGH (ref 70–99)
Potassium: 4.4 mmol/L (ref 3.5–5.1)
Sodium: 136 mmol/L (ref 135–145)
Total Bilirubin: 0.7 mg/dL (ref 0.0–1.2)
Total Protein: 6.4 g/dL — ABNORMAL LOW (ref 6.5–8.1)

## 2023-11-19 NOTE — Progress Notes (Signed)
 PROGRESS NOTE    Joseph Haney  WNU:[id number] DOB: 1975-10-21 DOA: 11/06/2023 PCP: Frederic Jericho, [name]  Chief Complaint  Patient presents with   Shortness of Breath   Weakness    Hospital Course:  Joseph Haney is 48 y.o. 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  He was admitted on 3/7 for pain control.  Palliative care and oncology were consulted.  Patient has required high-dose opioids for adequate pain control.  Currently necessitating Dilaudid PCA with MS Contin.  At this time family would like to continue to pursue all aggressive measures and is not interested in hospice discussions.  Subjective: No acute events overnight.  Patient continues to be hopeful about physical therapy and Occupational Therapy today. He reports his pain is mildly better.  Endorses continued needs of the PCA pump.  Objective: Vitals:   11/18/23 2110 11/18/23 2342 11/19/23 0348 11/19/23 0526  BP: 129/72   126/73  Pulse: 99   92  Resp: 15 16 16 16   Temp: 98 F (36.7 C)   97.9 F (36.6 C)  TempSrc: Oral   Oral  SpO2: 100% 98% 100% 100%  Weight:      Height:        Intake/Output Summary (Last 24 hours) at 11/19/2023 0849 Last data filed at 11/19/2023 0530 Gross per 24 hour  Intake 546 ml  Output 1950 ml  Net -1404 ml   Filed Weights   11/06/23 1450 11/18/23 0500  Weight: 56.2 kg 87.2 kg    Examination: General exam: Appears calm and comfortable, frail appearing. Respiratory system: No work of breathing, symmetric chest wall expansion Cardiovascular system: S1 & S2 heard, RRR.  Gastrointestinal system: Abdomen is nondistended, soft and nontender.  Neuro: Alert and oriented. No focal neurological deficits. Extremities: Symmetric, expected ROM Skin: No rashes, lesions Psychiatry:  Mood & affect appropriate for situation.   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 Cont titration daily -- Continue decadron, beginning taper. - Continue with Celebrex - Palliative care consulted for assistance with pain management and ongoing GOC discussions - Patient remains resistant to PCA titration. 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, liver, bones - MRIs and PET scans reveal worsening of disease - Multiple GOC conversations with family and patient, at this time they are requesting continued aggressive management - Family continues to hope to pursue chemotherapy, at this time does not have enough functional reserve to pursue this - If patient gains enough strength to ambulate they will attempt to discharge and seek second opinion elsewhere - Oncology following and agrees with the above - Please see GOC conversation as documented from 3/18 note -- Cont daily  conversations  Generalized weakness - Progressive functional decline from underlying malignancy - Repeat MRIs reveal worsening metastatic spread - Essentially bedbound at home, family does report he was able to intermittently ambulate.  Multiple pressure ulcers present on arrival. -- Cont PT and OT as much as able.   Hypokalemia - Replace as needed  Left arm swelling,  resolved - Secondary to IV infiltration on 3/14.  CT forearm with diffuse edema  Drug-induced constipation - Secondary to high opioid burden -- Added aggressive bowel regimen, cont to monitor response.  Elevated LFTs - Secondary to metastasis, appear to be uptrending today. Monitor, no intervention at this time  Hypertension - cont to titrate meds as needed.  Severe protein calorie malnutrition - Worsening anorexia secondary to malignancy - Cont supplements   DVT prophylaxis: Lovenox   Code Status: Limited: Do not attempt resuscitation (DNR) -DNR-LIMITED -Do Not Intubate/DNI  Family Communication:  Discussed directly with patient and son at bedside. Disposition:  Inpatient still hospitalized for pain control, will [facility] vs hospice when he is able to wean from PCA, he remains resistant to that at this time.  Consultants:  Treatment Team:  Consulting Physician: Malachy Mood, MD  Procedures:    Antimicrobials:  Anti-infectives (From admission, onward)    None       Data Reviewed: I have personally reviewed following labs and imaging studies CBC: Recent Labs  Lab 11/16/23 0229 11/16/23 0859 11/19/23 0418  WBC 12.6*  --  13.2*  NEUTROABS 9.6*  --   --   HGB 6.8* 8.8* 8.5*  HCT 22.8* 28.3* 27.2*  MCV 88.4  --  86.9  PLT 230  --  168   Basic Metabolic Panel: Recent Labs  Lab 11/16/23 0229 11/19/23 0418  NA 138 136  K 4.8 4.4  CL 104 100  CO2 24 26  GLUCOSE 127* 114*  BUN 38* 28*  CREATININE 0.73 0.55*  CALCIUM 8.6* 8.7*  MG 2.1  --    GFR: Estimated Creatinine Clearance: 111.2 mL/min (A) (by C-G formula based on SCr of 0.55 mg/dL (L)). Liver Function Tests: Recent Labs  Lab 11/16/23 0229 11/19/23 0418  AST 86* 126*  ALT 44 52*  ALKPHOS 1,034* 1,013*  BILITOT 0.5 0.7  PROT 6.4* 6.4*  ALBUMIN 1.8* 2.0*   CBG: No results for input(s): "GLUCAP" in the last 168 hours.  No results found for this or any previous visit (from the  past 240 hours).   Radiology Studies: No results found.  Scheduled Meds:  carvedilol  25 mg Oral BID WC   Chlorhexidine Gluconate Cloth  6 each Topical Daily   dexamethasone  4 mg Intravenous Q8H   enoxaparin (LOVENOX) injection  40 mg Subcutaneous Q24H   HYDROmorphone   Intravenous Q4H   lactulose  20 g Oral BID   morphine  90 mg Oral Q8H   OLANZapine  5 mg Oral QHS   pantoprazole  40 mg Oral Daily   potassium chloride SA  20 mEq Oral Daily   Ensure Max Protein  11 oz Oral BID   senna  1 tablet Oral BID   sodium chloride flush  10-40 mL Intracatheter Q12H   Continuous Infusions:   LOS: 13 days  MDM: Patient is high risk for one or more organ failure.  They necessitate ongoing hospitalization for continued IV therapies and subsequent lab monitoring. Total time spent interpreting labs and vitals, coordinating care amongst consultants and care team members, directly assessing and discussing  care with the patient and/or family: 57 min    Debarah Crape, DO [facility]  To contact the attending physician between 7A-7P please use Epic Chat. To contact the covering physician during after hours 7P-7A, please review Amion.   11/19/2023, 8:49 AM   *This document has been created with the assistance of dictation software. Please excuse typographical errors. *

## 2023-11-19 NOTE — Progress Notes (Signed)
 Physical Therapy Treatment Patient Details Name: Joseph Haney MRN: [id number] DOB: [birth year] Today's Date: 11/19/2023   History of Present Illness Joseph Haney is a 48 y.o. male admitted with cancer associated pain. PMH: colon cancer (s/p partial colectomy 2022), liver and pelvic bone metastasis    PT Comments  PT - Cognition Comments: AxO x 3 pleasant and willing.  Mom in room visiting.  Poor insight to his condition.  Wants to "get better" but feels "very weak". Assisted OOB to [facility] was very difficult.  General bed mobility comments: pt was unable to move either LE.  Required Total Assist (helicopter)swival + 2 asisst to transfer from supine to EOB. Once upright, Pt had to support self with B UE's to prevent LOB forward/backward.  Again, Pt was unable to move either LE and use to support self sitting. General transfer comment: pt present with 0/5 quad strength.  Used STEDY with pillow at knees to stand.  Had to use a bed sheet around pt's low back to assist with forward weight shift as Pt presented with NO active assist.  Profound Paraperesis.  Assisted to [facility] as RN reported "we gave him something".  Pt sat on BSC x 7 min with no results.  "Just a lot of gas", stated Pt.  He states he can "feel" when he needs to have a BM.  Used STEDY to assist from [facility] to recliner.  Again, profound B LE weakness.  Unable to achieve upright posture even in STEDY.  Positioned in recliner with multiple pillows for comfort. Discharge plans TBD medical complexity and poor prognosis.    If plan is discharge home, recommend the following: Two people to help with walking and/or transfers;A lot of help with bathing/dressing/bathroom;Assistance with cooking/housework;Assist for transportation   Can travel by private vehicle        Equipment Recommendations       Recommendations for Other Services       Precautions / Restrictions Precautions Precautions: Fall Precaution/Restrictions Comments: Log rolled  during bed mobility for comfort Restrictions Weight Bearing Restrictions Per Provider Order: No     Mobility  Bed Mobility Overal bed mobility: Needs Assistance Bed Mobility: Supine to Sit     Supine to sit: Total assist, +2 for physical assistance, +2 for safety/equipment     General bed mobility comments: pt was unable to move either LE.  Required Total Assist (helicopter)swival + 2 asisst to transfer from supine to EOB. Once upright, Pt had to support self with B UE's to prevent LOB forward/backward.  Again, Pt was unable to move either LE and use to support self sitting.    Transfers Overall transfer level: Needs assistance   Transfers: Sit to/from Stand Sit to Stand: Total assist, +2 physical assistance, +2 safety/equipment, Via lift equipment           General transfer comment: pt present with 0/5 quad strength.  Used STEDY with pillow at knees to stand.  Had to use a bed sheet around pt's low back to assist with forward weight shift as Pt presented with NO active assist.  Profound Paraperesis.  Assisted to [facility] as RN reported "we gave him something".  Pt sat on BSC x 7 min with no results.  "Just a lot of gas", stated Pt.  He states he can "feel" when he needs to have a BM.  Used STEDY to assist from [facility] to recliner.  Again, profound B LE weakness.  Unable to achieve upright posture even in STEDY.  Positioned  in recliner with multiple pillows for comfort. Transfer via Lift Equipment: Stedy  Ambulation/Gait               General Gait Details: non amb   Building services engineer [place] (Stroke Patients Only)       Balance                                            Communication    Cognition Arousal: Alert Behavior During Therapy: WFL for tasks assessed/performed   PT - Cognitive impairments: No apparent impairments                       PT - Cognition Comments: AxO x 3 pleasant  and willing.  Mom in room visiting.  Poor insight to his condition.  Wants to "get better" but feels "very weak". Following commands: Intact      Cueing    Exercises      General Comments        Pertinent Vitals/Pain Pain Assessment Pain Assessment: Faces Faces Pain Scale: Hurts little more Pain Location: general Pain Descriptors / Indicators: Aching, Constant Pain Intervention(s): Monitored during session, Repositioned    Home Living                          Prior Function            PT Goals (current goals can now be found in the care plan section) Progress towards PT goals: Progressing toward goals    Frequency    Min 2X/week      PT Plan      Co-evaluation              AM-PAC PT "6 Clicks" Mobility   Outcome Measure  Help needed turning from your back to your side while in a flat bed without using bedrails?: Total Help needed moving from lying on your back to sitting on the side of a flat bed without using bedrails?: Total Help needed moving to and from a bed to a chair (including a wheelchair)?: Total Help needed standing up from a chair using your arms (e.g., wheelchair or bedside chair)?: Total Help needed to walk in hospital room?: Total Help needed climbing 3-5 steps with a railing? : Total 6 Click Score: 6    End of Session Equipment Utilized During Treatment: Gait belt;Other (comment) Activity Tolerance: Patient limited by fatigue Patient left: in chair;with call bell/phone within reach;with family/visitor present Nurse Communication: Mobility status;Need for lift equipment       Time: 1445-1510 PT Time Calculation (min) (ACUTE ONLY): 25 min  Charges:    $Therapeutic Activity: 23-37 mins PT General Charges $$ ACUTE PT VISIT: 1 Visit                     Felecia Shelling  PTA Acute  Rehabilitation Services Office M-F          [phone]

## 2023-11-19 NOTE — Progress Notes (Signed)
 Daily Progress Note   Patient Name: Joseph Haney       Date: 11/19/2023 DOB: September 16, 1975  Age: 48 y.o. MRN#: [id number] Attending Physician: Joseph Crape, DO Primary Care Physician: Joseph Jericho, [name] Admit Date: 11/06/2023 Length of Stay: 13 days  Reason for Consultation/Follow-up: Establishing goals of care, Non pain symptom management, and Pain control  Subjective:   CC: Following up today regarding continued discussions about planning.  Palliative medicine team following up regarding symptom management.  Subjective:  Reviewed EMR prior to presenting to bedside.  Remains on Dilaudid PCA.   Reviewed with patient and Joseph Haney again about his current condition and the overall plan of care.  Patient believes that he is trying his hardest to improve his functional status.  He believes that he is pushing himself to gain strength.  Ongoing daily repeated discussions with the patient and family present at bedside about the serious incurable nature of the patient's condition.  Continuing to work on establishing trust relationship with the patient and also continuing symptom management as well as broad goals of care discussions.   Continues to require PCA.  Remains on Dilaudid PCA 0.75 mg available every 10 minutes bolus.    Spent time answering questions as able.  Noted palliative medicine team continue to follow along and engage in conversations as able.   Objective:   Vital Signs:  BP 115/69 (BP Location: Left Arm)   Pulse (!) 102   Temp 98.3 F (36.8 C) (Oral)   Resp 18   Ht 5\' 4"  (1.626 m)   Wt 87.2 kg   SpO2 98%   BMI 33.01 kg/m   Physical Exam: General: NAD, alert, cachectic, chronically ill-appearing Cardiovascular: RRR Respiratory: no increased work of breathing noted, not in respiratory distress Neuro: A&Ox4, following commands easily Psych: appropriately answers all questions  Imaging:  I personally reviewed recent imaging.   Assessment & Plan:    Assessment: Patient is a 48 year old male with a past medical history of metastatic colon cancer with metastatic disease to lungs, bone, and liver who was admitted on 11/06/2023 for uncontrolled cancer associated pain.  Recent imaging has shown progression of the disease and patient's bone.  Oncology consulted for recommendations.  Palliative medicine team consulted to assist with symptom management.  Recommendations/Plan: # Complex medical decision making/goals of care:  - Discussed care with patient as detailed above in HPI.  Have continued to have multiple lengthy conversations about patient's medical status, prognosis, not being a candidate for further cancer directed therapy based on his poor functional and nutritional status, and discussed possible support moving forward through home hospice.  Patient continues to struggle with processing this information and continues to focus on getting "better".  Palliative medicine team continuing to follow along to engage in conversations as able.  -  Code Status: Limited: Do not attempt resuscitation (DNR) -DNR-LIMITED -Do Not Intubate/DNI   # Symptom management: Patient is receiving these palliative interventions for symptom management with an intent to improve quality of life.   -Pain, severe in setting of metastatic colon cancer with mets to bone, liver, and lung Remains on IV Dilaudid PCA  also on MS Contin   Also on IV steroids and NSAID are assisting with edema management.   -Continue MS Contin to 90 mg every 8 hours.     -Continue PCA with IV Dilaudid 0.75 mg every 10 minutes bolus.  No continuous infusion as on long-acting oral opioids.   -Decrease IV dexamethasone 4 mg 8 hours.  Continue to titrate medication down as able.   -Continue Celebrex 200 mg twice daily for total of 10 doses.   Patient receiving Protonix 40 mg daily.   -Consider use of bisphosphonate    -Agree with radiation course to assist with pain management as unfortunately  opioids have difficulty managing bone related cancer pain without requiring adverse effects such as sedation to obtain pain management Patient has finished radiation last dose was on 11-18-2023.  Patient states that he has been told that he will see what the effects of radiation have been over the course of the next 1 to 2 weeks.  He believes that he ought to remain in the hospital for observation.  Does not want to be weaned off of PCA.    -Mood/appetite   -Personally reviewed EKG which noted QTc on 11/09/2023 to be 432   -Continue olanzapine to 5 mg nightly   -Constipation   -Continue senna 1 tab twice daily  # Psychosocial Support:  -Joseph Haney, Joseph Haney, Joseph Haney  -Chaplain following for support  # Discharge Planning: To Be Determined Patient continues to endorse that he does not want to feel " rushed out of the hospital".  Discussed with: Patient, patient's Joseph Haney,  Thank you for allowing the palliative care team to participate in the care Joseph Haney.  High MDM Joseph Hawking MD Palliative Care Provider PMT # [phone]  If patient remains symptomatic despite maximum doses, please call PMT at [phone] between 0700 and 1900. Outside of these hours, please call attending, as PMT does not have night coverage.

## 2023-11-19 NOTE — Plan of Care (Signed)

## 2023-11-19 NOTE — Radiation Completion Notes (Addendum)
  Radiation Oncology         [phone] ________________________________  Name: Joseph Haney MRN: [id number]  Date: 11/18/2023  DOB: Sep 26, 1975  Referring Physician: Sonja Canaseraga, M.D. Date of Service: 2023-11-19 Radiation Oncologist: Bartholome Ligas, M.D. [facility]     RADIATION ONCOLOGY END OF TREATMENT NOTE     Diagnosis: 48 y.o. 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  He tolerated the radiation treatments well with only modest fatigue.  The patient will receive a call in about one month from the radiation oncology department. He will continue follow up with his medical oncologist, Dr. Maryalice Smaller, as well.  ------------------------------------------------   Kenith Payer, MD [facility]  Radiation Oncology Direct Dial: [phone]  Fax: [phone] [email or web address]  Skype  LinkedIn

## 2023-11-20 ENCOUNTER — Other Ambulatory Visit: Payer: Self-pay

## 2023-11-20 DIAGNOSIS — G893 Neoplasm related pain (acute) (chronic): Secondary | ICD-10-CM | POA: Diagnosis not present

## 2023-11-20 MED ORDER — COLLAGENASE 250 UNIT/GM EX OINT
TOPICAL_OINTMENT | Freq: Every day | CUTANEOUS | Status: AC
  Administered 2023-11-22 – 2023-11-23 (×2): 1 via TOPICAL
  Filled 2023-11-20 (×2): qty 30

## 2023-11-20 NOTE — TOC Progression Note (Signed)
 Transition of Care [facility]) - Progression Note   Patient Details  Name: Joseph Haney MRN: [id number] Date of Birth: 17-Dec-1975  [facility] CM/SW Contact  Ewing Schlein, LCSW Phone Number: 11/20/2023, 10:06 AM  Clinical Narrative: TOC to follow for discharge needs.  Barriers to Discharge: Continued Medical Work up  Social Determinants of Health (SDOH) Interventions SDOH Screenings   Food Insecurity: No Food Insecurity (11/07/2023)  Housing: High Risk (11/07/2023)  Transportation Needs: Unmet Transportation Needs (11/07/2023)  Utilities: Not At Risk (11/07/2023)  Social Connections: Unknown (11/07/2023)  Tobacco Use: Low Risk  (11/08/2023)   Readmission Risk Interventions    11/09/2023    9:44 AM 11/07/2023    2:14 PM  Readmission Risk Prevention Plan  Post Dischage Appt  Complete  Medication Screening  Complete  Transportation Screening Complete Complete  HRI or Home Care Consult Complete   Social Work Consult for Recovery Care Planning/Counseling Complete   Palliative Care Screening Not Applicable   Medication Review Ocean[employer]) Complete

## 2023-11-20 NOTE — Plan of Care (Signed)

## 2023-11-20 NOTE — Progress Notes (Signed)
 PROGRESS NOTE    Brandy Kabat  XBM:[id number] DOB: [birth year] DOA: 11/06/2023 PCP: Frederic Jericho, [name]  Chief Complaint  Patient presents with   Shortness of Breath   Weakness    Hospital Course:  Joseph Haney is 48 y.o. 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  He was admitted on 3/7 for pain control.  Palliative care and oncology were consulted.  Patient has required high-dose opioids for adequate pain control.  Currently necessitating Dilaudid PCA with MS Contin.  At this time family would like to continue to pursue all aggressive measures and is not interested in hospice discussions.  Subjective: No acute events overnight.  Today we discussed his decubitus ulcers and how they are unlikely to heal given his poor nutritional status, and inability to offload and rotate on his own.  He would like to continue treating these as aggressively as possible.  He is aware this might be painful and he is already receiving very high doses of opioids.  Objective: Vitals:   11/19/23 2015 11/19/23 2301 11/20/23 0414 11/20/23 0507  BP: 118/75  115/72   Pulse: 84  79   Resp: 18 18 16 16   Temp: 98.3 F (36.8 C)  98.1 F (36.7 C)   TempSrc:      SpO2: 99% 97% 99% 100%  Weight:      Height:        Intake/Output Summary (Last 24 hours) at 11/20/2023 0849 Last data filed at 11/19/2023 2302 Gross per 24 hour  Intake 407 ml  Output 1175 ml  Net -768 ml   Filed Weights   11/06/23 1450 11/18/23 0500  Weight: 56.2 kg 87.2 kg    Examination: General exam: Appears calm and comfortable, frail appearing. Respiratory system: No work of breathing, symmetric chest wall expansion Cardiovascular system: S1 & S2 heard, RRR.  Gastrointestinal system: Abdomen is nondistended, soft and nontender.  Neuro: Alert and oriented. No focal neurological  deficits. Extremities: Symmetric, expected ROM Skin: No rashes, lesions Psychiatry:  Mood & affect appropriate for situation.   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 [facility])   Need for emotional support   Medication management   Palliative care encounter   Appetite loss   HTN (hypertension)   Pressure injury of skin   Elevated LFTs   Counseling and coordination of care   Drug-induced constipation    Cancer associated pain - Currently on Dilaudid PCA with MS Contin being continuously adjusted. Cont titration daily -- Continue decadron, beginning taper. - Continue with Celebrex - Palliative care consulted for assistance with pain management and ongoing GOC discussions - Patient remains resistant to PCA titration. 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, liver, bones - MRIs and PET scans reveal worsening of disease - Multiple GOC conversations with family and patient, at this time they are requesting continued aggressive management - Family continues to hope to pursue chemotherapy, at this time does not have enough functional reserve to pursue this - If patient gains enough strength to ambulate they  will attempt to discharge and seek second opinion elsewhere - Oncology following and agrees with the above - Please see GOC conversation as documented from 3/18 note -- Continuing daily conversations  Multiple unstageable decubitus ulcers - Wound care was consulted.  Has been proceeding conservatively however patient is requesting full aggressive care - Will add Santyl for now, patient is unlikely to  tolerate debridement given his inability to turn or participate with PT - This further complicates his care. - Have discussed with him at bedside.  Generalized weakness - Progressive functional decline from underlying malignancy - Repeat MRIs reveal worsening metastatic spread - Essentially bedbound at home, family does report he was able to intermittently ambulate.  Multiple pressure ulcers present on arrival. -- cont PT and OT as much as able.   Hypokalemia - Replace as needed  Left arm swelling, resolved - Secondary to IV infiltration on 3/14.  CT forearm with diffuse edema  Drug-induced constipation - Secondary to high opioid burden -- Added aggressive bowel regimen, cont to monitor response.  Elevated LFTs - Secondary to metastasis, appear to be uptrending today. Monitor, no intervention at this time  Hypertension - cont to titrate meds as needed.  Severe protein calorie malnutrition - Worsening anorexia secondary to malignancy - Continue supplements   DVT prophylaxis: Lovenox   Code Status: Limited: Do not attempt resuscitation (DNR) -DNR-LIMITED -Do Not Intubate/DNI  Family Communication:  Discussed directly with patient and son at bedside. Disposition:  Inpatient still hospitalized for pain control, will discharge to home with [facility] vs hospice when he is able to wean from PCA, he remains resistant to that at this time.  Consultants:  Treatment Team:  Consulting Physician: Malachy Mood, MD  Procedures:    Antimicrobials:  Anti-infectives (From admission, onward)    None       Data Reviewed: I have personally reviewed following labs and imaging studies CBC: Recent Labs  Lab 11/16/23 0229 11/16/23 0859 11/19/23 0418  WBC 12.6*  --  13.2*  NEUTROABS 9.6*  --   --   HGB 6.8* 8.8* 8.5*  HCT 22.8* 28.3* 27.2*  MCV 88.4  --  86.9  PLT 230  --  168   Basic Metabolic Panel: Recent Labs  Lab 11/16/23 0229 11/19/23 0418  NA 138 136  K 4.8 4.4  CL 104 100   CO2 24 26  GLUCOSE 127* 114*  BUN 38* 28*  CREATININE 0.73 0.55*  CALCIUM 8.6* 8.7*  MG 2.1  --    GFR: Estimated Creatinine Clearance: 111.2 mL/min (A) (by C-G formula based on SCr of 0.55 mg/dL (L)). Liver Function Tests: Recent Labs  Lab 11/16/23 0229 11/19/23 0418  AST 86* 126*  ALT 44 52*  ALKPHOS 1,034* 1,013*  BILITOT 0.5 0.7  PROT 6.4* 6.4*  ALBUMIN 1.8* 2.0*   CBG: No results for input(s): "GLUCAP" in the last 168 hours.  No results found for this or any previous visit (from the past 240 hours).   Radiology Studies: No results found.  Scheduled Meds:  carvedilol  25 mg Oral BID WC   Chlorhexidine Gluconate Cloth  6 each Topical Daily   collagenase   Topical Daily   dexamethasone  4 mg Intravenous Q8H   enoxaparin (LOVENOX) injection  40 mg Subcutaneous Q24H   HYDROmorphone   Intravenous Q4H   lactulose  20 g Oral BID   morphine  90 mg Oral Q8H   OLANZapine  5 mg Oral QHS   pantoprazole  40 mg  Oral Daily   potassium chloride SA  20 mEq Oral Daily   Ensure Max Protein  11 oz Oral BID   senna  1 tablet Oral BID   sodium chloride flush  10-40 mL Intracatheter Q12H   Continuous Infusions:   LOS: 14 days  MDM: Patient is high risk for one or more organ failure.  They necessitate ongoing hospitalization for continued IV therapies and subsequent lab monitoring. Total time spent interpreting labs and vitals, coordinating care amongst consultants and care team members, directly assessing and discussing care with the patient and/or family: 55 min    Debarah Crape, DO [facility]  To contact the attending physician between 7A-7P please use Epic Chat. To contact the covering physician during after hours 7P-7A, please review Amion.   11/20/2023, 8:49 AM   *This document has been created with the assistance of dictation software. Please excuse typographical errors. *

## 2023-11-20 NOTE — Progress Notes (Addendum)
 Occupational Therapy Treatment Patient Details Name: Joseph Haney MRN: [id number] DOB: 05/02/1976 Today's Date: 11/20/2023   History of present illness Joseph Haney is a 48 y.o. male admitted with cancer associated pain. PMH: colon cancer (s/p partial colectomy 2022), liver and pelvic bone metastasis   OT comments  Patient was seen for skilled OT this afternoon. NT present upon arrival and OT and NT offered pt for OOB via [facility] lift but patient reported he was "wiped out" from yesterday. OT discussed goals and assessed basic ADL's and bed mobility, issued and trained in simple UE therex for carryover between visits if no pain. Patient aware to stop use of band or foam ball if increased pain. Patient presents with significant pain, weakness, balance, and activity tolerance impacting BADL's and mobility. As per chart review, palliative consults are ongoing. Based on this visit and collaboration with patient and his goals,with home health services, all DME, 24 hr assist and support from family if that is possible, patient reports he would prefer to go home whereas OT recommend continued inpatient follow up therapy, <3 hours/day based on patient's goals and high level of need if he were to go home post acute. Patient will require continued Acute OT services while in hospital setting to maximize function.        If plan is discharge home, recommend the following:  Two people to help with walking and/or transfers;Two people to help with bathing/dressing/bathroom;Direct supervision/assist for medications management;Assist for transportation;Direct supervision/assist for financial management;Help with stairs or ramp for entrance   Equipment Recommendations  BSC/3in1;Tub/shower bench;Hospital bed;Hoyer lift (if patient were to go home versus snf)       Precautions / Restrictions Precautions Precautions: Fall Recall of Precautions/Restrictions: Intact Restrictions Weight Bearing Restrictions Per  Provider Order: No       Mobility Bed Mobility Overal bed mobility: Needs Assistance Bed Mobility: Supine to Sit Rolling: Mod assist Sidelying to sit: Mod assist, HOB elevated, Used rails Supine to sit: Total assist, +2 for physical assistance, +2 for safety/equipment Sit to supine: Max assist   General bed mobility comments: poor LE recruitment for bed mobility    Transfers  Offered with NT to use [facility] OOB but patient deferred due to report he was sore from PT yesterday                        Balance Overall balance assessment: Needs assistance Sitting-balance support: Feet supported, Single extremity supported Sitting balance-Leahy Scale: Fair                                     ADL either performed or assessed with clinical judgement   ADL Overall ADL's : Needs assistance/impaired Eating/Feeding: Set up;Bed level   Grooming: Wash/dry hands;Wash/dry face;Minimal assistance;Sitting Grooming Details (indicate cue type and reason): EOB, impaired balance                               General ADL Comments: significant effort for bed mobility this session    Extremity/Trunk Assessment Upper Extremity Assessment Upper Extremity Assessment: Generalized weakness                     Communication Communication Communication: No apparent difficulties   Cognition Arousal: Alert Behavior During Therapy: WFL for tasks assessed/performed Cognition: No apparent impairments  Following commands: Intact        Cueing   Cueing Techniques: Verbal cues  Exercises Exercises: General Upper Extremity (B yellow tband 3 sets of 10 reps for triceps press, issued for HEP, B hand foam grasp ball for general cdp management and hand strength issued, all rec for TID and stop if pain exacerbation)    Shoulder Instructions       General Comments RA with O2 between 94-96% post therex    Pertinent  Vitals/ Pain       Pain Assessment Pain Assessment: Faces Faces Pain Scale: Hurts even more Pain Location: "whole body" Pain Descriptors / Indicators: Aching, Constant Pain Intervention(s): Repositioned, Utilized relaxation techniques, Monitored during session, Premedicated before session   Frequency  Min 2X/week        Progress Toward Goals  OT Goals(current goals can now be found in the care plan section)  Progress towards OT goals: Progressing toward goals  Acute Rehab OT Goals Patient Stated Goal: to go home OT Goal Formulation: With patient Time For Goal Achievement: 11/25/23 Potential to Achieve Goals: Good ADL Goals Pt Will Perform Grooming: with mod assist;sitting Pt Will Perform Upper Body Bathing: with mod assist;sitting Pt Will Transfer to Toilet: with max assist;bedside commode Additional ADL Goal #1: Pt will transition from supine to sitting EOB with Max A in order to participate in self care task while seted EOB and progress OOB activity.  Plan         AM-PAC OT "6 Clicks" Daily Activity     Outcome Measure   Help from another person eating meals?: A Little Help from another person taking care of personal grooming?: A Little Help from another person toileting, which includes using toliet, bedpan, or urinal?: Total Help from another person bathing (including washing, rinsing, drying)?: A Lot Help from another person to put on and taking off regular upper body clothing?: A Little Help from another person to put on and taking off regular lower body clothing?: Total 6 Click Score: 13    End of Session Equipment Utilized During Treatment: Gait belt  OT Visit Diagnosis: Unsteadiness on feet (R26.81);Muscle weakness (generalized) (M62.81);Pain   Activity Tolerance Patient limited by fatigue;Patient limited by pain   Patient Left in bed;with call bell/phone within reach;with nursing/sitter in room   Nurse Communication Mobility status        Time:  1610-9604 OT Time Calculation (min): 37 min  Charges: OT General Charges $OT Visit: 1 Visit OT Treatments $Therapeutic Activity: 23-37 mins    Desira Alessandrini OT/L Acute Rehabilitation Department  [phone]  11/20/2023, 4:01 PM

## 2023-11-20 NOTE — Progress Notes (Signed)
 Daily Progress Note   Patient Name: Joseph Haney       Date: 11/20/2023 DOB: 1976-07-24  Age: 48 y.o. MRN#: [id number] Attending Physician: Debarah Crape, DO Primary Care Physician: Frederic Jericho, [name] Admit Date: 11/06/2023 Length of Stay: 14 days  Reason for Consultation/Follow-up: Establishing goals of care, Non pain symptom management, and Pain control  Subjective:   CC: Following up today regarding continued discussions about planning.  Palliative medicine team following up regarding symptom management.  Subjective:  Reviewed EMR prior to presenting to bedside.  Remains on Dilaudid PCA.   Reviewed with patient and son again about his current condition and the overall plan of care.  Patient wishes to continue current mode of care.    Continues to require PCA.  Remains on Dilaudid PCA 0.75 mg available every 10 minutes bolus.    Spent time answering questions as able.  Noted palliative medicine team continue to follow along and engage in conversations as able.   Objective:   Vital Signs:  BP 123/68   Pulse 84   Temp 98.8 F (37.1 C) (Oral)   Resp 19   Ht 5\' 4"  (1.626 m)   Wt 87.2 kg   SpO2 97%   BMI 33.01 kg/m   Physical Exam: General: NAD, alert, cachectic, chronically ill-appearing Cardiovascular: RRR Respiratory: no increased work of breathing noted, not in respiratory distress Neuro: A&Ox4, following commands easily Psych: appropriately answers all questions  Imaging:  I personally reviewed recent imaging.   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  Recent imaging has shown progression of the disease and patient's bone.  Oncology consulted for recommendations.  Palliative medicine team consulted to assist with symptom  management.  Recommendations/Plan: # Complex medical decision making/goals of care:  - Discussed care with patient as detailed above in HPI.  Have continued to have multiple lengthy conversations about patient's medical status, prognosis, not being a candidate for further cancer directed therapy based on his poor functional and nutritional status, and discussed possible support moving forward through home hospice.  Patient continues to struggle with processing this information and continues to focus on getting "better".  Palliative medicine team continuing to follow along to engage in conversations as able.  -  Code Status: Limited: Do not attempt resuscitation (DNR) -DNR-LIMITED -Do Not Intubate/DNI   # Symptom management: Patient is receiving these palliative interventions for symptom management with an intent to improve quality of life.   -Pain, severe in setting of metastatic colon cancer with mets to bone, liver, and lung Remains on IV Dilaudid PCA  also on MS Contin   Also on IV steroids and NSAID are assisting with edema management.   -Continue MS Contin to 90 mg every 8 hours.     -Continue PCA with IV Dilaudid 0.75 mg every 10 minutes bolus.  No continuous infusion as on long-acting oral opioids.   -Decrease IV dexamethasone 4 mg 8 hours.  Continue to titrate medication down as able.   -Continue Celebrex 200 mg twice daily for total of 10 doses.   Patient receiving Protonix 40 mg daily.   -Consider use of bisphosphonate    -Agree with radiation course to assist with pain management as unfortunately opioids have difficulty managing bone related cancer pain without requiring adverse effects such as sedation to obtain pain  management Patient has finished radiation last dose was on 11-18-2023.  Patient states that he has been told that he will see what the effects of radiation have been over the course of the next 1 to 2 weeks.  He believes that he ought to remain in the hospital for  observation.  Does not want to be weaned off of PCA.    -Mood/appetite   -Personally reviewed EKG which noted QTc on 11/09/2023 to be 432   -Continue olanzapine to 5 mg nightly   -Constipation   -Continue senna 1 tab twice daily  # Psychosocial Support:  -Brother, mother, son  -Chaplain following for support  # Discharge Planning: To Be Determined Patient continues to endorse that he does not want to feel " rushed out of the hospital".  Discussed with: Patient  Thank you for allowing the palliative care team to participate in the care [name].  mod MDM Rosalin Hawking MD Palliative Care Provider PMT # [phone]  If patient remains symptomatic despite maximum doses, please call PMT at [phone] between 0700 and 1900. Outside of these hours, please call attending, as PMT does not have night coverage.

## 2023-11-21 DIAGNOSIS — G893 Neoplasm related pain (acute) (chronic): Secondary | ICD-10-CM | POA: Diagnosis not present

## 2023-11-21 MED ORDER — POTASSIUM CHLORIDE 20 MEQ PO PACK
20.0000 meq | PACK | Freq: Every day | ORAL | Status: DC
  Administered 2023-11-21 – 2023-12-03 (×13): 20 meq via ORAL
  Filled 2023-11-21 (×13): qty 1

## 2023-11-21 NOTE — Progress Notes (Signed)
 Daily Progress Note   Patient Name: Joseph Haney       Date: 11/21/2023 DOB: 11-19-1975  Age: 48 y.o. MRN#: [id number] Attending Physician: Debarah Crape, DO Primary Care Physician: Frederic Jericho, [name] Admit Date: 11/06/2023 Length of Stay: 15 days  Reason for Consultation/Follow-up: Establishing goals of care, Non pain symptom management, and Pain control  Subjective:   CC: Following up today regarding continued discussions about planning.  Palliative medicine team following up regarding symptom management.  Subjective:  Reviewed EMR prior to presenting to bedside.  Remains on Dilaudid PCA.    Spent time answering questions as able.  Noted palliative medicine team continue to follow along and engage in conversations as able.   Objective:   Vital Signs:  BP 106/67 (BP Location: Right Arm)   Pulse 88   Temp 98.6 F (37 C)   Resp 18   Ht 5\' 4"  (1.626 m)   Wt 87.2 kg   SpO2 98%   BMI 33.01 kg/m   Physical Exam: General: NAD, alert, cachectic, chronically ill-appearing Cardiovascular: RRR Respiratory: no increased work of breathing noted, not in respiratory distress Neuro: A&Ox4, following commands easily Psych: appropriately answers all questions  Imaging:  I personally reviewed recent imaging.   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  Recent imaging has shown progression of the disease and patient's bone.  Oncology consulted for recommendations.  Palliative medicine team consulted to assist with symptom management.  Recommendations/Plan: # Complex medical decision making/goals of care:  - Discussed care with patient as detailed above in HPI.  Have continued to have multiple lengthy conversations about patient's medical status, prognosis, not being a  candidate for further cancer directed therapy based on his poor functional and nutritional status, and discussed possible support moving forward through home hospice.  Patient continues to struggle with processing this information and continues to focus on getting "better".  Palliative medicine team continuing to follow along to engage in conversations as able.  -  Code Status: Limited: Do not attempt resuscitation (DNR) -DNR-LIMITED -Do Not Intubate/DNI   # Symptom management: Patient is receiving these palliative interventions for symptom management with an intent to improve quality of life.   -Pain, severe in setting of metastatic colon cancer with mets to bone, liver, and lung Remains on IV Dilaudid PCA  also on MS Contin   Also on IV steroids and NSAID are assisting with edema management.   -Continue MS Contin to 90 mg every 8 hours.     -Continue PCA with IV Dilaudid 0.75 mg every 10 minutes bolus.  No continuous infusion as on long-acting oral opioids.   -Decrease IV dexamethasone 4 mg 8 hours.  Continue to titrate medication down as able.   -Continue Celebrex 200 mg twice daily for total of 10 doses.   Patient receiving Protonix 40 mg daily.           -Mood/appetite   -Personally reviewed EKG which noted QTc on 11/09/2023 to be 432   -Continue olanzapine to 5 mg nightly   -Constipation   -Continue senna 1 tab twice daily  # Psychosocial Support:  -Brother, mother, son  -Chaplain following for support  # Discharge Planning: To Be Determined   Discussed with: Patient  Thank you for allowing the palliative care team to participate in the  care [name].  Low MDM Rosalin Hawking MD Palliative Care Provider PMT # [phone]  If patient remains symptomatic despite maximum doses, please call PMT at [phone] between 0700 and 1900. Outside of these hours, please call attending, as PMT does not have night coverage.

## 2023-11-21 NOTE — Plan of Care (Signed)
   Problem: Coping: Goal: Level of anxiety will decrease Outcome: Progressing

## 2023-11-21 NOTE — Progress Notes (Signed)
 PROGRESS NOTE    Joseph Haney  ZOX:[id number] DOB: 29-May-1976 DOA: 11/06/2023 PCP: Frederic Jericho, [name]  Chief Complaint  Patient presents with   Shortness of Breath   Weakness    Hospital Course:  Joseph Haney is 48 y.o. 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  He was admitted on 3/7 for pain control.  Palliative care and oncology were consulted.  Patient has required high-dose opioids for adequate pain control.  Currently necessitating Dilaudid PCA with MS Contin.  At this time family would like to continue to pursue all aggressive measures and is not interested in hospice discussions.  Subjective: No acute events overnight.  Continues to endorse hope that he will get stronger today.  No acute issues.  Objective: Vitals:   11/20/23 2243 11/21/23 0032 11/21/23 0416 11/21/23 0611  BP: 120/67   109/66  Pulse: 97   74  Resp: 18 18 17 18   Temp: 98.3 F (36.8 C)   98.6 F (37 C)  TempSrc:      SpO2: 98% 96% 96% 99%  Weight:      Height:        Intake/Output Summary (Last 24 hours) at 11/21/2023 0901 Last data filed at 11/21/2023 0015 Gross per 24 hour  Intake 42.83 ml  Output 975 ml  Net -932.17 ml   Filed Weights   11/06/23 1450 11/18/23 0500  Weight: 56.2 kg 87.2 kg    Examination: General exam: Appears calm and comfortable, frail appearing. Respiratory system: No work of breathing, symmetric chest wall expansion Cardiovascular system: S1 & S2 heard, RRR.  Gastrointestinal system: Abdomen is nondistended, soft and nontender.  Neuro: Alert and oriented. No focal neurological deficits. Extremities: Symmetric, expected ROM Skin: No rashes, lesions Psychiatry:  Mood & affect appropriate for situation.   Assessment & Plan:  Principal Problem:   Cancer associated pain Active Problems:   Stage IV carcinoma of colon (HCC)    Goals of care, counseling/discussion   Generalized weakness   Hypokalemia   Protein-calorie malnutrition, severe (HCC)   Malnutrition of moderate degree   High risk medication use   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 [facility])   Need for emotional support   Medication management   Palliative care encounter   Appetite loss   HTN (hypertension)   Pressure injury of skin   Elevated LFTs   Counseling and coordination of care   Drug-induced constipation    Cancer associated pain - Currently on Dilaudid PCA with MS Contin.  Continue daily titration. -- On Decadron, continue taper. - Continue with Celebrex - Palliative care consulted for assistance with pain management and ongoing GOC discussions - Patient remains resistant to PCA decrease 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, liver, bones - MRIs and PET scans reveal worsening of disease - Multiple GOC conversations with family and patient, at this time they are requesting continued aggressive management - Family continues to hope to pursue chemotherapy, at this time does not have enough functional reserve to pursue this - If patient gains enough strength to ambulate they will attempt to discharge and seek second opinion elsewhere - Oncology following and agrees with the above - Please see GOC conversation as documented from 3/18 note -- Continue to have these conversations daily.  Multiple unstageable decubitus ulcers - Wound care was consulted.  Has been proceeding conservatively however patient is requesting full aggressive care - Have added Santyl for now, patient is unlikely to tolerate debridement given his inability to turn or participate with PT - This further complicates his care. - Cont to discuss  Generalized weakness - Progressive functional decline from underlying malignancy - Repeat MRIs reveal worsening metastatic spread - Essentially  bedbound at home, family does report he was able to intermittently ambulate.  Multiple pressure ulcers present on arrival. -- Continue PT/OT as much as tolerated.  Hypokalemia - Replace as needed  Left arm swelling, resolved - Secondary to IV infiltration on 3/14.  CT forearm with diffuse edema  Drug-induced constipation - Secondary to high opioid burden -- Added aggressive bowel regimen, cont to monitor response.  Elevated LFTs - Secondary to metastasis, continue to monitor on labs.  No acute intervention warranted at this time.  Hypertension - Continue to monitor daily.  Titrate medications as needed  Severe protein calorie malnutrition - Worsening anorexia secondary to malignancy - Continue supplements   DVT prophylaxis: Lovenox   Code Status: Limited: Do not attempt resuscitation (DNR) -DNR-LIMITED -Do Not Intubate/DNI  Family Communication:  Discussed directly with patient and son at bedside. Disposition:  Inpatient still hospitalized for pain control, will discharge to home with [facility] vs hospice when he is able to wean from PCA, he remains resistant to that at this time.  Consultants:  Treatment Team:  Consulting Physician: Malachy Mood, MD  Procedures:    Antimicrobials:  Anti-infectives (From admission, onward)    None       Data Reviewed: I have personally reviewed following labs and imaging studies CBC: Recent Labs  Lab 11/16/23 0229 11/16/23 0859 11/19/23 0418  WBC 12.6*  --  13.2*  NEUTROABS 9.6*  --   --   HGB 6.8* 8.8* 8.5*  HCT 22.8* 28.3* 27.2*  MCV 88.4  --  86.9  PLT 230  --  168   Basic Metabolic Panel: Recent Labs  Lab 11/16/23 0229 11/19/23 0418  NA 138 136  K 4.8 4.4  CL 104 100  CO2 24 26  GLUCOSE 127* 114*  BUN 38* 28*  CREATININE 0.73 0.55*  CALCIUM 8.6* 8.7*  MG 2.1  --    GFR: Estimated Creatinine Clearance: 111.2 mL/min (A) (by C-G formula based on SCr of 0.55 mg/dL (L)). Liver Function Tests: Recent Labs  Lab  11/16/23 0229 11/19/23 0418  AST 86* 126*  ALT 44 52*  ALKPHOS 1,034* 1,013*  BILITOT 0.5 0.7  PROT 6.4* 6.4*  ALBUMIN 1.8* 2.0*   CBG: No results for input(s): "GLUCAP" in the last 168 hours.  No results found for this or any previous visit (from the past 240 hours).   Radiology Studies: No results found.  Scheduled Meds:  carvedilol  25 mg Oral BID WC   Chlorhexidine Gluconate Cloth  6 each Topical Daily   collagenase   Topical Daily   dexamethasone  4 mg Intravenous Q8H   enoxaparin (LOVENOX) injection  40 mg Subcutaneous Q24H   HYDROmorphone   Intravenous Q4H   lactulose  20 g Oral BID   morphine  90 mg Oral Q8H   OLANZapine  5 mg Oral QHS   pantoprazole  40 mg Oral Daily   potassium chloride SA  20 mEq Oral Daily   Ensure Max Protein  11 oz Oral BID   senna  1 tablet Oral BID   sodium chloride flush  10-40 mL Intracatheter Q12H   Continuous  Infusions:   LOS: 15 days  MDM: Patient is high risk for one or more organ failure.  They necessitate ongoing hospitalization for continued IV therapies and subsequent lab monitoring. Total time spent interpreting labs and vitals, coordinating care amongst consultants and care team members, directly assessing and discussing care with the patient and/or family: 55 min    Debarah Crape, DO [facility]  To contact the attending physician between 7A-7P please use Epic Chat. To contact the covering physician during after hours 7P-7A, please review Amion.   11/21/2023, 9:01 AM   *This document has been created with the assistance of dictation software. Please excuse typographical errors. *

## 2023-11-21 NOTE — Plan of Care (Signed)
   Problem: Education: Goal: Knowledge of General Education information will improve Description Including pain rating scale, medication(s)/side effects and non-pharmacologic comfort measures Outcome: Progressing   Problem: Health Behavior/Discharge Planning: Goal: Ability to manage health-related needs will improve Outcome: Progressing

## 2023-11-22 DIAGNOSIS — G893 Neoplasm related pain (acute) (chronic): Secondary | ICD-10-CM | POA: Diagnosis not present

## 2023-11-22 NOTE — Progress Notes (Signed)
 Daily Progress Note   Patient Name: Joseph Haney       Date: 11/22/2023 DOB: [birth year]  Age: 48 y.o. MRN#: [id number] Attending Physician: Joseph Crape, DO Primary Care Physician: Joseph Jericho, [name] Admit Date: 11/06/2023 Length of Stay: 16 days  Reason for Consultation/Follow-up: Establishing goals of care, Non pain symptom management, and Pain control  Subjective:   CC: Following up today regarding continued discussions about planning.  Palliative medicine team following up regarding symptom management.  Subjective:  Reviewed EMR prior to presenting to bedside.  Remains on Dilaudid PCA.    Spent time answering questions as able.  Noted palliative medicine team continue to follow along and engage in conversations as able.  Mother at bedside today, re discussed goals of care.   Objective:   Vital Signs:  BP 113/76 (BP Location: Right Arm)   Pulse 88   Temp 98.1 F (36.7 C)   Resp 18   Ht 5\' 4"  (1.626 m)   Wt 87.2 kg   SpO2 96%   BMI 33.01 kg/m   Physical Exam: General: NAD, alert, cachectic, chronically ill-appearing Cardiovascular: RRR Respiratory: no increased work of breathing noted, not in respiratory distress Neuro: A&Ox4, following commands easily Psych: appropriately answers all questions  Imaging:  I personally reviewed recent imaging.   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  Recent imaging has shown progression of the disease and patient's bone.  Oncology consulted for recommendations.  Palliative medicine team consulted to assist with symptom management.  Recommendations/Plan: # Complex medical decision making/goals of care:  - Discussed care with patient as detailed above in HPI.  Have continued to have multiple lengthy conversations about  patient's medical status, prognosis, not being a candidate for further cancer directed therapy based on his poor functional and nutritional status, and discussed possible support moving forward through home hospice.  Patient continues to struggle with processing this information and continues to focus on getting "better".  Palliative medicine team continuing to follow along to engage in conversations as able.  -  Code Status: Limited: Do not attempt resuscitation (DNR) -DNR-LIMITED -Do Not Intubate/DNI   # Symptom management: Patient is receiving these palliative interventions for symptom management with an intent to improve quality of life.   -Pain, severe in setting of metastatic colon cancer with mets to bone, liver, and lung Remains on IV Dilaudid PCA  also on MS Contin   Also on IV steroids and NSAID are assisting with edema management.   -Continue MS Contin to 90 mg every 8 hours.     -Continue PCA with IV Dilaudid 0.75 mg every 10 minutes bolus.  No continuous infusion as on long-acting oral opioids.   -Decrease IV dexamethasone 4 mg 8 hours.  Continue to titrate medication down as able.   -Continue Celebrex 200 mg twice daily for total of 10 doses.   Patient receiving Protonix 40 mg daily.           -Mood/appetite   -Personally reviewed EKG which noted QTc on 11/09/2023 to be 432   -Continue olanzapine to 5 mg nightly   -Constipation   -Continue senna 1 tab twice daily  # Psychosocial Support:  -Brother, mother, son  -Chaplain following for support  # Discharge Planning: To Be Determined   Discussed with: Patient and mother, they  wish to continue current mode of care in the hospital, not in favor of addition of hospice services at this time.   Thank you for allowing the palliative care team to participate in the care Joseph Maduro.  mod MDM Joseph Hawking MD Palliative Care Provider PMT # [phone]  If patient remains symptomatic despite maximum doses, please call PMT at  [phone] between 0700 and 1900. Outside of these hours, please call attending, as PMT does not have night coverage.

## 2023-11-22 NOTE — Progress Notes (Signed)
 PROGRESS NOTE    Joseph Haney  JYN:[id number] DOB: 1975/10/25 DOA: 11/06/2023 PCP: Frederic Jericho, [name]  Chief Complaint  Patient presents with   Shortness of Breath   Weakness    Hospital Course:  Joseph Haney is 48 y.o. 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  He was admitted on 3/7 for pain control.  Palliative care and oncology were consulted.  Patient has required high-dose opioids for adequate pain control.  Currently necessitating Dilaudid PCA with MS Contin.  At this time family would like to continue to pursue all aggressive measures and is not interested in hospice discussions.  Subjective: No acute events overnight.  On evaluation today patient is happily conversing with his family members on the phone.  He has no acute complaints  Objective: Vitals:   11/22/23 0011 11/22/23 0404 11/22/23 0552 11/22/23 0830  BP:   113/76   Pulse:   88   Resp: 18 17 18 18   Temp:   98.1 F (36.7 C)   TempSrc:      SpO2:   100% 98%  Weight:      Height:        Intake/Output Summary (Last 24 hours) at 11/22/2023 1024 Last data filed at 11/22/2023 1000 Gross per 24 hour  Intake 70 ml  Output 1800 ml  Net -1730 ml   Filed Weights   11/06/23 1450 11/18/23 0500  Weight: 56.2 kg 87.2 kg    Examination: General exam: Appears calm and comfortable, frail appearing. Respiratory system: No work of breathing, symmetric chest wall expansion Cardiovascular system: S1 & S2 heard, RRR.  Gastrointestinal system: Abdomen is nondistended, soft and nontender.  Neuro: Alert and oriented. No focal neurological deficits. Extremities: Symmetric, expected ROM Skin: No rashes, lesions Psychiatry:  Mood & affect appropriate for situation.   Assessment & Plan:  Principal Problem:   Cancer associated pain Active Problems:   Stage IV carcinoma of colon  (HCC)   Goals of care, counseling/discussion   Generalized weakness   Hypokalemia   Protein-calorie malnutrition, severe (HCC)   Malnutrition of moderate degree   High risk medication use   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 [facility])   Need for emotional support   Medication management   Palliative care encounter   Appetite loss   HTN (hypertension)   Pressure injury of skin   Elevated LFTs   Counseling and coordination of care   Drug-induced constipation    Cancer associated pain - Currently on Dilaudid PCA with MS Contin.  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, liver, bones - MRIs and PET scans reveal worsening of disease - Multiple GOC conversations with family and patient, at this time they are requesting continued aggressive management - Family continues to hope to pursue chemotherapy, at this time does not have enough functional reserve to pursue this - If patient gains enough strength to ambulate they will attempt to discharge and seek second opinion elsewhere - Oncology following and agrees with the above - Please see GOC conversation as documented from 3/18 note -- Will continue to be available to patient to have goals of care  conversations daily.  Has multiple different family members invested in his care.  Multiple unstageable decubitus ulcers - Wound care was consulted.  Has been proceeding conservatively however patient is requesting full aggressive care - Have added Santyl for now, patient is unlikely to tolerate debridement given his inability to turn or participate with PT - This further complicates his care. - Continue to discuss daily  Generalized  weakness - Progressive functional decline from underlying malignancy - Repeat MRIs reveal worsening metastatic spread - Essentially bedbound at home, family does report he was able to intermittently ambulate.  Multiple pressure ulcers present on arrival. -- Courage continued PT/OT as tolerated  Hypokalemia - Replace as needed - Continue to trend CMP  Left arm swelling, resolved - Secondary to IV infiltration on 3/14.  CT forearm with diffuse edema  Drug-induced constipation - Secondary to high opioid burden -- Added aggressive bowel regimen, cont to monitor response.  Elevated LFTs - Secondary to metastasis, continue to monitor on labs.  No acute intervention warranted at this time.  Hypertension - Continue to monitor, titrate as needed  Severe protein calorie malnutrition - Worsening anorexia secondary to malignancy - Continue supplements   DVT prophylaxis: Lovenox   Code Status: Limited: Do not attempt resuscitation (DNR) -DNR-LIMITED -Do Not Intubate/DNI  Family Communication:  Discussed directly with patient and son at bedside. Disposition:  Inpatient still hospitalized for pain control, will discharge to home with [facility] vs hospice when he is able to wean from PCA, he remains resistant to that at this time.  Consultants:  Treatment Team:  Consulting Physician: Malachy Mood, MD  Procedures:    Antimicrobials:  Anti-infectives (From admission, onward)    None       Data Reviewed: I have personally reviewed following labs and imaging studies CBC: Recent Labs  Lab 11/16/23 0229 11/16/23 0859 11/19/23 0418  WBC 12.6*  --  13.2*  NEUTROABS 9.6*  --   --   HGB 6.8* 8.8* 8.5*  HCT 22.8* 28.3* 27.2*  MCV 88.4  --  86.9  PLT 230  --  168   Basic Metabolic Panel: Recent Labs  Lab 11/16/23 0229 11/19/23 0418  NA 138 136  K 4.8 4.4  CL 104 100  CO2 24 26  GLUCOSE 127* 114*  BUN 38* 28*  CREATININE 0.73 0.55*  CALCIUM 8.6* 8.7*  MG 2.1  --     GFR: Estimated Creatinine Clearance: 111.2 mL/min (A) (by C-G formula based on SCr of 0.55 mg/dL (L)). Liver Function Tests: Recent Labs  Lab 11/16/23 0229 11/19/23 0418  AST 86* 126*  ALT 44 52*  ALKPHOS 1,034* 1,013*  BILITOT 0.5 0.7  PROT 6.4* 6.4*  ALBUMIN 1.8* 2.0*   CBG: No results for input(s): "GLUCAP" in the last 168 hours.  No results found for this or any previous visit (from the past 240 hours).   Radiology Studies: No results found.  Scheduled Meds:  carvedilol  25 mg Oral BID WC   Chlorhexidine Gluconate Cloth  6 each Topical Daily   collagenase   Topical Daily   dexamethasone  4 mg Intravenous Q8H   enoxaparin (LOVENOX) injection  40 mg Subcutaneous Q24H   HYDROmorphone   Intravenous Q4H   lactulose  20 g Oral BID   morphine  90 mg Oral Q8H   OLANZapine  5 mg Oral QHS   pantoprazole  40 mg Oral Daily   potassium chloride  20 mEq Oral Daily   Ensure Max Protein  11  oz Oral BID   senna  1 tablet Oral BID   sodium chloride flush  10-40 mL Intracatheter Q12H   Continuous Infusions:   LOS: 16 days  MDM: Patient is high risk for one or more organ failure.  They necessitate ongoing hospitalization for continued IV therapies and subsequent lab monitoring. Total time spent interpreting labs and vitals, coordinating care amongst consultants and care team members, directly assessing and discussing care with the patient and/or family:    Debarah Crape, DO [facility]  To contact the attending physician between 7A-7P please use Epic Chat. To contact the covering physician during after hours 7P-7A, please review Amion.   11/22/2023, 10:24 AM   *This document has been created with the assistance of dictation software. Please excuse typographical errors. *

## 2023-11-22 NOTE — Plan of Care (Signed)

## 2023-11-23 ENCOUNTER — Inpatient Hospital Stay (HOSPITAL_COMMUNITY)

## 2023-11-23 DIAGNOSIS — I159 Secondary hypertension, unspecified: Secondary | ICD-10-CM | POA: Diagnosis not present

## 2023-11-23 DIAGNOSIS — G893 Neoplasm related pain (acute) (chronic): Secondary | ICD-10-CM | POA: Diagnosis not present

## 2023-11-23 DIAGNOSIS — L89300 Pressure ulcer of unspecified buttock, unstageable: Secondary | ICD-10-CM | POA: Diagnosis not present

## 2023-11-23 DIAGNOSIS — C189 Malignant neoplasm of colon, unspecified: Secondary | ICD-10-CM | POA: Diagnosis not present

## 2023-11-23 LAB — COMPREHENSIVE METABOLIC PANEL
ALT: 46 U/L — ABNORMAL HIGH (ref 0–44)
AST: 98 U/L — ABNORMAL HIGH (ref 15–41)
Albumin: 2.1 g/dL — ABNORMAL LOW (ref 3.5–5.0)
Alkaline Phosphatase: 790 U/L — ABNORMAL HIGH (ref 38–126)
Anion gap: 12 (ref 5–15)
BUN: 36 mg/dL — ABNORMAL HIGH (ref 6–20)
CO2: 22 mmol/L (ref 22–32)
Calcium: 8.5 mg/dL — ABNORMAL LOW (ref 8.9–10.3)
Chloride: 101 mmol/L (ref 98–111)
Creatinine, Ser: 0.56 mg/dL — ABNORMAL LOW (ref 0.61–1.24)
GFR, Estimated: >60 mL/min (ref >60)
Glucose, Bld: 136 mg/dL — ABNORMAL HIGH (ref 70–99)
Potassium: 3.8 mmol/L (ref 3.5–5.1)
Sodium: 135 mmol/L (ref 135–145)
Total Bilirubin: 0.7 mg/dL (ref 0.0–1.2)
Total Protein: 6.4 g/dL — ABNORMAL LOW (ref 6.5–8.1)

## 2023-11-23 LAB — CBC
HCT: 28.1 % — ABNORMAL LOW (ref 39.0–52.0)
Hemoglobin: 8.4 g/dL — ABNORMAL LOW (ref 13.0–17.0)
MCH: 26.8 pg (ref 26.0–34.0)
MCHC: 29.9 g/dL — ABNORMAL LOW (ref 30.0–36.0)
MCV: 89.5 fL (ref 80.0–100.0)
Platelets: 152 10*3/uL (ref 150–400)
RBC: 3.14 MIL/uL — ABNORMAL LOW (ref 4.22–5.81)
RDW: 18.6 % — ABNORMAL HIGH (ref 11.5–15.5)
WBC: 18.8 10*3/uL — ABNORMAL HIGH (ref 4.0–10.5)
nRBC: 7.7 % — ABNORMAL HIGH (ref 0.0–0.2)

## 2023-11-23 MED ORDER — DEXAMETHASONE SODIUM PHOSPHATE 4 MG/ML IJ SOLN
4.0000 mg | Freq: Two times a day (BID) | INTRAMUSCULAR | Status: AC
Start: 2023-11-23 — End: 2023-11-26
  Administered 2023-11-23 – 2023-11-26 (×6): 4 mg via INTRAVENOUS
  Filled 2023-11-23 (×6): qty 1

## 2023-11-23 MED ORDER — HYDROMORPHONE 1 MG/ML IV SOLN: INTRAVENOUS | Status: DC

## 2023-11-23 MED ORDER — HYDROMORPHONE 1 MG/ML IV SOLN
INTRAVENOUS | Status: DC
  Administered 2023-11-23: 30 mg via INTRAVENOUS
  Administered 2023-11-24: 2 mg via INTRAVENOUS
  Administered 2023-11-24: 3 mg via INTRAVENOUS
  Filled 2023-11-23: qty 30

## 2023-11-23 NOTE — Progress Notes (Signed)
   11/23/23 1112  Spiritual Encounters  Type of Visit Follow up  Care provided to: Pt and family  Referral source Physician  Reason for visit Routine spiritual support   I returned to visit with Mr. Mikami and on this visit was able to meet his mother, Malena Catholic.  Mr. Ishaq had more energy and seemed to appreciate the visit. His mother expressed her faith and hope for [place].  I affirmed their faith and aimed to support their spirits through words of encouragement. With their consent I offered prayer and gratitude to be with them, hoping for Mr. Fawcett to have the best days that he can and to go day by day.  They know spiritual care remains available. Will continue to follow and offer support. Shakaya Bhullar L. Sophronia Simas, M.Div [phone]

## 2023-11-23 NOTE — Progress Notes (Signed)
 Joseph Haney   DOB:May 10, 1976   ZO#:[id number]   JWJ#:[id number]  Medical oncology follow-up note  Subjective: Patient is still on Dilaudid PCA, was able to get out of bed and walk with physical therapist with 2 people assistance, overall very weak.  Objective:  Vitals:   11/23/23 1412 11/23/23 1600  BP:    Pulse: 84   Resp:  18  Temp:    SpO2: 94% 100%    Body mass index is 33.01 kg/m.  Intake/Output Summary (Last 24 hours) at 11/23/2023 1800 Last data filed at 11/23/2023 1400 Gross per 24 hour  Intake 130 ml  Output 1800 ml  Net -1670 ml     Sclerae unicteric  Oropharynx clear  MSK no focal spinal tenderness, no peripheral edema  Neuro nonfocal   CBG (last 3)  No results for input(s): "GLUCAP" in the last 72 hours.    Labs:  Lab Results  Component Value Date   WBC 18.8 (H) 11/23/2023   HGB 8.4 (L) 11/23/2023   HCT 28.1 (L) 11/23/2023   MCV 89.5 11/23/2023   PLT 152 11/23/2023   NEUTROABS 9.6 (H) 11/16/2023      Urine Studies No results for input(s): "UHGB", "CRYS" in the last 72 hours.  Invalid input(s): "UACOL", "UAPR", "USPG", "UPH", "UTP", "UGL", "UKET", "UBIL", "UNIT", "UROB", "ULEU", "UEPI", "UWBC", "URBC", "UBAC", "CAST", "UCOM", "BILUA"  Basic Metabolic Panel: Recent Labs  Lab 11/19/23 0418 11/23/23 0316  NA 136 135  K 4.4 3.8  CL 100 101  CO2 26 22  GLUCOSE 114* 136*  BUN 28* 36*  CREATININE 0.55* 0.56*  CALCIUM 8.7* 8.5*   GFR Estimated Creatinine Clearance: 111.2 mL/min (A) (by C-G formula based on SCr of 0.56 mg/dL (L)). Liver Function Tests: Recent Labs  Lab 11/19/23 0418 11/23/23 0316  AST 126* 98*  ALT 52* 46*  ALKPHOS 1,013* 790*  BILITOT 0.7 0.7  PROT 6.4* 6.4*  ALBUMIN 2.0* 2.1*   No results for input(s): "LIPASE", "AMYLASE" in the last 168 hours. No results for input(s): "AMMONIA" in the last 168 hours. Coagulation profile No results for input(s): "INR", "PROTIME" in the last 168 hours.  CBC: Recent Labs  Lab  11/19/23 0418 11/23/23 0316  WBC 13.2* 18.8*  HGB 8.5* 8.4*  HCT 27.2* 28.1*  MCV 86.9 89.5  PLT 168 152   Cardiac Enzymes: No results for input(s): "CKTOTAL", "CKMB", "CKMBINDEX", "TROPONINI" in the last 168 hours. BNP: Invalid input(s): "POCBNP" CBG: No results for input(s): "GLUCAP" in the last 168 hours.  D-Dimer No results for input(s): "DDIMER" in the last 72 hours. Hgb A1c No results for input(s): "HGBA1C" in the last 72 hours. Lipid Profile No results for input(s): "CHOL", "HDL", "LDLCALC", "TRIG", "CHOLHDL", "LDLDIRECT" in the last 72 hours. Thyroid function studies No results for input(s): "TSH", "T4TOTAL", "T3FREE", "THYROIDAB" in the last 72 hours.  Invalid input(s): "FREET3" Anemia work up No results for input(s): "VITAMINB12", "FOLATE", "FERRITIN", "TIBC", "IRON", "RETICCTPCT" in the last 72 hours. Microbiology No results found for this or any previous visit (from the past 240 hours).     Studies:  DG CHEST PORT 1 VIEW Result Date: 11/23/2023 CLINICAL DATA:  Hypoxia EXAM: PORTABLE CHEST 1 VIEW COMPARISON:  Chest x-ray 11/10/2023 FINDINGS: Right chest port catheter tip projects over the SVC. The heart size and mediastinal contours are within normal limits. There is a small focal opacity in the retrocardiac region with slightly spiculated borders, indeterminate. The lungs are otherwise clear. There is no pleural effusion or pneumothorax.  The visualized skeletal structures are unremarkable. IMPRESSION: Small focal opacity in the retrocardiac region with slightly spiculated borders, indeterminate. Recommend further evaluation with chest CT. Electronically Signed   By: Darliss Cheney M.D.   On: 11/23/2023 17:05     Assessment: 48 y.o. male   Metastatic colon cancer Cancer related pain, anorexia, and weakness Anemia and leukocytosis Hypertension Transaminitis Severe protein and calorie malnutrition Depression Sacral pressure ulcer    Plan:  -Due to his  very poor performance status, especially mobility limitation, he is not a candidate for chemotherapy.  I have made it very clear to pt and his family -He completed a short course of palliative radiation last week, pain control is still the key.  Perative care team has been seeing him frequently, for pain control and other symptom management.  He is still on Dilaudid PCA, not sure if he is ready to be switched to oral narcotics, to get him ready to be discharged. -I will f/u as needed, please call me if anything I can assist his management and discharge.    Malachy Mood, MD 11/23/2023

## 2023-11-23 NOTE — Plan of Care (Signed)

## 2023-11-23 NOTE — Consult Note (Addendum)
 WOC contacted by providers about pressure injury; wound is unstageable and patient now desires aggressive care.  I have discussed with palliative care and hospitlalist. Because patient desires full acces to care I have updated wound care orders for Santyl (enzymatic debridement), verified low air loss mattress in place.  I did make team aware that once this area opened can be painful for patient and will not heal based on patient's current status, however team wishes to proceed with full advanced care for debridement. Patient will not be a candidate for surgical debridement or for PT debridements based on need to turn and stay turned for at least 30 mins or more 3x wk.   We will follow weekly for assessment per patient's allowance with assessments.   Lenell Mcconnell [facility], CNS, [employer] [phone]

## 2023-11-23 NOTE — Consult Note (Addendum)
 WOC Nurse wound follow up Refer to previous WOC nurses' consult note on 3/16. Pt has an Unstageable pressure injury to the sacrum.   Secure chat message sent to the bedside nurse as follows; "I need to assess this patient's sacrum wound and he apparently does not like to turn. Can you let me know when he is getting cleaned up today and I will come so he only has to be turned once to minimize discomfort?"   Post note 1100: Checked with patient and he agrees to have the sacrum wound assessment at 2:00 today when his his bath performed. Post note 2:00: Pt now requesting bath and wound assessment at 2:30.  Thank-you,  Cammie Mcgee MSN, RN, CWOCN, [place], CNS [phone]

## 2023-11-23 NOTE — Progress Notes (Addendum)
 Occupational Therapy Treatment Patient Details Name: Joseph Haney MRN: [id number] DOB: [birth year] Today's Date: 11/23/2023   History of present illness Joseph Haney is a 48 y.o. male admitted with cancer associated pain. MRI 11/11/23 1. Diffuse osseous metastatic disease throughout the lumbar spine, lower thoracic spine, and included posterior pelvis, 2. Slight bulging of the posterior walls of the T12 through L3 vertebral bodies with a small amount of tumor extending into the anterior epidural spaces at these levels, 3. Subtle superior endplate compression fracture of the L1 and L2 vertebral bodies. PMH: colon cancer (s/p partial colectomy 2022), liver and pelvic bone metastasis   OT comments  Patient was seen as a Co-tx due to complexity of care and mobility and safety needs. Patient was eager to work with therapy and open to EOB sitting as wound nurse and nursing to address skin and peri region bathing post therapy session thus OOB this session not possible. See PT note for LE status with pt having significant DOE/SOB at EOB with light therex with scapular retraction after light bathing activity with significant support and assist.  Nursing present- see SpO2 and limited activity tolerance and breathing capacity at EOB outlined below. Patient with delayed processing and decreased insight into deficits with diminished overall problem solving this session. Patient continues to require skilled OT services in Acute care setting however potential for goal achievement and progression uncertain based on pain and current deficits. Extensive assist and DME needs if home is patient and family's decided plan however continued inpatient follow up therapy, <3 hours/day vs potential LTC pending functional improvement in acute care setting. Appreciate team and palliative notes upon chart review and ongoing care.        If plan is discharge home, recommend the following:  Two people to help with walking and/or  transfers;Two people to help with bathing/dressing/bathroom;Direct supervision/assist for medications management;Assist for transportation;Direct supervision/assist for financial management;Help with stairs or ramp for entrance   Equipment Recommendations  BSC/3in1;Tub/shower bench;Hospital bed;Hoyer lift       Precautions / Restrictions Precautions Precautions: Fall Recall of Precautions/Restrictions: Intact Precaution/Restrictions Comments: Log rolled during bed mobility for comfort Restrictions Weight Bearing Restrictions Per Provider Order: No       Mobility Bed Mobility     Rolling: Max assist, +2 for physical assistance, +2 for safety/equipment, Used rails Sidelying to sit: +2 for physical assistance, +2 for safety/equipment, Max assist, HOB elevated, Used rails            Transfers                         Balance Overall balance assessment: Needs assistance Sitting-balance support: Single extremity supported Sitting balance-Leahy Scale: Poor Sitting balance - Comments: EOB with + 2 assist this sesison                                   ADL either performed or assessed with clinical judgement   ADL Overall ADL's : Needs assistance/impaired     Grooming: Wash/dry hands;Wash/dry face;Sitting;Minimal assistance (with max A for sitting support EOB) Grooming Details (indicate cue type and reason):  (+2 bed mobility and EOB this session with significant taxing effort physically and respitory capacity)                               General ADL Comments: pt  monitored for SpO2 EOB with fluctuating pleth and poor ability to determine accurate readings nursing in room and reported CO2 levels high (22-24) with exersion and recovered to normal levels with breathing strateiges and rest breaks    Extremity/Trunk Assessment Upper Extremity Assessment Upper Extremity Assessment: Generalized weakness   Lower Extremity Assessment Lower  Extremity Assessment: Generalized weakness (see PT note for this session's LE reassessment of strength/AROM)        Vision       Perception     Praxis     Communication Communication Communication: Impaired Factors Affecting Communication: Other (comment) (decreased clairty with DOE/SOB)   Cognition Arousal: Alert Behavior During Therapy: Flat affect Cognition:  (see comments)             OT - Cognition Comments: pt with slower procesisng this visit and decreased insight into deficits and factors of severe LE and trunk weakness and DOE can impact all mobility and function                 Following commands: Impaired Following commands impaired: Follows one step commands with increased time      Cueing   Cueing Techniques: Verbal cues  Exercises Exercises: General Upper Extremity (scapular reatraction 5 reps in sitting with need to return to supine due to maximal exersion and inacurate SpO2 readings captured)    Shoulder Instructions       General Comments see above forfluctualing and decreased readings consistency  SpO2 readings which recovered to 100% on 2 ltrs of O2 at rest    Pertinent Vitals/ Pain       Pain Assessment Pain Assessment: 0-10 Pain Score: 8  Pain Location: back Pain Descriptors / Indicators: Constant, Discomfort Pain Intervention(s): Limited activity within patient's tolerance, PCA encouraged, Utilized relaxation techniques, Repositioned, Monitored during session   Frequency  Min 2X/week        Progress Toward Goals  OT Goals(current goals can now be found in the care plan section)  Progress towards OT goals: Not progressing toward goals - comment  Acute Rehab OT Goals Patient Stated Goal: to get stronger OT Goal Formulation: With patient Time For Goal Achievement: 11/25/23 Potential to Achieve Goals: Fair ADL Goals Pt Will Perform Grooming: with mod assist;sitting Pt Will Perform Upper Body Bathing: with mod  assist;sitting Pt Will Transfer to Toilet: with max assist;bedside commode Additional ADL Goal #1: Pt will transition from supine to sitting EOB with Max A in order to participate in self care task while seted EOB and progress OOB activity.  Plan      Co-evaluation    PT/OT/SLP Co-Evaluation/Treatment: Yes Reason for Co-Treatment: Complexity of the patient's impairments (multi-system involvement);For patient/therapist safety;To address functional/ADL transfers PT goals addressed during session: Mobility/safety with mobility;Balance;Proper use of DME;Strengthening/ROM OT goals addressed during session: ADL's and self-care;Proper use of Adaptive equipment and DME      AM-PAC OT "6 Clicks" Daily Activity     Outcome Measure   Help from another person eating meals?: A Little Help from another person taking care of personal grooming?: A Little Help from another person toileting, which includes using toliet, bedpan, or urinal?: Total Help from another person bathing (including washing, rinsing, drying)?: Total Help from another person to put on and taking off regular upper body clothing?: A Lot Help from another person to put on and taking off regular lower body clothing?: Total 6 Click Score: 11    End of Session Equipment Utilized During Treatment: Oxygen  OT Visit  Diagnosis: Unsteadiness on feet (R26.81);Muscle weakness (generalized) (M62.81);Pain   Activity Tolerance Patient limited by fatigue;Patient limited by pain   Patient Left in bed;with call bell/phone within reach;with nursing/sitter in room   Nurse Communication Other (comment) (O2 levels with nursing present)        Time: 9147-8295 OT Time Calculation (min): 31 min  Charges: OT General Charges $OT Visit: 1 Visit OT Treatments $Self Care/Home Management : 8-22 mins  Deran Barro OT/L Acute Rehabilitation Department  [phone]  11/23/2023, 4:33 PM

## 2023-11-23 NOTE — Consult Note (Signed)
 WOC Nurse Consult Note: Reason for Consult: Refer to previous consult note on 3/21.   Sacrum with Unstageable pressure injury, 6X6X.3cm, strong foul odor, mod amt tan drainage, 50% loose eschar, 50% red and moist. Pt has multiple systemic factors which can impair healing; he is very thin and immobile, frequently incontinent of loose stools, and wound is in close proximity to his rectum and it is difficult to keep the wound from becoming soiled.  Pressure Injury POA: No Continue present plan of care as previously ordered to assist with removal of nonviable tissue.  Topical treatment orders provided for bedside nurses to perform: Cleanse sacral wound with saline, pat dry Apply 1/4" thick layer of Santyl,  top with saline moist gauze and medium sized foam dressing, change daily. Pt is on a low airloss mattress to reduce pressure and was medicated for pain prior to turning and bathing.  He assisted with turning and tolerated with minimal amt discomfort.  Discussed wound appearance and plan of care with patient and family members.  WOC team will reassess again next week to determine if a change in the plan of care is indicated at that time.  Thank-you,  Cammie Mcgee MSN, RN, CWOCN, [place], CNS [phone]

## 2023-11-23 NOTE — Progress Notes (Addendum)
 Physical Therapy Treatment Patient Details Name: Cabot Cromartie MRN: [id number] DOB: 1976-01-24 Today's Date: 11/23/2023   History of Present Illness Hiren Peplinski is a 48 y.o. male admitted with cancer associated pain. MRI 11/11/23 1. Diffuse osseous metastatic disease throughout the lumbar spine, lower thoracic spine, and included posterior pelvis, 2. Slight bulging of the posterior walls of the T12 through L3 vertebral bodies with a small amount of tumor extending into the anterior epidural spaces at these levels, 3. Subtle superior endplate compression fracture of the L1 and L2 vertebral bodies. PMH: colon cancer (s/p partial colectomy 2022), liver and pelvic bone metastasis    PT Comments  Pt supine in bed with mother, son and nephew at bedside. Pt agreeable to therapy, reporting 8/10 pain with recent PCA use. Pt with 0/5 strength throughout BLE, therapist guiding pt through passive dorsiflexion and hip/knee flexion without increased pain; pt unable to perform plantarflexion, dorsiflexion, knee flexion, knee extension, hip abduction, hip adduction or hip flexion. Pt needing max A+2 for supine<>sit transfer, mobilizing through log rolling for comfort in back, heavy cues for sequencing and hand placement. Pt appears somewhat distracted at times, needing increased time and multimodal cues, decreased initiation and difficulty problem solving. Once seated EOB pt needing at least single UE support to prop while other UE engaged in activity, along with min to mod A to support trunk. Pt releasing BUE from bed needing heavy mod A for trunk control, LOB in all directions, bil feet positioned flat on floor in hip width position, no activation of BLE noted in this position. Pt fatigues quickly while seated EOB, tolerates ~10 minutes. Pt needing heavy verbal cues for pursed lip breathing throughout session, questionable pleth on SpO2 reading varying heavily 50-90% and HR 90s, RN in room monitoring CO2 number on  PCA, placed 2L O2 on pt. Pt on 2L O2 with SpO2 >92% at EOS- RN aware. Pt significantly limited with therapy, demonstrates profound weakness throughout BLE, weakness in trunk requiring physical assist for static sitting balance EOB, and fatigues quickly and needing heavy education on pursed lip breathing. Patient will benefit from continued inpatient follow up therapy, <3 hours/day vs LTC pending functional improvement in acute care setting.   If plan is discharge home, recommend the following: Two people to help with walking and/or transfers;A lot of help with bathing/dressing/bathroom;Assistance with cooking/housework;Assist for transportation   Can travel by private vehicle     No  Equipment Recommendations  Other (comment) (TBD)    Recommendations for Other Services       Precautions / Restrictions Precautions Precautions: Fall Precaution/Restrictions Comments: Log rolled during bed mobility for comfort Restrictions Weight Bearing Restrictions Per Provider Order: No     Mobility  Bed Mobility Overal bed mobility: Needs Assistance Bed Mobility: Supine to Sit, Sit to Supine     Supine to sit: Max assist, +2 for physical assistance, +2 for safety/equipment Sit to supine: Max assist, +2 for physical assistance, +2 for safety/equipment   General bed mobility comments: max A+2 for supine to sit through log rolling for comfort, heavy cues for hand placement and sequencing, no active LE movement, limited BUE assist to power up to sitting    Transfers                        Ambulation/Gait                   Stairs  Wheelchair Mobility     Tilt Bed    Modified Rankin (Stroke Patients Only)       Balance Overall balance assessment: Needs assistance Sitting-balance support: Feet supported, Single extremity supported Sitting balance-Leahy Scale: Poor Sitting balance - Comments: Pt seated EOB ~10 minutes, mod to min A for static sitting  EOB, able to release single UE while propping on other UE, poor trunk control when attempting to lift BUE with LOB in all directions                                    Communication Communication Communication: Impaired  Cognition Arousal: Alert Behavior During Therapy: Flat affect   PT - Cognitive impairments: Initiation, Sequencing, Problem solving                       PT - Cognition Comments: pt appears somewhat distracted, slow to respond to questions, decreased initiation and problem solving needing increased cues and time; mom, son and nephew visiting Following commands: Impaired Following commands impaired: Follows one step commands with increased time    Cueing Cueing Techniques: Verbal cues  Exercises General Exercises - Lower Extremity Ankle Circles/Pumps: PROM, Both, 10 reps Hip Flexion/Marching: PROM, Both, 10 reps    General Comments        Pertinent Vitals/Pain Pain Assessment Pain Assessment: 0-10 Pain Score: 8  Pain Location: back Pain Descriptors / Indicators: Constant, Discomfort Pain Intervention(s): Limited activity within patient's tolerance, Monitored during session, Repositioned    Home Living                          Prior Function            PT Goals (current goals can now be found in the care plan section) Progress towards PT goals: Not progressing toward goals - comment (profound weakness, limited activity tolerance, high pain)    Frequency    Min 2X/week      PT Plan      Co-evaluation PT/OT/SLP Co-Evaluation/Treatment: Yes Reason for Co-Treatment: Complexity of the patient's impairments (multi-system involvement);For patient/therapist safety;To address functional/ADL transfers          AM-PAC PT "6 Clicks" Mobility   Outcome Measure  Help needed turning from your back to your side while in a flat bed without using bedrails?: Total Help needed moving from lying on your back to sitting on  the side of a flat bed without using bedrails?: Total Help needed moving to and from a bed to a chair (including a wheelchair)?: Total Help needed standing up from a chair using your arms (e.g., wheelchair or bedside chair)?: Total Help needed to walk in hospital room?: Total Help needed climbing 3-5 steps with a railing? : Total 6 Click Score: 6    End of Session Equipment Utilized During Treatment: Oxygen Activity Tolerance: Patient limited by fatigue Patient left: in bed;with call bell/phone within reach;with nursing/sitter in room;with family/visitor present Nurse Communication: Mobility status PT Visit Diagnosis: Other abnormalities of gait and mobility (R26.89);Muscle weakness (generalized) (M62.81);Difficulty in walking, not elsewhere classified (R26.2);Pain     Time: 1610-9604 PT Time Calculation (min) (ACUTE ONLY): 31 min  Charges:    $Therapeutic Activity: 8-22 mins PT General Charges $$ ACUTE PT VISIT: 1 Visit  Domenick Bookbinder PT, DPT 11/23/23, 4:25 PM

## 2023-11-23 NOTE — Progress Notes (Signed)
 Daily Progress Note   Patient Name: Joseph Haney       Date: 11/23/2023 DOB: April 21, 1976  Age: 48 y.o. MRN#: [id number] Attending Physician: Debarah Crape, DO Primary Care Physician: Frederic Jericho, [name] Admit Date: 11/06/2023 Length of Stay: 17 days  Reason for Consultation/Follow-up: Establishing goals of care, Non pain symptom management, and Pain control  Subjective:   CC: Following up today regarding continued discussions about planning.  Palliative medicine team following up regarding symptom management.  Subjective:  Reviewed EMR prior to presenting to bedside.  Remains on Dilaudid PCA.    Spent time answering questions as able.  Noted palliative medicine team continue to follow along and engage in conversations as able.    Objective:   Vital Signs:  BP (!) 105/58 (BP Location: Left Arm)   Pulse 86   Temp 98.2 F (36.8 C) (Oral)   Resp 18   Ht 5\' 4"  (1.626 m)   Wt 87.2 kg   SpO2 92%   BMI 33.01 kg/m   Physical Exam: General: NAD, alert, cachectic, chronically ill-appearing Cardiovascular: RRR Respiratory: no increased work of breathing noted, not in respiratory distress Neuro: A&Ox4, following commands easily Psych: appropriately answers all questions  Imaging:  I personally reviewed recent imaging.   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  Recent imaging has shown progression of the disease and patient's bone.  Oncology consulted for recommendations.  Palliative medicine team consulted to assist with symptom management.  Recommendations/Plan: # Complex medical decision making/goals of care:  - Discussed care with patient as detailed above in HPI.  Have continued to have multiple lengthy conversations about patient's medical status, prognosis, not  being a candidate for further cancer directed therapy based on his poor functional and nutritional status, and discussed possible support moving forward through home hospice.  Patient continues to struggle with processing this information and continues to focus on getting "better".  Palliative medicine team continuing to follow along to engage in conversations as able.  -  Code Status: Limited: Do not attempt resuscitation (DNR) -DNR-LIMITED -Do Not Intubate/DNI   # Symptom management: Patient is receiving these palliative interventions for symptom management with an intent to improve quality of life.   -Pain, severe in setting of metastatic colon cancer with mets to bone, liver, and lung Remains on IV Dilaudid PCA  also on MS Contin   Also on IV steroids and NSAID are assisting with edema management. Wound care also on board due to pressure injury on Sacrum.    -Continue MS Contin to 90 mg every 8 hours.     -Continue PCA with IV Dilaudid 0.75 mg every 10 minutes bolus.  No continuous infusion as on long-acting oral opioids.   Decadron 4 mg IV Q 12 hours, okay to down titrate as appropriate.    -Continue Celebrex 200 mg twice daily for total of 10 doses.   Patient receiving Protonix 40 mg daily.           -Mood/appetite   -Personally reviewed EKG which noted QTc on 11/09/2023 to be 432   -Continue olanzapine to 5 mg nightly   -Constipation   -Continue senna 1 tab twice daily  # Psychosocial Support:  -Brother, mother, son  -Chaplain following for support  # Discharge Planning: To Be Determined   Discussed with:  IDT  Thank you for allowing the palliative care team to participate in the care [name]. Low MDM Rosalin Hawking MD Palliative Care Provider PMT # [phone]  If patient remains symptomatic despite maximum doses, please call PMT at [phone] between 0700 and 1900. Outside of these hours, please call attending, as PMT does not have night coverage.

## 2023-11-23 NOTE — Progress Notes (Signed)
 PROGRESS NOTE    Mark Hassey  JYN:[id number] DOB: Jul 08, 1976 DOA: 11/06/2023 PCP: Frederic Jericho, [name]  Chief Complaint  Patient presents with   Shortness of Breath   Weakness    Hospital Course:  Trennon Torbeck is 48 y.o. 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  He was admitted on 3/7 for pain control.  Palliative care and oncology were consulted.  Patient has required high-dose opioids for adequate pain control.  Currently necessitating Dilaudid PCA with MS Contin.  At this time family would like to continue to pursue all aggressive measures and is not interested in hospice discussions.  Subjective: No acute events overnight.  Discussed again decreasing PCA pump, patient is willing to try this today.  This afternoon pt had some desaturations which quickly and spontaneously resolved. CXR was ordered.  Objective: Vitals:   11/23/23 0900 11/23/23 1213 11/23/23 1238 11/23/23 1412  BP:  107/62    Pulse:  83  84  Resp: 18  16   Temp:      TempSrc:      SpO2: 92% 100% 99% 94%  Weight:      Height:        Intake/Output Summary (Last 24 hours) at 11/23/2023 1609 Last data filed at 11/23/2023 1400 Gross per 24 hour  Intake 130 ml  Output 1800 ml  Net -1670 ml   Filed Weights   11/06/23 1450 11/18/23 0500  Weight: 56.2 kg 87.2 kg    Examination: General exam: Appears calm and comfortable, frail appearing. Respiratory system: No work of breathing, symmetric chest wall expansion Cardiovascular system: S1 & S2 heard, RRR.  Gastrointestinal system: Abdomen is nondistended, soft and nontender.  Neuro: Alert and oriented. No focal neurological deficits. Extremities: Symmetric, expected ROM Skin: No rashes, lesions Psychiatry:  Mood & affect appropriate for situation.   Assessment & Plan:  Principal Problem:   Cancer associated  pain Active Problems:   Stage IV carcinoma of colon (HCC)   Goals of care, counseling/discussion   Generalized weakness   Hypokalemia   Protein-calorie malnutrition, severe (HCC)   Malnutrition of moderate degree   High risk medication use   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 [facility])   Need for emotional support   Medication management   Palliative care encounter   Appetite loss   HTN (hypertension)   Pressure injury of skin   Elevated LFTs   Counseling and coordination of care   Drug-induced constipation    Intermittent Hypoxia --CT on arrival w enlarged pulmonary nodules, no acute process - Chest x-ray today mostly unremarkable - Intermittently requiring 1 to 2 L O2 - Patient's fraility and shallow respirations are certainly playing a role. -- Encouraged continued IS and FV  Cancer associated pain - Currently on Dilaudid PCA with MS Contin.  Continue daily titration, will try to decrease today. Discuss with Palliaitve - On Decadron, continue tapering as tolerated. - Continue Celebrex - Palliative care consulted for assistance with pain management and ongoing GOC discussions 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, liver, bones - MRIs and PET scans reveal worsening of disease - Multiple GOC conversations with family and patient, at this time they are requesting continued aggressive management - Family continues to hope to pursue chemotherapy, at this time does not have enough functional reserve to pursue this -  If patient gains enough strength to ambulate they will attempt to discharge and seek second opinion elsewhere - Oncology following and agrees with the above - Please see GOC conversation as documented from 3/18 note -- Will continue to be available to patient to have goals of care conversations daily.  Has multiple different family members invested in his care.  Multiple unstageable  decubitus ulcers - Wound care was consulted.  Believes unlikely to heal given difficulty with turning, keeping clean and poor nutrition.  - This further complicates his care. Have discussed with pt.  Generalized weakness - Progressive functional decline from underlying malignancy - Repeat MRIs reveal worsening metastatic spread - Essentially bedbound at home, family does report he was able to intermittently ambulate.  Multiple pressure ulcers present on arrival. -- Courage continued PT/OT as tolerated  Hypokalemia - Replace as needed - Continue to trend CMP  Left arm swelling, resolved - Secondary to IV infiltration on 3/14.    Drug-induced constipation - Secondary to high opioid burden -- Continue current bowel regimen  Elevated LFTs - Secondary to metastasis, continue to monitor on labs.  No acute intervention warranted at this time.  Hypertension - Continue to monitor, titrate as needed  Severe protein calorie malnutrition - Worsening anorexia secondary to malignancy - Continue supplements   DVT prophylaxis: Lovenox   Code Status: Limited: Do not attempt resuscitation (DNR) -DNR-LIMITED -Do Not Intubate/DNI  Family Communication:  Discussed directly with patient and son at bedside. Disposition:  Inpatient still hospitalized for pain control, will discharge to home with [facility] vs hospice when he is able to wean from PCA. Unfortunately, at this point it does not appear the patient will be able to leave the hospital anytime soon.  Have discussed with TOC. CIR/LTAC/SNF not an option due to PCA.  Consultants:  Treatment Team:  Consulting Physician: Malachy Mood, MD  Procedures:    Antimicrobials:  Anti-infectives (From admission, onward)    None       Data Reviewed: I have personally reviewed following labs and imaging studies CBC: Recent Labs  Lab 11/19/23 0418 11/23/23 0316  WBC 13.2* 18.8*  HGB 8.5* 8.4*  HCT 27.2* 28.1*  MCV 86.9 89.5  PLT 168 152   Basic  Metabolic Panel: Recent Labs  Lab 11/19/23 0418 11/23/23 0316  NA 136 135  K 4.4 3.8  CL 100 101  CO2 26 22  GLUCOSE 114* 136*  BUN 28* 36*  CREATININE 0.55* 0.56*  CALCIUM 8.7* 8.5*   GFR: Estimated Creatinine Clearance: 111.2 mL/min (A) (by C-G formula based on SCr of 0.56 mg/dL (L)). Liver Function Tests: Recent Labs  Lab 11/19/23 0418 11/23/23 0316  AST 126* 98*  ALT 52* 46*  ALKPHOS 1,013* 790*  BILITOT 0.7 0.7  PROT 6.4* 6.4*  ALBUMIN 2.0* 2.1*   CBG: No results for input(s): "GLUCAP" in the last 168 hours.  No results found for this or any previous visit (from the past 240 hours).   Radiology Studies: No results found.  Scheduled Meds:  carvedilol  25 mg Oral BID WC   Chlorhexidine Gluconate Cloth  6 each Topical Daily   collagenase   Topical Daily   dexamethasone  4 mg Intravenous Q12H   enoxaparin (LOVENOX) injection  40 mg Subcutaneous Q24H   HYDROmorphone   Intravenous Q4H   lactulose  20 g Oral BID   morphine  90 mg Oral Q8H   OLANZapine  5 mg Oral QHS   pantoprazole  40 mg Oral Daily  potassium chloride  20 mEq Oral Daily   Ensure Max Protein  11 oz Oral BID   senna  1 tablet Oral BID   sodium chloride flush  10-40 mL Intracatheter Q12H   Continuous Infusions:   LOS: 17 days  MDM: Patient is high risk for one or more organ failure.  They necessitate ongoing hospitalization for continued IV therapies and subsequent lab monitoring. Total time spent interpreting labs and vitals, coordinating care amongst consultants and care team members, directly assessing and discussing care with the patient and/or family:    Debarah Crape, DO [facility]  To contact the attending physician between 7A-7P please use Epic Chat. To contact the covering physician during after hours 7P-7A, please review Amion.   11/23/2023, 4:09 PM   *This document has been created with the assistance of dictation software. Please excuse typographical errors.  *

## 2023-11-23 NOTE — TOC Progression Note (Signed)
 Transition of Care [facility]) - Progression Note    Patient Details  Name: Joseph Haney MRN: [id number] Date of Birth: 1976/05/22  Transition of Care [facility]) CM/SW Contact  Jessie Foot, RN Phone Number: 11/23/2023, 4:08 PM  Clinical Narrative:    Reached out to [facility] (LTAC). Will not be able to accept due to insurance [facility]). Patient is not acceptable for CIR. No SNF will accept due to PCA or morphine drip for pain management.  Will discuss further with palliative for plan of care.     Barriers to Discharge: Continued Medical Work up  Expected Discharge Plan and Services                                               Social Determinants of Health (SDOH) Interventions SDOH Screenings   Food Insecurity: No Food Insecurity (11/07/2023)  Housing: High Risk (11/07/2023)  Transportation Needs: Unmet Transportation Needs (11/07/2023)  Utilities: Not At Risk (11/07/2023)  Social Connections: Unknown (11/07/2023)  Tobacco Use: Low Risk  (11/08/2023)    Readmission Risk Interventions    11/09/2023    9:44 AM 11/07/2023    2:14 PM  Readmission Risk Prevention Plan  Post Dischage Appt  Complete  Medication Screening  Complete  Transportation Screening Complete Complete  HRI or Home Care Consult Complete   Social Work Consult for Recovery Care Planning/Counseling Complete   Palliative Care Screening Not Applicable   Medication Review Oceanographer) Complete

## 2023-11-24 ENCOUNTER — Other Ambulatory Visit (HOSPITAL_COMMUNITY): Payer: Self-pay

## 2023-11-24 DIAGNOSIS — C189 Malignant neoplasm of colon, unspecified: Secondary | ICD-10-CM | POA: Diagnosis not present

## 2023-11-24 DIAGNOSIS — R630 Anorexia: Secondary | ICD-10-CM | POA: Diagnosis not present

## 2023-11-24 DIAGNOSIS — G893 Neoplasm related pain (acute) (chronic): Secondary | ICD-10-CM | POA: Diagnosis not present

## 2023-11-24 DIAGNOSIS — Z515 Encounter for palliative care: Secondary | ICD-10-CM | POA: Diagnosis not present

## 2023-11-24 DIAGNOSIS — R531 Weakness: Secondary | ICD-10-CM | POA: Diagnosis not present

## 2023-11-24 MED ORDER — OXYCODONE HCL 5 MG PO TABS
5.0000 mg | ORAL_TABLET | ORAL | Status: DC | PRN
  Administered 2023-11-24 – 2023-11-26 (×5): 5 mg via ORAL
  Filled 2023-11-24 (×5): qty 1

## 2023-11-24 MED ORDER — MORPHINE SULFATE ER 15 MG PO TBCR
115.0000 mg | EXTENDED_RELEASE_TABLET | Freq: Three times a day (TID) | ORAL | Status: DC
  Administered 2023-11-24 – 2023-12-03 (×27): 115 mg via ORAL
  Filled 2023-11-24 (×27): qty 1

## 2023-11-24 MED ORDER — HYDROMORPHONE HCL 1 MG/ML IJ SOLN
0.5000 mg | INTRAMUSCULAR | Status: DC | PRN
  Administered 2023-11-25 – 2023-11-27 (×4): 0.5 mg via INTRAVENOUS
  Filled 2023-11-24 (×4): qty 0.5

## 2023-11-24 NOTE — Progress Notes (Signed)
 PROGRESS NOTE    Joseph Haney  ZOX:[id number] DOB: 1976/02/11 DOA: 11/06/2023 PCP: Frederic Jericho, [name]  Chief Complaint  Patient presents with   Shortness of Breath   Weakness    Hospital Course:  Joseph Haney is 48 y.o. 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  He was admitted on 3/7 for pain control.  Palliative care and oncology were consulted.  Patient has required high-dose opioids for adequate pain control.  Currently necessitating Dilaudid PCA with MS Contin.  At this time family would like to continue to pursue all aggressive measures and is not interested in hospice discussions.  Subjective: No acute events overnight.   Objective: Vitals:   11/24/23 0536 11/24/23 0900 11/24/23 1220 11/24/23 2024  BP: 117/67 111/68  113/70  Pulse: 73 76  (!) 101  Resp: 14 13 14 18   Temp: 97.8 F (36.6 C) 98.6 F (37 C)  99 F (37.2 C)  TempSrc: Oral Oral    SpO2: 100% 100% 98% 100%  Weight:      Height:        Intake/Output Summary (Last 24 hours) at 11/24/2023 2049 Last data filed at 11/24/2023 1847 Gross per 24 hour  Intake 570 ml  Output 2327 ml  Net -1757 ml   Filed Weights   11/06/23 1450 11/18/23 0500  Weight: 56.2 kg 87.2 kg     Physical Exam  Constitutional: In no distress. Thin Cardiovascular: Normal rate, regular rhythm. No lower extremity edema  Pulmonary: Non labored breathing on room air, no wheezing or rales.   Abdominal: Soft. Normal bowel sounds. Non distended and non tender Musculoskeletal: Normal range of motion.     Neurological: Alert and oriented to person, place, and time. Non focal  Skin: Skin is warm and dry.    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 Active Problems:   Stage IV carcinoma of colon (HCC)   Goals of care, counseling/discussion    Generalized weakness   Hypokalemia   Protein-calorie malnutrition, severe (HCC)   Malnutrition of moderate degree   High risk medication use   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 [facility])   Need for emotional support   Medication management   Palliative care encounter   Appetite loss   HTN (hypertension)   Pressure injury of skin   Elevated LFTs   Counseling and coordination of care   Drug-induced constipation    Intermittent Hypoxia Improving  --CT chest on arrival w enlarged pulmonary nodules, no acute process - Chest x-ray 3/24 with small focal opacity, will hold on repeat CT chest  - Intermittently requiring 1 to 2 L O2 - Patient's fraility, minimal activity, and shallow respirations are likely playing a role. -- Continue flutter valve, IS --Check procalcitonin   Cancer associated pain - Currently on Dilaudid PCA with MS Contin.   - On Decadron, per oncology  --Radiation per rad oncology  - Continue Celebrex --Dilaudid pca weaned with assistance of palliative - Palliative care consulted for assistance with pain management and ongoing GOC discussions  Stage IV carcinoma of colon, 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, liver, bones - MRIs and PET scans reveal worsening of disease - Multiple GOC conversations with family and patient, at this time they are requesting continued aggressive management - Please see GOC conversation as documented from 3/18 note - Family continues to hope to pursue chemotherapy,  at this time does not have enough functional reserve to pursue this - If patient gains enough strength to ambulate they will attempt to discharge and seek second opinion elsewhere - Oncology following and agrees with the above  Normocytic anemia  Appears stable. Likely in setting of malignancy.  Continue to monitor.   Leukocytosis  In the setting of steroids and malignancy.  Continue to monitor.   Multiple unstageable decubitus ulcers - Wound care was consulted.  Believes  unlikely to heal given difficulty with turning, keeping clean and poor nutrition.  - This further complicates his care.   Generalized weakness - Progressive functional decline from underlying malignancy - Repeat MRIs reveal worsening metastatic spread - Essentially bedbound at home, family does report he was able to intermittently ambulate.  Multiple pressure ulcers present on arrival. -- Encourage  continued PT/OT as tolerated  Hypokalemia Resolved   Left arm swelling, resolved - Secondary to IV infiltration on 3/14.    Drug-induced constipation -  Had BM thsi AM --Secondary to high opioid burden -- Continue current bowel regimen  Elevated LFTs - Secondary to metastasis, continue to monitor on labs.  No acute intervention warranted at this time.  Hypertension - Continue to monitor, titrate as needed  Severe protein calorie malnutrition - Worsening anorexia secondary to malignancy - Continue supplements   DVT prophylaxis: Lovenox   Code Status: Limited: Do not attempt resuscitation (DNR) -DNR-LIMITED -Do Not Intubate/DNI  Family Communication:  Discussed directly with patient and family at bedside  Disposition:  Inpatient still hospitalized for pain control, will discharge to home with [facility] vs hospice when he is able to wean from PCA. Unfortunately, at this point it does not appear the patient will be able to leave the hospital soon.  Have discussed with TOC. CIR/LTAC/SNF not an option due to PCA.  Consultants:  Treatment Team:  Consulting Physician: Malachy Mood, MD  Procedures:  None  Antimicrobials:  Anti-infectives (From admission, onward)    None       Data Reviewed: I have personally reviewed following labs and imaging studies CBC: Recent Labs  Lab 11/19/23 0418 11/23/23 0316  WBC 13.2* 18.8*  HGB 8.5* 8.4*  HCT 27.2* 28.1*  MCV 86.9 89.5  PLT 168 152   Basic Metabolic Panel: Recent Labs  Lab 11/19/23 0418 11/23/23 0316  NA 136 135  K 4.4 3.8  CL  100 101  CO2 26 22  GLUCOSE 114* 136*  BUN 28* 36*  CREATININE 0.55* 0.56*  CALCIUM 8.7* 8.5*   GFR: Estimated Creatinine Clearance: 111.2 mL/min (A) (by C-G formula based on SCr of 0.56 mg/dL (L)). Liver Function Tests: Recent Labs  Lab 11/19/23 0418 11/23/23 0316  AST 126* 98*  ALT 52* 46*  ALKPHOS 1,013* 790*  BILITOT 0.7 0.7  PROT 6.4* 6.4*  ALBUMIN 2.0* 2.1*   CBG: No results for input(s): "GLUCAP" in the last 168 hours.  No results found for this or any previous visit (from the past 240 hours).   Radiology Studies: DG CHEST PORT 1 VIEW Result Date: 11/23/2023 CLINICAL DATA:  Hypoxia EXAM: PORTABLE CHEST 1 VIEW COMPARISON:  Chest x-ray 11/10/2023 FINDINGS: Right chest port catheter tip projects over the SVC. The heart size and mediastinal contours are within normal limits. There is a small focal opacity in the retrocardiac region with slightly spiculated borders, indeterminate. The lungs are otherwise clear. There is no pleural effusion or pneumothorax. The visualized skeletal structures are unremarkable. IMPRESSION: Small focal opacity in the retrocardiac region  with slightly spiculated borders, indeterminate. Recommend further evaluation with chest CT. Electronically Signed   By: Darliss Cheney M.D.   On: 11/23/2023 17:05    Scheduled Meds:  carvedilol  25 mg Oral BID WC   Chlorhexidine Gluconate Cloth  6 each Topical Daily   collagenase   Topical Daily   dexamethasone  4 mg Intravenous Q12H   enoxaparin (LOVENOX) injection  40 mg Subcutaneous Q24H   lactulose  20 g Oral BID   morphine  115 mg Oral Q8H   OLANZapine  5 mg Oral QHS   pantoprazole  40 mg Oral Daily   potassium chloride  20 mEq Oral Daily   Ensure Max Protein  11 oz Oral BID   senna  1 tablet Oral BID   sodium chloride flush  10-40 mL Intracatheter Q12H   Continuous Infusions:   LOS: 18 days  MDM: Patient is high risk for one or more organ failure.  They necessitate ongoing hospitalization for  continued IV therapies and subsequent lab monitoring. Total time spent interpreting labs and vitals, coordinating care amongst consultants and care team members, directly assessing and discussing care with the patient and/or family: 35 min    Marolyn Haller, MD [facility]  To contact the attending physician between 7A-7P please use Epic Chat. To contact the covering physician during after hours 7P-7A, please review Amion.   11/24/2023, 8:49 PM   *This document has been created with the assistance of dictation software. Please excuse typographical errors. *

## 2023-11-24 NOTE — Plan of Care (Signed)
  Problem: Education: Goal: Knowledge of General Education information will improve Description: Including pain rating scale, medication(s)/side effects and non-pharmacologic comfort measures 11/24/2023 0727 by Donia Pounds, RN Outcome: Progressing 11/24/2023 0727 by Donia Pounds, RN Outcome: Progressing   Problem: Health Behavior/Discharge Planning: Goal: Ability to manage health-related needs will improve 11/24/2023 0727 by Donia Pounds, RN Outcome: Progressing 11/24/2023 0727 by Donia Pounds, RN Outcome: Progressing   Problem: Clinical Measurements: Goal: Ability to maintain clinical measurements within normal limits will improve 11/24/2023 0727 by Donia Pounds, RN Outcome: Progressing 11/24/2023 0727 by Donia Pounds, RN Outcome: Progressing Goal: Will remain free from infection 11/24/2023 0727 by Donia Pounds, RN Outcome: Progressing 11/24/2023 0727 by Donia Pounds, RN Outcome: Progressing Goal: Diagnostic test results will improve 11/24/2023 0727 by Donia Pounds, RN Outcome: Progressing 11/24/2023 0727 by Donia Pounds, RN Outcome: Progressing Goal: Respiratory complications will improve 11/24/2023 0727 by Donia Pounds, RN Outcome: Progressing 11/24/2023 0727 by Donia Pounds, RN Outcome: Progressing Goal: Cardiovascular complication will be avoided 11/24/2023 3086 by Donia Pounds, RN Outcome: Progressing 11/24/2023 0727 by Donia Pounds, RN Outcome: Progressing

## 2023-11-24 NOTE — Plan of Care (Signed)

## 2023-11-24 NOTE — Progress Notes (Signed)
 Daily Progress Note   Patient Name: Joseph Haney       Date: 11/24/2023 DOB: 1975-09-26  Age: 48 y.o. MRN#: [id number] Attending Physician: Marolyn Haller, MD Primary Care Physician: Frederic Jericho, [name] Admit Date: 11/06/2023 Length of Stay: 18 days  Reason for Consultation/Follow-up: Establishing goals of care, Non pain symptom management, and Pain control  Subjective:   CC: Discussed pain management at this time.  Palliative medicine team following up regarding symptom management and complex medical decision making..  Subjective:  Reviewed EMR prior to presenting to bedside.  At time of EMR review in past 24 hours patient has received IV Dilaudid via PCA bolus at 0.75 mg x 29 doses.  Patient continues to receive MS Contin 90 mg 3 times daily.  Receiving IV dexamethasone every 12 hours, have adjusted this to be for total of 6 doses to continue titration and then can do once daily oral dexamethasone. As per EMR review, patient and family continue to express desire for patient to "get better" as per EMR review.  Oncology has discussed with patient and family that due to his poor performance status, he is not a candidate for chemotherapy.  Patient still wanting to work with physical therapy.  Have not wanted to proceed with hospice care. Last CMP reviewed noting albumin 2.1 and creatinine 0.56.  Last CBC noted to show leukocytosis of 18.8 though this is in setting of patient receiving steroids.  Presented to bedside to see patient.  Patient remembered this provider from prior interactions.  Patient's son laying at bedside.  Patient able to state that his goal is still to work with physical therapy to "get better".  Discussed working to get patient on oral opioids so that he does not have all the attachments to the PCA so can work with physical therapy more able he.  Patient noted that he had been told he would need to stay on IV Dilaudid PCA.  Reached out to provider who clarified that  that is not necessary and encouraged transition over to orals.  Informed patient of this. Discussed plan to increase MS Contin and transition over to as needed oxycodone for short acting.  Noted would have IV Dilaudid available as breakthrough after oral opioids.  Spent time discussing this management.  Again explained to patient how short acting opioids take about 30 minutes to get into the system so does not need to wait until his pain is 10 out of 10.  Noted can increase short acting based on response.   Spent time answering questions as able.  Noted palliative medicine team will continue to follow along with patient's medical journey.  Discussed care with care team including hospitalist and RN to coordinate care.  Objective:   Vital Signs:  BP 117/67 (BP Location: Left Arm)   Pulse 73   Temp 97.8 F (36.6 C) (Oral)   Resp 13   Ht 5\' 4"  (1.626 m)   Wt 87.2 kg   SpO2 100%   BMI 33.01 kg/m   Physical Exam: General: NAD, alert, cachectic, chronically ill-appearing, frail Cardiovascular: RRR Respiratory: no increased work of breathing noted, not in respiratory distress Neuro: A&Ox4, following commands easily Psych: appropriately answers all questions  Imaging:  I personally reviewed recent imaging.   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  Recent  imaging has shown progression of the disease and patient's bone.  Oncology consulted for recommendations.  Palliative medicine team consulted to assist with symptom management.  Recommendations/Plan: # Complex medical decision making/goals of care:  - Discussed care with patient as detailed above in HPI.  Multiple providers have had multiple conversations about patient's medical status, prognosis, not being a candidate for further cancer  directed therapy based on his poor functional and nutritional status, and discussed possible support moving forward through home hospice.  Patient and family noting continued wish for aggressive interventions such as PT and hopes to "get better".  Palliative medicine team continuing to follow along to engage in conversations as able.  -  Code Status: Limited: Do not attempt resuscitation (DNR) -DNR-LIMITED -Do Not Intubate/DNI   # Symptom management: Patient is receiving these palliative interventions for symptom management with an intent to improve quality of life.   -Pain, severe in setting of metastatic colon cancer with mets to bone, liver, and lung Within the past 24 hours hours, patient has required 21.75 mg of IV Dilaudid for opioid management. Based on OMEs calculated for this dose, which would be approximately 135 OMEs when reducing by 50% for incomplete cross tolerance, will appropriately start patient on the listed regimen below.    -Increase MS Contin to 90 mg every 8 hours.     -Start oxycodone 4 mg every 4 hours as needed   -Start IV Dilaudid 0.5 mg every 3 hours as needed breakthrough after oral oxycodone   -Discontinue IV Dilaudid PCA   -Change IV dexamethasone to 4 mg every 12 hours, total of 6 doses to continue titration downward.  After completion of this would consider starting oral dexamethasone daily.   -Already completed Celebrex 200 mg twice daily for total of 10 doses.   Patient receiving Protonix 40 mg daily.   -Consider use of bisphosphonate    -Completed radiation   -Mood/appetite   -Personally reviewed EKG which noted QTc on 11/09/2023 to be 432   -Continue olanzapine to 5 mg nightly   -Constipation   -Continue senna 1 tab twice daily  # Psychosocial Support:  -Brother, mother, son  -Chaplain following for support  # Discharge Planning: To Be Determined  Discussed with: Patient, patient's son, hospitalist, RN  Thank you for allowing the palliative care team  to participate in the care [name].  Alvester Morin, DO Palliative Care Provider PMT # [phone]  If patient remains symptomatic despite maximum doses, please call PMT at [phone] between 0700 and 1900. Outside of these hours, please call attending, as PMT does not have night coverage.

## 2023-11-25 DIAGNOSIS — Z515 Encounter for palliative care: Secondary | ICD-10-CM | POA: Diagnosis not present

## 2023-11-25 DIAGNOSIS — R4589 Other symptoms and signs involving emotional state: Secondary | ICD-10-CM | POA: Diagnosis not present

## 2023-11-25 DIAGNOSIS — E43 Unspecified severe protein-calorie malnutrition: Secondary | ICD-10-CM | POA: Diagnosis not present

## 2023-11-25 DIAGNOSIS — G893 Neoplasm related pain (acute) (chronic): Secondary | ICD-10-CM

## 2023-11-25 LAB — CBC WITH DIFFERENTIAL/PLATELET
Abs Immature Granulocytes: 2.2 10*3/uL — ABNORMAL HIGH (ref 0.00–0.07)
Basophils Absolute: 0.1 10*3/uL (ref 0.0–0.1)
Basophils Relative: 1 %
Eosinophils Absolute: 0 10*3/uL (ref 0.0–0.5)
Eosinophils Relative: 0 %
HCT: 26.2 % — ABNORMAL LOW (ref 39.0–52.0)
Hemoglobin: 8 g/dL — ABNORMAL LOW (ref 13.0–17.0)
Immature Granulocytes: 16 %
Lymphocytes Relative: 12 %
Lymphs Abs: 1.5 10*3/uL (ref 0.7–4.0)
MCH: 27.2 pg (ref 26.0–34.0)
MCHC: 30.5 g/dL (ref 30.0–36.0)
MCV: 89.1 fL (ref 80.0–100.0)
Monocytes Absolute: 0.5 10*3/uL (ref 0.1–1.0)
Monocytes Relative: 3 %
Neutro Abs: 9.1 10*3/uL — ABNORMAL HIGH (ref 1.7–7.7)
Neutrophils Relative %: 68 %
Platelets: 115 10*3/uL — ABNORMAL LOW (ref 150–400)
RBC: 2.94 MIL/uL — ABNORMAL LOW (ref 4.22–5.81)
RDW: 18.7 % — ABNORMAL HIGH (ref 11.5–15.5)
Smear Review: NORMAL
WBC: 13.4 10*3/uL — ABNORMAL HIGH (ref 4.0–10.5)
nRBC: 5.7 % — ABNORMAL HIGH (ref 0.0–0.2)

## 2023-11-25 LAB — BASIC METABOLIC PANEL
Anion gap: 8 (ref 5–15)
BUN: 27 mg/dL — ABNORMAL HIGH (ref 6–20)
CO2: 28 mmol/L (ref 22–32)
Calcium: 8.7 mg/dL — ABNORMAL LOW (ref 8.9–10.3)
Chloride: 100 mmol/L (ref 98–111)
Creatinine, Ser: 0.52 mg/dL — ABNORMAL LOW (ref 0.61–1.24)
GFR, Estimated: >60 mL/min (ref >60)
Glucose, Bld: 119 mg/dL — ABNORMAL HIGH (ref 70–99)
Potassium: 4.3 mmol/L (ref 3.5–5.1)
Sodium: 136 mmol/L (ref 135–145)

## 2023-11-25 LAB — GLUCOSE, CAPILLARY: Glucose-Capillary: 117 mg/dL — ABNORMAL HIGH (ref 70–99)

## 2023-11-25 LAB — PROCALCITONIN: Procalcitonin: 0.41 ng/mL

## 2023-11-25 NOTE — Progress Notes (Signed)
 PT Cancellation Note  Patient Details Name: Joseph Haney MRN: [id number] DOB: 14-Sep-1975   Cancelled Treatment:    Reason Eval/Treat Not Completed:  Attempted PT tx session-pt declined participation at this time-has some concerns about his pain medicine adjustment and requested to hold on PT until he spoke with MD. Will check back as schedule allows.    Faye Ramsay, PT Acute Rehabilitation  Office: [phone]

## 2023-11-25 NOTE — Plan of Care (Signed)
  Problem: Pain Managment: Goal: General experience of comfort will improve and/or be controlled Outcome: Progressing

## 2023-11-25 NOTE — Plan of Care (Signed)

## 2023-11-25 NOTE — [place] FL2 (Signed)
 [place] MEDICAID FL2 LEVEL OF CARE FORM     IDENTIFICATION  Patient Name: Joseph Haney Birthdate: [birth year] Sex: male Admission Date (Current Location): 11/06/2023  [place] and [place] Number:  Joseph Haney [id number] Facility and Address:  [facility],  [address], [place] [address]      Provider Number: [id number]  Attending Physician Name and Address:  Joseph Haney, Joseph J, DO  Relative Name and Phone Number:  Joseph Haney [phone]    Current Level of Care: SNF Recommended Level of Care: Skilled Nursing Facility Prior Approval Number:    Date Approved/Denied:   PASRR Number: [id number]  Discharge Plan: SNF    Current Diagnoses: Patient Active Problem List   Diagnosis Date Noted   Cancer associated pain 11/25/2023   Drug-induced constipation 11/13/2023   Counseling and coordination of care 11/12/2023   HTN (hypertension) 11/11/2023   Pressure injury of skin 11/11/2023   Elevated LFTs 11/11/2023   Generalized weakness 11/10/2023   High risk medication use 11/10/2023   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 (HCC) 11/10/2023   Need for emotional support 11/10/2023   Goals of care, counseling/discussion 11/10/2023   Medication management 11/10/2023   Palliative care encounter 11/10/2023   Appetite loss 11/10/2023   Malnutrition of moderate degree 11/09/2023   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 difficile diarrhea  05/21/2013   Diarrhea 05/20/2013   Protein-calorie malnutrition, severe (HCC) 05/20/2013   Abnormality of gait 12/08/2012   Neck pain 10/21/2012   Back pain 10/21/2012   Temporomandibular subluxation 07/25/2012    Orientation RESPIRATION BLADDER Height & Weight     Self, Time, Situation, Place  Normal Incontinent, Indwelling catheter Weight: 87.2 kg Height:  5\' 4"  (162.6 cm)  BEHAVIORAL SYMPTOMS/MOOD NEUROLOGICAL BOWEL NUTRITION STATUS      Continent Diet  AMBULATORY STATUS COMMUNICATION OF NEEDS Skin   Extensive Assist Verbally PU Stage and Appropriate Care                       Personal Care Assistance Level of Assistance  Bathing, Dressing Bathing Assistance: Limited assistance   Dressing Assistance: Limited assistance     Functional Limitations Info             SPECIAL CARE FACTORS FREQUENCY  PT (By licensed PT), OT (By licensed OT)     PT Frequency: 5X Weekly OT Frequency: 5X Weekly            Contractures Contractures Info: Not present    Additional Factors Info  Code Status Code Status Info: DNR Limited             Current Medications (11/25/2023):  This is the current hospital active medication list Current Facility-Administered Medications  Medication Dose Route Frequency Provider Last Rate Last Admin   alum & mag hydroxide-simeth (MAALOX/MYLANTA) 200-200-20 MG/5ML suspension 15 mL  15 mL Oral Q4H PRN Lewie Chamber, MD   15 mL at 11/24/23 1628   calcium carbonate (TUMS -  dosed in mg elemental calcium) chewable tablet 400 mg of elemental calcium  2 tablet Oral TID PRN Lewie Chamber, MD   400 mg of elemental calcium at 11/24/23 2233   carvedilol (COREG) tablet 25 mg  25 mg Oral BID WC Rometta Emery, MD   25 mg at 11/25/23 3244   Chlorhexidine Gluconate Cloth 2 % PADS 6 each  6 each Topical Daily Zigmund Daniel., MD   6 each at 11/24/23 1700   collagenase (SANTYL) ointment   Topical Daily Debarah Crape, DO   Given at 11/24/23  1722   dexamethasone (DECADRON) injection 4 mg  4 mg Intravenous Q12H Mims, Lauren W, DO   4 mg at 11/25/23 0946   enoxaparin (LOVENOX) injection 40 mg  40 mg Subcutaneous Q24H Earlie Lou L, MD   40 mg at 11/25/23 0947   HYDROmorphone (DILAUDID) injection 0.5 mg  0.5 mg Intravenous Q3H PRN Alena Bills, DO       lactulose (CHRONULAC) 10 GM/15ML solution 20 g  20 g Oral BID Dezii, Alexandra, DO   20 g at 11/25/23 0946   morphine (MS CONTIN) 12 hr tablet 115 mg  115 mg Oral Q8H Mims, Lauren W, DO   115 mg at 11/25/23 1316   naloxone (NARCAN) injection 0.4 mg  0.4 mg Intravenous PRN Rosalin Hawking, MD       And   sodium chloride flush (NS) 0.9 % injection 9 mL  9 mL Intravenous PRN Rosalin Hawking, MD       OLANZapine (ZYPREXA) tablet 5 mg  5 mg Oral QHS Mims, Lauren W, DO   5 mg at 11/24/23 2201   ondansetron (ZOFRAN) tablet 4 mg  4 mg Oral Q6H PRN Rometta Emery, MD       oxyCODONE (Oxy IR/ROXICODONE) immediate release tablet 5 mg  5 mg Oral Q4H PRN Alena Bills, DO   5 mg at 11/25/23 0313   pantoprazole (PROTONIX) EC tablet 40 mg  40 mg Oral Daily Zigmund Daniel., MD   40 mg at 11/25/23 0946   potassium chloride (KLOR-CON) packet 20 mEq  20 mEq Oral Daily Dezii, Alexandra, DO   20 mEq at 11/25/23 0102   protein supplement (ENSURE MAX) liquid  11 oz Oral BID Lewie Chamber, MD   11 oz at 11/25/23 1009   senna (SENOKOT) tablet 8.6 mg  1 tablet Oral BID Alena Bills, DO   8.6 mg at 11/25/23 0946   sodium chloride flush (NS) 0.9 % injection 10-40 mL  10-40 mL Intracatheter Q12H Lewie Chamber, MD   10 mL at 11/25/23 0947   sodium chloride flush (NS) 0.9 % injection 10-40 mL  10-40 mL Intracatheter PRN Lewie Chamber, MD         Discharge Medications: Please see discharge summary for a list of discharge medications.  Relevant Imaging Results:  Relevant Lab Results:   Additional Information SS#  [id number]  Joseph Foot, RN

## 2023-11-25 NOTE — Plan of Care (Signed)
   Problem: Education: Goal: Knowledge of General Education information will improve Description Including pain rating scale, medication(s)/side effects and non-pharmacologic comfort measures Outcome: Progressing   Problem: Health Behavior/Discharge Planning: Goal: Ability to manage health-related needs will improve Outcome: Progressing

## 2023-11-25 NOTE — Progress Notes (Signed)
 PROGRESS NOTE    Joseph Haney  BJY:[id number] DOB: 17-Jan-1976 DOA: 11/06/2023 PCP: Frederic Jericho, [name]    Brief Narrative:   Joseph Haney is a 48 y.o. male with past medical history significant for metastatic colon cancer to liver, lung, bone, progressive debility/weakness and bedbound since December 2024 presented to [facility] ED on 11/06/2023 for intractable pain of malignancy.  Palliative care, oncology were consulted.  Patient was requiring high dose opiates and subsequently placed on Dilaudid PCA with MS Contin.  Given his progressive disease process, both palliative care and oncology recommended transitioning to hospice.  Patient and family declined and would like to continue to pursue all aggressive measures.  Dilaudid PCA was titrated off on 11/24/2023.  Pain adequately controlled and currently awaiting SNF bed offers.  Assessment & Plan:   Cancer associated pain Patient presenting to ED with intractable pain of malignancy.  Required high doses of opiates and subsequently placed on Dilaudid PCA.  Palliative care was consulted and followed during the hospital course.  PCA was titrated off on 11/24/2023. -- MS Contin 115 mg p.o. every 8 hours -- Oxycodone 5 mg p.o. every 4 hours as needed moderate pain -- Dilaudid 0.5 mg IV every 3 hours as needed breakthrough pain if not relieved with oral oxycodone -- Dexamethasone 4 mg IV every 12 hours -- Lactulose 20 g p.o. 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 Mosetta Putt outpatient.  Patient with repeat imaging studies/PET scans showing progression of disease process.  Seen by Dr. Parke Poisson while inpatient and given his very poor performance status, mobility limitation he is not a candidate for further chemotherapy.  He has completed a short course of palliative radiation during this hospitalization.  Recommends transitioning to hospice but  family and patient declined and would like to continue to pursue all aggressive measures despite oncology making it very clear that he is not a candidate for further chemotherapy at this point. --Outpatient follow-up with medical oncology  Essential hypertension -- Carvedilol 25 mg p.o. twice daily  GERD -- Protonix 40 mg p.o. daily  Mood disorder -- Olanzapine 5 mg p.o. nightly   DVT prophylaxis: enoxaparin (LOVENOX) injection 40 mg Start: 11/07/23 1000    Code Status: Limited: Do not attempt resuscitation (DNR) -DNR-LIMITED -Do Not Intubate/DNI  Family Communication: Updated family present at bedside this morning  Disposition Plan:  Level of care: Med-Surg Status is: Inpatient Remains inpatient appropriate because: Pending SNF placement, medically stable for discharge once bed available    Consultants:  Medical oncology, Dr. Mosetta Putt Palliative care  Procedures:  None  Antimicrobials:  None   Subjective: Patient seen today bedside, lying in bed.  Family present.  Titrate off Dilaudid PCA infusion yesterday.  Pain controlled on oral MS Contin and as needed oxycodone.  No use of IV Dilaudid overnight.  Discussed with palliative care, Dr. Ledell Noss this morning.  Ready for discharge to SNF once bed available, discussed with TOC.  Patient denies headache, no chest pain, no shortness of breath, no abdominal pain, no fever/chills/night sweats, no nausea/vomit/diarrhea.  No acute events overnight per nursing staff.  Objective: Vitals:   11/24/23 2024 11/25/23 0326 11/25/23 0945 11/25/23 1311  BP: 113/70 103/66 114/75 111/65  Pulse: (!) 101 100 98 90  Resp: 18 18  16   Temp: 99 F (37.2 C) 98.3 F (36.8 C)  98 F (36.7 C)  TempSrc:  SpO2: 100% 100% 100% 100%  Weight:      Height:        Intake/Output Summary (Last 24 hours) at 11/25/2023 1602 Last data filed at 11/25/2023 0730 Gross per 24 hour  Intake 240 ml  Output 1701 ml  Net -1461 ml   Filed Weights   11/06/23  1450 11/18/23 0500  Weight: 56.2 kg 87.2 kg    Examination:  Physical Exam: GEN: NAD, alert, chronically ill appearance, appears on stated age HEENT: NCAT, PERRL, EOMI, sclera clear, MMM PULM: CTAB w/o wheezes/crackles, normal respiratory effort, on 1 L nasal cannula with SpO2 100% at rest CV: RRR w/o M/G/R GI: abd soft, NTND, NABS, no R/G/M MSK: no peripheral edema, moves all extremities independently NEURO: No focal neurological deficit PSYCH: normal mood/affect Integumentary: No concerning rashes/lesions/wounds noted on exposed skin surfaces    Data Reviewed: I have personally reviewed following labs and imaging studies  CBC: Recent Labs  Lab 11/19/23 0418 11/23/23 0316 11/25/23 0457  WBC 13.2* 18.8* 13.4*  NEUTROABS  --   --  9.1*  HGB 8.5* 8.4* 8.0*  HCT 27.2* 28.1* 26.2*  MCV 86.9 89.5 89.1  PLT 168 152 115*   Basic Metabolic Panel: Recent Labs  Lab 11/19/23 0418 11/23/23 0316 11/25/23 0457  NA 136 135 136  K 4.4 3.8 4.3  CL 100 101 100  CO2 26 22 28   GLUCOSE 114* 136* 119*  BUN 28* 36* 27*  CREATININE 0.55* 0.56* 0.52*  CALCIUM 8.7* 8.5* 8.7*   GFR: Estimated Creatinine Clearance: 111.2 mL/min (A) (by C-G formula based on SCr of 0.52 mg/dL (L)). Liver Function Tests: Recent Labs  Lab 11/19/23 0418 11/23/23 0316  AST 126* 98*  ALT 52* 46*  ALKPHOS 1,013* 790*  BILITOT 0.7 0.7  PROT 6.4* 6.4*  ALBUMIN 2.0* 2.1*   No results for input(s): "LIPASE", "AMYLASE" in the last 168 hours. No results for input(s): "AMMONIA" in the last 168 hours. Coagulation Profile: No results for input(s): "INR", "PROTIME" in the last 168 hours. Cardiac Enzymes: No results for input(s): "CKTOTAL", "CKMB", "CKMBINDEX", "TROPONINI" in the last 168 hours. BNP (last 3 results) No results for input(s): "PROBNP" in the last 8760 hours. HbA1C: No results for input(s): "HGBA1C" in the last 72 hours. CBG: No results for input(s): "GLUCAP" in the last 168 hours. Lipid  Profile: No results for input(s): "CHOL", "HDL", "LDLCALC", "TRIG", "CHOLHDL", "LDLDIRECT" in the last 72 hours. Thyroid Function Tests: No results for input(s): "TSH", "T4TOTAL", "FREET4", "T3FREE", "THYROIDAB" in the last 72 hours. Anemia Panel: No results for input(s): "VITAMINB12", "FOLATE", "FERRITIN", "TIBC", "IRON", "RETICCTPCT" in the last 72 hours. Sepsis Labs: Recent Labs  Lab 11/25/23 0457  PROCALCITON 0.41    No results found for this or any previous visit (from the past 240 hours).       Radiology Studies: No results found.      Scheduled Meds:  carvedilol  25 mg Oral BID WC   Chlorhexidine Gluconate Cloth  6 each Topical Daily   collagenase   Topical Daily   dexamethasone  4 mg Intravenous Q12H   enoxaparin (LOVENOX) injection  40 mg Subcutaneous Q24H   lactulose  20 g Oral BID   morphine  115 mg Oral Q8H   OLANZapine  5 mg Oral QHS   pantoprazole  40 mg Oral Daily   potassium chloride  20 mEq Oral Daily   Ensure Max Protein  11 oz Oral BID   senna  1 tablet Oral BID  sodium chloride flush  10-40 mL Intracatheter Q12H   Continuous Infusions:   LOS: 19 days    Time spent: 48 minutes spent on 11/25/2023 caring for this patient face-to-face including chart review, ordering labs/tests, documenting, discussion with nursing staff, consultants, updating family and interview/physical exam    Alvira Philips [place], DO [facility] Available via Epic secure chat 7am-7pm After these hours, please refer to coverage provider listed on [email or web address] 11/25/2023, 4:02 PM

## 2023-11-25 NOTE — TOC Progression Note (Signed)
 Transition of Care [facility]) - Progression Note    Patient Details  Name: Joseph Haney MRN: [id number] Date of Birth: [birth year]  Transition of [facility]) CM/SW Contact  Jessie Foot, RN Phone Number: 11/25/2023, 3:30 PM  Clinical Narrative:    SNF workup started. PASSR, FL2 and waiting on bed offer     Barriers to Discharge: Continued Medical Work up  Expected Discharge Plan and Services                                               Social Determinants of Health (SDOH) Interventions SDOH Screenings   Food Insecurity: No Food Insecurity (11/07/2023)  Housing: High Risk (11/07/2023)  Transportation Needs: Unmet Transportation Needs (11/07/2023)  Utilities: Not At Risk (11/07/2023)  Social Connections: Unknown (11/07/2023)  Tobacco Use: Low Risk  (11/08/2023)    Readmission Risk Interventions    11/09/2023    9:44 AM 11/07/2023    2:14 PM  Readmission Risk Prevention Plan  Post Dischage Appt  Complete  Medication Screening  Complete  Transportation Screening Complete Complete  HRI or Home Care Consult Complete   Social Work Consult for Recovery Care Planning/Counseling Complete   Palliative Care Screening Not Applicable   Medication Review Oceanographer) Complete

## 2023-11-25 NOTE — Progress Notes (Signed)
 Daily Progress Note   Patient Name: Joseph Haney       Date: 11/25/2023 DOB: Dec 01, 1975  Age: 48 y.o. MRN#: [id number] Attending Physician: [place], Eric J, DO Primary Care Physician: Joseph Jericho, [name] Admit Date: 11/06/2023 Length of Stay: 19 days  Reason for Consultation/Follow-up: Establishing goals of care, Non pain symptom management, and Pain control  Subjective:   CC: Patient noting he feels better today.  Palliative medicine team following up regarding symptom management and complex medical decision making..  Subjective:  Reviewed EMR prior to presenting to bedside.  At time of EMR review in past 24 hours patient has received oxycodone 5 mg as needed x 2 doses.  Patient was started on MS Contin 115 mg 3 times daily on 11/24/2023 and IV Dilaudid PCA was discontinued at that time. Discussed care with hospitalist for updates.  PT has already recommended skilled nursing facility for rehab.  Presented to bedside to see patient.  No family present at bedside.  Able to follow-up with patient regarding his pain today.  Patient notes that he is feeling "better" and feels that the pain medications are assisting with his pain management.  Patient unable to define how long the as needed oxycodone last though feels it is "working".  Noted patient can monitor so adjustments can be made if needed.  Patient agreeing he wants to continue working with physical therapy.  Noted continued use of pain medications to assist with this. Also discussed that patient does not need to be on CO2 monitoring since not on IV Dilaudid PCA anymore.  Noted would discussed with RN for evaluation of need of oxygen or if this can be discontinued.  Spent time answering questions and providing emotional support reactive listening.  Noted palliative medicine team continue to follow along with patient's medical journey.  Discussed care with patient's IDT after visit including hospitalist, RN, TOC to coordinate  care.  Objective:   Vital Signs:  BP 103/66 (BP Location: Left Arm)   Pulse 100   Temp 98.3 F (36.8 C)   Resp 18   Ht 5\' 4"  (1.626 m)   Wt 87.2 kg   SpO2 100%   BMI 33.01 kg/m   Physical Exam: General: NAD, alert, cachectic, chronically ill-appearing, frail Cardiovascular: RRR Respiratory: no increased work of breathing noted, not in respiratory distress Neuro: A&Ox4, following commands easily Psych: appropriately answers all questions  Imaging:  I personally reviewed recent imaging.   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  Recent imaging has shown progression of the disease and patient's bone.  Oncology consulted for recommendations.  Palliative medicine team consulted to assist with symptom management.  Recommendations/Plan: # Complex medical decision making/goals of care:  - Discussed care with patient as detailed above in HPI.  Multiple providers have had multiple conversations about patient's medical status, prognosis, not being a candidate for further cancer directed therapy based on his poor functional and nutritional status, and discussed possible support moving forward through home hospice.  Patient and family noting continued wish for aggressive interventions such as PT and hopes to "get better".  Palliative medicine team continuing to follow along to engage in conversations as able.  -  Code Status: Limited: Do not attempt resuscitation (DNR) -DNR-LIMITED -Do Not Intubate/DNI   # Symptom management: Patient is receiving these palliative  interventions for symptom management with an intent to improve quality of life.   -Pain, acute on chronic in setting of metastatic colon cancer with mets to bone, liver, and lung   -Continue MS Contin to 115 mg every 8 hours.      -Continue oxycodone 4 mg every 4 hours as needed   -Continue IV Dilaudid 0.5 mg every 3 hours as needed breakthrough after oral oxycodone   -Change IV dexamethasone to 4 mg every 12 hours, total of 6 doses to continue titration downward.  After completion of this would consider starting oral dexamethasone daily.   -Already completed Celebrex 200 mg twice daily for total of 10 doses.   Patient receiving Protonix 40 mg daily.   -Consider use of bisphosphonate    -Completed radiation   -Mood/appetite   -Personally reviewed EKG which noted QTc on 11/09/2023 to be 432   -Continue olanzapine to 5 mg nightly   -Constipation   -Continue senna 1 tab twice daily  # Psychosocial Support:  -Brother, mother, son  -Orthoptist following for support  # Discharge Planning: Skilled Nursing Facility for rehab   Discussed with: Patient, hospitalist, RN, Joseph Haney  Thank you for allowing the palliative care team to participate in the care Joseph Maduro.  Joseph Morin, DO Palliative Care Provider PMT # [phone]  If patient remains symptomatic despite maximum doses, please call PMT at [phone] between 0700 and 1900. Outside of these hours, please call attending, as PMT does not have night coverage.

## 2023-11-26 DIAGNOSIS — G893 Neoplasm related pain (acute) (chronic): Secondary | ICD-10-CM | POA: Diagnosis not present

## 2023-11-26 MED ORDER — OXYCODONE HCL 5 MG PO TABS
5.0000 mg | ORAL_TABLET | ORAL | Status: DC | PRN
  Administered 2023-11-26 – 2023-11-27 (×3): 10 mg via ORAL
  Filled 2023-11-26 (×3): qty 2

## 2023-11-26 MED ORDER — DEXAMETHASONE 4 MG PO TABS
2.0000 mg | ORAL_TABLET | Freq: Every day | ORAL | Status: AC
  Administered 2023-11-27 – 2023-11-29 (×3): 2 mg via ORAL
  Filled 2023-11-26 (×3): qty 1

## 2023-11-26 NOTE — Progress Notes (Signed)
 Occupational Therapy Treatment Patient Details Name: Joseph Haney MRN: [id number] DOB: 08/04/1976 Today's Date: 11/26/2023   History of present illness Joseph Haney is a 48 y.o. male admitted with cancer associated pain. MRI 11/11/23 1. Diffuse osseous metastatic disease throughout the lumbar spine, lower thoracic spine, and included posterior pelvis, 2. Slight bulging of the posterior walls of the T12 through L3 vertebral bodies with a small amount of tumor extending into the anterior epidural spaces at these levels, 3. Subtle superior endplate compression fracture of the L1 and L2 vertebral bodies. PMH: colon cancer (s/p partial colectomy 2022), liver and pelvic bone metastasis   OT comments  Patient seen for skilled OT session this afternoon. Patient with family bedside and resting up in recliner. Patient on RA and sats the entire visit between 98-100%. Patient now off PCA and no increase in baseline 8/10 pain in back patient has reported each OT session this clinician has worked with patient with various new activities presented. See below re: activity tolerance with light UE therex, ADL's and seated push ups for pressure releif. Patient''s STG's upgraded based on progress as OT appreciated PT note from session prior on and off commode with + 2 assist with STEDY and patient continues to require Acute hospital setting skilled OT. Patient will benefit from continued inpatient follow up therapy, <3 hours/day.        If plan is discharge home, recommend the following:  Two people to help with walking and/or transfers;Two people to help with bathing/dressing/bathroom;Direct supervision/assist for medications management;Assist for transportation;Direct supervision/assist for financial management;Help with stairs or ramp for entrance   Equipment Recommendations  BSC/3in1;Tub/shower bench;Hospital bed;Hoyer lift       Precautions / Restrictions Precautions Precautions: Fall Recall of  Precautions/Restrictions: Intact Restrictions Weight Bearing Restrictions Per Provider Order: No Other Position/Activity Restrictions: pressure releifs completed as pt in recliner and has sacral pu       Mobility Bed Mobility Overal bed mobility:  (already up in recliner)                  Transfers Overall transfer level: Needs assistance   Transfers: Sit to/from Stand Sit to Stand: Total assist, +2 physical assistance, +2 safety/equipment, Via lift equipment           General transfer comment: see above- pt up in recliner this session     Balance Overall balance assessment: Needs assistance Sitting-balance support: Feet supported, Single extremity supported Sitting balance-Leahy Scale: Poor     Standing balance support: Bilateral upper extremity supported, Reliant on assistive device for balance Standing balance-Leahy Scale: Zero                             ADL either performed or assessed with clinical judgement   ADL Overall ADL's : Needs assistance/impaired Eating/Feeding: Set up;Sitting   Grooming: Wash/dry hands;Oral care;Applying deodorant;Minimal assistance;Sitting Grooming Details (indicate cue type and reason): recliner level Upper Body Bathing: Minimal assistance;Sitting Upper Body Bathing Details (indicate cue type and reason): recliner level             Toilet Transfer: +2 for physical assistance;+2 for safety/equipment;Total assistance (seated push ups 5 reps x 4 sets to work toward toileting with STEDY on commode)             General ADL Comments: significant improvement in SpO2 sats during BADL's up in recliner this session 98-100% on RA    Extremity/Trunk Assessment Upper Extremity Assessment Upper  Extremity Assessment: Generalized weakness   Lower Extremity Assessment Lower Extremity Assessment: Defer to PT evaluation        Vision   Vision Assessment?: No apparent visual deficits   Perception  Perception Perception: Within Functional Limits   Praxis Praxis Praxis: WFL   Communication Communication Communication: Impaired Factors Affecting Communication:  (slower processing and occasion SOB this session)   Cognition Arousal: Alert Behavior During Therapy: WFL for tasks assessed/performed                                 Following commands: Impaired Following commands impaired: Follows one step commands with increased time               General Comments improved SpO2 98-100% on RA, seated push ups for pressure releifs, IS 10 reps x 3, ballooon toss for endurance progression 6 sets of 10 reps Bly with rests in between and min cues for breathing techniques for recovery rests    Pertinent Vitals/ Pain       Pain Assessment Pain Assessment: Faces Faces Pain Scale: Hurts whole lot Pain Location: back Pain Descriptors / Indicators: Constant, Discomfort Pain Intervention(s): Monitored during session, Repositioned, Relaxation   Frequency  Min 2X/week        Progress Toward Goals  OT Goals(current goals can now be found in the care plan section)  Progress towards OT goals: Progressing toward goals (STG's updated this session)  Acute Rehab OT Goals Patient Stated Goal: to keep progressing OT Goal Formulation: With patient/family Time For Goal Achievement: 12/10/23 Potential to Achieve Goals: Fair ADL Goals Pt Will Perform Grooming: with contact guard assist;sitting Pt Will Perform Upper Body Bathing: with contact guard assist;sitting Pt Will Transfer to Toilet: with +2 assist;bedside commode (using STEDY) Additional ADL Goal #1: Pt will transition from supine to sitting EOB with Max A in order to participate in self care task while seted EOB and progress OOB activity.   AM-PAC OT "6 Clicks" Daily Activity     Outcome Measure   Help from another person eating meals?: A Little Help from another person taking care of personal grooming?: A  Little Help from another person toileting, which includes using toliet, bedpan, or urinal?: A Lot Help from another person bathing (including washing, rinsing, drying)?: Total Help from another person to put on and taking off regular upper body clothing?: A Lot Help from another person to put on and taking off regular lower body clothing?: Total 6 Click Score: 12    End of Session Equipment Utilized During Treatment: Oxygen  OT Visit Diagnosis: Unsteadiness on feet (R26.81);Muscle weakness (generalized) (M62.81);Pain   Activity Tolerance Patient limited by fatigue;Patient limited by pain   Patient Left in chair;with call bell/phone within reach;with chair alarm set;with family/visitor present   Nurse Communication  (pt already up in recliner- rec position changes and instructed family on recliner use/operation)        Time: 1425-1510 OT Time Calculation (min): 45 min  Charges: OT General Charges $OT Visit: 1 Visit OT Treatments $Therapeutic Activity: 38-52 mins  Valynn Schamberger OT/L Acute Rehabilitation Department  [phone]  11/26/2023, 3:31 PM

## 2023-11-26 NOTE — TOC Progression Note (Addendum)
 Transition of Care [facility]) - Progression Note    Patient Details  Name: Joseph Haney MRN: [id number] Date of Birth: [birth year]  Transition of [facility]) CM/SW Contact  Jessie Foot, RN Phone Number: 11/26/2023, 9:26 AM  Clinical Narrative:    Choice List Given Nadara Mustard Rm 1529  [facility], [facility] Preferred SNF Pending - Request Sent -- [address], [place] [address] [phone] --**  [facility] Pending - Request Sent -- [address], [place] [address] [phone] --**  [facility] Pending - Request Sent -- [address] [place], [place] [address] [phone] --**  [facility] Pending - Request Sent -- [address] [place] [address] [phone] --*  [facility] Preferred SNF Pending - Request Sent -- [address] [employer], [place] [address] [phone] --        Barriers to Discharge: Continued Medical Work up  Expected Discharge Plan and Services                                               Social Determinants of Health (SDOH) Interventions SDOH Screenings   Food Insecurity: No Food Insecurity (11/07/2023)  Housing: High Risk (11/07/2023)  Transportation Needs: Unmet Transportation Needs (11/07/2023)  Utilities: Not At Risk (11/07/2023)  Social Connections: Unknown (11/07/2023)  Tobacco Use: Low Risk  (11/08/2023)    Readmission Risk Interventions    11/09/2023    9:44 AM 11/07/2023    2:14 PM  Readmission Risk Prevention Plan  Post Dischage Appt  Complete  Medication Screening  Complete  Transportation Screening Complete Complete  HRI or Home Care Consult Complete   Social Work Consult for Recovery Care Planning/Counseling Complete   Palliative Care Screening Not Applicable   Medication Review Oceanographer) Complete

## 2023-11-26 NOTE — Progress Notes (Signed)
 Daily Progress Note   Patient Name: Joseph Haney       Date: 11/26/2023 DOB: 16-Feb-1976  Age: 48 y.o. MRN#: [id number] Attending Physician: [place], Eric J, DO Primary Care Physician: Frederic Jericho, [name] Admit Date: 11/06/2023 Length of Stay: 20 days  Reason for Consultation/Follow-up: Establishing goals of care, Non pain symptom management, and Pain control  Subjective:   CC: Patient seen sitting up in bedside chair today.  Palliative medicine team following up regarding symptom management and complex medical decision making..  Subjective:  Reviewed EMR prior to presenting to bedside.  At time of EMR review in past 24 hours patient has received as needed oxycodone 5 mg x 3 doses and as needed IV Dilaudid 0.5 mg x 2 doses.  Patient continues to receive MS Contin 150 mg 3 times daily.  Patient will complete IV dexamethasone today; continuing to wean. Reviewed EMR that patient declined participation with physical therapy on 11/25/2023 due to concerns about his pain medicine adjustment and requested to PT until he spoke with physician.  Presented to bedside to see patient in the morning.  Patient seen sitting up in bedside chair.  Patient's sister present at bedside.  Patient noted that PT was able to assist him with moving to the chair today which she was happy about.  Spent time discussing patient's pain regimen.  Patient does feel the oxycodone is assisting with his pain though since he is still needing IV medication, we discussed increasing this range to 5-10 mg every 4 hours as needed.  Again spent time counseling need to take 30-45 minutes prior to working with physical therapy.  Again confirmed that patient is continuing to get long-acting medication MS Contin 115 mg 3 times daily.  Inquired why patient did not work with physical therapy yesterday.  Patient was not able to be specific about that at this time.  Spent extensive time counseling patient on need to participate with  physical therapy if his goal is rehab.  Patient has continued to state that his goal is to "get stronger" and patient is not accepting of hospice.  Discussed how refusing to work with physical therapy can be detrimental to patient's stated goals for himself.  Patient acknowledged this. All questions answered at that time.  Returned later in afternoon when RN noted patient wanted to discuss feeling "jittery".  Presented to bedside to see patient.  Multiple family members present at bedside.  Again reviewed that patient is continuing to get opioids.  Discussed that patient is receiving equivalent doses to what he was getting on the pump and that this change to oral medication would not precipitate withdrawal.  Patient has continued to get large doses of opioids orally.  Patient and family acknowledged this.  We again discussed importance of patient taking medication as needed to work with physical therapy if his ultimate goal is to regain his strength. All questions answered at that time.  Discussed care with IDT during the day including hospitalist, TOC, and RN.  Objective:   Vital Signs:  BP 121/68 (BP Location: Right Arm)   Pulse 81   Temp 98.2 F (36.8 C)   Resp 18   Ht 5\' 4"  (1.626 m)   Wt 87.2 kg   SpO2 100%   BMI 33.01 kg/m   Physical Exam: General: NAD, alert, cachectic, chronically ill-appearing, frail Cardiovascular: RRR Respiratory: no increased work of breathing noted, not in respiratory distress Neuro: A&Ox4, following commands easily Psych: appropriately answers all questions  Imaging:  I personally reviewed recent imaging.   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  Recent imaging has shown progression of the disease and patient's bone.  Oncology consulted for  recommendations.  Palliative medicine team consulted to assist with symptom management.  Recommendations/Plan: # Complex medical decision making/goals of care:  - Discussed care with patient with family present at bedside as detailed above in HPI.  Multiple providers have had multiple conversations about patient's medical status, prognosis, not being a candidate for further cancer directed therapy based on his poor functional and nutritional status, and discussed possible support moving forward through home hospice.  Patient and family noting continued wish for aggressive interventions such as PT and hopes to "get better".  Again spent time counseling patient on need to participate with therapy if his ultimate goal is to "get stronger".  Palliative medicine team continuing to follow along to engage in conversations as able.  -  Code Status: Limited: Do not attempt resuscitation (DNR) -DNR-LIMITED -Do Not Intubate/DNI   # Symptom management: Patient is receiving these palliative interventions for symptom management with an intent to improve quality of life.   -Pain, acute on chronic in setting of metastatic colon cancer with mets to bone, liver, and lung   -Continue MS Contin to 115 mg every 8 hours.     -Change oxycodone to 5-10 mg every 4 hours as needed   -Continue IV Dilaudid 0.5 mg every 3 hours as needed breakthrough after oral oxycodone   -Complete IV dexamethasone today.  Start dexamethasone 2 mg daily on 11/27/2023 for 3 doses to complete taper.   -Already completed Celebrex 200 mg twice daily for total of 10 doses.   Patient receiving Protonix 40 mg daily.   -Consider use of bisphosphonate    -Completed radiation   -Mood/appetite   -Personally reviewed EKG which noted QTc on 11/09/2023 to be 432   -Continue olanzapine to 5 mg nightly   -Constipation   -Continue senna 1 tab twice daily  # Psychosocial Support:  -Brother, mother, son  -Orthoptist following for support  # Discharge  Planning: Skilled Nursing Facility for rehab   Discussed with: Patient, patient's family at bedside, hospitalist, RN, [facility]  Thank you for allowing the palliative care team to participate in the care [name].  Alvester Morin, DO Palliative Care Provider PMT # [phone]  If patient remains symptomatic despite maximum doses, please call PMT at [phone] between 0700 and 1900. Outside of these hours, please call attending, as PMT does not have night coverage.  Personally spent 45 minutes in patient care including extensive chart review (labs, imaging, progress/consult notes, vital signs), medically appropraite exam, discussed with treatment team, education to patient, family, and staff, documenting clinical information, medication review and management, coordination of care, and available advanced directive documents.

## 2023-11-26 NOTE — Plan of Care (Signed)
   Problem: Education: Goal: Knowledge of General Education information will improve Description Including pain rating scale, medication(s)/side effects and non-pharmacologic comfort measures Outcome: Progressing   Problem: Health Behavior/Discharge Planning: Goal: Ability to manage health-related needs will improve Outcome: Progressing

## 2023-11-26 NOTE — Progress Notes (Signed)
 Physical Therapy Treatment Patient Details Name: Joseph Haney MRN: [id number] DOB: February 20, 1976 Today's Date: 11/26/2023   History of Present Illness Joseph Haney is a 48 y.o. male admitted with cancer associated pain. MRI 11/11/23 1. Diffuse osseous metastatic disease throughout the lumbar spine, lower thoracic spine, and included posterior pelvis, 2. Slight bulging of the posterior walls of the T12 through L3 vertebral bodies with a small amount of tumor extending into the anterior epidural spaces at these levels, 3. Subtle superior endplate compression fracture of the L1 and L2 vertebral bodies. PMH: colon cancer (s/p partial colectomy 2022), liver and pelvic bone metastasis    PT Comments  Pt is NOT progressing with his mobility. Still present with B LE paraplegia. Unable to move B LE.  Per chart review, Imagining " Bulging of the posterior walls of the majority of the thoracic vertebral bodies with a small amount of enhancing tumor extending into the anterior epidural spaces. This is most pronounced at the T4 level on the right where a expansile lesion centered in the right aspect of the vertebral body and pedicle abuts the thoracic cord " Assisted to EOB, required Max Assist + 2 with heavy lean/use B UE's to support self sitting EOB.  Total sitting LOB when unsupported.  Used STEDY to assist from bed to [facility] for a BM then to recliner.  Caution to prevent LOB while in STEDY required Min Assist foir trunk stability.  Assisted with peri care after BM.  Present is a stage 3 sacral ulcer.   Assisted to recliner and positioned with multiple pillows.   Pt will need ST Rehab at SNF to address mobility and functional decline prior to safely returning home.    If plan is discharge home, recommend the following: Two people to help with walking and/or transfers;A lot of help with bathing/dressing/bathroom;Assistance with cooking/housework;Assist for transportation   Can travel by private vehicle      No  Equipment Recommendations       Recommendations for Other Services       Precautions / Restrictions Precautions Precautions: Fall Precaution/Restrictions Comments: Log rolled during bed mobility for comfort (spinal METS/comp Fx's) Restrictions Weight Bearing Restrictions Per Provider Order: No     Mobility  Bed Mobility Overal bed mobility: Needs Assistance Bed Mobility: Sidelying to Sit   Sidelying to sit: +2 for physical assistance, +2 for safety/equipment, Max assist, HOB elevated, Used rails, Total assist       General bed mobility comments: max A+2 for supine to sit through log rolling for comfort, heavy cues for hand placement and sequencing, no active LE movement, limited BUE assist to power up to sitting    Transfers Overall transfer level: Needs assistance   Transfers: Sit to/from Stand Sit to Stand: Total assist, +2 physical assistance, +2 safety/equipment, Via lift equipment           General transfer comment: pt present with 0/5 quad strength.  Used STEDY with pillow at knees to stand.  Had to use a bed sheet around pt's low back to assist with forward weight shift as Pt presented with NO active assist.  Profound Paraperesis.  Assisted to [facility].  Pt sat on BSC x 5 min.  He states he can "feel" when he needs to have a BM but was found incont in bed of bowel.   Used STEDY to assist from [facility] to recliner.  Again, profound B LE weakness.  Unable to achieve upright posture even in STEDY.  Positioned in recliner with  multiple pillows for comfort.    Ambulation/Gait               General Gait Details: non amb   Stairs             Wheelchair Mobility     Tilt Bed    Modified Rankin (Stroke Patients Only)       Balance                                            Communication Communication Communication: Impaired  Cognition Arousal: Alert Behavior During Therapy: WFL for tasks assessed/performed   PT - Cognitive  impairments: Initiation, Sequencing, Problem solving                       PT - Cognition Comments: Pt is always willing with his Therapy session.  However, Pt has poor insight to his medical severity.  Pt is hopeful to be able to walk again. Following commands: Impaired Following commands impaired: Follows one step commands with increased time    Cueing    Exercises      General Comments        Pertinent Vitals/Pain Pain Assessment Pain Assessment: Faces Faces Pain Scale: Hurts a little bit Pain Location: back Pain Descriptors / Indicators: Constant, Discomfort Pain Intervention(s): Monitored during session, Repositioned    Home Living                          Prior Function            PT Goals (current goals can now be found in the care plan section) Progress towards PT goals: Progressing toward goals    Frequency    Min 2X/week      PT Plan      Co-evaluation              AM-PAC PT "6 Clicks" Mobility   Outcome Measure  Help needed turning from your back to your side while in a flat bed without using bedrails?: Total Help needed moving from lying on your back to sitting on the side of a flat bed without using bedrails?: Total Help needed moving to and from a bed to a chair (including a wheelchair)?: Total Help needed standing up from a chair using your arms (e.g., wheelchair or bedside chair)?: Total Help needed to walk in hospital room?: Total Help needed climbing 3-5 steps with a railing? : Total 6 Click Score: 6    End of Session Equipment Utilized During Treatment: Gait belt Activity Tolerance: Patient limited by fatigue Patient left: in chair;with call bell/phone within reach;with chair alarm set Nurse Communication: Mobility status;Need for lift equipment PT Visit Diagnosis: Other abnormalities of gait and mobility (R26.89);Muscle weakness (generalized) (M62.81);Difficulty in walking, not elsewhere classified  (R26.2);Pain     Time: 1020-1048 PT Time Calculation (min) (ACUTE ONLY): 28 min  Charges:    $Therapeutic Activity: 23-37 mins PT General Charges $$ ACUTE PT VISIT: 1 Visit                     Felecia Shelling  PTA Acute  Rehabilitation Services Office M-F          [phone]

## 2023-11-26 NOTE — Progress Notes (Signed)
 PROGRESS NOTE    Joseph Haney  ZOX:[id number] DOB: 04/22/1976 DOA: 11/06/2023 PCP: Frederic Jericho, [name]    Brief Narrative:   Joseph Haney is a 48 y.o. male with past medical history significant for metastatic colon cancer to liver, lung, bone, progressive debility/weakness and bedbound since December 2024 presented to [facility] ED on 11/06/2023 for intractable pain of malignancy.  Palliative care, oncology were consulted.  Patient was requiring high dose opiates and subsequently placed on Dilaudid PCA with MS Contin.  Given his progressive disease process, both palliative care and oncology recommended transitioning to hospice.  Patient and family declined and would like to continue to pursue all aggressive measures.  Dilaudid PCA was titrated off on 11/24/2023.  Pain adequately controlled and currently awaiting SNF bed offers.  Assessment & Plan:   Cancer associated pain Patient presenting to ED with intractable pain of malignancy.  Required high doses of opiates and subsequently placed on Dilaudid PCA.  Palliative care was consulted and followed during the hospital course.  PCA was titrated off on 11/24/2023. -- MS Contin 115 mg p.o. every 8 hours -- Oxycodone 5-10 mg p.o. every 4 hours as needed moderate pain -- Dilaudid 0.5 mg IV every 3 hours as needed breakthrough pain if not relieved with oral oxycodone -- Dexamethasone 2 mg PO daily -- Lactulose 20 g p.o. 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 Mosetta Putt outpatient.  Patient with repeat imaging studies/PET scans showing progression of disease process.  Seen by Dr. Parke Poisson while inpatient and given his very poor performance status, mobility limitation he is not a candidate for further chemotherapy.  He has completed a short course of palliative radiation during this hospitalization.  Recommends transitioning to hospice but  family and patient declined and would like to continue to pursue all aggressive measures despite oncology making it very clear that he is not a candidate for further chemotherapy at this point. --Outpatient follow-up with medical oncology  Essential hypertension -- Carvedilol 25 mg p.o. twice daily  GERD -- Protonix 40 mg p.o. daily  Mood disorder -- Olanzapine 5 mg p.o. nightly   DVT prophylaxis: enoxaparin (LOVENOX) injection 40 mg Start: 11/07/23 1000    Code Status: Limited: Do not attempt resuscitation (DNR) -DNR-LIMITED -Do Not Intubate/DNI  Family Communication: No family present bedside this morning  Disposition Plan:  Level of care: Med-Surg Status is: Inpatient Remains inpatient appropriate because: Pending SNF placement, medically stable for discharge once bed available    Consultants:  Medical oncology, Dr. Mosetta Putt Palliative care  Procedures:  None  Antimicrobials:  None   Subjective: Patient seen today bedside, lying in bed.  No family present.  Patient reporting that he needs to be "more optimized" before discharge, when asked what that means to him he states "the man downstairs told me I may need more optimization".  When asked if this was regarding radiation, he endorsed yes and discussed with him that he completed 5 sessions of radiation and that if further were needed this can be done on an outpatient basis.  Remains on oral pain medications, tolerating with good control.  Patient denies headache, no chest pain, no shortness of breath, no abdominal pain, no fever/chills/night sweats, no nausea/vomit/diarrhea.  No acute events overnight per nursing staff.  Medically stable for discharge to SNF once bed available  Objective: Vitals:   11/25/23 1736 11/25/23 2019 11/26/23 8119 11/26/23 1478  BP: 107/66 113/69 121/68 112/67  Pulse: 82 86 81 83  Resp:  18 18   Temp:  97.8 F (36.6 C) 98.2 F (36.8 C) 97.8 F (36.6 C)  TempSrc:    Oral  SpO2:  98% 100%  100%  Weight:      Height:        Intake/Output Summary (Last 24 hours) at 11/26/2023 1146 Last data filed at 11/26/2023 0600 Gross per 24 hour  Intake 360 ml  Output 1150 ml  Net -790 ml   Filed Weights   11/06/23 1450 11/18/23 0500  Weight: 56.2 kg 87.2 kg    Examination:  Physical Exam: GEN: NAD, alert, chronically ill appearance, appears older than stated age HEENT: NCAT, PERRL, EOMI, sclera clear, MMM PULM: CTAB w/o wheezes/crackles, normal respiratory effort, on room air with SpO2 100% at rest.  CV: RRR w/o M/G/R GI: abd soft, NTND, NABS, no R/G/M MSK: no peripheral edema, moves all extremities independently NEURO: No focal neurological deficit PSYCH: normal mood/affect Integumentary: No concerning rashes/lesions/wounds noted on exposed skin surfaces    Data Reviewed: I have personally reviewed following labs and imaging studies  CBC: Recent Labs  Lab 11/23/23 0316 11/25/23 0457  WBC 18.8* 13.4*  NEUTROABS  --  9.1*  HGB 8.4* 8.0*  HCT 28.1* 26.2*  MCV 89.5 89.1  PLT 152 115*   Basic Metabolic Panel: Recent Labs  Lab 11/23/23 0316 11/25/23 0457  NA 135 136  K 3.8 4.3  CL 101 100  CO2 22 28  GLUCOSE 136* 119*  BUN 36* 27*  CREATININE 0.56* 0.52*  CALCIUM 8.5* 8.7*   GFR: Estimated Creatinine Clearance: 111.2 mL/min (A) (by C-G formula based on SCr of 0.52 mg/dL (L)). Liver Function Tests: Recent Labs  Lab 11/23/23 0316  AST 98*  ALT 46*  ALKPHOS 790*  BILITOT 0.7  PROT 6.4*  ALBUMIN 2.1*   No results for input(s): "LIPASE", "AMYLASE" in the last 168 hours. No results for input(s): "AMMONIA" in the last 168 hours. Coagulation Profile: No results for input(s): "INR", "PROTIME" in the last 168 hours. Cardiac Enzymes: No results for input(s): "CKTOTAL", "CKMB", "CKMBINDEX", "TROPONINI" in the last 168 hours. BNP (last 3 results) No results for input(s): "PROBNP" in the last 8760 hours. HbA1C: No results for input(s): "HGBA1C" in the last  72 hours. CBG: Recent Labs  Lab 11/25/23 1626  GLUCAP 117*   Lipid Profile: No results for input(s): "CHOL", "HDL", "LDLCALC", "TRIG", "CHOLHDL", "LDLDIRECT" in the last 72 hours. Thyroid Function Tests: No results for input(s): "TSH", "T4TOTAL", "FREET4", "T3FREE", "THYROIDAB" in the last 72 hours. Anemia Panel: No results for input(s): "VITAMINB12", "FOLATE", "FERRITIN", "TIBC", "IRON", "RETICCTPCT" in the last 72 hours. Sepsis Labs: Recent Labs  Lab 11/25/23 0457  PROCALCITON 0.41    No results found for this or any previous visit (from the past 240 hours).       Radiology Studies: No results found.      Scheduled Meds:  carvedilol  25 mg Oral BID WC   Chlorhexidine Gluconate Cloth  6 each Topical Daily   collagenase   Topical Daily   [START ON 11/27/2023] dexamethasone  2 mg Oral Daily   enoxaparin (LOVENOX) injection  40 mg Subcutaneous Q24H   lactulose  20 g Oral BID   morphine  115 mg Oral Q8H   OLANZapine  5 mg Oral QHS   pantoprazole  40 mg Oral Daily   potassium chloride  20 mEq Oral Daily   Ensure  Max Protein  11 oz Oral BID   senna  1 tablet Oral BID   sodium chloride flush  10-40 mL Intracatheter Q12H   Continuous Infusions:   LOS: 20 days    Time spent: 48 minutes spent on 11/26/2023 caring for this patient face-to-face including chart review, ordering labs/tests, documenting, discussion with nursing staff, consultants, updating family and interview/physical exam    Alvira Philips [place], DO [facility] Available via Epic secure chat 7am-7pm After these hours, please refer to coverage provider listed on [email or web address] 11/26/2023, 11:46 AM

## 2023-11-26 NOTE — Plan of Care (Signed)

## 2023-11-27 DIAGNOSIS — G893 Neoplasm related pain (acute) (chronic): Secondary | ICD-10-CM | POA: Diagnosis not present

## 2023-11-27 MED ORDER — HYDROMORPHONE HCL 1 MG/ML IJ SOLN: 0.5000 mg | INTRAMUSCULAR | Status: DC | PRN

## 2023-11-27 MED ORDER — LIDOCAINE 4 % EX CREA
TOPICAL_CREAM | Freq: Every day | CUTANEOUS | Status: DC | PRN
  Filled 2023-11-27: qty 5

## 2023-11-27 MED ORDER — OXYCODONE HCL 5 MG PO TABS
10.0000 mg | ORAL_TABLET | ORAL | Status: DC | PRN
  Administered 2023-11-27 – 2023-12-02 (×7): 10 mg via ORAL
  Filled 2023-11-27 (×7): qty 2

## 2023-11-27 NOTE — Progress Notes (Signed)
 PROGRESS NOTE    Real Cona  ZOX:[id number] DOB: [birth year] DOA: 11/06/2023 PCP: Frederic Jericho, [name]    Brief Narrative:   Joseph Haney is a 48 y.o. male with past medical history significant for metastatic colon cancer to liver, lung, bone, progressive debility/weakness and bedbound since December 2024 presented to [facility] ED on 11/06/2023 for intractable pain of malignancy.  Palliative care, oncology were consulted.  Patient was requiring high dose opiates and subsequently placed on Dilaudid PCA with MS Contin.  Given his progressive disease process, both palliative care and oncology recommended transitioning to hospice.  Patient and family declined and would like to continue to pursue all aggressive measures.  Dilaudid PCA was titrated off on 11/24/2023.  Pain adequately controlled and currently awaiting SNF bed offers.  Assessment & Plan:   Cancer associated pain Patient presenting to ED with intractable pain of malignancy.  Required high doses of opiates and subsequently placed on Dilaudid PCA.  Palliative care was consulted and followed during the hospital course.  PCA was titrated off on 11/24/2023. -- MS Contin 115 mg p.o. every 8 hours -- Oxycodone 5-10 mg p.o. every 4 hours as needed moderate pain -- Dilaudid 0.5 mg IV every 3 hours as needed breakthrough pain if not relieved with oral oxycodone -- Dexamethasone 2 mg PO daily x 3 days -- Lactulose 20 g p.o. 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 Mosetta Putt outpatient.  Patient with repeat imaging studies/PET scans showing progression of disease process.  Seen by Dr. Parke Poisson while inpatient and given his very poor performance status, mobility limitation he is not a candidate for further chemotherapy.  He has completed a short course of palliative radiation during this hospitalization.  Recommends transitioning to hospice  but family and patient declined and would like to continue to pursue all aggressive measures despite oncology making it very clear that he is not a candidate for further chemotherapy at this point. --Outpatient follow-up with medical oncology  Essential hypertension -- Carvedilol 25 mg p.o. twice daily  GERD -- Protonix 40 mg p.o. daily  Mood disorder -- Olanzapine 5 mg p.o. nightly   DVT prophylaxis: enoxaparin (LOVENOX) injection 40 mg Start: 11/07/23 1000    Code Status: Limited: Do not attempt resuscitation (DNR) -DNR-LIMITED -Do Not Intubate/DNI  Family Communication: No family present bedside this morning  Disposition Plan:  Level of care: Med-Surg Status is: Inpatient Remains inpatient appropriate because: Pending SNF placement, medically stable for discharge once bed available    Consultants:  Medical oncology, Dr. Mosetta Putt Palliative care  Procedures:  None  Antimicrobials:  None   Subjective: Patient seen today bedside, lying in bed.  Multiple family members present.  Patient with no complaints this morning.  Awaiting SNF placement.   Remains on oral pain medications, tolerating with good control.  Patient denies headache, no chest pain, no shortness of breath, no abdominal pain, no fever/chills/night sweats, no nausea/vomit/diarrhea.  No acute events overnight per nursing staff.  Medically stable for discharge to SNF once bed available  Objective: Vitals:   11/26/23 0936 11/26/23 1652 11/26/23 2022 11/27/23 0425  BP: 112/67 111/68 114/69 107/60  Pulse: 83  81 96  Resp:   18 17  Temp: 97.8 F (36.6 C)  98.3 F (36.8 C) (!) 97.5 F (36.4 C)  TempSrc: Oral     SpO2: 100%  100% 100%  Weight:  Height:        Intake/Output Summary (Last 24 hours) at 11/27/2023 1134 Last data filed at 11/27/2023 0600 Gross per 24 hour  Intake 360 ml  Output 1000 ml  Net -640 ml   Filed Weights   11/06/23 1450 11/18/23 0500  Weight: 56.2 kg 87.2 kg     Examination:  Physical Exam: GEN: NAD, alert, chronically ill appearance, appears older than stated age HEENT: NCAT, PERRL, EOMI, sclera clear, MMM PULM: CTAB w/o wheezes/crackles, normal respiratory effort, on room air with SpO2 100% at rest.   CV: RRR w/o M/G/R GI: abd soft, NTND, NABS, no R/G/M MSK: no peripheral edema, moves all extremities independently NEURO: No focal neurological deficit PSYCH: normal mood/affect Integumentary: No concerning rashes/lesions/wounds noted on exposed skin surfaces    Data Reviewed: I have personally reviewed following labs and imaging studies  CBC: Recent Labs  Lab 11/23/23 0316 11/25/23 0457  WBC 18.8* 13.4*  NEUTROABS  --  9.1*  HGB 8.4* 8.0*  HCT 28.1* 26.2*  MCV 89.5 89.1  PLT 152 115*   Basic Metabolic Panel: Recent Labs  Lab 11/23/23 0316 11/25/23 0457  NA 135 136  K 3.8 4.3  CL 101 100  CO2 22 28  GLUCOSE 136* 119*  BUN 36* 27*  CREATININE 0.56* 0.52*  CALCIUM 8.5* 8.7*   GFR: Estimated Creatinine Clearance: 111.2 mL/min (A) (by C-G formula based on SCr of 0.52 mg/dL (L)). Liver Function Tests: Recent Labs  Lab 11/23/23 0316  AST 98*  ALT 46*  ALKPHOS 790*  BILITOT 0.7  PROT 6.4*  ALBUMIN 2.1*   No results for input(s): "LIPASE", "AMYLASE" in the last 168 hours. No results for input(s): "AMMONIA" in the last 168 hours. Coagulation Profile: No results for input(s): "INR", "PROTIME" in the last 168 hours. Cardiac Enzymes: No results for input(s): "CKTOTAL", "CKMB", "CKMBINDEX", "TROPONINI" in the last 168 hours. BNP (last 3 results) No results for input(s): "PROBNP" in the last 8760 hours. HbA1C: No results for input(s): "HGBA1C" in the last 72 hours. CBG: Recent Labs  Lab 11/25/23 1626  GLUCAP 117*   Lipid Profile: No results for input(s): "CHOL", "HDL", "LDLCALC", "TRIG", "CHOLHDL", "LDLDIRECT" in the last 72 hours. Thyroid Function Tests: No results for input(s): "TSH", "T4TOTAL", "FREET4",  "T3FREE", "THYROIDAB" in the last 72 hours. Anemia Panel: No results for input(s): "VITAMINB12", "FOLATE", "FERRITIN", "TIBC", "IRON", "RETICCTPCT" in the last 72 hours. Sepsis Labs: Recent Labs  Lab 11/25/23 0457  PROCALCITON 0.41    No results found for this or any previous visit (from the past 240 hours).       Radiology Studies: No results found.      Scheduled Meds:  carvedilol  25 mg Oral BID WC   Chlorhexidine Gluconate Cloth  6 each Topical Daily   collagenase   Topical Daily   dexamethasone  2 mg Oral Daily   enoxaparin (LOVENOX) injection  40 mg Subcutaneous Q24H   lactulose  20 g Oral BID   morphine  115 mg Oral Q8H   OLANZapine  5 mg Oral QHS   pantoprazole  40 mg Oral Daily   potassium chloride  20 mEq Oral Daily   Ensure Max Protein  11 oz Oral BID   senna  1 tablet Oral BID   sodium chloride flush  10-40 mL Intracatheter Q12H   Continuous Infusions:   LOS: 21 days    Time spent: 48 minutes spent on 11/27/2023 caring for this patient face-to-face including chart review, ordering  labs/tests, documenting, discussion with nursing staff, consultants, updating family and interview/physical exam    Alvira Philips [place], DO [facility] Available via Epic secure chat 7am-7pm After these hours, please refer to coverage provider listed on [email or web address] 11/27/2023, 11:34 AM

## 2023-11-27 NOTE — Plan of Care (Signed)

## 2023-11-27 NOTE — Progress Notes (Signed)
 Daily Progress Note   Patient Name: Joseph Haney       Date: 11/27/2023 DOB: August 23, 1976  Age: 48 y.o. MRN#: [id number] Attending Physician: [place], Eric J, DO Primary Care Physician: Frederic Jericho, [name] Admit Date: 11/06/2023 Length of Stay: 21 days  Reason for Consultation/Follow-up: Establishing goals of care, Non pain symptom management, and Pain control  Subjective:   CC: Patient seen laying in bed today.  Palliative medicine team following up regarding symptom management and complex medical decision making.  Subjective:  Reviewed EMR prior to presenting to bedside.  At time of EMR review in past 24 hours patient has received as needed oxycodone 10 mg x 3 doses and as needed IV Dilaudid 0.5 mg x 2 doses.  Presented to bedside to see patient.  Patient's brother and son at bedside.  Patient does feel that the 10 mg of oxycodone assist with his pain management.  Discussed we will change dosing to just be 10 mg since 5 mg was not helping patient's pain enough.  Encouraged activity as recommended by physical therapy.  All questions answered at that time.  Noted palliative medicine team would continue following with patient's medical journey.  Discussed care with IDT during the day including hospitalist, RN, and TOC.   Objective:   Vital Signs:  BP 107/60   Pulse 96   Temp (!) 97.5 F (36.4 C)   Resp 17   Ht 5\' 4"  (1.626 m)   Wt 87.2 kg   SpO2 100%   BMI 33.01 kg/m   Physical Exam: General: NAD, alert, cachectic, chronically ill-appearing, frail Cardiovascular: RRR Respiratory: no increased work of breathing noted, not in respiratory distress Neuro: A&Ox4, following commands easily Psych: appropriately answers all questions  Imaging:  I personally reviewed recent imaging.   Assessment & Plan:   Assessment: Patient is a 48 year old male with a past medical history of metastatic colon cancer with metastatic disease to lungs, bone, and liver who was admitted on  11/06/2023 for uncontrolled cancer associated pain.  Recent imaging has shown progression of the disease and patient's bone.  Oncology consulted for recommendations.  Palliative medicine team consulted to assist with symptom management.  Recommendations/Plan: # Complex medical decision making/goals of care:  - Discussed care with patient with family present at bedside as detailed above in HPI.  Multiple providers have had multiple conversations about patient's medical status, prognosis, not being a candidate for further cancer directed therapy based on his poor functional and nutritional status, and discussed possible support moving forward through home hospice.  Patient and family noting continued wish for aggressive interventions such as PT and hopes to "get better".  Again spent time counseling patient on need to participate with therapy if his ultimate goal is to "get stronger".  Palliative medicine team continuing to follow along to engage in conversations as able.  -  Code Status: Limited: Do not attempt resuscitation (DNR) -DNR-LIMITED -Do Not Intubate/DNI   # Symptom management: Patient is receiving these palliative interventions for symptom management with an intent to improve quality of life.   -Pain, acute on chronic in setting of metastatic colon cancer with mets to bone, liver, and lung   -Continue MS Contin to 115 mg every 8 hours.     -Change oxycodone to 10 mg every 4 hours as needed   -Change IV Dilaudid 0.5 mg every 4 hours as needed breakthrough after oral oxycodone   -Continue dexamethasone 2 mg daily for 3 doses total to complete taper   -  Already completed Celebrex 200 mg twice daily for total of 10 doses.   Patient receiving Protonix 40 mg daily.   -Consider use of bisphosphonate    -Completed radiation   -Mood/appetite   -Personally reviewed EKG which noted QTc on 11/09/2023 to be 432   -Continue olanzapine to 5 mg nightly   -Constipation   -Continue senna 1 tab twice  daily  # Psychosocial Support:  -Brother, mother, son  -Chaplain following for support  # Discharge Planning: TBD.  TOC assisting with coordination of care  Discussed with: Patient, patient's family at bedside, hospitalist, RN, [facility]  Thank you for allowing the palliative care team to participate in the care Joseph Haney.  Alvester Morin, DO Palliative Care Provider PMT # [phone]  If patient remains symptomatic despite maximum doses, please call PMT at [phone] between 0700 and 1900. Outside of these hours, please call attending, as PMT does not have night coverage.

## 2023-11-28 DIAGNOSIS — G893 Neoplasm related pain (acute) (chronic): Secondary | ICD-10-CM | POA: Diagnosis not present

## 2023-11-28 MED ORDER — GUAIFENESIN-DM 100-10 MG/5ML PO SYRP
5.0000 mL | ORAL_SOLUTION | ORAL | Status: DC | PRN
  Administered 2023-11-28 – 2023-11-29 (×3): 5 mL via ORAL
  Filled 2023-11-28 (×3): qty 5

## 2023-11-28 NOTE — Plan of Care (Signed)

## 2023-11-28 NOTE — Progress Notes (Signed)
 PROGRESS NOTE    [name]  ZOX:[id number] DOB: [birth year] DOA: 11/06/2023 PCP: Frederic Jericho, [name]    Brief Narrative:   Joseph Haney is a 48 y.o. male with past medical history significant for metastatic colon cancer to liver, lung, bone, progressive debility/weakness and bedbound since December 2024 presented to [facility] ED on 11/06/2023 for intractable pain of malignancy.  Palliative care, oncology were consulted.  Patient was requiring high dose opiates and subsequently placed on Dilaudid PCA with MS Contin.  Given his progressive disease process, both palliative care and oncology recommended transitioning to hospice.  Patient and family declined and would like to continue to pursue all aggressive measures.  Dilaudid PCA was titrated off on 11/24/2023.  Pain adequately controlled and currently awaiting SNF bed offers.  Assessment & Plan:   Cancer associated pain Patient presenting to ED with intractable pain of malignancy.  Required high doses of opiates and subsequently placed on Dilaudid PCA.  Palliative care was consulted and followed during the hospital course.  PCA was titrated off on 11/24/2023. -- MS Contin 115 mg p.o. every 8 hours -- Oxycodone 5-10 mg p.o. every 4 hours as needed moderate pain -- Dilaudid 0.5 mg IV every 3 hours as needed breakthrough pain if not relieved with oral oxycodone -- Dexamethasone 2 mg PO daily x 3 days -- Lactulose 20 g p.o. 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 Mosetta Putt outpatient.  Patient with repeat imaging studies/PET scans showing progression of disease process.  Seen by Dr. Parke Poisson while inpatient and given his very poor performance status, mobility limitation he is not a candidate for further chemotherapy.  He has completed a short course of palliative radiation during this hospitalization.  Recommends transitioning to hospice  but family and patient declined and would like to continue to pursue all aggressive measures despite oncology making it very clear that he is not a candidate for further chemotherapy at this point. --Outpatient follow-up with medical oncology  Essential hypertension -- Carvedilol 25 mg p.o. twice daily  GERD -- Protonix 40 mg p.o. daily  Mood disorder -- Olanzapine 5 mg p.o. nightly  Debility/deconditioning/gait disturbance: Has been seen by PT and OT with recommendation of SNF placement.  Unfortunately per [facility], extensive SNF search has resulted in no bed offers and will likely need to return home with home health, will also be difficult for TOC given his insurance carrier.   DVT prophylaxis: enoxaparin (LOVENOX) injection 40 mg Start: 11/07/23 1000    Code Status: Limited: Do not attempt resuscitation (DNR) -DNR-LIMITED -Do Not Intubate/DNI  Family Communication: No family present bedside this morning  Disposition Plan:  Level of care: Med-Surg Status is: Inpatient Remains inpatient appropriate because: Pending SNF placement, no bed offers so far    Consultants:  Medical oncology, Dr. Mosetta Putt Palliative care  Procedures:  None  Antimicrobials:  None   Subjective: Patient seen today bedside, lying in bed.  No family present.  Discussed with patient that he has received no bed offers despite "extensive search" per TOC.  Discussed with patient that he will likely need to return home with home health services.  Patient upset that he cannot return home and that he will not discharge.  Discussed with him that he cannot remain in the hospital for eternity and unfortunately no other options at this point.  Pain continues to be controlled with only minimal use of  oral as needed pain medication.  Discussed with palliative care, Dr. Patterson Hammersmith this am.  Overall very poor prognosis given his metastatic cancer with significant progression in which she is not a candidate for any further chemotherapy  per medical oncology, Dr. Mosetta Putt. Patient denies headache, no chest pain, no shortness of breath, no abdominal pain, no fever/chills/night sweats, no nausea/vomit/diarrhea.  No acute events overnight per nursing staff.   Objective: Vitals:   11/27/23 1218 11/27/23 2001 11/28/23 0522 11/28/23 1206  BP: 110/70 95/62 112/67 (!) 107/59  Pulse: 98 93 90 (!) 107  Resp:  18 18 18   Temp: 98.2 F (36.8 C) 98.1 F (36.7 C) 98 F (36.7 C) 98.5 F (36.9 C)  TempSrc: Oral Oral Oral Oral  SpO2: 100%  97% 96%  Weight:      Height:        Intake/Output Summary (Last 24 hours) at 11/28/2023 1214 Last data filed at 11/28/2023 0543 Gross per 24 hour  Intake 240 ml  Output 900 ml  Net -660 ml   Filed Weights   11/06/23 1450 11/18/23 0500  Weight: 56.2 kg 87.2 kg    Examination:  Physical Exam: GEN: NAD, alert, chronically ill appearance, appears older than stated age HEENT: NCAT, PERRL, EOMI, sclera clear, MMM PULM: CTAB w/o wheezes/crackles, normal respiratory effort, on room air with SpO2 100% at rest.   CV: RRR w/o M/G/R GI: abd soft, NTND, NABS, no R/G/M MSK: no peripheral edema, moves all extremities independently NEURO: No focal neurological deficit PSYCH: normal mood/affect Integumentary: No concerning rashes/lesions/wounds noted on exposed skin surfaces    Data Reviewed: I have personally reviewed following labs and imaging studies  CBC: Recent Labs  Lab 11/23/23 0316 11/25/23 0457  WBC 18.8* 13.4*  NEUTROABS  --  9.1*  HGB 8.4* 8.0*  HCT 28.1* 26.2*  MCV 89.5 89.1  PLT 152 115*   Basic Metabolic Panel: Recent Labs  Lab 11/23/23 0316 11/25/23 0457  NA 135 136  K 3.8 4.3  CL 101 100  CO2 22 28  GLUCOSE 136* 119*  BUN 36* 27*  CREATININE 0.56* 0.52*  CALCIUM 8.5* 8.7*   GFR: Estimated Creatinine Clearance: 111.2 mL/min (A) (by C-G formula based on SCr of 0.52 mg/dL (L)). Liver Function Tests: Recent Labs  Lab 11/23/23 0316  AST 98*  ALT 46*  ALKPHOS  790*  BILITOT 0.7  PROT 6.4*  ALBUMIN 2.1*   No results for input(s): "LIPASE", "AMYLASE" in the last 168 hours. No results for input(s): "AMMONIA" in the last 168 hours. Coagulation Profile: No results for input(s): "INR", "PROTIME" in the last 168 hours. Cardiac Enzymes: No results for input(s): "CKTOTAL", "CKMB", "CKMBINDEX", "TROPONINI" in the last 168 hours. BNP (last 3 results) No results for input(s): "PROBNP" in the last 8760 hours. HbA1C: No results for input(s): "HGBA1C" in the last 72 hours. CBG: Recent Labs  Lab 11/25/23 1626  GLUCAP 117*   Lipid Profile: No results for input(s): "CHOL", "HDL", "LDLCALC", "TRIG", "CHOLHDL", "LDLDIRECT" in the last 72 hours. Thyroid Function Tests: No results for input(s): "TSH", "T4TOTAL", "FREET4", "T3FREE", "THYROIDAB" in the last 72 hours. Anemia Panel: No results for input(s): "VITAMINB12", "FOLATE", "FERRITIN", "TIBC", "IRON", "RETICCTPCT" in the last 72 hours. Sepsis Labs: Recent Labs  Lab 11/25/23 0457  PROCALCITON 0.41    No results found for this or any previous visit (from the past 240 hours).       Radiology Studies: No results found.      Scheduled Meds:  carvedilol  25 mg Oral BID WC   Chlorhexidine Gluconate Cloth  6 each Topical Daily   collagenase   Topical Daily   dexamethasone  2 mg Oral Daily   enoxaparin (LOVENOX) injection  40 mg Subcutaneous Q24H   lactulose  20 g Oral BID   morphine  115 mg Oral Q8H   OLANZapine  5 mg Oral QHS   pantoprazole  40 mg Oral Daily   potassium chloride  20 mEq Oral Daily   Ensure Max Protein  11 oz Oral BID   senna  1 tablet Oral BID   sodium chloride flush  10-40 mL Intracatheter Q12H   Continuous Infusions:   LOS: 22 days    Time spent: 48 minutes spent on 11/28/2023 caring for this patient face-to-face including chart review, ordering labs/tests, documenting, discussion with nursing staff, consultants, updating family and interview/physical  exam    Alvira Philips [place], DO [facility] Available via Epic secure chat 7am-7pm After these hours, please refer to coverage provider listed on [email or web address] 11/28/2023, 12:14 PM

## 2023-11-28 NOTE — Progress Notes (Signed)
  Daily Progress Note   Patient Name: Joseph Haney       Date: 11/28/2023 DOB: 03-13-1976  Age: 48 y.o. MRN#: [id number] Attending Physician: [place], Eric J, DO Primary Care Physician: Frederic Jericho, [name] Admit Date: 11/06/2023 Length of Stay: 22 days  Discussed care with primary hospitalist today.  At time of EMR review in past 24 hours patient has only required oxycodone 10 mg as needed x 2 doses.  Patient continues to receive MS Contin 115 mg every 8 hours during the day for long-acting pain management.  As per TOC, patient has been denied from rehab.  Patient would have to return home with home health support.  Could refer for home palliative care for follow-up as well as patient has refused hospice support.  Please reach out if acute PMT needs arise.  Alvester Morin, DO Palliative Care Provider PMT # [phone]  No Charge Note

## 2023-11-29 DIAGNOSIS — G893 Neoplasm related pain (acute) (chronic): Secondary | ICD-10-CM | POA: Diagnosis not present

## 2023-11-29 NOTE — Plan of Care (Signed)

## 2023-11-29 NOTE — Progress Notes (Signed)
 PROGRESS NOTE    Joseph Haney  DDU:[id number] DOB: 1976-05-24 DOA: 11/06/2023 PCP: Frederic Jericho, [name]    Brief Narrative:   Joseph Haney is a 48 y.o. male with past medical history significant for metastatic colon cancer to liver, lung, bone, progressive debility/weakness and bedbound since December 2024 presented to [facility] ED on 11/06/2023 for intractable pain of malignancy.  Palliative care, oncology were consulted.  Patient was requiring high dose opiates and subsequently placed on Dilaudid PCA with MS Contin.  Given his progressive disease process, both palliative care and oncology recommended transitioning to hospice.  Patient and family declined and would like to continue to pursue all aggressive measures.  Dilaudid PCA was titrated off on 11/24/2023.  Pain adequately controlled and currently awaiting SNF bed offers.  Assessment & Plan:   Cancer associated pain Patient presenting to ED with intractable pain of malignancy.  Required high doses of opiates and subsequently placed on Dilaudid PCA.  Palliative care was consulted and followed during the hospital course.  PCA was titrated off on 11/24/2023, and completed steroid taper with dexamethasone. -- MS Contin 115 mg p.o. every 8 hours -- Oxycodone 10 mg p.o. every 4 hours as needed moderate pain -- Dilaudid 0.5 mg IV every 3 hours as needed breakthrough pain if not relieved with oral oxycodone -- Lactulose 20 g p.o. 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 Mosetta Putt outpatient.  Patient with repeat imaging studies/PET scans showing progression of disease process.  Seen by Dr. Parke Poisson while inpatient and given his very poor performance status, mobility limitation he is not a candidate for further chemotherapy.  He has completed a short course of palliative radiation during this hospitalization.  Recommends transitioning to  hospice but family and patient declined and would like to continue to pursue all aggressive measures despite oncology making it very clear that he is not a candidate for further chemotherapy at this point. --Outpatient follow-up with medical oncology  Essential hypertension Will discontinue carvedilol due to borderline hypotension. -- Monitor BP closely  GERD -- Protonix 40 mg p.o. daily  Mood disorder -- Olanzapine 5 mg p.o. nightly  Debility/deconditioning/gait disturbance: Has been seen by PT and OT with recommendation of SNF placement.  Unfortunately per [facility], extensive SNF search has resulted in no bed offers and will likely need to return home with home health, will also be difficult for TOC given his insurance carrier.   DVT prophylaxis: enoxaparin (LOVENOX) injection 40 mg Start: 11/07/23 1000    Code Status: Limited: Do not attempt resuscitation (DNR) -DNR-LIMITED -Do Not Intubate/DNI  Family Communication: No family present bedside this morning  Disposition Plan:  Level of care: Med-Surg Status is: Inpatient Remains inpatient appropriate because: Pending SNF placement, no bed offers so far    Consultants:  Medical oncology, Dr. Mosetta Putt Palliative care  Procedures:  None  Antimicrobials:  None   Subjective: Patient seen today bedside, lying in bed.  Mother present at bedside.  Patient and mother reports that they have reached out to 2-3 additional SNF's for evaluation.  Discussed once again with both parties that he may need to return home if there are no bed offers.  Discussed will follow-up with TOC tomorrow.  Continues not to require any IV pain medication, currently controlled with oral MS Contin and Oxy IR as needed.  Overall very poor prognosis given his metastatic cancer with significant progression in  which she is not a candidate for any further chemotherapy per medical oncology, Dr. Mosetta Putt. Patient denies headache, no chest pain, no shortness of breath, no  abdominal pain, no fever/chills/night sweats, no nausea/vomit/diarrhea.  No acute events overnight per nursing staff.   Objective: Vitals:   11/28/23 1658 11/28/23 1942 11/28/23 1959 11/29/23 0541  BP: 102/65 (!) 88/53 (!) 97/59 94/60  Pulse: 92 97 93 85  Resp:  15  17  Temp:  98.5 F (36.9 C)  98.2 F (36.8 C)  TempSrc:    Oral  SpO2:  98%  100%  Weight:      Height:        Intake/Output Summary (Last 24 hours) at 11/29/2023 1152 Last data filed at 11/29/2023 0500 Gross per 24 hour  Intake --  Output 1075 ml  Net -1075 ml   Filed Weights   11/06/23 1450 11/18/23 0500  Weight: 56.2 kg 87.2 kg    Examination:  Physical Exam: GEN: NAD, alert, chronically ill appearance, appears older than stated age HEENT: NCAT, PERRL, EOMI, sclera clear, MMM PULM: CTAB w/o wheezes/crackles, normal respiratory effort, on room air with SpO2 100% at rest.   CV: RRR w/o M/G/R GI: abd soft, NTND, NABS, no R/G/M MSK: no peripheral edema, moves all extremities independently NEURO: No focal neurological deficit PSYCH: normal mood/affect Integumentary: No concerning rashes/lesions/wounds noted on exposed skin surfaces    Data Reviewed: I have personally reviewed following labs and imaging studies  CBC: Recent Labs  Lab 11/23/23 0316 11/25/23 0457  WBC 18.8* 13.4*  NEUTROABS  --  9.1*  HGB 8.4* 8.0*  HCT 28.1* 26.2*  MCV 89.5 89.1  PLT 152 115*   Basic Metabolic Panel: Recent Labs  Lab 11/23/23 0316 11/25/23 0457  NA 135 136  K 3.8 4.3  CL 101 100  CO2 22 28  GLUCOSE 136* 119*  BUN 36* 27*  CREATININE 0.56* 0.52*  CALCIUM 8.5* 8.7*   GFR: Estimated Creatinine Clearance: 111.2 mL/min (A) (by C-G formula based on SCr of 0.52 mg/dL (L)). Liver Function Tests: Recent Labs  Lab 11/23/23 0316  AST 98*  ALT 46*  ALKPHOS 790*  BILITOT 0.7  PROT 6.4*  ALBUMIN 2.1*   No results for input(s): "LIPASE", "AMYLASE" in the last 168 hours. No results for input(s): "AMMONIA"  in the last 168 hours. Coagulation Profile: No results for input(s): "INR", "PROTIME" in the last 168 hours. Cardiac Enzymes: No results for input(s): "CKTOTAL", "CKMB", "CKMBINDEX", "TROPONINI" in the last 168 hours. BNP (last 3 results) No results for input(s): "PROBNP" in the last 8760 hours. HbA1C: No results for input(s): "HGBA1C" in the last 72 hours. CBG: Recent Labs  Lab 11/25/23 1626  GLUCAP 117*   Lipid Profile: No results for input(s): "CHOL", "HDL", "LDLCALC", "TRIG", "CHOLHDL", "LDLDIRECT" in the last 72 hours. Thyroid Function Tests: No results for input(s): "TSH", "T4TOTAL", "FREET4", "T3FREE", "THYROIDAB" in the last 72 hours. Anemia Panel: No results for input(s): "VITAMINB12", "FOLATE", "FERRITIN", "TIBC", "IRON", "RETICCTPCT" in the last 72 hours. Sepsis Labs: Recent Labs  Lab 11/25/23 0457  PROCALCITON 0.41    No results found for this or any previous visit (from the past 240 hours).       Radiology Studies: No results found.      Scheduled Meds:  carvedilol  25 mg Oral BID WC   Chlorhexidine Gluconate Cloth  6 each Topical Daily   enoxaparin (LOVENOX) injection  40 mg Subcutaneous Q24H   lactulose  20 g Oral  BID   morphine  115 mg Oral Q8H   OLANZapine  5 mg Oral QHS   pantoprazole  40 mg Oral Daily   potassium chloride  20 mEq Oral Daily   Ensure Max Protein  11 oz Oral BID   senna  1 tablet Oral BID   sodium chloride flush  10-40 mL Intracatheter Q12H   Continuous Infusions:   LOS: 23 days    Time spent: 48 minutes spent on 11/29/2023 caring for this patient face-to-face including chart review, ordering labs/tests, documenting, discussion with nursing staff, consultants, updating family and interview/physical exam    Alvira Philips [place], DO [facility] Available via Epic secure chat 7am-7pm After these hours, please refer to coverage provider listed on [email or web address] 11/29/2023, 11:52 AM

## 2023-11-29 NOTE — Progress Notes (Signed)
 Pt requested to eat at this time before he gets his 2 hour turn. Sister at bedside

## 2023-11-29 NOTE — Progress Notes (Signed)
 Sacral dressing change completed.  Wound has strong foul smell, and is leaking tan fluid.  The wound appears to be expanding.  Pt denies pain.  Pt has been turned and will continue to be turned every 2 hours with pillows under all bony prominences.  Pt reports being comfortable at this time

## 2023-11-30 DIAGNOSIS — G893 Neoplasm related pain (acute) (chronic): Secondary | ICD-10-CM | POA: Diagnosis not present

## 2023-11-30 LAB — CBC
HCT: 23.5 % — ABNORMAL LOW (ref 39.0–52.0)
Hemoglobin: 7.1 g/dL — ABNORMAL LOW (ref 13.0–17.0)
MCH: 27.1 pg (ref 26.0–34.0)
MCHC: 30.2 g/dL (ref 30.0–36.0)
MCV: 89.7 fL (ref 80.0–100.0)
Platelets: 93 10*3/uL — ABNORMAL LOW (ref 150–400)
RBC: 2.62 MIL/uL — ABNORMAL LOW (ref 4.22–5.81)
RDW: 19.4 % — ABNORMAL HIGH (ref 11.5–15.5)
WBC: 7.8 10*3/uL (ref 4.0–10.5)
nRBC: 6.6 % — ABNORMAL HIGH (ref 0.0–0.2)

## 2023-11-30 LAB — HEMOGLOBIN AND HEMATOCRIT, BLOOD
HCT: 28.2 % — ABNORMAL LOW (ref 39.0–52.0)
Hemoglobin: 8.7 g/dL — ABNORMAL LOW (ref 13.0–17.0)

## 2023-11-30 LAB — PREPARE RBC (CROSSMATCH)

## 2023-11-30 MED ORDER — SODIUM CHLORIDE 0.9% IV SOLUTION: Freq: Once | INTRAVENOUS | Status: AC

## 2023-11-30 NOTE — TOC Progression Note (Addendum)
 Transition of Care [facility]) - Progression Note    Patient Details  Name: Joseph Haney MRN: [id number] Date of Birth: 1976-02-23  [facility]) CM/SW Contact  Jessie Foot, RN Phone Number: 11/30/2023, 11:02 AM  Clinical Narrative:    Patients sister requested to see TOC; spoke w/ pt, sister Glee Arvin, and son in room; they were notified no bed offers from facilities; she then requested [facility] for 24/7 care b/c their mother cannot care for him; explained HH services do not 24/7 care; discussed private duty that would be out of pocket; pt/sister declined resources;  also explained family can be educated on how to assist pt w/ ADLs; she then asked about obtaining hospital bed and DME; discussed hospice services as they provide equipment; pt and sister agree to discussing home hospice; they selected [facility]; [facility], [facility] Liaison notified via secure chat; additionally pt's sister requested resources for applying for Medicare on his behalf; resource for social services placed in d/c instructions; she was also given copy of resource, and will contact agency for appt; TOC is following  3:30 PM:  Patients mother requested to speak with NCM. Spoke with mother, sister and brother all in the room at bedside. The brother asked if NCM has ordered all equipment his brother will need at home. I informed the brother that [facility] would be responsible for ordering neccessary equipment. Wanted to know if or when [facility] would be by to talk with family. I informed them that I was not sure.     Barriers to Discharge: Continued Medical Work up  Expected Discharge Plan and Services                                               Social Determinants of Health (SDOH) Interventions SDOH Screenings   Food Insecurity: No Food Insecurity (11/07/2023)  Housing: High Risk (11/07/2023)  Transportation Needs: Unmet Transportation Needs (11/07/2023)  Utilities: Not At Risk  (11/07/2023)  Social Connections: Unknown (11/07/2023)  Tobacco Use: Low Risk  (11/08/2023)    Readmission Risk Interventions    11/09/2023    9:44 AM 11/07/2023    2:14 PM  Readmission Risk Prevention Plan  Post Dischage Appt  Complete  Medication Screening  Complete  Transportation Screening Complete Complete  HRI or Home Care Consult Complete   Social Work Consult for Recovery Care Planning/Counseling Complete   Palliative Care Screening Not Applicable   Medication Review Oceanographer) Complete

## 2023-11-30 NOTE — Progress Notes (Signed)
 OT Cancellation Note  Patient Details Name: Joseph Haney MRN: [id number] DOB: 1976-02-10   Cancelled Treatment:    Reason Eval/Treat Not Completed: Patient not medically ready Patient currently receiving unit of PRBCs at this time. OT to continue to follow and check back on 4/1.  Rosalio Loud, MS Acute Rehabilitation Department Office# [phone]  11/30/2023, 3:19 PM

## 2023-11-30 NOTE — Progress Notes (Signed)
 Daily Progress Note   Patient Name: Joseph Haney       Date: 11/30/2023 DOB: 07-13-1976  Age: 48 y.o. MRN#: [id number] Attending Physician: Haney, Joseph J, DO Primary Care Physician: Joseph Jericho, [name] Admit Date: 11/06/2023 Length of Stay: 24 days  Reason for Consultation/Follow-up: Establishing goals of care, Non pain symptom management, and Pain control  Subjective:   CC: Patient seen laying comfortably in bed today.  Palliative medicine team following up regarding symptom management and complex medical decision making.  Subjective:  Reviewed EMR prior to presenting to bedside.  At time of EMR review in past 24 hours patient has required as needed oxycodone 10 mg x 1 dose.  Patient remains on MS Contin 115 mg every 8 hours during the day. Patient has not been accepted to a rehab facility.  Presented to bedside to see patient.  TOC present at bedside meeting with patient and his sister.  TOC discussing that patient has not been accepted to rehab.  Patient would be discharged home with home health.  At time of interaction joining, TOC about to discuss home palliative support versus home hospice support.  Again spent time discussing home palliative palliative versus hospice.  Patient and family have met with hospice provider earlier in hospitalization.  Described differences and similarities about care they would provide.  Patient inquiring what pathway should be followed, and again stated that multiple medical providers have recommended patient return home with hospice care.  Explained that patient cannot get home health while getting home hospice.  Noted that patient can continue on oral pain medications.  Patient has not needed any as needed IV medications.  Spent time answering questions as able and offering emotional support be active listening.  Sister asking questions regarding insurance coverage show asked TOC to come back to bedside after they had stepped out to further  discuss this with.  All questions answered at that time.  Informed by [facility] that patient and family have elected for patient to go home with hospice.  [facility] liaison consulted to assist with this.  Objective:   Vital Signs:  BP 108/64 (BP Location: Right Arm)   Pulse 87   Temp 98.1 F (36.7 C) (Oral)   Resp 18   Ht 5\' 4"  (1.626 m)   Wt 87.2 kg   SpO2 98%   BMI 33.01 kg/m   Physical Exam: General: NAD, alert, cachectic, chronically ill-appearing, frail Cardiovascular: RRR Respiratory: no increased work of breathing noted, not in respiratory distress Neuro: A&Ox4, following commands easily Psych: appropriately answers all questions  Imaging:  I personally reviewed recent imaging.   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  Recent imaging has shown progression of the disease and patient's bone.  Oncology consulted for recommendations.  Palliative medicine team consulted to assist with symptom management.  Recommendations/Plan: # Complex medical decision making/goals of care:  - Discussed care with patient with family present at bedside as detailed above in HPI.  Multiple providers have had multiple conversations about patient's medical status, prognosis, not being a candidate for further cancer directed therapy based on his poor functional and nutritional status, and discussed possible support moving forward through home hospice.  While patient has focused on desire to "get better", patient has been denied from rehab.  Patient and family now electing to  go home with hospice support as per TOC's discussion today.  Palliative medicine team will follow along peripherally.  Please reach out if acute needs arise.  -  Code Status: Limited: Do not attempt resuscitation (DNR) -DNR-LIMITED -Do Not  Intubate/DNI   # Symptom management: Patient is receiving these palliative interventions for symptom management with an intent to improve quality of life.   -Pain, acute on chronic in setting of metastatic colon cancer with mets to bone, liver, and lung   -Continue MS Contin to 115 mg every 8 hours.     -Continue oxycodone to 10 mg every 4 hours as needed   -Discontinue IV Dilaudid   -Completed dexamethasone taper   -Already completed Celebrex 200 mg twice daily for total of 10 doses.   Patient receiving Protonix 40 mg daily.    -Completed radiation   -Mood/appetite   -Personally reviewed EKG which noted QTc on 11/09/2023 to be 432   -Continue olanzapine to 5 mg nightly   -Constipation   -Continue senna 1 tab twice daily  # Psychosocial Support:  -Brother, mother, son  -Chaplain following for support  # Discharge Planning: TOC spoke with family and patient today and now proceeding on home with hospice support via [facility] after patient was not accepted to any rehab  Discussed with: Patient, patient's family at bedside, hospitalist, RN, [facility]  Thank you for allowing the palliative care team to participate in the care Joseph Haney.  Joseph Morin, DO Palliative Care Provider PMT # [phone]  If patient remains symptomatic despite maximum doses, please call PMT at [phone] between 0700 and 1900. Outside of these hours, please call attending, as PMT does not have night coverage.  Personally spent 35 minutes in patient care including extensive chart review (labs, imaging, progress/consult notes, vital signs), medically appropraite exam, discussed with treatment team, education to patient, family, and staff, documenting clinical information, medication review and management, coordination of care, and available advanced directive documents.

## 2023-11-30 NOTE — Plan of Care (Signed)

## 2023-11-30 NOTE — Progress Notes (Signed)
 PROGRESS NOTE    [name]  ZOX:[id number] DOB: 01-12-1976 DOA: 11/06/2023 PCP: Frederic Jericho, [name]    Brief Narrative:   Joseph Haney is a 48 y.o. male with past medical history significant for metastatic colon cancer to liver, lung, bone, progressive debility/weakness and bedbound since December 2024 presented to [facility] ED on 11/06/2023 for intractable pain of malignancy.  Palliative care, oncology were consulted.  Patient was requiring high dose opiates and subsequently placed on Dilaudid PCA with MS Contin.  Given his progressive disease process, both palliative care and oncology recommended transitioning to hospice.  Patient and family declined and would like to continue to pursue all aggressive measures.  Dilaudid PCA was titrated off on 11/24/2023.  Pain adequately controlled and currently awaiting SNF bed offers.  Assessment & Plan:   Cancer associated pain Patient presenting to ED with intractable pain of malignancy.  Required high doses of opiates and subsequently placed on Dilaudid PCA.  Palliative care was consulted and followed during the hospital course.  PCA was titrated off on 11/24/2023, and completed steroid taper with dexamethasone. -- MS Contin 115 mg p.o. every 8 hours -- Oxycodone 10 mg p.o. every 4 hours as needed moderate pain -- Lactulose 20 g p.o. 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 Mosetta Putt outpatient.  Patient with repeat imaging studies/PET scans showing progression of disease process.  Seen by Dr. Parke Poisson while inpatient and given his very poor performance status, mobility limitation he is not a candidate for further chemotherapy.  He has completed a short course of palliative radiation during this hospitalization.  Recommends transitioning to hospice but family and patient declined and would like to continue to pursue all aggressive measures  despite oncology making it very clear that he is not a candidate for further chemotherapy at this point. --Outpatient follow-up with medical oncology  Anemia of chronic medical disease Hemoglobin down to 7.1, no overt signs of bleeding; suspect from underlying colon malignancy.  Will transfuse 1 unit PRBC today.  Thrombocytopenia Platelet count 93, down from 115 previously.  Will discontinue Lovenox.  Essential hypertension Will discontinue carvedilol due to borderline hypotension. -- Monitor BP closely  GERD -- Protonix 40 mg p.o. daily  Mood disorder -- Olanzapine 5 mg p.o. nightly  Debility/deconditioning/gait disturbance: Has been seen by PT and OT with recommendation of SNF placement.  Unfortunately per [facility], extensive SNF search has resulted in no bed offers and will likely need to return home with home health, will also be difficult for TOC given his insurance carrier.   DVT prophylaxis:     Code Status: Limited: Do not attempt resuscitation (DNR) -DNR-LIMITED -Do Not Intubate/DNI  Family Communication: No family present bedside this morning  Disposition Plan:  Level of care: Med-Surg Status is: Inpatient Remains inpatient appropriate because: Anticipate discharge home tomorrow with home hospice once equipment arrives    Consultants:  Medical oncology, Dr. Mosetta Putt Palliative care  Procedures:  None  Antimicrobials:  None   Subjective: Patient seen today bedside, lying in bed.  Sister and brother present at bedside.  Discussed once again no SNF bed offers obtained and will likely need to go home.  Sister requesting hospital bed.  Discussed with palliative care and case management.  Family now agreeable to home hospice services given extensive needs.  TOC consulted hospice.    Overall very poor prognosis given his metastatic cancer with significant  progression in which she is not a candidate for any further chemotherapy per medical oncology, Dr. Mosetta Putt. Patient denies  headache, no chest pain, no shortness of breath, no abdominal pain, no fever/chills/night sweats, no nausea/vomiting/diarrhea.  No acute events overnight per nursing staff.   Objective: Vitals:   11/29/23 0541 11/29/23 1154 11/29/23 2042 11/30/23 0418  BP: 94/60 102/63 117/79 108/64  Pulse: 85 98 (!) 109 87  Resp: 17 16 20 18   Temp: 98.2 F (36.8 C) 98.5 F (36.9 C) 97.7 F (36.5 C) 98.1 F (36.7 C)  TempSrc: Oral Oral Oral Oral  SpO2: 100% 98% 99% 98%  Weight:      Height:        Intake/Output Summary (Last 24 hours) at 11/30/2023 1221 Last data filed at 11/30/2023 0430 Gross per 24 hour  Intake --  Output 500 ml  Net -500 ml   Filed Weights   11/06/23 1450 11/18/23 0500  Weight: 56.2 kg 87.2 kg    Examination:  Physical Exam: GEN: NAD, alert, chronically ill appearance, appears older than stated age HEENT: NCAT, PERRL, EOMI, sclera clear, MMM PULM: CTAB w/o wheezes/crackles, normal respiratory effort, on room air with SpO2 100% at rest.   CV: RRR w/o M/G/R GI: abd soft, NTND, NABS, no R/G/M MSK: no peripheral edema, moves all extremities independently NEURO: No focal neurological deficit PSYCH: normal mood/affect Integumentary: No concerning rashes/lesions/wounds noted on exposed skin surfaces    Data Reviewed: I have personally reviewed following labs and imaging studies  CBC: Recent Labs  Lab 11/25/23 0457 11/30/23 0333  WBC 13.4* 7.8  NEUTROABS 9.1*  --   HGB 8.0* 7.1*  HCT 26.2* 23.5*  MCV 89.1 89.7  PLT 115* 93*   Basic Metabolic Panel: Recent Labs  Lab 11/25/23 0457  NA 136  K 4.3  CL 100  CO2 28  GLUCOSE 119*  BUN 27*  CREATININE 0.52*  CALCIUM 8.7*   GFR: Estimated Creatinine Clearance: 111.2 mL/min (A) (by C-G formula based on SCr of 0.52 mg/dL (L)). Liver Function Tests: No results for input(s): "AST", "ALT", "ALKPHOS", "BILITOT", "PROT", "ALBUMIN" in the last 168 hours.  No results for input(s): "LIPASE", "AMYLASE" in the last  168 hours. No results for input(s): "AMMONIA" in the last 168 hours. Coagulation Profile: No results for input(s): "INR", "PROTIME" in the last 168 hours. Cardiac Enzymes: No results for input(s): "CKTOTAL", "CKMB", "CKMBINDEX", "TROPONINI" in the last 168 hours. BNP (last 3 results) No results for input(s): "PROBNP" in the last 8760 hours. HbA1C: No results for input(s): "HGBA1C" in the last 72 hours. CBG: Recent Labs  Lab 11/25/23 1626  GLUCAP 117*   Lipid Profile: No results for input(s): "CHOL", "HDL", "LDLCALC", "TRIG", "CHOLHDL", "LDLDIRECT" in the last 72 hours. Thyroid Function Tests: No results for input(s): "TSH", "T4TOTAL", "FREET4", "T3FREE", "THYROIDAB" in the last 72 hours. Anemia Panel: No results for input(s): "VITAMINB12", "FOLATE", "FERRITIN", "TIBC", "IRON", "RETICCTPCT" in the last 72 hours. Sepsis Labs: Recent Labs  Lab 11/25/23 0457  PROCALCITON 0.41    No results found for this or any previous visit (from the past 240 hours).       Radiology Studies: No results found.      Scheduled Meds:  sodium chloride   Intravenous Once   Chlorhexidine Gluconate Cloth  6 each Topical Daily   lactulose  20 g Oral BID   morphine  115 mg Oral Q8H   OLANZapine  5 mg Oral QHS   pantoprazole  40 mg Oral  Daily   potassium chloride  20 mEq Oral Daily   Ensure Max Protein  11 oz Oral BID   senna  1 tablet Oral BID   sodium chloride flush  10-40 mL Intracatheter Q12H   Continuous Infusions:   LOS: 24 days    Time spent: 48 minutes spent on 11/30/2023 caring for this patient face-to-face including chart review, ordering labs/tests, documenting, discussion with nursing staff, consultants, updating family and interview/physical exam    Alvira Philips [place], DO [facility] Available via Epic secure chat 7am-7pm After these hours, please refer to coverage provider listed on [email or web address] 11/30/2023, 12:21 PM

## 2023-12-01 DIAGNOSIS — G893 Neoplasm related pain (acute) (chronic): Secondary | ICD-10-CM | POA: Diagnosis not present

## 2023-12-01 LAB — CBC
HCT: 27.9 % — ABNORMAL LOW (ref 39.0–52.0)
Hemoglobin: 8.5 g/dL — ABNORMAL LOW (ref 13.0–17.0)
MCH: 27.2 pg (ref 26.0–34.0)
MCHC: 30.5 g/dL (ref 30.0–36.0)
MCV: 89.4 fL (ref 80.0–100.0)
Platelets: 77 10*3/uL — ABNORMAL LOW (ref 150–400)
RBC: 3.12 MIL/uL — ABNORMAL LOW (ref 4.22–5.81)
RDW: 18 % — ABNORMAL HIGH (ref 11.5–15.5)
WBC: 6.9 10*3/uL (ref 4.0–10.5)
nRBC: 8.1 % — ABNORMAL HIGH (ref 0.0–0.2)

## 2023-12-01 LAB — TYPE AND SCREEN
ABO/RH(D): O POS
Antibody Screen: NEGATIVE
Unit division: 0

## 2023-12-01 LAB — BPAM RBC
Blood Product Expiration Date: 202504292359
ISSUE DATE / TIME: 202503311353
Unit Type and Rh: 5100

## 2023-12-01 MED ORDER — SCOPOLAMINE 1 MG/3DAYS TD PT72
1.0000 | MEDICATED_PATCH | TRANSDERMAL | Status: DC
  Administered 2023-12-01: 1.5 mg via TRANSDERMAL
  Filled 2023-12-01: qty 1

## 2023-12-01 MED ORDER — COLLAGENASE 250 UNIT/GM EX OINT
TOPICAL_OINTMENT | Freq: Every day | CUTANEOUS | Status: DC
  Filled 2023-12-01 (×2): qty 30

## 2023-12-01 NOTE — Progress Notes (Signed)
 PROGRESS NOTE    Joseph Haney  QMV:[id number] DOB: 24-Nov-1975 DOA: 11/06/2023 PCP: Frederic Jericho, [name]    Brief Narrative:   Joseph Haney is a 48 y.o. male with past medical history significant for metastatic colon cancer to liver, lung, bone, progressive debility/weakness and bedbound since December 2024 presented to [facility] ED on 11/06/2023 for intractable pain of malignancy.  Palliative care, oncology were consulted.  Patient was requiring high dose opiates and subsequently placed on Dilaudid PCA with MS Contin.  Given his progressive disease process, both palliative care and oncology recommended transitioning to hospice.  Patient and family declined and would like to continue to pursue all aggressive measures.  Dilaudid PCA was titrated off on 11/24/2023.  Pain adequately controlled and currently awaiting SNF bed offers.  Assessment & Plan:   Cancer associated pain Patient presenting to ED with intractable pain of malignancy.  Required high doses of opiates and subsequently placed on Dilaudid PCA.  Palliative care was consulted and followed during the hospital course.  PCA was titrated off on 11/24/2023, and completed steroid taper with dexamethasone. -- MS Contin 115 mg p.o. every 8 hours -- Oxycodone 10 mg p.o. every 4 hours as needed moderate pain -- Lactulose 20 g p.o. 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 Mosetta Putt outpatient.  Patient with repeat imaging studies/PET scans showing progression of disease process.  Seen by Dr. Parke Poisson while inpatient and given his very poor performance status, mobility limitation he is not a candidate for further chemotherapy.  He has completed a short course of palliative radiation during this hospitalization.  Recommends transitioning to hospice but family and patient declined and would like to continue to pursue all aggressive measures  despite oncology making it very clear that he is not a candidate for further chemotherapy at this point. --Outpatient follow-up with medical oncology  Anemia of chronic medical disease Hemoglobin down to 7.1, no overt signs of bleeding; suspect from underlying colon malignancy.  Transfuse 1 unit PRBC on 11/30/2023 with improvement of hemoglobin to 8.5.    Thrombocytopenia Platelet count 93, down from 115 previously.  Will discontinue Lovenox.  Essential hypertension Will discontinue carvedilol due to borderline hypotension. -- Monitor BP closely  GERD -- Protonix 40 mg p.o. daily  Mood disorder -- Olanzapine 5 mg p.o. nightly  Debility/deconditioning/gait disturbance: Has been seen by PT and OT with recommendation of SNF placement.  Unfortunately per [facility], extensive SNF search has resulted in no bed offers and per [facility] home health will be difficult to obtain due to his insurance carrier.  Family has decided to transition to home hospice.  Medically stable for discharge once hospice evaluation complete and equipment arrives at home.   DVT prophylaxis:     Code Status: Limited: Do not attempt resuscitation (DNR) -DNR-LIMITED -Do Not Intubate/DNI  Family Communication: No family present bedside this morning  Disposition Plan:  Level of care: Med-Surg Status is: Inpatient Remains inpatient appropriate because: Anticipate discharge home today with home hospice once equipment arrives    Consultants:  Medical oncology, Dr. Mosetta Putt Palliative care  Procedures:  None  Antimicrobials:  None   Subjective: Patient seen today bedside, lying in bed.  Sister present at bedside.  Patient's pain controlled on current oral pain regimen.  Discussed with TOC and hospice representative, awaiting evaluation this morning and anticipated need of equipment to arrive at home as patient ready for  discharge under hospice at home.  Overall very poor prognosis given his metastatic cancer with significant  progression in which she is not a candidate for any further chemotherapy per medical oncology, Dr. Mosetta Putt. Patient denies headache, no chest pain, no shortness of breath, no abdominal pain, no fever/chills/night sweats, no nausea/vomiting/diarrhea.  No acute events overnight per nursing staff.   Objective: Vitals:   11/30/23 1415 11/30/23 1950 12/01/23 0608 12/01/23 0755  BP: 106/63 118/73 120/76 110/76  Pulse: 98 (!) 103 (!) 117 (!) 112  Resp: 18 18 16 16   Temp: 98.1 F (36.7 C) 98.4 F (36.9 C) 99.5 F (37.5 C) 98.5 F (36.9 C)  TempSrc: Oral Oral Oral Oral  SpO2: 98% 98% 96% 97%  Weight:      Height:        Intake/Output Summary (Last 24 hours) at 12/01/2023 1119 Last data filed at 11/30/2023 1718 Gross per 24 hour  Intake 372.92 ml  Output 500 ml  Net -127.08 ml   Filed Weights   11/06/23 1450 11/18/23 0500  Weight: 56.2 kg 87.2 kg    Examination:  Physical Exam: GEN: NAD, alert, chronically ill appearance, appears older than stated age HEENT: NCAT, PERRL, EOMI, sclera clear, MMM PULM: CTAB w/o wheezes/crackles, normal respiratory effort, on room air with SpO2 96% at rest.   CV: RRR w/o M/G/R GI: abd soft, NTND, NABS, no R/G/M MSK: no peripheral edema, moves all extremities independently NEURO: No focal neurological deficit PSYCH: normal mood/affect Integumentary: No concerning rashes/lesions/wounds noted on exposed skin surfaces    Data Reviewed: I have personally reviewed following labs and imaging studies  CBC: Recent Labs  Lab 11/25/23 0457 11/30/23 0333 11/30/23 1944 12/01/23 0449  WBC 13.4* 7.8  --  6.9  NEUTROABS 9.1*  --   --   --   HGB 8.0* 7.1* 8.7* 8.5*  HCT 26.2* 23.5* 28.2* 27.9*  MCV 89.1 89.7  --  89.4  PLT 115* 93*  --  77*   Basic Metabolic Panel: Recent Labs  Lab 11/25/23 0457  NA 136  K 4.3  CL 100  CO2 28  GLUCOSE 119*  BUN 27*  CREATININE 0.52*  CALCIUM 8.7*   GFR: Estimated Creatinine Clearance: 111.2 mL/min (A) (by C-G  formula based on SCr of 0.52 mg/dL (L)). Liver Function Tests: No results for input(s): "AST", "ALT", "ALKPHOS", "BILITOT", "PROT", "ALBUMIN" in the last 168 hours.  No results for input(s): "LIPASE", "AMYLASE" in the last 168 hours. No results for input(s): "AMMONIA" in the last 168 hours. Coagulation Profile: No results for input(s): "INR", "PROTIME" in the last 168 hours. Cardiac Enzymes: No results for input(s): "CKTOTAL", "CKMB", "CKMBINDEX", "TROPONINI" in the last 168 hours. BNP (last 3 results) No results for input(s): "PROBNP" in the last 8760 hours. HbA1C: No results for input(s): "HGBA1C" in the last 72 hours. CBG: Recent Labs  Lab 11/25/23 1626  GLUCAP 117*   Lipid Profile: No results for input(s): "CHOL", "HDL", "LDLCALC", "TRIG", "CHOLHDL", "LDLDIRECT" in the last 72 hours. Thyroid Function Tests: No results for input(s): "TSH", "T4TOTAL", "FREET4", "T3FREE", "THYROIDAB" in the last 72 hours. Anemia Panel: No results for input(s): "VITAMINB12", "FOLATE", "FERRITIN", "TIBC", "IRON", "RETICCTPCT" in the last 72 hours. Sepsis Labs: Recent Labs  Lab 11/25/23 0457  PROCALCITON 0.41    No results found for this or any previous visit (from the past 240 hours).       Radiology Studies: No results found.      Scheduled Meds:  Chlorhexidine Gluconate  Cloth  6 each Topical Daily   lactulose  20 g Oral BID   morphine  115 mg Oral Q8H   OLANZapine  5 mg Oral QHS   pantoprazole  40 mg Oral Daily   potassium chloride  20 mEq Oral Daily   Ensure Max Protein  11 oz Oral BID   scopolamine  1 patch Transdermal Q72H   senna  1 tablet Oral BID   sodium chloride flush  10-40 mL Intracatheter Q12H   Continuous Infusions:   LOS: 25 days    Time spent: 48 minutes spent on 12/01/2023 caring for this patient face-to-face including chart review, ordering labs/tests, documenting, discussion with nursing staff, consultants, updating family and interview/physical  exam    Alvira Philips [place], DO [facility] Available via Epic secure chat 7am-7pm After these hours, please refer to coverage provider listed on [email or web address] 12/01/2023, 11:19 AM

## 2023-12-01 NOTE — Progress Notes (Signed)
 [facility] Liaison RN note:   Received request from [facility] for hospice services at home after discharge. Chart and patient information under review by Hospice physician.  Spoke with patient and sister Valentina Gu to initiate education related to hospice philosophy, services, and team approach to care. Patient/family verbalized understanding of information given.   Per discussion, the plan is for discharge home by EMS on Thursday after DME delivery.   DME needs discussed. Patient requests delivery of hospital bed, overbed table, air mattress, BSC and sit/stand lift. Address has been verified and is correct in the chart.   Patient's mother and/or sister are the family contact to arrange time of equipment delivery.   Please send signed and completed DNR with patient/family. Please provide prescriptions at discharge as needed for ongoing symptom management.  [facility] information and contact numbers given to [place].   Above information shared with Kenney Houseman. Please call with any hospice related questions or concerns.  Thank you for the opportunity to participate in this patient's care.  Thea Gist, Charity fundraiser, BSN [employer] [phone]

## 2023-12-01 NOTE — Progress Notes (Signed)
 PT Cancellation Note  Patient Details Name: Joseph Haney MRN: [id number] DOB: 04-Jul-1976   Cancelled Treatment:    Reason Eval/Treat Not Completed: Other (comment) (home with hospice). Chart review completed, noted pt and family transitioning to home hospice. Hospice will order appropriate DME for pt. Acute PT will sign off at this time.   Domenick Bookbinder PT, DPT 12/01/23, 12:48 PM

## 2023-12-01 NOTE — Progress Notes (Signed)
 PMT no charge note.   Chart reviewed, my colleague Dr Patterson Hammersmith' note reviewed, medication list noted. No new in patient PMT specific recommendations at this time. Continue current mode of care, agree with home with hospice on discharge.  No charge Rosalin Hawking MD [facility].

## 2023-12-01 NOTE — TOC Progression Note (Addendum)
 Transition of Care [facility]) - Progression Note    Patient Details  Name: Joseph Haney MRN: [id number] Date of Birth: Jan 06, 1976  [facility] CM/SW Contact  Jessie Foot, RN Phone Number: 12/01/2023, 11:00 AM  Clinical Narrative:    NCM spoke with patient and his sister (Lurcinda). Discussed again how home Hospice works. That Hospice will order all equipment needed in home to care for patient. Inform them that someone from hospice would be by to talk with them.  12:30 PM [facility]) visited with patient and family. Per [place], family needs time to prepare for arrive of hospital bed and would be ready by Thursday (4/3).   Barriers to Discharge: Continued Medical Work up  Expected Discharge Plan and Services                                               Social Determinants of Health (SDOH) Interventions SDOH Screenings   Food Insecurity: No Food Insecurity (11/07/2023)  Housing: High Risk (11/07/2023)  Transportation Needs: Unmet Transportation Needs (11/07/2023)  Utilities: Not At Risk (11/07/2023)  Social Connections: Unknown (11/07/2023)  Tobacco Use: Low Risk  (11/08/2023)    Readmission Risk Interventions    11/09/2023    9:44 AM 11/07/2023    2:14 PM  Readmission Risk Prevention Plan  Post Dischage Appt  Complete  Medication Screening  Complete  Transportation Screening Complete Complete  HRI or Home Care Consult Complete   Social Work Consult for Recovery Care Planning/Counseling Complete   Palliative Care Screening Not Applicable   Medication Review Oceanographer) Complete

## 2023-12-01 NOTE — Plan of Care (Signed)

## 2023-12-01 NOTE — Progress Notes (Signed)
 Occupational Therapy Treatment Patient Details Name: Joseph Haney MRN: [id number] DOB: [birth year] Today's Date: 12/01/2023   History of present illness Joseph Haney is a 48 y.o. male admitted with cancer associated pain. MRI 11/11/23 1. Diffuse osseous metastatic disease throughout the lumbar spine, lower thoracic spine, and included posterior pelvis, 2. Slight bulging of the posterior walls of the T12 through L3 vertebral bodies with a small amount of tumor extending into the anterior epidural spaces at these levels, 3. Subtle superior endplate compression fracture of the L1 and L2 vertebral bodies. PMH: colon cancer (s/p partial colectomy 2022), liver and pelvic bone metastasis   OT comments  Patient seen for skilled OT session this am with family bedside for OT patient and family education. OT addressed components outlined below with emphasis on facilitating patient's capabilities with BADL's and mobility using bed features with set up. Sister demonstrated hands on teach back of LE positioning, ROM and UE therex and breathing integration cues after OT education provided. Patient continues to benefit from skilled Acute OT services and as per chart review will be discharge home with family with 24/7 care and assistance, home services, all needed DME as rehab options were exhausted.       If plan is discharge home, recommend the following:  Two people to help with walking and/or transfers;Two people to help with bathing/dressing/bathroom;Direct supervision/assist for medications management;Assist for transportation;Direct supervision/assist for financial management;Help with stairs or ramp for entrance   Equipment Recommendations  BSC/3in1;Tub/shower bench;Hospital bed;Hoyer lift       Precautions / Restrictions Precautions Precautions: Fall Restrictions Weight Bearing Restrictions Per Provider Order: No Other Position/Activity Restrictions: OT worked wth patient and family on positioning  education for pressure reliefs on sacral region and heels as well as reinforced sidelying when possible              ADL either performed or assessed with clinical judgement   ADL Overall ADL's : Needs assistance/impaired     Grooming: Wash/dry hands;Wash/dry face;Oral care;Applying deodorant;Set up;Contact guard assist;Bed level (sister educated on allowing patient to perform what he is able to to maintain strength and progress activity tolerance)                                      Extremity/Trunk Assessment Upper Extremity Assessment Upper Extremity Assessment: Generalized weakness   Lower Extremity Assessment Lower Extremity Assessment: Generalized weakness RLE Deficits / Details: Education with sister on PROM and positioning due to severe B Le weakness        Vision       Perception     Praxis     Communication Communication Communication: No apparent difficulties   Cognition Arousal: Alert Behavior During Therapy: WFL for tasks assessed/performed (patient voiced feelings of overwhelment, OT worked with patient and family on breathing strategies/mindfulness with relaxation in addition to use of light UE tband, foam ball, flutter valve trainer to assist with + results, distraction, + capabilities)                                 Following commands: Impaired Following commands impaired: Follows one step commands with increased time      Cueing   Cueing Techniques: Verbal cues  Exercises Exercises: General Upper Extremity (B UE yellow tband, foam ball grasp, flutter valve trainer 10 reps each)  General Comments no issues with SpO2 throughout session with O2 sats > 98% on RA    Pertinent Vitals/ Pain       Pain Assessment Pain Assessment: Faces Faces Pain Scale: Hurts whole lot Pain Location: back Pain Descriptors / Indicators: Constant, Discomfort Pain Intervention(s): Monitored during session, Repositioned,  Relaxation   Frequency  Min 2X/week        Progress Toward Goals  OT Goals(current goals can now be found in the care plan section)  Progress towards OT goals: Progressing toward goals  Acute Rehab OT Goals Patient Stated Goal: to get home safely and get stronger OT Goal Formulation: With patient/family Time For Goal Achievement: 12/10/23 Potential to Achieve Goals: Fair ADL Goals Pt Will Perform Grooming: with contact guard assist;sitting Pt Will Perform Upper Body Bathing: with contact guard assist;sitting Pt Will Transfer to Toilet: with +2 assist;bedside commode Additional ADL Goal #1: Pt will transition from supine to sitting EOB with Max A in order to participate in self care task while seted EOB and progress OOB activity.   AM-PAC OT "6 Clicks" Daily Activity     Outcome Measure   Help from another person eating meals?: A Little Help from another person taking care of personal grooming?: A Little Help from another person toileting, which includes using toliet, bedpan, or urinal?: A Lot Help from another person bathing (including washing, rinsing, drying)?: Total Help from another person to put on and taking off regular upper body clothing?: A Lot Help from another person to put on and taking off regular lower body clothing?: Total 6 Click Score: 12    End of Session    OT Visit Diagnosis: Unsteadiness on feet (R26.81);Muscle weakness (generalized) (M62.81);Pain   Activity Tolerance Patient limited by fatigue;Patient limited by pain   Patient Left in bed;with call bell/phone within reach;with family/visitor present   Nurse Communication  (bathing status)        Time: 1610-9604 OT Time Calculation (min): 29 min  Charges: OT General Charges $OT Visit: 1 Visit OT Treatments $Self Care/Home Management : 23-37 mins  Joseph Haney OT/L Acute Rehabilitation Department  [phone]  12/01/2023, 10:31 AM

## 2023-12-01 NOTE — Progress Notes (Signed)
 Patients pulse is 117. Patient sleeping. On call Garner Nash, NP. No new orders. Care plan continued.

## 2023-12-01 NOTE — Progress Notes (Signed)
 Sacral dressing change completed.  Pt tolerated well.

## 2023-12-02 DIAGNOSIS — G893 Neoplasm related pain (acute) (chronic): Secondary | ICD-10-CM | POA: Diagnosis not present

## 2023-12-02 NOTE — Plan of Care (Signed)

## 2023-12-02 NOTE — Progress Notes (Signed)
 PROGRESS NOTE    Joseph Haney  QMV:[id number] DOB: 03-29-1976 DOA: 11/06/2023 PCP: Frederic Jericho, [name]    Brief Narrative:   Joseph Haney is a 48 y.o. male with past medical history significant for metastatic colon cancer to liver, lung, bone, progressive debility/weakness and bedbound since December 2024 presented to [facility] ED on 11/06/2023 for intractable pain of malignancy.  Palliative care, oncology were consulted.  Patient was requiring high dose opiates and subsequently placed on Dilaudid PCA with MS Contin.  Given his progressive disease process, both palliative care and oncology recommended transitioning to hospice.  Patient and family declined and would like to continue to pursue all aggressive measures.  Dilaudid PCA was titrated off on 11/24/2023.  Plan home with hospice on 4/3 after DME delivered.  Assessment & Plan:   Cancer associated pain Patient presenting to ED with intractable pain of malignancy.  Required high doses of opiates and subsequently placed on Dilaudid PCA.  Palliative care was consulted and followed during the hospital course.  PCA was titrated off on 11/24/2023, and completed steroid taper with dexamethasone. -- MS Contin 115 mg p.o. every 8 hours -- Oxycodone 10 mg p.o. every 4 hours as needed moderate pain -- Lactulose 20 g p.o. 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 Mosetta Putt outpatient.  Patient with repeat imaging studies/PET scans showing progression of disease process.  Seen by Dr. Parke Poisson while inpatient and given his very poor performance status, mobility limitation he is not a candidate for further chemotherapy.  He has completed a short course of palliative radiation during this hospitalization.  -transition to hospice  Anemia of chronic medical disease Hemoglobin down to 7.1, no overt signs of bleeding; suspect from underlying colon  malignancy.  Transfuse 1 unit PRBC on 11/30/2023 with improvement of hemoglobin to 8.5.    Thrombocytopenia -transition to hospice  Essential hypertension - discontinue carvedilol due to borderline hypotension. -- Monitor BP closely  GERD -- Protonix 40 mg p.o. daily  Mood disorder -- Olanzapine 5 mg p.o. nightly  Debility/deconditioning/gait disturbance: Has been seen by PT and OT with recommendation of SNF placement.  Unfortunately per [facility], extensive SNF search has resulted in no bed offers and per [facility] home health will be difficult to obtain due to his insurance carrier.  Family has decided to transition to home hospice.  Medically stable for discharge once hospice evaluation complete and equipment arrives at home.   DVT prophylaxis:     Code Status: Limited: Do not attempt resuscitation (DNR) -DNR-LIMITED -Do Not Intubate/DNI  Family Communication: sister at bedside  Disposition Plan:  Level of care: Med-Surg Status is: Inpatient Remains inpatient appropriate because: Anticipate discharge home today with home hospice once equipment arrives 4/3    Consultants:  Medical oncology, Dr. Mosetta Putt Palliative care     Subjective: No overnight events   Objective: Vitals:   12/01/23 1637 12/01/23 2037 12/02/23 0014 12/02/23 0603  BP: 116/70 106/68 122/79 106/73  Pulse: (!) 113 (!) 115 (!) 119 (!) 118  Resp: 16 16 16 16   Temp: 99.1 F (37.3 C) 98.6 F (37 C) 99.6 F (37.6 C) 99.1 F (37.3 C)  TempSrc:      SpO2: 100% 98% 99% 98%  Weight:      Height:        Intake/Output Summary (Last 24 hours) at 12/02/2023 1126 Last data filed at 12/02/2023 0800 Gross per  24 hour  Intake 700 ml  Output 675 ml  Net 25 ml   Filed Weights   11/06/23 1450 11/18/23 0500  Weight: 56.2 kg 87.2 kg    Examination:   General: Appearance:    Obese male in no acute distress     Lungs:     respirations unlabored  Heart:    Tachycardic.  MS:   All extremities are intact.    Neurologic:   Awake, alert        Data Reviewed: I have personally reviewed following labs and imaging studies  CBC: Recent Labs  Lab 11/30/23 0333 11/30/23 1944 12/01/23 0449  WBC 7.8  --  6.9  HGB 7.1* 8.7* 8.5*  HCT 23.5* 28.2* 27.9*  MCV 89.7  --  89.4  PLT 93*  --  77*   Basic Metabolic Panel: No results for input(s): "NA", "K", "CL", "CO2", "GLUCOSE", "BUN", "CREATININE", "CALCIUM", "MG", "PHOS" in the last 168 hours.  GFR: Estimated Creatinine Clearance: 111.2 mL/min (A) (by C-G formula based on SCr of 0.52 mg/dL (L)). Liver Function Tests: No results for input(s): "AST", "ALT", "ALKPHOS", "BILITOT", "PROT", "ALBUMIN" in the last 168 hours.  No results for input(s): "LIPASE", "AMYLASE" in the last 168 hours. No results for input(s): "AMMONIA" in the last 168 hours. Coagulation Profile: No results for input(s): "INR", "PROTIME" in the last 168 hours. Cardiac Enzymes: No results for input(s): "CKTOTAL", "CKMB", "CKMBINDEX", "TROPONINI" in the last 168 hours. BNP (last 3 results) No results for input(s): "PROBNP" in the last 8760 hours. HbA1C: No results for input(s): "HGBA1C" in the last 72 hours. CBG: Recent Labs  Lab 11/25/23 1626  GLUCAP 117*   Lipid Profile: No results for input(s): "CHOL", "HDL", "LDLCALC", "TRIG", "CHOLHDL", "LDLDIRECT" in the last 72 hours. Thyroid Function Tests: No results for input(s): "TSH", "T4TOTAL", "FREET4", "T3FREE", "THYROIDAB" in the last 72 hours. Anemia Panel: No results for input(s): "VITAMINB12", "FOLATE", "FERRITIN", "TIBC", "IRON", "RETICCTPCT" in the last 72 hours. Sepsis Labs: No results for input(s): "PROCALCITON", "LATICACIDVEN" in the last 168 hours.   No results found for this or any previous visit (from the past 240 hours).       Radiology Studies: No results found.      Scheduled Meds:  Chlorhexidine Gluconate Cloth  6 each Topical Daily   collagenase   Topical Daily   lactulose  20 g Oral BID    morphine  115 mg Oral Q8H   OLANZapine  5 mg Oral QHS   pantoprazole  40 mg Oral Daily   potassium chloride  20 mEq Oral Daily   Ensure Max Protein  11 oz Oral BID   scopolamine  1 patch Transdermal Q72H   senna  1 tablet Oral BID   sodium chloride flush  10-40 mL Intracatheter Q12H   Continuous Infusions:   LOS: 26 days    Time spent: 48 minutes spent on 12/02/2023 caring for this patient face-to-face including chart review, ordering labs/tests, documenting, discussion with nursing staff, consultants, updating family and interview/physical exam    Joseph Art, DO [facility] Available via Epic secure chat 7am-7pm After these hours, please refer to coverage provider listed on [email or web address] 12/02/2023, 11:26 AM

## 2023-12-02 NOTE — Progress Notes (Signed)
 [facility] Liaison Note   Left voicemail and contacted sister, Valentina Gu to confirm plan for discharge.   Discussed with Valentina Gu, plan for DME delivery tomorrow am and discharge via [facility] afterward. Reviewed DME needed with Lucy.   Please do not hesitate to call with questions.   Glenna Fellows, BSN, RN, [facility] Liaison [phone]

## 2023-12-02 NOTE — Progress Notes (Signed)
 Daily Progress Note   Patient Name: Joseph Haney       Date: 12/02/2023 DOB: 1975-12-08  Age: 48 y.o. MRN#: [id number] Attending Physician: Joseph Art, DO Primary Care Physician: Frederic Jericho, [name] Admit Date: 11/06/2023 Length of Stay: 26 days  Reason for Consultation/Follow-up: Establishing goals of care, Non pain symptom management, and Pain control  Subjective:   CC: Patient seen laying comfortably in bed today.  Palliative medicine team following up regarding symptom management and complex medical decision making.  Subjective:  Reviewed EMR patient resting in bed, appears in no distress.   Objective:   Vital Signs:  BP 106/73 (BP Location: Left Arm)   Pulse (!) 118   Temp 99.1 F (37.3 C)   Resp 16   Ht 5\' 4"  (1.626 m)   Wt 87.2 kg   SpO2 98%   BMI 33.01 kg/m   Physical Exam: General: NAD, alert, cachectic, chronically ill-appearing, frail Cardiovascular: RRR Respiratory: no increased work of breathing noted, not in respiratory distress Neuro: A&Ox4, following commands easily Psych: appropriately answers all questions  Imaging:  I personally reviewed recent imaging.   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  Recent imaging has shown progression of the disease and patient's bone.  Oncology consulted for recommendations.  Palliative medicine team consulted to assist with symptom management.  Recommendations/Plan: # Complex medical decision making/goals of care:  - Discussed care with patient with family present at bedside as detailed above in HPI.  Multiple providers have had multiple conversations about patient's medical status, prognosis, not being a candidate for further cancer directed therapy based on his poor functional and nutritional status, and  discussed possible support moving forward through home hospice.  While patient has focused on desire to "get better", patient has been denied from rehab.  Patient and family now electing to go home with hospice support as per chart review. Palliative medicine team will follow along peripherally.  Please reach out if acute needs arise.  -  Code Status: Limited: Do not attempt resuscitation (DNR) -DNR-LIMITED -Do Not Intubate/DNI   # Symptom management: Patient is receiving these palliative interventions for symptom management with an intent to improve quality of life.   -Pain, acute on chronic in setting of metastatic colon cancer with mets to bone, liver, and lung   -Continue MS Contin to 115 mg every 8 hours.     -Continue oxycodone to 10 mg every 4 hours as needed   -Completed dexamethasone taper   -Already completed Celebrex 200 mg twice daily for total of 10 doses.   Patient receiving Protonix 40 mg daily.    -Completed radiation   -Mood/appetite      -Continue olanzapine to 5 mg nightly   -Constipation   -Continue senna 1 tab twice daily  # Psychosocial Support:  -Brother, mother, son  -Chaplain following for support  # Discharge Planning: TOC note reviewed, hospice liaison note reviewed - likely for discharge on 12-03-23 with home with hospice support via [facility]    Discussed with: IDT  Thank you for allowing the palliative care team to participate in the care [name].  Low MDM Rosalin Hawking MD.  Palliative Care Provider PMT # [phone]  If patient remains symptomatic despite maximum doses, please call PMT at [phone] between 0700 and 1900. Outside of  these hours, please call attending, as PMT does not have night coverage.

## 2023-12-03 ENCOUNTER — Encounter: Payer: Self-pay | Admitting: Hematology

## 2023-12-03 ENCOUNTER — Other Ambulatory Visit (HOSPITAL_COMMUNITY): Payer: Self-pay

## 2023-12-03 ENCOUNTER — Encounter: Payer: Self-pay | Admitting: Nurse Practitioner

## 2023-12-03 DIAGNOSIS — G893 Neoplasm related pain (acute) (chronic): Secondary | ICD-10-CM | POA: Diagnosis not present

## 2023-12-03 MED ORDER — OXYCODONE HCL 10 MG PO TABS
10.0000 mg | ORAL_TABLET | ORAL | 0 refills | Status: AC | PRN
  Filled 2023-12-03: qty 30, 5d supply, fill #0

## 2023-12-03 MED ORDER — OLANZAPINE 5 MG PO TABS
5.0000 mg | ORAL_TABLET | Freq: Every day | ORAL | 0 refills | Status: AC
  Filled 2023-12-03: qty 30, 30d supply, fill #0

## 2023-12-03 MED ORDER — LACTULOSE 10 GM/15ML PO SOLN
20.0000 g | Freq: Two times a day (BID) | ORAL | 0 refills | Status: AC
  Filled 2023-12-03: qty 946, 16d supply, fill #0

## 2023-12-03 MED ORDER — MORPHINE SULFATE ER 30 MG PO TBCR
115.0000 mg | EXTENDED_RELEASE_TABLET | Freq: Three times a day (TID) | ORAL | 0 refills | Status: AC
  Filled 2023-12-03: qty 42, 7d supply, fill #0
  Filled 2023-12-03: qty 180, 30d supply, fill #0
  Filled 2023-12-04 (×2): qty 84, 7d supply, fill #0

## 2023-12-03 MED ORDER — POLYETHYLENE GLYCOL 3350 17 GM/SCOOP PO POWD
17.0000 g | Freq: Every day | ORAL | 0 refills | Status: DC | PRN
  Filled 2023-12-03: qty 238, 14d supply, fill #0

## 2023-12-03 MED ORDER — PROCHLORPERAZINE MALEATE 10 MG PO TABS
10.0000 mg | ORAL_TABLET | Freq: Four times a day (QID) | ORAL | 0 refills | Status: DC | PRN
  Filled 2023-12-03: qty 30, 8d supply, fill #0

## 2023-12-03 MED ORDER — SENNA 8.6 MG PO TABS
1.0000 | ORAL_TABLET | Freq: Two times a day (BID) | ORAL | 0 refills | Status: AC
  Filled 2023-12-03: qty 60, 30d supply, fill #0

## 2023-12-03 MED ORDER — SCOPOLAMINE 1 MG/3DAYS TD PT72
1.0000 | MEDICATED_PATCH | TRANSDERMAL | 0 refills | Status: AC
  Filled 2023-12-03: qty 10, 30d supply, fill #0

## 2023-12-03 MED ORDER — PANTOPRAZOLE SODIUM 40 MG PO TBEC
40.0000 mg | DELAYED_RELEASE_TABLET | Freq: Every day | ORAL | 0 refills | Status: AC
  Filled 2023-12-03: qty 30, 30d supply, fill #0

## 2023-12-03 NOTE — Progress Notes (Signed)
 OT Cancellation Note  Patient Details Name: Joseph Haney MRN: [id number] DOB: 1976-06-23   Cancelled Treatment:    Reason Eval/Treat Not Completed: Other (comment) (Note plan for home with hospice today. Acute OT signing off.)  [facility] 12/03/2023, 7:38 AM Luisa Dago, OT/L   Acute OT Clinical Specialist Acute Rehabilitation Services Pager [phone] Office [phone]

## 2023-12-03 NOTE — Progress Notes (Signed)
 Daily Progress Note   Patient Name: Joseph Haney       Date: 12/03/2023 DOB: Dec 19, 1975  Age: 48 y.o. MRN#: [id number] Attending Physician: Joseph Art, DO Primary Care Physician: Frederic Jericho, [name] Admit Date: 11/06/2023 Length of Stay: 27 days  Reason for Consultation/Follow-up: Establishing goals of care, Non pain symptom management, and Pain control  Subjective:   CC: Patient seen laying comfortably in bed today.  Palliative medicine team following up regarding symptom management and complex medical decision making.  Subjective: No distress, family at bedside.  Reviewed EMR patient resting in bed, appears in no distress.   Objective:   Vital Signs:  BP 114/76 (BP Location: Right Arm)   Pulse (!) 120   Temp 99.5 F (37.5 C) (Oral)   Resp 20   Ht 5\' 4"  (1.626 m)   Wt 87.2 kg   SpO2 99%   BMI 33.01 kg/m   Physical Exam: General: NAD, alert, cachectic, chronically ill-appearing, frail Cardiovascular: RRR Respiratory: no increased work of breathing noted, not in respiratory distress Neuro: A&Ox4, following commands easily Psych: appropriately answers all questions  Imaging:  I personally reviewed recent imaging.   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  Recent imaging has shown progression of the disease and patient's bone.  Oncology consulted for recommendations.  Palliative medicine team consulted to assist with symptom management.  Recommendations/Plan: # Complex medical decision making/goals of care:  - Discussed care with patient with family present at bedside as detailed above in HPI.  Multiple providers have had multiple conversations about patient's medical status, prognosis, not being a candidate for further cancer directed therapy based on his poor  functional and nutritional status, and discussed possible support moving forward through home hospice.  While patient has focused on desire to "get better", patient has been denied from rehab.  Patient and family now electing to go home with hospice support as per chart review. Palliative medicine team will follow along peripherally.  Please reach out if acute needs arise.  -  Code Status: Limited: Do not attempt resuscitation (DNR) -DNR-LIMITED -Do Not Intubate/DNI   # Symptom management: Patient is receiving these palliative interventions for symptom management with an intent to improve quality of life.   -Pain, acute on chronic in setting of metastatic colon cancer with mets to bone, liver, and lung   -Continue MS Contin to 115 mg every 8 hours.     -Continue oxycodone to 10 mg every 4 hours as needed   -Completed dexamethasone taper   -Already completed Celebrex 200 mg twice daily for total of 10 doses.   Patient receiving Protonix 40 mg daily.    -Completed radiation   -Mood/appetite      -Continue olanzapine to 5 mg nightly   -Constipation   -Continue senna 1 tab twice daily  # Psychosocial Support:  -Brother, mother, son  -Chaplain following for support  # Discharge Planning: Discharge today - home with hospice support via [facility]    Discussed with: IDT  Thank you for allowing the palliative care team to participate in the care [name].  Low MDM Rosalin Hawking MD.  Palliative Care Provider PMT # [phone]  If patient remains symptomatic despite maximum doses, please call PMT at [phone] between 0700 and 1900. Outside of these hours, please call attending,  as PMT does not have night coverage.

## 2023-12-03 NOTE — Progress Notes (Signed)
   12/03/23 1000  Spiritual Encounters  Type of Visit Follow up  Care provided to: Pt and family  Referral source Chaplain assessment   I visited with Joseph Haney and his sister for ongoing support and to assess any new needs since deciding to go home with hospice care.  Joseph Haney appreciated the visited,  said his pain was up and down. He seemed peaceful but also received care from his nurse while I was there and needed to ask her some things. He asked if I might return tomorrow and I said I would be here in the afternoon.  Will return and offer ongoing support and time for processing emotions, other concerns. Support offered to family as well and encouragement for their self-care.  [name], M.Div [phone]

## 2023-12-03 NOTE — Plan of Care (Signed)
 Pt discharge to home with home hospice via stretcher picked up by [facility]. Discharge education done at bedside. Pt verbalized understanding of medication regimen and follow up appointment. Wound care done. IV removed. Vitals stable. Foley catheter in place. R chest port cath in place.  Problem: Education: Goal: Knowledge of General Education information will improve Description: Including pain rating scale, medication(s)/side effects and non-pharmacologic comfort measures Outcome: Adequate for Discharge   Problem: Health Behavior/Discharge Planning: Goal: Ability to manage health-related needs will improve Outcome: Adequate for Discharge   Problem: Clinical Measurements: Goal: Ability to maintain clinical measurements within normal limits will improve Outcome: Adequate for Discharge Goal: Will remain free from infection Outcome: Adequate for Discharge Goal: Diagnostic test results will improve Outcome: Adequate for Discharge Goal: Respiratory complications will improve Outcome: Adequate for Discharge Goal: Cardiovascular complication will be avoided Outcome: Adequate for Discharge   Problem: Activity: Goal: Risk for activity intolerance will decrease Outcome: Adequate for Discharge   Problem: Nutrition: Goal: Adequate nutrition will be maintained Outcome: Adequate for Discharge   Problem: Coping: Goal: Level of anxiety will decrease Outcome: Adequate for Discharge   Problem: Elimination: Goal: Will not experience complications related to bowel motility Outcome: Adequate for Discharge Goal: Will not experience complications related to urinary retention Outcome: Adequate for Discharge   Problem: Pain Managment: Goal: General experience of comfort will improve and/or be controlled Outcome: Adequate for Discharge   Problem: Safety: Goal: Ability to remain free from injury will improve Outcome: Adequate for Discharge   Problem: Skin Integrity: Goal: Risk for impaired skin  integrity will decrease Outcome: Adequate for Discharge

## 2023-12-03 NOTE — Discharge Summary (Signed)
 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  Diet recommendation:   Regular.  Wound care: None.  Code status: Full.   History of Present Illness:   Joseph Haney is a 48 y.o. 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  Patient was on CAPOX, also Xtandi which was discontinued due to nausea vomiting and headaches.  Patient was on Xeloda and finished in June 2023.  He was seen at [facility] and started on neoadjuvant chemotherapy and possible surgical resection.  He started chemo in February last year with the FOLFIRI but then patient has not completed his chemo.  He has not follow-up on chemotherapy that was started in September he has had issues and has been canceling it.  At that time he was supposed to be on CAPOX.  Since then he has been  lost to follow-up and has not been taking any medications.  Patient came to the ED today with significant pain.  He is not getting relief.  He is on oxycodone at home.  It is not relieving his pain.  He is also having some nausea.  Patient also reported fatigue and anorexia.  In the ER workup so far is uneventful.  Due to uncontrolled pain patient is being admitted to the hospital for treatment of cancer related pain.    Hospital Course by Problem:   Cancer associated pain Patient presenting to ED with intractable pain of malignancy.  Required high doses of opiates and subsequently placed on Dilaudid PCA.  Palliative care was consulted and followed during the hospital course.  PCA was titrated off on 11/24/2023, and completed steroid taper with dexamethasone. -- MS Contin 115 mg p.o. every 8 hours (hospice to provide) -- Oxycodone 10 mg p.o. every 4 hours as needed moderate pain -- Lactulose 20 g p.o. 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 Mosetta Putt outpatient.  Patient with repeat imaging studies/PET scans showing progression of disease process.  Seen by Dr. Parke Poisson while inpatient and given his very poor performance status, mobility limitation he is not a candidate for further chemotherapy.  He has completed a short course of palliative radiation during this hospitalization.  -transition to hospice   Anemia of chronic medical disease Hemoglobin down to 7.1, no overt signs of bleeding;  suspect from underlying colon malignancy.  Transfuse 1 unit PRBC on 11/30/2023 with improvement of hemoglobin to 8.5.     Thrombocytopenia -transition to hospice   Essential hypertension - discontinue carvedilol due to borderline hypotension. -- Monitor BP closely   GERD -- Protonix 40 mg p.o. daily   Mood disorder -- Olanzapine 5 mg p.o. nightly   Debility/deconditioning/gait disturbance: Has been seen by PT and  OT with recommendation of SNF placement.  Unfortunately per [facility], extensive SNF search has resulted in no bed offers and per [facility] home health will be difficult to obtain due to his insurance carrier.  Family has decided to transition to home hospice.   Medically stable for discharge once hospice evaluation complete and equipment arrives at home.    Medical Consultants:   Oncology Palliative care   Discharge Exam:   Vitals:   12/02/23 2041 12/03/23 0508  BP: 109/64 114/76  Pulse: (!) 118 (!) 120  Resp: 18 20  Temp: 99.5 F (37.5 C) 99.5 F (37.5 C)  SpO2: 98% 99%   Vitals:   12/02/23 1234 12/02/23 1452 12/02/23 2041 12/03/23 0508  BP: 106/65 115/80 109/64 114/76  Pulse: (!) 115 (!) 121 (!) 118 (!) 120  Resp: 14 17 18 20   Temp: 98.3 F (36.8 C) 98.7 F (37.1 C) 99.5 F (37.5 C) 99.5 F (37.5 C)  TempSrc:  Oral Oral Oral  SpO2: 100% 100% 98% 99%  Weight:      Height:        General exam: Appears calm and comfortable.    The results of significant diagnostics from this hospitalization (including imaging, microbiology, ancillary and laboratory) are listed below for reference.     Procedures and Diagnostic Studies:   No results found.   Labs:   Basic Metabolic Panel: No results for input(s): "NA", "K", "CL", "CO2", "GLUCOSE", "BUN", "CREATININE", "CALCIUM", "MG", "PHOS" in the last 168 hours. GFR Estimated Creatinine Clearance: 111.2 mL/min (A) (by C-G formula based on SCr of 0.52 mg/dL (L)). Liver Function Tests: No results for input(s): "AST", "ALT", "ALKPHOS", "BILITOT", "PROT", "ALBUMIN" in the last 168 hours. No results for input(s): "LIPASE", "AMYLASE" in the last 168 hours. No results for input(s): "AMMONIA" in the last 168 hours. Coagulation profile No results for input(s): "INR", "PROTIME" in the last 168 hours.  CBC: Recent Labs  Lab 11/30/23 0333 11/30/23 1944 12/01/23 0449  WBC 7.8  --  6.9  HGB 7.1* 8.7* 8.5*  HCT 23.5* 28.2* 27.9*  MCV  89.7  --  89.4  PLT 93*  --  77*   Cardiac Enzymes: No results for input(s): "CKTOTAL", "CKMB", "CKMBINDEX", "TROPONINI" in the last 168 hours. BNP: Invalid input(s): "POCBNP" CBG: No results for input(s): "GLUCAP" in the last 168 hours. D-Dimer No results for input(s): "DDIMER" in the last 72 hours. Hgb A1c No results for input(s): "HGBA1C" in the last 72 hours. Lipid Profile No results for input(s): "CHOL", "HDL", "LDLCALC", "TRIG", "CHOLHDL", "LDLDIRECT" in the last 72 hours. Thyroid function studies No results for input(s): "TSH", "T4TOTAL", "T3FREE", "THYROIDAB" in the last 72 hours.  Invalid input(s): "FREET3" Anemia work up No results for input(s): "VITAMINB12", "FOLATE", "FERRITIN", "TIBC", "IRON", "RETICCTPCT" in the last 72 hours. Microbiology No results found for this or any previous visit (from the past 240 hours).   Discharge Instructions:   Discharge Instructions     Diet general   Complete by: As directed    Increase activity slowly   Complete by: As directed  No wound care   Complete by: As directed       Allergies as of 12/03/2023       Reactions   Latex Swelling, Other (See Comments)   Pain and swelling of penis after urinary catheter placed last surgery.        Medication List     STOP taking these medications    acetaminophen 650 MG CR tablet Commonly known as: Tylenol 8 Hour   capecitabine 500 MG tablet Commonly known as: XELODA   carvedilol 25 MG tablet Commonly known as: COREG   dexamethasone 4 MG tablet Commonly known as: DECADRON   fentaNYL 50 MCG/HR Commonly known as: DURAGESIC   gabapentin 300 MG capsule Commonly known as: NEURONTIN   HYDROcodone-acetaminophen 5-325 MG tablet Commonly known as: NORCO/VICODIN   lidocaine-prilocaine cream Commonly known as: EMLA   magic mouthwash (nystatin, lidocaine, diphenhydrAMINE, alum & mag hydroxide) suspension   megestrol 400 MG/10ML suspension Commonly known as: MEGACE    promethazine 25 MG tablet Commonly known as: PHENERGAN       TAKE these medications    diphenoxylate-atropine 2.5-0.025 MG tablet Commonly known as: LOMOTIL Take 1 tablet by mouth 4 (four) times daily as needed for diarrhea or loose stools. What changed: when to take this   lactulose 10 GM/15ML solution Commonly known as: CHRONULAC Take 30 mLs (20 g total) by mouth 2 (two) times daily.   morphine 60 MG 12 hr tablet Commonly known as: MS CONTIN Take 2 tablets (120 mg total) by mouth every 8 (eight) hours. What changed:  medication strength how much to take when to take this reasons to take this   OLANZapine 5 MG tablet Commonly known as: ZYPREXA Take 1 tablet (5 mg total) by mouth at bedtime.   ondansetron 8 MG tablet Commonly known as: Zofran Take 1 tablet (8 mg total) by mouth every 8 (eight) hours as needed for nausea or vomiting. Start on the third day after chemotherapy.   Oxycodone HCl 10 MG Tabs Take 1 tablet (10 mg total) by mouth every 4 (four) hours as needed for moderate pain (pain score 4-6) or severe pain (pain score 7-10). What changed:  medication strength how much to take reasons to take this   pantoprazole 40 MG tablet Commonly known as: PROTONIX Take 1 tablet (40 mg total) by mouth daily.   polyethylene glycol 17 g packet Commonly known as: MIRALAX / GLYCOLAX Take 17 g by mouth daily as needed for mild constipation or moderate constipation.   prochlorperazine 10 MG tablet Commonly known as: COMPAZINE Take 1 tablet (10 mg total) by mouth every 6 (six) hours as needed for nausea or vomiting.   scopolamine 1 MG/3DAYS Commonly known as: TRANSDERM-SCOP Place 1 patch (1.5 mg total) onto the skin every 3 (three) days. Start taking on: December 04, 2023   senna 8.6 MG Tabs tablet Commonly known as: SENOKOT Take 1 tablet (8.6 mg total) by mouth 2 (two) times daily.               Durable Medical Equipment  (From admission, onward)            Start     Ordered   11/30/23 1043  For home use only DME Hospital bed  Once       Question Answer Comment  Length of Need Lifetime   The above medical condition requires: Patient requires the ability to reposition frequently   Head must be elevated greater than: 30 degrees  Bed type Semi-electric   Support Surface: Low Air loss Mattress      11/30/23 1042            Follow-up Information     Frederic Jericho, [name]. Schedule an appointment as soon as possible for a visit in 1 week(s).   Specialty: Physician Assistant Contact information: [address] [place] [address] [phone]         Malachy Mood, MD. Schedule an appointment as soon as possible for a visit.   Specialties: Hematology, Oncology Contact information: [address] [place] [address] [phone]                  Time coordinating discharge: 45 min  Signed:  Joseph Art DO  [facility] 12/03/2023, 8:03 AM

## 2023-12-03 NOTE — Progress Notes (Signed)
 TOC meds delivered from outpatient [facility] pharmacy by this RN

## 2023-12-03 NOTE — TOC Transition Note (Signed)
 Transition of Care [facility]) - Discharge Note   Patient Details  Name: Joseph Haney MRN: [id number] Date of Birth: 1975/10/12  Transition of Care [facility]) CM/SW Contact:  Jessie Foot, RN Phone Number: 12/03/2023, 1:05 PM   Clinical Narrative:  Patient will DC to: Home with [facility]  Family notified: Lurcinda (sister) Transport by: Sharin Mons   Per MD patient ready for discharge home with [facility]. Shawn with [employer] [facility] said all DME equipment has been delivered to patients home.  RN, patient, and patient's family, notified of DC.  DC packet on chart includes medical necessary, face sheet, and signed DNR form.  Ambulance transport requested for patient. [name]      NCM will sign off for now.  Please consult [place] again if new needs arise   Final next level of care: Home w Hospice Care Barriers to Discharge: Barriers Unresolved (comment)   Patient Goals and CMS Choice            Discharge Placement                  Name of family member notified: Lurcinda Patient and family notified of of transfer: 12/03/23  Discharge Plan and Services Additional resources added to the After Visit Summary for                  DME Arranged: N/A DME Agency: NA         HH Agency: NA        Social Drivers of Health (SDOH) Interventions SDOH Screenings   Food Insecurity: No Food Insecurity (11/07/2023)  Housing: High Risk (11/07/2023)  Transportation Needs: Unmet Transportation Needs (11/07/2023)  Utilities: Not At Risk (11/07/2023)  Social Connections: Unknown (11/07/2023)  Tobacco Use: Low Risk  (11/08/2023)     Readmission Risk Interventions    11/09/2023    9:44 AM 11/07/2023    2:14 PM  Readmission Risk Prevention Plan  Post Dischage Appt  Complete  Medication Screening  Complete  Transportation Screening Complete Complete  HRI or Home Care Consult Complete   Social Work Consult for Recovery Care Planning/Counseling Complete   Palliative Care Screening Not  Applicable   Medication Review Oceanographer) Complete

## 2023-12-04 ENCOUNTER — Other Ambulatory Visit (HOSPITAL_COMMUNITY): Payer: Self-pay

## 2023-12-04 ENCOUNTER — Encounter: Payer: Self-pay | Admitting: Hematology

## 2023-12-04 ENCOUNTER — Encounter: Payer: Self-pay | Admitting: Nurse Practitioner

## 2023-12-11 NOTE — Progress Notes (Addendum)
  Radiation Oncology         [phone]) [phone] ________________________________  Name: Joseph Haney MRN: [id number]  Date of Service: 12/11/2023  DOB: 1975/11/21  Post Treatment Telephone Note  Diagnosis:  C79.51 Secondary malignant neoplasm of bone (as documented in provider EOT note)  The patient was not available for call today. I spoke w/ [name] at patient's bedside.   She reports as follows...  Severe cough that interrupts his sleep. Guaifenesin is not helping. Med dropper is no longer effective to deliver medication due to jaw clinching. Medication runs out of lips. Patient has stopped eating/drinking and seems to be progressing into the final stages of transition.   Patient is on at-home hospice. Patient/ family are unable to answer the rest of the Post Treat questions due to patient's current state of transition.  The patient is not scheduled for ongoing care with Dr. Mosetta Putt in medical oncology. The family was encouraged to call w/ any questions/ concerns.  This concludes the interaction.  Ruel Favors, LPN

## 2023-12-15 ENCOUNTER — Ambulatory Visit
Admit: 2023-12-15 | Discharge: 2023-12-15 | Disposition: A | Discharge status: Home / Self Care | Attending: Hematology | Admitting: Hematology

## 2023-12-16 ENCOUNTER — Other Ambulatory Visit: Payer: Self-pay

## 2023-12-31 DEATH — deceased

## 2024-01-28 ENCOUNTER — Telehealth: Payer: Self-pay

## 2024-01-28 DIAGNOSIS — Z7189 Other specified counseling: Secondary | ICD-10-CM

## 2024-01-28 NOTE — Progress Notes (Signed)
 Complex Care Management Note Care Guide Note  01/28/2024 Name: Joseph Haney MRN: [id number] DOB: 1976-05-21   Complex Care Management Outreach Attempts: An unsuccessful telephone outreach was attempted today to offer the patient information about available complex care management services.  Follow Up Plan:  No further outreach attempts will be made at this time. We have been unable to contact the patient to offer or enroll patient in complex care management services.  Encounter Outcome:  Patient Deceased Dec 20, 2023 DOD  [facility] Guide  Direct Dial: [phone]  Fax [phone]
